# Patient Record
Sex: Male | Born: 1965
Health system: Southern US, Community
[De-identification: ages and names within clinical notes are randomized; demographics above are authoritative.]

## PROBLEM LIST (undated history)

## (undated) DIAGNOSIS — I10 Essential (primary) hypertension: Secondary | ICD-10-CM

## (undated) DIAGNOSIS — Z09 Encounter for follow-up examination after completed treatment for conditions other than malignant neoplasm: Secondary | ICD-10-CM

## (undated) DIAGNOSIS — K222 Esophageal obstruction: Secondary | ICD-10-CM

## (undated) DIAGNOSIS — K635 Polyp of colon: Secondary | ICD-10-CM

## (undated) DIAGNOSIS — K219 Gastro-esophageal reflux disease without esophagitis: Secondary | ICD-10-CM

## (undated) DIAGNOSIS — Z8719 Personal history of other diseases of the digestive system: Secondary | ICD-10-CM

## (undated) DIAGNOSIS — T7840XA Allergy, unspecified, initial encounter: Secondary | ICD-10-CM

## (undated) DIAGNOSIS — E785 Hyperlipidemia, unspecified: Secondary | ICD-10-CM

## (undated) DIAGNOSIS — Z9889 Other specified postprocedural states: Secondary | ICD-10-CM

## (undated) HISTORY — DX: Essential (primary) hypertension: I10

## (undated) HISTORY — DX: Encounter for follow-up examination after completed treatment for conditions other than malignant neoplasm: Z09

## (undated) HISTORY — DX: Gastro-esophageal reflux disease without esophagitis: K21.9

## (undated) HISTORY — DX: Allergy, unspecified, initial encounter: T78.40XA

## (undated) HISTORY — PX: NECK SURGERY: SHX720

## (undated) HISTORY — DX: Hyperlipidemia, unspecified: E78.5

## (undated) HISTORY — DX: Personal history of other diseases of the digestive system: Z87.19

## (undated) HISTORY — DX: Polyp of colon: K63.5

## (undated) HISTORY — PX: POLYPECTOMY: SHX149

## (undated) HISTORY — PX: SHOULDER SURGERY: SHX246

## (undated) HISTORY — PX: COLONOSCOPY: SHX174

## (undated) HISTORY — PX: UPPER GASTROINTESTINAL ENDOSCOPY: SHX188

## (undated) HISTORY — DX: Other specified postprocedural states: Z98.890

## (undated) HISTORY — PX: HERNIA REPAIR: SHX51

## (undated) HISTORY — DX: Esophageal obstruction: K22.2

## (undated) HISTORY — PX: EYE SURGERY: SHX253

## (undated) HISTORY — PX: CATARACT EXTRACTION: SUR2

---

## 1986-08-20 HISTORY — PX: INNER EAR SURGERY: SHX679

## 2003-06-17 LAB — HM DIABETES EYE EXAM

## 2008-08-20 DIAGNOSIS — K635 Polyp of colon: Secondary | ICD-10-CM

## 2008-08-20 HISTORY — DX: Polyp of colon: K63.5

## 2008-11-24 ENCOUNTER — Encounter: Payer: Self-pay | Admitting: Family Medicine

## 2008-12-22 ENCOUNTER — Encounter: Payer: Self-pay | Admitting: Family Medicine

## 2008-12-24 ENCOUNTER — Encounter: Payer: Self-pay | Admitting: Gastroenterology

## 2009-01-06 ENCOUNTER — Ambulatory Visit: Payer: Self-pay | Admitting: Family Medicine

## 2009-01-06 DIAGNOSIS — E1169 Type 2 diabetes mellitus with other specified complication: Secondary | ICD-10-CM | POA: Insufficient documentation

## 2009-01-06 DIAGNOSIS — I10 Essential (primary) hypertension: Secondary | ICD-10-CM | POA: Insufficient documentation

## 2009-01-06 DIAGNOSIS — J309 Allergic rhinitis, unspecified: Secondary | ICD-10-CM | POA: Insufficient documentation

## 2009-01-06 DIAGNOSIS — E785 Hyperlipidemia, unspecified: Secondary | ICD-10-CM

## 2009-01-19 ENCOUNTER — Encounter: Payer: Self-pay | Admitting: Family Medicine

## 2009-01-19 ENCOUNTER — Encounter: Admission: RE | Admit: 2009-01-19 | Discharge: 2009-01-19 | Payer: Self-pay | Admitting: Family Medicine

## 2009-02-11 ENCOUNTER — Ambulatory Visit: Payer: Self-pay | Admitting: Family Medicine

## 2009-03-31 ENCOUNTER — Ambulatory Visit: Payer: Self-pay | Admitting: Gastroenterology

## 2009-03-31 DIAGNOSIS — K219 Gastro-esophageal reflux disease without esophagitis: Secondary | ICD-10-CM | POA: Insufficient documentation

## 2009-03-31 DIAGNOSIS — R1319 Other dysphagia: Secondary | ICD-10-CM | POA: Insufficient documentation

## 2009-04-05 ENCOUNTER — Encounter: Payer: Self-pay | Admitting: Family Medicine

## 2009-04-18 ENCOUNTER — Ambulatory Visit: Payer: Self-pay | Admitting: Family Medicine

## 2009-04-20 HISTORY — PX: ESOPHAGOGASTRODUODENOSCOPY: SHX1529

## 2009-04-20 LAB — HM DIABETES EYE EXAM: HM Diabetic Eye Exam: NORMAL

## 2009-04-21 ENCOUNTER — Ambulatory Visit: Payer: Self-pay | Admitting: Family Medicine

## 2009-04-22 ENCOUNTER — Encounter: Payer: Self-pay | Admitting: Family Medicine

## 2009-04-26 ENCOUNTER — Ambulatory Visit: Payer: Self-pay | Admitting: Family Medicine

## 2009-04-28 ENCOUNTER — Ambulatory Visit: Payer: Self-pay | Admitting: Gastroenterology

## 2009-04-28 ENCOUNTER — Encounter: Payer: Self-pay | Admitting: Gastroenterology

## 2009-04-29 ENCOUNTER — Encounter (INDEPENDENT_AMBULATORY_CARE_PROVIDER_SITE_OTHER): Payer: Self-pay

## 2009-04-29 ENCOUNTER — Encounter: Payer: Self-pay | Admitting: Gastroenterology

## 2009-05-31 ENCOUNTER — Encounter: Payer: Self-pay | Admitting: Family Medicine

## 2009-05-31 ENCOUNTER — Ambulatory Visit: Payer: Self-pay | Admitting: Gastroenterology

## 2009-06-27 ENCOUNTER — Ambulatory Visit: Payer: Self-pay | Admitting: Gastroenterology

## 2009-06-27 DIAGNOSIS — K222 Esophageal obstruction: Secondary | ICD-10-CM | POA: Insufficient documentation

## 2009-07-08 ENCOUNTER — Ambulatory Visit: Payer: Self-pay | Admitting: Family Medicine

## 2009-07-09 LAB — CONVERTED CEMR LAB
ALT: 29 units/L (ref 0–53)
AST: 28 units/L (ref 0–37)
Albumin: 4.1 g/dL (ref 3.5–5.2)
Glucose, Bld: 204 mg/dL — ABNORMAL HIGH (ref 70–99)
HDL: 56.6 mg/dL (ref >39.00)
Phosphorus: 3.2 mg/dL (ref 2.3–4.6)
Sodium: 138 meq/L (ref 135–145)
Triglycerides: 74 mg/dL (ref 0.0–149.0)

## 2009-07-13 ENCOUNTER — Ambulatory Visit: Payer: Self-pay | Admitting: Family Medicine

## 2009-07-13 LAB — HM DIABETES FOOT EXAM

## 2009-10-07 ENCOUNTER — Ambulatory Visit: Payer: Self-pay | Admitting: Family Medicine

## 2009-10-07 DIAGNOSIS — L0211 Cutaneous abscess of neck: Secondary | ICD-10-CM | POA: Insufficient documentation

## 2009-10-07 DIAGNOSIS — D487 Neoplasm of uncertain behavior of other specified sites: Secondary | ICD-10-CM | POA: Insufficient documentation

## 2009-10-07 DIAGNOSIS — L03221 Cellulitis of neck: Secondary | ICD-10-CM

## 2009-10-11 ENCOUNTER — Ambulatory Visit: Payer: Self-pay | Admitting: Family Medicine

## 2009-10-12 ENCOUNTER — Encounter: Payer: Self-pay | Admitting: Family Medicine

## 2009-10-13 ENCOUNTER — Ambulatory Visit: Payer: Self-pay | Admitting: Family Medicine

## 2009-10-14 ENCOUNTER — Encounter: Payer: Self-pay | Admitting: Family Medicine

## 2009-10-17 ENCOUNTER — Ambulatory Visit: Payer: Self-pay | Admitting: Family Medicine

## 2009-11-01 ENCOUNTER — Encounter: Payer: Self-pay | Admitting: Family Medicine

## 2009-11-09 ENCOUNTER — Encounter: Payer: Self-pay | Admitting: Family Medicine

## 2009-11-09 ENCOUNTER — Ambulatory Visit: Payer: Self-pay | Admitting: Family Medicine

## 2009-11-09 DIAGNOSIS — M79609 Pain in unspecified limb: Secondary | ICD-10-CM | POA: Insufficient documentation

## 2009-11-09 DIAGNOSIS — M722 Plantar fascial fibromatosis: Secondary | ICD-10-CM | POA: Insufficient documentation

## 2009-11-21 ENCOUNTER — Telehealth: Payer: Self-pay | Admitting: Gastroenterology

## 2009-11-22 ENCOUNTER — Ambulatory Visit: Payer: Self-pay | Admitting: Family Medicine

## 2009-12-26 ENCOUNTER — Ambulatory Visit: Payer: Self-pay | Admitting: Family Medicine

## 2010-02-15 ENCOUNTER — Encounter: Payer: Self-pay | Admitting: Family Medicine

## 2010-04-05 ENCOUNTER — Telehealth (INDEPENDENT_AMBULATORY_CARE_PROVIDER_SITE_OTHER): Payer: Self-pay | Admitting: *Deleted

## 2010-04-25 ENCOUNTER — Encounter (INDEPENDENT_AMBULATORY_CARE_PROVIDER_SITE_OTHER): Payer: Self-pay | Admitting: *Deleted

## 2010-06-14 ENCOUNTER — Encounter: Payer: Self-pay | Admitting: Family Medicine

## 2010-06-20 ENCOUNTER — Encounter: Payer: Self-pay | Admitting: Family Medicine

## 2010-09-13 ENCOUNTER — Telehealth: Payer: Self-pay | Admitting: Family Medicine

## 2010-09-19 NOTE — Assessment & Plan Note (Signed)
Summary: L FOOT PAIN/CLE   Vital Signs:  Patient profile:   45 year old male Height:      68.75 inches Weight:      228.0 pounds BMI:     34.04 Temp:     98.3 degrees F oral Pulse rate:   80 / minute Pulse rhythm:   regular BP sitting:   130 / 80  (left arm) Cuff size:   large  Vitals Entered By: Benny Lennert CMA Duncan Dull) (November 09, 2009 12:17 PM)  History of Present Illness: Chief complaint hurt left ankle last night  PF partial rupture  pleasant 45 year old gentleman who yesterday forcefully inverted his foot while running and playing softball. He was attempting to play the game, and then he was on his toe and felt a ripping popping sensation and some burning pain. He had had pain in his heel and had some significant difficulty in ability to walk. He has minimal swelling medially posteriorly.  REVIEW OF SYSTEMS  GEN: No systemic complaints, no fevers, chills, sweats, or other acute illnesses MSK: Detailed in the HPI GI: tolerating PO intake without difficulty Neuro: No numbness, parasthesias, or tingling associated. Otherwise the pertinent positives of the ROS are noted above.     GEN: Well-developed,well-nourished,in no acute distress; alert,appropriate and cooperative throughout examination HEENT: Normocephalic and atraumatic without obvious abnormalities. No apparent alopecia or balding. Ears, externally no deformities PULM: Breathing comfortably in no respiratory distress EXT: No clubbing, cyanosis, or edema PSYCH: Normally interactive. Cooperative during the interview. Pleasant. Friendly and conversant. Not anxious or depressed appearing. Normal, full affect.    foot and ankle exam: Bilateral There some mild swelling medially just anterior to the calcaneus. Nontender at bilateral malleoli, entire forefoot, nontender at the navicular, nontender at the fifth metatarsal, nontender and cuboid.  Stable anterior border and subtalar tilt test bilaterally. Strength is  preserved throughout.  Lung the medial border of the plantar fascia in the posterior aspect there is exquisite pain and swelling on palpation. There is also pain at the insertion at the calcaneus.  Allergies: 1)  Codeine  Past History:  Past medical, surgical, family and social histories (including risk factors) reviewed, and no changes noted (except as noted below).  Past Medical History: Reviewed history from 06/27/2009 and no changes required. Allergic rhinitis Diabetes mellitus, type II Hyperlipidemia Hypertension GERD Hyperplastic Polyp 2010 Esophageal stricture  GI- Russella Dar  Past Surgical History: Reviewed history from 06/27/2009 and no changes required. hernia repair as a baby  surgery on R ear - for drainage   EGD 9/10 stricture/ gerd colonoscopy polyp 9/10 - re check 5 years   ENT Dr Haroldine Laws   Family History: Reviewed history from 03/31/2009 and no changes required. Family History Breast cancer  (other relative) Cousin Family History of Colon CA 1st degree relative (father) dx'd age 18 Family History Diabetes (other relative, parent) Family History Hypertension Family History Kidney disease (other relative) Family History Ovarian cancer (other relative) Family History of Heart Disease (Grandparent)  Brother with colon polyps mother DM Maunt DM neice DM  cousin M breast cancer   Social History: Reviewed history from 03/31/2009 and no changes required. Occupation: Arts administrator Single Never Smoked Alcohol use-yes- 6 pk evey 3 days  Drug use-no Regular exercise-yes Daily Caffeine Use 1 cup/day   Impression & Recommendations:  Problem # 1:  FOOT PAIN, LEFT (ICD-729.5) Assessment New XR, 3 view, foot series Indication: foot pain Findings: no evidence of acute fracture or dislocation. 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 Additionally there is a calcaneal spur.  I suspect probable partial plantar fascia rupture. The patient was placed in a post-operative shoe and is going to be partial weightbearing on crutches  f/u 3 weeks  Orders: Radiology other (Radiology Other)  Problem # 2:  PLANTAR FASCIITIS, TRAUMATIC (ICD-728.71) partial rupture  His updated medication list for this problem includes:    Naproxen 500 Mg Tabs (Naproxen) .[name]... 1 by mouth two times a day as needed with food  Complete Medication List: 1)  Lipitor 40 Mg Tabs (Atorvastatin calcium) .... Take 1 tablet by mouth once a day 2)  Benicar 40 Mg Tabs (Olmesartan medoxomil) .... Take 1 tablet by mouth once a day 3)  Lantus 100 Unit/ml Soln (Insulin glargine) .... 30-35 units once daily in morning 4)  Naproxen 500 Mg Tabs (Naproxen) .[name].. 1 by mouth two times a day as needed with food 5)  Flexeril 10 Mg Tabs (Cyclobenzaprine hcl) .[name].. 1 by mouth up to three times a day as needed muscle spasm/ body aches 6)  Cialis 20 Mg Tabs (Tadalafil) .... By mouth as directed as needed 7)  Victoza 0.6 Mg/83ml Soln (liraglutide)  .... Inject sq 1.8 mg once daily 8)  Actos 45 Mg Tabs (Pioglitazone hcl) .... Take 1 tablet by mouth once a day 9)  Metformin Hcl 1000 Mg Tabs (Metformin hcl) .... Take 1 tablet by mouth two times a day 10)  Prilosec 40 Mg Cpdr (Omeprazole) .... Take 1 tablet by mouth every morning 30 minutes before breakfast meal. 11)  Flonase 50 Mcg/act Susp (Fluticasone propionate) .... 2 sprays in each nostril once daily as needed 12)  Vicodin 5-500 Mg Tabs (Hydrocodone-acetaminophen) .[name].. 1 by mouth up to every 4-6 hours as needed pain  warn- this may sedate  Patient Instructions: 1)  f/u 3 weeks Prescriptions: VICODIN 5-500 MG TABS (HYDROCODONE-ACETAMINOPHEN) 1 by mouth up to every 4-6 hours as needed pain  warn- this may sedate  #30 x 0   Entered and Authorized by:   Hannah Beat MD   Signed by:    Hannah Beat MD on 11/09/2009   Method used:   Print then Give to Patient   RxID:   [id number]   Current Allergies (reviewed today): CODEINE

## 2010-09-19 NOTE — Letter (Signed)
Summary: Dr.Michael Altheimer,[facility],Note &   Dr.Michael Altheimer,[facility],Note & Blood Sugar Readings   Imported By: Beau Fanny 06/22/2010 08:58:33  _____________________________________________________________________  External Attachment:    Type:   Image     Comment:   External Document  Appended Document: Dr.Michael Altheimer,[facility],Note &     Clinical Lists Changes  Observations: Added new observation of FLU VAX: given (06/20/2010 21:16)       Preventive Care Screening  Last Flu Shot:    Date:  06/20/2010    Results:  given

## 2010-09-19 NOTE — Letter (Signed)
Summary: Out of Work  [facility]  [address] [place], [place] [address]   Phone: [phone]  Fax: [phone]    November 09, 2009   Employee:  Kairon Popov    To Whom It May Concern:   For Medical reasons, please excuse the above named employee from work for the following dates:  Start:   11/09/2009  End:   11/16/2009  If you need additional information, please feel free to contact our office.         Sincerely,    Hannah Beat MD

## 2010-09-19 NOTE — Letter (Signed)
Summary: [facility]  [facility]   Imported By: Lanelle Bal 03/02/2010 10:28:45  _____________________________________________________________________  External Attachment:    Type:   Image     Comment:   External Document

## 2010-09-19 NOTE — Assessment & Plan Note (Signed)
Summary: EARLY NEXT WEEK FOLLOW UP PER DR Laveda Demedeiros/[facility]   Vital Signs:  Patient profile:   45 year old male Height:      68.75 inches Weight:      232 pounds BMI:     34.63 Temp:     99.5 degrees F oral Pulse rate:   88 / minute Pulse rhythm:   regular BP sitting:   120 / 84  (left arm) Cuff size:   large  Vitals Entered By: Lewanda Rife LPN (October 11, 2009 10:15 AM)  History of Present Illness: put on septra for abcess on neck at last visit   is not improved much and really sore   no fever   may be a little softer - but overall still feels firm  no drainage wishes he could pick the scab off of it   Allergies: 1)  Codeine  Past History:  Past Medical History: Last updated: 06/27/2009 Allergic rhinitis Diabetes mellitus, type II Hyperlipidemia Hypertension GERD Hyperplastic Polyp 2010 Esophageal stricture  GI- Russella Dar  Past Surgical History: Last updated: 06/27/2009 hernia repair as a baby  surgery on R ear - for drainage   EGD 9/10 stricture/ gerd colonoscopy polyp 9/10 - re check 5 years   ENT Dr Haroldine Laws   Family History: Last updated: 03/31/2009 Family History Breast cancer  (other relative) Cousin Family History of Colon CA 1st degree relative (father) dx'd age 64 Family History Diabetes (other relative, parent) Family History Hypertension Family History Kidney disease (other relative) Family History Ovarian cancer (other relative) Family History of Heart Disease (Grandparent)  Brother with colon polyps mother DM Maunt DM neice DM  cousin M breast cancer   Social History: Last updated: 03/31/2009 Occupation: Arts administrator Single Never Smoked Alcohol use-yes- 6 pk evey 3 days  Drug use-no Regular exercise-yes Daily Caffeine Use 1 cup/day  Risk Factors: Exercise: yes (01/06/2009)  Risk Factors: Smoking Status: never (01/06/2009)  Review of Systems General:  Denies chills, fatigue, fever, loss of appetite, and malaise. Eyes:   Denies blurring and eye irritation. ENT:  Denies sore throat. CV:  Denies chest pain or discomfort and palpitations. Resp:  Denies cough. GI:  Denies abdominal pain, nausea, and vomiting. Derm:  Complains of lesion(s); denies itching. Neuro:  Denies headaches, numbness, and tingling. Heme:  Denies abnormal bruising and bleeding.  Physical Exam  General:  Well-developed,well-nourished,in no acute distress; alert,appropriate and cooperative throughout examination Head:  normocephalic, atraumatic, and no abnormalities observed.  Eyes:  vision grossly intact, pupils equal, pupils round, and pupils reactive to light.   Neck:  No deformities, masses, or tenderness noted. Heart:  Normal rate and regular rhythm. S1 and S2 normal without gallop, murmur, click, rub or other extra sounds. Skin:  1.5 cm abcess back of neck- not much change from prev exam -- is still quite firm (very slt softer) scab unroofed and scant amt of pus expressed  erythema is unchanged  tender to touch  Cervical Nodes:  No lymphadenopathy noted Psych:  normal affect, talkative and pleasant    Impression & Recommendations:  Problem # 1:  ABSCESS, NECK (ICD-682.1) Assessment Unchanged not better or worse with septra will add augmentin  still too firm for I and D - but scab unroofed and tiny amt of pus obtained for culture - sent adv to update asap if pain / redness or size worsens  f/u thursday -- can attempt to I and D if soft enough  His updated medication list for this problem  includes:    Septra Ds 800-160 Mg Tabs (Sulfamethoxazole-trimethoprim) .[name]... 1 by mouth two times a day for 10 days    Augmentin 875-125 Mg Tabs (Amoxicillin-pot clavulanate) .[name]... 1 by mouth two times a day for 10 days  Orders: T-Culture, Wound ([id number]) Specimen Handling ([id number])  Complete Medication List: 1)  Lipitor 40 Mg Tabs (Atorvastatin calcium) .... Take 1 tablet by mouth once a day 2)  Benicar 40 Mg Tabs (Olmesartan  medoxomil) .... Take 1 tablet by mouth once a day 3)  Lantus 100 Unit/ml Soln (Insulin glargine) .... 30-35 units once daily in morning 4)  Sudafed Pe Sinus/allergy 4-10 Mg Tabs (Chlorpheniramine-phenylephrine) .... Take 1 tablet by mouth two times a day as needed 5)  Naproxen 500 Mg Tabs (Naproxen) .[name].. 1 by mouth two times a day as needed with food 6)  Flexeril 10 Mg Tabs (Cyclobenzaprine hcl) .[name].. 1 by mouth up to three times a day as needed muscle spasm/ body aches 7)  Cialis 20 Mg Tabs (Tadalafil) .... By mouth as directed as needed 8)  Victoza 0.6 Mg/51ml Soln (liraglutide)  .... Inject sq 1.8 mg once daily 9)  Actos 45 Mg Tabs (Pioglitazone hcl) .... Take 1 tablet by mouth once a day 10)  Metformin Hcl 1000 Mg Tabs (Metformin hcl) .... Take 1 tablet by mouth two times a day 11)  Prilosec 40 Mg Cpdr (Omeprazole) .... Take 1 tablet by mouth every morning 30 minutes before breakfast meal. 12)  Flonase 50 Mcg/act Susp (Fluticasone propionate) .... 2 sprays in each nostril once daily as needed 13)  Septra Ds 800-160 Mg Tabs (Sulfamethoxazole-trimethoprim) .[name].. 1 by mouth two times a day for 10 days 14)  Augmentin 875-125 Mg Tabs (Amoxicillin-pot clavulanate) .[name].. 1 by mouth two times a day for 10 days 15)  Vicodin 5-500 Mg Tabs (Hydrocodone-acetaminophen) .[name].. 1 by mouth up to every 4-6 hours as needed pain  warn- this may sedate  Patient Instructions: 1)  continue the septra  2)  add the augmentin  3)  use warm compress and keep area clean  4)  use gauze for drainage 5)  update me if worse or fever  6)  I will call when wound culture returns 7)  follow up with me thursday am  8)  use vicodin with caution - is strong pain medicine  Prescriptions: VICODIN 5-500 MG TABS (HYDROCODONE-ACETAMINOPHEN) 1 by mouth up to every 4-6 hours as needed pain  warn- this may sedate  #30 x 0   Entered and Authorized by:   Judith Part MD   Signed by:   Judith Part MD on 10/11/2009   Method used:    Print then Give to Patient   RxID:   [id number] AUGMENTIN 875-125 MG TABS (AMOXICILLIN-POT CLAVULANATE) 1 by mouth two times a day for 10 days  #20 x 0   Entered and Authorized by:   Judith Part MD   Signed by:   Judith Part MD on 10/11/2009   Method used:   Print then Give to Patient   RxID:   [id number]   Current Allergies (reviewed today): CODEINE

## 2010-09-19 NOTE — Letter (Signed)
Summary: Office Visit Letter  [facility]  [address] [place], [place] [address]   Phone: [phone]  Fax: [phone]      April 25, 2010 MRN: [id number]   Mitchell Palmer [address] [place], [place]  [address]   Dear [name],   According to our records, it is time for you to schedule a follow-up office visit with [place] in November.   At your convenience, please call [phone] (option #2)to schedule an office visit. If you have any questions, concerns, or feel that this letter is in error, we would appreciate your call.   Sincerely,     [facility] [phone]

## 2010-09-19 NOTE — Consult Note (Signed)
Summary: [facility]  [facility]   Imported By: Lanelle Bal 11/11/2009 12:35:52  _____________________________________________________________________  External Attachment:    Type:   Image     Comment:   External Document

## 2010-09-19 NOTE — Letter (Signed)
Summary: Out of Work  [facility]  [address] [place], [place] [address]   Phone: [phone]  Fax: [phone]    November 22, 2009   Employee:  Traycen Niebuhr    To Whom It May Concern:   For Medical reasons, please excuse the above named employee from work for the following dates:  Start:   11/16/3009  End:   11/17/2009  If you need additional information, please feel free to contact our office.         Sincerely,    Hannah Beat MD

## 2010-09-19 NOTE — Progress Notes (Signed)
Summary: NOS fee  Phone Note Call from Patient Call back at [phone]  (cell)   Caller: Patient Call For: Dr. Russella Dar Summary of Call: pt would like to dispute $50 NOS fee for DOS 05/31/2009... states he was never aware of this appt Initial call taken by: Vallarie Mare,  November 21, 2009 10:18 AM  Follow-up for Phone Call        [facility] please route all No Show fee issues to [facility]. She will investigate. Thank you. MS Follow-up by: Meryl Dare MD Clementeen Graham,  November 22, 2009 7:16 PM  Additional Follow-up for Phone Call Additional follow up Details #1::        My apologies, Dr. Russella Dar.  My intention was to send it to [facility]. [facility], sorry for the delay.  Will you follow up with this pt? Additional Follow-up by: Vallarie Mare,  November 23, 2009 8:21 AM    Additional Follow-up for Phone Call Additional follow up Details #2::    11-25-09 335pm. Left message for patient to call back at his earliest convenience.   Shirlee More  11-25-09 443pm. I spoke with patient and patient clearly did not need appt for this date. According to EGD report, Dr's rec is to follow up in 2 months. I let patient know that we will waive NS fee and I will let [employer] know to do a charge correction on this. I apologized for any inconvience this ahs caused. Follow-up by: Zackery Barefoot,  November 25, 2009 4:44 PM

## 2010-09-19 NOTE — Letter (Signed)
Summary: Out of Work  [facility]  [address] [place], [place] [address]   Phone: [phone]  Fax: [phone]    October 13, 2009   Employee:  Tiburcio Bubb    To Whom It May Concern:   For Medical reasons, please excuse the above named employee from work for the following dates:  Start:   10/13/2009  End:   can return 10/18/2009 if he is feeling better   If you need additional information, please feel free to contact our office.         Sincerely,    Judith Part MD

## 2010-09-19 NOTE — Progress Notes (Signed)
Summary: omeprazole  Phone Note Call from Patient Call back at Home Phone [phone]   Caller: Patient Call For: Judith Part MD Summary of Call: Patient is asking if he can get a rx for omeprazole. This was perscribed by Dr. Russella Dar. It is omeprazole 40mg  takes 1 daily. [facility]ses [facility].  Initial call taken by: Melody Comas,  April 05, 2010 10:07 AM  Follow-up for Phone Call        px written on EMR for call in  Follow-up by: Judith Part MD,  April 05, 2010 11:45 AM  Additional Follow-up for Phone Call Additional follow up Details #1::        Rx done, was sent electronically. ....................................................Marland KitchenNatasha Kaysan Peixoto CMA Duncan Dull) April 05, 2010 1:05 PM Advised patient.  ......................................................Marland KitchenNatasha Rinoa Garramone CMA Duncan Dull) April 05, 2010 1:06 PM     Prescriptions: PRILOSEC 40 MG CPDR (OMEPRAZOLE) Take 1 tablet by mouth every morning 30 minutes before breakfast meal.  #30 x 5   Entered by:   Liane Comber CMA (AAMA)   Authorized by:   Judith Part MD   Signed by:   Liane Comber CMA (AAMA) on 04/05/2010   Method used:   [facility]        [facility] [facility]ret[facility])       [address]       [place], [place]  [address]       Ph: [phone] or [phone]       Fax: [phone]   RxID:   [id number]

## 2010-09-19 NOTE — Assessment & Plan Note (Signed)
Summary: 3 week follow up/[facility]   Vital Signs:  Patient profile:   45 year old male Height:      68.75 inches Weight:      230.0 pounds BMI:     34.34 Temp:     98.2 degrees F oral Pulse rate:   80 / minute Pulse rhythm:   regular BP sitting:   130 / 90  (left arm) Cuff size:   large  Vitals Entered By: Benny Lennert CMA Duncan Dull) (November 22, 2009 3:38 PM)  History of Present Illness: Chief complaint 3 week follow up foot pain  45 year old - probable partial plantar fascial rupture at last visit. about 85% better using post-op shoe  had some bruising that resolved   REVIEW OF SYSTEMS  GEN: No systemic complaints, no fevers, chills, sweats, or other acute illnesses MSK: Detailed in the HPI GI: tolerating PO intake without difficulty Neuro: No numbness, parasthesias, or tingling associated. Otherwise the pertinent positives of the ROS are noted above.    GEN: Well-developed,well-nourished,in no acute distress; alert,appropriate and cooperative throughout examination HEENT: Normocephalic and atraumatic without obvious abnormalities. No apparent alopecia or balding. Ears, externally no deformities PULM: Breathing comfortably in no respiratory distress EXT: No clubbing, cyanosis, or edema PSYCH: Normally interactive. Cooperative during the interview. Pleasant. Friendly and conversant. Not anxious or depressed appearing. Normal, full affect.   TTP at calcaneus, milldly at PF insertion  Allergies: 1)  Codeine   Impression & Recommendations:  Problem # 1:  PLANTAR FASCIITIS, TRAUMATIC (ICD-728.71) Assessment Improved  His updated medication list for this problem includes:    Naproxen 500 Mg Tabs (Naproxen) .[name]... 1 by mouth two times a day as needed with food  Complete Medication List: 1)  Lipitor 40 Mg Tabs (Atorvastatin calcium) .... Take 1 tablet by mouth once a day 2)  Benicar 40 Mg Tabs (Olmesartan medoxomil) .... Take 1 tablet by mouth once a day 3)  Lantus 100  Unit/ml Soln (Insulin glargine) .... 30-35 units once daily in morning 4)  Naproxen 500 Mg Tabs (Naproxen) .[name].. 1 by mouth two times a day as needed with food 5)  Flexeril 10 Mg Tabs (Cyclobenzaprine hcl) .[name].. 1 by mouth up to three times a day as needed muscle spasm/ body aches 6)  Cialis 20 Mg Tabs (Tadalafil) .... By mouth as directed as needed 7)  Victoza 0.6 Mg/9ml Soln (liraglutide)  .... Inject sq 1.8 mg once daily 8)  Actos 45 Mg Tabs (Pioglitazone hcl) .... Take 1 tablet by mouth once a day 9)  Metformin Hcl 1000 Mg Tabs (Metformin hcl) .... Take 1 tablet by mouth two times a day 10)  Prilosec 40 Mg Cpdr (Omeprazole) .... Take 1 tablet by mouth every morning 30 minutes before breakfast meal. 11)  Flonase 50 Mcg/act Susp (Fluticasone propionate) .... 2 sprays in each nostril once daily as needed 12)  Vicodin 5-500 Mg Tabs (Hydrocodone-acetaminophen) .[name].. 1 by mouth up to every 4-6 hours as needed pain  warn- this may sedate  Patient Instructions: 1)  Cast shoe for 1 week 2)  then transition out for the next week 3)  Then to regular shoe 4)  f/u 4-5 weeks  Current Allergies (reviewed today): CODEINE

## 2010-09-19 NOTE — Assessment & Plan Note (Signed)
Summary: MONDAY FOLLOW UP/[facility]   Vital Signs:  Patient profile:   45 year old male Height:      68.75 inches Weight:      226.75 pounds BMI:     33.85 Pulse rate:   88 / minute Pulse rhythm:   regular BP sitting:   120 / 82  (left arm) Cuff size:   large  Vitals Entered By: Lewanda Rife LPN (October 17, 2009 12:01 PM)  History of Present Illness: lesion is finally softening up and a little smaller  no fever   is draining some pus  he expressed some himself  newly on levaquin and no problems   both cx reports staph -- has been sensitive to all abx given- rev both of these  Allergies: 1)  Codeine  Past History:  Past Medical History: Last updated: 06/27/2009 Allergic rhinitis Diabetes mellitus, type II Hyperlipidemia Hypertension GERD Hyperplastic Polyp 2010 Esophageal stricture  GI- Russella Dar  Past Surgical History: Last updated: 06/27/2009 hernia repair as a baby  surgery on R ear - for drainage   EGD 9/10 stricture/ gerd colonoscopy polyp 9/10 - re check 5 years   ENT Dr Haroldine Laws   Family History: Last updated: 03/31/2009 Family History Breast cancer  (other relative) Cousin Family History of Colon CA 1st degree relative (father) dx'd age 24 Family History Diabetes (other relative, parent) Family History Hypertension Family History Kidney disease (other relative) Family History Ovarian cancer (other relative) Family History of Heart Disease (Grandparent)  Brother with colon polyps mother DM Maunt DM neice DM  cousin M breast cancer   Social History: Last updated: 03/31/2009 Occupation: Arts administrator Single Never Smoked Alcohol use-yes- 6 pk evey 3 days  Drug use-no Regular exercise-yes Daily Caffeine Use 1 cup/day  Risk Factors: Exercise: yes (01/06/2009)  Risk Factors: Smoking Status: never (01/06/2009)  Review of Systems General:  Denies chills, fatigue, fever, loss of appetite, and malaise. Eyes:  Denies blurring and  discharge. CV:  Denies chest pain or discomfort and palpitations. Derm:  Denies itching and rash. Neuro:  Denies numbness and tingling.  Physical Exam  General:  Well-developed,well-nourished,in no acute distress; alert,appropriate and cooperative throughout examination Head:  normocephalic, atraumatic, and no abnormalities observed.  Neck:  nl rom Skin:  much imp abcess on neck smaller-- 1 cm diam - softer and no drainage less red and much less tender no streaking scab is healing well Cervical Nodes:  No lymphadenopathy noted Psych:  normal affect, talkative and pleasant    Impression & Recommendations:  Problem # 1:  ABSCESS, NECK (ICD-682.1) Assessment Improved much imp now after I and D and levaquin rev both wound cx for staph adv to start using antibact soap for body if tolerated  will update if not resolved at end of levaquin or if worse or fever at any time  His updated medication list for this problem includes:    Septra Ds 800-160 Mg Tabs (Sulfamethoxazole-trimethoprim) .[name]... 1 by mouth two times a day for 10 days    Augmentin 875-125 Mg Tabs (Amoxicillin-pot clavulanate) .[name]... 1 by mouth two times a day for 10 days    Levaquin 500 Mg Tabs (Levofloxacin) .[name]... Take one by mouth once daily for 10 days with food.  Complete Medication List: 1)  Lipitor 40 Mg Tabs (Atorvastatin calcium) .... Take 1 tablet by mouth once a day 2)  Benicar 40 Mg Tabs (Olmesartan medoxomil) .... Take 1 tablet by mouth once a day 3)  Lantus 100 Unit/ml Soln (  Insulin glargine) .... 30-35 units once daily in morning 4)  Sudafed Pe Sinus/allergy 4-10 Mg Tabs (Chlorpheniramine-phenylephrine) .... Take 1 tablet by mouth two times a day as needed 5)  Naproxen 500 Mg Tabs (Naproxen) .[name].. 1 by mouth two times a day as needed with food 6)  Flexeril 10 Mg Tabs (Cyclobenzaprine hcl) .[name].. 1 by mouth up to three times a day as needed muscle spasm/ body aches 7)  Cialis 20 Mg Tabs (Tadalafil) .... By mouth  as directed as needed 8)  Victoza 0.6 Mg/52ml Soln (liraglutide)  .... Inject sq 1.8 mg once daily 9)  Actos 45 Mg Tabs (Pioglitazone hcl) .... Take 1 tablet by mouth once a day 10)  Metformin Hcl 1000 Mg Tabs (Metformin hcl) .... Take 1 tablet by mouth two times a day 11)  Prilosec 40 Mg Cpdr (Omeprazole) .... Take 1 tablet by mouth every morning 30 minutes before breakfast meal. 12)  Flonase 50 Mcg/act Susp (Fluticasone propionate) .... 2 sprays in each nostril once daily as needed 13)  Septra Ds 800-160 Mg Tabs (Sulfamethoxazole-trimethoprim) .[name].. 1 by mouth two times a day for 10 days 14)  Augmentin 875-125 Mg Tabs (Amoxicillin-pot clavulanate) .[name].. 1 by mouth two times a day for 10 days 15)  Vicodin 5-500 Mg Tabs (Hydrocodone-acetaminophen) .[name].. 1 by mouth up to every 4-6 hours as needed pain  warn- this may sedate 16)  Levaquin 500 Mg Tabs (Levofloxacin) .... Take one by mouth once daily for 10 days with food.  Patient Instructions: 1)  finish entire course of levaquin- update me if any problems 2)  if abcess if not much better at the end of that (or worse at any time) please let me know  3)  try an antibacteral soap for body-- and then go back to dove if it is too irritating   Current Allergies (reviewed today): CODEINE

## 2010-09-19 NOTE — Assessment & Plan Note (Signed)
Summary: ?BOIL ON NECK/CLE   Vital Signs:  Patient profile:   45 year old male Height:      68.75 inches Weight:      227.25 pounds BMI:     33.93 Temp:     98.6 degrees F oral Pulse rate:   80 / minute Pulse rhythm:   regular BP sitting:   122 / 84  (left arm) Cuff size:   regular  Vitals Entered By: Lewanda Rife LPN (October 07, 2009 12:03 PM)  History of Present Illness: has area on his neck - painful and blistered up  is really painful  twinge last sat  much worse by tuesday  has been heating up in shower  not draining   not putting anything on it   felt warm last night  - but no fever   has a spot on his arm -- wants checked out by derm   Allergies: 1)  Codeine  Past History:  Past Medical History: Last updated: 06/27/2009 Allergic rhinitis Diabetes mellitus, type II Hyperlipidemia Hypertension GERD Hyperplastic Polyp 2010 Esophageal stricture  GI- Russella Dar  Past Surgical History: Last updated: 06/27/2009 hernia repair as a baby  surgery on R ear - for drainage   EGD 9/10 stricture/ gerd colonoscopy polyp 9/10 - re check 5 years   ENT Dr Haroldine Laws   Family History: Last updated: 03/31/2009 Family History Breast cancer  (other relative) Cousin Family History of Colon CA 1st degree relative (father) dx'd age 74 Family History Diabetes (other relative, parent) Family History Hypertension Family History Kidney disease (other relative) Family History Ovarian cancer (other relative) Family History of Heart Disease (Grandparent)  Brother with colon polyps mother DM Maunt DM neice DM  cousin M breast cancer   Social History: Last updated: 03/31/2009 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 Eyes:  Denies discharge and eye irritation. CV:  Denies chest pain or discomfort and palpitations. Resp:  Denies cough. Derm:  Complains of lesion(s); denies rash. Neuro:  Denies numbness. Heme:  Denies abnormal bruising and bleeding.  Physical Exam  General:  Well-developed,well-nourished,in no acute distress; alert,appropriate and cooperative throughout examination Head:  normocephalic, atraumatic, and no abnormalities observed.  Mouth:  pharynx pink and moist.   Neck:  No deformities, masses, or tenderness noted. Lungs:  Normal respiratory effort, chest expands symmetrically. Lungs are clear to auscultation, no crackles or wheezes. Heart:  RRR no M Skin:  nevus 1/2 cm R upper arm- several shades of brown raised and smooth- does dimple when squeezed   back of neck 1.5 cm firm area of induration and erythema  not fluctuant small scab tender Cervical Nodes:  No lymphadenopathy noted Psych:  normal affect, talkative and pleasant    Impression & Recommendations:  Problem # 1:  ABSCESS, NECK (ICD-682.1) Assessment New small area of cellulitis/abcess - back of neck  firm - no drainage / unable to obt material to cx cover with septra on off chance of mrsa  warm compress/ hygiene disc f/u early next wk update if worse or fever  His updated medication list for this problem includes:    Septra Ds 800-160 Mg Tabs (Sulfamethoxazole-trimethoprim) .[name]... 1 by mouth two times a day for 10 days  Orders: Prescription  Created Electronically [phone])  Problem # 2:  NEOPLASM UNCERTAIN BEHAVIOR OTHER SPEC SITES (ICD-238.8) Assessment: New nevus on arm - firm and multi colored upper R  dermatofibroma is in differential ref to derm Orders: Dermatology Referral (Derma)  Complete Medication List: 1)  Lipitor 40 Mg Tabs (Atorvastatin calcium) .... Take 1 tablet by mouth once a day 2)  Benicar 40 Mg Tabs (Olmesartan medoxomil) .... Take 1 tablet  by mouth once a day 3)  Lantus 100 Unit/ml Soln (Insulin glargine) .... 30-35 units once daily in morning 4)  Sudafed Pe Sinus/allergy 4-10 Mg Tabs (Chlorpheniramine-phenylephrine) .... Take 1 tablet by mouth two times a day as needed 5)  Naproxen 500 Mg Tabs (Naproxen) .[name].. 1 by mouth two times a day as needed with food 6)  Flexeril 10 Mg Tabs (Cyclobenzaprine hcl) .[name].. 1 by mouth up to three times a day as needed muscle spasm/ body aches 7)  Cialis 20 Mg Tabs (Tadalafil) .... By mouth as directed as needed 8)  Victoza 0.6 Mg/82ml Soln (liraglutide)  .... Inject sq 1.8 mg once daily 9)  Actos 45 Mg Tabs (Pioglitazone hcl) .... Take 1 tablet by mouth once a day 10)  Metformin Hcl 1000 Mg Tabs (Metformin hcl) .... Take 1 tablet by mouth two times a day 11)  Prilosec 40 Mg Cpdr (Omeprazole) .... Take 1 tablet by mouth every morning 30 minutes before breakfast meal. 12)  Flonase 50 Mcg/act Susp (Fluticasone propionate) .... 2 sprays in each nostril once daily as needed 13)  Septra Ds 800-160 Mg Tabs (Sulfamethoxazole-trimethoprim) .[name].. 1 by mouth two times a day for 10 days  Patient Instructions: 1)  use warm compress on neck whenever you can  2)  keep very clean with antibacterial soap and water 3)  take the septra DS as directed  4)  update me fever or worse (increase in redness or swelling)  5)  follow up early next week for a re check  6)  we will do derm ref at check out for spot on arm  Prescriptions: SEPTRA DS 800-160 MG TABS (SULFAMETHOXAZOLE-TRIMETHOPRIM) 1 by mouth two times a day for 10 days  #20 x 0   Entered and Authorized by:   Judith Part MD   Signed by:   Judith Part MD on 10/07/2009   Method used:   [facility]        [facility] [facility]retail)       [address]       [place], [place]  [address]       Ph: [phone] or [phone]       Fax: [phone]   RxID:   [id number]   Current Allergies (reviewed today): CODEINE

## 2010-09-19 NOTE — Consult Note (Signed)
Summary: Dr.Michael Altheimer,[facility],Note  Dr.Michael Altheimer,[facility],Note   Imported By: Beau Fanny 11/10/2009 08:32:08  _____________________________________________________________________  External Attachment:    Type:   Image     Comment:   External Document

## 2010-09-19 NOTE — Assessment & Plan Note (Signed)
Summary: 4-5 WEEK FOLLOW UP/[facility]   Vital Signs:  Patient profile:   45 year old male Height:      68.75 inches Weight:      228.8 pounds BMI:     34.16 Temp:     98.5 degrees F oral Pulse rate:   80 / minute Pulse rhythm:   regular BP sitting:   120 / 74  (left arm) Cuff size:   large  Vitals Entered By: Benny Lennert CMA Duncan Dull) (Dec 26, 2009 3:39 PM)  History of Present Illness: 45 year old - probable partial plantar fascial rupture at last visit, approx 6 weeks ago Mostly better, has transitioned out of post-op shoe, not doing rehab currently  had some bruising that resolved   REVIEW OF SYSTEMS  GEN: No systemic complaints, no fevers, chills, sweats, or other acute illnesses MSK: Detailed in the HPI GI: tolerating PO intake without difficulty Neuro: No numbness, parasthesias, or tingling associated. Otherwise the pertinent positives of the ROS are noted above.    GEN: Well-developed,well-nourished,in no acute distress; alert,appropriate and cooperative throughout examination HEENT: Normocephalic and atraumatic without obvious abnormalities. No apparent alopecia or balding. Ears, externally no deformities PULM: Breathing comfortably in no respiratory distress EXT: No clubbing, cyanosis, or edema PSYCH: Normally interactive. Cooperative during the interview. Pleasant. Friendly and conversant. Not anxious or depressed appearing. Normal, full affect.   TTP at calcaneus, milldly at PF insertion  Allergies: 1)  Codeine   Impression & Recommendations:  Problem # 1:  PLANTAR FASCIITIS, TRAUMATIC (ICD-728.71) Assessment Improved We reviewed that stretching is critically important to the treatment of PF. Reviewed footwear. Rigid soles have been shown to help with PF. Reviewed rehab of stretching and calf raises.  Reviewed rehab in general  placed in tuli's heel cups  His updated medication list for this problem includes:    Naproxen 500 Mg Tabs (Naproxen) .[name]... 1 by  mouth two times a day as needed with food  Complete Medication List: 1)  Lipitor 40 Mg Tabs (Atorvastatin calcium) .... Take 1 tablet by mouth once a day 2)  Benicar 40 Mg Tabs (Olmesartan medoxomil) .... Take 1 tablet by mouth once a day 3)  Lantus 100 Unit/ml Soln (Insulin glargine) .... 30-35 units once daily in morning 4)  Naproxen 500 Mg Tabs (Naproxen) .[name].. 1 by mouth two times a day as needed with food 5)  Flexeril 10 Mg Tabs (Cyclobenzaprine hcl) .[name].. 1 by mouth up to three times a day as needed muscle spasm/ body aches 6)  Cialis 20 Mg Tabs (Tadalafil) .... By mouth as directed as needed 7)  Victoza 0.6 Mg/21ml Soln (liraglutide)  .... Inject sq 1.8 mg once daily 8)  Actos 45 Mg Tabs (Pioglitazone hcl) .... Take 1 tablet by mouth once a day 9)  Metformin Hcl 1000 Mg Tabs (Metformin hcl) .... Take 1 tablet by mouth two times a day 10)  Prilosec 40 Mg Cpdr (Omeprazole) .... Take 1 tablet by mouth every morning 30 minutes before breakfast meal. 11)  Flonase 50 Mcg/act Susp (Fluticasone propionate) .... 2 sprays in each nostril once daily as needed  Patient Instructions: 1)  Please read handouts from AAOSM and American Academy of [employer] on Plantar Fascitis. 2)  STRETCHING and Strengthening program critically important. 3)  Strengthening on foot and calf muscles as seen in handout. 4)  Calf raises, 2 legged, then 1 legged. 5)  Foot massage with tennis ball. 6)  Ice massage. 7)  NEEDS  TO BE DONE EVERY DAY 8)  Recommended over the counter insoles. (Spenco or Hapad) 9)  A rigid shoe with good arch support helps: Dansko (great), Randel Pigg, Merrell 10)  No easily bendable shoes.    Current Allergies (reviewed today): CODEINE

## 2010-09-19 NOTE — Miscellaneous (Signed)
Summary: Levaquin 500mg  rx  Clinical Lists Changes  Medications: Added new medication of LEVAQUIN 500 MG TABS (LEVOFLOXACIN) take one by mouth once daily for 10 days with food. - Signed Rx of LEVAQUIN 500 MG TABS (LEVOFLOXACIN) take one by mouth once daily for 10 days with food.;  #10 x 0;  Signed;  Entered by: Lewanda Rife LPN;  Authorized by: Judith Part MD;  Method used: [facility]  [place], [place], [place]  [address], Ph: [phone] or [phone], Fax: [phone]    Prescriptions: LEVAQUIN 500 MG TABS (LEVOFLOXACIN) take one by mouth once daily for 10 days with food.  #10 x 0   Entered by:   Lewanda Rife LPN   Authorized by:   Judith Part MD   Signed by:   Lewanda Rife LPN on 41/32/4401   Method used:   [facility]        [facility] ([facility]ail)       [address]       [place], [place]  [address]       Ph: [phone] or [phone]       Fax: [phone]   RxID:   [id number]   Current Allergies: CODEINE

## 2010-09-19 NOTE — Assessment & Plan Note (Signed)
Summary: FOLLOW UP / [facility]   Vital Signs:  Patient profile:   45 year old male Height:      68.75 inches Weight:      234.50 pounds BMI:     35.01 Temp:     98.7 degrees F oral Pulse rate:   80 / minute Pulse rhythm:   regular BP sitting:   124 / 80  (left arm) Cuff size:   large  Vitals Entered By: Lewanda Rife LPN (October 13, 2009 11:47 AM)  History of Present Illness: some less pain today   ? if any softer   little drainage on his pillow  no fever- feels fine the pain med is helping a lot   attempts to do warm compresses   Allergies: 1)  Codeine  Past History:  Past Medical History: Last updated: 06/27/2009 Allergic rhinitis Diabetes mellitus, type II Hyperlipidemia Hypertension GERD Hyperplastic Polyp 2010 Esophageal stricture  GI- Russella Dar  Past Surgical History: Last updated: 06/27/2009 hernia repair as a baby  surgery on R ear - for drainage   EGD 9/10 stricture/ gerd colonoscopy polyp 9/10 - re check 5 years   ENT Dr Haroldine Laws   Family History: Last updated: 03/31/2009 Family History Breast cancer  (other relative) Cousin Family History of Colon CA 1st degree relative (father) dx'd age 77 Family History Diabetes (other relative, parent) Family History Hypertension Family History Kidney disease (other relative) Family History Ovarian cancer (other relative) Family History of Heart Disease (Grandparent)  Brother with colon polyps mother DM Maunt DM neice DM  cousin M breast cancer   Social History: Last updated: 03/31/2009 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 Eyes:  Denies blurring and discharge. CV:  Denies chest pain or discomfort. Resp:  Denies cough. GI:   Denies diarrhea, nausea, and vomiting. Derm:  Complains of lesion(s); denies itching. Neuro:  Denies headaches, numbness, and tingling. Heme:  Denies abnormal bruising and bleeding.  Physical Exam  General:  Well-developed,well-nourished,in no acute distress; alert,appropriate and cooperative throughout examination Head:  normocephalic, atraumatic, and no abnormalities observed.  Eyes:  vision grossly intact, pupils equal, pupils round, and pupils reactive to light.   Skin:  abcess on neck is softer with several pustules prepped and draped in sterile fashoin and aneth with 2% xylocaine with epi (pt tolerated well)  small area 1 cm incised and pus expressed with some relief sterile dressing applied with triple abx oint   Cervical Nodes:  No lymphadenopathy noted Psych:  normal affect, talkative and pleasant    Impression & Recommendations:  Problem # 1:  ABSCESS, NECK (ICD-682.1) Assessment Improved softer today- able to partially I and D  pus expressed and sent again for cx (still pend final report from prev) continue septra and augmentin warm compresses/ enc drainage pain med no change work note till tues f/u mon  update asap if worse / pain / size or fever His updated medication list for this problem includes:    Septra Ds 800-160 Mg Tabs (Sulfamethoxazole-trimethoprim) .[name]... 1 by mouth two times a day for 10 days    Augmentin 875-125 Mg Tabs (Amoxicillin-pot clavulanate) .[name]... 1 by mouth two times a day for 10 days  Orders: T-Culture, Wound ([id number]) Specimen Handling (41660) I&D Abscess, Simple / Single (  10060)  Complete Medication List: 1)  Lipitor 40 Mg Tabs (Atorvastatin calcium) .... Take 1 tablet by mouth once a day 2)  Benicar 40 Mg Tabs (Olmesartan medoxomil) .... Take 1 tablet by mouth once a day 3)  Lantus 100 Unit/ml Soln (Insulin glargine) .... 30-35 units once daily in morning 4)  Sudafed Pe Sinus/allergy 4-10 Mg Tabs  (Chlorpheniramine-phenylephrine) .... Take 1 tablet by mouth two times a day as needed 5)  Naproxen 500 Mg Tabs (Naproxen) .[name].. 1 by mouth two times a day as needed with food 6)  Flexeril 10 Mg Tabs (Cyclobenzaprine hcl) .[name].. 1 by mouth up to three times a day as needed muscle spasm/ body aches 7)  Cialis 20 Mg Tabs (Tadalafil) .... By mouth as directed as needed 8)  Victoza 0.6 Mg/54ml Soln (liraglutide)  .... Inject sq 1.8 mg once daily 9)  Actos 45 Mg Tabs (Pioglitazone hcl) .... Take 1 tablet by mouth once a day 10)  Metformin Hcl 1000 Mg Tabs (Metformin hcl) .... Take 1 tablet by mouth two times a day 11)  Prilosec 40 Mg Cpdr (Omeprazole) .... Take 1 tablet by mouth every morning 30 minutes before breakfast meal. 12)  Flonase 50 Mcg/act Susp (Fluticasone propionate) .... 2 sprays in each nostril once daily as needed 13)  Septra Ds 800-160 Mg Tabs (Sulfamethoxazole-trimethoprim) .[name].. 1 by mouth two times a day for 10 days 14)  Augmentin 875-125 Mg Tabs (Amoxicillin-pot clavulanate) .[name].. 1 by mouth two times a day for 10 days 15)  Vicodin 5-500 Mg Tabs (Hydrocodone-acetaminophen) .[name].. 1 by mouth up to every 4-6 hours as needed pain  warn- this may sedate  Patient Instructions: 1)  continue the antibiotics 2)  keep wound clean - I do expect it to drain  3)  update me if more pain or any fever or worse  4)  follow up with me monday  Current Allergies (reviewed today): CODEINE

## 2010-09-21 NOTE — Progress Notes (Signed)
Summary: flexeril   Phone Note Refill Request Call back at Home Phone [phone] Call back at [phone] Message from:  Patient on September 13, 2010 3:11 PM  Refills Requested: Medication #1:  FLEXERIL 10 MG TABS 1 by mouth up to three times a day as needed muscle spasm/ body aches [phone]. 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   Initial call taken by: Melody Comas,  September 13, 2010 3:12 PM  Follow-up for Phone Call        px written on EMR for call in  Follow-up by: Judith Part MD,  September 13, 2010 4:26 PM  Additional Follow-up for Phone Call Additional follow up Details #1::        Med sent electronically to [facility]. Patient notified as instructed by telephone. Lewanda Rife LPN  September 13, 2010 4:46 PM     New/Updated Medications: FLEXERIL 10 MG TABS (CYCLOBENZAPRINE HCL) 1 by mouth up to three times a day as needed muscle spasm/ body aches Prescriptions: FLEXERIL 10 MG TABS (CYCLOBENZAPRINE HCL) 1 by mouth up to three times a day as needed muscle spasm/ body aches  #30 x 0   Entered by:   Lewanda Rife LPN   Authorized by:   Judith Part MD   Signed by:   Lewanda Rife LPN on 62/95/2841   Method used:   [facility] to        [facility]  [place] (retail)       [address]       [place], [place]  [address]       Ph: [phone] or [phone]       Fax: [phone]   RxID:   [id number]

## 2010-11-24 LAB — GLUCOSE, CAPILLARY
Glucose-Capillary: 165 mg/dL — ABNORMAL HIGH (ref 70–99)
Glucose-Capillary: 169 mg/dL — ABNORMAL HIGH (ref 70–99)

## 2010-12-06 ENCOUNTER — Other Ambulatory Visit: Payer: Self-pay | Admitting: *Deleted

## 2010-12-06 MED ORDER — ATORVASTATIN CALCIUM 40 MG PO TABS: 40.0000 mg | ORAL_TABLET | Freq: Every day | ORAL | Status: DC

## 2011-01-02 ENCOUNTER — Other Ambulatory Visit: Payer: Self-pay | Admitting: *Deleted

## 2011-01-02 MED ORDER — CYCLOBENZAPRINE HCL 10 MG PO TABS: ORAL_TABLET | ORAL | Status: DC

## 2011-01-02 NOTE — Telephone Encounter (Signed)
Medication phoned to [facility] pharmacy as instructed. To attach note to rx pt needs to call for appt this summer.

## 2011-01-02 NOTE — Telephone Encounter (Signed)
Needs f/u this summer ? If seen in over a year Px written for call in

## 2011-01-08 ENCOUNTER — Ambulatory Visit (INDEPENDENT_AMBULATORY_CARE_PROVIDER_SITE_OTHER): Payer: 59 | Admitting: Family Medicine

## 2011-01-08 ENCOUNTER — Encounter: Payer: Self-pay | Admitting: Family Medicine

## 2011-01-08 VITALS — BP 120/70 | HR 68 | Temp 97.4°F | Ht 69.0 in | Wt 243.0 lb

## 2011-01-08 DIAGNOSIS — L03221 Cellulitis of neck: Secondary | ICD-10-CM

## 2011-01-08 DIAGNOSIS — L0211 Cutaneous abscess of neck: Secondary | ICD-10-CM

## 2011-01-08 MED ORDER — HYDROCODONE-ACETAMINOPHEN 5-500 MG PO TABS: 1.0000 | ORAL_TABLET | Freq: Four times a day (QID) | ORAL | Status: DC | PRN

## 2011-01-08 MED ORDER — AMOXICILLIN-POT CLAVULANATE 875-125 MG PO TABS: 1.0000 | ORAL_TABLET | Freq: Two times a day (BID) | ORAL | Status: AC

## 2011-01-08 MED ORDER — SULFAMETHOXAZOLE-TRIMETHOPRIM 800-160 MG PO TABS: 1.0000 | ORAL_TABLET | Freq: Two times a day (BID) | ORAL | Status: AC

## 2011-01-08 NOTE — Assessment & Plan Note (Signed)
2 areas on lateral neck (each side) - firm/ red/ indurated (in a diabetic) Has had staph in past  Too firm to I and D No systemic symptoms  tx with augmentin and septra both  Warm comp/ do not shave/ antibact soap F/u mid to late week

## 2011-01-08 NOTE — Patient Instructions (Signed)
Keep using warm compress on areas on neck Do not shave  Take augmentin and septra as directed (can take with food)  Update me asap if fever or worse redness or swelling  Wash with antibacterial soap and water  Follow up wed afternoon or Friday  If you are much better - can cancel the appt I sent px to the pharmacy

## 2011-01-08 NOTE — Progress Notes (Signed)
Subjective:    Patient ID: Mitchell Palmer, male    DOB: 1965/10/19, 45 y.o.   MRN: [id number]  HPI ?boil on neck-- started Thursday (shaved tues) - did work hard outside and sweat for next 2 days  Felt sore- on both sides and lump behind L ear   Using warm compress Had left over augmentin -- and started taking it - ? Kept it from getting worse Not draining - still firm  No fever  Feels blah  Using some warm compresses Has had these on his neck before in the back- is prone to them (non mrsa staph in the past)  Past Medical History  Diagnosis Date  . Allergy     allegic rhinitis  . Diabetes mellitus     type II  . Hyperlipidemia   . Hypertension   . GERD (gastroesophageal reflux disease)   . Hyperplastic polyp of intestine 2010  . Esophageal stricture   . History of hernia repair     as a baby  . S/P ear surgery, follow-up exam     for drainage    History   Social History  . Marital Status: Single    Spouse Name: N/A    Number of Children: N/A  . Years of Education: N/A   Occupational History  . Not on file.   Social History Main Topics  . Smoking status: Never Smoker   . Smokeless tobacco: Not on file  . Alcohol Use: Yes     6 pk every 3 days; 12 pk/week  . Drug Use: No  . Sexually Active: Not on file   Other Topics Concern  . Not on file   Social History Narrative  . No narrative on file    Allergies  Allergen Reactions  . Codeine     REACTION: anxious      Review of Systems Review of Systems  Constitutional: Negative for fever, appetite change,  and unexpected weight change. pos for fatigue Eyes: Negative for pain and visual disturbance.  Respiratory: Negative for cough and shortness of breath.   Cardiovascular: Negative.for cp or sob    Gastrointestinal: Negative for nausea, diarrhea and constipation.  Genitourinary: Negative for urgency and frequency.  Skin: Negative for pallor. pos for abcess with some redness  Neurological: Negative for  weakness, light-headedness, numbness and headaches.  Hematological: Negative for adenopathy. Does not bruise/bleed easily.  Psychiatric/Behavioral: Negative for dysphoric mood. The patient is not nervous/anxious.          Objective:   Physical Exam  Constitutional: He appears well-developed and well-nourished. No distress.       overwt and well appearing   HENT:  Head: Normocephalic and atraumatic.  Right Ear: External ear normal.  Left Ear: External ear normal.  Nose: Nose normal.  Mouth/Throat: Oropharynx is clear and moist.  Eyes: Conjunctivae and EOM are normal. Pupils are equal, round, and reactive to light.  Neck: Normal range of motion. Neck supple. No JVD present. Carotid bruit is not present. No thyromegaly present.  Cardiovascular: Normal rate, regular rhythm and normal heart sounds.   Pulmonary/Chest: Effort normal and breath sounds normal. No respiratory distress. He has no wheezes.  Lymphadenopathy:    He has cervical adenopathy.  Skin:       Oval firm areas of induration on each side of neck under mandible Non fluctuant Scabs without drainage slt redness/ slt tenderness  One swollen LN below L ear- is mobile  Psychiatric: He has a normal mood and affect.  Assessment & Plan:

## 2011-01-10 ENCOUNTER — Encounter: Payer: Self-pay | Admitting: Family Medicine

## 2011-01-12 ENCOUNTER — Encounter: Payer: Self-pay | Admitting: Family Medicine

## 2011-01-12 ENCOUNTER — Ambulatory Visit (INDEPENDENT_AMBULATORY_CARE_PROVIDER_SITE_OTHER): Payer: 59 | Admitting: Family Medicine

## 2011-01-12 VITALS — BP 118/72 | HR 80 | Temp 97.9°F | Ht 69.0 in | Wt 245.0 lb

## 2011-01-12 DIAGNOSIS — L0211 Cutaneous abscess of neck: Secondary | ICD-10-CM

## 2011-01-12 DIAGNOSIS — Z23 Encounter for immunization: Secondary | ICD-10-CM

## 2011-01-12 DIAGNOSIS — L03221 Cellulitis of neck: Secondary | ICD-10-CM

## 2011-01-12 MED ORDER — OMEPRAZOLE 40 MG PO CPDR: 40.0000 mg | DELAYED_RELEASE_CAPSULE | Freq: Every day | ORAL | Status: DC

## 2011-01-12 NOTE — Progress Notes (Signed)
  Subjective:    Patient ID: Mitchell Palmer, male    DOB: 1966/04/29, 45 y.o.   MRN: [id number]  HPI Here for follow up of abcess on neck On augmentin and sulfa currently (this recurrent, worked in past )  Pt is diabetic   Some drainage on R abcess- some pus  L one has not drained  Not a lot of pain unless he touches them  No fever  Feeling ok in general    Review of Systems Review of Systems  Constitutional: Negative for fever, appetite change, fatigue and unexpected weight change.  Eyes: Negative for pain and visual disturbance.  Respiratory: Negative for cough and shortness of breath.   Cardiovascular: Negative for cp or papp.   Gastrointestinal: Negative for nausea, diarrhea and constipation.  Genitourinary: Negative for urgency and frequency.  Skin: Negative for pallor.pos for lesions  Neurological: Negative for weakness, light-headedness, numbness and headaches.  Hematological: Negative for adenopathy. Does not bruise/bleed easily.  Psychiatric/Behavioral: Negative for dysphoric mood. The patient is not nervous/anxious.          Objective:   Physical Exam  Constitutional: He appears well-developed and well-nourished. No distress.       overwt and well appearing   HENT:  Head: Atraumatic.  Nose: Nose normal.  Mouth/Throat: Oropharynx is clear and moist.  Eyes: EOM are normal. Pupils are equal, round, and reactive to light. Right eye exhibits no discharge. Left eye exhibits no discharge.  Neck: Normal range of motion. Neck supple. No thyromegaly present.  Cardiovascular: Normal rate, regular rhythm and normal heart sounds.   Pulmonary/Chest: Effort normal and breath sounds normal.  Lymphadenopathy:    He has no cervical adenopathy.  Skin: No pallor.       abcess on L side of neck is smaller/ less red and with 2-3 mm scab- firm in consistency abcess on R side- softer/ smaller and also less red Some bloody drainage - sent for cx A bit of yellow healing granulation  tissue This was cleaned with alcohol and dressed with bactroban and bandaid (pt tol well)    Psychiatric: He has a normal mood and affect.          Assessment & Plan:

## 2011-01-12 NOTE — Patient Instructions (Signed)
Finish course of both antibioitics  Update me if worse redness/ swelling or increase in size  Cover if draining or risk of getting dirty  tetnus shot Tdap

## 2011-01-15 LAB — WOUND CULTURE: Gram Stain: NONE SEEN

## 2011-01-16 ENCOUNTER — Ambulatory Visit: Payer: 59 | Admitting: Family Medicine

## 2011-03-09 ENCOUNTER — Other Ambulatory Visit: Payer: Self-pay | Admitting: Family Medicine

## 2011-03-09 NOTE — Telephone Encounter (Signed)
[facility] electronically request refill for Lipitor 40mg  #30 x 1 with note pt needs to call for appt.

## 2011-05-11 ENCOUNTER — Other Ambulatory Visit: Payer: Self-pay | Admitting: Family Medicine

## 2011-05-11 NOTE — Telephone Encounter (Signed)
[facility] electronically request refill Lipitor 40 mg # 30 x 0 with note pt needs to call for appt.

## 2011-06-12 ENCOUNTER — Other Ambulatory Visit: Payer: Self-pay | Admitting: *Deleted

## 2011-06-12 MED ORDER — ATORVASTATIN CALCIUM 40 MG PO TABS: ORAL_TABLET | ORAL | Status: DC

## 2011-06-15 ENCOUNTER — Encounter: Payer: Self-pay | Admitting: Family Medicine

## 2011-06-15 ENCOUNTER — Ambulatory Visit (INDEPENDENT_AMBULATORY_CARE_PROVIDER_SITE_OTHER): Payer: 59 | Admitting: Family Medicine

## 2011-06-15 VITALS — BP 130/76 | HR 76 | Temp 98.1°F | Ht 69.0 in | Wt 252.0 lb

## 2011-06-15 DIAGNOSIS — E785 Hyperlipidemia, unspecified: Secondary | ICD-10-CM

## 2011-06-15 DIAGNOSIS — I10 Essential (primary) hypertension: Secondary | ICD-10-CM

## 2011-06-15 DIAGNOSIS — E119 Type 2 diabetes mellitus without complications: Secondary | ICD-10-CM

## 2011-06-15 MED ORDER — TADALAFIL 20 MG PO TABS: 20.0000 mg | ORAL_TABLET | ORAL | Status: DC

## 2011-06-15 MED ORDER — ATORVASTATIN CALCIUM 40 MG PO TABS: ORAL_TABLET | ORAL | Status: DC

## 2011-06-15 NOTE — Progress Notes (Signed)
Subjective:    Patient ID: Mitchell Palmer, male    DOB: September 24, 1965, 45 y.o.   MRN: [id number]  HPI Here for f/u of chronic med problems   DM is doing better- ? What a1c was - but doing better -- looked up 8.1 in 9/12  Is trying to eat Dm diet -- about the same  Did gain a little weight  Not enough energy to go out and exercise so he started him on oral B12 1000 mcg daily to see if it helps  Continues to see endocrine   Already had flu shot -- last mo  Has not had his yearly eye exam -- Dr Dione Booze   Need to check on cholesterol  Has been good -- at his last office - has been great - no problems  Last LDL with Dr Altheimer was 37 - fairly stable - in very good control   130/76 blood pressure today  Was higher at endo last visit -- ? 150/90 -- may have been from caffene  No cp or ha or palpitations or edema  Reassuring it is lower today  Does know he needs to loose weight  Is not very motivated No exercise Does try to stick to DM diet   Patient Active Problem List  Diagnoses  . NEOPLASM UNCERTAIN BEHAVIOR OTHER SPEC SITES  . DIABETES MELLITUS, TYPE II  . HYPERLIPIDEMIA  . HYPERTENSION  . ALLERGIC RHINITIS  . ESOPHAGEAL STRICTURE  . GERD  . ABSCESS, NECK  . PLANTAR FASCIITIS, TRAUMATIC  . FOOT PAIN, LEFT  . OTHER DYSPHAGIA   Past Medical History  Diagnosis Date  . Allergy     allegic rhinitis  . Diabetes mellitus     type II  . Hyperlipidemia   . Hypertension   . GERD (gastroesophageal reflux disease)   . Hyperplastic polyp of intestine 2010  . Esophageal stricture   . History of hernia repair     as a baby  . S/P ear surgery, follow-up exam     for drainage   Past Surgical History  Procedure Date  . Esophagogastroduodenoscopy 04/2009    stricture/GERD   History  Substance Use Topics  . Smoking status: Never Smoker   . Smokeless tobacco: Not on file  . Alcohol Use: Yes     6 pk every 3 days; 12 pk/week   Family History  Problem Relation Age of Onset   . Diabetes Mother   . Cancer Father 28    colon cancer  . Diabetes Maternal Aunt   . Cancer Cousin     breast cancer  . Diabetes Other    Allergies  Allergen Reactions  . Codeine     REACTION: anxious   Current Outpatient Prescriptions on File Prior to Visit  Medication Sig Dispense Refill  . insulin glargine (LANTUS) 100 UNIT/ML injection Inject 60 Units into the skin every morning.       . Liraglutide (VICTOZA [place]) Inject 1.8 mg into the skin daily. Victoza 0.6mg /59ml soln       . metFORMIN (GLUCOPHAGE) 1000 MG tablet Take 1,000 mg by mouth 2 (two) times daily with a meal.        . olmesartan (BENICAR) 40 MG tablet Take 40 mg by mouth daily.        [name] omeprazole (PRILOSEC) 40 MG capsule Take 1 capsule (40 mg total) by mouth daily. Every morning 30 minutes before breakfast meal.  30 capsule  11  . pioglitazone (ACTOS) 45 MG tablet  Take 45 mg by mouth daily.        . cyclobenzaprine (FLEXERIL) 10 MG tablet Take 1 tablet by mouth up to 3 times a day as needed for muscle spasm/body aches  30 tablet  0  . fluticasone (FLONASE) 50 MCG/ACT nasal spray 2 sprays by Nasal route daily as needed.        [name] HYDROcodone-acetaminophen (VICODIN) 5-500 MG per tablet Take 1 tablet by mouth every 6 (six) hours as needed for pain (careful of sedation).  20 tablet  0  . naproxen (NAPROSYN) 500 MG tablet Take 500 mg by mouth 2 (two) times daily as needed. With food           Review of Systems Review of Systems  Constitutional: Negative for fever, appetite change, fatigue and unexpected weight change.  Eyes: Negative for pain and visual disturbance.  Respiratory: Negative for cough and shortness of breath.   Cardiovascular: Negative for cp or palpitations    Gastrointestinal: Negative for nausea, diarrhea and constipation.  Genitourinary: Negative for urgency and frequency.  Skin: Negative for pallor or rash   Neurological: Negative for weakness, light-headedness, numbness and headaches.   Hematological: Negative for adenopathy. Does not bruise/bleed easily.  Psychiatric/Behavioral: Negative for dysphoric mood. The patient is not nervous/anxious.          Objective:   Physical Exam  Constitutional: He appears well-developed and well-nourished. No distress.       overwt and well appearing   HENT:  Head: Normocephalic and atraumatic.  Mouth/Throat: Oropharynx is clear and moist.  Eyes: Conjunctivae and EOM are normal. Pupils are equal, round, and reactive to light. No scleral icterus.  Neck: Normal range of motion. Neck supple. No JVD present. Carotid bruit is not present. No thyromegaly present.  Cardiovascular: Normal rate, regular rhythm, normal heart sounds and intact distal pulses.  Exam reveals no gallop.   Pulmonary/Chest: Effort normal and breath sounds normal. No respiratory distress. He has no wheezes.  Abdominal: Soft. Bowel sounds are normal. He exhibits no distension and no mass. There is no tenderness.  Musculoskeletal: He exhibits no edema and no tenderness.  Lymphadenopathy:    He has no cervical adenopathy.  Neurological: He is alert. No cranial nerve deficit. Coordination normal.  Skin: Skin is warm and dry. No rash noted. No erythema. No pallor.  Psychiatric: He has a normal mood and affect.          Assessment & Plan:

## 2011-06-15 NOTE — Patient Instructions (Signed)
I'm glad you are doing well  Keep up your diabetic follow ups  Work on getting some more exercise  Cholesterol looks good  I sent medicines to your pharmacy  Blood pressure is better today  Don't forget to schedule your eye appt

## 2011-06-17 NOTE — Assessment & Plan Note (Signed)
Good control on lipitor 40  No side eff Rev labs with pt from Dr Altheimer  Rev low sat fat diet

## 2011-06-17 NOTE — Assessment & Plan Note (Signed)
Control is gradually  Improving under care of endo  a1c needs more improvement  Stressed imp of diet and exercise and wt loss Will make his eye doc appt  Continue endo f/u

## 2011-06-17 NOTE — Assessment & Plan Note (Signed)
Though high at last appt - ok here today Stressed imp of diet and exercise  Continue to monitor  Continue ARB - refil

## 2011-07-09 ENCOUNTER — Other Ambulatory Visit: Payer: Self-pay | Admitting: Family Medicine

## 2011-07-13 ENCOUNTER — Other Ambulatory Visit: Payer: Self-pay | Admitting: *Deleted

## 2011-07-13 MED ORDER — CYCLOBENZAPRINE HCL 10 MG PO TABS: ORAL_TABLET | ORAL | Status: DC

## 2011-07-13 NOTE — Telephone Encounter (Signed)
Faxed request from [facility] [address], last filled 01/02/11.

## 2011-07-13 NOTE — Telephone Encounter (Signed)
Will refill electronically  

## 2011-10-12 ENCOUNTER — Ambulatory Visit (INDEPENDENT_AMBULATORY_CARE_PROVIDER_SITE_OTHER): Payer: 59 | Admitting: Family Medicine

## 2011-10-12 ENCOUNTER — Encounter: Payer: Self-pay | Admitting: Family Medicine

## 2011-10-12 ENCOUNTER — Ambulatory Visit (INDEPENDENT_AMBULATORY_CARE_PROVIDER_SITE_OTHER)
Admission: RE | Admit: 2011-10-12 | Discharge: 2011-10-12 | Disposition: A | Discharge status: Home / Self Care | Payer: 59 | Source: Ambulatory Visit | Attending: Family Medicine | Admitting: Family Medicine

## 2011-10-12 VITALS — BP 158/88 | HR 80 | Temp 97.9°F | Ht 69.0 in | Wt 256.2 lb

## 2011-10-12 DIAGNOSIS — M25511 Pain in right shoulder: Secondary | ICD-10-CM

## 2011-10-12 DIAGNOSIS — Z23 Encounter for immunization: Secondary | ICD-10-CM

## 2011-10-12 DIAGNOSIS — M25519 Pain in unspecified shoulder: Secondary | ICD-10-CM

## 2011-10-12 MED ORDER — NAPROXEN 500 MG PO TABS: 500.0000 mg | ORAL_TABLET | Freq: Two times a day (BID) | ORAL | Status: DC | PRN

## 2011-10-12 MED ORDER — HYDROCODONE-ACETAMINOPHEN 5-500 MG PO TABS: 1.0000 | ORAL_TABLET | Freq: Four times a day (QID) | ORAL | Status: DC | PRN

## 2011-10-12 NOTE — Patient Instructions (Addendum)
X ray of shoulder today-and we will update you  I am wondering about muscular problem vs rotator cuff tendonitis  Take your naproxen- but stop and update me if bp at home is over 140/90  Use vicodin only for severe pain  Use heat 10 minutes at a time

## 2011-10-12 NOTE — Progress Notes (Signed)
Subjective:    Patient ID: Mitchell Palmer, male    DOB: 1966-05-04, 46 y.o.   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 life - pitching    played ball this summer- longer to warm up  Now cannot throw ball overhand  Sometimes he can just "pop" it - now cannot get that release on it   As a child was told he had a normal variant -- scapula has a curve - makes it stick out more   Hurts on top and back of shoulder No swelling or redness Pain is very sharp -- when in certain position or movement- extend and abduct  Pain does not travel down arm  No numbness or weakness  Is R handed   Not taking naprosyn  Has bp cuff at home     Now pain is worse with movement- in any direction  Patient Active Problem List  Diagnoses  . NEOPLASM UNCERTAIN BEHAVIOR OTHER SPEC SITES  . DIABETES MELLITUS, TYPE II  . HYPERLIPIDEMIA  . HYPERTENSION  . ALLERGIC RHINITIS  . ESOPHAGEAL STRICTURE  . GERD  . ABSCESS, NECK  . PLANTAR FASCIITIS, TRAUMATIC  . FOOT PAIN, LEFT  . OTHER DYSPHAGIA  . Right shoulder pain   Past Medical History  Diagnosis Date  . Allergy     allegic rhinitis  . Diabetes mellitus     type II  . Hyperlipidemia   . Hypertension   . GERD (gastroesophageal reflux disease)   . Hyperplastic polyp of intestine 2010  . Esophageal stricture   . History of hernia repair     as a baby  . S/P ear surgery, follow-up exam     for drainage   Past Surgical History  Procedure Date  . Esophagogastroduodenoscopy 04/2009    stricture/GERD   History  Substance Use Topics  . Smoking status: Never Smoker   . Smokeless tobacco: Not on file  . Alcohol Use: Yes     6 pk every 3 days; 12 pk/week   Family History  Problem Relation Age of Onset  . Diabetes Mother   . Cancer Father 36    colon cancer  . Diabetes Maternal Aunt   . Cancer Cousin     breast cancer  . Diabetes Other    Allergies  Allergen Reactions  .  Codeine     REACTION: anxious   Current Outpatient Prescriptions on File Prior to Visit  Medication Sig Dispense Refill  . atorvastatin (LIPITOR) 40 MG tablet Take one by mouth daily  30 tablet  11  . cyclobenzaprine (FLEXERIL) 10 MG tablet Take 1 tablet by mouth up to 3 times a day as needed for muscle spasm/body aches  30 tablet  0  . fluticasone (FLONASE) 50 MCG/ACT nasal spray 2 sprays by Nasal route daily as needed.        . insulin glargine (LANTUS) 100 UNIT/ML injection Inject 60 Units into the skin every morning.       . Liraglutide (VICTOZA [place]) Inject 1.8 mg into the skin daily. Victoza 0.6mg /47ml soln       . metFORMIN (GLUCOPHAGE) 1000 MG tablet Take 1,000 mg by mouth 2 (two) times daily with a meal.        . olmesartan (BENICAR) 40 MG tablet Take 40 mg by mouth daily.        [name] omeprazole (PRILOSEC) 40 MG capsule Take 1 capsule (40 mg  total) by mouth daily. Every morning 30 minutes before breakfast meal.  30 capsule  11  . pioglitazone (ACTOS) 45 MG tablet Take 45 mg by mouth daily.        . tadalafil (CIALIS) 20 MG tablet Take 1 tablet (20 mg total) by mouth as directed.  10 tablet  11  . vitamin B-12 (CYANOCOBALAMIN) 1000 MCG tablet Take 1,000 mcg by mouth daily.            Review of Systems Review of Systems  Constitutional: Negative for fever, appetite change, fatigue and unexpected weight change.  Eyes: Negative for pain and visual disturbance.  Respiratory: Negative for cough and shortness of breath.   Cardiovascular: Negative for cp or palpitations    Gastrointestinal: Negative for nausea, diarrhea and constipation.  Genitourinary: Negative for urgency and frequency.  Skin: Negative for pallor or rash   MSk pos for shoulder pain/ neg for redness / swelling of any joints, neg for back pain Neurological: Negative for weakness, light-headedness, numbness and headaches.  Hematological: Negative for adenopathy. Does not bruise/bleed easily.  Psychiatric/Behavioral: Negative  for dysphoric mood. The patient is not nervous/anxious.          Objective:   Physical Exam  Constitutional: He appears well-developed and well-nourished. No distress.       overwt and well appearing   HENT:  Head: Normocephalic and atraumatic.  Mouth/Throat: Oropharynx is clear and moist.  Eyes: Conjunctivae and EOM are normal. Pupils are equal, round, and reactive to light. No scleral icterus.  Neck: Normal range of motion and full passive range of motion without pain. Neck supple. No JVD present. No spinous process tenderness present. No rigidity. No edema present. No thyromegaly present.  Musculoskeletal: He exhibits tenderness. He exhibits no edema.       Right shoulder: He exhibits decreased range of motion and tenderness. He exhibits no swelling, no effusion, no crepitus, no deformity, normal pulse and normal strength.       R shoulder - pos for acromion tenderness , pos Hawking and Neer tests , pain on full abduction and apprehension to extend  No bicep tenderness Prominent R scapula (per pt since birth)-nontender No trapezius tenderness   Lymphadenopathy:    He has no cervical adenopathy.  Neurological: He is alert. He has normal strength and normal reflexes. No cranial nerve deficit or sensory deficit. He exhibits normal muscle tone. Coordination normal.  Skin: Skin is warm and dry. No rash noted. No erythema.  Psychiatric: He has a normal mood and affect.          Assessment & Plan:

## 2011-10-12 NOTE — Assessment & Plan Note (Addendum)
In baseball/softball pitcher - recurrent overhead use  Suspect muscular or rot cuff tendonitis  R scapula is prominent - pt told me that was trait since childhood Naproxen- if bp ok  vicodin prn severe pain  Heat/ passive rom exercises to avoid frozen shoulder  Xray today Then update  ? May need injection

## 2011-10-15 ENCOUNTER — Ambulatory Visit (INDEPENDENT_AMBULATORY_CARE_PROVIDER_SITE_OTHER)
Admission: RE | Admit: 2011-10-15 | Discharge: 2011-10-15 | Disposition: A | Discharge status: Home / Self Care | Payer: 59 | Source: Ambulatory Visit | Attending: Family Medicine | Admitting: Family Medicine

## 2011-10-15 ENCOUNTER — Telehealth: Payer: Self-pay | Admitting: Family Medicine

## 2011-10-15 DIAGNOSIS — R9389 Abnormal findings on diagnostic imaging of other specified body structures: Secondary | ICD-10-CM

## 2011-10-15 DIAGNOSIS — R918 Other nonspecific abnormal finding of lung field: Secondary | ICD-10-CM

## 2011-10-15 NOTE — Telephone Encounter (Signed)
Message copied by Judy Pimple on Mon Oct 15, 2011 10:40 AM ------      Message from: Mitchell Palmer      Created: Mon Oct 15, 2011 10:35 AM       Patient notified as instructed by telephone. Pt will come today at 2pm for chest xray. Pt will schedule appt with Dr Patsy Lager when he comes in today so he can get an appt card as a reminder.

## 2011-10-22 ENCOUNTER — Ambulatory Visit (INDEPENDENT_AMBULATORY_CARE_PROVIDER_SITE_OTHER): Payer: 59 | Admitting: Family Medicine

## 2011-10-22 ENCOUNTER — Encounter: Payer: Self-pay | Admitting: Family Medicine

## 2011-10-22 VITALS — BP 130/88 | HR 93 | Temp 98.8°F | Ht 70.0 in | Wt 257.4 lb

## 2011-10-22 DIAGNOSIS — R279 Unspecified lack of coordination: Secondary | ICD-10-CM

## 2011-10-22 DIAGNOSIS — M259 Joint disorder, unspecified: Secondary | ICD-10-CM

## 2011-10-22 DIAGNOSIS — G2589 Other specified extrapyramidal and movement disorders: Secondary | ICD-10-CM

## 2011-10-22 DIAGNOSIS — M67919 Unspecified disorder of synovium and tendon, unspecified shoulder: Secondary | ICD-10-CM

## 2011-10-22 DIAGNOSIS — M19019 Primary osteoarthritis, unspecified shoulder: Secondary | ICD-10-CM

## 2011-10-22 NOTE — Progress Notes (Signed)
Patient Name: Mitchell Palmer Date of Birth: 04/08/1966 Medical Record Number: [id number] Gender: male Date of Encounter: 10/22/2011  History of Present Illness:  Mitchell Palmer is a 46 y.o. very pleasant male patient who presents with the following:  R shoulder pain, new to me:  Now not able to throw --- could not throw since 2012. Mostly pain in the mid to terminal portion of throwing. When releasing will hurt.   Will hurt with IROM, and with moving fast.  Some pain with abduction.   The patient noted above presents with shoulder pain that has been ongoing for 5 months.  there is no history of trauma or accident. The patient denies neck pain or radicular symptoms. Denies dislocation, subluxation, separation of the shoulder. The patient does complain of pain in the overhead plane.  Medications Tried: otc tylenol and nsaids Ice or Heat: no help Tried PT: No  Prior shoulder Injury: No Prior surgery: No Prior fracture: No  Handedness: RHD   Patient Active Problem List  Diagnoses  . NEOPLASM UNCERTAIN BEHAVIOR OTHER SPEC SITES  . DIABETES MELLITUS, TYPE II  . HYPERLIPIDEMIA  . HYPERTENSION  . ALLERGIC RHINITIS  . ESOPHAGEAL STRICTURE  . GERD  . ABSCESS, NECK  . PLANTAR FASCIITIS, TRAUMATIC  . FOOT PAIN, LEFT  . OTHER DYSPHAGIA  . Right shoulder pain  . Abnormal chest x-ray   Past Medical History  Diagnosis Date  . Allergy     allegic rhinitis  . Diabetes mellitus     type II  . Hyperlipidemia   . Hypertension   . GERD (gastroesophageal reflux disease)   . Hyperplastic polyp of intestine 2010  . Esophageal stricture   . History of hernia repair     as a baby  . S/P ear surgery, follow-up exam     for drainage   Past Surgical History  Procedure Date  . Esophagogastroduodenoscopy 04/2009    stricture/GERD   History  Substance Use Topics  . Smoking status: Never Smoker   . Smokeless tobacco: Not on file  . Alcohol Use: Yes     6 pk every 3 days; 12  pk/week   Family History  Problem Relation Age of Onset  . Diabetes Mother   . Cancer Father 79    colon cancer  . Diabetes Maternal Aunt   . Cancer Cousin     breast cancer  . Diabetes Other    Allergies  Allergen Reactions  . Codeine     REACTION: anxious    Medication list has been reviewed and updated.  Review of Systems:  GEN: No fevers, chills. Nontoxic. Primarily MSK c/o today. MSK: Detailed in the HPI GI: tolerating PO intake without difficulty Neuro: No numbness, parasthesias, or tingling associated. Otherwise the pertinent positives of the ROS are noted above.    Physical Examination: Filed Vitals:   10/22/11 0828  BP: 130/88  Pulse: 93  Temp: 98.8 F (37.1 C)  TempSrc: Oral  Height: 5\' 10"  (1.778 m)  Weight: 257 lb 6.4 oz (116.756 kg)  SpO2: 98%    Body mass index is 36.93 kg/(m^2).   GEN: Well-developed,well-nourished,in no acute distress; alert,appropriate and cooperative throughout examination HEENT: Normocephalic and atraumatic without obvious abnormalities. Ears, externally no deformities PULM: Breathing comfortably in no respiratory distress EXT: No clubbing, cyanosis, or edema PSYCH: Normally interactive. Cooperative during the interview. Pleasant. Friendly and conversant. Not anxious or depressed appearing. Normal, full affect.  Shoulder: R Inspection: Notable right-sided weaning and scapular dyskinesis compared to  the left side. There is also prominent scapular spine Ecchymosis/edema: neg  AC joint, scapula, clavicle: NT Cervical spine: NT, full ROM Spurling's: neg Abduction: full, 5/5 Flexion: full, 5/5 IR, full, lift-off: 5/5 ER at neutral: full, 5/5 AC crossover: neg Neer: pos Hawkins: pos Drop Test: neg Empty Can: pos Supraspinatus insertion: mild-mod T Bicipital groove: NT Speed's: neg Yergason's: neg Sulcus sign: neg Scapular dyskinesis: considerable C5-T1 intact  Neuro: Sensation intact Grip 5/5   Assessment and  Plan: 1. Tendinopathy of rotator cuff  Ambulatory referral to Physical Therapy  2. Scapular dyskinesis  Ambulatory referral to Physical Therapy  3. AC joint arthropathy  Ambulatory referral to Physical Therapy    X-rays, right shoulder. Independently reviewed. There is some mild a.c. joint arthropathy. Minimal to no glenohumeral arthropathy.  Suspect this is secondary impingement causing rotator cuff tendinopathy as well as some a.c. joint inflammation.  Loraine Leriche is scapular dyskinesis which will certainly influence his ability to throw baseball and softball. Formal rehabilitation, scapular stabilization.  SubAC Injection, R Verbal consent was obtained from the patient. Risks (including infection), benefits, and alternatives were explained. Patient prepped with Chloraprep and Ethyl Chloride used for anesthesia. The subacromial space was injected using the posterior approach. The patient tolerated the procedure well and had decreased pain post injection. No complications. Injection: 9 cc of Lidocaine 1% and 1cc of Depo-Medrol 40 mg. Needle: 22 gauge

## 2011-10-22 NOTE — Patient Instructions (Signed)
Recheck 6-7 weeks

## 2011-12-05 ENCOUNTER — Ambulatory Visit (INDEPENDENT_AMBULATORY_CARE_PROVIDER_SITE_OTHER): Payer: 59 | Admitting: Family Medicine

## 2011-12-05 ENCOUNTER — Encounter: Payer: Self-pay | Admitting: Family Medicine

## 2011-12-05 VITALS — BP 124/80 | HR 75 | Temp 98.5°F | Ht 70.0 in | Wt 256.0 lb

## 2011-12-05 DIAGNOSIS — M67919 Unspecified disorder of synovium and tendon, unspecified shoulder: Secondary | ICD-10-CM

## 2011-12-05 DIAGNOSIS — M719 Bursopathy, unspecified: Secondary | ICD-10-CM

## 2011-12-05 MED ORDER — DICLOFENAC SODIUM 75 MG PO TBEC: 75.0000 mg | DELAYED_RELEASE_TABLET | Freq: Two times a day (BID) | ORAL | Status: DC

## 2011-12-05 NOTE — Progress Notes (Signed)
  Patient Name: Mitchell Palmer Date of Birth: [birth year] Age: 46 y.o. Medical Record Number: [id number] Gender: male Date of Encounter: 12/05/2011  History of Present Illness:  Mitchell Palmer is a 46 y.o. very pleasant male patient who presents with the following:  F/u R rtc tendinopathy, s/p subac inj last ov, doing hep, formal PT, now 80% improved  About 80% better - abd will catch an dhen laying on right side.    Past Medical History, Surgical History, Social History, Family History, Problem List, Medications, and Allergies have been reviewed and updated if relevant.  Review of Systems:  GEN: No fevers, chills. Nontoxic. Primarily MSK c/o today. MSK: Detailed in the HPI GI: tolerating PO intake without difficulty Neuro: No numbness, parasthesias, or tingling associated. Otherwise the pertinent positives of the ROS are noted above.    Physical Examination: Filed Vitals:   12/05/11 0935  BP: 124/80  Pulse: 75  Temp: 98.5 F (36.9 C)  TempSrc: Oral  Height: 5\' 10"  (1.778 m)  Weight: 256 lb (116.121 kg)  SpO2: 97%    Body mass index is 36.73 kg/(m^2).   GEN: WDWN, NAD, Non-toxic, Alert & Oriented x 3 HEENT: Atraumatic, Normocephalic.  Ears and Nose: No external deformity. EXTR: No clubbing/cyanosis/edema NEURO: Normal gait.  PSYCH: Normally interactive. Conversant. Not depressed or anxious appearing.  Calm demeanor.   Shoulder: R Inspection: No muscle wasting or winging Ecchymosis/edema: neg  AC joint, scapula, clavicle: NT Cervical spine: NT, full ROM Spurling's: neg Abduction: full, 5/5 Flexion: full, 5/5 IR, full, lift-off: 5/5, mild pain ER at neutral: full, 5/5 AC crossover and compression: neg Neer: neg Hawkins: mild pain Drop Test: neg Empty Can: neg Supraspinatus insertion: NT Bicipital groove: NT Speed's: neg Yergason's: neg Sulcus sign: neg Scapular dyskinesis: none C5-T1 intact Sensation intact Grip 5/5   Assessment and Plan: rtc  tendinopathy, mostly improved, cont HEP, f/u if not improving in 6-8 weeks. D/c to hep

## 2012-01-08 ENCOUNTER — Other Ambulatory Visit: Payer: Self-pay | Admitting: *Deleted

## 2012-01-08 ENCOUNTER — Ambulatory Visit (INDEPENDENT_AMBULATORY_CARE_PROVIDER_SITE_OTHER)
Admission: RE | Admit: 2012-01-08 | Discharge: 2012-01-08 | Disposition: A | Discharge status: Home / Self Care | Payer: 59 | Source: Ambulatory Visit | Attending: Family Medicine | Admitting: Family Medicine

## 2012-01-08 ENCOUNTER — Ambulatory Visit (INDEPENDENT_AMBULATORY_CARE_PROVIDER_SITE_OTHER): Payer: 59 | Admitting: Family Medicine

## 2012-01-08 ENCOUNTER — Encounter: Payer: Self-pay | Admitting: Family Medicine

## 2012-01-08 VITALS — BP 142/80 | HR 100 | Temp 98.3°F | Ht 70.0 in | Wt 257.8 lb

## 2012-01-08 DIAGNOSIS — M25571 Pain in right ankle and joints of right foot: Secondary | ICD-10-CM

## 2012-01-08 DIAGNOSIS — M25579 Pain in unspecified ankle and joints of unspecified foot: Secondary | ICD-10-CM

## 2012-01-08 MED ORDER — NAPROXEN 500 MG PO TABS: 500.0000 mg | ORAL_TABLET | Freq: Two times a day (BID) | ORAL | Status: DC | PRN

## 2012-01-08 NOTE — Telephone Encounter (Signed)
Will refill electronically  

## 2012-01-08 NOTE — Telephone Encounter (Signed)
Faxed refill request from [facility], last filled 10/27/11.

## 2012-01-08 NOTE — Patient Instructions (Signed)
I sent the naproxen to your pharmacy  Elevate and ice your ankle as much as you can (ice 10 minutes at a time )  Use the air cast when you are up and around - take it off to rest Will call when I get radiology reading of the x ray

## 2012-01-08 NOTE — Progress Notes (Signed)
Subjective:    Patient ID: Mitchell Palmer, male    DOB: 1965/10/15, 46 y.o.   MRN: [id number]  HPI Here for ankle injury  Yesterday  - jumped out of bed , stepped on a shoe and R ankle (? Inverted)  Felt a pop / pretty painful- had to get off of it  Then was able to bear weight but with pain  Started to swell by evening  Could not see bruising  Redness this am - warm Tender anteriorly on ankle  Feels a little tight and numb    Has injured this ankle before -no fractures  Has torn ligaments- but never had to have surgery   Was supposed to play ball tonight and cancelled it   Patient Active Problem List  Diagnoses  . NEOPLASM UNCERTAIN BEHAVIOR OTHER SPEC SITES  . DIABETES MELLITUS, TYPE II  . HYPERLIPIDEMIA  . HYPERTENSION  . ALLERGIC RHINITIS  . ESOPHAGEAL STRICTURE  . GERD  . ABSCESS, NECK  . PLANTAR FASCIITIS, TRAUMATIC  . FOOT PAIN, LEFT  . OTHER DYSPHAGIA  . Right shoulder pain  . Abnormal chest x-ray  . Right ankle pain   Past Medical History  Diagnosis Date  . Allergy     allegic rhinitis  . Diabetes mellitus     type II  . Hyperlipidemia   . Hypertension   . GERD (gastroesophageal reflux disease)   . Hyperplastic polyp of intestine 2010  . Esophageal stricture   . History of hernia repair     as a baby  . S/P ear surgery, follow-up exam     for drainage   Past Surgical History  Procedure Date  . Esophagogastroduodenoscopy 04/2009    stricture/GERD   History  Substance Use Topics  . Smoking status: Never Smoker   . Smokeless tobacco: Not on file  . Alcohol Use: Yes     6 pk every 3 days; 12 pk/week   Family History  Problem Relation Age of Onset  . Diabetes Mother   . Cancer Father 89    colon cancer  . Diabetes Maternal Aunt   . Cancer Cousin     breast cancer  . Diabetes Other    Allergies  Allergen Reactions  . Codeine     REACTION: anxious   Current Outpatient Prescriptions on File Prior to Visit  Medication Sig Dispense  Refill  . atorvastatin (LIPITOR) 40 MG tablet Take one by mouth daily  30 tablet  11  . B-D INS SYR ULTRAFINE 1CC/31G 31G X 5/16" 1 ML MISC       . B-D UF III MINI PEN NEEDLES 31G X 5 MM MISC       . cyclobenzaprine (FLEXERIL) 10 MG tablet Take 1 tablet by mouth up to 3 times a day as needed for muscle spasm/body aches  30 tablet  0  . diclofenac (VOLTAREN) 75 MG EC tablet Take 1 tablet (75 mg total) by mouth 2 (two) times daily.  60 tablet  3  . fluticasone (FLONASE) 50 MCG/ACT nasal spray 2 sprays by Nasal route daily as needed.        [name] HYDROcodone-acetaminophen (VICODIN) 5-500 MG per tablet Take 1 tablet by mouth every 6 (six) hours as needed for pain (careful of sedation).  20 tablet  0  . insulin glargine (LANTUS) 100 UNIT/ML injection Inject 60 Units into the skin every morning.       . Liraglutide (VICTOZA [place]) Inject 1.8 mg into the skin daily. Victoza  0.6mg /58ml soln       . metFORMIN (GLUCOPHAGE) 1000 MG tablet Take 1,000 mg by mouth 2 (two) times daily with a meal.        . olmesartan (BENICAR) 40 MG tablet Take 40 mg by mouth daily.        [name] omeprazole (PRILOSEC) 40 MG capsule Take 1 capsule (40 mg total) by mouth daily. Every morning 30 minutes before breakfast meal.  30 capsule  11  . ONE TOUCH ULTRA TEST test strip       . pioglitazone (ACTOS) 45 MG tablet Take 45 mg by mouth daily.        . tadalafil (CIALIS) 20 MG tablet Take 1 tablet (20 mg total) by mouth as directed.  10 tablet  11  . vitamin B-12 (CYANOCOBALAMIN) 1000 MCG tablet Take 1,000 mcg by mouth daily.            Review of Systems Review of Systems  Constitutional: Negative for fever, appetite change, fatigue and unexpected weight change.  Eyes: Negative for pain and visual disturbance.  Respiratory: Negative for cough and shortness of breath.   Cardiovascular: Negative for cp or palpitations    Gastrointestinal: Negative for nausea, diarrhea and constipation.  Genitourinary: Negative for urgency and frequency.    Skin: Negative for pallor or rash   MSK pos for ankle pain/ swelling/ redness/ neg for other joint changes  Neurological: Negative for weakness, light-headedness, numbness and headaches.  Hematological: Negative for adenopathy. Does not bruise/bleed easily.  Psychiatric/Behavioral: Negative for dysphoric mood. The patient is not nervous/anxious.         Objective:   Physical Exam  Constitutional: He appears well-developed and well-nourished. No distress.       overwt and well appearing   HENT:  Head: Normocephalic and atraumatic.  Neck: Normal range of motion. Neck supple.  Cardiovascular: Normal rate and regular rhythm.   Pulmonary/Chest: Effort normal and breath sounds normal.  Musculoskeletal: He exhibits edema and tenderness.       R ankle is mildly swollen  Tender both ant and post to medial malleolus and over anterior ankle More pain to plantar flex than dorsiflex  Pain on inversion also  Neg talar tilt  No bony tenderness in foot Some erythema (wihtout bruising) over anterior ankle going 1/4 way up shin There is a small injury over mid shin where he hit that on a dresser during the fall - slt tender  Neurological: He is alert.  Skin: Skin is warm. There is erythema.  Psychiatric: He has a normal mood and affect.          Assessment & Plan:

## 2012-01-18 ENCOUNTER — Other Ambulatory Visit: Payer: Self-pay | Admitting: Family Medicine

## 2012-01-18 NOTE — Telephone Encounter (Signed)
Will refill electronically  

## 2012-02-06 ENCOUNTER — Other Ambulatory Visit: Payer: Self-pay | Admitting: *Deleted

## 2012-02-06 MED ORDER — OMEPRAZOLE 40 MG PO CPDR: 40.0000 mg | DELAYED_RELEASE_CAPSULE | Freq: Every day | ORAL | Status: DC

## 2012-02-06 NOTE — Telephone Encounter (Signed)
Received faxed refill request from pharmacy. Refill sent to pharmacy electronically. 

## 2012-05-08 ENCOUNTER — Encounter: Payer: Self-pay | Admitting: Gastroenterology

## 2012-06-04 ENCOUNTER — Other Ambulatory Visit: Payer: Self-pay | Admitting: *Deleted

## 2012-06-04 MED ORDER — OMEPRAZOLE 40 MG PO CPDR: 40.0000 mg | DELAYED_RELEASE_CAPSULE | Freq: Every day | ORAL | Status: DC

## 2012-06-16 ENCOUNTER — Other Ambulatory Visit: Payer: Self-pay | Admitting: Family Medicine

## 2012-06-16 NOTE — Telephone Encounter (Signed)
Give him one refil, thanks

## 2012-06-16 NOTE — Telephone Encounter (Signed)
Ok to refill 

## 2012-06-17 ENCOUNTER — Ambulatory Visit (INDEPENDENT_AMBULATORY_CARE_PROVIDER_SITE_OTHER): Payer: 59 | Admitting: Family Medicine

## 2012-06-17 ENCOUNTER — Ambulatory Visit (INDEPENDENT_AMBULATORY_CARE_PROVIDER_SITE_OTHER)
Admission: RE | Admit: 2012-06-17 | Discharge: 2012-06-17 | Disposition: A | Discharge status: Home / Self Care | Payer: 59 | Source: Ambulatory Visit | Attending: Family Medicine | Admitting: Family Medicine

## 2012-06-17 ENCOUNTER — Encounter: Payer: Self-pay | Admitting: Family Medicine

## 2012-06-17 VITALS — BP 144/90 | HR 82 | Temp 98.3°F | Ht 70.0 in | Wt 253.2 lb

## 2012-06-17 DIAGNOSIS — M545 Low back pain, unspecified: Secondary | ICD-10-CM

## 2012-06-17 DIAGNOSIS — Z23 Encounter for immunization: Secondary | ICD-10-CM

## 2012-06-17 MED ORDER — HYDROCODONE-ACETAMINOPHEN 5-500 MG PO TABS: 1.0000 | ORAL_TABLET | Freq: Four times a day (QID) | ORAL | Status: DC | PRN

## 2012-06-17 MED ORDER — MELOXICAM 15 MG PO TABS: 15.0000 mg | ORAL_TABLET | Freq: Every day | ORAL | Status: DC

## 2012-06-17 NOTE — Progress Notes (Signed)
Subjective:    Patient ID: Mitchell Palmer, male    DOB: 02/15/1966, 46 y.o.   MRN: [id number]  HPI Is here with lower back pain  Has had muscle strain in the past - usually gets better fast   This time just not getting better 3 weeks - and pain is very low on spine , in the center (cannot feel knots or spasms)  Standing is the worst position and lying or sitting are better  No radiation to the legs  Worse to bend forward than back  No numbness or weakness   No xrays in the past   Unsure how it started  Did some heavy lifting while leaning over working on something 3 weeks ago- ? Brought it on   Is not a regular exerciser   Flexeril helps just a little  Has taken some naprosyn or aleve-not too helpful (aleve is just as effective)   Patient Active Problem List  Diagnosis  . NEOPLASM UNCERTAIN BEHAVIOR OTHER SPEC SITES  . DIABETES MELLITUS, TYPE II  . HYPERLIPIDEMIA  . HYPERTENSION  . ALLERGIC RHINITIS  . ESOPHAGEAL STRICTURE  . GERD  . ABSCESS, NECK  . PLANTAR FASCIITIS, TRAUMATIC  . FOOT PAIN, LEFT  . OTHER DYSPHAGIA  . Right shoulder pain  . Abnormal chest x-ray  . Right ankle pain   Past Medical History  Diagnosis Date  . Allergy     allegic rhinitis  . Diabetes mellitus     type II  . Hyperlipidemia   . Hypertension   . GERD (gastroesophageal reflux disease)   . Hyperplastic polyp of intestine 2010  . Esophageal stricture   . History of hernia repair     as a baby  . S/P ear surgery, follow-up exam     for drainage   Past Surgical History  Procedure Date  . Esophagogastroduodenoscopy 04/2009    stricture/GERD   History  Substance Use Topics  . Smoking status: Never Smoker   . Smokeless tobacco: Not on file  . Alcohol Use: Yes     6 pk every 3 days; 12 pk/week   Family History  Problem Relation Age of Onset  . Diabetes Mother   . Cancer Father 32    colon cancer  . Diabetes Maternal Aunt   . Cancer Cousin     breast cancer  . Diabetes  Other    Allergies  Allergen Reactions  . Codeine     REACTION: anxious   Current Outpatient Prescriptions on File Prior to Visit  Medication Sig Dispense Refill  . atorvastatin (LIPITOR) 40 MG tablet Take one by mouth daily  30 tablet  11  . B-D INS SYR ULTRAFINE 1CC/31G 31G X 5/16" 1 ML MISC       . B-D UF III MINI PEN NEEDLES 31G X 5 MM MISC       . cyclobenzaprine (FLEXERIL) 10 MG tablet TAKE 1 TABLET BY MOUTH UP TO THREE TIMES A DAY AS NEEDED FOR MUSCLE SPASM/BODY ACHES  30 tablet  0  . diclofenac (VOLTAREN) 75 MG EC tablet Take 1 tablet (75 mg total) by mouth 2 (two) times daily.  60 tablet  3  . fluticasone (FLONASE) 50 MCG/ACT nasal spray 2 sprays by Nasal route daily as needed.        [name] HYDROcodone-acetaminophen (VICODIN) 5-500 MG per tablet Take 1 tablet by mouth every 6 (six) hours as needed for pain (careful of sedation).  20 tablet  0  . insulin  glargine (LANTUS) 100 UNIT/ML injection Inject 60 Units into the skin every morning.       . Liraglutide (VICTOZA [place]) Inject 1.8 mg into the skin daily. Victoza 0.6mg /18ml soln       . metFORMIN (GLUCOPHAGE) 1000 MG tablet Take 1,000 mg by mouth 2 (two) times daily with a meal.        . naproxen (NAPROSYN) 500 MG tablet Take 1 tablet (500 mg total) by mouth 2 (two) times daily as needed. With food  60 tablet  1  . olmesartan (BENICAR) 40 MG tablet Take 40 mg by mouth daily.        [name] omeprazole (PRILOSEC) 40 MG capsule Take 1 capsule (40 mg total) by mouth daily. Every morning 30 minutes before breakfast meal.  30 capsule  3  . ONE TOUCH ULTRA TEST test strip       . pioglitazone (ACTOS) 45 MG tablet Take 45 mg by mouth daily.        . tadalafil (CIALIS) 20 MG tablet Take 1 tablet (20 mg total) by mouth as directed.  10 tablet  11  . vitamin B-12 (CYANOCOBALAMIN) 1000 MCG tablet Take 1,000 mcg by mouth daily.              Review of Systems Review of Systems  Constitutional: Negative for fever, appetite change, fatigue and  unexpected weight change.  Eyes: Negative for pain and visual disturbance.  Respiratory: Negative for cough and shortness of breath.   Cardiovascular: Negative for cp or palpitations    Gastrointestinal: Negative for nausea, diarrhea and constipation.  Genitourinary: Negative for urgency and frequency. neg for blood in urine or flank pain  MSK pos for low back pain without radiation, neg for swollen joints Skin: Negative for pallor or rash   Neurological: Negative for weakness, light-headedness, numbness and headaches.  Hematological: Negative for adenopathy. Does not bruise/bleed easily.  Psychiatric/Behavioral: Negative for dysphoric mood. The patient is not nervous/anxious.         Objective:   Physical Exam  Constitutional: He appears well-developed and well-nourished. No distress.       Obese and well appearing   Eyes: Conjunctivae normal and EOM are normal. Pupils are equal, round, and reactive to light. Right eye exhibits no discharge. Left eye exhibits no discharge.  Neck: Normal range of motion. Neck supple. No thyromegaly present.  Cardiovascular: Normal rate and regular rhythm.   Pulmonary/Chest: Breath sounds normal.  Musculoskeletal: He exhibits no edema.       Lumbar back: He exhibits decreased range of motion, tenderness and bony tenderness. He exhibits no edema and no deformity.       Tender over L3-L5 area and also surrounding musculature Full flex Pain to extend and laterally flex left Nl twist Neg SLR Nl rom hips    Lymphadenopathy:    He has no cervical adenopathy.  Neurological: He is alert. He has normal reflexes. No cranial nerve deficit or sensory deficit. He exhibits normal muscle tone.  Skin: Skin is warm and dry. No rash noted. No erythema.  Psychiatric: He has a normal mood and affect.          Assessment & Plan:

## 2012-06-17 NOTE — Patient Instructions (Addendum)
Try the mobic for pain (this is an anti inflammatory)  Use flexeril when needed Continue heat on back  Here are some handouts on back pain  Xray today and we will get a reading tomorrow

## 2012-06-17 NOTE — Assessment & Plan Note (Signed)
No neurologic red flags- but this is recurrent and also has had this bout for 3 weeks Suspect poor physical conditioning and obesity are not helping Xray today in light of length of problem Flexeril/ mobic prn  Refilled vicodin for severe pain only (not to mix with flexeril)  Given 2 handouts on back pain Will continue heat Go from there based on xray result, consider PT

## 2012-06-23 ENCOUNTER — Encounter: Payer: Self-pay | Admitting: Family Medicine

## 2012-06-23 ENCOUNTER — Ambulatory Visit (INDEPENDENT_AMBULATORY_CARE_PROVIDER_SITE_OTHER): Payer: 59 | Admitting: Family Medicine

## 2012-06-23 VITALS — BP 134/82 | HR 78 | Temp 98.9°F | Wt 253.5 lb

## 2012-06-23 DIAGNOSIS — M4310 Spondylolisthesis, site unspecified: Secondary | ICD-10-CM

## 2012-06-23 DIAGNOSIS — M549 Dorsalgia, unspecified: Secondary | ICD-10-CM

## 2012-06-23 DIAGNOSIS — Q762 Congenital spondylolisthesis: Secondary | ICD-10-CM

## 2012-06-23 NOTE — Progress Notes (Signed)
Nature conservation officer at [facility] [address] [place] [address] Phone: [phone] Fax: [phone]  Date:  06/23/2012   Name:  Mitchell Palmer   DOB:  [birth year]   MRN:  [id number] Gender: male Age: 46 y.o.  PCP:  Roxy Manns, MD  Evaluating MD: Hannah Beat, MD   Chief Complaint: Back Pain   History of Present Illness:  Mitchell Palmer is a 46 y.o. pleasant patient who presents with the following:  Back has been bothering him for about weeks --- felt something move and tighten up, took some muscle relaxers and it went away. Came in on Tuesday. He has been taking some flexeril and prn vidocin without much narcotic needs.  He also has been taking some mobic.  Not really active at this point - very rarely will have to move something at work  No numbness, tingling, or radiculopathy  Dg Lumbar Spine Complete  06/17/2012  *RADIOLOGY REPORT*  Clinical Data: Low back pain  LUMBAR SPINE - COMPLETE 4+ VIEW  Comparison: None.  Findings: There is very slight anterolisthesis of L5 on S1 by 5 mm. There does appear to be a right sided pars defect present with some sclerosis of the left pars.  If further assessment is warranted CT could be performed.  Intervertebral disc spaces are relatively normal with only slightly decreased disc space at the L2-L3 level. The SI joints appear normal.  IMPRESSION:  Right-sided pars defect with 5 mm anterolisthesis of L5 on S1. Somewhat sclerotic appearance of the left pars.   Original Report Authenticated By: Juline Patch, M.D.     -- Independently reviewed in office with patient. Agree with probable R sided pars defect at L5 with minimal anterolisthesis, mild DDD more prominent around L2-3  Patient Active Problem List  Diagnosis  . NEOPLASM UNCERTAIN BEHAVIOR OTHER SPEC SITES  . DIABETES MELLITUS, TYPE II  . HYPERLIPIDEMIA  . HYPERTENSION  . ALLERGIC RHINITIS  . ESOPHAGEAL STRICTURE  . GERD  . ABSCESS, NECK  . PLANTAR FASCIITIS,  TRAUMATIC  . FOOT PAIN, LEFT  . OTHER DYSPHAGIA  . Right shoulder pain  . Abnormal chest x-ray  . Right ankle pain  . Low back pain    Past Medical History  Diagnosis Date  . Allergy     allegic rhinitis  . Diabetes mellitus     type II  . Hyperlipidemia   . Hypertension   . GERD (gastroesophageal reflux disease)   . Hyperplastic polyp of intestine 2010  . Esophageal stricture   . History of hernia repair     as a baby  . S/P ear surgery, follow-up exam     for drainage    Past Surgical History  Procedure Date  . Esophagogastroduodenoscopy 04/2009    stricture/GERD    History  Substance Use Topics  . Smoking status: Never Smoker   . Smokeless tobacco: Not on file  . Alcohol Use: Yes     Comment: 6 pk every 3 days; 12 pk/week    Family History  Problem Relation Age of Onset  . Diabetes Mother   . Cancer Father 45    colon cancer  . Diabetes Maternal Aunt   . Cancer Cousin     breast cancer  . Diabetes Other     Allergies  Allergen Reactions  . Codeine     REACTION: anxious    Medication list has been reviewed and updated.  Outpatient Prescriptions Prior to Visit  Medication Sig Dispense  Refill  . atorvastatin (LIPITOR) 40 MG tablet Take one by mouth daily  30 tablet  11  . B-D INS SYR ULTRAFINE 1CC/31G 31G X 5/16" 1 ML MISC       . B-D UF III MINI PEN NEEDLES 31G X 5 MM MISC       . cyclobenzaprine (FLEXERIL) 10 MG tablet TAKE 1 TABLET BY MOUTH UP TO THREE TIMES A DAY AS NEEDED FOR MUSCLE SPASM/BODY ACHES  30 tablet  0  . fluticasone (FLONASE) 50 MCG/ACT nasal spray 2 sprays by Nasal route daily as needed.        [name] HYDROcodone-acetaminophen (VICODIN) 5-500 MG per tablet Take 1 tablet by mouth every 6 (six) hours as needed for pain (careful of sedation).  20 tablet  0  . insulin glargine (LANTUS) 100 UNIT/ML injection Inject 60 Units into the skin every morning.       . Liraglutide (VICTOZA [place]) Inject 1.8 mg into the skin daily. Victoza 0.6mg /66ml soln        . meloxicam (MOBIC) 15 MG tablet Take 1 tablet (15 mg total) by mouth daily. With food as needed for back pain  30 tablet  0  . metFORMIN (GLUCOPHAGE) 1000 MG tablet Take 1,000 mg by mouth 2 (two) times daily with a meal.        . olmesartan (BENICAR) 40 MG tablet Take 40 mg by mouth daily.        [name] omeprazole (PRILOSEC) 40 MG capsule Take 1 capsule (40 mg total) by mouth daily. Every morning 30 minutes before breakfast meal.  30 capsule  3  . ONE TOUCH ULTRA TEST test strip       . pioglitazone (ACTOS) 45 MG tablet Take 45 mg by mouth daily.        . tadalafil (CIALIS) 20 MG tablet Take 1 tablet (20 mg total) by mouth as directed.  10 tablet  11  . vitamin B-12 (CYANOCOBALAMIN) 1000 MCG tablet Take 1,000 mcg by mouth daily.         Last reviewed on 06/23/2012  4:06 PM by Loma Messing, RN  Review of Systems:   GEN: No fevers, chills. Nontoxic. Primarily MSK c/o today. MSK: Detailed in the HPI GI: tolerating PO intake without difficulty Neuro: No numbness, parasthesias, or tingling associated. Otherwise the pertinent positives of the ROS are noted above.    Physical Examination: Filed Vitals:   06/23/12 1603  BP: 134/82  Pulse: 78  Temp: 98.9 F (37.2 C)  TempSrc: Oral  Weight: 253 lb 8 oz (114.987 kg)    There is no height on file to calculate BMI. Ideal Body Weight:     GEN: Well-developed,well-nourished,in no acute distress; alert,appropriate and cooperative throughout examination HEENT: Normocephalic and atraumatic without obvious abnormalities. Ears, externally no deformities PULM: Breathing comfortably in no respiratory distress EXT: No clubbing, cyanosis, or edema PSYCH: Normally interactive. Cooperative during the interview. Pleasant. Friendly and conversant. Not anxious or depressed appearing. Normal, full affect.  Range of motion at  the waist: Flexion: normal Extension: limited and painful Lateral bending: normal Rotation: all normal  No echymosis  or edema Rises to examination table with no difficulty Gait: non antalgic  Inspection/Deformity: N Paraspinus Tenderness: mild around L4-5 DIrect tenderness at L5 - s1 on process  B Ankle Dorsiflexion (L5,4): 5/5 B Great Toe Dorsiflexion (L5,4): 5/5 Heel Walk (L5): WNL Toe Walk (S1): WNL Rise/Squat (L4): WNL  SENSORY B Medial Foot (L4): WNL B Dorsum (L5): WNL B  Lateral (S1): WNL Light Touch: WNL Pinprick: WNL  REFLEXES Knee (L4): 2+ Ankle (S1): 2+  B SLR, seated: neg B SLR, supine: neg B FABER: neg B Reverse FABER: neg B Greater Troch: NT B Log Roll: neg B Stork: + B Sciatic Notch: NT   Assessment and Plan:  1. Spondylolisthesis   2. Back pain    >25 minutes spent in face to face time with patient, >50% spent in counselling or coordination of care: reviewed anatomy, injury, films, and typical healing. Clinically fits with pars defect causing pain. Not extension for at least 4-6 weeks. Basic hip stretches. Grade 1 listhesis without neurological involvement, and I don't think this is clinically significant.  Suspect will improve within 6 weeks or so, if not, he will f/u with me and I may get a CT vs. SPECT scan  Orders Today:  No orders of the defined types were placed in this encounter.    Updated Medication List: (Includes new medications, updates to list, dose adjustments) No orders of the defined types were placed in this encounter.    Medications Discontinued: There are no discontinued medications.   Hannah Beat, MD

## 2012-07-05 ENCOUNTER — Other Ambulatory Visit: Payer: Self-pay | Admitting: Family Medicine

## 2012-07-08 ENCOUNTER — Other Ambulatory Visit: Payer: Self-pay | Admitting: Family Medicine

## 2012-07-15 ENCOUNTER — Other Ambulatory Visit: Payer: Self-pay | Admitting: *Deleted

## 2012-07-15 DIAGNOSIS — M545 Low back pain, unspecified: Secondary | ICD-10-CM

## 2012-07-15 MED ORDER — MELOXICAM 15 MG PO TABS: 15.0000 mg | ORAL_TABLET | Freq: Every day | ORAL | Status: DC

## 2012-07-15 NOTE — Telephone Encounter (Signed)
Please refil that for 3 months, thanks

## 2012-08-08 ENCOUNTER — Other Ambulatory Visit: Payer: Self-pay | Admitting: Family Medicine

## 2012-08-22 ENCOUNTER — Ambulatory Visit (INDEPENDENT_AMBULATORY_CARE_PROVIDER_SITE_OTHER): Payer: 59 | Admitting: Family Medicine

## 2012-08-22 ENCOUNTER — Encounter: Payer: Self-pay | Admitting: Family Medicine

## 2012-08-22 VITALS — BP 144/86 | HR 76 | Temp 98.3°F | Ht 70.0 in | Wt 257.2 lb

## 2012-08-22 DIAGNOSIS — H6980 Other specified disorders of Eustachian tube, unspecified ear: Secondary | ICD-10-CM

## 2012-08-22 MED ORDER — OLMESARTAN MEDOXOMIL 40 MG PO TABS: 40.0000 mg | ORAL_TABLET | Freq: Every day | ORAL | Status: DC

## 2012-08-22 NOTE — Assessment & Plan Note (Signed)
After uri with sinus cong Adv to get back on flonase - bump up to bid for a week and then back to daily Stop afrin like otc spray which can cause rebound congestion  Fluids/ rest  Update if not starting to improve in a week or if worsening  -esp if worse ear pain or any fever

## 2012-08-22 NOTE — Progress Notes (Signed)
Subjective:    Patient ID: Mitchell Palmer, male    DOB: 05-22-1966, 47 y.o.   MRN: [id number]  HPI Here with ear pain - both of them hurt  Going on and off about 2.5 weeks- worse in the am and then calms down Some sinus congestion -not too bad  Hearing is a little dulled down  No fever No sinus pain  No ST  Some post nasal drip / and uses nasal spray , a bit of cough  Not a runny nose  Patient Active Problem List  Diagnosis  . NEOPLASM UNCERTAIN BEHAVIOR OTHER SPEC SITES  . DIABETES MELLITUS, TYPE II  . HYPERLIPIDEMIA  . HYPERTENSION  . ALLERGIC RHINITIS  . ESOPHAGEAL STRICTURE  . GERD  . ABSCESS, NECK  . PLANTAR FASCIITIS, TRAUMATIC  . FOOT PAIN, LEFT  . OTHER DYSPHAGIA  . Right shoulder pain  . Abnormal chest x-ray  . Right ankle pain  . Low back pain   Past Medical History  Diagnosis Date  . Allergy     allegic rhinitis  . Diabetes mellitus     type II  . Hyperlipidemia   . Hypertension   . GERD (gastroesophageal reflux disease)   . Hyperplastic polyp of intestine 2010  . Esophageal stricture   . History of hernia repair     as a baby  . S/P ear surgery, follow-up exam     for drainage   Past Surgical History  Procedure Date  . Esophagogastroduodenoscopy 04/2009    stricture/GERD   History  Substance Use Topics  . Smoking status: Never Smoker   . Smokeless tobacco: Not on file  . Alcohol Use: Yes     Comment: 6 pk every 3 days; 12 pk/week   Family History  Problem Relation Age of Onset  . Diabetes Mother   . Cancer Father 10    colon cancer  . Diabetes Maternal Aunt   . Cancer Cousin     breast cancer  . Diabetes Other    Allergies  Allergen Reactions  . Codeine     REACTION: anxious   Current Outpatient Prescriptions on File Prior to Visit  Medication Sig Dispense Refill  . atorvastatin (LIPITOR) 40 MG tablet TAKE ONE BY MOUTH DAILY  30 tablet  5  . B-D INS SYR ULTRAFINE 1CC/31G 31G X 5/16" 1 ML MISC       . B-D UF III MINI PEN  NEEDLES 31G X 5 MM MISC       . cyclobenzaprine (FLEXERIL) 10 MG tablet TAKE 1 TABLET BY MOUTH UP TO THREE TIMES A DAY AS NEEDED FOR MUSCLE SPASM/BODY ACHES  30 tablet  0  . fluticasone (FLONASE) 50 MCG/ACT nasal spray USE 2 SPRAYS IN EACH NOSTRIL ONCE A DAY  16 g  5  . HYDROcodone-acetaminophen (VICODIN) 5-500 MG per tablet Take 1 tablet by mouth every 6 (six) hours as needed for pain (careful of sedation).  20 tablet  0  . insulin glargine (LANTUS) 100 UNIT/ML injection Inject 60 Units into the skin every morning.       . Liraglutide (VICTOZA [place]) Inject 1.8 mg into the skin daily. Victoza 0.6mg /6ml soln       . meloxicam (MOBIC) 15 MG tablet Take 1 tablet (15 mg total) by mouth daily. With food as needed for back pain  30 tablet  2  . metFORMIN (GLUCOPHAGE) 1000 MG tablet Take 1,000 mg by mouth 2 (two) times daily with a meal.        .  olmesartan (BENICAR) 40 MG tablet Take 40 mg by mouth daily.        [name] omeprazole (PRILOSEC) 40 MG capsule Take 1 capsule (40 mg total) by mouth daily. Every morning 30 minutes before breakfast meal.  30 capsule  3  . ONE TOUCH ULTRA TEST test strip       . pioglitazone (ACTOS) 45 MG tablet Take 45 mg by mouth daily.        . tadalafil (CIALIS) 20 MG tablet Take 1 tablet (20 mg total) by mouth as directed.  10 tablet  11  . vitamin B-12 (CYANOCOBALAMIN) 1000 MCG tablet Take 1,000 mcg by mouth daily.            Review of Systems Review of Systems  Constitutional: Negative for fever, appetite change, fatigue and unexpected weight change.  ENT pos for congestion/ ear pain, neg for sinus pain or ear drainage  Eyes: Negative for pain and visual disturbance.  Respiratory: Negative for wheeze and shortness of breath.   Cardiovascular: Negative for cp or palpitations    Gastrointestinal: Negative for nausea, diarrhea and constipation.  Genitourinary: Negative for urgency and frequency.  Skin: Negative for pallor or rash   Neurological: Negative for weakness,  light-headedness, numbness and headaches.  Hematological: Negative for adenopathy. Does not bruise/bleed easily.  Psychiatric/Behavioral: Negative for dysphoric mood. The patient is not nervous/anxious.         Objective:   Physical Exam  Constitutional: He appears well-developed and well-nourished. No distress.  HENT:  Head: Normocephalic and atraumatic.  Mouth/Throat: Oropharynx is clear and moist. No oropharyngeal exudate.       Nares are boggy and injected with some clear rhinorrhea  No sinus tenderness TMs are dull/ with small effusions, no erythema or bulging Canals are clear  Eyes: Conjunctivae normal and EOM are normal. Pupils are equal, round, and reactive to light. Right eye exhibits no discharge. Left eye exhibits no discharge.  Neck: Normal range of motion. Neck supple.  Cardiovascular: Normal rate and regular rhythm.   Pulmonary/Chest: Effort normal and breath sounds normal. No respiratory distress. He has no wheezes. He has no rales.  Lymphadenopathy:    He has no cervical adenopathy.  Neurological: He is alert. No cranial nerve deficit.  Skin: Skin is warm and dry. No rash noted.  Psychiatric: He has a normal mood and affect.          Assessment & Plan:

## 2012-08-22 NOTE — Patient Instructions (Addendum)
You have ear tube congestion without infection  Keep using the flonase nasal spray - you can increase it to twice daily for a week if you need to  If ear pain worsens or you run a fever, let us know

## 2012-08-27 ENCOUNTER — Ambulatory Visit (INDEPENDENT_AMBULATORY_CARE_PROVIDER_SITE_OTHER): Payer: 59 | Admitting: Family Medicine

## 2012-08-27 ENCOUNTER — Encounter: Payer: Self-pay | Admitting: Family Medicine

## 2012-08-27 VITALS — BP 120/72 | HR 87 | Temp 98.5°F | Ht 70.0 in | Wt 250.5 lb

## 2012-08-27 DIAGNOSIS — M259 Joint disorder, unspecified: Secondary | ICD-10-CM

## 2012-08-27 DIAGNOSIS — M67919 Unspecified disorder of synovium and tendon, unspecified shoulder: Secondary | ICD-10-CM

## 2012-08-27 DIAGNOSIS — M7580 Other shoulder lesions, unspecified shoulder: Secondary | ICD-10-CM

## 2012-08-27 DIAGNOSIS — M19019 Primary osteoarthritis, unspecified shoulder: Secondary | ICD-10-CM

## 2012-08-27 MED ORDER — HYDROCODONE-ACETAMINOPHEN 5-500 MG PO TABS: 1.0000 | ORAL_TABLET | Freq: Four times a day (QID) | ORAL | Status: DC | PRN

## 2012-08-27 NOTE — Patient Instructions (Addendum)
REFERRAL: GO THE THE FRONT ROOM AT THE ENTRANCE OF OUR CLINIC, NEAR CHECK IN. ASK FOR MARION. SHE WILL HELP YOU SET UP YOUR REFERRAL. DATE: TIME:  

## 2012-08-27 NOTE — Progress Notes (Signed)
[facility] at [facility] [address] [place] [address] Phone: [phone] Fax: [phone]  Date:  08/27/2012   Name:  Mitchell Palmer   DOB:  06/22/1966   MRN:  [id number] Gender: male Age: 47 y.o.  PCP:  Roxy Manns, MD  Evaluating MD: Hannah Beat, MD   Chief Complaint: Shoulder Pain and Follow-up   History of Present Illness:  Mitchell Palmer is a 47 y.o. pleasant patient who presents with the following:  The patient noted above presents with shoulder pain that has been ongoing for > 1 year.  there is no history of trauma or accident. The patient denies neck pain or radicular symptoms. Denies dislocation, subluxation, separation of the shoulder. The patient does complain of pain in the overhead plane, terminal EROM, terminal IROM, and with throwing a softball.  Medications Tried: meloxicam, subac injection x 1 last year Ice or Heat: yes Tried PT: Formal PT last year  Prior shoulder Injury: No Prior surgery: No Prior fracture: No  Handedness:.  R  R shoulder series in 09/2011 last year showed mild ac arthropathy only without significant GH OA  Patient Active Problem List  Diagnosis  . NEOPLASM UNCERTAIN BEHAVIOR OTHER SPEC SITES  . DIABETES MELLITUS, TYPE II  . HYPERLIPIDEMIA  . HYPERTENSION  . ALLERGIC RHINITIS  . ESOPHAGEAL STRICTURE  . GERD  . ABSCESS, NECK  . PLANTAR FASCIITIS, TRAUMATIC  . FOOT PAIN, LEFT  . OTHER DYSPHAGIA  . Right shoulder pain  . Abnormal chest x-ray  . Right ankle pain  . Low back pain  . ETD (eustachian tube dysfunction)    Past Medical History  Diagnosis Date  . Allergy     allegic rhinitis  . Diabetes mellitus     type II  . Hyperlipidemia   . Hypertension   . GERD (gastroesophageal reflux disease)   . Hyperplastic polyp of intestine 2010  . Esophageal stricture   . History of hernia repair     as a baby  . S/P ear surgery, follow-up exam     for drainage    Past Surgical History    Procedure Date  . Esophagogastroduodenoscopy 04/2009    stricture/GERD    History  Substance Use Topics  . Smoking status: Never Smoker   . Smokeless tobacco: Not on file  . Alcohol Use: Yes     Comment: 6 pk every 3 days; 12 pk/week    Family History  Problem Relation Age of Onset  . Diabetes Mother   . Cancer Father 82    colon cancer  . Diabetes Maternal Aunt   . Cancer Cousin     breast cancer  . Diabetes Other     Allergies  Allergen Reactions  . Codeine     REACTION: anxious    Medication list has been reviewed and updated.  Outpatient Prescriptions Prior to Visit  Medication Sig Dispense Refill  . atorvastatin (LIPITOR) 40 MG tablet TAKE ONE BY MOUTH DAILY  30 tablet  5  . B-D INS SYR ULTRAFINE 1CC/31G 31G X 5/16" 1 ML MISC       . B-D UF III MINI PEN NEEDLES 31G X 5 MM MISC       . cyclobenzaprine (FLEXERIL) 10 MG tablet TAKE 1 TABLET BY MOUTH UP TO THREE TIMES A DAY AS NEEDED FOR MUSCLE SPASM/BODY ACHES  30 tablet  0  . fluticasone (FLONASE) 50 MCG/ACT nasal spray USE 2 SPRAYS IN EACH NOSTRIL ONCE A DAY  16 g  5  . HYDROcodone-acetaminophen (VICODIN) 5-500 MG per tablet Take 1 tablet by mouth every 6 (six) hours as needed for pain (careful of sedation).  20 tablet  0  . insulin glargine (LANTUS) 100 UNIT/ML injection Inject 60 Units into the skin every morning.       . Liraglutide (VICTOZA [place]) Inject 1.8 mg into the skin daily. Victoza 0.6mg /57ml soln       . meloxicam (MOBIC) 15 MG tablet Take 1 tablet (15 mg total) by mouth daily. With food as needed for back pain  30 tablet  2  . metFORMIN (GLUCOPHAGE) 1000 MG tablet Take 1,000 mg by mouth 2 (two) times daily with a meal.        . olmesartan (BENICAR) 40 MG tablet Take 1 tablet (40 mg total) by mouth daily.  30 tablet  11  . omeprazole (PRILOSEC) 40 MG capsule Take 1 capsule (40 mg total) by mouth daily. Every morning 30 minutes before breakfast meal.  30 capsule  3  . ONE TOUCH ULTRA TEST test strip        . pioglitazone (ACTOS) 45 MG tablet Take 45 mg by mouth daily.        . tadalafil (CIALIS) 20 MG tablet Take 1 tablet (20 mg total) by mouth as directed.  10 tablet  11  . vitamin B-12 (CYANOCOBALAMIN) 1000 MCG tablet Take 1,000 mcg by mouth daily.         Last reviewed on 08/22/2012  2:24 PM by Judy Pimple, MD  Review of Systems:   GEN: No fevers, chills. Nontoxic. Primarily MSK c/o today. MSK: Detailed in the HPI GI: tolerating PO intake without difficulty Neuro: No numbness, parasthesias, or tingling associated. Otherwise the pertinent positives of the ROS are noted above.    Physical Examination: BP 120/72  Pulse 87  Temp 98.5 F (36.9 C) (Oral)  Ht 5\' 10"  (1.778 m)  Wt 250 lb 8 oz (113.626 kg)  BMI 35.94 kg/m2  SpO2 97%  Ideal Body Weight: Weight in (lb) to have BMI = 25: 173.9    GEN: Well-developed,well-nourished,in no acute distress; alert,appropriate and cooperative throughout examination HEENT: Normocephalic and atraumatic without obvious abnormalities. Ears, externally no deformities PULM: Breathing comfortably in no respiratory distress EXT: No clubbing, cyanosis, or edema PSYCH: Normally interactive. Cooperative during the interview. Pleasant. Friendly and conversant. Not anxious or depressed appearing. Normal, full affect.  Shoulder: R Inspection: No muscle wasting or winging Ecchymosis/edema: neg  AC joint, scapula, clavicle: NT Cervical spine: NT, full ROM Spurling's: neg Abduction: full, 5/5 Flexion: full, 5/5 IR, full, lift-off: 5/5 ER at neutral: full, 5/5 AC crossover: pos Neer: pos Hawkins: pos Drop Test: neg Empty Can: pos Supraspinatus insertion: mild-mod T Bicipital groove: NT Speed's: neg Yergason's: neg Sulcus sign: neg C5-T1 intact  Neuro: Sensation intact Grip 5/5  RIGHT SHOULDER - 2+ VIEW  Comparison: None.  Findings: Exam slightly limited by patient's habitus.  Sclerotic foci within the right humeral head may represent bone   islands.  Mild acromioclavicular joint degenerative changes.  No fracture or dislocation.  Right apical oblique opacity may represent atelectasis or scarring.  This can be assessed with chest x-ray.  IMPRESSION:  Mild acromioclavicular joint degenerative changes. Please see  above.  Original Report Authenticated By: Fuller Canada, M.D.   Assessment and Plan:  1. Rotator cuff tendinitis  MR Shoulder Right Wo Contrast, Ambulatory referral to Orthopedic Surgery  2. AC joint arthropathy  MR Shoulder Right  Wo Contrast, Ambulatory referral to Orthopedic Surgery   Reviewed HEP, cont with RTC and scapular stabilization.  Failure to improve with conservative care and symptoms > 1 year, obtain an MRI of the R shoulder without contrast to evaluate for potential rotator cuff tear, evaluate AC joint.  Consult Dr. Ave Filter for his opinion, as well.   Orders Today:  Orders Placed This Encounter  Procedures  . MR Shoulder Right Wo Contrast    Standing Status: Future     Number of Occurrences:      Standing Expiration Date: 10/25/2013    EPIC ORDER/ [id number]/NOT CLAUS/INS-[facility]/CLC/MARIAN    Order Specific Question:  Reason for exam:    Answer:  1 year of shoulder pain, recalcitrant, eval cuff    Order Specific Question:  Preferred imaging location?    Answer:  [id number]    Order Specific Question:  Does the patient have a pacemaker, internal devices, implants, aneury    Answer:  No  . Ambulatory referral to Orthopedic Surgery    Referral Priority:  Routine    Referral Type:  Surgical    Referral Reason:  Specialty Services Required    Requested Specialty:  Orthopedic Surgery    Number of Visits Requested:  1    Updated Medication List: (Includes new medications, updates to list, dose adjustments) Meds ordered this encounter  Medications  . HYDROcodone-acetaminophen (VICODIN) 5-500 MG per tablet    Sig: Take 1 tablet by mouth every 6 (six) hours as needed for pain (careful of  sedation).    Dispense:  30 tablet    Refill:  0    Medications Discontinued: Medications Discontinued During This Encounter  Medication Reason  . HYDROcodone-acetaminophen (VICODIN) 5-500 MG per tablet Reorder     Hannah Beat, MD

## 2012-08-28 ENCOUNTER — Other Ambulatory Visit: Payer: Self-pay | Admitting: Family Medicine

## 2012-08-28 DIAGNOSIS — Z139 Encounter for screening, unspecified: Secondary | ICD-10-CM

## 2012-08-29 ENCOUNTER — Ambulatory Visit
Admission: RE | Admit: 2012-08-29 | Discharge: 2012-08-29 | Disposition: A | Discharge status: Home / Self Care | Payer: 59 | Source: Ambulatory Visit | Attending: Family Medicine | Admitting: Family Medicine

## 2012-08-29 DIAGNOSIS — Z139 Encounter for screening, unspecified: Secondary | ICD-10-CM

## 2012-09-01 ENCOUNTER — Ambulatory Visit
Admission: RE | Admit: 2012-09-01 | Discharge: 2012-09-01 | Disposition: A | Discharge status: Home / Self Care | Payer: 59 | Source: Ambulatory Visit | Attending: Family Medicine | Admitting: Family Medicine

## 2012-09-01 ENCOUNTER — Other Ambulatory Visit: Payer: 59

## 2012-09-01 DIAGNOSIS — M7580 Other shoulder lesions, unspecified shoulder: Secondary | ICD-10-CM

## 2012-09-01 DIAGNOSIS — M19019 Primary osteoarthritis, unspecified shoulder: Secondary | ICD-10-CM

## 2012-09-09 ENCOUNTER — Other Ambulatory Visit: Payer: Self-pay | Admitting: Orthopedic Surgery

## 2012-09-22 ENCOUNTER — Encounter (HOSPITAL_BASED_OUTPATIENT_CLINIC_OR_DEPARTMENT_OTHER): Payer: Self-pay | Admitting: *Deleted

## 2012-09-22 NOTE — Progress Notes (Signed)
09/22/12 1644  OBSTRUCTIVE SLEEP APNEA  Have you ever been diagnosed with sleep apnea through a sleep study? No  Do you snore loudly (loud enough to be heard through closed doors)?  1  Do you often feel tired, fatigued, or sleepy during the daytime? 0  Has anyone observed you stop breathing during your sleep? 0  Do you have, or are you being treated for high blood pressure? 1  BMI more than 35 kg/m2? 1  Age over 46 years old? 0  Gender: 1  Obstructive Sleep Apnea Score 4   Score 4 or greater  Results sent to PCP

## 2012-09-22 NOTE — Progress Notes (Signed)
To come in for [id number] No cardiac problems

## 2012-09-23 ENCOUNTER — Encounter (HOSPITAL_BASED_OUTPATIENT_CLINIC_OR_DEPARTMENT_OTHER)
Admission: RE | Admit: 2012-09-23 | Discharge: 2012-09-23 | Disposition: A | Discharge status: Home / Self Care | Payer: 59 | Source: Ambulatory Visit | Attending: Orthopedic Surgery | Admitting: Orthopedic Surgery

## 2012-09-23 LAB — BASIC METABOLIC PANEL
CO2: 28 mEq/L (ref 19–32)
Calcium: 10 mg/dL (ref 8.4–10.5)
Chloride: 97 mEq/L (ref 96–112)
Glucose, Bld: 230 mg/dL — ABNORMAL HIGH (ref 70–99)
Sodium: 135 mEq/L (ref 135–145)

## 2012-09-29 ENCOUNTER — Ambulatory Visit (HOSPITAL_BASED_OUTPATIENT_CLINIC_OR_DEPARTMENT_OTHER)
Admission: RE | Admit: 2012-09-29 | Discharge: 2012-09-29 | Disposition: A | Discharge status: Home / Self Care | Payer: 59 | Source: Ambulatory Visit | Attending: Orthopedic Surgery | Admitting: Orthopedic Surgery

## 2012-09-29 ENCOUNTER — Ambulatory Visit (HOSPITAL_BASED_OUTPATIENT_CLINIC_OR_DEPARTMENT_OTHER): Payer: 59 | Admitting: Anesthesiology

## 2012-09-29 ENCOUNTER — Encounter (HOSPITAL_BASED_OUTPATIENT_CLINIC_OR_DEPARTMENT_OTHER): Payer: Self-pay | Admitting: Anesthesiology

## 2012-09-29 ENCOUNTER — Encounter (HOSPITAL_BASED_OUTPATIENT_CLINIC_OR_DEPARTMENT_OTHER)
Admission: RE | Disposition: A | Discharge status: Home / Self Care | Payer: Self-pay | Source: Ambulatory Visit | Attending: Orthopedic Surgery

## 2012-09-29 ENCOUNTER — Encounter (HOSPITAL_BASED_OUTPATIENT_CLINIC_OR_DEPARTMENT_OTHER): Payer: Self-pay

## 2012-09-29 DIAGNOSIS — X58XXXA Exposure to other specified factors, initial encounter: Secondary | ICD-10-CM | POA: Insufficient documentation

## 2012-09-29 DIAGNOSIS — E119 Type 2 diabetes mellitus without complications: Secondary | ICD-10-CM | POA: Insufficient documentation

## 2012-09-29 DIAGNOSIS — K219 Gastro-esophageal reflux disease without esophagitis: Secondary | ICD-10-CM | POA: Insufficient documentation

## 2012-09-29 DIAGNOSIS — E785 Hyperlipidemia, unspecified: Secondary | ICD-10-CM | POA: Insufficient documentation

## 2012-09-29 DIAGNOSIS — M25819 Other specified joint disorders, unspecified shoulder: Secondary | ICD-10-CM | POA: Insufficient documentation

## 2012-09-29 DIAGNOSIS — I10 Essential (primary) hypertension: Secondary | ICD-10-CM | POA: Insufficient documentation

## 2012-09-29 DIAGNOSIS — M75110 Incomplete rotator cuff tear or rupture of unspecified shoulder, not specified as traumatic: Secondary | ICD-10-CM | POA: Insufficient documentation

## 2012-09-29 DIAGNOSIS — Z9889 Other specified postprocedural states: Secondary | ICD-10-CM

## 2012-09-29 DIAGNOSIS — S43439A Superior glenoid labrum lesion of unspecified shoulder, initial encounter: Secondary | ICD-10-CM | POA: Insufficient documentation

## 2012-09-29 HISTORY — PX: SHOULDER ARTHROSCOPY WITH ROTATOR CUFF REPAIR AND SUBACROMIAL DECOMPRESSION: SHX5686

## 2012-09-29 LAB — POCT HEMOGLOBIN-HEMACUE: Hemoglobin: 15 g/dL (ref 13.0–17.0)

## 2012-09-29 SURGERY — SHOULDER ARTHROSCOPY WITH ROTATOR CUFF REPAIR AND SUBACROMIAL DECOMPRESSION
Anesthesia: General | Site: Shoulder | Laterality: Right | Wound class: Clean

## 2012-09-29 MED ORDER — ONDANSETRON HCL 4 MG/2ML IJ SOLN
INTRAMUSCULAR | Status: DC | PRN
  Administered 2012-09-29: 4 mg via INTRAVENOUS

## 2012-09-29 MED ORDER — HYDROMORPHONE HCL PF 1 MG/ML IJ SOLN: 0.2500 mg | INTRAMUSCULAR | Status: DC | PRN

## 2012-09-29 MED ORDER — CEFAZOLIN SODIUM-DEXTROSE 2-3 GM-% IV SOLR
2.0000 g | INTRAVENOUS | Status: AC
  Administered 2012-09-29: 2 g via INTRAVENOUS

## 2012-09-29 MED ORDER — PROPOFOL 10 MG/ML IV BOLUS
INTRAVENOUS | Status: DC | PRN
  Administered 2012-09-29: 150 mg via INTRAVENOUS
  Administered 2012-09-29: 100 mg via INTRAVENOUS

## 2012-09-29 MED ORDER — SUCCINYLCHOLINE CHLORIDE 20 MG/ML IJ SOLN
INTRAMUSCULAR | Status: DC | PRN
  Administered 2012-09-29: 100 mg via INTRAVENOUS

## 2012-09-29 MED ORDER — MEPERIDINE HCL 25 MG/ML IJ SOLN: 6.2500 mg | INTRAMUSCULAR | Status: DC | PRN

## 2012-09-29 MED ORDER — CHLORHEXIDINE GLUCONATE 4 % EX LIQD: 60.0000 mL | Freq: Once | CUTANEOUS | Status: DC

## 2012-09-29 MED ORDER — OXYCODONE HCL 5 MG PO TABS: 5.0000 mg | ORAL_TABLET | Freq: Once | ORAL | Status: DC | PRN

## 2012-09-29 MED ORDER — LIDOCAINE HCL (CARDIAC) 20 MG/ML IV SOLN
INTRAVENOUS | Status: DC | PRN
  Administered 2012-09-29: 50 mg via INTRAVENOUS

## 2012-09-29 MED ORDER — OXYCODONE HCL 5 MG/5ML PO SOLN: 5.0000 mg | Freq: Once | ORAL | Status: DC | PRN

## 2012-09-29 MED ORDER — SODIUM CHLORIDE 0.9 % IR SOLN
Status: DC | PRN
  Administered 2012-09-29: 6000 mL

## 2012-09-29 MED ORDER — EPHEDRINE SULFATE 50 MG/ML IJ SOLN
INTRAMUSCULAR | Status: DC | PRN
  Administered 2012-09-29 (×2): 5 mg via INTRAVENOUS
  Administered 2012-09-29 (×2): 10 mg via INTRAVENOUS

## 2012-09-29 MED ORDER — FENTANYL CITRATE 0.05 MG/ML IJ SOLN
50.0000 ug | INTRAMUSCULAR | Status: DC | PRN
  Administered 2012-09-29: 100 ug via INTRAVENOUS

## 2012-09-29 MED ORDER — OXYCODONE-ACETAMINOPHEN 5-325 MG PO TABS: 1.0000 | ORAL_TABLET | ORAL | Status: DC | PRN

## 2012-09-29 MED ORDER — FENTANYL CITRATE 0.05 MG/ML IJ SOLN
INTRAMUSCULAR | Status: DC | PRN
  Administered 2012-09-29 (×2): 50 ug via INTRAVENOUS

## 2012-09-29 MED ORDER — MIDAZOLAM HCL 2 MG/2ML IJ SOLN
1.0000 mg | INTRAMUSCULAR | Status: DC | PRN
  Administered 2012-09-29: 2 mg via INTRAVENOUS

## 2012-09-29 MED ORDER — BUPIVACAINE-EPINEPHRINE PF 0.5-1:200000 % IJ SOLN
INTRAMUSCULAR | Status: DC | PRN
  Administered 2012-09-29: 30 mL

## 2012-09-29 MED ORDER — LACTATED RINGERS IV SOLN
INTRAVENOUS | Status: DC
  Administered 2012-09-29 (×2): via INTRAVENOUS

## 2012-09-29 MED ORDER — ONDANSETRON HCL 4 MG/2ML IJ SOLN: 4.0000 mg | Freq: Once | INTRAMUSCULAR | Status: DC | PRN

## 2012-09-29 MED ORDER — DEXAMETHASONE SODIUM PHOSPHATE 4 MG/ML IJ SOLN
INTRAMUSCULAR | Status: DC | PRN
  Administered 2012-09-29: 10 mg via INTRAVENOUS

## 2012-09-29 MED ORDER — MIDAZOLAM HCL 5 MG/5ML IJ SOLN
INTRAMUSCULAR | Status: DC | PRN
  Administered 2012-09-29: 2 mg via INTRAVENOUS

## 2012-09-29 SURGICAL SUPPLY — 82 items
ADH SKN CLS APL DERMABOND .7 (GAUZE/BANDAGES/DRESSINGS)
APL SKNCLS STERI-STRIP NONHPOA (GAUZE/BANDAGES/DRESSINGS)
BENZOIN TINCTURE PRP APPL 2/3 (GAUZE/BANDAGES/DRESSINGS) IMPLANT
BLADE SURG 15 STRL LF DISP TIS (BLADE) IMPLANT
BLADE SURG 15 STRL SS (BLADE)
BLADE SURG ROTATE 9660 (MISCELLANEOUS) IMPLANT
BUR OVAL 4.0 (BURR) ×2 IMPLANT
CANISTER OMNI JUG 16 LITER (MISCELLANEOUS) ×2 IMPLANT
CANISTER SUCTION 2500CC (MISCELLANEOUS) IMPLANT
CANNULA 5.75X71 LONG (CANNULA) ×2 IMPLANT
CANNULA TWIST IN 8.25X7CM (CANNULA) IMPLANT
CHLORAPREP W/TINT 26ML (MISCELLANEOUS) ×2 IMPLANT
CLOTH BEACON ORANGE TIMEOUT ST (SAFETY) ×2 IMPLANT
DECANTER SPIKE VIAL GLASS SM (MISCELLANEOUS) IMPLANT
DERMABOND ADVANCED (GAUZE/BANDAGES/DRESSINGS)
DERMABOND ADVANCED .7 DNX12 (GAUZE/BANDAGES/DRESSINGS) IMPLANT
DRAPE INCISE IOBAN 66X45 STRL (DRAPES) ×2 IMPLANT
DRAPE STERI 35X30 U-POUCH (DRAPES) ×2 IMPLANT
DRAPE SURG 17X23 STRL (DRAPES) ×2 IMPLANT
DRAPE U 20/CS (DRAPES) ×2 IMPLANT
DRAPE U-SHAPE 47X51 STRL (DRAPES) ×2 IMPLANT
DRAPE U-SHAPE 76X120 STRL (DRAPES) ×4 IMPLANT
DRSG PAD ABDOMINAL 8X10 ST (GAUZE/BANDAGES/DRESSINGS) ×2 IMPLANT
ELECT REM PT RETURN 9FT ADLT (ELECTROSURGICAL)
ELECTRODE REM PT RTRN 9FT ADLT (ELECTROSURGICAL) ×1 IMPLANT
GAUZE SPONGE 4X4 16PLY XRAY LF (GAUZE/BANDAGES/DRESSINGS) IMPLANT
GAUZE XEROFORM 1X8 LF (GAUZE/BANDAGES/DRESSINGS) ×2 IMPLANT
GLOVE BIO SURGEON STRL SZ7 (GLOVE) ×2 IMPLANT
GLOVE BIO SURGEON STRL SZ7.5 (GLOVE) ×3 IMPLANT
GLOVE BIOGEL PI IND STRL 7.0 (GLOVE) ×1 IMPLANT
GLOVE BIOGEL PI IND STRL 8 (GLOVE) ×1 IMPLANT
GLOVE BIOGEL PI INDICATOR 7.0 (GLOVE) ×1
GLOVE BIOGEL PI INDICATOR 8 (GLOVE) ×1
GLOVE SKINSENSE NS SZ7.0 (GLOVE) ×1
GLOVE SKINSENSE STRL SZ7.0 (GLOVE) ×1 IMPLANT
GOWN PREVENTION PLUS XLARGE (GOWN DISPOSABLE) ×6 IMPLANT
GOWN PREVENTION PLUS XXLARGE (GOWN DISPOSABLE) ×2 IMPLANT
IV NS IRRIG 3000ML ARTHROMATIC (IV SOLUTION) ×4 IMPLANT
NDL 1/2 CIR CATGUT .05X1.09 (NEEDLE) IMPLANT
NDL SCORPION MULTI FIRE (NEEDLE) IMPLANT
NDL SUT 6 .5 CRC .975X.05 MAYO (NEEDLE) IMPLANT
NEEDLE 1/2 CIR CATGUT .05X1.09 (NEEDLE) IMPLANT
NEEDLE MAYO TAPER (NEEDLE)
NEEDLE SCORPION MULTI FIRE (NEEDLE) IMPLANT
NS IRRIG 1000ML POUR BTL (IV SOLUTION) IMPLANT
PACK ARTHROSCOPY DSU (CUSTOM PROCEDURE TRAY) ×2 IMPLANT
PACK BASIN DAY SURGERY FS (CUSTOM PROCEDURE TRAY) ×2 IMPLANT
PENCIL BUTTON HOLSTER BLD 10FT (ELECTRODE) IMPLANT
RESECTOR FULL RADIUS 4.2MM (BLADE) ×2 IMPLANT
SLEEVE SCD COMPRESS KNEE MED (MISCELLANEOUS) ×2 IMPLANT
SLING ARM FOAM STRAP LRG (SOFTGOODS) IMPLANT
SLING ARM FOAM STRAP MED (SOFTGOODS) IMPLANT
SLING ARM FOAM STRAP XLG (SOFTGOODS) ×2 IMPLANT
SLING ARM IMMOBILIZER MED (SOFTGOODS) IMPLANT
SPONGE GAUZE 4X4 12PLY (GAUZE/BANDAGES/DRESSINGS) ×2 IMPLANT
SPONGE LAP 4X18 X RAY DECT (DISPOSABLE) IMPLANT
STRIP CLOSURE SKIN 1/2X4 (GAUZE/BANDAGES/DRESSINGS) IMPLANT
SUCTION FRAZIER TIP 10 FR DISP (SUCTIONS) IMPLANT
SUPPORT WRAP ARM LG (MISCELLANEOUS) IMPLANT
SUT BONE WAX W31G (SUTURE) IMPLANT
SUT ETHIBOND 2 OS 4 DA (SUTURE) IMPLANT
SUT ETHILON 3 0 PS 1 (SUTURE) ×2 IMPLANT
SUT ETHILON 4 0 PS 2 18 (SUTURE) IMPLANT
SUT FIBERWIRE #2 38 T-5 BLUE (SUTURE)
SUT FIBERWIRE 2-0 18 17.9 3/8 (SUTURE)
SUT MNCRL AB 3-0 PS2 18 (SUTURE) IMPLANT
SUT MNCRL AB 4-0 PS2 18 (SUTURE) IMPLANT
SUT PDS AB 0 CT 36 (SUTURE) IMPLANT
SUT PROLENE 3 0 PS 2 (SUTURE) IMPLANT
SUT VIC AB 0 CT1 18XCR BRD 8 (SUTURE) IMPLANT
SUT VIC AB 0 CT1 8-18 (SUTURE)
SUT VIC AB 2-0 SH 18 (SUTURE) IMPLANT
SUTURE FIBERWR #2 38 T-5 BLUE (SUTURE) IMPLANT
SUTURE FIBERWR 2-0 18 17.9 3/8 (SUTURE) IMPLANT
SYR BULB 3OZ (MISCELLANEOUS) IMPLANT
TOWEL OR 17X24 6PK STRL BLUE (TOWEL DISPOSABLE) ×2 IMPLANT
TOWEL OR NON WOVEN STRL DISP B (DISPOSABLE) ×2 IMPLANT
TUBE CONNECTING 20X1/4 (TUBING) ×2 IMPLANT
TUBING ARTHROSCOPY IRRIG 16FT (MISCELLANEOUS) ×2 IMPLANT
WAND STAR VAC 90 (SURGICAL WAND) ×2 IMPLANT
WATER STERILE IRR 1000ML POUR (IV SOLUTION) ×2 IMPLANT
YANKAUER SUCT BULB TIP NO VENT (SUCTIONS) IMPLANT

## 2012-09-29 NOTE — Progress Notes (Signed)
Assisted Dr. Ossey with right, ultrasound guided, interscalene  block. Side rails up, monitors on throughout procedure. See vital signs in flow sheet. Tolerated Procedure well. 

## 2012-09-29 NOTE — Transfer of Care (Signed)
Immediate Anesthesia Transfer of Care Note  Patient: Mitchell Palmer  Procedure(s) Performed: Procedure(s) with comments: SHOULDER ARTHROSCOPY WITH ROTATOR CUFF REPAIR AND SUBACROMIAL DECOMPRESSION (Right) - Right shoulder arthroscopy with subacromial decompression and distal clavicle excision & Debridement of Labrial Tear  Patient Location: PACU  Anesthesia Type:General and Regional  Level of Consciousness: awake, alert  and oriented  Airway & Oxygen Therapy: Patient Spontanous Breathing and Patient connected to face mask oxygen  Post-op Assessment: Report given to PACU RN and Post -op Vital signs reviewed and stable  Post vital signs: Reviewed and stable  Complications: No apparent anesthesia complications

## 2012-09-29 NOTE — Op Note (Signed)
Procedure(s): SHOULDER ARTHROSCOPY WITH ROTATOR CUFF REPAIR AND SUBACROMIAL DECOMPRESSION Procedure Note  Mitchell Palmer male 47 y.o. 09/29/2012  Procedure(s) and Anesthesia Type: #1 right shoulder arthroscopic debridement of superior labral tear, and partial-thickness subscapularis tear #2 right shoulder arthroscopic subacromial decompression #3 right shoulder arthroscopic distal clavicle excision  Postoperative diagnosis #1 right shoulder type I superior labral tear, and partial-thickness subscapularis tear #2 right shoulder chronic impingement #3 right shoulder symptomatic a.c. joint DJD  Surgeon(s) and Role:    * Mitchell Paris, MD - Primary     Surgeon: Mitchell Palmer   Assistants: Mitchell Lack PA-C (Mitchell Palmer was present and scrubbed throughout the procedure and was essential in positioning, assisting with the camera and instrumentation,, and closure)  Anesthesia: General endotracheal anesthesia with preoperative interscalene block    Procedure Detail   Estimated Blood Loss: Min         Drains: none  Blood Given: none         Specimens: none        Complications:  * No complications entered in OR log *         Disposition: PACU - hemodynamically stable.         Condition: stable    Procedure:   INDICATIONS FOR SURGERY: The patient is 47 y.o. male who has had a long history of right shoulder pain. The pain has been going on for about a year. He had physical therapy and injection therapy without lasting relief. He had an MRI which showed high-grade partial-thickness rotator cuff tearing. Ultimately he was indicated for arthroscopic surgery. He understood risks benefits alternatives to the surgery and wished to go forward with that.  OPERATIVE FINDINGS: Examination under anesthesia: He had some stiffness and external rotation at the side to about 30. Otherwise no stiffness, no instability. Diagnostic Arthroscopy:  Glenoid articular  cartilage: Intact Humeral head articular cartilage: Intact Labrum: He had extensive tearing of the superior labrum with some subluxation superior labrum into the glenohumeral joint. The biceps tendon itself was pristine. Once the extensive tearing of the labrum was debrided the remaining labrum was healthy. It was not felt that the biceps anchor was significantly compromised. Loose bodies: None Synovitis: Significant anterior, rotator interval Articular sided rotator cuff: Intact, very mild elevation of the posterior infraspinatus Bursal sided rotator cuff: Intact Coracoacromial ligament: Moderately frayed indicating chronic impingement, he had a moderate size anterior acromial spur which was taken down the standard acromioplasty. Had symptomatic a.c. joint DJD which was addressed with a distal clavicle excision taking about 8 mm the distal clavicle .  DESCRIPTION OF PROCEDURE: The patient was identified in preoperative  holding area where I personally marked the operative site after  verifying site, side, and procedure with the patient. An interscalene block was given by the attending anesthesiologist the holding area.  The patient was taken back to the operating room where general anesthesia was induced without complication and was placed in the beach-chair position with the back  elevated about 60 degrees and all extremities and head and neck carefully padded and  positioned.   The right upper extremity was then prepped and  draped in a standard sterile fashion. The appropriate time-out  procedure was carried out. The patient did receive IV antibiotics  within 30 minutes of incision.   A small posterior portal incision was made and the arthroscope was introduced into the joint. An anterior portal was then established above the subscapularis using needle localization. Small cannula was placed anteriorly. Diagnostic arthroscopy  was then carried out with findings as described above.  The main  finding in the joint was significant tearing of the superior labrum subluxation labrum into the joint. This was debrided extensively through the anterior portal using the shaver. The ArthroCare was also used. Once the debridement was carried out the labrum no longer subluxated into the joint. The biceps tendon was healthy appearing without tearing or tenosynovitis. The biceps anchor was felt to be intact. Therefore the decision was made not to go forward with any sort of biceps tenodesis. He did have significant synovitis in the anterior joint and the rotator interval. This was debrided with the ArthriCare. The subscapularis also had some mild partial thickness tearing which was debrided with shaver. The remainder of the tendon was intact. The undersurface rotator cuff was carefully examined anterior posterior. It was actually completely intact. Posteriorly there was some mild elevation of the tuberosity of 1-2 mm with some cystic changes but really no fraying or tearing of the tendon. This was somewhat surprising based on the findings on MRI  The arthroscope was then introduced into the subacromial space a standard lateral portal was established with needle localization. The shaver was used through the lateral portal to perform extensive bursectomy. Coracoacromial ligament was examined and found to be moderately frayed.  He had some extensive bursa which was debrided and the bursal surface of the rotator cuff was carefully examined posterior and anterior. There is no partial-thickness tearing or thinning that I can appreciate by palpation  The coracoacromial ligament was taken down off the anterior acromion with the ArthroCare exposing a moderate anterior acromial spur. A high-speed bur was then used through the lateral portal to take down the anterior acromial spur from lateral to medial in a standard acromioplasty.  The acromioplasty was also viewed from the lateral portal and the bur was used as necessary  to ensure that the acromion was completely flat from posterior to anterior.  The distal clavicle was exposed arthroscopically and the bur was used to take off the undersurface for approximately 8 mm from the lateral portal. The bur was then moved to an anterior portal position to complete the distal clavicle excision resecting about 8 mm of the distal clavicle and a smooth even fashion. This was viewed from anterior and lateral portals and felt to be complete.  The arthroscopic equipment was removed from the joint and the portals were closed with 3-0 nylon in an interrupted fashion. Sterile dressings were then applied including Xeroform 4 x 4's ABDs and tape. The patient was then allowed to awaken from general anesthesia, placed in a sling, transferred to the stretcher and taken to the recovery room in stable condition.   POSTOPERATIVE PLAN: The patient will be discharged home today and will followup in one week for suture removal and wound check.

## 2012-09-29 NOTE — Anesthesia Preprocedure Evaluation (Signed)
Anesthesia Evaluation  Patient identified by MRN, date of birth, ID band Patient awake    Reviewed: Allergy & Precautions, H&P , NPO status , Patient's Chart, lab work & pertinent test results  Airway       Dental   Pulmonary          Cardiovascular hypertension, Pt. on medications     Neuro/Psych    GI/Hepatic GERD-  Medicated and Controlled,  Endo/Other  diabetes, Type 2, Oral Hypoglycemic Agents  Renal/GU      Musculoskeletal   Abdominal   Peds  Hematology   Anesthesia Other Findings   Reproductive/Obstetrics                           Anesthesia Physical Anesthesia Plan  ASA: II  Anesthesia Plan: General   Post-op Pain Management:    Induction: Intravenous  Airway Management Planned: Oral ETT  Additional Equipment:   Intra-op Plan:   Post-operative Plan: Extubation in OR  Informed Consent: I have reviewed the patients History and Physical, chart, labs and discussed the procedure including the risks, benefits and alternatives for the proposed anesthesia with the patient or authorized representative who has indicated his/her understanding and acceptance.     Plan Discussed with: CRNA and Surgeon  Anesthesia Plan Comments:         Anesthesia Quick Evaluation

## 2012-09-29 NOTE — H&P (Signed)
Mitchell Palmer is an 47 y.o. male.   Chief Complaint: R shoulder pain HPI: R shoulder RCT, impingement, AC DJD failed conservative management.  Past Medical History  Diagnosis Date  . Allergy     allegic rhinitis  . Diabetes mellitus     type II  . Hyperlipidemia   . Hypertension   . GERD (gastroesophageal reflux disease)   . Hyperplastic polyp of intestine 2010  . Esophageal stricture   . History of hernia repair     as a baby  . S/P ear surgery, follow-up exam     for drainage    Past Surgical History  Procedure Laterality Date  . Esophagogastroduodenoscopy  04/2009    stricture/GERD  . Hernia repair      age 69-midline  . Inner ear surgery  1988    went in behind rt ear-blockage    Family History  Problem Relation Age of Onset  . Diabetes Mother   . Cancer Father 39    colon cancer  . Diabetes Maternal Aunt   . Cancer Cousin     breast cancer  . Diabetes Other    Social History:  reports that he has never smoked. He does not have any smokeless tobacco history on file. He reports that  drinks alcohol. He reports that he does not use illicit drugs.  Allergies:  Allergies  Allergen Reactions  . Codeine     REACTION: anxious    Medications Prior to Admission  Medication Sig Dispense Refill  . atorvastatin (LIPITOR) 40 MG tablet TAKE ONE BY MOUTH DAILY  30 tablet  5  . B-D INS SYR ULTRAFINE 1CC/31G 31G X 5/16" 1 ML MISC       . cyclobenzaprine (FLEXERIL) 10 MG tablet TAKE 1 TABLET BY MOUTH UP TO THREE TIMES A DAY AS NEEDED FOR MUSCLE SPASM/BODY ACHES  30 tablet  0  . fluticasone (FLONASE) 50 MCG/ACT nasal spray USE 2 SPRAYS IN EACH NOSTRIL ONCE A DAY  16 g  5  . HYDROcodone-acetaminophen (VICODIN) 5-500 MG per tablet Take 1 tablet by mouth every 6 (six) hours as needed for pain (careful of sedation).  30 tablet  0  . insulin glargine (LANTUS) 100 UNIT/ML injection Inject 60 Units into the skin every morning.       . Liraglutide (VICTOZA [place]) Inject 1.8 mg into  the skin daily. Victoza 0.6mg /43ml soln       . meloxicam (MOBIC) 15 MG tablet Take 1 tablet (15 mg total) by mouth daily. With food as needed for back pain  30 tablet  2  . metFORMIN (GLUCOPHAGE) 1000 MG tablet Take 1,000 mg by mouth 2 (two) times daily with a meal.        . olmesartan (BENICAR) 40 MG tablet Take 1 tablet (40 mg total) by mouth daily.  30 tablet  11  . omeprazole (PRILOSEC) 40 MG capsule Take 1 capsule (40 mg total) by mouth daily. Every morning 30 minutes before breakfast meal.  30 capsule  3  . pioglitazone (ACTOS) 45 MG tablet Take 45 mg by mouth daily.        . tadalafil (CIALIS) 20 MG tablet Take 1 tablet (20 mg total) by mouth as directed.  10 tablet  11  . B-D UF III MINI PEN NEEDLES 31G X 5 MM MISC       . ONE TOUCH ULTRA TEST test strip       . vitamin B-12 (CYANOCOBALAMIN) 1000 MCG tablet Take 1,000  mcg by mouth daily.          Results for orders placed during the hospital encounter of 09/29/12 (from the past 48 hour(s))  POCT HEMOGLOBIN-HEMACUE     Status: None   Collection Time    09/29/12 10:48 AM      Result Value Range   Hemoglobin 15.0  13.0 - 17.0 g/dL   No results found.  Review of Systems  All other systems reviewed and are negative.    Blood pressure 151/87, pulse 97, temperature 98.9 F (37.2 C), temperature source Oral, resp. rate 20, height 5\' 10"  (1.778 m), weight 113.762 kg (250 lb 12.8 oz), SpO2 100.00%. Physical Exam  Constitutional: He is oriented to person, place, and time. He appears well-developed and well-nourished.  HENT:  Head: Atraumatic.  Eyes: EOM are normal.  Cardiovascular: Intact distal pulses.   Respiratory: Effort normal.  Musculoskeletal:       Right shoulder: He exhibits tenderness and pain. He exhibits no swelling.  Neurological: He is alert and oriented to person, place, and time.  Skin: Skin is warm and dry.  Psychiatric: He has a normal mood and affect.     Assessment/Plan R shoulder RCT, impingement, AC  DJD Plan Arth RCR, SAD, DCR Risks / benefits of surgery discussed Consent on chart  NPO for OR Preop antibiotics   Haji Delaine WILLIAM 09/29/2012, 11:36 AM

## 2012-09-29 NOTE — Anesthesia Postprocedure Evaluation (Signed)
Anesthesia Post Note  Patient: Mitchell Palmer  Procedure(s) Performed: Procedure(s) (LRB): SHOULDER ARTHROSCOPY WITH ROTATOR CUFF REPAIR AND SUBACROMIAL DECOMPRESSION (Right)  Anesthesia type: general  Patient location: PACU  Post pain: Pain level controlled  Post assessment: Patient's Cardiovascular Status Stable  Last Vitals:  Filed Vitals:   09/29/12 1316  BP:   Pulse:   Temp: 36.8 C  Resp: 22    Post vital signs: Reviewed and stable  Level of consciousness: sedated  Complications: No apparent anesthesia complications

## 2012-09-29 NOTE — Anesthesia Procedure Notes (Addendum)
Anesthesia Regional Block:  Interscalene brachial plexus block  Pre-Anesthetic Checklist: ,, timeout performed, Correct Patient, Correct Site, Correct Laterality, Correct Procedure, Correct Position, site marked, Risks and benefits discussed,  Surgical consent,  Pre-op evaluation,  At surgeon's request and post-op pain management  Laterality: Right  Prep: chloraprep       Needles:  Injection technique: Single-shot  Needle Type: Echogenic Stimulator Needle     Needle Length: 5cm 5 cm     Additional Needles:  Procedures: ultrasound guided (picture in chart) and nerve stimulator Interscalene brachial plexus block  Nerve Stimulator or Paresthesia:  Response: 0.4 mA,   Additional Responses:   Narrative:  Start time: 09/29/2012 10:56 AM End time: 09/29/2012 11:08 AM Injection made incrementally with aspirations every 5 mL.  Performed by: Personally  Anesthesiologist: Arta Bruce MD  Additional Notes: Monitors applied. Patient sedated. Sterile prep and drape,hand hygiene and sterile gloves were used. Relevant anatomy identified.Needle position confirmed.Local anesthetic injected incrementally after negative aspiration. Local anesthetic spread visualized around nerve(s). Vascular puncture avoided. No complications. Image printed for medical record.The patient tolerated the procedure well.       Interscalene brachial plexus block Procedure Name: Intubation Date/Time: 09/29/2012 11:47 AM Performed by: Caren Macadam Pre-anesthesia Checklist: Patient identified, Emergency Drugs available, Suction available and Patient being monitored Patient Re-evaluated:Patient Re-evaluated prior to inductionOxygen Delivery Method: Circle System Utilized Preoxygenation: Pre-oxygenation with 100% oxygen Intubation Type: IV induction Ventilation: Two handed mask ventilation required Laryngoscope Size: Miller, 2, Mac and 3 (AC with Mil, KO with MAC) Grade View: Grade II Tube type: Oral Tube  size: 8.0 mm Number of attempts: 3 Airway Equipment and Method: stylet and oral airway Placement Confirmation: ETT inserted through vocal cords under direct vision,  positive ETCO2 and breath sounds checked- equal and bilateral Secured at: 24 cm Tube secured with: Tape Dental Injury: Teeth and Oropharynx as per pre-operative assessment  Difficulty Due To: Difficult Airway- due to reduced neck mobility, Difficult Airway- due to large tongue and Difficult Airway- due to limited oral opening Future Recommendations: Recommend- induction with short-acting agent, and alternative techniques readily available Comments: AC DL with Miller 2 unable to view cords. KO DL wilth MAC 3, unable to pass ett. Used Glidescope, good view of cords, ett passes with ease. =BBS, + eTCO2. ALways able to ventilate with oral airway and two handed mask airway.

## 2012-09-30 ENCOUNTER — Encounter (HOSPITAL_BASED_OUTPATIENT_CLINIC_OR_DEPARTMENT_OTHER): Payer: Self-pay | Admitting: Orthopedic Surgery

## 2012-10-07 ENCOUNTER — Other Ambulatory Visit: Payer: Self-pay | Admitting: *Deleted

## 2012-10-08 MED ORDER — OMEPRAZOLE 40 MG PO CPDR: 40.0000 mg | DELAYED_RELEASE_CAPSULE | Freq: Every day | ORAL | Status: DC

## 2012-10-13 ENCOUNTER — Other Ambulatory Visit: Payer: Self-pay | Admitting: Family Medicine

## 2012-10-13 NOTE — Telephone Encounter (Signed)
refil times one please

## 2012-10-13 NOTE — Telephone Encounter (Signed)
Ok to refill 

## 2012-10-13 NOTE — Telephone Encounter (Signed)
done

## 2012-10-14 ENCOUNTER — Ambulatory Visit (INDEPENDENT_AMBULATORY_CARE_PROVIDER_SITE_OTHER): Payer: 59 | Admitting: Family Medicine

## 2012-10-14 ENCOUNTER — Encounter: Payer: Self-pay | Admitting: Family Medicine

## 2012-10-14 ENCOUNTER — Ambulatory Visit: Payer: 59 | Admitting: Family Medicine

## 2012-10-14 VITALS — BP 132/78 | HR 105 | Temp 98.7°F | Ht 70.0 in | Wt 255.8 lb

## 2012-10-14 DIAGNOSIS — L02429 Furuncle of limb, unspecified: Secondary | ICD-10-CM

## 2012-10-14 DIAGNOSIS — E119 Type 2 diabetes mellitus without complications: Secondary | ICD-10-CM

## 2012-10-14 LAB — COMPREHENSIVE METABOLIC PANEL
ALT: 25 U/L (ref 0–53)
AST: 21 U/L (ref 0–37)
Albumin: 3.7 g/dL (ref 3.5–5.2)
Alkaline Phosphatase: 85 U/L (ref 39–117)
Potassium: 4.5 mEq/L (ref 3.5–5.1)
Sodium: 133 mEq/L — ABNORMAL LOW (ref 135–145)
Total Bilirubin: 0.5 mg/dL (ref 0.3–1.2)
Total Protein: 6.7 g/dL (ref 6.0–8.3)

## 2012-10-14 LAB — HEMOGLOBIN A1C: Hgb A1c MFr Bld: 10 % — ABNORMAL HIGH (ref 4.6–6.5)

## 2012-10-14 MED ORDER — SULFAMETHOXAZOLE-TRIMETHOPRIM 800-160 MG PO TABS: 1.0000 | ORAL_TABLET | Freq: Two times a day (BID) | ORAL | Status: DC

## 2012-10-14 NOTE — Assessment & Plan Note (Signed)
Pt would like ref to [facility] endocrine  Lab today  Per pt - sugars labile after shoulder surg but have been stable up until then

## 2012-10-14 NOTE — Progress Notes (Signed)
Subjective:    Patient ID: Mitchell Palmer, male    DOB: Jul 05, 1966, 47 y.o.   MRN: [id number]  HPI Here with boils on his calf Started symptoms on Thursday No fever  One started to drain yesterday - he used heat on the area- got some pus out  One under it has not opened yet   Surgery on the 10th -shoulder Started PT today  Sugars are a bit labile  Sees Dr Abbey Chatters for DM care but would like to stay within [facility] and have a ref to our new endocrinologist Also needs a1c today  Patient Active Problem List  Diagnosis  . NEOPLASM UNCERTAIN BEHAVIOR OTHER SPEC SITES  . DIABETES MELLITUS, TYPE II  . HYPERLIPIDEMIA  . HYPERTENSION  . ALLERGIC RHINITIS  . ESOPHAGEAL STRICTURE  . GERD  . ABSCESS, NECK  . PLANTAR FASCIITIS, TRAUMATIC  . FOOT PAIN, LEFT  . OTHER DYSPHAGIA  . Right shoulder pain  . Abnormal chest x-ray  . Right ankle pain  . Low back pain  . ETD (eustachian tube dysfunction)   Past Medical History  Diagnosis Date  . Allergy     allegic rhinitis  . Diabetes mellitus     type II  . Hyperlipidemia   . Hypertension   . GERD (gastroesophageal reflux disease)   . Hyperplastic polyp of intestine 2010  . Esophageal stricture   . History of hernia repair     as a baby  . S/P ear surgery, follow-up exam     for drainage   Past Surgical History  Procedure Laterality Date  . Esophagogastroduodenoscopy  04/2009    stricture/GERD  . Hernia repair      age 73-midline  . Inner ear surgery  1988    went in behind rt ear-blockage  . Shoulder arthroscopy with rotator cuff repair and subacromial decompression Right 09/29/2012    Procedure: SHOULDER ARTHROSCOPY WITH ROTATOR CUFF REPAIR AND SUBACROMIAL DECOMPRESSION;  Surgeon: Mable Paris, MD;  Location: [facility];  Service: Orthopedics;  Laterality: Right;  Right shoulder arthroscopy with subacromial decompression and distal clavicle excision & Debridement of Labrial Tear   History  Substance  Use Topics  . Smoking status: Never Smoker   . Smokeless tobacco: Not on file  . Alcohol Use: Yes     Comment: 6 pk every 3 days; 12 pk/week   Family History  Problem Relation Age of Onset  . Diabetes Mother   . Cancer Father 28    colon cancer  . Diabetes Maternal Aunt   . Cancer Cousin     breast cancer  . Diabetes Other    Allergies  Allergen Reactions  . Codeine     REACTION: anxious   Current Outpatient Prescriptions on File Prior to Visit  Medication Sig Dispense Refill  . atorvastatin (LIPITOR) 40 MG tablet TAKE ONE BY MOUTH DAILY  30 tablet  5  . B-D INS SYR ULTRAFINE 1CC/31G 31G X 5/16" 1 ML MISC       . B-D UF III MINI PEN NEEDLES 31G X 5 MM MISC       . cyclobenzaprine (FLEXERIL) 10 MG tablet TAKE 1 TABLET BY MOUTH UP TO THREE TIMES A DAY AS NEEDED FOR MUSCLE SPASM/BODY ACHES  30 tablet  0  . fluticasone (FLONASE) 50 MCG/ACT nasal spray USE 2 SPRAYS IN EACH NOSTRIL ONCE A DAY  16 g  5  . insulin glargine (LANTUS) 100 UNIT/ML injection Inject 60 Units into the skin  every morning.       . Liraglutide (VICTOZA [place]) Inject 1.8 mg into the skin daily. Victoza 0.6mg /71ml soln       . metFORMIN (GLUCOPHAGE) 1000 MG tablet Take 1,000 mg by mouth 2 (two) times daily with a meal.        . olmesartan (BENICAR) 40 MG tablet Take 1 tablet (40 mg total) by mouth daily.  30 tablet  11  . omeprazole (PRILOSEC) 40 MG capsule Take 1 capsule (40 mg total) by mouth daily. Every morning 30 minutes before breakfast meal.  30 capsule  3  . ONE TOUCH ULTRA TEST test strip       . pioglitazone (ACTOS) 45 MG tablet Take 45 mg by mouth daily.        . tadalafil (CIALIS) 20 MG tablet Take 1 tablet (20 mg total) by mouth as directed.  10 tablet  11  . vitamin B-12 (CYANOCOBALAMIN) 1000 MCG tablet Take 1,000 mcg by mouth daily.        . meloxicam (MOBIC) 15 MG tablet TAKE 1 TABLET BY MOUTH DAILY AS NEEDED FOR BACK PAIN. TAKE WITH FOOD  30 tablet  0   No current facility-administered medications on  file prior to visit.       Review of Systems Review of Systems  Constitutional: Negative for fever, appetite change, fatigue and unexpected weight change.  Eyes: Negative for pain and visual disturbance.  Respiratory: Negative for cough and shortness of breath.   Cardiovascular: Negative for cp or palpitations    Gastrointestinal: Negative for nausea, diarrhea and constipation.  Genitourinary: Negative for urgency and frequency.  Skin: Negative for pallor or rash  pos for boils on leg MSK pos for post op pain R shoulder- is tolerable wearing sling Neurological: Negative for weakness, light-headedness, numbness and headaches.  Hematological: Negative for adenopathy. Does not bruise/bleed easily.  Psychiatric/Behavioral: Negative for dysphoric mood. The patient is not nervous/anxious.         Objective:   Physical Exam  Constitutional: He appears well-developed and well-nourished. No distress.  obese and well appearing   HENT:  Head: Normocephalic and atraumatic.  Eyes: Conjunctivae and EOM are normal. Pupils are equal, round, and reactive to light.  Cardiovascular: Normal rate and regular rhythm.   Musculoskeletal: He exhibits tenderness. He exhibits no edema.  Neurological: He is alert. He exhibits normal muscle tone.  Skin: Skin is warm and dry. No rash noted. There is erythema. No pallor.  L lower lateral leg   Boil- upper- 2 cm diameter, freely draining pus (expressed for wound cx and dressed with abx oint) Boil lower- 1 cm with white head- pus easily expressed for wound cx and dressed similarly  Psychiatric: He has a normal mood and affect.          Assessment & Plan:

## 2012-10-14 NOTE — Assessment & Plan Note (Signed)
Several boils on L lateral leg- specimen obt from each for culture  Pt is diabetic - hx of skin infx and was recently in hosp- will cover for mrsa with septra and send off wound cx Will continue cleaning/ dressing and using warm compresses  Update if worse or no improvement in several days- see AVS

## 2012-10-14 NOTE — Patient Instructions (Addendum)
Take septra as directed for infection We will call you with a culture result when it returns  Keep areas clean-use antibiotic ointment also and a loose dressing  If worse- more redness/pain or swelling or if you run a fever, please call  Labs today  We will do endocrinology referral at check out

## 2012-10-17 LAB — WOUND CULTURE: Gram Stain: NONE SEEN

## 2012-10-24 ENCOUNTER — Ambulatory Visit: Payer: 59 | Admitting: Internal Medicine

## 2012-10-31 ENCOUNTER — Telehealth: Payer: Self-pay | Admitting: *Deleted

## 2012-10-31 NOTE — Telephone Encounter (Signed)
Patient says he is not on Mobic and has not called asking about taking any med with food.  ? Wrong chart.

## 2012-10-31 NOTE — Telephone Encounter (Signed)
Pt had shoulder surgery in February and recently had follow-up with surgeon and forgot to ask if he could take Mobic 15mg  for inflammation? Please advise

## 2012-10-31 NOTE — Telephone Encounter (Signed)
Take it only as needed (less is better)- with food- this can be hard on your stomach and cause fluid retention and elevate blood pressure a bit

## 2012-11-03 NOTE — Telephone Encounter (Signed)
I'm not sure why he said that? He has mobic on his med list and he recently has surgery?

## 2012-11-03 NOTE — Telephone Encounter (Signed)
Ok- I am going to sign this - but please let me know if he calls back

## 2012-11-04 ENCOUNTER — Ambulatory Visit (INDEPENDENT_AMBULATORY_CARE_PROVIDER_SITE_OTHER): Payer: 59 | Admitting: Internal Medicine

## 2012-11-04 ENCOUNTER — Encounter: Payer: Self-pay | Admitting: Internal Medicine

## 2012-11-04 ENCOUNTER — Other Ambulatory Visit: Payer: Self-pay | Admitting: *Deleted

## 2012-11-04 VITALS — BP 162/90 | HR 76 | Temp 98.2°F | Resp 18 | Wt 255.0 lb

## 2012-11-04 DIAGNOSIS — E119 Type 2 diabetes mellitus without complications: Secondary | ICD-10-CM

## 2012-11-04 LAB — MICROALBUMIN / CREATININE URINE RATIO
Creatinine,U: 55.3 mg/dL
Microalb Creat Ratio: 0.5 mg/g (ref 0.0–30.0)
Microalb, Ur: 0.3 mg/dL (ref 0.0–1.9)

## 2012-11-04 MED ORDER — "INSULIN SYRINGE-NEEDLE U-100 31G X 5/16"" 1 ML MISC": Status: DC

## 2012-11-04 NOTE — Patient Instructions (Addendum)
Please return in 1 month with your sugar log.  Let's set a goal of 4 lbs lost until then! Please check your sugars at least twice a day: fasting, in am and 1 of the other highlighted sections in your log sheets. Please stop Actos. Please split Lantus in 2 doses: 35 units in am and 35 units at night. I will send you the results of your urinary test through MyChart. Please let me know if you have any questions or if you like me to refer you to nutrition.  Consider the following substitutions: - substitute whole grain for white bread or pasta - substitute brown rice for white rice - substitute 90-calorie flat bread pieces for slices of bread when possible - substitute sweet potatoes or yams for white potatoes - substitute humus for margarine - substitute tofu for cheese when possible - substitute almond or rice milk for regular milk (would not drink soy milk daily due to concern for soy estrogen influence on breast cancer risk) - substitute dark chocolate for other sweets when possible - try not to use frosted cereals, they contain much more sugar than you need in am - substitute water - can add lemon or orange slices for taste - for diet sodas (artificial sweeteners will trick your body that you can eat sweets without getting calories and will lead you to overeating and weight gain in the long run) - do not skip breakfast or other meals (this will slow down the metabolism and will result in more weight gain over time)  - can try smoothies made from fruit and almond/rice milk in am instead of regular breakfast - can also try old-fashioned (not instant) oatmeal made with almond/rice milk in am - order the dressing on the side when eating salad at a restaurant - eat as little meat as possible - can try juicing, but should not forget that juicing will get rid of the fiber, so would alternate with eating raw veg./fruits or drinking smoothies - use as little oil as possible, even when using olive  oil - can dress a salad with a mix of balsamic vinegar and lemon juice, for e.g. - use agave nectar, stevia sugar, or regular sugar rather than artificial sweateners - steam or broil/roast veggies  - snack on veggies/fruit/nuts (unsalted, preferably) when possible, rather than processed foods - reduce or eliminate aspartame in diet (it is in diet sodas, chewing gum, etc) Read the labels!  Patient instructions for Diabetes mellitus type 2:  DIET AND EXERCISE Diet and exercise is an important part of diabetic treatment.  We recommended aerobic exercise in the form of brisk walking (working between 40-60% of maximal aerobic capacity, similar to brisk walking) for 150 minutes per week (such as 30 minutes five days per week) along with 3 times per week performing 'resistance' training (using various gauge rubber tubes with handles) 5-10 exercises involving the major muscle groups (upper body, lower body and core) performing 10-15 repetitions (or near fatigue) each exercise. Start at half the above goal but build slowly to reach the above goals. If limited by weight, joint pain, or disability, we recommend daily walking in a swimming pool with water up to waist to reduce pressure from joints while allow for adequate exercise.    BLOOD GLUCOSES Monitoring your blood glucoses is important for continued management of your diabetes. Please check your blood glucoses 2-4 times a day: fasting, before meals and at bedtime (you can rotate these measurements - e.g. one day check  before the 3 meals, the next day check before 2 of the meals and before bedtime, etc.   HYPOGLYCEMIA (low blood sugar) Hypoglycemia is usually a reaction to not eating, exercising, or taking too much insulin/ other diabetes drugs.  Symptoms include tremors, sweating, hunger, confusion, headache, etc. Treat IMMEDIATELY with 15 grams of Carbs:   4 glucose tablets    cup regular juice/soda   2 tablespoons raisins   4 teaspoons sugar    1 tablespoon honey Recheck blood glucose in 15 mins and repeat above if still symptomatic/blood glucose <100. Please contact our office at [phone] if you have questions about how to next handle your insulin.  RECOMMENDATIONS TO REDUCE YOUR RISK OF DIABETIC COMPLICATIONS: * Take your prescribed MEDICATION(S). * Follow a DIABETIC diet: Complex carbs, fiber rich foods, heart healthy fish twice weekly, (monounsaturated and polyunsaturated) fats * AVOID saturated/trans fats, high fat foods, >2,300 mg salt per day. * EXERCISE at least 5 times a week for 30 minutes or preferably daily.  * DO NOT SMOKE OR DRINK more than 1 drink a day. * Check your FEET every day. Do not wear tightfitting shoes. Contact us if you develop an ulcer * See your EYE doctor once a year or more if needed * Get a FLU shot once a year * Get a PNEUMONIA vaccine every 5 years and once after age 49 years  GOALS:  * Your Hemoglobin A1c of 7%  * Your Systolic BP should be 140 or lower  * Your Diastolic BP should be 80 or lower  * Your HDL (Good Cholesterol) should be 40 or higher  * Your LDL (Bad Cholesterol) should be 100 or lower  * Your Triglycerides should be 150 or lower  * Your Urine microalbumin (kidney function) of <30 * Your Body Mass Index should be 25 or lower  We will be glad to help you achieve these goals. Our telephone number is: [phone].

## 2012-11-04 NOTE — Progress Notes (Signed)
Subjective:     Patient ID: Mitchell Palmer, male   DOB: Jun 25, 1966, 47 y.o.   MRN: [id number]  HPI Mitchell Palmer is a 47 year old man, referred by his PCP, Mitchell Palmer, for management of DM 2, uncontrolled, insulin-dependent, with complications (recent boils on calf, erectile dysfunction). He was previously seen by Mitchell Palmer with [facility], but would like to switch  Endocrinology. Last visit with Mitchell Palmer was a year ago.  He has been diagnosed with diabetes in 2000 (at age 23) on routine exam. At that time his hemoglobin A1c was 11%. She was started on insulin in 2006. His last hemoglobin A1c was 10% on 10/14/2012. At that time, his sugar was 427, and a BUN/creatinine was 12/1.2. He is now on a regimen of: - Lantus 70 units every morning - Victoza 1.8 mg daily - was off for 1 week  - Metformin 1000 mg twice a day (also takes B12 vitamin by mouth daily) - Actos 45 mg daily - off for 1 week He was on Januvia in the past, and also on glyburide/metformin, Avandia.  He is taking his sugars once a day (he has a One Touch ultra meter) but cannot give me an exact range (200's). Lowest: 50, last year. He has hypoglycemia awareness ~80. Most of the time lows happen when he sleeps. He had 5 lows last year. He gets 2x orange candy, and then he eats (PB+crackers or a bowl of cereals). Highest sugar 444 on 09/29/2012, as he was told to not take his Lantus.   His last eye exam was with Mitchell Palmer in 2011, no retinopathy. No numbness or tingling in his legs. Last microalbumin/creatinine ratio was 7 in 2012. He does have chronic kidney disease as shown above.  He works nights: 6 pm - 7:30 am. Works 3 days a week. Sleeps 7-8 h. He had a lot of girl scout cookies.  - b'fast: cereal + milk - lunch: wakes up for this: sandwich - dinner: when gets to work: sandwich + chips - snack: sandwich  Apparently, his sugars were better controlled before his recent shoulder surgery last month  (09/29/2012), and then they became labile. He was getting steroid injections into his shoulder. He also presented to his PCP 3 weeks ago with boils on left lateral leg, draining, he was given Septra, one culture showed staph aureus, but not MRSA. The patient also has a history of hyperlipidemia, hypertension, GERD, esophageal stricture, plantar fasciitis, eustachian tube dysfunction, status post ear surgery history of increased alcohol use.  He has family history of DM2 in his mom, DM1 in niece.   Review of Systems Constitutional: has weight gain, no fatigue, has subjective hypothermia, nocturia more than once Eyes: + blurry vision, no xerophthalmia ENT: no sore throat, no nodules palpated in throat, no dysphagia/odynophagia, no hoarseness, decreased hearing Cardiovascular: no CP/SOB/palpitations/leg swelling Respiratory: no cough/SOB Gastrointestinal: no N/V/plus D/ plus C Musculoskeletal: no muscle/joint aches Skin: no rashes Neurological: no tremors/numbness/tingling/dizziness, headache Psychiatric: no depression/anxiety Problems with erections  Past Medical History  Diagnosis Date  . Allergy     allegic rhinitis  . Diabetes mellitus     type II  . Hyperlipidemia   . Hypertension   . GERD (gastroesophageal reflux disease)   . Hyperplastic polyp of intestine 2010  . Esophageal stricture   . History of hernia repair     as a baby  . S/P ear surgery, follow-up exam     for drainage   Past Surgical History  Procedure Laterality Date  . Esophagogastroduodenoscopy  04/2009    stricture/GERD  . Hernia repair      age 33-midline  . Inner ear surgery  1988    went in behind rt ear-blockage  . Shoulder arthroscopy with rotator cuff repair and subacromial decompression Right 09/29/2012    Procedure: SHOULDER ARTHROSCOPY WITH ROTATOR CUFF REPAIR AND SUBACROMIAL DECOMPRESSION;  Surgeon: Mitchell Paris, MD;  Location: [facility];  Service: Orthopedics;  Laterality:  Right;  Right shoulder arthroscopy with subacromial decompression and distal clavicle excision & Debridement of Labrial Tear   History   Social History  . Marital Status: Single    Spouse Name: N/A    Number of Children: 0  . Years of Education: N/A   Occupational History  . Not on file.   Social History Main Topics  . Smoking status: Never Smoker   . Smokeless tobacco: Never Used  . Alcohol Use: Yes     Comment: 6 pk every 3 days; 12 pk/week  . Drug Use: No  . Sexually Active: Not on file   Medication Sig  . atorvastatin (LIPITOR) 40 MG tablet TAKE ONE BY MOUTH DAILY  . B-D INS SYR ULTRAFINE 1CC/31G 31G X 5/16" 1 ML MISC   . B-D UF III MINI PEN NEEDLES 31G X 5 MM MISC   . cyclobenzaprine (FLEXERIL) 10 MG tablet TAKE 1 TABLET BY MOUTH UP TO THREE TIMES A DAY AS NEEDED FOR MUSCLE SPASM/BODY ACHES  . fluticasone (FLONASE) 50 MCG/ACT nasal spray USE 2 SPRAYS IN EACH NOSTRIL ONCE A DAY  . insulin glargine (LANTUS) 100 UNIT/ML injection Inject 60 Units into the skin every morning.   . Liraglutide (VICTOZA [place]) Inject 1.8 mg into the skin daily. Victoza 0.6mg /22ml soln   . meloxicam (MOBIC) 15 MG tablet TAKE 1 TABLET BY MOUTH DAILY AS NEEDED FOR BACK PAIN. TAKE WITH FOOD  . metFORMIN (GLUCOPHAGE) 1000 MG tablet Take 1,000 mg by mouth 2 (two) times daily with a meal.    . olmesartan (BENICAR) 40 MG tablet Take 1 tablet (40 mg total) by mouth daily.  [name] omeprazole (PRILOSEC) 40 MG capsule Take 1 capsule (40 mg total) by mouth daily. Every morning 30 minutes before breakfast meal.  . ONE TOUCH ULTRA TEST test strip   . oxyCODONE-acetaminophen (PERCOCET) 5-325 MG per tablet Take 1-2 tablets by mouth every 4 (four) hours as needed for pain.  . pioglitazone (ACTOS) 45 MG tablet Take 45 mg by mouth daily.    . tadalafil (CIALIS) 20 MG tablet Take 1 tablet (20 mg total) by mouth as directed.  . vitamin B-12 (CYANOCOBALAMIN) 1000 MCG tablet Take 1,000 mcg by mouth daily.     No current  facility-administered medications on file prior to visit.   Allergies  Allergen Reactions  . Codeine     REACTION: anxious   Family History  Problem Relation Age of Onset  . Diabetes Mother   . Cancer Father 68    colon cancer  . Diabetes Maternal Aunt   . Cancer Cousin     breast cancer  . Diabetes Other    Objective:   Physical Exam BP 162/90  Pulse 76  Temp(Src) 98.2 F (36.8 C)  Resp 18  Wt 255 lb (115.667 kg)  BMI 36.59 kg/m2 Wt Readings from Last 3 Encounters:  11/04/12 255 lb (115.667 kg)  10/14/12 255 lb 12 oz (116.007 kg)  09/29/12 250 lb 12.8 oz (113.762 kg)  Constitutional: overweight, in  NAD Eyes: PERRLA, EOMI, no exophthalmos ENT: moist mucous membranes, no thyromegaly, no cervical lymphadenopathy Cardiovascular: RRR, No MRG Respiratory: CTA B Gastrointestinal: abdomen soft, NT, ND, BS+ Musculoskeletal: no deformities, strength intact in all 4 Skin: moist, warm, no rashes Neurological: no tremor with outstretched hands, DTR normal in all 4  Assessment:     1. DM2, uncontrolled, insulin-dependent, with complications  - furunculosis on calf - erectile dysfunction    Plan:     Patient with long-standing diabetes, with poor control, accentuated by poor diet (frequently eating out, fast food), lack of exercise. He also has an erratic circadian rhythm, working nights 3 out of 5 days a week, which also contributes.  - his sugars continue to be elevated, on a high dose of Lantus, Victoza, Actos and metformin.  - we spent a lot of time talking about his diet, and how he can improve it. I suggested several substitutions (the patient instructions) and underlying the role of the dieting his control of diabetes. - He mentions that his a.m. Sugars became better controlled after he increased the Lantus from 66 to 70,  The fact that increasing the basal insulin to cover his meals would predispose him to lows between meals - I believe that he would benefit from  addition of mealtime insulin, but since the patient is willing to change his diet and start walk, and to try to lose weight, we'll hold off for now - I advised him to split his Lantus into 2 doses of 35 units every 12 hours - we will stop Actos. Discussed side effects. He has been without it for a week, so I advised him not to refill. - continue metformin at target dose - continue Victoza for now - I offered to refer him to nutrition, however he has seen a dietitian in the past and would like to hold off for now - we set a goal of 4 pounds weight loss until the next visit - I advised him to try to take a diet that he thinks he might follow, if anything he cannot do the substitutions that I suggested - Advised him to schedule another visit with his ophthalmologist (Mitchell Palmer) - given sugar log and advised how to fill it (check his sugars twice a day, first fasting, and the second before each of the meals and at bedtime) and to bring it at next appt - given foot care handout and explained the principles - given instructions for hypoglycemia management "15-15 rule" - given diabetic nutrition brochure - will check a microalbumin/creatinine ratio today - I will see the patient back in a month with his CBG log  Office Visit on 11/04/2012  Component Date Value Range Status  . Microalb, Ur 11/04/2012 0.3  0.0 - 1.9 mg/dL Final  . Creatinine,U 16/05/9603 55.3   Final  . Microalb Creat Ratio 11/04/2012 0.5  0.0 - 30.0 mg/g Final

## 2012-12-08 ENCOUNTER — Encounter: Payer: Self-pay | Admitting: Internal Medicine

## 2012-12-09 ENCOUNTER — Encounter: Payer: Self-pay | Admitting: Internal Medicine

## 2012-12-09 ENCOUNTER — Ambulatory Visit (INDEPENDENT_AMBULATORY_CARE_PROVIDER_SITE_OTHER): Payer: 59 | Admitting: Internal Medicine

## 2012-12-09 VITALS — BP 140/88 | HR 77 | Temp 98.0°F | Resp 10 | Wt 247.0 lb

## 2012-12-09 DIAGNOSIS — E119 Type 2 diabetes mellitus without complications: Secondary | ICD-10-CM

## 2012-12-09 MED ORDER — EXENATIDE ER 2 MG ~~LOC~~ SUSR: 2.0000 mg | SUBCUTANEOUS | Status: DC

## 2012-12-09 MED ORDER — INSULIN GLARGINE 100 UNIT/ML ~~LOC~~ SOLN: 35.0000 [IU] | Freq: Two times a day (BID) | SUBCUTANEOUS | Status: DC

## 2012-12-09 NOTE — Progress Notes (Signed)
Subjective:     Patient ID: Mitchell Palmer, male   DOB: 1965/10/21, 47 y.o.   MRN: [id number]  HPI Mr. Creelman is a 47 year old man, referred by his PCP, Dr. Milinda Antis, for management of DM 2, dx in 2000, uncontrolled, insulin-dependent, with complications (CKD stage 3, erectile dysfunction). Last visit with me with a month ago.  His last hemoglobin A1c was 10% on 10/14/2012. At that time, his sugar was 427, and a BUN/creatinine was 12/1.2. He is now on a regimen of: - Lantus 35 units bid - Victoza 1.8 mg daily >> however he did not understand that he is to continue this at last visit, so he is off it for the last month - Metformin 1000 mg twice a day (also takes B12 vitamin by mouth daily) - at last visit, we stopped Actos 45 mg daily He was on Januvia in the past, and also on glyburide/metformin, Avandia.  He is taking his sugars 2-3 a day (he has a One Touch ultra meter): - am: 95-255 - before lunch 86-133 (this is upon waking up in the middle of the day to have lunch) - 2 hours after lunch: 177-252 - Before dinner 97-239 (this is actually before going to work 3/7 days a week) - 2 hours after dinner: 223, only checked once - bedtime 280-312 He admits that some of the sugars are checked after snacking, and he cannot give me exact times for his meals. He considers a Sandwich a snack. Lowest: 86. He has hypoglycemia awareness ~80.   His last eye exam was with Dr. Dione Booze in 2011, no retinopathy. No numbness or tingling in his legs. Last microalbumin/creatinine ratio was 7 in 2012. He does have chronic kidney disease as shown above.  He works nights: 6 pm - 7:30 am. Works 3 days a week. Sleeps 7-8 h.   The patient also has a history of hyperlipidemia, hypertension, GERD, esophageal stricture, plantar fasciitis, eustachian tube dysfunction, status post ear surgery, history of increased alcohol use.   Review of Systems Constitutional: has weight loss, + fatigue, has subjective hypothermia,  nocturia more than once, poor sleep Eyes: no blurry vision, no xerophthalmia ENT: no sore throat, no nodules palpated in throat, no dysphagia/odynophagia, no hoarseness, decreased hearing Cardiovascular: no CP/SOB/palpitations/leg swelling Respiratory: no cough/SOB Gastrointestinal: no N/V/plus D/ plus C Musculoskeletal: no muscle/joint aches Skin: no rashes Neurological: no tremors/numbness/tingling/dizziness, headache Psychiatric: no depression/anxiety Problems with erections   I reviewed pt's medications, allergies, PMH, social hx, family hx and no changes required.  Objective:   Physical Exam BP 140/88  Pulse 77  Temp(Src) 98 F (36.7 C) (Oral)  Resp 10  Wt 247 lb (112.038 kg)  BMI 35.44 kg/m2  SpO2 97% Wt Readings from Last 3 Encounters:  12/09/12 247 lb (112.038 kg)  11/04/12 255 lb (115.667 kg)  10/14/12 255 lb 12 oz (116.007 kg)  Constitutional: obese, in NAD Eyes: PERRLA, EOMI, no exophthalmos ENT: moist mucous membranes, no thyromegaly, no cervical lymphadenopathy Cardiovascular: RRR, No MRG Respiratory: CTA B Gastrointestinal: abdomen soft, NT, ND, BS+ Musculoskeletal: no deformities, strength intact in all 4 Skin: moist, warm, no rashes Neurological: no tremor with outstretched hands, DTR normal in all 4  Assessment:     1. DM2, uncontrolled, insulin-dependent, with complications  - CKD stage 3 - furunculosis on calf - erectile dysfunction    Plan:     Patient with long-standing diabetes, with poor control, accentuated by poor diet and lack of exercise. He also has an erratic circadian  rhythm, working nights 3 out of 5 days a week, which also contributes. However, in the last month, his sugars improved, and he was able to lose 8 pounds. He tells me that he watches his diet and try to make better choices in terms of his bread and snacks. - Will continue Lantus 35 units every 12 hours - continue metformin at target dose - we'll restart Victoza, at low-dose  and advancing to 1.8 mg daily, however, I think he would benefit from Bydureon, due to his erratic mealtimes and the impossibility to commit to taking the Victoza in the morning when he rotates sleep and meal times depending on his work schedule. I did add this to the list and seni it to the pharmacy, and advised the patient to find out if he can afford it before taking Victoza off the list - we set a goal of another 4 pounds weight loss until the next visit  - given a new sugar log  - I will see the patient back in 2 months with his CBG log

## 2012-12-09 NOTE — Patient Instructions (Addendum)
Please return in 2 months with your sugar log. Start Bydureon 2 mg injection once a week. Please let me know if this is not an option for treatment, and I will call in Victoza. If we decide to restart Victoza, then you should restart with the low dose of 0.6 mg daily for 3 days, and if you can tolerate this dose, advance to 1.2 mg daily for another 3 days, then increase to 1.8 mg daily and stay on this dose.

## 2012-12-10 ENCOUNTER — Ambulatory Visit: Payer: 59 | Admitting: Internal Medicine

## 2012-12-11 ENCOUNTER — Other Ambulatory Visit: Payer: Self-pay | Admitting: *Deleted

## 2012-12-11 MED ORDER — METFORMIN HCL 1000 MG PO TABS: 1000.0000 mg | ORAL_TABLET | Freq: Two times a day (BID) | ORAL | Status: DC

## 2012-12-30 ENCOUNTER — Other Ambulatory Visit: Payer: Self-pay | Admitting: Family Medicine

## 2013-01-02 ENCOUNTER — Telehealth: Payer: Self-pay | Admitting: *Deleted

## 2013-01-02 NOTE — Telephone Encounter (Signed)
Mitchell Palmer, please tell him to switch to [facility], then. Is tehre any way he can eat earlier at night? Also, cutting down on fatty foods will greatly help. If not, we need to add mealtime insulin with dinner at next visit.

## 2013-01-02 NOTE — Telephone Encounter (Signed)
Pt called stating that he checked on the cost of the new medication bydureon and it is the same price as the victoza. He wants to know if you want him to continue with victoza or begin bydureon. He has noticed that his blood sugars are high in the morning if he eats late at night. The levels are between 235 and 344. He ate two pieces of pizza after 9 pm and it was 344 this morning. Please advise.

## 2013-01-02 NOTE — Telephone Encounter (Signed)
Called pt and LVM to switch to Bydureon. Asked him if he could eat earlier at night, if not she may need to add a mealtime insulin with dinner at your next office visit. Also, cutting down on fatty foods will help greatly. Advised if he had any other questions or problems, give [place] a call.

## 2013-01-27 ENCOUNTER — Telehealth: Payer: Self-pay | Admitting: Family Medicine

## 2013-01-27 DIAGNOSIS — E785 Hyperlipidemia, unspecified: Secondary | ICD-10-CM

## 2013-01-27 DIAGNOSIS — E119 Type 2 diabetes mellitus without complications: Secondary | ICD-10-CM

## 2013-01-27 DIAGNOSIS — I10 Essential (primary) hypertension: Secondary | ICD-10-CM

## 2013-01-27 NOTE — Telephone Encounter (Signed)
Message copied by Judy Pimple on Tue Jan 27, 2013 10:08 PM ------      Message from: Alvina Chou      Created: Tue Jan 27, 2013 11:54 AM      Regarding: Lab orders for Wednesday, 6.11.14       Labs for f/u appt ------

## 2013-01-28 ENCOUNTER — Telehealth: Payer: Self-pay | Admitting: Family Medicine

## 2013-01-28 ENCOUNTER — Other Ambulatory Visit (INDEPENDENT_AMBULATORY_CARE_PROVIDER_SITE_OTHER): Payer: 59

## 2013-01-28 ENCOUNTER — Other Ambulatory Visit: Payer: Self-pay | Admitting: *Deleted

## 2013-01-28 DIAGNOSIS — E119 Type 2 diabetes mellitus without complications: Secondary | ICD-10-CM

## 2013-01-28 DIAGNOSIS — E785 Hyperlipidemia, unspecified: Secondary | ICD-10-CM

## 2013-01-28 DIAGNOSIS — I10 Essential (primary) hypertension: Secondary | ICD-10-CM

## 2013-01-28 LAB — COMPREHENSIVE METABOLIC PANEL
ALT: 30 U/L (ref 0–53)
AST: 21 U/L (ref 0–37)
CO2: 28 mEq/L (ref 19–32)
Chloride: 101 mEq/L (ref 96–112)
Creatinine, Ser: 1.1 mg/dL (ref 0.4–1.5)
GFR: 78.27 mL/min (ref >60.00)
Sodium: 139 mEq/L (ref 135–145)
Total Bilirubin: 0.4 mg/dL (ref 0.3–1.2)
Total Protein: 6.9 g/dL (ref 6.0–8.3)

## 2013-01-28 LAB — LIPID PANEL
HDL: 48.3 mg/dL (ref >39.00)
Total CHOL/HDL Ratio: 3
VLDL: 42 mg/dL — ABNORMAL HIGH (ref 0.0–40.0)

## 2013-01-28 LAB — HEMOGLOBIN A1C: Hgb A1c MFr Bld: 9.9 % — ABNORMAL HIGH (ref 4.6–6.5)

## 2013-01-28 LAB — LDL CHOLESTEROL, DIRECT: Direct LDL: 65.7 mg/dL

## 2013-01-28 MED ORDER — OMEPRAZOLE 40 MG PO CPDR: 40.0000 mg | DELAYED_RELEASE_CAPSULE | Freq: Every day | ORAL | Status: DC

## 2013-01-28 NOTE — Telephone Encounter (Signed)
Received faxed refill request from pharmacy. Refill sent to pharmacy electronically. 

## 2013-01-28 NOTE — Telephone Encounter (Signed)
Message copied by Judy Pimple on Wed Jan 28, 2013 10:24 PM ------      Message from: Baldomero Lamy      Created: Wed Jan 28, 2013  8:21 AM      Regarding: pt wants tsh added       Pt wants to have a tsh added to his labs today, he says it's been a while since it's been checked.  ------

## 2013-01-29 ENCOUNTER — Ambulatory Visit: Payer: 59

## 2013-01-29 DIAGNOSIS — I10 Essential (primary) hypertension: Secondary | ICD-10-CM

## 2013-01-29 LAB — TSH: TSH: 2.57 u[IU]/mL (ref 0.35–5.50)

## 2013-02-02 ENCOUNTER — Other Ambulatory Visit: Payer: Self-pay | Admitting: Family Medicine

## 2013-02-02 ENCOUNTER — Ambulatory Visit (INDEPENDENT_AMBULATORY_CARE_PROVIDER_SITE_OTHER): Payer: 59 | Admitting: Family Medicine

## 2013-02-02 ENCOUNTER — Encounter: Payer: Self-pay | Admitting: Family Medicine

## 2013-02-02 VITALS — BP 132/85 | HR 86 | Temp 98.7°F | Ht 70.0 in | Wt 242.2 lb

## 2013-02-02 DIAGNOSIS — I10 Essential (primary) hypertension: Secondary | ICD-10-CM

## 2013-02-02 DIAGNOSIS — M545 Low back pain, unspecified: Secondary | ICD-10-CM

## 2013-02-02 DIAGNOSIS — E785 Hyperlipidemia, unspecified: Secondary | ICD-10-CM

## 2013-02-02 MED ORDER — CYCLOBENZAPRINE HCL 10 MG PO TABS: 10.0000 mg | ORAL_TABLET | Freq: Three times a day (TID) | ORAL | Status: DC | PRN

## 2013-02-02 MED ORDER — ATORVASTATIN CALCIUM 40 MG PO TABS: 40.0000 mg | ORAL_TABLET | Freq: Every day | ORAL | Status: DC

## 2013-02-02 NOTE — Assessment & Plan Note (Signed)
bp a bit higher here than at home Overall controlled -on ARB Pt does use naproxen occ - and bp will go up a bit with that- he is aware to use sparingly Disc imp of exercise and dash diet / DM control

## 2013-02-02 NOTE — Patient Instructions (Addendum)
I sent flexeril to the pharmacy for back pain as needed - use with caution Hope you can re start exercise soon Avoid red meat/ fried foods/ egg yolks/ fatty breakfast meats/ butter, cheese and high fat dairy/ and shellfish   A1c is 9.9- you can discuss that with Dr Elvera Lennox  No change in medicine Naproxen can increase blood pressure so just take when you need it

## 2013-02-02 NOTE — Assessment & Plan Note (Signed)
Intermittent with work outs  Uses at night prn  Adv to avoid alcohol with this Send # 30 to pharmacy (last refill in fall)

## 2013-02-02 NOTE — Progress Notes (Signed)
Subjective:    Patient ID: Mitchell Palmer, male    DOB: [birth year], 47 y.o.   MRN: [id number]  HPI Here for f/u of chronic medical problems  Is doing fairly well overall   Wt is down 5 lb from last visit to this clinic  bmi is 34  Had his shoulder surgery-doing PT and improved  Takes naproxen occas  Is starting back with workouts (plays ball) and needs refill on flexeril for occ low back pain without radiation No weakness or numbness Pain does not radiate to either leg Hurts to swing bat   Seeing Dr Elvera Lennox for DM - sees her early in July Sugar runs higher in am when he drinks (he drinks too much on his day off)- he is really going to work on that  Also irratic work schedule  Lab Results  Component Value Date   HGBA1C 9.9* 01/28/2013    bp is stable today  No cp or palpitations or headaches or edema  No side effects to medicines  BP Readings from Last 3 Encounters:  02/02/13 142/82  12/09/12 140/88  11/04/12 162/90       Chemistry      Component Value Date/Time   NA 139 01/28/2013 1158   K 4.5 01/28/2013 1158   CL 101 01/28/2013 1158   CO2 28 01/28/2013 1158   BUN 15 01/28/2013 1158   CREATININE 1.1 01/28/2013 1158      Component Value Date/Time   CALCIUM 10.1 01/28/2013 1158   ALKPHOS 98 01/28/2013 1158   AST 21 01/28/2013 1158   ALT 30 01/28/2013 1158   BILITOT 0.4 01/28/2013 1158      Lab Results  Component Value Date   TSH 2.57 01/29/2013    Hyperlipidemia Lab Results  Component Value Date   CHOL 134 01/28/2013   CHOL 138 07/08/2009   Lab Results  Component Value Date   HDL 48.30 01/28/2013   HDL 40.98 07/08/2009   Lab Results  Component Value Date   LDLCALC 67 07/08/2009   Lab Results  Component Value Date   TRIG 210.0* 01/28/2013   TRIG 74.0 07/08/2009   Lab Results  Component Value Date   CHOLHDL 3 01/28/2013   CHOLHDL 2 07/08/2009   Lab Results  Component Value Date   LDLDIRECT 65.7 01/28/2013   on lipitor 40- ok with that overall  Watches  diet somewhat - but eating late is this hardest adjustment Triglycerides are up - that may depend on sugar    Patient Active Problem List   Diagnosis Date Noted  . Boil, leg 10/14/2012  . ETD (eustachian tube dysfunction) 08/22/2012  . Low back pain 06/17/2012  . Right ankle pain 01/08/2012  . Abnormal chest x-ray 10/15/2011  . Right shoulder pain 10/12/2011  . PLANTAR FASCIITIS, TRAUMATIC 11/09/2009  . FOOT PAIN, LEFT 11/09/2009  . NEOPLASM UNCERTAIN BEHAVIOR OTHER SPEC SITES 10/07/2009  . ABSCESS, NECK 10/07/2009  . ESOPHAGEAL STRICTURE 06/27/2009  . GERD 03/31/2009  . OTHER DYSPHAGIA 03/31/2009  . DIABETES MELLITUS, TYPE II 01/06/2009  . HYPERLIPIDEMIA 01/06/2009  . HYPERTENSION 01/06/2009  . ALLERGIC RHINITIS 01/06/2009   Past Medical History  Diagnosis Date  . Allergy     allegic rhinitis  . Diabetes mellitus     type II  . Hyperlipidemia   . Hypertension   . GERD (gastroesophageal reflux disease)   . Hyperplastic polyp of intestine 2010  . Esophageal stricture   . History of hernia repair     as  a baby  . S/P ear surgery, follow-up exam     for drainage   Past Surgical History  Procedure Laterality Date  . Esophagogastroduodenoscopy  04/2009    stricture/GERD  . Hernia repair      age 74-midline  . Inner ear surgery  1988    went in behind rt ear-blockage  . Shoulder arthroscopy with rotator cuff repair and subacromial decompression Right 09/29/2012    Procedure: SHOULDER ARTHROSCOPY WITH ROTATOR CUFF REPAIR AND SUBACROMIAL DECOMPRESSION;  Surgeon: Mable Paris, MD;  Location: [facility];  Service: Orthopedics;  Laterality: Right;  Right shoulder arthroscopy with subacromial decompression and distal clavicle excision & Debridement of Labrial Tear   History  Substance Use Topics  . Smoking status: Never Smoker   . Smokeless tobacco: Never Used  . Alcohol Use: Yes     Comment: 6 pk every 3 days; 12 pk/week   Family History   Problem Relation Age of Onset  . Diabetes Mother   . Cancer Father 59    colon cancer  . Diabetes Maternal Aunt   . Cancer Cousin     breast cancer  . Diabetes Other    Allergies  Allergen Reactions  . Codeine     REACTION: anxious   Current Outpatient Prescriptions on File Prior to Visit  Medication Sig Dispense Refill  . atorvastatin (LIPITOR) 40 MG tablet TAKE ONE BY MOUTH DAILY  30 tablet  0  . B-D UF III MINI PEN NEEDLES 31G X 5 MM MISC       . cyclobenzaprine (FLEXERIL) 10 MG tablet TAKE 1 TABLET BY MOUTH UP TO THREE TIMES A DAY AS NEEDED FOR MUSCLE SPASM/BODY ACHES  30 tablet  0  . Exenatide (BYDUREON) 2 MG SUSR Inject 2 mg into the skin once a week.  8 each  3  . fluticasone (FLONASE) 50 MCG/ACT nasal spray USE 2 SPRAYS IN EACH NOSTRIL ONCE A DAY  16 g  5  . HYDROcodone-acetaminophen (NORCO/VICODIN) 5-325 MG per tablet       . insulin glargine (LANTUS) 100 UNIT/ML injection Inject 0.35 mLs (35 Units total) into the skin 2 (two) times daily.  10 pen  3  . Insulin Syringe-Needle U-100 (B-D INS SYR ULTRAFINE 1CC/31G) 31G X 5/16" 1 ML MISC Inject 35 units into the skin every 12 hours as directed by physician.  200 each  2  . meloxicam (MOBIC) 15 MG tablet TAKE 1 TABLET BY MOUTH DAILY AS NEEDED FOR BACK PAIN. TAKE WITH FOOD  30 tablet  0  . metFORMIN (GLUCOPHAGE) 1000 MG tablet Take 1 tablet (1,000 mg total) by mouth 2 (two) times daily with a meal.  60 tablet  2  . olmesartan (BENICAR) 40 MG tablet Take 1 tablet (40 mg total) by mouth daily.  30 tablet  11  . omeprazole (PRILOSEC) 40 MG capsule Take 1 capsule (40 mg total) by mouth daily. Every morning 30 minutes before breakfast meal.  30 capsule  5  . ONE TOUCH ULTRA TEST test strip       . oxyCODONE-acetaminophen (PERCOCET) 5-325 MG per tablet Take 1-2 tablets by mouth every 4 (four) hours as needed for pain.      . pioglitazone (ACTOS) 45 MG tablet       . tadalafil (CIALIS) 20 MG tablet Take 1 tablet (20 mg total) by mouth  as directed.  10 tablet  11  . vitamin B-12 (CYANOCOBALAMIN) 1000 MCG tablet Take 1,000 mcg by  mouth daily.         No current facility-administered medications on file prior to visit.    Review of Systems Review of Systems  Constitutional: Negative for fever, appetite change,  and unexpected weight change. pos for fatigue from irratic schedule Eyes: Negative for pain and visual disturbance.  Respiratory: Negative for cough and shortness of breath.   Cardiovascular: Negative for cp or palpitations    Gastrointestinal: Negative for nausea, diarrhea and constipation.  Genitourinary: Negative for urgency and frequency. Neg for excessive thirst   Skin: Negative for pallor or rash   Neurological: Negative for weakness, light-headedness, numbness and headaches.  Hematological: Negative for adenopathy. Does not bruise/bleed easily.  Psychiatric/Behavioral: Negative for dysphoric mood. The patient is not nervous/anxious.         Objective:   Physical Exam  Constitutional: He appears well-developed and well-nourished. No distress.  obese and well appearing   HENT:  Head: Normocephalic and atraumatic.  Mouth/Throat: Oropharynx is clear and moist.  Eyes: Conjunctivae and EOM are normal. Pupils are equal, round, and reactive to light. Right eye exhibits no discharge. Left eye exhibits no discharge. No scleral icterus.  Neck: Normal range of motion. Neck supple. No JVD present. Carotid bruit is not present. No thyromegaly present.  Cardiovascular: Normal rate, regular rhythm, normal heart sounds and intact distal pulses.  Exam reveals no gallop.   Pulmonary/Chest: Effort normal and breath sounds normal. No respiratory distress. He has no wheezes.  No crackles  Abdominal: Soft. Bowel sounds are normal. He exhibits no distension, no abdominal bruit and no mass. There is no tenderness.  Musculoskeletal: He exhibits no edema and no tenderness.  Lymphadenopathy:    He has no cervical adenopathy.   Neurological: He is alert. He has normal reflexes. No cranial nerve deficit. He exhibits normal muscle tone. Coordination normal.  Skin: Skin is warm and dry. No rash noted. No erythema. No pallor.  Psychiatric: He has a normal mood and affect.          Assessment & Plan:

## 2013-02-02 NOTE — Telephone Encounter (Signed)
I sent in his lipitor during his office visit today and he is no longer on meloxicam - please deny these

## 2013-02-02 NOTE — Telephone Encounter (Signed)
Pt was seen today is it okay to refill

## 2013-02-02 NOTE — Assessment & Plan Note (Signed)
Disc goals for lipids and reasons to control them Rev labs with pt Rev low sat fat diet in detail Trig are up poss from poor sugar control - A1c is 9.9 for f/u with Dr Elvera Lennox

## 2013-02-03 NOTE — Telephone Encounter (Signed)
done

## 2013-02-08 ENCOUNTER — Other Ambulatory Visit: Payer: Self-pay | Admitting: Family Medicine

## 2013-02-09 ENCOUNTER — Other Ambulatory Visit: Payer: Self-pay | Admitting: *Deleted

## 2013-02-09 MED ORDER — GLUCOSE BLOOD VI STRP: ORAL_STRIP | Status: DC

## 2013-02-10 ENCOUNTER — Other Ambulatory Visit: Payer: Self-pay | Admitting: Family Medicine

## 2013-02-11 ENCOUNTER — Telehealth: Payer: Self-pay

## 2013-02-11 NOTE — Telephone Encounter (Signed)
Pt wants to ck status of lipitor refill; spoke with Marchelle Folks at [facility] [place]; did not receive electronic refill 02/02/13; gave verbally as instructed from 02/02/13 refill and pt can pick up shortly. Apologized to Pt & advised could pick up med shortly.

## 2013-02-17 ENCOUNTER — Ambulatory Visit (INDEPENDENT_AMBULATORY_CARE_PROVIDER_SITE_OTHER): Payer: 59 | Admitting: Internal Medicine

## 2013-02-17 ENCOUNTER — Encounter: Payer: Self-pay | Admitting: Internal Medicine

## 2013-02-17 VITALS — BP 120/80 | HR 79 | Temp 99.0°F | Resp 10 | Wt 246.0 lb

## 2013-02-17 DIAGNOSIS — E119 Type 2 diabetes mellitus without complications: Secondary | ICD-10-CM

## 2013-02-17 LAB — HM DIABETES EYE EXAM

## 2013-02-17 NOTE — Progress Notes (Signed)
Subjective:     Patient ID: Mitchell Palmer, male   DOB: Jan 18, 1966, 47 y.o.   MRN: [id number]  HPI Mitchell Palmer is a 47 year old man, returning for followup for DM 2, dx in 2000, uncontrolled, insulin-dependent, with complications (CKD stage 3, erectile dysfunction). Last visit with me was 2 months ago.  His last hemoglobin A1c was: Lab Results  Component Value Date   HGBA1C 9.9* 01/28/2013  previously 10% on 10/14/2012.   He is now on a regimen of: - Lantus 35 units bid - Bydureond 2 mg weekly, changed from Victoza 1.8 mg daily after last visit - Metformin 1000 mg twice a day (also takes B12 vitamin by mouth daily) At previous visits, we stopped Actos 45 mg daily. He was on Januvia in the past, and also on glyburide/metformin, Avandia.  He is checking his sugars 2-3 a day (he has a One Touch ultra meter): - am: 95-255 >> 95-183 in last 2 weeks - 2 hours after breakfast; 190-290 now - before lunch 86-133 (this is upon waking up in the middle of the day to have lunch) >> 70-121, but had one value at 59 - 2 hours after lunch: 177-252 >> now 146 and 236 - Before dinner 97-239 (this is actually before going to work 3/7 days a week) >> 101-156 in the last 2 weeks - 2 hours after dinner: 223, only checked once >> 168-270, but had 2 values in the 300s - bedtime 280-312 >> not checking At last visit, he admitted that some of the sugars were checked after snacking. He considers a Sandwich a snack. Lowest: 59x1. He has hypoglycemia awareness ~80.   His last eye exam was with Dr. Dione Palmer in 2011, no retinopathy. No numbness or tingling in his legs. Last microalbumin/creatinine ratio was 7 in 2012. He does have chronic kidney disease as shown above.  He works nights: 6 pm - 7:30 am. Works 3 days a week. Sleeps 7-8 h.   The patient also has a history of hyperlipidemia, hypertension, GERD, esophageal stricture, plantar fasciitis, eustachian tube dysfunction, status post ear surgery, history of  increased alcohol use.   Review of Systems Constitutional: has weight loss, no fatigue Eyes: no blurry vision, no xerophthalmia ENT: no sore throat, no nodules palpated in throat, no dysphagia/odynophagia, no hoarseness Cardiovascular: no CP/SOB/palpitations/leg swelling Respiratory: no cough/SOB Gastrointestinal: no N/V/D/C Musculoskeletal: no muscle/joint aches Skin: no rashes Neurological: no tremors/numbness/tingling/dizziness Problems with erections   I reviewed pt's medications, allergies, PMH, social hx, family hx and no changes required except switching from Victoza to [place].   Objective:   Physical Exam BP 120/80  Pulse 79  Temp(Src) 99 F (37.2 C) (Oral)  Resp 10  Wt 246 lb (111.585 kg)  BMI 35.3 kg/m2  SpO2 96% Wt Readings from Last 3 Encounters:  02/17/13 246 lb (111.585 kg)  02/02/13 242 lb 4 oz (109.884 kg)  12/09/12 247 lb (112.038 kg)  Constitutional: obese, in NAD Eyes: PERRLA, EOMI, no exophthalmos ENT: moist mucous membranes, no thyromegaly, no cervical lymphadenopathy Cardiovascular: RRR, No MRG Respiratory: CTA B Gastrointestinal: abdomen soft, NT, ND, BS+ Musculoskeletal: no deformities, strength intact in all 4 Skin: moist, warm, no rashes Neurological: no tremor with outstretched hands, DTR normal in all 4  Assessment:     1. DM2, uncontrolled, insulin-dependent, with complications  - CKD stage 3 - furunculosis on calf - erectile dysfunction    Plan:     Patient with long-standing diabetes, with poor control, accentuated by poor diet and  lack of exercise. He also has an erratic circadian rhythm, working nights 3 out of 5 days a week. He again tells me that he is watching his diet a little bit better and trying to drink water instead of other drinks. He also cut down on his eating at night, so that he does not eat up to 10 PM. His sugars have improved in the last 2 weeks, however they're still not at goal, being higher 2 hours after his  meals, in the 200s. Bydureon appears not to be enough to cover his meals. I believe he would benefit from Invokana, I explained the mechanism of action, benefits (lower sugars and weight loss) and side effects. He agrees to try it.  - add Invokana, at 100 mg daily, planning to increase to 300 mg daily when I see him back in a month - Will decrease Lantus to 30 units every 12 hours, with the prospect of decreasing it even more in the future - continue metformin at target dose - continue Bydureon and runs out of the prescription in 3 weeks  - we set a goal of 6 pounds weight loss until the next visit  - given a new sugar log  - I will see the patient back in 1 month with his CBG log

## 2013-02-17 NOTE — Patient Instructions (Signed)
Please stop Bydureon. Start Invokana 100 mg in am. Decrease Lantus to 30 units twice a day. Please return in 1 month with your sugar log.

## 2013-02-18 ENCOUNTER — Encounter: Payer: Self-pay | Admitting: Internal Medicine

## 2013-03-12 ENCOUNTER — Other Ambulatory Visit: Payer: Self-pay | Admitting: Internal Medicine

## 2013-03-18 ENCOUNTER — Other Ambulatory Visit: Payer: Self-pay | Admitting: Internal Medicine

## 2013-03-18 DIAGNOSIS — E119 Type 2 diabetes mellitus without complications: Secondary | ICD-10-CM

## 2013-03-18 MED ORDER — CANAGLIFLOZIN 100 MG PO TABS: 100.0000 mg | ORAL_TABLET | Freq: Every day | ORAL | Status: DC

## 2013-03-26 ENCOUNTER — Encounter: Payer: Self-pay | Admitting: Internal Medicine

## 2013-03-26 ENCOUNTER — Ambulatory Visit (INDEPENDENT_AMBULATORY_CARE_PROVIDER_SITE_OTHER): Payer: 59 | Admitting: Internal Medicine

## 2013-03-26 VITALS — BP 118/66 | HR 102 | Ht 71.0 in | Wt 239.0 lb

## 2013-03-26 DIAGNOSIS — E119 Type 2 diabetes mellitus without complications: Secondary | ICD-10-CM

## 2013-03-26 MED ORDER — INSULIN GLARGINE 100 UNIT/ML ~~LOC~~ SOLN: 30.0000 [IU] | Freq: Two times a day (BID) | SUBCUTANEOUS | Status: DC

## 2013-03-26 NOTE — Patient Instructions (Signed)
Please return in 1 month with your sugar log.   Start Januvia 100 mg daily in am.   Continue Lantus, Metformin and Invokana.

## 2013-03-26 NOTE — Progress Notes (Signed)
Subjective:     Patient ID: Mitchell Palmer, male   DOB: [birth year], 47 y.o.   MRN: [id number]  Diabetes  Mitchell Palmer is a 47 year old man, returning for followup for DM 2, dx in 2000, uncontrolled, insulin-dependent, with complications (CKD stage 3, erectile dysfunction). Last visit with me was 1 month ago.  His last hemoglobin A1c was: Lab Results  Component Value Date   HGBA1C 9.9* 01/28/2013  previously 10% on 10/14/2012.   He is now on a regimen of: - Invokana 100 -started at last visit >> has increased urination - Lantus 30 bid, decreased from 35 units bid at last visit - Metformin 1000 mg twice a day (also takes B12 vitamin by mouth daily) At previous visits, we stopped Actos 45 mg daily. He was on Januvia in the past, and also on glyburide/metformin, Avandia. Stopped Bydureon 2 mg weekly when ran out of Rx - ~1 mo ago. Was on Victoza.  He is checking his sugars 2-3 a day (he has a One Touch ultra meter): Reviewed last visit CBG log. Sugars are now very similar: - am: 95-255 >> 95-183 in last 2 weeks >> 150-190s - 2 hours after breakfast; 190-290 >> 200s - before lunch 86-133 (this is upon waking up in the middle of the day to have lunch) >> 70-121, but had one value at 59 >> 150s - 2 hours after lunch: 177-252 >> now 146 and 236 >> not checking - Before dinner 97-239 (this is actually before going to work 3/7 days a week) >> 101-156 >> 150-180s - 2 hours after dinner: 223, only checked once >> 168-270, but had 2 values in the 300s >> 200s - bedtime 280-312 >> 200s and had 1x 300. At last visit, he admitted that some of the sugars were checked after snacking. He considers a Sandwich a snack. Lowest: 70s x 1, at lunchtime. He has hypoglycemia awareness ~80.   - last eye exam was with Dr. Dione Booze in 02/17/2013, mild retinopathy, L>R  - No numbness or tingling in his legs.  - Last microalbumin/creatinine ratio was 7 in 2012. He does have chronic kidney disease - stage 2, last BUN/Cr  15/1.1 in 01/2013   He works nights: 6 pm - 7:30 am. Works 3 days a week. Sleeps 7-8 h.   The patient also has a history of hyperlipidemia, hypertension, GERD, esophageal stricture, plantar fasciitis, eustachian tube dysfunction, status post ear surgery, history of increased alcohol use.   Review of Systems Constitutional: has weight loss, + fatigue; urinating more after starting Invokana, had some weakness Eyes: no blurry vision, no xerophthalmia ENT: no sore throat, no nodules palpated in throat, no dysphagia/odynophagia, no hoarseness Cardiovascular: no CP/SOB/palpitations/leg swelling Respiratory: no cough/SOB Gastrointestinal: no N/V/D/C Musculoskeletal: no muscle/joint aches Skin: no rashes Neurological: no tremors/numbness/tingling/dizziness, + HA Problems with erections   I reviewed pt's medications, allergies, PMH, social hx, family hx and no changes required except as above.   Objective:   Physical Exam BP 118/66  Pulse 102  Ht 5\' 11"  (1.803 m)  Wt 239 lb (108.41 kg)  BMI 33.35 kg/m2  SpO2 96% Wt Readings from Last 3 Encounters:  03/26/13 239 lb (108.41 kg)  02/17/13 246 lb (111.585 kg)  02/02/13 242 lb 4 oz (109.884 kg)  Constitutional: obese, in NAD Eyes: PERRLA, EOMI, no exophthalmos ENT: moist mucous membranes, no thyromegaly, no cervical lymphadenopathy Cardiovascular: RRR, No MRG Respiratory: CTA B Gastrointestinal: abdomen soft, NT, ND, BS+ Musculoskeletal: no deformities, strength intact in all 4  Skin: moist, warm, no rashes  Assessment:     1. DM2, uncontrolled, insulin-dependent, with complications  - CKD stage 2 - furunculosis on calf - erectile dysfunction    Plan:     Patient with long-standing diabetes, with poor control, accentuated by poor diet and lack of exercise. He also has an erratic circadian rhythm, working nights 3 out of 5 days a week.  I advised him to: - continue Invokana, at 100 mg daily, will not  increase to 300 mg daily  since he had some dizziness, BP is lower and he also has tachycardia. Advised to let me know if dizziness does not resolve. He feels that it is getting better. He stays well hydrated. - Will continue Lantus to 30 units every 12 hours - continue metformin at target dose - start Januvia at 100 mg daily - would like to not restart Bydureon b/c large needle - given samples of Invokana and Januvia - sent refill of Lantus to pharmacy - no labs for today, but will get BMP at next visit - I will see the patient back in 1 month with his CBG log

## 2013-04-01 ENCOUNTER — Other Ambulatory Visit: Payer: Self-pay | Admitting: Internal Medicine

## 2013-04-01 ENCOUNTER — Other Ambulatory Visit: Payer: Self-pay | Admitting: *Deleted

## 2013-04-01 MED ORDER — INSULIN GLARGINE 100 UNIT/ML ~~LOC~~ SOLN: 30.0000 [IU] | Freq: Two times a day (BID) | SUBCUTANEOUS | Status: DC

## 2013-04-23 ENCOUNTER — Ambulatory Visit (INDEPENDENT_AMBULATORY_CARE_PROVIDER_SITE_OTHER): Payer: 59 | Admitting: Internal Medicine

## 2013-04-23 ENCOUNTER — Encounter: Payer: Self-pay | Admitting: Internal Medicine

## 2013-04-23 VITALS — BP 132/88 | HR 94 | Temp 98.6°F | Resp 12 | Wt 239.0 lb

## 2013-04-23 DIAGNOSIS — E119 Type 2 diabetes mellitus without complications: Secondary | ICD-10-CM

## 2013-04-23 MED ORDER — CANAGLIFLOZIN 100 MG PO TABS: 100.0000 mg | ORAL_TABLET | Freq: Every day | ORAL | Status: DC

## 2013-04-23 MED ORDER — BROMOCRIPTINE MESYLATE 0.8 MG PO TABS: 2.0000 | ORAL_TABLET | Freq: Every day | ORAL | Status: DC

## 2013-04-23 NOTE — Progress Notes (Signed)
Subjective:     Patient ID: Mitchell Palmer, male   DOB: [birth year], 47 y.o.   MRN: [id number]  Diabetes  Mr. Wordell is a 47 year old man, returning for followup for DM 2, dx in 2000, uncontrolled, insulin-dependent, with complications (CKD stage 3, erectile dysfunction). Last visit with me was 1 month ago.  His last hemoglobin A1c was: Lab Results  Component Value Date   HGBA1C 9.9* 01/28/2013  previously 10% on 10/14/2012.   He is now on a regimen of: - Metformin 1000 mg twice a day (also takes B12 vitamin po daily) - Lantus 30 bid - Invokana 100 - Januvia 100 started at last visit, 03/2013 At previous visits, we stopped Actos 45 mg daily.  He was on Januvia in the past, and also on glyburide/metformin, Avandia. Stopped Bydureon 2 mg weekly when ran out of Rx. Was on Victoza, also.  He is checking his sugars 2-3 a day (he has a One Touch ultra meter): Reviewed last visit CBG log. Sugars are now very similar: - am: 95-255 >> 95-183 in last 2 weeks >> 150-190s >> 137-194 - 2 hours after breakfast; 190-290 >> 200s >> 155-286 - before lunch 86-133 (this is upon waking up in the middle of the day to have lunch) >> 70-121, but had one value at 59 >> 150s >> 132, one value - 2 hours after lunch: 177-252 >> now 146 and 236 >> not checking >> 167, one value - Before dinner 97-239 (this is actually before going to work 3/7 days a week) >> 101-156 >> 150-180s >> 87-225 - 2 hours after dinner: 223, only checked once >> 168-270, but had 2 values in the 300s >> 200s >> 200-282 - bedtime 280-312 >> 200s and had 1x 300. At last visit, he admitted that some of the sugars were checked after snacking. He considers a Sandwich a snack. Lowest: 70s x 1, at lunchtime. He has hypoglycemia awareness ~80.   - Has HL (increased TG). Last lipid panel: Lab Results  Component Value Date   CHOL 134 01/28/2013   HDL 48.30 01/28/2013   LDLCALC 67 07/08/2009   LDLDIRECT 65.7 01/28/2013   TRIG 210.0* 01/28/2013    CHOLHDL 3 01/28/2013  He is on Atorvastatin. - last eye exam was with Dr. Dione Booze in 02/17/2013, mild retinopathy, L>R  - No numbness or tingling in his legs.  - Last microalbumin/creatinine ratio was 7 in 2012. He does have chronic kidney disease - stage 2, last BUN/Cr:  Lab Results  Component Value Date   BUN 15 01/28/2013   CREATININE 1.1 01/28/2013  He is on Olmestartan.  He eats a later dinner these days.  He works nights: 6 pm - 7:30 am. Works 3 days a week.   The patient also has a history of hyperlipidemia, hypertension, GERD, esophageal stricture, plantar fasciitis, eustachian tube dysfunction, status post ear surgery, history of increased alcohol use.   Review of Systems Constitutional: has weight loss, + fatigue; urinating more after starting Invokana, had some weakness Eyes: no blurry vision, no xerophthalmia ENT: no sore throat, no nodules palpated in throat, no dysphagia/odynophagia, no hoarseness Cardiovascular: no CP/SOB/palpitations/leg swelling Respiratory: no cough/SOB Gastrointestinal: no N/V/D/C Musculoskeletal: no muscle/joint aches Skin: no rashes  I reviewed pt's medications, allergies, PMH, social hx, family hx and no changes required except as above.   Objective:   Physical Exam BP 132/88  Pulse 94  Temp(Src) 98.6 F (37 C) (Oral)  Resp 12  Wt 239 lb (108.41 kg)  BMI 33.35 kg/m2  SpO2 96% Wt Readings from Last 3 Encounters:  04/23/13 239 lb (108.41 kg)  03/26/13 239 lb (108.41 kg)  02/17/13 246 lb (111.585 kg)  Constitutional: obese, in NAD Eyes: PERRLA, EOMI, no exophthalmos ENT: moist mucous membranes, no thyromegaly, no cervical lymphadenopathy Cardiovascular: RRR, No MRG Respiratory: CTA B Gastrointestinal: abdomen soft, NT, ND, BS+ Musculoskeletal: no deformities, strength intact in all 4 Skin: moist, warm, no rashes Foot exam performed today.Normal.  Assessment:     1. DM2, uncontrolled, insulin-dependent, with complications  - CKD  stage 2 - furunculosis on calf - erectile dysfunction    Plan:     Patient with long-standing diabetes, with poor control, accentuated by poor diet and lack of exercise. He also has an erratic circadian rhythm, working nights 3 out of 5 days a week. He has lost 7 lbs after starting invokana, stable weight in last month. Sugars very variable, still above target. I advised him to: - continue Invokana, at 100 mg daily, will not  increase to 300 mg daily since he has some dizziness. He stays well hydrated. - continue Lantus to 30 units every 12 hours - continue metformin at target dose - continue Januvia at 100 mg daily until run out, then start Bromocriptine at 0.8 mg daily for ~1 week, then increase to 1.6 mg daily. Discussed possible SEs of Bromocriptine.  - Discussed the possibility of a VGo system - given brochure. Will hold off for now.  - given samples of Invokana  - no labs for today - will add HbA1C to his next lab draw by PCP - I will see the patient back in 1 month with his CBG log

## 2013-04-23 NOTE — Patient Instructions (Signed)
Please add Cycloset (Bromocriptine) - 0.8 mg daily x 1 week, then increase to 1.6 mg daily - taken in am right before breakfast.  Continue Januvia until you run out, then continue only Metformin, Lantus and Invokana. Please return in 1 month with your sugar log.

## 2013-05-26 ENCOUNTER — Encounter: Payer: Self-pay | Admitting: Internal Medicine

## 2013-05-26 ENCOUNTER — Other Ambulatory Visit: Payer: Self-pay | Admitting: Family Medicine

## 2013-05-26 ENCOUNTER — Ambulatory Visit (INDEPENDENT_AMBULATORY_CARE_PROVIDER_SITE_OTHER): Payer: 59 | Admitting: Internal Medicine

## 2013-05-26 VITALS — BP 120/76 | HR 81 | Temp 98.5°F | Resp 12 | Wt 242.0 lb

## 2013-05-26 DIAGNOSIS — E119 Type 2 diabetes mellitus without complications: Secondary | ICD-10-CM

## 2013-05-26 LAB — BASIC METABOLIC PANEL
BUN: 15 mg/dL (ref 6–23)
CO2: 28 mEq/L (ref 19–32)
Chloride: 103 mEq/L (ref 96–112)
Glucose, Bld: 175 mg/dL — ABNORMAL HIGH (ref 70–99)
Potassium: 4.7 mEq/L (ref 3.5–5.1)

## 2013-05-26 MED ORDER — GLUCOSE BLOOD VI STRP: ORAL_STRIP | Status: DC

## 2013-05-26 MED ORDER — OMEPRAZOLE 40 MG PO CPDR: 40.0000 mg | DELAYED_RELEASE_CAPSULE | Freq: Every day | ORAL | Status: DC

## 2013-05-26 MED ORDER — CANAGLIFLOZIN 300 MG PO TABS: 1.0000 | ORAL_TABLET | Freq: Every day | ORAL | Status: DC

## 2013-05-26 MED ORDER — INSULIN GLARGINE 100 UNIT/ML ~~LOC~~ SOLN: 30.0000 [IU] | Freq: Two times a day (BID) | SUBCUTANEOUS | Status: DC

## 2013-05-26 NOTE — Telephone Encounter (Signed)
Please refill for 6 mo 

## 2013-05-26 NOTE — Telephone Encounter (Signed)
Electronic refill request, please advise  

## 2013-05-26 NOTE — Progress Notes (Signed)
Subjective:     Patient ID: Mitchell Palmer, male   DOB: 10-Oct-1965, 47 y.o.   MRN: [id number]  Diabetes  Mr. Mitchell Palmer is a 47 year old man, returning for followup for DM 2, dx in 2000, uncontrolled, insulin-dependent, with complications (CKD stage 3, erectile dysfunction). Last visit with me was 1 month ago.  His last hemoglobin A1c was: Lab Results  Component Value Date   HGBA1C 9.9* 01/28/2013  previously 10% on 10/14/2012.   He is now on a regimen of: - Metformin 1000 mg twice a day (also takes B12 vitamin po daily) - Lantus 30 bid - Invokana 100 - Cycloset - started 04/22/2013 >> at 1.6 mg daily. At previous visits, we stopped Actos 45 mg daily.  He was on Januvia in the past, and also on glyburide/metformin, Avandia. Stopped Bydureon 2 mg weekly when ran out of Rx. Was on Victoza, also.  He is checking his sugars 2-3 a day (he has a One Touch ultra meter): Reviewed last visit CBG log. Sugars are now very similar: - am: 95-255 >> 95-183 in last 2 weeks >> 150-190s >> 137-194 >> 138-183  - 2 hours after breakfast; 190-290 >> 200s >> 155-286 >> 183-220 - before lunch 86-133 (this is upon waking up in the middle of the day to have lunch) >> 70-121, but had one value at 59 >> 150s >> 132, one value >> 83-150 - 2 hours after lunch: 177-252 >> now 146 and 236 >> not checking >> 167, one value >> 240, one value - Before dinner 97-239 (this is actually before going to work 3/7 days a week) >> 101-156 >> 150-180s >> 87-225 >> 135-160 - 2 hours after dinner: 223, only checked once >> 168-270, but had 2 values in the 300s >> 200s >> 200-282 >> 220-250 - bedtime 280-312 >> 200s and had 1x 300 >> up to 300 (pizza) At last visit, he admitted that some of the sugars were checked after snacking. He considers a sandwich a snack. Lowest: 83 x 3, at lunchtime. He has hypoglycemia awareness ~80.   - Has HL (increased TG). Last lipid panel: Lab Results  Component Value Date   CHOL 134 01/28/2013   HDL 48.30 01/28/2013   LDLCALC 67 07/08/2009   LDLDIRECT 65.7 01/28/2013   TRIG 210.0* 01/28/2013   CHOLHDL 3 01/28/2013  He is on Atorvastatin. - last eye exam was with Dr. Dione Booze in 02/17/2013, mild retinopathy, L>R  - No numbness or tingling in his legs.  - Last microalbumin/creatinine ratio was 7 in 2012. He does have chronic kidney disease - stage 2, last BUN/Cr:  Lab Results  Component Value Date   BUN 15 01/28/2013   CREATININE 1.1 01/28/2013  He is on Olmestartan.  He eats a later dinner these days.  He works nights: 6 pm - 7:30 am. Works 3 days a week.   The patient also has a history of hyperlipidemia, hypertension, GERD, esophageal stricture, plantar fasciitis, eustachian tube dysfunction, status post ear surgery, history of increased alcohol use.   Review of Systems Constitutional: + weight gain, no fatigue;+ nocturia Eyes: no blurry vision, no xerophthalmia ENT: + sore throat, no nodules palpated in throat, no dysphagia/odynophagia, no hoarseness Cardiovascular: no CP/SOB/palpitations/leg swelling Respiratory: no cough/SOB Gastrointestinal: no N/V/D/C Musculoskeletal: no muscle/joint aches Skin: no rashes + diffic. With erections  I reviewed pt's medications, allergies, PMH, social hx, family hx and no changes required except as above.   Objective:   Physical Exam BP 120/76  Pulse  81  Temp(Src) 98.5 F (36.9 C) (Oral)  Resp 12  Wt 242 lb (109.77 kg)  BMI 33.77 kg/m2  SpO2 96% Wt Readings from Last 3 Encounters:  05/26/13 242 lb (109.77 kg)  04/23/13 239 lb (108.41 kg)  03/26/13 239 lb (108.41 kg)  Constitutional: obese, in NAD Eyes: PERRLA, EOMI, no exophthalmos ENT: moist mucous membranes, no thyromegaly, no cervical lymphadenopathy Cardiovascular: RRR, No MRG Respiratory: CTA B Gastrointestinal: abdomen soft, NT, ND, BS+ Musculoskeletal: no deformities, strength intact in all 4 Skin: moist, warm, no rashes  Assessment:     1. 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 He also has an erratic circadian rhythm, working nights 3 out of 5 days a week. At last visit we added Cycloset, but I believe this is not very efficient in him as he has a variable circadian rhythm, working 3/7 nights.  I advised him to: - Increase Invokana, at 300 mg daily, since his dizziness resolved. He stays well hydrated. Advised to take this before breakfast.  - continue Lantus to 30 units every 12 hours - refilled (vials) - continue metformin at target dose - continue Cycloset 1.6 mg daily for now  - At last visit, we discussed the possibility of a VGo system - given brochure. Will hold off for now.  - check HbA1C and BMP today - refuses flu vaccine as he is recovering from an URI - I will see the patient back in 1 month with his CBG log   Office Visit on 05/26/2013  Component Date Value Range Status  . Sodium 05/26/2013 139  135 - 145 mEq/L Final  . Potassium 05/26/2013 4.7  3.5 - 5.1 mEq/L Final  . Chloride 05/26/2013 103  96 - 112 mEq/L Final  . CO2 05/26/2013 28  19 - 32 mEq/L Final  . Glucose, Bld 05/26/2013 175* 70 - 99 mg/dL Final  . BUN 66/44/0347 15  6 - 23 mg/dL Final  . Creatinine, Ser 05/26/2013 1.1  0.4 - 1.5 mg/dL Final  . Calcium 42/59/5638 9.6  8.4 - 10.5 mg/dL Final  . GFR 75/64/3329 78.16  >60.00 mL/min Final  . Hemoglobin A1C 05/26/2013 9.5* 4.6 - 6.5 % Final   Glycemic Control Guidelines for People with Diabetes:Non Diabetic:  <6%Goal of Therapy: <7%Additional Action Suggested:  >8%    HbA1c only a little better. See plan above.

## 2013-05-26 NOTE — Patient Instructions (Signed)
Please return in 1 month with your sugar log.   Please increase Invokana to 300 mg daily, at the beginning of your day.   Continue the rest of your regimen.  Please stop at the lab.

## 2013-05-28 ENCOUNTER — Other Ambulatory Visit: Payer: Self-pay | Admitting: Family Medicine

## 2013-05-28 MED ORDER — OMEPRAZOLE 40 MG PO CPDR: 40.0000 mg | DELAYED_RELEASE_CAPSULE | Freq: Every day | ORAL | Status: DC

## 2013-05-29 ENCOUNTER — Encounter: Payer: Self-pay | Admitting: Family Medicine

## 2013-05-29 ENCOUNTER — Ambulatory Visit (INDEPENDENT_AMBULATORY_CARE_PROVIDER_SITE_OTHER): Payer: 59 | Admitting: Family Medicine

## 2013-05-29 VITALS — BP 120/78 | HR 106 | Temp 98.4°F | Ht 71.0 in | Wt 241.5 lb

## 2013-05-29 DIAGNOSIS — J029 Acute pharyngitis, unspecified: Secondary | ICD-10-CM

## 2013-05-29 NOTE — Assessment & Plan Note (Signed)
Neg rapid strep. Symptomatic care.

## 2013-05-29 NOTE — Patient Instructions (Signed)
Ibuprofen 600-800 mg every 6-8 hours for pain.  Symptomatic care.  Call if not improving in next 5-7 days.

## 2013-05-29 NOTE — Progress Notes (Signed)
  Subjective:    Patient ID: Mitchell Palmer, male    DOB: August 11, 1966, 47 y.o.   MRN: [id number]  Sore Throat  This is a new problem. The current episode started in the past 7 days. The problem has been gradually improving. Neither side of throat is experiencing more pain than the other. There has been no fever. The pain is moderate. Associated symptoms include congestion, swollen glands and trouble swallowing. Pertinent negatives include no coughing, drooling, ear discharge, ear pain, headaches, hoarse voice, plugged ear sensation, neck pain or shortness of breath. Associated symptoms comments: Red bumps in throat. He has had no exposure to strep or mono. He has tried gargles (theraflu) for the symptoms. The treatment provided moderate relief.      Review of Systems  HENT: Positive for congestion and trouble swallowing. Negative for drooling, ear discharge, ear pain and hoarse voice.   Respiratory: Negative for cough and shortness of breath.   Musculoskeletal: Negative for neck pain.  Neurological: Negative for headaches.       Objective:   Physical Exam  Constitutional: Vital signs are normal. He appears well-developed and well-nourished.  Non-toxic appearance. He does not appear ill. No distress.  HENT:  Head: Normocephalic and atraumatic.  Right Ear: Hearing, tympanic membrane, external ear and ear canal normal. No tenderness. No foreign bodies. Tympanic membrane is not retracted and not bulging.  Left Ear: Hearing, tympanic membrane, external ear and ear canal normal. No tenderness. No foreign bodies. Tympanic membrane is not retracted and not bulging.  Nose: Nose normal. No mucosal edema or rhinorrhea. Right sinus exhibits no maxillary sinus tenderness and no frontal sinus tenderness. Left sinus exhibits no maxillary sinus tenderness and no frontal sinus tenderness.  Mouth/Throat: Uvula is midline and mucous membranes are normal. Normal dentition. No dental caries. Posterior  oropharyngeal edema and posterior oropharyngeal erythema present. No oropharyngeal exudate or tonsillar abscesses.  Ulcer on uvula  Eyes: Conjunctivae, EOM and lids are normal. Pupils are equal, round, and reactive to light. Lids are everted and swept, no foreign bodies found.  Neck: Trachea normal, normal range of motion and phonation normal. Neck supple. Carotid bruit is not present. No mass and no thyromegaly present.  Cardiovascular: Normal rate, regular rhythm, S1 normal, S2 normal, normal heart sounds, intact distal pulses and normal pulses.  Exam reveals no gallop.   No murmur heard. Pulmonary/Chest: Effort normal and breath sounds normal. No respiratory distress. He has no wheezes. He has no rhonchi. He has no rales.  Abdominal: Soft. Normal appearance and bowel sounds are normal. There is no hepatosplenomegaly. There is no tenderness. There is no rebound, no guarding and no CVA tenderness. No hernia.  Neurological: He is alert. He has normal reflexes.  Skin: Skin is warm, dry and intact. No rash noted.  Psychiatric: He has a normal mood and affect. His speech is normal and behavior is normal. Judgment normal.          Assessment & Plan:

## 2013-06-03 ENCOUNTER — Ambulatory Visit (INDEPENDENT_AMBULATORY_CARE_PROVIDER_SITE_OTHER)
Admission: RE | Admit: 2013-06-03 | Discharge: 2013-06-03 | Disposition: A | Discharge status: Home / Self Care | Payer: 59 | Source: Ambulatory Visit | Attending: Family Medicine | Admitting: Family Medicine

## 2013-06-03 ENCOUNTER — Encounter: Payer: Self-pay | Admitting: Family Medicine

## 2013-06-03 ENCOUNTER — Ambulatory Visit (INDEPENDENT_AMBULATORY_CARE_PROVIDER_SITE_OTHER): Payer: 59 | Admitting: Family Medicine

## 2013-06-03 VITALS — BP 138/80 | HR 74 | Temp 98.6°F | Ht 71.0 in | Wt 242.5 lb

## 2013-06-03 DIAGNOSIS — M25569 Pain in unspecified knee: Secondary | ICD-10-CM

## 2013-06-03 DIAGNOSIS — M25562 Pain in left knee: Secondary | ICD-10-CM

## 2013-06-03 DIAGNOSIS — Z23 Encounter for immunization: Secondary | ICD-10-CM

## 2013-06-03 NOTE — Progress Notes (Signed)
Date:  06/03/2013   Name:  Mitchell Palmer   DOB:  14-Oct-1965   MRN:  [id number] Gender: male Age: 47 y.o.  Primary Physician:  Roxy Manns, MD   Chief Complaint: Knee Pain   History of Present Illness:  Mitchell Palmer is a 47 y.o. pleasant patient who presents with the following:  Left knee, changed something on his truck and was on the ground. Hurts to squate down to get on toilet.  Lateral knee pain. He is now been having a fairly significant effusion. Most of his pain is lateral. He has pain with deep flexion. No locking up of the joint and no symptomatic giving way. No prior knee operations or fractures in the area.  Patient Active Problem List   Diagnosis Date Noted  . Viral pharyngitis 05/29/2013  . ETD (eustachian tube dysfunction) 08/22/2012  . Low back pain 06/17/2012  . Abnormal chest x-ray 10/15/2011  . Right shoulder pain 10/12/2011  . PLANTAR FASCIITIS, TRAUMATIC 11/09/2009  . NEOPLASM UNCERTAIN BEHAVIOR OTHER SPEC SITES 10/07/2009  . ESOPHAGEAL STRICTURE 06/27/2009  . GERD 03/31/2009  . OTHER DYSPHAGIA 03/31/2009  . DIABETES MELLITUS, TYPE II 01/06/2009  . HYPERLIPIDEMIA 01/06/2009  . HYPERTENSION 01/06/2009  . ALLERGIC RHINITIS 01/06/2009    Past Medical History  Diagnosis Date  . Allergy     allegic rhinitis  . Diabetes mellitus     type II  . Hyperlipidemia   . Hypertension   . GERD (gastroesophageal reflux disease)   . Hyperplastic polyp of intestine 2010  . Esophageal stricture   . History of hernia repair     as a baby  . S/P ear surgery, follow-up exam     for drainage    Past Surgical History  Procedure Laterality Date  . Esophagogastroduodenoscopy  04/2009    stricture/GERD  . Hernia repair      age 66-midline  . Inner ear surgery  1988    went in behind rt ear-blockage  . Shoulder arthroscopy with rotator cuff repair and subacromial decompression Right 09/29/2012    Procedure: SHOULDER ARTHROSCOPY WITH ROTATOR CUFF REPAIR AND  SUBACROMIAL DECOMPRESSION;  Surgeon: Mable Paris, MD;  Location:  [facility];  Service: Orthopedics;  Laterality: Right;  Right shoulder arthroscopy with subacromial decompression and distal clavicle excision & Debridement of Labrial Tear    History   Social History  . Marital Status: Single    Spouse Name: N/A    Number of Children: N/A  . Years of Education: N/A   Occupational History  . Not on file.   Social History Main Topics  . Smoking status: Never Smoker   . Smokeless tobacco: Never Used  . Alcohol Use: Yes     Comment: 6 pk every 3 days; 12 pk/week  . Drug Use: No  . Sexual Activity: Not on file   Other Topics Concern  . Not on file   Social History Narrative  . No narrative on file    Family History  Problem Relation Age of Onset  . Diabetes Mother   . Cancer Father 38    colon cancer  . Diabetes Maternal Aunt   . Cancer Cousin     breast cancer  . Diabetes Other     Allergies  Allergen Reactions  . Codeine     REACTION: anxious    Medication list has been reviewed and updated.  Outpatient Prescriptions Prior to Visit  Medication Sig Dispense Refill  . atorvastatin (LIPITOR) 40 MG  tablet Take 1 tablet (40 mg total) by mouth daily.  30 tablet  11  . B-D UF III MINI PEN NEEDLES 31G X 5 MM MISC       . Bromocriptine Mesylate 0.8 MG TABS Take 2 tablets (1.6 mg total) by mouth daily before breakfast.  60 tablet  2  . Canagliflozin 300 MG TABS Take 1 tablet (300 mg total) by mouth daily before breakfast.  30 tablet  1  . cyclobenzaprine (FLEXERIL) 10 MG tablet Take 1 tablet (10 mg total) by mouth 3 (three) times daily as needed for muscle spasms. For low back pain  30 tablet  0  . fluticasone (FLONASE) 50 MCG/ACT nasal spray USE 2 SPRAYS IN EACH NOSTRIL ONCE A DAY  16 g  5  . glucose blood (ONE TOUCH ULTRA TEST) test strip TEST BLOOD SUGAR 2 TIMES A DAY AS DIRECTED  100 each  11  . HYDROcodone-acetaminophen (NORCO/VICODIN) 5-325  MG per tablet       . insulin glargine (LANTUS) 100 UNIT/ML injection Inject 0.3 mLs (30 Units total) into the skin 2 (two) times daily.  30 mL  3  . Insulin Syringe-Needle U-100 (B-D INS SYR ULTRAFINE 1CC/31G) 31G X 5/16" 1 ML MISC Inject 35 units into the skin every 12 hours as directed by physician.  200 each  2  . metFORMIN (GLUCOPHAGE) 1000 MG tablet TAKE 1 TABLET (1,000 MG TOTAL) BY MOUTH 2 (TWO) TIMES DAILY WITH A MEAL.  60 tablet  2  . naproxen (NAPROSYN) 500 MG tablet Take 500 mg by mouth 2 (two) times daily with a meal.      . olmesartan (BENICAR) 40 MG tablet Take 1 tablet (40 mg total) by mouth daily.  30 tablet  11  . omeprazole (PRILOSEC) 40 MG capsule Take 1 capsule (40 mg total) by mouth daily. Every morning 30 minutes before breakfast meal.  30 capsule  5  . oxyCODONE-acetaminophen (PERCOCET) 5-325 MG per tablet Take 1-2 tablets by mouth every 4 (four) hours as needed for pain.      . tadalafil (CIALIS) 20 MG tablet Take 1 tablet (20 mg total) by mouth as directed.  10 tablet  11  . vitamin B-12 (CYANOCOBALAMIN) 1000 MCG tablet Take 1,000 mcg by mouth daily.         No facility-administered medications prior to visit.    Review of Systems:   GEN: No fevers, chills. Nontoxic. Primarily MSK c/o today. MSK: Detailed in the HPI GI: tolerating PO intake without difficulty Neuro: No numbness, parasthesias, or tingling associated. Otherwise the pertinent positives of the ROS are noted above.    Physical Examination: BP 138/80  Pulse 74  Temp(Src) 98.6 F (37 C) (Oral)  Ht 5\' 11"  (1.803 m)  Wt 242 lb 8 oz (109.997 kg)  BMI 33.84 kg/m2  Ideal Body Weight: Weight in (lb) to have BMI = 25: 178.9   GEN: WDWN, NAD, Non-toxic, Alert & Oriented x 3 HEENT: Atraumatic, Normocephalic.  Ears and Nose: No external deformity. EXTR: No clubbing/cyanosis/edema NEURO: Normal gait.  PSYCH: Normally interactive. Conversant. Not depressed or anxious appearing.  Calm demeanor.   Knee:   L Gait: Normal heel toe pattern ROM: 0-120 Effusion: mild-mod Echymosis or edema: none Patellar tendon NT Painful PLICA: neg Patellar grind: negative Medial and lateral patellar facet loading: negative medial and lateral joint lines: ttp more laterally Mcmurray's neg Flexion-pinch pos Varus and valgus stress: stable Lachman: neg Ant and Post drawer: neg Hip abduction, IR,  ER: WNL Hip flexion str: 5/5 Hip abd: 5/5 Quad: 5/5 VMO atrophy:No Hamstring concentric and eccentric: 5/5   Dg Knee Ap/lat W/sunrise Left  06/03/2013   CLINICAL DATA:  Pain and swelling  EXAM: DG KNEE - 3 VIEWS  COMPARISON:  None.  FINDINGS: Standing frontal, standing lateral, and sunrise patellar images were obtained. There is no fracture or dislocation. There is a moderate joint effusion. Joint spaces appear intact. No erosive change.  IMPRESSION: Moderate joint effusion. No appreciable joint space narrowing. No fracture or dislocation.   Electronically Signed   By: Bretta Bang M.D.   On: 06/03/2013 14:37    Assessment and Plan:  Knee pain, left - Plan: DG Knee AP/LAT W/Sunrise Left  Joint spaces look well preserved. Hopefully more meniscal contusion and bone bruise. Cont NSAIDS, ice, aspirate knee and follow clinically.  Knee Aspiration and Injection, L Patient verbally consented; risks, benefits, and alternatives explained including possible infection. Patient prepped with Chloraprep. Ethyl chloride for anesthesia. 10 cc of 1% Lidocaine used in wheal then injected Subcutaneous fashion with 22 gauge needle on lateral approach. Under sterilne conditions, 18 gauge needle used via lateral approach to aspirate 20 cc of serosanguinous fluid. Then 8 cc of Lidocaine 1% and 2 cc of Depo-Medrol 40 mg injected. Tolerated well, decreased pain, no complications.   Orders Today:  Orders Placed This Encounter  Procedures  . DG Knee AP/LAT W/Sunrise Left    Standing Status: Future     Number of Occurrences: 1      Standing Expiration Date: 08/03/2014    Order Specific Question:  Reason for exam:    Answer:  knee pain, swelling    Order Specific Question:  Preferred imaging location?    Answer:  [facility] [place]  . Flu Vaccine QUAD 36+ mos IM    Updated Medication List: (Includes new medications, updates to list, dose adjustments) No orders of the defined types were placed in this encounter.    Medications Discontinued: There are no discontinued medications.    Signed,  Elpidio Galea. Trew Sunde, MD, CAQ Sports Medicine  [facility] [address] [place] [address] Phone: [phone] Fax: [phone]

## 2013-06-26 ENCOUNTER — Ambulatory Visit (INDEPENDENT_AMBULATORY_CARE_PROVIDER_SITE_OTHER): Payer: 59 | Admitting: Internal Medicine

## 2013-06-26 ENCOUNTER — Encounter: Payer: Self-pay | Admitting: Internal Medicine

## 2013-06-26 VITALS — BP 118/80 | HR 98 | Temp 98.1°F | Resp 10 | Wt 238.0 lb

## 2013-06-26 DIAGNOSIS — E119 Type 2 diabetes mellitus without complications: Secondary | ICD-10-CM

## 2013-06-26 MED ORDER — LIRAGLUTIDE 18 MG/3ML ~~LOC~~ SOPN: 1.8000 mg | PEN_INJECTOR | Freq: Every day | SUBCUTANEOUS | Status: DC

## 2013-06-26 NOTE — Patient Instructions (Signed)
Please start Victoza at 0.6 mg daily and advance to 1.2 mg in 5 days and then in 5 more days to 1.8 mg. Please stay on the current regimen, otherwise. Please return in 3 months with your sugar log.

## 2013-06-26 NOTE — Progress Notes (Signed)
Subjective:     Patient ID: Mitchell Palmer, male   DOB: October 22, 1965, 47 y.o.   MRN: [id number]  Diabetes  Mitchell Palmer is a 47 year old man, returning for followup for DM 2, dx in 2000, uncontrolled, insulin-dependent, with complications (CKD stage 3, erectile dysfunction). Last visit with me was 1 month ago.  Since last visit, he got a steroid injection in left knee ~3 weeks ago. He has had these in the past  His last hemoglobin A1c was: Lab Results  Component Value Date   HGBA1C 9.5* 05/26/2013   HGBA1C 9.9* 01/28/2013   HGBA1C 10.0* 10/14/2012   He is now on a regimen of: - Metformin 1000 mg twice a day (also takes B12 vitamin po daily) - Lantus 30 bid - Invokana 300 << 100 - Cycloset - started 04/22/2013 >> at 1.6 mg daily. At previous visits, we stopped Actos 45 mg daily.   He was on Januvia in the past, and also on glyburide/metformin, Avandia. Stopped Bydureon 2 mg weekly when ran out of Rx. Was on Victoza, also.  He is checking his sugars 2-3 a day (he has a One Touch ultra meter): Reviewed last visit CBG log. Sugars are now very similar: - am: 150-190s >> 137-194 >> 138-183 >> 94-196 - 2 hours after breakfast: 200s >> 155-286 >> 183-220 >> 91-231 - before lunch:  150s >> 132, one value >> 83-150 >> 83, 94 - 2 hours after lunch: 167, one value >> 240, one value >> 129, 216 - Before dinner: 150-180s >> 87-225 >> 135-160 >> 98-161 - 2 hours after dinner: 200s >> 200-282 >> 220-250 >> 191-294 - bedtime 280-312 >> 200s and had 1x 300 >> up to 300 (pizza)  At last visit, he admitted that some of the sugars were checked after snacking/alcohol.  Lowest: 83 x 1, at lunchtime. He has hypoglycemia awareness ~80.   - Has HL (increased TG). Last lipid panel: Lab Results  Component Value Date   CHOL 134 01/28/2013   HDL 48.30 01/28/2013   LDLCALC 67 07/08/2009   LDLDIRECT 65.7 01/28/2013   TRIG 210.0* 01/28/2013   CHOLHDL 3 01/28/2013  He is on Atorvastatin. - last eye exam was with  Dr. Dione Booze in 02/17/2013, mild retinopathy, L>R  - No numbness or tingling in his legs.  - He does have chronic kidney disease - stage 2, last BUN/Cr:  Lab Results  Component Value Date   BUN 15 05/26/2013   CREATININE 1.1 05/26/2013  He is on Olmestartan.  He works nights: 6 pm - 7:30 am. Works 3 days a week.   The patient also has a history of hyperlipidemia, hypertension, GERD, esophageal stricture, plantar fasciitis, eustachian tube dysfunction, status post ear surgery, history of increased alcohol use.   Review of Systems Constitutional: + weight loss, no fatigue;+ nocturia Eyes: no blurry vision, no xerophthalmia ENT: + sore throat, no nodules palpated in throat, no dysphagia/odynophagia, no hoarseness Cardiovascular: no CP/SOB/palpitations/leg swelling Respiratory: no cough/SOB Gastrointestinal: no N/V/D/C Musculoskeletal: no muscle/joint aches Skin: no rashes + diffic. with erections  I reviewed pt's medications, allergies, PMH, social hx, family hx and no changes required except as above.   Objective:   Physical Exam BP 118/80  Pulse 98  Temp(Src) 98.1 F (36.7 C) (Oral)  Resp 10  Wt 238 lb (107.956 kg)  SpO2 96%   Wt Readings from Last 3 Encounters:  06/03/13 242 lb 8 oz (109.997 kg)  05/29/13 241 lb 8 oz (109.544 kg)  05/26/13 242  lb (109.77 kg)  Constitutional: obese, in NAD Eyes: PERRLA, EOMI, no exophthalmos ENT: moist mucous membranes, no thyromegaly, no cervical lymphadenopathy Cardiovascular: RRR, No MRG Respiratory: CTA B Gastrointestinal: abdomen soft, NT, ND, BS+ Musculoskeletal: no deformities, strength intact in all 4 Skin: moist, warm, no rashes  Assessment:     1. 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 He also has an erratic circadian rhythm,  working nights 3 out of 5 days a week. He is also telling me that he has been drinking more moonshine recently and he believes that the sugar in it can increase his after dinner sugars. The increase in Invokana did not have much of an effect on his blood sugars, and his sugars are still in the 200s after meals. I believe that the Cycloset is also not very efficient in him as he has a variable circadian rhythm, working 3/7 nights. We discussed about possible routes of treatment to improve his after meal sugars, and for now I suggested to try Victoza again, and if this does not work, at next visit we'll start mealtime insulin. I did discuss about the VGo system with him, but he is not attracted by this. I advised him to: - Continue Invokana, at 300 mg daily, since his dizziness resolved. He stays well hydrated. Advised to take this before breakfast.  - continue Lantus to 30 units every 12 hours - refilled (vials) - continue metformin at target dose - continue Cycloset 1.6 mg daily for now  - add Victoza and advance to 1.8 mg daily - given Victoza samples, and I will also call in a 2 months worth of medication - had flu vaccine 3 weeks ago - I will see the patient back in 3 months with his CBG log

## 2013-07-09 ENCOUNTER — Other Ambulatory Visit: Payer: Self-pay | Admitting: Internal Medicine

## 2013-07-19 ENCOUNTER — Other Ambulatory Visit: Payer: Self-pay | Admitting: Internal Medicine

## 2013-07-21 ENCOUNTER — Other Ambulatory Visit: Payer: Self-pay | Admitting: Internal Medicine

## 2013-08-18 ENCOUNTER — Other Ambulatory Visit: Payer: Self-pay | Admitting: Family Medicine

## 2013-08-19 ENCOUNTER — Other Ambulatory Visit: Payer: Self-pay

## 2013-08-19 ENCOUNTER — Other Ambulatory Visit: Payer: Self-pay | Admitting: *Deleted

## 2013-08-19 MED ORDER — LIRAGLUTIDE 18 MG/3ML ~~LOC~~ SOPN: 1.8000 mg | PEN_INJECTOR | Freq: Every day | SUBCUTANEOUS | Status: DC

## 2013-08-23 MED ORDER — "INSULIN SYRINGE-NEEDLE U-100 31G X 5/16"" 1 ML MISC": Status: DC

## 2013-09-14 ENCOUNTER — Ambulatory Visit (INDEPENDENT_AMBULATORY_CARE_PROVIDER_SITE_OTHER): Payer: 59 | Admitting: Family Medicine

## 2013-09-14 VITALS — BP 124/86 | HR 85 | Temp 98.4°F | Ht 71.0 in | Wt 236.5 lb

## 2013-09-14 DIAGNOSIS — M25569 Pain in unspecified knee: Secondary | ICD-10-CM

## 2013-09-14 DIAGNOSIS — M25561 Pain in right knee: Secondary | ICD-10-CM

## 2013-09-14 DIAGNOSIS — M25562 Pain in left knee: Principal | ICD-10-CM

## 2013-09-14 NOTE — Progress Notes (Signed)
Pre-visit discussion using our clinic review tool. No additional management support is needed unless otherwise documented below in the visit note.  

## 2013-09-14 NOTE — Progress Notes (Signed)
Date:  09/14/2013   Name:  Mitchell Palmer   DOB:  05/10/1966   MRN:  [id number] Gender: male Age: 48 y.o.  Primary Physician:  Loura Pardon, MD   Chief Complaint: Knee Pain   Subjective:   History of Present Illness:  Mitchell Palmer is a 48 y.o. pleasant patient who presents with the following:  Pleasant gentleman who I saw in the fall in early October, and at that time he had a significant effusion, and I aspirated his knee and got about 20 cc of fluid, and then injected his knee with some corticosteroids. He had good relief of symptoms, but he continues to have significant crepitus, and pain now primarily in the RIGHT knee. He does work in [employer], and has pain when he goes up and down stairs and going up and down ladders. His pain is worse when he is coming down. No mech symptoms.  Naprosyn 500 mg once a day right now.  Patient Active Problem List   Diagnosis Date Noted  . Low back pain 06/17/2012  . Abnormal chest x-ray 10/15/2011  . Right shoulder pain 10/12/2011  . PLANTAR FASCIITIS, TRAUMATIC 11/09/2009  . NEOPLASM UNCERTAIN BEHAVIOR OTHER SPEC SITES 10/07/2009  . ESOPHAGEAL STRICTURE 06/27/2009  . GERD 03/31/2009  . OTHER DYSPHAGIA 03/31/2009  . DIABETES MELLITUS, TYPE II 01/06/2009  . HYPERLIPIDEMIA 01/06/2009  . HYPERTENSION 01/06/2009  . ALLERGIC RHINITIS 01/06/2009    Past Medical History  Diagnosis Date  . Allergy     allegic rhinitis  . Diabetes mellitus     type II  . Hyperlipidemia   . Hypertension   . GERD (gastroesophageal reflux disease)   . Hyperplastic polyp of intestine 2010  . Esophageal stricture   . History of hernia repair     as a baby  . S/P ear surgery, follow-up exam     for drainage    Past Surgical History  Procedure Laterality Date  . Esophagogastroduodenoscopy  04/2009    stricture/GERD  . Hernia repair      age 40-midline  . Inner ear surgery  1988    went in behind rt ear-blockage  . Shoulder arthroscopy with  rotator cuff repair and subacromial decompression Right 09/29/2012    Procedure: SHOULDER ARTHROSCOPY WITH ROTATOR CUFF REPAIR AND SUBACROMIAL DECOMPRESSION;  Surgeon: Nita Sells, MD;  Location: [facility];  Service: Orthopedics;  Laterality: Right;  Right shoulder arthroscopy with subacromial decompression and distal clavicle excision & Debridement of Labrial Tear    History   Social History  . Marital Status: Single    Spouse Name: N/A    Number of Children: N/A  . Years of Education: N/A   Occupational History  . Not on file.   Social History Main Topics  . Smoking status: Never Smoker   . Smokeless tobacco: Never Used  . Alcohol Use: Yes     Comment: 6 pk every 3 days; 12 pk/week  . Drug Use: No  . Sexual Activity: Not on file   Other Topics Concern  . Not on file   Social History Narrative  . No narrative on file    Family History  Problem Relation Age of Onset  . Diabetes Mother   . Cancer Father 6    colon cancer  . Diabetes Maternal Aunt   . Cancer Cousin     breast cancer  . Diabetes Other     Allergies  Allergen Reactions  . Codeine  REACTION: anxious    Medication list has been reviewed and updated.  Review of Systems:  GEN: No fevers, chills. Nontoxic. Primarily MSK c/o today. MSK: Detailed in the HPI GI: tolerating PO intake without difficulty Neuro: No numbness, parasthesias, or tingling associated. Otherwise the pertinent positives of the ROS are noted above.   Objective:   Physical Examination: BP 124/86  Pulse 85  Temp(Src) 98.4 F (36.9 C) (Oral)  Ht 5\' 11"  (1.803 m)  Wt 236 lb 8 oz (107.276 kg)  BMI 33.00 kg/m2  SpO2 96%  Ideal Body Weight: Weight in (lb) to have BMI = 25: 178.9   GEN: WDWN, NAD, Non-toxic, Alert & Oriented x 3 HEENT: Atraumatic, Normocephalic.  Ears and Nose: No external deformity. EXTR: No clubbing/cyanosis/edema NEURO: Normal gait.  PSYCH: Normally interactive.  Conversant. Not depressed or anxious appearing.  Calm demeanor.   Knee:  B Gait: Normal heel toe pattern ROM: 0-125 b Effusion: neg Echymosis or edema: none Patellar tendon NT Painful PLICA: neg Patellar grind: POSITIVE Medial and lateral patellar facet loading: R > L pain medial and lateral joint lines: mild medial pain Mcmurray's neg Flexion-pinch neg Varus and valgus stress: stable Lachman: neg Ant and Post drawer: neg Hip abduction, IR, ER: WNL Hip flexion str: 5/5 Hip abd: 5/5 Quad: 5/5 VMO atrophy:No Hamstring concentric and eccentric: 5/5   CLINICAL DATA:  Pain and swelling   EXAM: DG KNEE - 3 VIEWS   COMPARISON:  None.   FINDINGS: Standing frontal, standing lateral, and sunrise patellar images were obtained. There is no fracture or dislocation. There is a moderate joint effusion. Joint spaces appear intact. No erosive change.   IMPRESSION: Moderate joint effusion. No appreciable joint space narrowing. No fracture or dislocation.     Electronically Signed   By: Lowella Grip M.D.   On: 06/03/2013 14:37   Assessment & Plan:    Bilateral knee pain  Increase naprosyn to bid, add tylenol Tylenol Extra Strength 500 mg: 2 tablets up to 4 times a day   Likely pf > medial oa with some exacerbation  BS will go up with inj - he states he knows how to adjust his insulin and will check sugar levels  Knee Injection, R Patient verbally consented to procedure. Risks (including potential rare risk of infection), benefits, and alternatives explained. Sterilely prepped with Chloraprep. Ethyl cholride used for anesthesia. 8 cc Lidocaine 1% mixed with 2 cc of Depo-Medrol 40 mg injected using the anteromedial approach without difficulty. No complications with procedure and tolerated well. Patient had decreased pain post-injection.   Knee Injection, L Patient verbally consented to procedure. Risks (including potential rare risk of infection), benefits, and  alternatives explained. Sterilely prepped with Chloraprep. Ethyl cholride used for anesthesia. 8 cc Lidocaine 1% mixed with 2 cc of Depo-Medrol 40 mg injected using the anteromedial approach without difficulty. No complications with procedure and tolerated well. Patient had decreased pain post-injection.   New medications, updates to list, dose adjustments: Meds ordered this encounter  Medications  . Liraglutide (VICTOZA) 18 MG/3ML SOPN    Sig: Inject 18 mg into the skin every morning.    Signed,  Maud Deed. Jameisha Stofko, MD, [place] [facility] [place] [address] Phone: [phone] Fax: [phone]    Medication List       This list is accurate as of: 09/14/13 11:59 PM.  Always use your most recent med list.  atorvastatin 40 MG tablet  Commonly known as:  LIPITOR  Take 1 tablet (40 mg total) by mouth daily.     B-D UF III MINI PEN NEEDLES 31G X 5 MM Misc  Generic drug:  Insulin Pen Needle     BENICAR 40 MG tablet  Generic drug:  olmesartan  TAKE 1 TABLET (40 MG TOTAL) BY MOUTH DAILY.     cyclobenzaprine 10 MG tablet  Commonly known as:  FLEXERIL  Take 1 tablet (10 mg total) by mouth 3 (three) times daily as needed for muscle spasms. For low back pain     CYCLOSET 0.8 MG Tabs  Generic drug:  Bromocriptine Mesylate  TAKE 2 TABLETS (1.6 MG TOTAL) BY MOUTH DAILY BEFORE BREAKFAST.     fluticasone 50 MCG/ACT nasal spray  Commonly known as:  FLONASE  USE 2 SPRAYS IN EACH NOSTRIL ONCE A DAY     glucose blood test strip  Commonly known as:  ONE TOUCH ULTRA TEST  TEST BLOOD SUGAR 2 TIMES A DAY AS DIRECTED     insulin glargine 100 UNIT/ML injection  Commonly known as:  LANTUS  Inject 0.3 mLs (30 Units total) into the skin 2 (two) times daily.     Insulin Syringe-Needle U-100 31G X 5/16" 1 ML Misc  Commonly known as:  B-D INS SYR ULTRAFINE 1CC/31G  Inject 35 units into the skin every 12 hours as directed  by physician.     INVOKANA 300 MG Tabs  Generic drug:  Canagliflozin  TAKE 1 TABLET (300 MG TOTAL) BY MOUTH DAILY BEFORE BREAKFAST.     metFORMIN 1000 MG tablet  Commonly known as:  GLUCOPHAGE  TAKE 1 TABLET (1,000 MG TOTAL) BY MOUTH 2 (TWO) TIMES DAILY WITH A MEAL.     naproxen 500 MG tablet  Commonly known as:  NAPROSYN  Take 500 mg by mouth 2 (two) times daily with a meal.     omeprazole 40 MG capsule  Commonly known as:  PRILOSEC  Take 1 capsule (40 mg total) by mouth daily. Every morning 30 minutes before breakfast meal.     tadalafil 20 MG tablet  Commonly known as:  CIALIS  Take 1 tablet (20 mg total) by mouth as directed.     VICTOZA 18 MG/3ML Sopn  Generic drug:  Liraglutide  Inject 18 mg into the skin every morning.     vitamin B-12 1000 MCG tablet  Commonly known as:  CYANOCOBALAMIN  Take 1,000 mcg by mouth daily.

## 2013-09-15 ENCOUNTER — Encounter: Payer: Self-pay | Admitting: Family Medicine

## 2013-09-30 ENCOUNTER — Ambulatory Visit (INDEPENDENT_AMBULATORY_CARE_PROVIDER_SITE_OTHER): Payer: 59 | Admitting: Internal Medicine

## 2013-09-30 ENCOUNTER — Telehealth: Payer: Self-pay | Admitting: Family Medicine

## 2013-09-30 ENCOUNTER — Encounter: Payer: Self-pay | Admitting: Internal Medicine

## 2013-09-30 VITALS — BP 124/78 | HR 74 | Temp 98.2°F | Wt 236.8 lb

## 2013-09-30 DIAGNOSIS — Z0000 Encounter for general adult medical examination without abnormal findings: Secondary | ICD-10-CM | POA: Insufficient documentation

## 2013-09-30 DIAGNOSIS — I10 Essential (primary) hypertension: Secondary | ICD-10-CM

## 2013-09-30 DIAGNOSIS — E119 Type 2 diabetes mellitus without complications: Secondary | ICD-10-CM

## 2013-09-30 DIAGNOSIS — E785 Hyperlipidemia, unspecified: Secondary | ICD-10-CM

## 2013-09-30 LAB — COMPREHENSIVE METABOLIC PANEL
ALBUMIN: 4.3 g/dL (ref 3.5–5.2)
ALT: 31 U/L (ref 0–53)
AST: 25 U/L (ref 0–37)
Alkaline Phosphatase: 76 U/L (ref 39–117)
BUN: 16 mg/dL (ref 6–23)
CALCIUM: 9.7 mg/dL (ref 8.4–10.5)
CHLORIDE: 102 meq/L (ref 96–112)
CO2: 28 mEq/L (ref 19–32)
Creatinine, Ser: 1 mg/dL (ref 0.4–1.5)
GFR: 82.43 mL/min (ref >60.00)
GLUCOSE: 176 mg/dL — AB (ref 70–99)
Potassium: 5.1 mEq/L (ref 3.5–5.1)
Sodium: 140 mEq/L (ref 135–145)
Total Bilirubin: 0.9 mg/dL (ref 0.3–1.2)
Total Protein: 7.1 g/dL (ref 6.0–8.3)

## 2013-09-30 LAB — CBC WITH DIFFERENTIAL/PLATELET
BASOS PCT: 0.5 % (ref 0.0–3.0)
Basophils Absolute: 0 10*3/uL (ref 0.0–0.1)
EOS PCT: 3.4 % (ref 0.0–5.0)
Eosinophils Absolute: 0.3 10*3/uL (ref 0.0–0.7)
HCT: 50.7 % (ref 39.0–52.0)
Hemoglobin: 17.1 g/dL — ABNORMAL HIGH (ref 13.0–17.0)
LYMPHS PCT: 16 % (ref 12.0–46.0)
Lymphs Abs: 1.3 10*3/uL (ref 0.7–4.0)
MCHC: 33.7 g/dL (ref 30.0–36.0)
MCV: 102.8 fl — AB (ref 78.0–100.0)
MONO ABS: 0.7 10*3/uL (ref 0.1–1.0)
Monocytes Relative: 8.7 % (ref 3.0–12.0)
NEUTROS PCT: 71.4 % (ref 43.0–77.0)
Neutro Abs: 5.8 10*3/uL (ref 1.4–7.7)
Platelets: 241 10*3/uL (ref 150.0–400.0)
RBC: 4.93 Mil/uL (ref 4.22–5.81)
RDW: 12.5 % (ref 11.5–14.6)
WBC: 8.2 10*3/uL (ref 4.5–10.5)

## 2013-09-30 LAB — HEMOGLOBIN A1C: Hgb A1c MFr Bld: 8.9 % — ABNORMAL HIGH (ref 4.6–6.5)

## 2013-09-30 LAB — TSH: TSH: 2.18 u[IU]/mL (ref 0.35–5.50)

## 2013-09-30 LAB — LIPID PANEL
CHOLESTEROL: 144 mg/dL (ref 0–200)
HDL: 49.2 mg/dL (ref >39.00)
LDL Cholesterol: 62 mg/dL (ref 0–99)
TRIGLYCERIDES: 162 mg/dL — AB (ref 0.0–149.0)
Total CHOL/HDL Ratio: 3
VLDL: 32.4 mg/dL (ref 0.0–40.0)

## 2013-09-30 MED ORDER — BROMOCRIPTINE MESYLATE 0.8 MG PO TABS: ORAL_TABLET | ORAL | Status: DC

## 2013-09-30 MED ORDER — GLUCOSE BLOOD VI STRP: ORAL_STRIP | Status: DC

## 2013-09-30 MED ORDER — INSULIN GLARGINE 100 UNIT/ML ~~LOC~~ SOLN: 30.0000 [IU] | Freq: Two times a day (BID) | SUBCUTANEOUS | Status: DC

## 2013-09-30 NOTE — Progress Notes (Signed)
Pre-visit discussion using our clinic review tool. No additional management support is needed unless otherwise documented below in the visit note.  

## 2013-09-30 NOTE — Telephone Encounter (Signed)
Lab orders for PE to be scheduled

## 2013-09-30 NOTE — Progress Notes (Signed)
Subjective:     Patient ID: Mitchell Palmer, male   DOB: 05/27/1966, 48 y.o.   MRN: [id number]  Diabetes  Mitchell Palmer is a 48 year old man, returning for followup for DM 2, dx in 2000, uncontrolled, insulin-dependent, with complications (CKD stage 3, erectile dysfunction). Last visit with me was 3 month ago.  His last hemoglobin A1c was: Lab Results  Component Value Date   HGBA1C 9.5* 05/26/2013   HGBA1C 9.9* 01/28/2013   HGBA1C 10.0* 10/14/2012   He had 3 steroid injections in 01/16 and 26/2015 >> sugars higher now.  He is now on a regimen of: - Metformin 1000 mg twice a day (also takes B12 vitamin po daily) - Lantus 30 bid - Invokana 300 << 100 - Cycloset - started 04/22/2013 >> at 1.6 mg daily. - started Victoza 1.8 mg daily At previous visits, we stopped Actos 45 mg daily.   He was on Januvia in the past, and also on glyburide/metformin, Avandia. Stopped Bydureon 2 mg weekly when ran out of Rx. Was on Victoza, also.  He is checking his sugars 2-3 a day (he has a One Touch ultra meter). Forgot log today. - am: 150-190s >> 137-194 >> 138-183 >> 94-196 >> before steroids: 112-114 (higher if pizza at night); 140-160s lately after steroids - 2 hours after breakfast: 200s >> 155-286 >> 183-220 >> 91-231 >> 180-220 - before lunch:  150s >> 132, one value >> 83-150 >> 83, 94 >> 112-140 - 2 hours after lunch: 167, one value >> 240, one value >> 129, 216 >> 175-220 - Before dinner: 150-180s >> 87-225 >> 135-160 >> 98-161 >> 140s - 2 hours after dinner: 200s >> 200-282 >> 220-250 >> 191-294 >> 180s - bedtime 280-312 >> 200s and had 1x 300 >> up to 300 (pizza) >> 180s  At last visit, he admitted that some of the sugars were checked after snacking/alcohol.  Lowest: 75 x 2, and few in the 80s. He has hypoglycemia awareness ~80.   - Has HL (increased TG). Last lipid panel: Lab Results  Component Value Date   CHOL 134 01/28/2013   HDL 48.30 01/28/2013   LDLCALC 67 07/08/2009   LDLDIRECT  65.7 01/28/2013   TRIG 210.0* 01/28/2013   CHOLHDL 3 01/28/2013  He is on Atorvastatin. - last eye exam was with Dr. Katy Fitch in 02/17/2013, mild retinopathy, L>R  - No numbness or tingling in his legs.  - He does have chronic kidney disease - stage 2, last BUN/Cr:  Lab Results  Component Value Date   BUN 15 05/26/2013   CREATININE 1.1 05/26/2013  He is on Olmestartan.  He works nights: 6 pm - 7:30 am. Works 3 days a week.   The patient also has a history of hyperlipidemia, hypertension, GERD, esophageal stricture, plantar fasciitis, eustachian tube dysfunction, status post ear surgery, history of increased alcohol use.   Review of Systems Constitutional: no weight loss/gain, no fatigue Eyes: no blurry vision, no xerophthalmia ENT: + sore throat, no nodules palpated in throat, no dysphagia/odynophagia, no hoarseness Cardiovascular: no CP/SOB/palpitations/leg swelling Respiratory: no cough/SOB Gastrointestinal: no N/V/D/C Musculoskeletal: no muscle/joint aches/+ joint swelling Skin: no rashes + diffic. with erections  I reviewed pt's medications, allergies, PMH, social hx, family hx and no changes required except as above.   Objective:   Physical Exam BP 124/78  Pulse 74  Temp(Src) 98.2 F (36.8 C) (Oral)  Wt 236 lb 12 oz (107.389 kg)  SpO2 97%   Wt Readings from Last 3 Encounters:  09/30/13 236 lb 12 oz (107.389 kg)  09/14/13 236 lb 8 oz (107.276 kg)  06/26/13 238 lb (107.956 kg)  Constitutional: obese, in NAD Eyes: PERRLA, EOMI, no exophthalmos ENT: moist mucous membranes, no thyromegaly, no cervical lymphadenopathy Cardiovascular: RRR, No MRG Respiratory: CTA B Gastrointestinal: abdomen soft, NT, ND, BS+ Musculoskeletal: no deformities, strength intact in all 4 Skin: moist, warm, no rashes  Assessment:     1. 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 He also has an erratic circadian rhythm, working nights 3 out of 5 days a week.  - had a log discussion about the need to improve his diet and exercise! I advised him to: - Continue Invokana, at 300 mg daily- continue Lantus to 30 units every 12 hours - refilled (vials) - continue metformin at target dose - continue Cycloset  >> increase to 3 tabs - continue Victoza 1.8 mg daily - had flu vaccine 3 weeks this season - I will see the patient back in 1.5 months with his CBG log  Office Visit on 09/30/2013  Component Date Value Ref Range Status  . Hemoglobin A1C 09/30/2013 8.9* 4.6 - 6.5 % Final   Glycemic Control Guidelines for People with Diabetes:Non Diabetic:  <6%Goal of Therapy: <7%Additional Action Suggested:  >8%    HbA1c a little better.

## 2013-09-30 NOTE — Patient Instructions (Signed)
Please continue the current regimen, but increase Cycloset to 3 tablets daily. Can increase to 4 tablets daily if sugars after meals are >180. Try [email or web address]. Please return in 1.5 month with your sugar log.

## 2013-10-01 ENCOUNTER — Telehealth: Payer: Self-pay | Admitting: Family Medicine

## 2013-10-01 NOTE — Telephone Encounter (Signed)
Relevant patient education assigned to patient using Emmi. ° °

## 2013-10-02 ENCOUNTER — Telehealth: Payer: Self-pay

## 2013-10-02 NOTE — Telephone Encounter (Signed)
Relevant patient education assigned to patient using Emmi. ° °

## 2013-10-05 ENCOUNTER — Ambulatory Visit (INDEPENDENT_AMBULATORY_CARE_PROVIDER_SITE_OTHER): Payer: 59 | Admitting: Family Medicine

## 2013-10-05 ENCOUNTER — Encounter: Payer: Self-pay | Admitting: Internal Medicine

## 2013-10-05 ENCOUNTER — Encounter: Payer: Self-pay | Admitting: Family Medicine

## 2013-10-05 VITALS — BP 112/80 | HR 89 | Temp 98.3°F | Ht 71.0 in | Wt 233.8 lb

## 2013-10-05 DIAGNOSIS — I10 Essential (primary) hypertension: Secondary | ICD-10-CM

## 2013-10-05 DIAGNOSIS — E785 Hyperlipidemia, unspecified: Secondary | ICD-10-CM

## 2013-10-05 MED ORDER — NAPROXEN 500 MG PO TABS: 500.0000 mg | ORAL_TABLET | Freq: Two times a day (BID) | ORAL | Status: DC

## 2013-10-05 NOTE — Assessment & Plan Note (Signed)
BP: 112/80 mmHg  bp in fair control at this time  No changes needed Disc lifstyle change with low sodium diet and exercise   Lab reviewed

## 2013-10-05 NOTE — Progress Notes (Signed)
Pre-visit discussion using our clinic review tool. No additional management support is needed unless otherwise documented below in the visit note.  

## 2013-10-05 NOTE — Progress Notes (Signed)
Subjective:    Patient ID: Mitchell Palmer, male    DOB: [birth year], 48 y.o.   MRN: [id number]  HPI Here for f/u of chronic medical problems   Sees Dr Renne Crigler- titrating his med Is a little light headed from it  Lab Results  Component Value Date   HGBA1C 8.9* 09/30/2013     bp is stable today  No cp or palpitations or headaches or edema  No side effects to medicines  BP Readings from Last 3 Encounters:  10/05/13 112/80  09/30/13 124/78  09/14/13 124/86     Wt is down almost 10 lb from the fall -eating less    Chemistry      Component Value Date/Time   NA 140 09/30/2013 1004   K 5.1 09/30/2013 1004   CL 102 09/30/2013 1004   CO2 28 09/30/2013 1004   BUN 16 09/30/2013 1004   CREATININE 1.0 09/30/2013 1004      Component Value Date/Time   CALCIUM 9.7 09/30/2013 1004   ALKPHOS 76 09/30/2013 1004   AST 25 09/30/2013 1004   ALT 31 09/30/2013 1004   BILITOT 0.9 09/30/2013 1004      Lab Results  Component Value Date   WBC 8.2 09/30/2013   HGB 17.1* 09/30/2013   HCT 50.7 09/30/2013   MCV 102.8* 09/30/2013   PLT 241.0 09/30/2013   hyperlipidemia    Lab Results  Component Value Date   CHOL 144 09/30/2013   CHOL 134 01/28/2013   CHOL 138 07/08/2009   Lab Results  Component Value Date   HDL 49.20 09/30/2013   HDL 48.30 01/28/2013   HDL 56.60 07/08/2009   Lab Results  Component Value Date   LDLCALC 62 09/30/2013   LDLCALC 67 07/08/2009   Lab Results  Component Value Date   TRIG 162.0* 09/30/2013   TRIG 210.0* 01/28/2013   TRIG 74.0 07/08/2009   Lab Results  Component Value Date   CHOLHDL 3 09/30/2013   CHOLHDL 3 01/28/2013   CHOLHDL 2 07/08/2009   Lab Results  Component Value Date   LDLDIRECT 65.7 01/28/2013  making some improvement  Triglycerides improved with better sugar control   Not a lot of exercise - does use the stairs at work   Patient Active Problem List   Diagnosis Date Noted  . Routine general medical examination at a health care facility 09/30/2013  .  Low back pain 06/17/2012  . Abnormal chest x-ray 10/15/2011  . Right shoulder pain 10/12/2011  . PLANTAR FASCIITIS, TRAUMATIC 11/09/2009  . NEOPLASM UNCERTAIN BEHAVIOR OTHER SPEC SITES 10/07/2009  . ESOPHAGEAL STRICTURE 06/27/2009  . GERD 03/31/2009  . OTHER DYSPHAGIA 03/31/2009  . DIABETES MELLITUS, TYPE II 01/06/2009  . HYPERLIPIDEMIA 01/06/2009  . HYPERTENSION 01/06/2009  . ALLERGIC RHINITIS 01/06/2009   Past Medical History  Diagnosis Date  . Allergy     allegic rhinitis  . Diabetes mellitus     type II  . Hyperlipidemia   . Hypertension   . GERD (gastroesophageal reflux disease)   . Hyperplastic polyp of intestine 2010  . Esophageal stricture   . History of hernia repair     as a baby  . S/P ear surgery, follow-up exam     for drainage   Past Surgical History  Procedure Laterality Date  . Esophagogastroduodenoscopy  04/2009    stricture/GERD  . Hernia repair      age 21-midline  . Inner ear surgery  1988    went in behind rt ear-blockage  .  Shoulder arthroscopy with rotator cuff repair and subacromial decompression Right 09/29/2012    Procedure: SHOULDER ARTHROSCOPY WITH ROTATOR CUFF REPAIR AND SUBACROMIAL DECOMPRESSION;  Surgeon: Nita Sells, MD;  Location: [facility];  Service: Orthopedics;  Laterality: Right;  Right shoulder arthroscopy with subacromial decompression and distal clavicle excision & Debridement of Labrial Tear   History  Substance Use Topics  . Smoking status: Never Smoker   . Smokeless tobacco: Never Used  . Alcohol Use: Yes     Comment: 6 pk every 3 days; 12 pk/week   Family History  Problem Relation Age of Onset  . Diabetes Mother   . Cancer Father 63    colon cancer  . Diabetes Maternal Aunt   . Cancer Cousin     breast cancer  . Diabetes Other    Allergies  Allergen Reactions  . Codeine     REACTION: anxious   Current Outpatient Prescriptions on File Prior to Visit  Medication Sig Dispense Refill  .  atorvastatin (LIPITOR) 40 MG tablet Take 1 tablet (40 mg total) by mouth daily.  30 tablet  11  . B-D UF III MINI PEN NEEDLES 31G X 5 MM MISC       . BENICAR 40 MG tablet TAKE 1 TABLET (40 MG TOTAL) BY MOUTH DAILY.  30 tablet  3  . Bromocriptine Mesylate (CYCLOSET) 0.8 MG TABS Take 3 tablets in am (by mouth)  60 tablet  2  . cyclobenzaprine (FLEXERIL) 10 MG tablet Take 1 tablet (10 mg total) by mouth 3 (three) times daily as needed for muscle spasms. For low back pain  30 tablet  0  . fluticasone (FLONASE) 50 MCG/ACT nasal spray USE 2 SPRAYS IN EACH NOSTRIL ONCE A DAY  16 g  5  . glucose blood (ONE TOUCH ULTRA TEST) test strip TEST BLOOD SUGAR 4 TIMES A DAY AS DIRECTED  200 each  11  . insulin glargine (LANTUS) 100 UNIT/ML injection Inject 0.3 mLs (30 Units total) into the skin 2 (two) times daily.  60 mL  3  . Insulin Syringe-Needle U-100 (B-D INS SYR ULTRAFINE 1CC/31G) 31G X 5/16" 1 ML MISC Inject 35 units into the skin every 12 hours as directed by physician.  200 each  2  . INVOKANA 300 MG TABS TAKE 1 TABLET (300 MG TOTAL) BY MOUTH DAILY BEFORE BREAKFAST.  30 tablet  1  . Liraglutide (VICTOZA) 18 MG/3ML SOPN Inject 18 mg into the skin every morning.      . metFORMIN (GLUCOPHAGE) 1000 MG tablet TAKE 1 TABLET (1,000 MG TOTAL) BY MOUTH 2 (TWO) TIMES DAILY WITH A MEAL.  60 tablet  3  . naproxen (NAPROSYN) 500 MG tablet Take 500 mg by mouth 2 (two) times daily with a meal.      . omeprazole (PRILOSEC) 40 MG capsule Take 1 capsule (40 mg total) by mouth daily. Every morning 30 minutes before breakfast meal.  30 capsule  5  . tadalafil (CIALIS) 20 MG tablet Take 1 tablet (20 mg total) by mouth as directed.  10 tablet  11  . vitamin B-12 (CYANOCOBALAMIN) 1000 MCG tablet Take 1,000 mcg by mouth daily.         No current facility-administered medications on file prior to visit.       Review of Systems Review of Systems  Constitutional: Negative for fever, appetite change, fatigue and unexpected  weight change.  Eyes: Negative for pain and visual disturbance.  Respiratory: Negative for cough  and shortness of breath.   Cardiovascular: Negative for cp or palpitations    Gastrointestinal: Negative for nausea, diarrhea and constipation.  Genitourinary: Negative for urgency and frequency.  Skin: Negative for pallor or rash   MSK pos for knee pain  Neurological: Negative for weakness, light-headedness, numbness and headaches.  Hematological: Negative for adenopathy. Does not bruise/bleed easily.  Psychiatric/Behavioral: Negative for dysphoric mood. The patient is not nervous/anxious.         Objective:   Physical Exam  Constitutional: He appears well-developed and well-nourished. No distress.  obese and well appearing   HENT:  Head: Normocephalic and atraumatic.  Nose: Nose normal.  Eyes: Conjunctivae and EOM are normal. Pupils are equal, round, and reactive to light. Right eye exhibits no discharge. Left eye exhibits no discharge. No scleral icterus.  Neck: Normal range of motion. Neck supple. No JVD present. Carotid bruit is not present. No thyromegaly present.  Cardiovascular: Normal rate, regular rhythm, normal heart sounds and intact distal pulses.  Exam reveals no gallop.   Pulmonary/Chest: Effort normal and breath sounds normal. No respiratory distress. He has no wheezes. He exhibits no tenderness.  Abdominal: Soft. Bowel sounds are normal. He exhibits no distension, no abdominal bruit and no mass. There is no tenderness.  Musculoskeletal: He exhibits no edema.  Lymphadenopathy:    He has no cervical adenopathy.  Neurological: He is alert. He has normal reflexes. No cranial nerve deficit. He exhibits normal muscle tone. Coordination normal.  Skin: Skin is warm and dry. No rash noted. No erythema. No pallor.  Psychiatric: He has a normal mood and affect.          Assessment & Plan:

## 2013-10-05 NOTE — Assessment & Plan Note (Signed)
Disc goals for lipids and reasons to control them Rev labs with pt Rev low sat fat diet in detail Continue lipitor Enc better diet

## 2013-10-05 NOTE — Patient Instructions (Signed)
Keep working on weight loss and better diet and exercise  Blood pressure and labs are overall stable  Take care of yourself

## 2013-10-25 ENCOUNTER — Other Ambulatory Visit: Payer: Self-pay | Admitting: Internal Medicine

## 2013-10-28 ENCOUNTER — Other Ambulatory Visit: Payer: Self-pay | Admitting: Internal Medicine

## 2013-10-28 ENCOUNTER — Other Ambulatory Visit: Payer: Self-pay | Admitting: *Deleted

## 2013-10-28 MED ORDER — BROMOCRIPTINE MESYLATE 0.8 MG PO TABS: 2.0000 | ORAL_TABLET | Freq: Every day | ORAL | Status: DC

## 2013-11-04 ENCOUNTER — Other Ambulatory Visit: Payer: Self-pay | Admitting: Internal Medicine

## 2013-11-11 ENCOUNTER — Encounter: Payer: Self-pay | Admitting: Internal Medicine

## 2013-11-11 ENCOUNTER — Ambulatory Visit (INDEPENDENT_AMBULATORY_CARE_PROVIDER_SITE_OTHER): Payer: 59 | Admitting: Internal Medicine

## 2013-11-11 VITALS — BP 122/78 | HR 73 | Temp 98.2°F | Resp 12 | Wt 238.0 lb

## 2013-11-11 DIAGNOSIS — E119 Type 2 diabetes mellitus without complications: Secondary | ICD-10-CM

## 2013-11-11 MED ORDER — INSULIN ASPART 100 UNIT/ML FLEXPEN: 8.0000 [IU] | PEN_INJECTOR | Freq: Two times a day (BID) | SUBCUTANEOUS | Status: DC

## 2013-11-11 MED ORDER — INSULIN PEN NEEDLE 31G X 5 MM MISC: Status: DC

## 2013-11-11 NOTE — Patient Instructions (Signed)
-   continue Metformin 1000 mg twice a day - continue Lantus 30 units 2x a day - continue Invokana 300 mg daily - stop Cycloset  - stop Victoza  - start NovoLog 8 units with 5:30 pm meal and 8 units with 12:30 pm meal. Inject 15 min before you start eating.   Please return in 1 month with your sugar log.

## 2013-11-11 NOTE — Progress Notes (Signed)
Subjective:     Patient ID: Mitchell Palmer, male   DOB: [birth year], 48 y.o.   MRN: [id number]  Diabetes  Mitchell Palmer is a 48 year old man, returning for followup for DM 2, dx in 2000, uncontrolled, insulin-dependent, with complications (CKD stage 3, erectile dysfunction). Last visit with me was 1.5 month ago.  His last hemoglobin A1c was: Lab Results  Component Value Date   HGBA1C 8.9* 09/30/2013   HGBA1C 9.5* 05/26/2013   HGBA1C 9.9* 01/28/2013   He had 3 steroid injections in 01/16 and 09/25/2013 >> sugars higher now.  He is now on a regimen of: - Metformin 1000 mg twice a day (also takes B12 vitamin po daily) - Lantus 30 bid - Invokana 300 << 100 - Cycloset - started 04/22/2013 >> at 1.6 mg daily (tried 3 tabs >> dizziness >> backed off to 2) - started Victoza 1.8 mg daily At previous visits, we stopped Actos 45 mg daily.   He was on Januvia in the past, and also on glyburide/metformin, Avandia. Stopped Bydureon 2 mg weekly when ran out of Rx. Was on Victoza, also.  He is checking his sugars 2-3 a day (he has a One Touch ultra meter). Brings log today >> sugars not much changed: - am: 150-190s >> 137-194 >> 138-183 >> 94-196 >> before steroids: 112-114 (higher if pizza at night); 140-160s lately after steroids  >> 118-165 (181, 199 - tater tots last night) - 2 hours after breakfast: 200s >> 155-286 >> 183-220 >> 91-231 >> 180-220 >> 178-216 - before lunch:  150s >> 132, one value >> 83-150 >> 83, 94 >> 112-140 >> 133 - 2 hours after lunch: 167, one value >> 240, one value >> 129, 216 >> 175-220 >> 123, 222, 259 - Before dinner: 150-180s >> 87-225 >> 135-160 >> 98-161 >> 140s >> 79-250, most 100s- 2 hours after dinner: 200s >> 200-282 >> 220-250 >> 191-294 >> 180s >> 196, 200 - bedtime 280-312 >> 200s and had 1x 300 >> up to 300 (pizza) >> 180s >> n/c  At last visit, he admitted that some of the sugars were checked after snacking/alcohol.  Lowest: 79 x 1 - skipped lunch. He has  hypoglycemia awareness ~80. Highest 250.   Meals: - 5:30 pm (at work): 2 bread slices + meat - snack at 9-10 pm: pistachios or 1/2 sandwich - 4:30 am: coffee - 11 am-12 pm: salad, meat + veggies + rice (leftovers from take out)  - Has HL (increased TG). Last lipid panel: Lab Results  Component Value Date   CHOL 144 09/30/2013   HDL 49.20 09/30/2013   LDLCALC 62 09/30/2013   LDLDIRECT 65.7 01/28/2013   TRIG 162.0* 09/30/2013   CHOLHDL 3 09/30/2013  He is on Atorvastatin. - last eye exam was with Dr. Katy Fitch in 02/17/2013, mild retinopathy, L>R  - No numbness or tingling in his legs.  - He does have chronic kidney disease - stage 2, last BUN/Cr:  Lab Results  Component Value Date   BUN 16 09/30/2013   CREATININE 1.0 09/30/2013  He is on Olmestartan.  He works nights: 6 pm - 7:30 am. Works 3 days a week.   The patient also has a history of hyperlipidemia, hypertension, GERD, esophageal stricture, plantar fasciitis, eustachian tube dysfunction, status post ear surgery, history of increased alcohol use.   Review of Systems Constitutional: + weight gain, no fatigue Eyes: no blurry vision, no xerophthalmia ENT: no sore throat, no nodules palpated in throat, no dysphagia/odynophagia, no  hoarseness Cardiovascular: no CP/SOB/palpitations/leg swelling Respiratory: no cough/SOB Gastrointestinal: no N/V/D/C Musculoskeletal: no muscle/joint aches/joint swelling Skin: no rashes + diffic. with erections  I reviewed pt's medications, allergies, PMH, social hx, family hx and no changes required except as above.   Objective:   Physical Exam BP 122/78  Pulse 73  Temp(Src) 98.2 F (36.8 C) (Oral)  Resp 12  Wt 238 lb (107.956 kg)  SpO2 97%   Wt Readings from Last 3 Encounters:  11/11/13 238 lb (107.956 kg)  10/05/13 233 lb 12 oz (106.028 kg)  09/30/13 236 lb 12 oz (107.389 kg)  Constitutional: obese, in NAD Eyes: PERRLA, EOMI, no exophthalmos ENT: moist mucous membranes, no thyromegaly,  no cervical lymphadenopathy Cardiovascular: RRR, No MRG Respiratory: CTA B Gastrointestinal: abdomen soft, NT, ND, BS+ Musculoskeletal: no deformities, strength intact in all 4 Skin: moist, warm, no rashes  Assessment:     1. 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 He also has an erratic circadian rhythm, working nights 3 out of 5 days a week. Sugars not changed despite an intense regimen. I advised him to: Patient Instructions  - continue Metformin 1000 mg twice a day - continue Lantus 30 units 2x a day - continue Invokana 300 mg daily - stop Cycloset  - stop Victoza  - start NovoLog 8 units with 5:30 pm meal and 8 units with 12:30 pm meal. Inject 15 min before you start eating.   Please return in 1.5 month with your sugar log.

## 2013-11-30 ENCOUNTER — Other Ambulatory Visit: Payer: Self-pay | Admitting: Internal Medicine

## 2013-12-09 ENCOUNTER — Ambulatory Visit: Payer: 59 | Admitting: Internal Medicine

## 2013-12-22 ENCOUNTER — Other Ambulatory Visit: Payer: Self-pay | Admitting: Family Medicine

## 2013-12-22 MED ORDER — OLMESARTAN MEDOXOMIL 40 MG PO TABS: ORAL_TABLET | ORAL | Status: DC

## 2013-12-25 ENCOUNTER — Ambulatory Visit (INDEPENDENT_AMBULATORY_CARE_PROVIDER_SITE_OTHER): Payer: 59 | Admitting: Internal Medicine

## 2013-12-25 ENCOUNTER — Ambulatory Visit: Payer: 59 | Admitting: Family Medicine

## 2013-12-25 ENCOUNTER — Encounter: Payer: Self-pay | Admitting: Internal Medicine

## 2013-12-25 VITALS — BP 126/68 | HR 87 | Temp 98.2°F | Ht 69.0 in | Wt 237.5 lb

## 2013-12-25 DIAGNOSIS — M7989 Other specified soft tissue disorders: Secondary | ICD-10-CM

## 2013-12-25 DIAGNOSIS — L03115 Cellulitis of right lower limb: Secondary | ICD-10-CM

## 2013-12-25 DIAGNOSIS — M79661 Pain in right lower leg: Secondary | ICD-10-CM

## 2013-12-25 DIAGNOSIS — L03119 Cellulitis of unspecified part of limb: Secondary | ICD-10-CM

## 2013-12-25 DIAGNOSIS — L02419 Cutaneous abscess of limb, unspecified: Secondary | ICD-10-CM

## 2013-12-25 DIAGNOSIS — M79609 Pain in unspecified limb: Secondary | ICD-10-CM

## 2013-12-25 MED ORDER — AMOXICILLIN-POT CLAVULANATE 875-125 MG PO TABS: 1.0000 | ORAL_TABLET | Freq: Two times a day (BID) | ORAL | Status: DC

## 2013-12-25 NOTE — Progress Notes (Signed)
Pre visit review using our clinic review tool, if applicable. No additional management support is needed unless otherwise documented below in the visit note. 

## 2013-12-25 NOTE — Progress Notes (Signed)
Subjective:    Patient ID: Mitchell Palmer, male    DOB: 03/23/1966, 48 y.o.   MRN: [id number]  HPI  Pt presents to the clinic today with c/o right lower leg pain, redness and swelling. He noticed this Wednesday morning. He has not had any injury to his left that he is aware of. He denies fever, chills or body aches. He does have a history of DM 2.  Review of Systems   Past Medical History  Diagnosis Date  . Allergy     allegic rhinitis  . Diabetes mellitus     type II  . Hyperlipidemia   . Hypertension   . GERD (gastroesophageal reflux disease)   . Hyperplastic polyp of intestine 2010  . Esophageal stricture   . History of hernia repair     as a baby  . S/P ear surgery, follow-up exam     for drainage    Current Outpatient Prescriptions  Medication Sig Dispense Refill  . atorvastatin (LIPITOR) 40 MG tablet Take 1 tablet (40 mg total) by mouth daily.  30 tablet  11  . cyclobenzaprine (FLEXERIL) 10 MG tablet Take 1 tablet (10 mg total) by mouth 3 (three) times daily as needed for muscle spasms. For low back pain  30 tablet  0  . fluticasone (FLONASE) 50 MCG/ACT nasal spray USE 2 SPRAYS IN EACH NOSTRIL ONCE A DAY  16 g  3  . glucose blood (ONE TOUCH ULTRA TEST) test strip TEST BLOOD SUGAR 4 TIMES A DAY AS DIRECTED  200 each  11  . insulin aspart (NOVOLOG) 100 UNIT/ML FlexPen Inject 8 Units into the skin 2 (two) times daily before a meal.  15 mL  11  . insulin glargine (LANTUS) 100 UNIT/ML injection Inject 0.3 mLs (30 Units total) into the skin 2 (two) times daily.  60 mL  3  . Insulin Pen Needle (B-D UF III MINI PEN NEEDLES) 31G X 5 MM MISC Use 4x a day  300 each  11  . Insulin Syringe-Needle U-100 (B-D INS SYR ULTRAFINE 1CC/31G) 31G X 5/16" 1 ML MISC Inject 35 units into the skin every 12 hours as directed by physician.  200 each  2  . INVOKANA 300 MG TABS TAKE 1 TABLET (300 MG TOTAL) BY MOUTH DAILY BEFORE BREAKFAST.  30 tablet  2  . metFORMIN (GLUCOPHAGE) 1000 MG tablet TAKE 1  TABLET (1,000 MG TOTAL) BY MOUTH 2 (TWO) TIMES DAILY WITH A MEAL.  60 tablet  3  . naproxen (NAPROSYN) 500 MG tablet Take 1 tablet (500 mg total) by mouth 2 (two) times daily with a meal.  60 tablet  5  . olmesartan (BENICAR) 40 MG tablet TAKE 1 TABLET (40 MG TOTAL) BY MOUTH DAILY.  30 tablet  5  . omeprazole (PRILOSEC) 40 MG capsule Take 1 capsule (40 mg total) by mouth daily. Every morning 30 minutes before breakfast meal.  30 capsule  5  . tadalafil (CIALIS) 20 MG tablet Take 1 tablet (20 mg total) by mouth as directed.  10 tablet  11  . VICTOZA 18 MG/3ML SOPN       . vitamin B-12 (CYANOCOBALAMIN) 1000 MCG tablet Take 1,000 mcg by mouth daily.         No current facility-administered medications for this visit.    Allergies  Allergen Reactions  . Codeine     REACTION: anxious    Family History  Problem Relation Age of Onset  . Diabetes Mother   .  Cancer Father 33    colon cancer  . Diabetes Maternal Aunt   . Cancer Cousin     breast cancer  . Diabetes Other     History   Social History  . Marital Status: Single    Spouse Name: N/A    Number of Children: N/A  . Years of Education: N/A   Occupational History  . Not on file.   Social History Main Topics  . Smoking status: Never Smoker   . Smokeless tobacco: Never Used  . Alcohol Use: Yes     Comment: 6 pk every 3 days; 12 pk/week  . Drug Use: No  . Sexual Activity: Not on file   Other Topics Concern  . Not on file   Social History Narrative  . No narrative on file     Constitutional: Denies fever, malaise, fatigue, headache or abrupt weight changes.  Musculoskeletal: Pt reports right lower leg swelling. Denies decrease in range of motion, difficulty with gait, muscle pain or joint pain.  Skin: Pt reports right lower leg redness. Denies  rashes, lesions or ulcercations.    No other specific complaints in a complete review of systems (except as listed in HPI above).  Objective:   Physical Exam  BP 126/68   Pulse 87  Temp(Src) 98.2 F (36.8 C) (Oral)  Ht 5\' 9"  (1.753 m)  Wt 237 lb 8 oz (107.729 kg)  BMI 35.06 kg/m2  SpO2 97% Wt Readings from Last 3 Encounters:  12/25/13 237 lb 8 oz (107.729 kg)  11/11/13 238 lb (107.956 kg)  10/05/13 233 lb 12 oz (106.028 kg)    General: Appears his stated age, obese but  well developed, well nourished in NAD. Skin: Warm, dry and intact. Cellulitis noted on RLL. Cardiovascular: Normal rate and rhythm. S1,S2 noted.  No murmur, rubs or gallops noted. No JVD or BLE edema. No carotid bruits noted. Pulmonary/Chest: Normal effort and positive vesicular breath sounds. No respiratory distress. No wheezes, rales or ronchi noted.     BMET    Component Value Date/Time   NA 140 09/30/2013 1004   K 5.1 09/30/2013 1004   CL 102 09/30/2013 1004   CO2 28 09/30/2013 1004   GLUCOSE 176* 09/30/2013 1004   BUN 16 09/30/2013 1004   CREATININE 1.0 09/30/2013 1004   CALCIUM 9.7 09/30/2013 1004   GFRNONAA >90 09/23/2012 1200   GFRAA >90 09/23/2012 1200    Lipid Panel     Component Value Date/Time   CHOL 144 09/30/2013 1004   TRIG 162.0* 09/30/2013 1004   HDL 49.20 09/30/2013 1004   CHOLHDL 3 09/30/2013 1004   VLDL 32.4 09/30/2013 1004   LDLCALC 62 09/30/2013 1004    CBC    Component Value Date/Time   WBC 8.2 09/30/2013 1004   RBC 4.93 09/30/2013 1004   HGB 17.1* 09/30/2013 1004   HCT 50.7 09/30/2013 1004   PLT 241.0 09/30/2013 1004   MCV 102.8* 09/30/2013 1004   MCHC 33.7 09/30/2013 1004   RDW 12.5 09/30/2013 1004   LYMPHSABS 1.3 09/30/2013 1004   MONOABS 0.7 09/30/2013 1004   EOSABS 0.3 09/30/2013 1004   BASOSABS 0.0 09/30/2013 1004    Hgb A1C Lab Results  Component Value Date   HGBA1C 8.9* 09/30/2013         Assessment & Plan:   Cellulitis of RLE:  eRx for Augmentin BID x 10 days Keep leg elevated Ibuprofen for swelling/pain  RTC as needed or if symptoms persist or worsen

## 2013-12-25 NOTE — Patient Instructions (Addendum)
Cellulitis Cellulitis is an infection of the skin and the tissue beneath it. The infected area is usually red and tender. Cellulitis occurs most often in the arms and lower legs.  CAUSES  Cellulitis is caused by bacteria that enter the skin through cracks or cuts in the skin. The most common types of bacteria that cause cellulitis are Staphylococcus and Streptococcus. SYMPTOMS   Redness and warmth.  Swelling.  Tenderness or pain.  Fever. DIAGNOSIS  Your caregiver can usually determine what is wrong based on a physical exam. Blood tests may also be done. TREATMENT  Treatment usually involves taking an antibiotic medicine. HOME CARE INSTRUCTIONS   Take your antibiotics as directed. Finish them even if you start to feel better.  Keep the infected arm or leg elevated to reduce swelling.  Apply a warm cloth to the affected area up to 4 times per day to relieve pain.  Only take over-the-counter or prescription medicines for pain, discomfort, or fever as directed by your caregiver.  Keep all follow-up appointments as directed by your caregiver. SEEK MEDICAL CARE IF:   You notice red streaks coming from the infected area.  Your red area gets larger or turns dark in color.  Your bone or joint underneath the infected area becomes painful after the skin has healed.  Your infection returns in the same area or another area.  You notice a swollen bump in the infected area.  You develop new symptoms. SEEK IMMEDIATE MEDICAL CARE IF:   You have a fever.  You feel very sleepy.  You develop vomiting or diarrhea.  You have a general ill feeling (malaise) with muscle aches and pains. MAKE SURE YOU:   Understand these instructions.  Will watch your condition.  Will get help right away if you are not doing well or get worse. Document Released: 05/16/2005 Document Revised: 02/05/2012 Document Reviewed: 10/22/2011 [employer] Patient Information 2014 [employer].  

## 2014-01-23 ENCOUNTER — Other Ambulatory Visit: Payer: Self-pay | Admitting: Family Medicine

## 2014-02-06 IMAGING — CR DG CHEST 2V
2 series · 2 of 2 positions shown · non-contrast
Comparison: 10/12/2011

CLINICAL DATA: Apical lesion suggested on shoulder radiograph

CHEST - 2 VIEW

[view not recorded (1 of 2)]
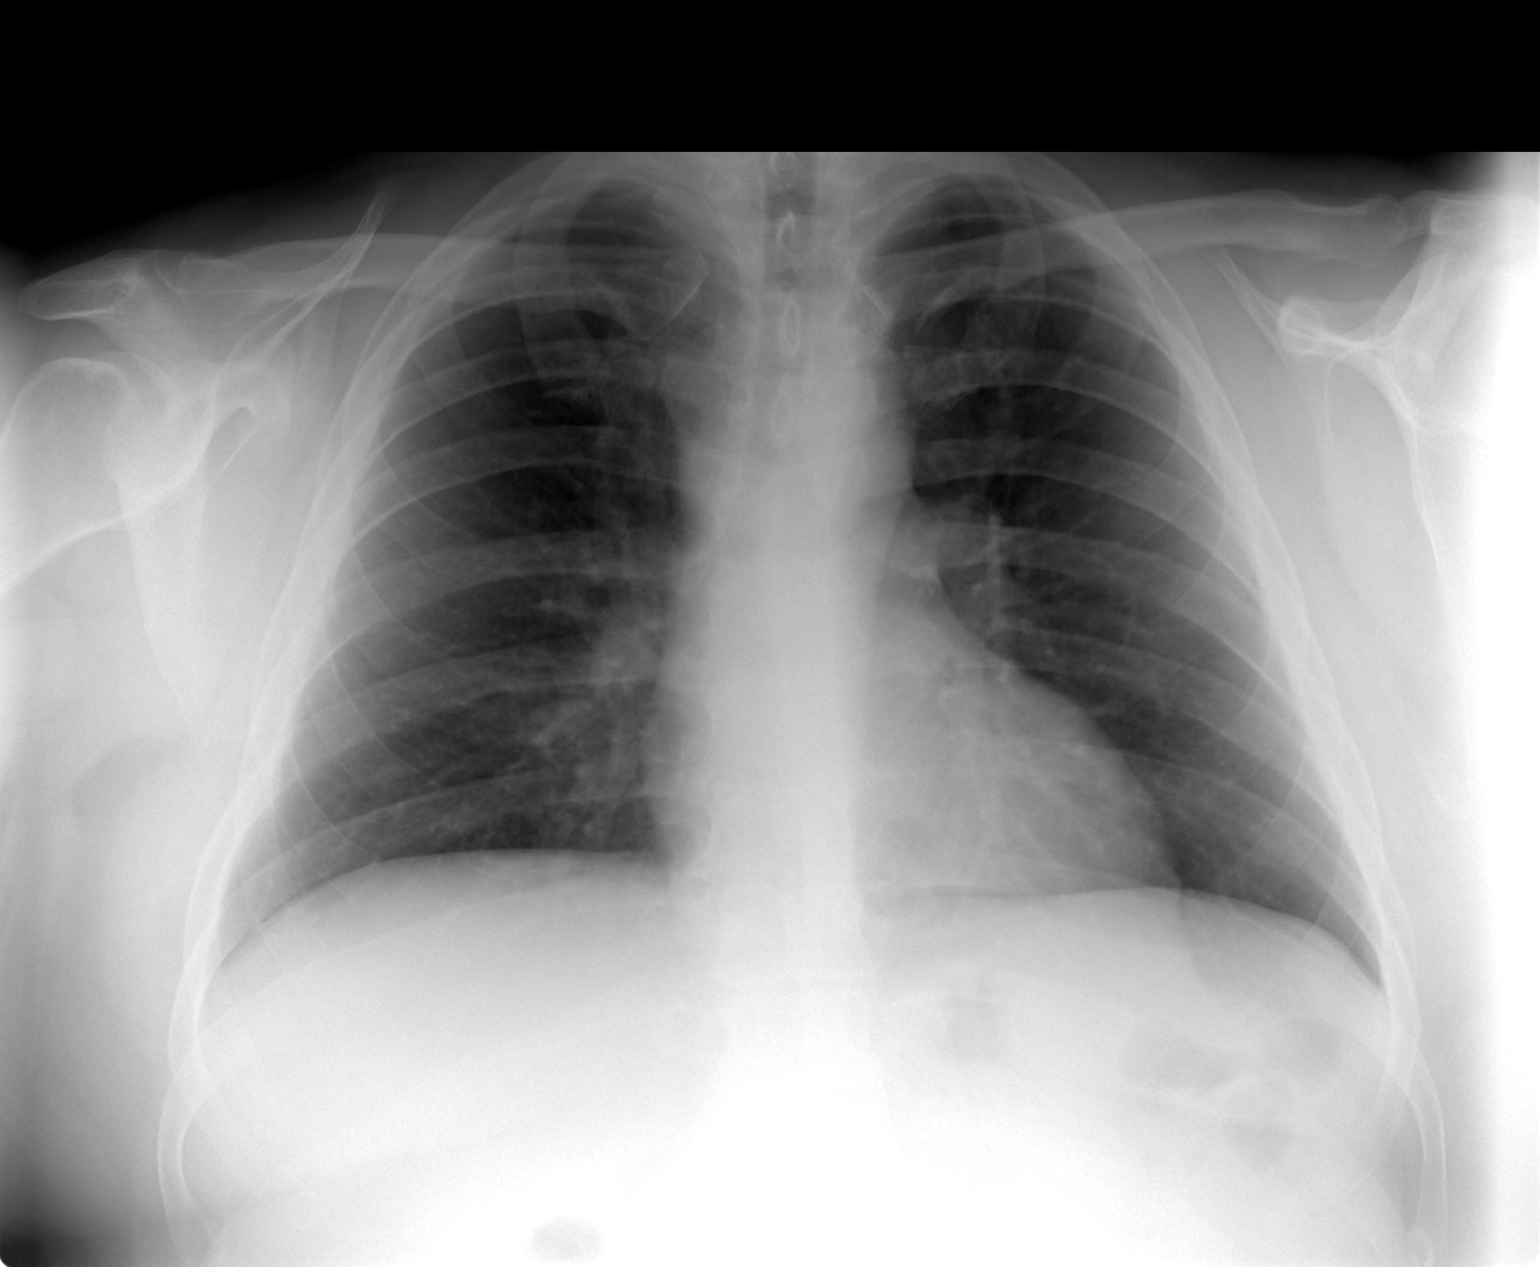

[view not recorded (2 of 2)]
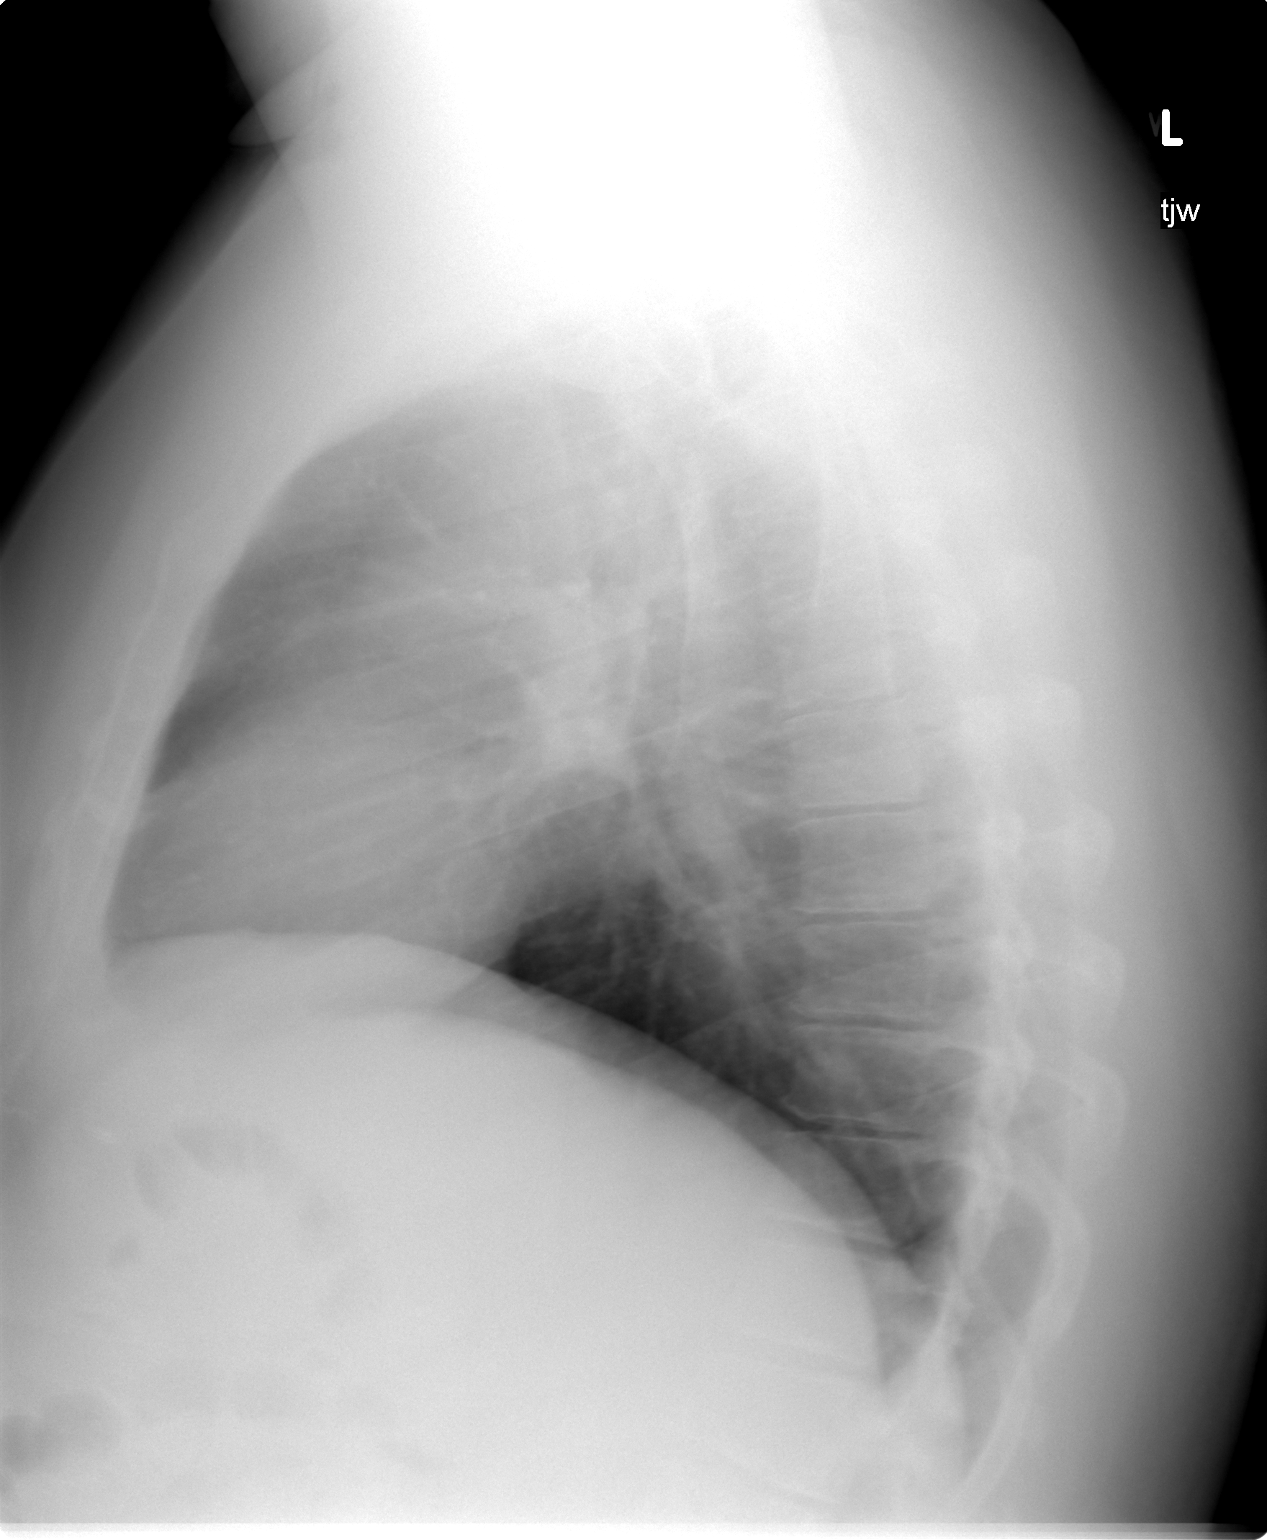

[2 of 2 positions shown; findings below may reference images not displayed]

FINDINGS: There is a prominent rhomboid fossa in both clavicles,
which probably corresponds to the density seen on   the previous
films. Lungs clear.  Heart size and pulmonary vascularity normal.
No effusion.
IMPRESSION: No acute disease

## 2014-02-25 ENCOUNTER — Other Ambulatory Visit: Payer: Self-pay | Admitting: Internal Medicine

## 2014-03-01 ENCOUNTER — Telehealth: Payer: Self-pay | Admitting: Family Medicine

## 2014-03-01 ENCOUNTER — Encounter: Payer: Self-pay | Admitting: Family Medicine

## 2014-03-01 ENCOUNTER — Ambulatory Visit (INDEPENDENT_AMBULATORY_CARE_PROVIDER_SITE_OTHER): Payer: 59 | Admitting: Family Medicine

## 2014-03-01 VITALS — BP 142/94 | HR 74 | Temp 98.4°F | Ht 71.0 in | Wt 236.5 lb

## 2014-03-01 DIAGNOSIS — E11620 Type 2 diabetes mellitus with diabetic dermatitis: Secondary | ICD-10-CM | POA: Insufficient documentation

## 2014-03-01 DIAGNOSIS — E119 Type 2 diabetes mellitus without complications: Secondary | ICD-10-CM

## 2014-03-01 DIAGNOSIS — E1169 Type 2 diabetes mellitus with other specified complication: Secondary | ICD-10-CM

## 2014-03-01 DIAGNOSIS — L988 Other specified disorders of the skin and subcutaneous tissue: Secondary | ICD-10-CM

## 2014-03-01 MED ORDER — CLOBETASOL PROPIONATE 0.05 % EX CREA: 1.0000 "application " | TOPICAL_CREAM | Freq: Every day | CUTANEOUS | Status: DC

## 2014-03-01 NOTE — Progress Notes (Signed)
Subjective:    Patient ID: Mitchell Palmer, male    DOB: 02/12/1966, 48 y.o.   MRN: [id number]  HPI Here for rash on legs - below the knee and on ankles  Started a few weeks ago  No symptoms - no itching or burning   Glucose control has been pretty  Needs follow up with Dr Cruzita Lederer- he will follow up after his eye exam    Got jock itch 2-3 weeks ago - used otc antifungal cream  That is getting better  This seems unrelated to leg rash  Patient Active Problem List   Diagnosis Date Noted  . Routine general medical examination at a health care facility 09/30/2013  . Low back pain 06/17/2012  . Abnormal chest x-ray 10/15/2011  . Right shoulder pain 10/12/2011  . PLANTAR FASCIITIS, TRAUMATIC 11/09/2009  . NEOPLASM UNCERTAIN BEHAVIOR OTHER SPEC SITES 10/07/2009  . ESOPHAGEAL STRICTURE 06/27/2009  . GERD 03/31/2009  . OTHER DYSPHAGIA 03/31/2009  . DIABETES MELLITUS, TYPE II 01/06/2009  . HYPERLIPIDEMIA 01/06/2009  . HYPERTENSION 01/06/2009  . ALLERGIC RHINITIS 01/06/2009   Past Medical History  Diagnosis Date  . Allergy     allegic rhinitis  . Diabetes mellitus     type II  . Hyperlipidemia   . Hypertension   . GERD (gastroesophageal reflux disease)   . Hyperplastic polyp of intestine 2010  . Esophageal stricture   . History of hernia repair     as a baby  . S/P ear surgery, follow-up exam     for drainage   Past Surgical History  Procedure Laterality Date  . Esophagogastroduodenoscopy  04/2009    stricture/GERD  . Hernia repair      age 31-midline  . Inner ear surgery  1988    went in behind rt ear-blockage  . Shoulder arthroscopy with rotator cuff repair and subacromial decompression Right 09/29/2012    Procedure: SHOULDER ARTHROSCOPY WITH ROTATOR CUFF REPAIR AND SUBACROMIAL DECOMPRESSION;  Surgeon: Nita Sells, MD;  Location: [facility];  Service: Orthopedics;  Laterality: Right;  Right shoulder arthroscopy with subacromial decompression  and distal clavicle excision & Debridement of Labrial Tear   History  Substance Use Topics  . Smoking status: Never Smoker   . Smokeless tobacco: Never Used  . Alcohol Use: Yes     Comment: 6 pk every 3 days; 12 pk/week   Family History  Problem Relation Age of Onset  . Diabetes Mother   . Cancer Father 37    colon cancer  . Diabetes Maternal Aunt   . Cancer Cousin     breast cancer  . Diabetes Other    Allergies  Allergen Reactions  . Codeine     REACTION: anxious   Current Outpatient Prescriptions on File Prior to Visit  Medication Sig Dispense Refill  . atorvastatin (LIPITOR) 40 MG tablet TAKE 1 TABLET BY MOUTH EVERY DAY  30 tablet  5  . cyclobenzaprine (FLEXERIL) 10 MG tablet Take 1 tablet (10 mg total) by mouth 3 (three) times daily as needed for muscle spasms. For low back pain  30 tablet  0  . fluticasone (FLONASE) 50 MCG/ACT nasal spray USE 2 SPRAYS IN EACH NOSTRIL ONCE A DAY  16 g  3  . glucose blood (ONE TOUCH ULTRA TEST) test strip TEST BLOOD SUGAR 4 TIMES A DAY AS DIRECTED  200 each  11  . insulin aspart (NOVOLOG) 100 UNIT/ML FlexPen Inject 8 Units into the skin 2 (two) times daily  before a meal.  15 mL  11  . insulin glargine (LANTUS) 100 UNIT/ML injection Inject 0.3 mLs (30 Units total) into the skin 2 (two) times daily.  60 mL  3  . Insulin Pen Needle (B-D UF III MINI PEN NEEDLES) 31G X 5 MM MISC Use 4x a day  300 each  11  . Insulin Syringe-Needle U-100 (B-D INS SYR ULTRAFINE 1CC/31G) 31G X 5/16" 1 ML MISC Inject 35 units into the skin every 12 hours as directed by physician.  200 each  2  . INVOKANA 300 MG TABS TAKE 1 TABLET (300 MG TOTAL) BY MOUTH DAILY BEFORE BREAKFAST.  30 tablet  2  . metFORMIN (GLUCOPHAGE) 1000 MG tablet TAKE 1 TABLET (1,000 MG TOTAL) BY MOUTH 2 (TWO) TIMES DAILY WITH A MEAL.  60 tablet  3  . naproxen (NAPROSYN) 500 MG tablet Take 1 tablet (500 mg total) by mouth 2 (two) times daily with a meal.  60 tablet  5  . olmesartan (BENICAR) 40 MG  tablet TAKE 1 TABLET (40 MG TOTAL) BY MOUTH DAILY.  30 tablet  5  . omeprazole (PRILOSEC) 40 MG capsule Take 1 capsule (40 mg total) by mouth daily. Every morning 30 minutes before breakfast meal.  30 capsule  5  . tadalafil (CIALIS) 20 MG tablet Take 1 tablet (20 mg total) by mouth as directed.  10 tablet  11  . VICTOZA 18 MG/3ML SOPN       . vitamin B-12 (CYANOCOBALAMIN) 1000 MCG tablet Take 1,000 mcg by mouth daily.         No current facility-administered medications on file prior to visit.      Review of Systems Review of Systems  Constitutional: Negative for fever, appetite change, fatigue and unexpected weight change.  Eyes: Negative for pain and visual disturbance.  Respiratory: Negative for cough and shortness of breath.   Cardiovascular: Negative for cp or palpitations    Gastrointestinal: Negative for nausea, diarrhea and constipation.  Genitourinary: Negative for urgency and frequency.  Skin: Negative for pallor and pos for rash  Neurological: Negative for weakness, light-headedness, numbness and headaches.  Hematological: Negative for adenopathy. Does not bruise/bleed easily.  Psychiatric/Behavioral: Negative for dysphoric mood. The patient is not nervous/anxious.         Objective:   Physical Exam  Constitutional: He appears well-developed and well-nourished. No distress.  obese and well appearing   HENT:  Head: Normocephalic and atraumatic.  Mouth/Throat: Oropharynx is clear and moist.  Eyes: Conjunctivae and EOM are normal. Pupils are equal, round, and reactive to light. No scleral icterus.  Neck: Normal range of motion. Neck supple. No JVD present. Carotid bruit is not present. No thyromegaly present.  Cardiovascular: Normal rate and regular rhythm.   Pulmonary/Chest: Effort normal and breath sounds normal.  Musculoskeletal: He exhibits no edema and no tenderness.  Lymphadenopathy:    He has no cervical adenopathy.  Neurological: He is alert. He has normal  reflexes.  Skin: Skin is warm and dry. Rash noted. No pallor.  Discrete spots that resemble 1 cm whelps or smaller- and firmer - pink in color On lower legs  No ticks or tick bites noted  No excoriations or scale or vesicles  Psychiatric: He has a normal mood and affect.          Assessment & Plan:   Problem List Items Addressed This Visit     Endocrine   DIABETES MELLITUS, TYPE II - Primary  A1C today -will send to Dr Renne Crigler and he will make a f/u appt     Relevant Orders      Hemoglobin A1c     Musculoskeletal and Integument   Necrobiosis diabeticorum     Lesions on legs are small - but no itching or exposures to suspect other cause Will try clobetasol cream at bedtime and watch Update if worse or new sympt or no imp in 2 wk Urged to f/u with his endocrinologist

## 2014-03-01 NOTE — Patient Instructions (Signed)
You may have a diabetic rash on legs called Necrobiosis lipoidica (diabetic related) Use the clobetasol cream on the spots at bedtime - cover if you need to  If worse/ itchy / burning or no improvement in several weeks let me know  Don't forget to make appt with your eye doctor and Dr Cruzita Lederer A1C today

## 2014-03-01 NOTE — Progress Notes (Signed)
Pre visit review using our clinic review tool, if applicable. No additional management support is needed unless otherwise documented below in the visit note. 

## 2014-03-01 NOTE — Assessment & Plan Note (Signed)
A1C today -will send to Dr Renne Crigler and he will make a f/u appt

## 2014-03-01 NOTE — Telephone Encounter (Signed)
Pt called and wants to see Dr. Glori Bickers for another issue. Scheduled apptmt for 03/01/2014 w/Dr. Glori Bickers and he will have labs drawn afterwards.

## 2014-03-01 NOTE — Assessment & Plan Note (Signed)
Lesions on legs are small - but no itching or exposures to suspect other cause Will try clobetasol cream at bedtime and watch Update if worse or new sympt or no imp in 2 wk Urged to f/u with his endocrinologist

## 2014-03-01 NOTE — Telephone Encounter (Signed)
Diabetic Bundle.  Pt needs lab appt to check A1C.  Left message with daughter for pt to return call.

## 2014-03-02 LAB — HEMOGLOBIN A1C: Hgb A1c MFr Bld: 9.1 % — ABNORMAL HIGH (ref 4.6–6.5)

## 2014-03-05 ENCOUNTER — Encounter: Payer: Self-pay | Admitting: Gastroenterology

## 2014-03-19 ENCOUNTER — Other Ambulatory Visit: Payer: Self-pay | Admitting: Internal Medicine

## 2014-03-24 ENCOUNTER — Ambulatory Visit (INDEPENDENT_AMBULATORY_CARE_PROVIDER_SITE_OTHER): Payer: 59 | Admitting: Family Medicine

## 2014-03-24 ENCOUNTER — Encounter: Payer: Self-pay | Admitting: Family Medicine

## 2014-03-24 VITALS — BP 116/70 | HR 74 | Temp 98.1°F | Ht 71.0 in | Wt 235.5 lb

## 2014-03-24 DIAGNOSIS — M545 Low back pain, unspecified: Secondary | ICD-10-CM

## 2014-03-24 DIAGNOSIS — M25561 Pain in right knee: Secondary | ICD-10-CM

## 2014-03-24 DIAGNOSIS — M25569 Pain in unspecified knee: Secondary | ICD-10-CM

## 2014-03-24 MED ORDER — DICLOFENAC SODIUM 75 MG PO TBEC: 75.0000 mg | DELAYED_RELEASE_TABLET | Freq: Two times a day (BID) | ORAL | Status: DC

## 2014-03-24 MED ORDER — CYCLOBENZAPRINE HCL 10 MG PO TABS: 10.0000 mg | ORAL_TABLET | Freq: Three times a day (TID) | ORAL | Status: DC | PRN

## 2014-03-24 MED ORDER — HYDROCODONE-ACETAMINOPHEN 5-325 MG PO TABS: 1.0000 | ORAL_TABLET | Freq: Four times a day (QID) | ORAL | Status: DC | PRN

## 2014-03-24 NOTE — Progress Notes (Signed)
Pre visit review using our clinic review tool, if applicable. No additional management support is needed unless otherwise documented below in the visit note. 
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
## 2014-05-14 ENCOUNTER — Other Ambulatory Visit: Payer: Self-pay | Admitting: Family Medicine

## 2014-05-14 NOTE — Telephone Encounter (Signed)
Pt request refill omeprazole to [facility]; pt advised done.

## 2014-06-09 ENCOUNTER — Other Ambulatory Visit: Payer: Self-pay | Admitting: Internal Medicine

## 2014-06-10 ENCOUNTER — Other Ambulatory Visit: Payer: Self-pay | Admitting: Internal Medicine

## 2014-06-14 ENCOUNTER — Other Ambulatory Visit: Payer: Self-pay | Admitting: Internal Medicine

## 2014-06-17 ENCOUNTER — Other Ambulatory Visit: Payer: Self-pay | Admitting: Internal Medicine

## 2014-06-19 ENCOUNTER — Other Ambulatory Visit: Payer: Self-pay | Admitting: Family Medicine

## 2014-06-23 ENCOUNTER — Other Ambulatory Visit: Payer: Self-pay | Admitting: Family Medicine

## 2014-06-24 NOTE — Telephone Encounter (Signed)
Please schedule f/u in march and refill until then

## 2014-06-24 NOTE — Telephone Encounter (Signed)
Electronic refill request, no recent/future f/u or CPE appt. with you, please advise

## 2014-06-25 NOTE — Telephone Encounter (Signed)
Pt request earlier appt., appt scheduled and med refilled

## 2014-06-30 ENCOUNTER — Encounter: Payer: Self-pay | Admitting: Family Medicine

## 2014-06-30 ENCOUNTER — Ambulatory Visit (INDEPENDENT_AMBULATORY_CARE_PROVIDER_SITE_OTHER): Payer: 59 | Admitting: Family Medicine

## 2014-06-30 ENCOUNTER — Telehealth: Payer: Self-pay | Admitting: Family Medicine

## 2014-06-30 ENCOUNTER — Ambulatory Visit (INDEPENDENT_AMBULATORY_CARE_PROVIDER_SITE_OTHER)
Admission: RE | Admit: 2014-06-30 | Discharge: 2014-06-30 | Disposition: A | Discharge status: Home / Self Care | Payer: 59 | Source: Ambulatory Visit | Attending: Family Medicine | Admitting: Family Medicine

## 2014-06-30 VITALS — BP 110/66 | HR 79 | Temp 98.5°F | Ht 71.0 in | Wt 241.8 lb

## 2014-06-30 DIAGNOSIS — R221 Localized swelling, mass and lump, neck: Secondary | ICD-10-CM

## 2014-06-30 DIAGNOSIS — R220 Localized swelling, mass and lump, head: Secondary | ICD-10-CM

## 2014-06-30 DIAGNOSIS — Z23 Encounter for immunization: Secondary | ICD-10-CM

## 2014-06-30 LAB — CREATININE, SERUM: Creatinine, Ser: 1.2 mg/dL (ref 0.4–1.5)

## 2014-06-30 LAB — BUN: BUN: 14 mg/dL (ref 6–23)

## 2014-06-30 MED ORDER — IOHEXOL 300 MG/ML  SOLN
80.0000 mL | Freq: Once | INTRAMUSCULAR | Status: AC | PRN
  Administered 2014-06-30: 80 mL via INTRAVENOUS

## 2014-06-30 NOTE — Progress Notes (Signed)
Pre visit review using our clinic review tool, if applicable. No additional management support is needed unless otherwise documented below in the visit note. 
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
## 2014-06-30 NOTE — Patient Instructions (Signed)
Please make an appt with your diabetes doctor for follow up - your diabetes is not in optimal control Stop up front for a referral for CT scan of the neck  Flu shot today  Take care of yourself

## 2014-06-30 NOTE — Telephone Encounter (Signed)
I spoke to patient and clarified his questions about ENT evaluation-not promising what the doctor would do and not offering him 40 $ .  He voiced understanding.  I also made it clear that we do not tolerate rudeness to anyone here.  In any case- he is open to an ENT consult - he prefers Dr Ernesto Rutherford if available.  Referral done

## 2014-06-30 NOTE — Assessment & Plan Note (Signed)
Pt c/o lump on R post neck that has become larger and more bothersome  Hurts to rotate neck  Is mildly tender - does not feel like a cyst  LN or lipoma are in the differential Disc CT vs ref to ENT-pt pref CT -will order that and make a plan based on result

## 2014-06-30 NOTE — Assessment & Plan Note (Signed)
Pt has not been compliant about endocrinology follow up appts  Urged him to make f/u with Dr Cruzita Lederer -he did not want a referral  Lab Results  Component Value Date   HGBA1C 9.1* 03/01/2014

## 2014-06-30 NOTE — Telephone Encounter (Signed)
-----   Message from Carter Kitten, [place] sent at 06/30/2014  6:39 PM EST ----- Spoke with Shanon Brow and gave results of CT results.  Advised Dr. Glori Bickers recommended a referral to ENT for biopsy.  Patient wants to know why ENT when the lump in on the back of his neck.  I advised I would have to ask Dr. Glori Bickers that question.  He states if he goes to Dr. Ernesto Rutherford and he tells him he needs to see someone else for the biospy that Dr. Glori Bickers will owe him  $40 for his co-pay.  I advised I would relay this message to Dr. Glori Bickers to see what she recommends.

## 2014-07-10 ENCOUNTER — Other Ambulatory Visit: Payer: Self-pay | Admitting: Family Medicine

## 2014-07-12 ENCOUNTER — Other Ambulatory Visit: Payer: Self-pay | Admitting: *Deleted

## 2014-07-19 ENCOUNTER — Other Ambulatory Visit: Payer: Self-pay | Admitting: Internal Medicine

## 2014-08-24 LAB — HM DIABETES EYE EXAM

## 2014-08-25 ENCOUNTER — Telehealth: Payer: Self-pay | Admitting: Family Medicine

## 2014-08-25 NOTE — Telephone Encounter (Signed)
Rec'd from [place] forward 1 page to Dr. Glori Bickers

## 2014-09-14 ENCOUNTER — Encounter: Payer: Self-pay | Admitting: Family Medicine

## 2014-09-20 ENCOUNTER — Other Ambulatory Visit: Payer: Self-pay | Admitting: Internal Medicine

## 2014-10-09 ENCOUNTER — Other Ambulatory Visit: Payer: Self-pay | Admitting: Family Medicine

## 2014-10-23 ENCOUNTER — Other Ambulatory Visit: Payer: Self-pay | Admitting: Family Medicine

## 2014-10-23 ENCOUNTER — Other Ambulatory Visit: Payer: Self-pay | Admitting: Internal Medicine

## 2014-11-05 ENCOUNTER — Other Ambulatory Visit: Payer: Self-pay | Admitting: Family Medicine

## 2014-11-16 ENCOUNTER — Encounter: Payer: Self-pay | Admitting: Gastroenterology

## 2014-11-22 ENCOUNTER — Other Ambulatory Visit: Payer: Self-pay | Admitting: Family Medicine

## 2014-11-26 ENCOUNTER — Other Ambulatory Visit: Payer: Self-pay | Admitting: Family Medicine

## 2014-12-01 ENCOUNTER — Other Ambulatory Visit: Payer: Self-pay | Admitting: Internal Medicine

## 2014-12-03 ENCOUNTER — Telehealth: Payer: Self-pay | Admitting: Internal Medicine

## 2014-12-03 ENCOUNTER — Other Ambulatory Visit: Payer: Self-pay | Admitting: Family Medicine

## 2014-12-03 MED ORDER — METFORMIN HCL 1000 MG PO TABS: ORAL_TABLET | ORAL | Status: DC

## 2014-12-03 NOTE — Telephone Encounter (Signed)
Patient called and would like a refill on his medication   Rx: Metformin   Pharmacy: [facility]    Thank you

## 2014-12-03 NOTE — Telephone Encounter (Signed)
Done

## 2014-12-06 ENCOUNTER — Encounter: Payer: Self-pay | Admitting: Family Medicine

## 2014-12-06 ENCOUNTER — Ambulatory Visit (INDEPENDENT_AMBULATORY_CARE_PROVIDER_SITE_OTHER): Payer: 59 | Admitting: Family Medicine

## 2014-12-06 VITALS — BP 126/72 | HR 85 | Temp 98.4°F | Ht 69.0 in | Wt 248.5 lb

## 2014-12-06 DIAGNOSIS — E1165 Type 2 diabetes mellitus with hyperglycemia: Secondary | ICD-10-CM | POA: Diagnosis not present

## 2014-12-06 DIAGNOSIS — IMO0002 Reserved for concepts with insufficient information to code with codable children: Secondary | ICD-10-CM

## 2014-12-06 DIAGNOSIS — E785 Hyperlipidemia, unspecified: Secondary | ICD-10-CM | POA: Diagnosis not present

## 2014-12-06 DIAGNOSIS — B9789 Other viral agents as the cause of diseases classified elsewhere: Secondary | ICD-10-CM

## 2014-12-06 DIAGNOSIS — J069 Acute upper respiratory infection, unspecified: Secondary | ICD-10-CM

## 2014-12-06 DIAGNOSIS — F1010 Alcohol abuse, uncomplicated: Secondary | ICD-10-CM | POA: Diagnosis not present

## 2014-12-06 DIAGNOSIS — Z789 Other specified health status: Secondary | ICD-10-CM

## 2014-12-06 DIAGNOSIS — I10 Essential (primary) hypertension: Secondary | ICD-10-CM

## 2014-12-06 DIAGNOSIS — K219 Gastro-esophageal reflux disease without esophagitis: Secondary | ICD-10-CM

## 2014-12-06 LAB — CBC WITH DIFFERENTIAL/PLATELET
BASOS ABS: 0 10*3/uL (ref 0.0–0.1)
Basophils Relative: 0.5 % (ref 0.0–3.0)
EOS ABS: 0.4 10*3/uL (ref 0.0–0.7)
EOS PCT: 4.4 % (ref 0.0–5.0)
HEMATOCRIT: 47.9 % (ref 39.0–52.0)
Hemoglobin: 16.6 g/dL (ref 13.0–17.0)
LYMPHS ABS: 1.8 10*3/uL (ref 0.7–4.0)
Lymphocytes Relative: 21 % (ref 12.0–46.0)
MCHC: 34.6 g/dL (ref 30.0–36.0)
MCV: 97.6 fl (ref 78.0–100.0)
Monocytes Absolute: 0.9 10*3/uL (ref 0.1–1.0)
Monocytes Relative: 10.6 % (ref 3.0–12.0)
NEUTROS PCT: 63.5 % (ref 43.0–77.0)
Neutro Abs: 5.3 10*3/uL (ref 1.4–7.7)
PLATELETS: 252 10*3/uL (ref 150.0–400.0)
RBC: 4.91 Mil/uL (ref 4.22–5.81)
RDW: 12.6 % (ref 11.5–15.5)
WBC: 8.4 10*3/uL (ref 4.0–10.5)

## 2014-12-06 LAB — COMPREHENSIVE METABOLIC PANEL
ALBUMIN: 4.4 g/dL (ref 3.5–5.2)
ALK PHOS: 75 U/L (ref 39–117)
ALT: 40 U/L (ref 0–53)
AST: 28 U/L (ref 0–37)
BUN: 19 mg/dL (ref 6–23)
CO2: 28 mEq/L (ref 19–32)
Calcium: 10.5 mg/dL (ref 8.4–10.5)
Chloride: 98 mEq/L (ref 96–112)
Creatinine, Ser: 1.22 mg/dL (ref 0.40–1.50)
GFR: 67.46 mL/min (ref >60.00)
GLUCOSE: 150 mg/dL — AB (ref 70–99)
POTASSIUM: 4.5 meq/L (ref 3.5–5.1)
Sodium: 135 mEq/L (ref 135–145)
Total Bilirubin: 0.6 mg/dL (ref 0.2–1.2)
Total Protein: 6.9 g/dL (ref 6.0–8.3)

## 2014-12-06 LAB — LIPID PANEL
Cholesterol: 147 mg/dL (ref 0–200)
HDL: 37.2 mg/dL — ABNORMAL LOW (ref >39.00)
NonHDL: 109.8
Total CHOL/HDL Ratio: 4
Triglycerides: 225 mg/dL — ABNORMAL HIGH (ref 0.0–149.0)
VLDL: 45 mg/dL — ABNORMAL HIGH (ref 0.0–40.0)

## 2014-12-06 LAB — HEMOGLOBIN A1C: Hgb A1c MFr Bld: 8.9 % — ABNORMAL HIGH (ref 4.6–6.5)

## 2014-12-06 LAB — TSH: TSH: 7.07 u[IU]/mL — AB (ref 0.35–4.50)

## 2014-12-06 LAB — LDL CHOLESTEROL, DIRECT: LDL DIRECT: 79 mg/dL

## 2014-12-06 MED ORDER — OMEPRAZOLE 40 MG PO CPDR: 40.0000 mg | DELAYED_RELEASE_CAPSULE | Freq: Every day | ORAL | Status: DC

## 2014-12-06 NOTE — Patient Instructions (Signed)
I think you have a viral upper respiratory infection  Try claritin over the counter for post nasal drip  mucinex for congestion  Rest voice and drink fluids  Update if not starting to improve in a week or if worsening   Labs today  Follow up with Dr Cruzita Lederer tomorrow as planned  For health - work on cutting down alcohol use

## 2014-12-06 NOTE — Progress Notes (Signed)
Pre visit review using our clinic review tool, if applicable. No additional management support is needed unless otherwise documented below in the visit note. 

## 2014-12-06 NOTE — Assessment & Plan Note (Signed)
bp in fair control at this time  BP Readings from Last 1 Encounters:  12/06/14 126/72   No changes needed Disc lifstyle change with low sodium diet and exercise  Enc to cut alcohol use  Lab today

## 2014-12-06 NOTE — Assessment & Plan Note (Signed)
Lipid panel today  Goal LDL under 100 for diabetic  On statin and diet (fair diet)

## 2014-12-06 NOTE — Assessment & Plan Note (Signed)
Has f/u with endocrine tomorrow - did A1C today for that visit

## 2014-12-06 NOTE — Progress Notes (Signed)
Subjective:    Patient ID: Mitchell Palmer, male    DOB: 10/18/1965, 49 y.o.   MRN: [id number]  HPI  Here for several issues   Uri/allergies?   Started Friday night  Sore throat and hoarse Congestion-worse on sat - a little improved now  Some cough  Clear mucous changed colors to yellow -chest and head  R ear hurt last week  Some sinus pressure   No fever - felt warm one time   Took theraflu otc over the weekend -seemed to help   Does have hx of seasonal allergies  Mows yard on Wed    Needs refill of prilosec for his GERD- it helps- 40 mg  Wt is up 7 lb with bmi of 69  Px originally by Dr Fuller Plan - is due for f/u as well -hx of esoph stricture that was stretched No swallowing problems   Diabetes- doing a little better-sees Dr Cruzita Lederer tomorrow   Patient Active Problem List   Diagnosis Date Noted  . Swelling, mass, or lump in head and neck 06/30/2014  . Necrobiosis diabeticorum 03/01/2014  . Routine general medical examination at a health care facility 09/30/2013  . Low back pain 06/17/2012  . Abnormal chest x-ray 10/15/2011  . Right shoulder pain 10/12/2011  . PLANTAR FASCIITIS, TRAUMATIC 11/09/2009  . NEOPLASM UNCERTAIN BEHAVIOR OTHER SPEC SITES 10/07/2009  . ESOPHAGEAL STRICTURE 06/27/2009  . GERD 03/31/2009  . OTHER DYSPHAGIA 03/31/2009  . DM (diabetes mellitus), type 2, uncontrolled 01/06/2009  . HYPERLIPIDEMIA 01/06/2009  . HYPERTENSION 01/06/2009  . ALLERGIC RHINITIS 01/06/2009   Past Medical History  Diagnosis Date  . Allergy     allegic rhinitis  . Diabetes mellitus     type II  . Hyperlipidemia   . Hypertension   . GERD (gastroesophageal reflux disease)   . Hyperplastic polyp of intestine 2010  . Esophageal stricture   . History of hernia repair     as a baby  . S/P ear surgery, follow-up exam     for drainage   Past Surgical History  Procedure Laterality Date  . Esophagogastroduodenoscopy  04/2009    stricture/GERD  . Hernia repair      age  34-midline  . Inner ear surgery  1988    went in behind rt ear-blockage  . Shoulder arthroscopy with rotator cuff repair and subacromial decompression Right 09/29/2012    Procedure: SHOULDER ARTHROSCOPY WITH ROTATOR CUFF REPAIR AND SUBACROMIAL DECOMPRESSION;  Surgeon: Nita Sells, MD;  Location: [facility];  Service: Orthopedics;  Laterality: Right;  Right shoulder arthroscopy with subacromial decompression and distal clavicle excision & Debridement of Labrial Tear   History  Substance Use Topics  . Smoking status: Never Smoker   . Smokeless tobacco: Never Used  . Alcohol Use: 0.0 oz/week    0 Standard drinks or equivalent per week     Comment: 6 pk every 3 days; 12 pk/week   Family History  Problem Relation Age of Onset  . Diabetes Mother   . Cancer Father 64    colon cancer  . Diabetes Maternal Aunt   . Cancer Cousin     breast cancer  . Diabetes Other    Allergies  Allergen Reactions  . Codeine     REACTION: anxious   Current Outpatient Prescriptions on File Prior to Visit  Medication Sig Dispense Refill  . atorvastatin (LIPITOR) 40 MG tablet TAKE 1 TABLET BY MOUTH EVERY DAY 30 tablet 5  . B-D INS  SYR ULTRAFINE 1CC/31G 31G X 5/16" 1 ML MISC INJECT 35 UNITS INTO THE SKIN EVERY 12 HOURS AS DIRECTED BY PHYSICIAN. 200 each 2  . BENICAR 40 MG tablet TAKE 1 TABLET BY MOUTH EVERY DAY 30 tablet 5  . cyclobenzaprine (FLEXERIL) 10 MG tablet Take 1 tablet (10 mg total) by mouth 3 (three) times daily as needed for muscle spasms. For low back pain 40 tablet 1  . diclofenac (VOLTAREN) 75 MG EC tablet Take 1 tablet (75 mg total) by mouth 2 (two) times daily. 60 tablet 3  . fluticasone (FLONASE) 50 MCG/ACT nasal spray USE 2 SPRAYS IN EACH NOSTRIL ONCE A DAY 16 g 3  . glucose blood (ONE TOUCH ULTRA TEST) test strip TEST BLOOD SUGAR 4 TIMES A DAY AS DIRECTED 200 each 11  . HYDROcodone-acetaminophen (NORCO/VICODIN) 5-325 MG per tablet Take 1 tablet by mouth every 6  (six) hours as needed for moderate pain. 20 tablet 0  . insulin aspart (NOVOLOG) 100 UNIT/ML FlexPen Inject 8 Units into the skin 2 (two) times daily before a meal. 15 mL 11  . Insulin Pen Needle (B-D UF III MINI PEN NEEDLES) 31G X 5 MM MISC Use 4x a day 300 each 11  . INVOKANA 300 MG TABS TAKE 1 TABLET (300 MG TOTAL) BY MOUTH DAILY BEFORE BREAKFAST. 30 tablet 2  . LANTUS 100 UNIT/ML injection INJECT 0.3 MLS (30 UNITS TOTAL) INTO THE SKIN 2 (TWO) TIMES DAILY. 60 mL 2  . metFORMIN (GLUCOPHAGE) 1000 MG tablet TAKE 1 TABLET (1,000 MG TOTAL) BY MOUTH 2 (TWO) TIMES DAILY WITH A MEAL. 60 tablet 0  . omeprazole (PRILOSEC) 40 MG capsule TAKE 1 CAPSULE (40 MG TOTAL) BY MOUTH DAILY. EVERY MORNING 30 MINUTES BEFORE BREAKFAST MEAL. 30 capsule 0  . ONE TOUCH ULTRA TEST test strip TEST BLOOD SUGAR 2 TIMES A DAY AS DIRECTED 100 each 11  . tadalafil (CIALIS) 20 MG tablet Take 1 tablet (20 mg total) by mouth as directed. 10 tablet 11  . VICTOZA 18 MG/3ML SOPN     . vitamin B-12 (CYANOCOBALAMIN) 1000 MCG tablet Take 1,000 mcg by mouth daily.       No current facility-administered medications on file prior to visit.       Review of Systems Review of Systems  Constitutional: Negative for fever, appetite change,  and unexpected weight change.  ENt pos for cong and rhinorrhea and post nasal drip and sinus pressure  Eyes: Negative for pain and visual disturbance.  Respiratory: Negative for wheeze and shortness of breath.   Cardiovascular: Negative for cp or palpitations    Gastrointestinal: Negative for nausea, diarrhea and constipation.  Genitourinary: Negative for urgency and frequency.  Skin: Negative for pallor or rash   Neurological: Negative for weakness, light-headedness, numbness and headaches.  Hematological: Negative for adenopathy. Does not bruise/bleed easily.  Psychiatric/Behavioral: Negative for dysphoric mood. The patient is not nervous/anxious.         Objective:   Physical Exam    Constitutional: He appears well-developed and well-nourished. No distress.  obese and well appearing   HENT:  Head: Normocephalic and atraumatic.  Right Ear: External ear normal.  Left Ear: External ear normal.  Mouth/Throat: Oropharynx is clear and moist.  Nares are injected and congested  Clear rhinorrhea No sinus tenderness Throat is clear   Eyes: Conjunctivae and EOM are normal. Pupils are equal, round, and reactive to light. Right eye exhibits no discharge. Left eye exhibits no discharge. No scleral icterus.  Neck:  Normal range of motion. Neck supple. No JVD present. Carotid bruit is not present. No thyromegaly present.  Cardiovascular: Normal rate, regular rhythm, normal heart sounds and intact distal pulses.  Exam reveals no gallop.   Pulmonary/Chest: Effort normal and breath sounds normal. No respiratory distress. He has no wheezes. He has no rales. He exhibits no tenderness.  Good air exch  Abdominal: Soft. Bowel sounds are normal. He exhibits no distension, no abdominal bruit and no mass. There is no tenderness.  Musculoskeletal: He exhibits no edema or tenderness.  Lymphadenopathy:    He has no cervical adenopathy.  Neurological: He is alert. He has normal reflexes. No cranial nerve deficit. He exhibits normal muscle tone. Coordination normal.  Skin: Skin is warm and dry. No rash noted. No erythema. No pallor.  Psychiatric: He has a normal mood and affect.          Assessment & Plan:   Problem List Items Addressed This Visit      Cardiovascular and Mediastinum   Essential hypertension - Primary    bp in fair control at this time  BP Readings from Last 1 Encounters:  12/06/14 126/72   No changes needed Disc lifstyle change with low sodium diet and exercise  Enc to cut alcohol use  Lab today      Relevant Orders   CBC with Differential/Platelet (Completed)   Comprehensive metabolic panel (Completed)   TSH (Completed)     Respiratory   Viral URI with cough     Reassuring exam Disc symptomatic care - see instructions on AVS  Update if not starting to improve in a week or if worsening  -esp if sinus pain or worse prod cough        Digestive   GERD    Refilled prilosec 40 mg and symptom free  Will continue  Hx of past esoph stricture  No further swallowing problems  Disc diet / recommend cutting alcohol intake       Relevant Medications   omeprazole (PRILOSEC) 40 MG capsule     Other   Alcohol consumption heavy    Pt admits to 6 or more drinks a day on avg Denies dependence  Disc impact on health/especially diabetes - pt understands it is in his best interest to quit/ or at least cut down       DM (diabetes mellitus), type 2, uncontrolled    Has f/u with endocrine tomorrow - did A1C today for that visit       Relevant Orders   Hemoglobin A1c (Completed)   Hyperlipidemia    Lipid panel today  Goal LDL under 100 for diabetic  On statin and diet (fair diet)      Relevant Orders   Lipid panel (Completed)

## 2014-12-06 NOTE — Assessment & Plan Note (Signed)
Pt admits to 6 or more drinks a day on avg Denies dependence  Disc impact on health/especially diabetes - pt understands it is in his best interest to quit/ or at least cut down

## 2014-12-06 NOTE — Assessment & Plan Note (Signed)
Reassuring exam Disc symptomatic care - see instructions on AVS  Update if not starting to improve in a week or if worsening  -esp if sinus pain or worse prod cough

## 2014-12-06 NOTE — Assessment & Plan Note (Signed)
Refilled prilosec 40 mg and symptom free  Will continue  Hx of past esoph stricture  No further swallowing problems  Disc diet / recommend cutting alcohol intake

## 2014-12-07 ENCOUNTER — Encounter: Payer: Self-pay | Admitting: Internal Medicine

## 2014-12-07 ENCOUNTER — Ambulatory Visit (INDEPENDENT_AMBULATORY_CARE_PROVIDER_SITE_OTHER): Payer: 59 | Admitting: Internal Medicine

## 2014-12-07 ENCOUNTER — Other Ambulatory Visit (INDEPENDENT_AMBULATORY_CARE_PROVIDER_SITE_OTHER): Payer: 59

## 2014-12-07 VITALS — BP 132/88 | HR 110 | Temp 98.2°F | Ht 71.0 in | Wt 244.0 lb

## 2014-12-07 DIAGNOSIS — E1165 Type 2 diabetes mellitus with hyperglycemia: Secondary | ICD-10-CM

## 2014-12-07 DIAGNOSIS — R946 Abnormal results of thyroid function studies: Secondary | ICD-10-CM | POA: Diagnosis not present

## 2014-12-07 DIAGNOSIS — IMO0002 Reserved for concepts with insufficient information to code with codable children: Secondary | ICD-10-CM

## 2014-12-07 LAB — T4, FREE: Free T4: 0.91 ng/dL (ref 0.60–1.60)

## 2014-12-07 LAB — MICROALBUMIN / CREATININE URINE RATIO
Creatinine,U: 67.1 mg/dL
MICROALB/CREAT RATIO: 1 mg/g (ref 0.0–30.0)
Microalb, Ur: <0.7 mg/dL (ref 0.0–1.9)

## 2014-12-07 MED ORDER — INSULIN GLARGINE 100 UNIT/ML ~~LOC~~ SOLN: SUBCUTANEOUS | Status: DC

## 2014-12-07 NOTE — Patient Instructions (Signed)
Patient Instructions  - continue Metformin 1000 mg twice a day - continue Invokana 300 mg daily - continue NovoLog 10-12 units with a meal and 8 units with a snack Increase: - Lantus to 40 units at the beginning of our day and 30 units at bedtime  Please check with your insurance if they cover: - Levemir rather than Lantus  - Toujeo (3x more concentrated Lantus) - Trulicity (or Tanzeum) - once a week  Please stop at the lab.

## 2014-12-07 NOTE — Progress Notes (Signed)
Subjective:     Patient ID: Mitchell Palmer, male   DOB: 09-25-1965, 49 y.o.   MRN: [id number]  Diabetes  Mitchell Palmer is a 49 y.o. man, returning for followup for DM2, dx in 2000, uncontrolled, insulin-dependent, with complications (CKD stage 3, erectile dysfunction). Last visit was 1 year ago!  His last hemoglobin A1c was: Lab Results  Component Value Date   HGBA1C 8.9* 12/06/2014   HGBA1C 9.1* 03/01/2014   HGBA1C 8.9* 09/30/2013  He had steroid inj's in the past.  He is on a regimen of: - Metformin 1000 mg twice a day (also takes B12 vitamin po daily) - Lantus 30 bid (vials) - Invokana 300 mg daily - NovoLog 8 (snack) -12 units (larger meal) 3x a day  He was on: - Cycloset - started 04/22/2013 >> at 1.6 mg daily (tried 3 tabs >> dizziness >> backed off to 2) - Victoza 1.8 mg daily - Actos 45 mg daily - Januvia - glyburide/metformin - Avandia. - Bydureon 2 mg weekly - he hated the thick needle   He is checking his sugars 2-3 a day (he has a One Touch ultra meter). No log/meter today: - am: 112-114 (higher if pizza at night); 140-160s >> 118-165 (181, 199) >> 180s - 2 hours after breakfast: 200s >> 155-286 >> 183-220 >> 91-231 >> 180-220 >> 178-216 >> n/c - before lunch:  150s >> 132, one value >> 83-150 >> 83, 94 >> 112-140 >> 133 >> 180s - 2 hours after lunch: 129, 216 >> 175-220 >> 123, 222, 259 >> n/c - Before dinner: 200s >> 200-282 >> 220-250 >> 191-294 >> 180s >> 196, 200 >> n/c - bedtime 280-312 >> 200s and had 1x 300 >> up to 300 (pizza) >> 180s >> n/c >> 125-158 (cut down on pizza) Lowest: 75 - in the middle of his night - . He has hypoglycemia awareness ~80. Highest 310.  He works nights: 6 pm - 7:30 am. Works 3 days a week.   Meals: - 5:30-6 pm (at work): 2 bread slices + meat - snack at 9-10 pm: pistachios or 1/2 sandwich - 4:30 am: coffee - 11 am-12 pm: salad, meat + veggies + rice (leftovers from take out) He was drinking regular soda >> was drinking 1-3 a  day >> cut down on these.  - Has HL (increased TG). Last lipid panel: Lab Results  Component Value Date   CHOL 147 12/06/2014   HDL 37.20* 12/06/2014   LDLCALC 62 09/30/2013   LDLDIRECT 79.0 12/06/2014   TRIG 225.0* 12/06/2014   CHOLHDL 4 12/06/2014  He is on Atorvastatin. - last eye exam was with Dr. Katy Fitch in 08/2014, mild retinopathy, L>R  - No numbness or tingling in his legs.  - He does have chronic kidney disease - stage 2, last BUN/Cr:  Lab Results  Component Value Date   BUN 19 12/06/2014   CREATININE 1.22 12/06/2014  He is on Olmestartan.  The patient also has a history of hyperlipidemia, hypertension, GERD, esophageal stricture, plantar fasciitis, eustachian tube dysfunction, status post ear surgery, history of increased alcohol use.   Review of Systems Constitutional: + weight gain, no fatigue, + increased appetite Eyes: no blurry vision, no xerophthalmia ENT: + sore throat, no nodules palpated in throat, no dysphagia/odynophagia, + hoarseness Cardiovascular: no CP/SOB/palpitations/leg swelling Respiratory: no cough/SOB Gastrointestinal: no N/V/D/C Musculoskeletal: + muscle aches/no joint aches/joint swelling Skin: no rashes + diff with erections  I reviewed pt's medications, allergies, PMH, social hx, family hx,  and changes were documented in the history of present illness. Otherwise, unchanged from my initial visit note.   Objective:   Physical Exam BP 132/88 mmHg  Pulse 110  Temp(Src) 98.2 F (36.8 C) (Oral)  Ht 5\' 11"  (1.803 m)  Wt 244 lb (110.678 kg)  BMI 34.05 kg/m2  SpO2 95%   Wt Readings from Last 3 Encounters:  12/07/14 244 lb (110.678 kg)  12/06/14 248 lb 8 oz (112.719 kg)  06/30/14 241 lb 12 oz (109.657 kg)  Constitutional: obese, in NAD Eyes: PERRLA, EOMI, no exophthalmos ENT: moist mucous membranes, no thyromegaly, no cervical lymphadenopathy Cardiovascular: tachycardia, RR, No MRG Respiratory: CTA B Gastrointestinal: abdomen soft, NT,  ND, BS+ Musculoskeletal: no deformities, strength intact in all 4 Skin: moist, warm, no rashes Foot exam performed today : normal Assessment:     1. 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 He returns after a long absence, without a sugar log or meter. I again underlined the need to cut down pizza and regular drinks (which he started to do 1 mo ago >> was drinking up to 3x a day!!!). He may benefit from adding back a GLP1 R agonist to help with his appetite and to allow [place] to not increase his insulin doses - he will d/w his insurance -would prefer Trulicity. I advised him to:  Patient Instructions  - continue Metformin 1000 mg twice a day - continue Invokana 300 mg daily - continue NovoLog 10-12 units with a meal and 8 units with a snack Increase: - Lantus to 40 units at the beginning of our day and 30 units at bedtime  Please check with your insurance if they cover: - Levemir rather than Lantus  - Toujeo (3x more concentrated Lantus) - Trulicity (or Tanzeum) - once a week  Please stop at the lab.  - Will check an ACR today - RTC in 1.5 mo  Office Visit on 12/07/2014  Component Date Value Ref Range Status  . Microalb, Ur 12/07/2014 <0.7  0.0 - 1.9 mg/dL Final  . Creatinine,U 12/07/2014 67.1   Final  . Microalb Creat Ratio 12/07/2014 1.0  0.0 - 30.0 mg/g Final   Normal ACR.

## 2014-12-13 ENCOUNTER — Other Ambulatory Visit: Payer: Self-pay | Admitting: Internal Medicine

## 2014-12-13 ENCOUNTER — Encounter: Payer: Self-pay | Admitting: *Deleted

## 2014-12-19 ENCOUNTER — Other Ambulatory Visit: Payer: Self-pay | Admitting: Family Medicine

## 2014-12-27 ENCOUNTER — Other Ambulatory Visit: Payer: Self-pay | Admitting: Internal Medicine

## 2015-01-05 ENCOUNTER — Telehealth: Payer: Self-pay | Admitting: Internal Medicine

## 2015-01-07 NOTE — Telephone Encounter (Signed)
Please refill metfomin he is out thank you

## 2015-01-07 NOTE — Telephone Encounter (Signed)
Done

## 2015-01-18 ENCOUNTER — Ambulatory Visit (INDEPENDENT_AMBULATORY_CARE_PROVIDER_SITE_OTHER): Payer: 59 | Admitting: Internal Medicine

## 2015-01-18 ENCOUNTER — Encounter: Payer: Self-pay | Admitting: Internal Medicine

## 2015-01-18 VITALS — BP 124/80 | HR 92 | Temp 97.9°F | Resp 12 | Wt 245.8 lb

## 2015-01-18 DIAGNOSIS — E1165 Type 2 diabetes mellitus with hyperglycemia: Secondary | ICD-10-CM

## 2015-01-18 DIAGNOSIS — IMO0002 Reserved for concepts with insufficient information to code with codable children: Secondary | ICD-10-CM

## 2015-01-18 NOTE — Patient Instructions (Signed)
Please continue: - continue Metformin 1000 mg twice a day - continue Invokana 300 mg daily - Lantus to 40 units at the beginning of our day and 30 units at bedtime  Please increase: - continue NovoLog 12-14 units with a meal and 10-12 units with a snack  Please check with your insurance if they cover: - Levemir rather than Lantus  - Humalog rather than NovoLog - Toujeo (3x more concentrated Lantus) - Trulicity (or Tanzeum) - once a week

## 2015-01-18 NOTE — Progress Notes (Signed)
Subjective:     Patient ID: Mitchell Palmer, male   DOB: 1966/07/30, 49 y.o.   MRN: [id number]  Diabetes  Mitchell Palmer is a 49 y.o. man, returning for followup for DM2, dx in 2000, uncontrolled, insulin-dependent, with complications (CKD stage 3, erectile dysfunction, mild DR). Last visit 1.5 mo ago.  His last hemoglobin A1c was: Lab Results  Component Value Date   HGBA1C 8.9* 12/06/2014   HGBA1C 9.1* 03/01/2014   HGBA1C 8.9* 09/30/2013  He had steroid inj's in the past.  He is on a regimen of: - Metformin 1000 mg twice a day (also takes B12 vitamin po daily) - Lantus 40 units in am and 30 units in pm (vials) - Invokana 300 mg daily - NovoLog 8 (cookie)-10-12 units (larger meal) 3x a day  He was on: - Cycloset - started 04/22/2013 >> at 1.6 mg daily (tried 3 tabs >> dizziness >> backed off to 2) - Victoza 1.8 mg daily - Actos 45 mg daily - Januvia - glyburide/metformin - Avandia. - Bydureon 2 mg weekly - he hated the thick needle   He is checking his sugars 2-3 a day (he has a One Touch ultra meter). He brings his log today: - am: 140-160s >> 118-165 (181, 199) >> 180s >> 125, 130-174, 200s when works nights - 2 hours after breakfast: 200s >> 155-286 >> 183-220 >> 91-231 >> 180-220 >> 178-216 >> n/c - before lunch:  150s >> 132, one value >> 83-150 >> 83, 94 >> 112-140 >> 133 >> 180s >> 84-147, 250, 275 - 2 hours after lunch: 129, 216 >> 175-220 >> 123, 222, 259 >> n/c - Before dinner: 200s >> 200-282 >> 220-250 >> 191-294 >> 180s >> 196, 200 >> n/c >> 107, 121-250 - bedtime 280-312 >> 200s and had 1x 300 >> up to 300 (pizza) >> 180s >> n/c >> 125-158 (cut down on pizza) Lowest: 75 - in the middle of his night - . He has hypoglycemia awareness ~80. Highest 310.  He works nights: 6 pm - 7:30 am. Works 3 days a week.   Meals: - 5:30-6 pm (at work): 2 bread slices + meat - snack at 9-10 pm: pistachios or 1/2 sandwich - 4:30 am: coffee - 11 am-12 pm: salad, meat + veggies +  rice (leftovers from take out) He was drinking regular soda >> was drinking 1-3 a day >> cut down on these.  - Has HL (increased TG). Last lipid panel: Lab Results  Component Value Date   CHOL 147 12/06/2014   HDL 37.20* 12/06/2014   LDLCALC 62 09/30/2013   LDLDIRECT 79.0 12/06/2014   TRIG 225.0* 12/06/2014   CHOLHDL 4 12/06/2014  He is on Atorvastatin. - last eye exam was with Dr. Katy Fitch in 08/2014, mild retinopathy, L>R  - No numbness or tingling in his legs. Foot exam performed 11/2014: normal - He does have chronic kidney disease - stage 2, last BUN/Cr:  Lab Results  Component Value Date   BUN 19 12/06/2014   CREATININE 1.22 12/06/2014   Office Visit on 12/07/2014  Component Date Value Ref Range Status  . Microalb, Ur 12/07/2014 <0.7  0.0 - 1.9 mg/dL Final  . Creatinine,U 12/07/2014 67.1   Final  . Microalb Creat Ratio 12/07/2014 1.0  0.0 - 30.0 mg/g Final  He is on Olmestartan.  He also has a history of hyperlipidemia, hypertension, GERD, esophageal stricture, plantar fasciitis, eustachian tube dysfunction, status post ear surgery, history of increased alcohol use.  Review of Systems Constitutional: + weight gain, no fatigue, + increased appetite Eyes: no blurry vision, no xerophthalmia ENT: + sore throat, no nodules palpated in throat, no dysphagia/odynophagia, + hoarseness Cardiovascular: no CP/SOB/palpitations/leg swelling Respiratory: no cough/SOB Gastrointestinal: no N/V/D/C Musculoskeletal: + muscle aches/no joint aches/joint swelling Skin: no rashes + diff with erections  I reviewed pt's medications, allergies, PMH, social hx, family hx, and changes were documented in the history of present illness. Otherwise, unchanged from my initial visit note.   Objective:   Physical Exam BP 124/80 mmHg  Pulse 92  Temp(Src) 97.9 F (36.6 C) (Oral)  Resp 12  Wt 245 lb 12.8 oz (111.494 kg)  SpO2 98%   Wt Readings from Last 3 Encounters:  01/18/15 245 lb 12.8 oz  (111.494 kg)  12/07/14 244 lb (110.678 kg)  12/06/14 248 lb 8 oz (112.719 kg)  Constitutional: obese, in NAD Eyes: PERRLA, EOMI, no exophthalmos ENT: moist mucous membranes, no thyromegaly, no cervical lymphadenopathy Cardiovascular: tachycardia, RR, No MRG Respiratory: CTA B Gastrointestinal: abdomen soft, NT, ND, BS+ Musculoskeletal: no deformities, strength intact in all 4 Skin: moist, warm, no rashes  Assessment:     1. 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   He may benefit from adding back a GLP1 R agonist to help with his appetite and to allow [place] to not increase his insulin doses - he did not d/w his insurance yet about Levemir and Humalog rather than Lantus and NovoLog, as the latter 2 are tier3. I also advised him to check with his insurance whether we can use Toujeo instead of Lantus, since he is using such high doses of basal insulin. We gave him a coupon for this. Until then, will increase the NovoLog since he still has many sugars in the 200s. He cannot give me much information about why his sugars are high, but I suspect his diet is still poor. - For now, I advised him to: Patient Instructions  Please continue: - continue Metformin 1000 mg twice a day - continue Invokana 300 mg daily - Lantus 40 units at the beginning of our day and 30 units at bedtime  Please increase: - NovoLog 12-14 units with a meal and 10-12 units with a snack  Please check with your insurance if they cover: - Levemir rather than Lantus  - Humalog rather than NovoLog - Toujeo (3x more concentrated Lantus) - Trulicity (or Tanzeum) - once a week  Return in about 2 months (around 03/20/2015).

## 2015-01-19 ENCOUNTER — Telehealth: Payer: Self-pay | Admitting: Internal Medicine

## 2015-01-19 MED ORDER — INSULIN GLARGINE 100 UNIT/ML ~~LOC~~ SOLN: SUBCUTANEOUS | Status: DC

## 2015-01-19 MED ORDER — INSULIN ASPART 100 UNIT/ML FLEXPEN: 12.0000 [IU] | PEN_INJECTOR | Freq: Three times a day (TID) | SUBCUTANEOUS | Status: DC

## 2015-01-19 NOTE — Telephone Encounter (Signed)
Refills sent to pts pharmacy.

## 2015-01-19 NOTE — Telephone Encounter (Signed)
Pt needs refills on lantus and novalog ,call into [facility] at [place]

## 2015-03-13 ENCOUNTER — Other Ambulatory Visit: Payer: Self-pay | Admitting: Internal Medicine

## 2015-03-21 ENCOUNTER — Ambulatory Visit: Payer: 59 | Admitting: Internal Medicine

## 2015-03-29 ENCOUNTER — Ambulatory Visit: Payer: 59 | Admitting: Internal Medicine

## 2015-03-29 ENCOUNTER — Ambulatory Visit: Payer: 59 | Admitting: Family Medicine

## 2015-03-30 ENCOUNTER — Encounter: Payer: Self-pay | Admitting: Family Medicine

## 2015-03-30 ENCOUNTER — Encounter: Payer: Self-pay | Admitting: *Deleted

## 2015-03-30 ENCOUNTER — Ambulatory Visit (INDEPENDENT_AMBULATORY_CARE_PROVIDER_SITE_OTHER): Payer: 59 | Admitting: Family Medicine

## 2015-03-30 VITALS — BP 114/62 | HR 87 | Temp 97.7°F | Wt 248.5 lb

## 2015-03-30 DIAGNOSIS — E1165 Type 2 diabetes mellitus with hyperglycemia: Secondary | ICD-10-CM

## 2015-03-30 DIAGNOSIS — M545 Low back pain, unspecified: Secondary | ICD-10-CM

## 2015-03-30 DIAGNOSIS — IMO0002 Reserved for concepts with insufficient information to code with codable children: Secondary | ICD-10-CM

## 2015-03-30 MED ORDER — DICLOFENAC SODIUM 75 MG PO TBEC: 75.0000 mg | DELAYED_RELEASE_TABLET | Freq: Two times a day (BID) | ORAL | Status: DC

## 2015-03-30 MED ORDER — CYCLOBENZAPRINE HCL 10 MG PO TABS: 10.0000 mg | ORAL_TABLET | Freq: Three times a day (TID) | ORAL | Status: DC | PRN

## 2015-03-30 MED ORDER — HYDROCODONE-ACETAMINOPHEN 5-325 MG PO TABS: 1.0000 | ORAL_TABLET | Freq: Four times a day (QID) | ORAL | Status: DC | PRN

## 2015-03-30 NOTE — Progress Notes (Signed)
Pre visit review using our clinic review tool, if applicable. No additional management support is needed unless otherwise documented below in the visit note. 

## 2015-03-30 NOTE — Progress Notes (Signed)
Dr. Frederico Hamman T. Ruari Duggan, MD, [facility] [place], [address] Phone: [phone] Fax: [phone]  03/30/2015  Patient: Mitchell Palmer, MRN: [id number], DOB: 12/01/1965, 49 y.o.  Primary Physician:  Loura Pardon, MD  Chief Complaint: Back Pain  Subjective:   Mitchell Palmer is a 49 y.o. very pleasant male patient who presents with the following: Back Pain  ongoing for approximately: 5 days The patient has had back pain before. The back pain is localized into the lumbar spine area. They also describe no radiculopathy.  R sided low back pain.  Felt some pain when got out of car and had some bad pain.  No numbness, tingling.  Some pain with getting up and trying to get up.   DOI 03/25/2015.   Took some muscle relaxers Doing heating pad.  Prn vicodin.   Lab Results  Component Value Date   HGBA1C 8.9* 12/06/2014    No numbness or tingling. No bowel or bladder incontinence. No focal weakness. Prior interventions: none Recent: Physical therapy: No Chiropractic manipulations: No Acupuncture: No Osteopathic manipulation: No Heat or cold: Minimal effect  Past Medical History, Surgical History, Family History, Medications, Allergies have been reviewed and updated if relevant.  Patient Active Problem List   Diagnosis Date Noted  . Alcohol consumption heavy 12/06/2014  . Viral URI with cough 12/06/2014  . Swelling, mass, or lump in head and neck 06/30/2014  . Necrobiosis diabeticorum 03/01/2014  . Routine general medical examination at a health care facility 09/30/2013  . Low back pain 06/17/2012  . Abnormal chest x-ray 10/15/2011  . Right shoulder pain 10/12/2011  . PLANTAR FASCIITIS, TRAUMATIC 11/09/2009  . NEOPLASM UNCERTAIN BEHAVIOR OTHER SPEC SITES 10/07/2009  . ESOPHAGEAL STRICTURE 06/27/2009  . GERD 03/31/2009  . OTHER DYSPHAGIA 03/31/2009  . DM (diabetes mellitus), type 2, uncontrolled 01/06/2009  .  Hyperlipidemia 01/06/2009  . Essential hypertension 01/06/2009  . ALLERGIC RHINITIS 01/06/2009    Past Medical History  Diagnosis Date  . Allergy     allegic rhinitis  . Diabetes mellitus     type II  . Hyperlipidemia   . Hypertension   . GERD (gastroesophageal reflux disease)   . Hyperplastic polyp of intestine 2010  . Esophageal stricture   . History of hernia repair     as a baby  . S/P ear surgery, follow-up exam     for drainage    Past Surgical History  Procedure Laterality Date  . Esophagogastroduodenoscopy  04/2009    stricture/GERD  . Hernia repair      age 75-midline  . Inner ear surgery  1988    went in behind rt ear-blockage  . Shoulder arthroscopy with rotator cuff repair and subacromial decompression Right 09/29/2012    Procedure: SHOULDER ARTHROSCOPY WITH ROTATOR CUFF REPAIR AND SUBACROMIAL DECOMPRESSION;  Surgeon: Nita Sells, MD;  Location: [facility];  Service: Orthopedics;  Laterality: Right;  Right shoulder arthroscopy with subacromial decompression and distal clavicle excision & Debridement of Labrial Tear    Social History   Social History  . Marital Status: Single    Spouse Name: N/A  . Number of Children: N/A  . Years of Education: N/A   Occupational History  . Not on file.   Social History Main Topics  . Smoking status: Never Smoker   . Smokeless tobacco: Never Used  . Alcohol Use: 0.0 oz/week    0 Standard drinks or equivalent per week  Comment: 6 pk every 3 days; 12 pk/week  . Drug Use: No  . Sexual Activity: Not on file   Other Topics Concern  . Not on file   Social History Narrative    Family History  Problem Relation Age of Onset  . Diabetes Mother   . Cancer Father 29    colon cancer  . Diabetes Maternal Aunt   . Cancer Cousin     breast cancer  . Diabetes Other     Allergies  Allergen Reactions  . Codeine     REACTION: anxious    Medication list reviewed and updated in full in [facility].  GEN: No fevers, chills. Nontoxic. Primarily MSK c/o today. MSK: Detailed in the HPI GI: tolerating PO intake without difficulty Neuro: As above  Otherwise the pertinent positives of the ROS are noted above.    Objective:   Blood pressure 114/62, pulse 87, temperature 97.7 F (36.5 C), temperature source Oral, weight 248 lb 8 oz (112.719 kg), SpO2 97 %.  Gen: Well-developed,well-nourished,in no acute distress; alert,appropriate and cooperative throughout examination HEENT: Normocephalic and atraumatic without obvious abnormalities.  Ears, externally no deformities Pulm: Breathing comfortably in no respiratory distress Range of motion at  the waist: Flexion, rotation and lateral bending: relatively preserved extension.  Flexion is limited to 70.  Lateral bending is minorly limited.  No echymosis or edema Rises to examination table with no difficulty Gait: minimally antalgic  Inspection/Deformity: No abnormality Paraspinus T:  Tender throughout from L2-S1.  B Ankle Dorsiflexion (L5,4): 5/5 B Great Toe Dorsiflexion (L5,4): 5/5 Heel Walk (L5): WNL Toe Walk (S1): WNL Rise/Squat (L4): WNL, mild pain  SENSORY B Medial Foot (L4): WNL B Dorsum (L5): WNL B Lateral (S1): WNL Light Touch: WNL Pinprick: WNL  REFLEXES Knee (L4): 2+ Ankle (S1): 2+  B SLR, seated: neg B SLR, supine: neg B FABER: neg B Reverse FABER: neg B Greater Troch: NT B Log Roll: neg B Stork: NT B Sciatic Notch: NT  Radiology: No results found.  Assessment and Plan:   Right-sided low back pain without sciatica  DM (diabetes mellitus), type 2, uncontrolled  Anatomy reviewed. Conservative algorithms for acute back pain generally begin with the following: NSAIDS, Muscle Relaxants, Mild pain medication  Voltaren, Flexeril  Start with medications, core rehab, and progress from there following low back pain algorithm. No red flags are present.  Follow-up: if no improvement  New  Prescriptions   HYDROCODONE-ACETAMINOPHEN (NORCO/VICODIN) 5-325 MG PER TABLET    Take 1 tablet by mouth every 6 (six) hours as needed for moderate pain.   No orders of the defined types were placed in this encounter.    Signed,  Maud Deed. Michalla Ringer, MD   Patient's Medications  New Prescriptions   HYDROCODONE-ACETAMINOPHEN (NORCO/VICODIN) 5-325 MG PER TABLET    Take 1 tablet by mouth every 6 (six) hours as needed for moderate pain.  Previous Medications   ATORVASTATIN (LIPITOR) 40 MG TABLET    TAKE 1 TABLET BY MOUTH EVERY DAY   B-D INS SYR ULTRAFINE 1CC/31G 31G X 5/16" 1 ML MISC    INJECT 35 UNITS INTO THE SKIN EVERY 12 HOURS AS DIRECTED BY PHYSICIAN.   B-D UF III MINI PEN NEEDLES 31G X 5 MM MISC    USE 4X A DAY   BENICAR 40 MG TABLET    TAKE 1 TABLET BY MOUTH EVERY DAY   FLUTICASONE (FLONASE) 50 MCG/ACT NASAL SPRAY    USE 2 SPRAYS IN [facility]  NOSTRIL ONCE A DAY   GLUCOSE BLOOD (ONE TOUCH ULTRA TEST) TEST STRIP    TEST BLOOD SUGAR 4 TIMES A DAY AS DIRECTED   HYDROCODONE-ACETAMINOPHEN (NORCO/VICODIN) 5-325 MG PER TABLET    Take 1 tablet by mouth every 6 (six) hours as needed for moderate pain.   INSULIN ASPART (NOVOLOG) 100 UNIT/ML FLEXPEN    Inject 12-14 Units into the skin 3 (three) times daily with meals. And 10-12 units with a snack.   INSULIN GLARGINE (LANTUS) 100 UNIT/ML INJECTION    Inject 40 units under skin at the beginning of your day and 30 units at bedtime   INVOKANA 300 MG TABS    TAKE 1 TABLET (300 MG TOTAL) BY MOUTH DAILY BEFORE BREAKFAST.   INVOKANA 300 MG TABS TABLET    TAKE 1 TABLET (300 MG TOTAL) BY MOUTH DAILY BEFORE BREAKFAST.   METFORMIN (GLUCOPHAGE) 1000 MG TABLET    TAKE 1 TABLET (1,000 MG TOTAL) BY MOUTH 2 (TWO) TIMES DAILY WITH A MEAL.   OMEPRAZOLE (PRILOSEC) 40 MG CAPSULE    Take 1 capsule (40 mg total) by mouth daily.   ONE TOUCH ULTRA TEST TEST STRIP    TEST BLOOD SUGAR 2 TIMES A DAY AS DIRECTED   TADALAFIL (CIALIS) 20 MG TABLET    Take 1 tablet (20 mg total) by  mouth as directed.   VITAMIN B-12 (CYANOCOBALAMIN) 1000 MCG TABLET    Take 1,000 mcg by mouth daily.    Modified Medications   Modified Medication Previous Medication   CYCLOBENZAPRINE (FLEXERIL) 10 MG TABLET cyclobenzaprine (FLEXERIL) 10 MG tablet      Take 1 tablet (10 mg total) by mouth 3 (three) times daily as needed for muscle spasms. For low back pain    Take 1 tablet (10 mg total) by mouth 3 (three) times daily as needed for muscle spasms. For low back pain   DICLOFENAC (VOLTAREN) 75 MG EC TABLET diclofenac (VOLTAREN) 75 MG EC tablet      Take 1 tablet (75 mg total) by mouth 2 (two) times daily.    Take 1 tablet (75 mg total) by mouth 2 (two) times daily.  Discontinued Medications   No medications on file

## 2015-04-08 ENCOUNTER — Other Ambulatory Visit: Payer: Self-pay | Admitting: Internal Medicine

## 2015-05-03 ENCOUNTER — Encounter: Payer: Self-pay | Admitting: Gastroenterology

## 2015-05-15 ENCOUNTER — Other Ambulatory Visit: Payer: Self-pay | Admitting: Family Medicine

## 2015-06-05 ENCOUNTER — Other Ambulatory Visit: Payer: Self-pay | Admitting: Internal Medicine

## 2015-06-08 ENCOUNTER — Other Ambulatory Visit: Payer: Self-pay | Admitting: Internal Medicine

## 2015-06-12 ENCOUNTER — Other Ambulatory Visit: Payer: Self-pay | Admitting: Internal Medicine

## 2015-07-05 ENCOUNTER — Other Ambulatory Visit: Payer: Self-pay | Admitting: Internal Medicine

## 2015-07-21 ENCOUNTER — Telehealth: Payer: Self-pay | Admitting: *Deleted

## 2015-07-21 NOTE — Telephone Encounter (Signed)
Flu shot f/u call: left voicemail requesting pt to call back to see status of flu vaccine

## 2015-07-22 ENCOUNTER — Ambulatory Visit (INDEPENDENT_AMBULATORY_CARE_PROVIDER_SITE_OTHER): Payer: 59

## 2015-07-22 ENCOUNTER — Other Ambulatory Visit (INDEPENDENT_AMBULATORY_CARE_PROVIDER_SITE_OTHER): Payer: 59

## 2015-07-22 DIAGNOSIS — Z23 Encounter for immunization: Secondary | ICD-10-CM | POA: Diagnosis not present

## 2015-07-22 DIAGNOSIS — E785 Hyperlipidemia, unspecified: Secondary | ICD-10-CM | POA: Diagnosis not present

## 2015-07-22 DIAGNOSIS — E118 Type 2 diabetes mellitus with unspecified complications: Secondary | ICD-10-CM

## 2015-07-22 DIAGNOSIS — E1165 Type 2 diabetes mellitus with hyperglycemia: Secondary | ICD-10-CM

## 2015-07-22 LAB — LIPID PANEL
CHOLESTEROL: 142 mg/dL (ref 0–200)
HDL: 34.7 mg/dL — AB (ref >39.00)
NonHDL: 107.35
Total CHOL/HDL Ratio: 4
Triglycerides: 203 mg/dL — ABNORMAL HIGH (ref 0.0–149.0)
VLDL: 40.6 mg/dL — AB (ref 0.0–40.0)

## 2015-07-22 LAB — LDL CHOLESTEROL, DIRECT: Direct LDL: 72 mg/dL

## 2015-07-22 LAB — HEMOGLOBIN A1C: HEMOGLOBIN A1C: 8 % — AB (ref 4.6–6.5)

## 2015-07-22 NOTE — Addendum Note (Signed)
Addended by: Ellamae Sia on: 07/22/2015 02:43 PM   Modules accepted: Orders

## 2015-07-22 NOTE — Telephone Encounter (Signed)
Flu shot appt scheduled

## 2015-07-27 ENCOUNTER — Encounter: Payer: Self-pay | Admitting: Family Medicine

## 2015-07-27 ENCOUNTER — Ambulatory Visit (INDEPENDENT_AMBULATORY_CARE_PROVIDER_SITE_OTHER): Payer: 59 | Admitting: Family Medicine

## 2015-07-27 VITALS — BP 128/68 | HR 108 | Temp 98.2°F | Ht 71.0 in | Wt 250.0 lb

## 2015-07-27 DIAGNOSIS — N183 Chronic kidney disease, stage 3 (moderate): Secondary | ICD-10-CM

## 2015-07-27 DIAGNOSIS — E1165 Type 2 diabetes mellitus with hyperglycemia: Secondary | ICD-10-CM

## 2015-07-27 DIAGNOSIS — Z794 Long term (current) use of insulin: Secondary | ICD-10-CM

## 2015-07-27 DIAGNOSIS — IMO0002 Reserved for concepts with insufficient information to code with codable children: Secondary | ICD-10-CM

## 2015-07-27 DIAGNOSIS — E785 Hyperlipidemia, unspecified: Secondary | ICD-10-CM

## 2015-07-27 DIAGNOSIS — I10 Essential (primary) hypertension: Secondary | ICD-10-CM

## 2015-07-27 DIAGNOSIS — F1010 Alcohol abuse, uncomplicated: Secondary | ICD-10-CM

## 2015-07-27 DIAGNOSIS — E1122 Type 2 diabetes mellitus with diabetic chronic kidney disease: Secondary | ICD-10-CM

## 2015-07-27 DIAGNOSIS — Z789 Other specified health status: Secondary | ICD-10-CM

## 2015-07-27 MED ORDER — ATORVASTATIN CALCIUM 40 MG PO TABS: 40.0000 mg | ORAL_TABLET | Freq: Every day | ORAL | Status: DC

## 2015-07-27 NOTE — Patient Instructions (Addendum)
Follow up with Dr Cruzita Lederer  Work on diabetic diet the best you can  Anything you can do to work on abdominal strength and loose weight will help back pain  If you check out that weight loss program - see if it is safe for you  Try to cut down alcohol (or quit entirely)  Cholesterol is stable - exercise would help increase your HDL (good number)

## 2015-07-27 NOTE — Progress Notes (Signed)
Subjective:    Patient ID: Mitchell Palmer, male    DOB: 1965-12-18, 49 y.o.   MRN: [id number]  HPI Here for f/u of chronic medical problems  Feeling ok  Less exercise tolerance lately with his weight loss    bp is stable today  No cp or palpitations or headaches or edema  No side effects to medicines  BP Readings from Last 3 Encounters:  07/27/15 128/68  03/30/15 114/62  01/18/15 124/80     Wt is up 2 lb with bmi of 34 Is interested in a wt loss program he heard about on the radio   No time for exercise - he cannot fit it in - working really long hours /only has time for sleep and work but nothing else  Is trying not to overeat however - did well over Thanksgiving  Eats fair- but not a diabetic diet  Confesses "I don't eat that much"  Wishes he could get on a day shift in the future   Hyperlipidemia (in diabetic) Lab Results  Component Value Date   CHOL 142 07/22/2015   CHOL 147 12/06/2014   CHOL 144 09/30/2013   Lab Results  Component Value Date   HDL 34.70* 07/22/2015   HDL 37.20* 12/06/2014   HDL 49.20 09/30/2013   Lab Results  Component Value Date   LDLCALC 62 09/30/2013   LDLCALC 67 07/08/2009   Lab Results  Component Value Date   TRIG 203.0* 07/22/2015   TRIG 225.0* 12/06/2014   TRIG 162.0* 09/30/2013   Lab Results  Component Value Date   CHOLHDL 4 07/22/2015   CHOLHDL 4 12/06/2014   CHOLHDL 3 09/30/2013   Lab Results  Component Value Date   LDLDIRECT 72.0 07/22/2015   LDLDIRECT 79.0 12/06/2014   LDLDIRECT 65.7 01/28/2013   on lipitor and diet  Trig are down a bit with better glucose    Sees endocrinology for his DM Lab Results  Component Value Date   HGBA1C 8.0* 07/22/2015  down almost a point  Improved staying away from sweets   Alcohol intake  Still drinks 1.5 pt of beer per day or a case in 3 days -sometimes a case in 3 days  No cravings  Does not think he has alcohol dependence   occ he has back pain - low / tenses up    Patient Active Problem List   Diagnosis Date Noted  . Alcohol consumption heavy 12/06/2014  . Viral URI with cough 12/06/2014  . Swelling, mass, or lump in head and neck 06/30/2014  . Necrobiosis diabeticorum ([place]) 03/01/2014  . Routine general medical examination at a health care facility 09/30/2013  . Low back pain 06/17/2012  . Abnormal chest x-ray 10/15/2011  . Right shoulder pain 10/12/2011  . PLANTAR FASCIITIS, TRAUMATIC 11/09/2009  . NEOPLASM UNCERTAIN BEHAVIOR OTHER SPEC SITES 10/07/2009  . ESOPHAGEAL STRICTURE 06/27/2009  . GERD 03/31/2009  . OTHER DYSPHAGIA 03/31/2009  . DM (diabetes mellitus), type 2, uncontrolled ([place]) 01/06/2009  . Hyperlipidemia 01/06/2009  . Essential hypertension 01/06/2009  . ALLERGIC RHINITIS 01/06/2009   Past Medical History  Diagnosis Date  . Allergy     allegic rhinitis  . Diabetes mellitus     type II  . Hyperlipidemia   . Hypertension   . GERD (gastroesophageal reflux disease)   . Hyperplastic polyp of intestine 2010  . Esophageal stricture   . History of hernia repair     as a baby  . S/P ear surgery, follow-up exam  for drainage   Past Surgical History  Procedure Laterality Date  . Esophagogastroduodenoscopy  04/2009    stricture/GERD  . Hernia repair      age 36-midline  . Inner ear surgery  1988    went in behind rt ear-blockage  . Shoulder arthroscopy with rotator cuff repair and subacromial decompression Right 09/29/2012    Procedure: SHOULDER ARTHROSCOPY WITH ROTATOR CUFF REPAIR AND SUBACROMIAL DECOMPRESSION;  Surgeon: Nita Sells, MD;  Location: [facility];  Service: Orthopedics;  Laterality: Right;  Right shoulder arthroscopy with subacromial decompression and distal clavicle excision & Debridement of Labrial Tear   Social History  Substance Use Topics  . Smoking status: Never Smoker   . Smokeless tobacco: Never Used  . Alcohol Use: 0.0 oz/week    0 Standard drinks or equivalent per  week     Comment: 6 pk every 3 days; 12 pk/week   Family History  Problem Relation Age of Onset  . Diabetes Mother   . Cancer Father 68    colon cancer  . Diabetes Maternal Aunt   . Cancer Cousin     breast cancer  . Diabetes Other    Allergies  Allergen Reactions  . Codeine     REACTION: anxious   Current Outpatient Prescriptions on File Prior to Visit  Medication Sig Dispense Refill  . atorvastatin (LIPITOR) 40 MG tablet TAKE 1 TABLET BY MOUTH EVERY DAY 30 tablet 5  . B-D INS SYR ULTRAFINE 1CC/31G 31G X 5/16" 1 ML MISC INJECT 35 UNITS INTO THE SKIN EVERY 12 HOURS AS DIRECTED BY PHYSICIAN. 200 each 2  . B-D UF III MINI PEN NEEDLES 31G X 5 MM MISC USE 4X A DAY 300 each 5  . BENICAR 40 MG tablet TAKE 1 TABLET BY MOUTH EVERY DAY 30 tablet 11  . cyclobenzaprine (FLEXERIL) 10 MG tablet Take 1 tablet (10 mg total) by mouth 3 (three) times daily as needed for muscle spasms. For low back pain 40 tablet 1  . diclofenac (VOLTAREN) 75 MG EC tablet Take 1 tablet (75 mg total) by mouth 2 (two) times daily. 60 tablet 3  . fluticasone (FLONASE) 50 MCG/ACT nasal spray USE 2 SPRAYS IN EACH NOSTRIL ONCE A DAY 16 g 3  . glucose blood (ONE TOUCH ULTRA TEST) test strip TEST BLOOD SUGAR 4 TIMES A DAY AS DIRECTED 200 each 11  . HYDROcodone-acetaminophen (NORCO/VICODIN) 5-325 MG per tablet Take 1 tablet by mouth every 6 (six) hours as needed for moderate pain. 40 tablet 0  . insulin glargine (LANTUS) 100 UNIT/ML injection Inject 40 units under skin at the beginning of your day and 30 units at bedtime 60 mL 2  . INVOKANA 300 MG TABS TAKE 1 TABLET (300 MG TOTAL) BY MOUTH DAILY BEFORE BREAKFAST. 30 tablet 2  . metFORMIN (GLUCOPHAGE) 1000 MG tablet TAKE 1 TABLET (1,000 MG TOTAL) BY MOUTH 2 (TWO) TIMES DAILY WITH A MEAL. 60 tablet 0  . NOVOLOG FLEXPEN 100 UNIT/ML FlexPen INJECT 12-14 UNITS INTO THE SKIN 3 (THREE) TIMES DAILY WITH MEALS. AND 10-12 UNITS WITH A SNACK. 15 pen 1  . omeprazole (PRILOSEC) 40 MG  capsule Take 1 capsule (40 mg total) by mouth daily. 30 capsule 11  . ONE TOUCH ULTRA TEST test strip TESTR BLOOD SUGAR TWICE A DAY 100 each 1  . tadalafil (CIALIS) 20 MG tablet Take 1 tablet (20 mg total) by mouth as directed. 10 tablet 11  . vitamin B-12 (CYANOCOBALAMIN) 1000 MCG tablet  Take 1,000 mcg by mouth daily.       No current facility-administered medications on file prior to visit.      Review of Systems     Objective:   Physical Exam        Assessment & Plan:

## 2015-07-27 NOTE — Progress Notes (Signed)
Pre visit review using our clinic review tool, if applicable. No additional management support is needed unless otherwise documented below in the visit note. 

## 2015-07-28 ENCOUNTER — Ambulatory Visit (INDEPENDENT_AMBULATORY_CARE_PROVIDER_SITE_OTHER): Payer: 59 | Admitting: Internal Medicine

## 2015-07-28 ENCOUNTER — Encounter: Payer: Self-pay | Admitting: Internal Medicine

## 2015-07-28 ENCOUNTER — Encounter: Payer: Self-pay | Admitting: Family Medicine

## 2015-07-28 VITALS — BP 112/70 | HR 95 | Temp 98.2°F | Resp 12 | Wt 249.6 lb

## 2015-07-28 DIAGNOSIS — N183 Chronic kidney disease, stage 3 (moderate): Secondary | ICD-10-CM | POA: Diagnosis not present

## 2015-07-28 DIAGNOSIS — E1122 Type 2 diabetes mellitus with diabetic chronic kidney disease: Secondary | ICD-10-CM

## 2015-07-28 DIAGNOSIS — E1165 Type 2 diabetes mellitus with hyperglycemia: Secondary | ICD-10-CM | POA: Diagnosis not present

## 2015-07-28 DIAGNOSIS — IMO0002 Reserved for concepts with insufficient information to code with codable children: Secondary | ICD-10-CM | POA: Insufficient documentation

## 2015-07-28 DIAGNOSIS — Z794 Long term (current) use of insulin: Secondary | ICD-10-CM

## 2015-07-28 DIAGNOSIS — E1139 Type 2 diabetes mellitus with other diabetic ophthalmic complication: Secondary | ICD-10-CM | POA: Insufficient documentation

## 2015-07-28 MED ORDER — INSULIN GLARGINE 100 UNIT/ML ~~LOC~~ SOLN: SUBCUTANEOUS | Status: DC

## 2015-07-28 MED ORDER — GLUCOSE BLOOD VI STRP: ORAL_STRIP | Status: DC

## 2015-07-28 NOTE — Progress Notes (Signed)
Subjective:     Patient ID: Mitchell Palmer, male   DOB: 1966/05/28, 49 y.o.   MRN: [id number]  Diabetes  Mr. Mitchell Palmer is a 49 y.o. man, returning for followup for DM2, dx in 2000, uncontrolled, insulin-dependent, with complications (CKD stage 3, erectile dysfunction, mild DR). Last visit 5.5 mo ago.  His last hemoglobin A1c was: Lab Results  Component Value Date   HGBA1C 8.0* 07/22/2015   HGBA1C 8.9* 12/06/2014   HGBA1C 9.1* 03/01/2014  He had steroid inj's in the past.  He is on a regimen of: - Metformin 1000 mg twice a day (also takes B12 vitamin po daily) - Invokana 300 mg daily - Lantus 40 units in am and 30 units in pm (vials) - NovoLog 08-03-19 (fried or baked potato) units with a meal and 10-12 units with a snack  He was on: - Cycloset - started 04/22/2013 >> at 1.6 mg daily (tried 3 tabs >> dizziness >> backed off to 2) - Victoza 1.8 mg daily - Actos 45 mg daily - Januvia - glyburide/metformin - Avandia. - Bydureon 2 mg weekly - he hated the thick needle   He is checking his sugars 2-3 a day (he has a One Touch ultra meter). He brings his log today: - am: 140-160s >> 118-165 (181, 199) >> 180s >> 125, 130-174, 200s when works nights >> 100-162, 186, 267 - 2 hours after breakfast: 200s >> 155-286 >> 183-220 >> 91-231 >> 180-220 >> 178-216 >> n/c >>114-194, 202 - before lunch: 132, one value >> 83-150 >> 83, 94 >> 112-140 >> 133 >> 180s >> 84-147, 250, 275 >> 73-120, 162 - 2 hours after lunch: 129, 216 >> 175-220 >> 123, 222, 259 >> n/c >> 147-180, 288 - Before dinner: 200s >> 200-282 >> 220-250 >> 191-294 >> 180s >> 196, 200 >> n/c >> 107, 121-250 >> 106-190, 233 - bedtime 280-312 >> 200s and had 1x 300 >> up to 300 (pizza) >> 180s >> n/c >> 125-158 (cut down on pizza) >> 175-250 Lowest: 75. He has hypoglycemia awareness ~80. Highest 310 >> 288  He works nights: 6 pm - 7:30 am. Works 3 days a week.   Meals: - 5:30-6 pm (at work): 2 bread slices + meat - snack at 9-10  pm: pistachios or 1/2 sandwich - 4:30 am: coffee - 11 am-12 pm: salad, meat + veggies + rice (leftovers from take out) He was drinking regular soda >> was drinking 1-3 a day >> cut down on these.  - Has HL (increased TG). Last lipid panel: Lab Results  Component Value Date   CHOL 142 07/22/2015   HDL 34.70* 07/22/2015   LDLCALC 62 09/30/2013   LDLDIRECT 72.0 07/22/2015   TRIG 203.0* 07/22/2015   CHOLHDL 4 07/22/2015  He is on Atorvastatin. - last eye exam was with Dr. Katy Fitch in 08/2014, mild retinopathy, L>R  - No numbness or tingling in his legs. Foot exam performed 11/2014: normal - He does have chronic kidney disease - stage 2, last BUN/Cr:  Lab Results  Component Value Date   BUN 19 12/06/2014   CREATININE 1.22 12/06/2014   Office Visit on 12/07/2014  Component Date Value Ref Range Status  . Microalb, Ur 12/07/2014 <0.7  0.0 - 1.9 mg/dL Final  . Creatinine,U 12/07/2014 67.1   Final  . Microalb Creat Ratio 12/07/2014 1.0  0.0 - 30.0 mg/g Final  He is on Olmestartan.  He also has a history of hyperlipidemia, hypertension, GERD, esophageal stricture, plantar fasciitis,  eustachian tube dysfunction, status post ear surgery, history of increased alcohol use.   Review of Systems Constitutional: no weight gain, no fatigue Eyes: no blurry vision, no xerophthalmia ENT: no sore throat, no nodules palpated in throat, no dysphagia/odynophagia, + hoarseness Cardiovascular: no CP/SOB/palpitations/leg swelling Respiratory: no cough/SOB Gastrointestinal: no N/V/D/C Musculoskeletal: no muscle aches/no joint aches/joint swelling Skin: no rashes  I reviewed pt's medications, allergies, PMH, social hx, family hx, and changes were documented in the history of present illness. Otherwise, unchanged from my initial visit note.   Objective:   Physical Exam BP 112/70 mmHg  Pulse 95  Temp(Src) 98.2 F (36.8 C) (Oral)  Resp 12  Wt 249 lb 9.6 oz (113.218 kg)  SpO2 95%   Wt Readings from  Last 3 Encounters:  07/28/15 249 lb 9.6 oz (113.218 kg)  07/27/15 250 lb (113.399 kg)  03/30/15 248 lb 8 oz (112.719 kg)  Constitutional: obese, in NAD Eyes: PERRLA, EOMI, no exophthalmos ENT: moist mucous membranes, no thyromegaly, no cervical lymphadenopathy Cardiovascular: tachycardia, RR, No MRG Respiratory: CTA B Gastrointestinal: abdomen soft, NT, ND, BS+ Musculoskeletal: no deformities, strength intact in all 4 Skin: moist, warm, no rashes  Assessment:     1. 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  Sugars are a little better >> will decrease the Lantus as he has some lows if he delays a meal. Will move Invokana at the beginning of his day (works nights). - For now, I advised him to: Patient Instructions  Please continue: - Metformin 1000 mg 2x a day - Invokana 300 mg daily >> move at the beginning of your day  Please decrease: - Lantus to 30 units in am and 30 units in pm  Please increase: - NovoLog:  10 units with a snack 15 units for a smaller meal 20 units for a regular meal  25 units for a large meal  Please return in 3 months with your sugar log.   - UTD with flu shot and eye exam - reviewed last hba1c >> 8.0 % (better) - refilled strips - RTC in 3 mo

## 2015-07-28 NOTE — Assessment & Plan Note (Signed)
Disc goals for lipids and reasons to control them Rev labs with pt Rev low sat fat diet in detail Trig are improved with improved glucose control -continue to watch Continue statin and diet

## 2015-07-28 NOTE — Patient Instructions (Addendum)
Please continue: - Metformin 1000 mg 2x a day - Invokana 300 mg daily >> move at the beginning of your day  Please decrease: - Lantus to 30 units in am and 30 units in pm  Please increase: - NovoLog:  10 units with a snack 15 units for a smaller meal 20 units for a regular meal  25 units for a large meal  Please return in 3 months with your sugar log.

## 2015-07-28 NOTE — Assessment & Plan Note (Signed)
Improved A1C  Lab Results  Component Value Date   HGBA1C 8.0* 07/22/2015   Struggles with shift work and schedule For f/u with his endocrinologist this month

## 2015-07-28 NOTE — Assessment & Plan Note (Signed)
bp in fair control at this time  BP Readings from Last 1 Encounters:  07/28/15 112/70   No changes needed Disc lifstyle change with low sodium diet and exercise  Pulse was better on 2nd check at 92 Labs reviewed

## 2015-07-28 NOTE — Assessment & Plan Note (Signed)
Pt has cut back  Disc goal of less than 2 drinks per day for liver and overall health - but optimally for DM- quitting Pt denies symptoms of dependence but not sure he wants to quit Rev carb content of alcohol  Will continue to monitor

## 2015-08-02 ENCOUNTER — Other Ambulatory Visit: Payer: Self-pay | Admitting: Internal Medicine

## 2015-08-08 ENCOUNTER — Other Ambulatory Visit: Payer: Self-pay | Admitting: Internal Medicine

## 2015-08-10 ENCOUNTER — Other Ambulatory Visit: Payer: Self-pay | Admitting: Internal Medicine

## 2015-09-13 ENCOUNTER — Other Ambulatory Visit: Payer: Self-pay | Admitting: Family Medicine

## 2015-09-18 ENCOUNTER — Other Ambulatory Visit: Payer: Self-pay | Admitting: Internal Medicine

## 2015-10-10 ENCOUNTER — Other Ambulatory Visit: Payer: Self-pay | Admitting: Internal Medicine

## 2015-10-11 ENCOUNTER — Other Ambulatory Visit: Payer: Self-pay | Admitting: Internal Medicine

## 2015-10-20 LAB — HM DIABETES EYE EXAM

## 2015-10-27 ENCOUNTER — Ambulatory Visit: Payer: 59 | Admitting: Internal Medicine

## 2015-10-29 ENCOUNTER — Other Ambulatory Visit: Payer: Self-pay | Admitting: Family Medicine

## 2015-11-02 ENCOUNTER — Other Ambulatory Visit: Payer: Self-pay | Admitting: Internal Medicine

## 2015-11-07 ENCOUNTER — Encounter: Payer: Self-pay | Admitting: Gastroenterology

## 2015-11-08 ENCOUNTER — Other Ambulatory Visit (INDEPENDENT_AMBULATORY_CARE_PROVIDER_SITE_OTHER): Payer: 59 | Admitting: *Deleted

## 2015-11-08 ENCOUNTER — Encounter: Payer: Self-pay | Admitting: Internal Medicine

## 2015-11-08 ENCOUNTER — Ambulatory Visit (INDEPENDENT_AMBULATORY_CARE_PROVIDER_SITE_OTHER): Payer: 59 | Admitting: Internal Medicine

## 2015-11-08 VITALS — BP 120/68 | HR 104 | Temp 98.5°F | Resp 12 | Wt 249.0 lb

## 2015-11-08 DIAGNOSIS — N183 Chronic kidney disease, stage 3 (moderate): Secondary | ICD-10-CM | POA: Diagnosis not present

## 2015-11-08 DIAGNOSIS — Z794 Long term (current) use of insulin: Secondary | ICD-10-CM | POA: Diagnosis not present

## 2015-11-08 DIAGNOSIS — E1122 Type 2 diabetes mellitus with diabetic chronic kidney disease: Secondary | ICD-10-CM

## 2015-11-08 DIAGNOSIS — IMO0002 Reserved for concepts with insufficient information to code with codable children: Secondary | ICD-10-CM

## 2015-11-08 DIAGNOSIS — E1165 Type 2 diabetes mellitus with hyperglycemia: Secondary | ICD-10-CM

## 2015-11-08 LAB — POCT GLYCOSYLATED HEMOGLOBIN (HGB A1C): Hemoglobin A1C: 7.7

## 2015-11-08 MED ORDER — BASAGLAR KWIKPEN 100 UNIT/ML ~~LOC~~ SOPN: PEN_INJECTOR | SUBCUTANEOUS | Status: DC

## 2015-11-08 NOTE — Patient Instructions (Signed)
Please continue: - Metformin 1000 mg 2x a day - Invokana 300 mg before first meal of the day - Basaglar 30 units in am and 40 units in pm  Increase the: - NovoLog:  10 units with a snack 15 units for a smaller meal 20 units for a regular meal  25 units for a large meal  Please return in 3 months with your sugar log.

## 2015-11-08 NOTE — Progress Notes (Signed)
Subjective:     Patient ID: Mitchell Palmer, male   DOB: July 27, 1966, 50 y.o.   MRN: [id number]  Diabetes  Mr. Mitchell Palmer is a 50 y.o. man, returning for followup for DM2, dx in 2000, uncontrolled, insulin-dependent, with complications (CKD stage 3, erectile dysfunction, mild DR). Last visit 3.5 mo ago.  His last hemoglobin A1c was: Lab Results  Component Value Date   HGBA1C 8.0* 07/22/2015   HGBA1C 8.9* 12/06/2014   HGBA1C 9.1* 03/01/2014  He had steroid inj's in the past.  He is on a regimen of - free meds: - Metformin 1000 mg 2x a day - Invokana 300 mg before first meal of the day - Lantus 30 units in am and 40 units in pm (viceversa when not working) - increased from 30 units 2x a day ~ 69mo ago - NovoLog:  10 units with a snack 15 units for a smaller meal 20 units for a regular meal  25 units for a large meal  He was on: - Cycloset - started 04/22/2013 >> at 1.6 mg daily (tried 3 tabs >> dizziness >> backed off to 2) - Victoza 1.8 mg daily - Actos 45 mg daily - Januvia - glyburide/metformin - Avandia. - Bydureon 2 mg weekly - he hated the thick needle   He is checking his sugars 2-3 a day (he has a One Touch ultra meter). He brings his log today: - am: 118-165 (181, 199) >> 180s >> 125, 130-174, 200s when works nights >> 100-162, 186, 267 >>71, 93-160, 210 - 2 hours after b'fast: 155-286 >> 183-220 >> 91-231 >> 180-220 >> 178-216 >> n/c >>114-194, 202 >> 150-214 - before lunch: 83-150 >> 83, 94 >> 112-140 >> 133 >> 180s >> 84-147, 250, 275 >> 73-120, 162 >> 132, 171 - 2 hours after lunch: 129, 216 >> 175-220 >> 123, 222, 259 >> n/c >> 147-180, 288 >> 95, 135-217 - Before dinner: 220-250 >> 191-294 >> 180s >> 196, 200 >> n/c >> 107, 121-250 >> 106-190, 233 >> 95, 122-209, 224 - bedtime up to 300 (pizza) >> 180s >> n/c >> 125-158 (cut down on pizza) >> 175-250 >> 124, 183, 190 Lowest: 75. He has hypoglycemia awareness ~80. Highest 310 >> 288  He works nights: 6 pm - 7:30 am.  Works 3 days a week.   Meals: - 5:30-6 pm (at work): 2 bread slices + meat - snack at 9-10 pm: pistachios or 1/2 sandwich - 4:30 am: coffee - 11 am-12 pm: salad, meat + veggies + rice (leftovers from take out)  Eating [employer] cookies  - Has HL (increased TG). Last lipid panel: Lab Results  Component Value Date   CHOL 142 07/22/2015   HDL 34.70* 07/22/2015   LDLCALC 62 09/30/2013   LDLDIRECT 72.0 07/22/2015   TRIG 203.0* 07/22/2015   CHOLHDL 4 07/22/2015  He is on Atorvastatin. - last eye exam was with Dr. Katy Fitch in 08/2014, mild retinopathy, L>R  - No numbness or tingling in his legs. Foot exam performed 11/2014: normal - He does have chronic kidney disease - stage 2, last BUN/Cr:  Lab Results  Component Value Date   BUN 19 12/06/2014   CREATININE 1.22 12/06/2014   Office Visit on 12/07/2014  Component Date Value Ref Range Status  . Microalb, Ur 12/07/2014 <0.7  0.0 - 1.9 mg/dL Final  . Creatinine,U 12/07/2014 67.1   Final  . Microalb Creat Ratio 12/07/2014 1.0  0.0 - 30.0 mg/g Final  He is on Olmestartan.  He also has a history of hyperlipidemia, hypertension, GERD, esophageal stricture, plantar fasciitis, eustachian tube dysfunction, status post ear surgery, history of increased alcohol use.   Review of Systems Constitutional: no weight gain, no fatigue Eyes: no blurry vision, no xerophthalmia ENT: no sore throat, no nodules palpated in throat, no dysphagia/odynophagia, no hoarseness Cardiovascular: no CP/SOB/palpitations/leg swelling Respiratory: no cough/SOB Gastrointestinal: no N/V/D/C Musculoskeletal: no muscle aches/no joint aches/joint swelling Skin: no rashes  I reviewed pt's medications, allergies, PMH, social hx, family hx, and changes were documented in the history of present illness. Otherwise, unchanged from my initial visit note.   Objective:   Physical Exam BP 120/68 mmHg  Pulse 104  Temp(Src) 98.5 F (36.9 C) (Oral)  Resp 12  Wt 249 lb  (112.946 kg)  SpO2 96% Body mass index is 34.74 kg/(m^2).   Wt Readings from Last 3 Encounters:  11/08/15 249 lb (112.946 kg)  07/28/15 249 lb 9.6 oz (113.218 kg)  07/27/15 250 lb (113.399 kg)  Constitutional: obese, in NAD Eyes: PERRLA, EOMI, no exophthalmos ENT: moist mucous membranes, no thyromegaly, no cervical lymphadenopathy Cardiovascular: tachycardia, RR, No MRG Respiratory: CTA B Gastrointestinal: abdomen soft, NT, ND, BS+ Musculoskeletal: no deformities, strength intact in all 4 Skin: moist, warm, no rashes  Assessment:     1. 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  Sugars are a little worse 2/2 girl[employer]out cookies >> will increase Novolog. - I advised him to: Patient Instructions  Please continue: - Metformin 1000 mg 2x a day - Invokana 300 mg before first meal of the day - Basaglar 30 units in am and 40 units in pm  Increase the: - NovoLog:  10 units with a snack 15 units for a smaller meal 20 units for a regular meal  25 units for a large meal  Please return in 3 months with your sugar log.   - UTD with flu shot  - will have a new eye exam soon  - reviewed last hba1c >> 7.7 % (better) - switched from Lantus to [employer] per insurance preferrence - RTC in 3 mo

## 2015-11-24 ENCOUNTER — Other Ambulatory Visit: Payer: Self-pay | Admitting: Internal Medicine

## 2015-11-30 ENCOUNTER — Encounter: Payer: Self-pay | Admitting: Family Medicine

## 2015-11-30 ENCOUNTER — Ambulatory Visit (INDEPENDENT_AMBULATORY_CARE_PROVIDER_SITE_OTHER): Payer: 59 | Admitting: Family Medicine

## 2015-11-30 VITALS — BP 120/78 | HR 97 | Temp 98.2°F | Ht 70.0 in | Wt 252.0 lb

## 2015-11-30 DIAGNOSIS — Z794 Long term (current) use of insulin: Secondary | ICD-10-CM

## 2015-11-30 DIAGNOSIS — E1165 Type 2 diabetes mellitus with hyperglycemia: Secondary | ICD-10-CM | POA: Diagnosis not present

## 2015-11-30 DIAGNOSIS — M25512 Pain in left shoulder: Secondary | ICD-10-CM | POA: Diagnosis not present

## 2015-11-30 DIAGNOSIS — IMO0002 Reserved for concepts with insufficient information to code with codable children: Secondary | ICD-10-CM

## 2015-11-30 DIAGNOSIS — E1122 Type 2 diabetes mellitus with diabetic chronic kidney disease: Secondary | ICD-10-CM

## 2015-11-30 DIAGNOSIS — N183 Chronic kidney disease, stage 3 (moderate): Secondary | ICD-10-CM | POA: Diagnosis not present

## 2015-11-30 MED ORDER — METHYLPREDNISOLONE ACETATE 40 MG/ML IJ SUSP
80.0000 mg | Freq: Once | INTRAMUSCULAR | Status: AC
  Administered 2015-11-30: 80 mg via INTRA_ARTICULAR

## 2015-11-30 NOTE — Progress Notes (Signed)
Dr. Frederico Hamman T. Modine Oppenheimer, MD, [facility] [place], [address] Phone: [phone] Fax: [phone]  11/30/2015  Patient: Mitchell Palmer, MRN: [id number], DOB: 1966/01/26, 50 y.o.  Primary Physician:  Loura Pardon, MD   Chief Complaint  Patient presents with  . Shoulder Pain    left   . Numbness    fingers    Subjective:   Mitchell Palmer is a 50 y.o. very pleasant male patient who presents with the following:  L shoulder, put a hood on a truck and felt something. Has been hurting ever since. Moving and moving hand up and washing back will hurt. Some limitation in his range of motion and pain with movement, but is not as severe compared to his prior right shoulder injury, or any had a rotator cuff tear and ultimately needed surgery done by Dr. Tamera Punt.  He has tried some over-the-counter remedies including Tylenol and NSAIDs, but is still having persistent pain. He actually had to take some narcotic pain medication last night to help him go to sleep secondary to pain.  subac inj  Past Medical History, Surgical History, Social History, Family History, Problem List, Medications, and Allergies have been reviewed and updated if relevant.  Patient Active Problem List   Diagnosis Date Noted  . Uncontrolled type 2 diabetes mellitus with stage 3 chronic kidney disease, with long-term current use of insulin ([place]) 07/28/2015  . Alcohol consumption heavy 12/06/2014  . Viral URI with cough 12/06/2014  . Swelling, mass, or lump in head and neck 06/30/2014  . Necrobiosis diabeticorum ([place]) 03/01/2014  . Routine general medical examination at a health care facility 09/30/2013  . Low back pain 06/17/2012  . Abnormal chest x-ray 10/15/2011  . Right shoulder pain 10/12/2011  . PLANTAR FASCIITIS, TRAUMATIC 11/09/2009  . NEOPLASM UNCERTAIN BEHAVIOR OTHER SPEC SITES 10/07/2009  . ESOPHAGEAL STRICTURE 06/27/2009  . GERD 03/31/2009  .  OTHER DYSPHAGIA 03/31/2009  . Hyperlipidemia 01/06/2009  . Essential hypertension 01/06/2009  . ALLERGIC RHINITIS 01/06/2009    Past Medical History  Diagnosis Date  . Allergy     allegic rhinitis  . Diabetes mellitus     type II  . Hyperlipidemia   . Hypertension   . GERD (gastroesophageal reflux disease)   . Hyperplastic polyp of intestine 2010  . Esophageal stricture   . History of hernia repair     as a baby  . S/P ear surgery, follow-up exam     for drainage    Past Surgical History  Procedure Laterality Date  . Esophagogastroduodenoscopy  04/2009    stricture/GERD  . Hernia repair      age 64-midline  . Inner ear surgery  1988    went in behind rt ear-blockage  . Shoulder arthroscopy with rotator cuff repair and subacromial decompression Right 09/29/2012    Procedure: SHOULDER ARTHROSCOPY WITH ROTATOR CUFF REPAIR AND SUBACROMIAL DECOMPRESSION;  Surgeon: Nita Sells, MD;  Location: [facility];  Service: Orthopedics;  Laterality: Right;  Right shoulder arthroscopy with subacromial decompression and distal clavicle excision & Debridement of Labrial Tear    Social History   Social History  . Marital Status: Single    Spouse Name: N/A  . Number of Children: N/A  . Years of Education: N/A   Occupational History  . Not on file.   Social History Main Topics  . Smoking status: Never Smoker   . Smokeless tobacco: Never Used  .  Alcohol Use: 0.0 oz/week    0 Standard drinks or equivalent per week     Comment: 6 pk every 3 days; 12 pk/week  . Drug Use: No  . Sexual Activity: Not on file   Other Topics Concern  . Not on file   Social History Narrative    Family History  Problem Relation Age of Onset  . Diabetes Mother   . Cancer Father 66    colon cancer  . Diabetes Maternal Aunt   . Cancer Cousin     breast cancer  . Diabetes Other     Allergies  Allergen Reactions  . Codeine     REACTION: anxious    Medication list  reviewed and updated in full in [facility].  GEN: No fevers, chills. Nontoxic. Primarily MSK c/o today. MSK: Detailed in the HPI GI: tolerating PO intake without difficulty Neuro: No numbness, parasthesias, or tingling associated. Otherwise the pertinent positives of the ROS are noted above.   Objective:   BP 120/78 mmHg  Pulse 97  Temp(Src) 98.2 F (36.8 C) (Oral)  Ht 5\' 10"  (1.778 m)  Wt 251 lb 15.7 oz (114.297 kg)  BMI 36.16 kg/m2  SpO2 99%   GEN: WDWN, NAD, Non-toxic, Alert & Oriented x 3 HEENT: Atraumatic, Normocephalic.  Ears and Nose: No external deformity. EXTR: No clubbing/cyanosis/edema NEURO: Normal gait.  PSYCH: Normally interactive. Conversant. Not depressed or anxious appearing.  Calm demeanor.   Shoulder: L Inspection: No muscle wasting or winging Ecchymosis/edema: neg  AC joint, scapula, clavicle: NT Cervical spine: NT, full ROM Spurling's: neg Abduction: full, 4++/5, painful arc of motion Flexion: full, 5/5 IR, full, lift-off: 4++/5 ER at neutral: full, 5/5 AC crossover and compression: neg Neer: neg Hawkins: neg Drop Test: neg Empty Can: neg Supraspinatus insertion: NT Bicipital groove: NT Speed's: neg Yergason's: neg Sulcus sign: neg Scapular dyskinesis: none C5-T1 intact Sensation intact Grip 5/5   Radiology: No results found.  Assessment and Plan:   Left shoulder pain - Plan: methylPREDNISolone acetate (DEPO-MEDROL) injection 80 mg  Uncontrolled type 2 diabetes mellitus with stage 3 chronic kidney disease, with long-term current use of insulin ([facility])  Shoulder injury, more likely partial thickness injury to his rotator cuff, more likely supraspinatus and infraspinatus. Hip range of motion is full and strength is reasonably good, but his left is slightly weaker compared to his right. For now, we are gone up again the moon shoulder protocol and do a subacromial injection for pain relief.  If symptoms persist and he is not improved  after 4-6 weeks, then follow-up.  SubAC Injection, LEFT Verbal consent was obtained from the patient. Risks (including rare infection), benefits, and alternatives were explained. Patient prepped with Chloraprep and Ethyl Chloride used for anesthesia. The subacromial space was injected using the posterior approach. The patient tolerated the procedure well and had decreased pain post injection. No complications. Injection: 8 cc of Lidocaine 1% and 2 mL of Depo-Medrol 40 mg. Needle: 22 gauge   Follow-up: No Follow-up on file.  Signed,  Maud Deed. Oluwatimileyin Vivier, MD   Patient's Medications  New Prescriptions   No medications on file  Previous Medications   ATORVASTATIN (LIPITOR) 40 MG TABLET    Take 1 tablet (40 mg total) by mouth daily.   B-D INS SYR ULTRAFINE 1CC/31G 31G X 5/16" 1 ML MISC    INJECT 35 UNITS INTO THE SKIN EVERY 12 HOURS AS DIRECTED BY PHYSICIAN.   B-D UF III MINI PEN NEEDLES 31G X  5 MM MISC    USE 4X A DAY   BENICAR 40 MG TABLET    TAKE 1 TABLET BY MOUTH EVERY DAY   CYCLOBENZAPRINE (FLEXERIL) 10 MG TABLET    Take 1 tablet (10 mg total) by mouth 3 (three) times daily as needed for muscle spasms. For low back pain   DICLOFENAC (VOLTAREN) 75 MG EC TABLET    Take 1 tablet (75 mg total) by mouth 2 (two) times daily.   FLUTICASONE (FLONASE) 50 MCG/ACT NASAL SPRAY    USE 2 SPRAYS IN EACH NOSTRIL ONCE A DAY   GLUCOSE BLOOD (ONE TOUCH ULTRA TEST) TEST STRIP    TEST BLOOD SUGAR 4 TIMES A DAY AS DIRECTED   HYDROCODONE-ACETAMINOPHEN (NORCO/VICODIN) 5-325 MG PER TABLET    Take 1 tablet by mouth every 6 (six) hours as needed for moderate pain.   INSULIN GLARGINE (BASAGLAR KWIKPEN) 100 UNIT/ML SOPN    INJECT 40 UNITS UNDER SKIN AT THE BEGINNING OF YOUR DAY AND 30 UNITS AT BEDTIME   INVOKANA 300 MG TABS TABLET    TAKE 1 TABLET BY MOUTH DAILY BEFORE BREAKFAST   METFORMIN (GLUCOPHAGE) 1000 MG TABLET    TAKE 1 TABLET (1,000 MG TOTAL) BY MOUTH 2 (TWO) TIMES DAILY WITH A MEAL.   NOVOLOG FLEXPEN 100  UNIT/ML FLEXPEN    INJECT 15-25 UNITS INTO THE SKIN 3 TIMES DAILY WITH MEALS AND 10-12 UNITS WITH A SNACK.   OMEPRAZOLE (PRILOSEC) 40 MG CAPSULE    TAKE 1 CAPSULE (40 MG TOTAL) BY MOUTH DAILY.   TADALAFIL (CIALIS) 20 MG TABLET    Take 1 tablet (20 mg total) by mouth as directed.   VITAMIN B-12 (CYANOCOBALAMIN) 1000 MCG TABLET    Take 1,000 mcg by mouth daily.    Modified Medications   No medications on file  Discontinued Medications   No medications on file

## 2015-11-30 NOTE — Progress Notes (Signed)
Pre visit review using our clinic review tool, if applicable. No additional management support is needed unless otherwise documented below in the visit note. 

## 2015-12-15 ENCOUNTER — Other Ambulatory Visit: Payer: Self-pay | Admitting: Internal Medicine

## 2016-01-02 ENCOUNTER — Encounter: Payer: Self-pay | Admitting: Gastroenterology

## 2016-01-02 ENCOUNTER — Ambulatory Visit (INDEPENDENT_AMBULATORY_CARE_PROVIDER_SITE_OTHER): Payer: 59 | Admitting: Gastroenterology

## 2016-01-02 VITALS — BP 122/68 | HR 76 | Ht 70.0 in | Wt 251.0 lb

## 2016-01-02 DIAGNOSIS — K921 Melena: Secondary | ICD-10-CM | POA: Diagnosis not present

## 2016-01-02 DIAGNOSIS — Z800 Family history of malignant neoplasm of digestive organs: Secondary | ICD-10-CM

## 2016-01-02 DIAGNOSIS — K219 Gastro-esophageal reflux disease without esophagitis: Secondary | ICD-10-CM

## 2016-01-02 DIAGNOSIS — R1314 Dysphagia, pharyngoesophageal phase: Secondary | ICD-10-CM | POA: Diagnosis not present

## 2016-01-02 MED ORDER — NA SULFATE-K SULFATE-MG SULF 17.5-3.13-1.6 GM/177ML PO SOLN: 1.0000 | Freq: Once | ORAL | Status: DC

## 2016-01-02 NOTE — Patient Instructions (Signed)
You have been scheduled for an endoscopy and colonoscopy. Please follow the written instructions given to you at your visit today. Please pick up your prep supplies at the pharmacy within the next 1-3 days. If you use inhalers (even only as needed), please bring them with you on the day of your procedure. Your physician has requested that you go to www.[email or web address].com and enter the access code given to you at your visit today. This web site gives a general overview about your procedure. However, you should still follow specific instructions given to you by our office regarding your preparation for the procedure.  Normal BMI (Body Mass Index- based on height and weight) is between 19 and 25. Your BMI today is Body mass index is 36.01 kg/(m^2). [name] Please consider follow up  regarding your BMI with your Primary Care Provider.  Thank you for choosing me and [facility].  Pricilla Riffle. Dagoberto Ligas., MD., [facility]

## 2016-01-02 NOTE — Progress Notes (Signed)
    History of Present Illness: This is a 50 year old male self referred for the evaluation of rectal bleeding, family history of colon cancer, GERD and dysphagia. Patient has noted small amounts of bright red blood with bowel movements on 2 or 3 occasions over the past several months. He notes occasional mild constipation and occasional looser stools however this pattern has not changed for many years. He previously underwent colonoscopy in September 2010 for a family history of colon cancer. He was sent a letter to return for colonoscopy in September 2015 however he did not return. He has a long history of GERD which is generally well controlled on daily omeprazole. Previously underwent EGD in September 2010 showing an esophagogastric junction stricture which was dilated a small hiatal hernia and gastritis. For the past 6 months he has noted intermittent mild solid food dysphagia. Denies weight loss, abdominal pain, change in stool caliber, melena, hematochezia, nausea, vomiting, chest pain.  Review of Systems: Pertinent positive and negative review of systems were noted in the above HPI section. All other review of systems were otherwise negative.  Current Medications, Allergies, Past Medical History, Past Surgical History, Family History and Social History were reviewed in [employer] record.  Physical Exam: General: Well developed, well nourished, no acute distress Head: Normocephalic and atraumatic Eyes:  sclerae anicteric, EOMI Ears: Normal auditory acuity Mouth: No deformity or lesions Neck: Supple, no masses or thyromegaly Lungs: Clear throughout to auscultation Heart: Regular rate and rhythm; no murmurs, rubs or bruits Abdomen: Soft, non tender and non distended. No masses, hepatosplenomegaly or hernias noted. Normal Bowel sounds Rectal: Deferred to colonoscopy Musculoskeletal: Symmetrical with no gross deformities  Skin: No lesions on visible extremities Pulses:   Normal pulses noted Extremities: No clubbing, cyanosis, edema or deformities noted Neurological: Alert oriented x 4, grossly nonfocal Cervical Nodes:  No significant cervical adenopathy Inguinal Nodes: No significant inguinal adenopathy Psychological:  Alert and cooperative. Normal mood and affect  Assessment and Recommendations:  1. Small volume hematochezia and father with colon cancer at age 71. Suspected benign anorectal source leading to bleeding. Rule out colorectal neoplasms. He is overdue for colonoscopy. Schedule colonoscopy. The risks (including bleeding, perforation, infection, missed lesions, medication reactions and possible hospitalization or surgery if complications occur), benefits, and alternatives to colonoscopy with possible biopsy and possible polypectomy were discussed with the patient and they consent to proceed.   2. GERD and solid food dysphagia. Likely has recurrent esophageal stricture. Continue omeprazole 40 mg daily and standard antireflux measures. Schedule EGD with possible dilation. The risks (including bleeding, perforation, infection, missed lesions, medication reactions and possible hospitalization or surgery if complications occur), benefits, and alternatives to endoscopy with possible biopsy and possible dilation were discussed with the patient and they consent to proceed.

## 2016-01-05 ENCOUNTER — Other Ambulatory Visit: Payer: Self-pay | Admitting: Internal Medicine

## 2016-01-17 ENCOUNTER — Other Ambulatory Visit: Payer: Self-pay | Admitting: Internal Medicine

## 2016-01-19 ENCOUNTER — Other Ambulatory Visit: Payer: Self-pay | Admitting: Family Medicine

## 2016-01-19 DIAGNOSIS — I10 Essential (primary) hypertension: Secondary | ICD-10-CM

## 2016-01-19 DIAGNOSIS — E785 Hyperlipidemia, unspecified: Secondary | ICD-10-CM

## 2016-01-19 NOTE — Telephone Encounter (Signed)
Rx isn't on med list and doesn't look like it's been prescribed since 2015, last OV was 07/27/15, please advise

## 2016-01-19 NOTE — Telephone Encounter (Signed)
Please check in with him and see what he wants it for since it has been so long  Ask if he is taking any otc pain meds also Thanks

## 2016-01-20 NOTE — Telephone Encounter (Signed)
Pt said that med was prescribed in 08/206 and it didn't work so pt stopped taking it a few months ago, med removed from med list and pt will only take the naproxen.   Message routed back to Dr. Glori Bickers so she can put lab orders in

## 2016-01-20 NOTE — Telephone Encounter (Signed)
Called pt and no answer so left voicemail requesting pt to call office back

## 2016-01-20 NOTE — Telephone Encounter (Signed)
Pt saw Dr. Lorelei Pont for arthritis in left shoulder on 11/30/15 and he gave him an injection in it. Pt said the pain has returned and he had a few old pills of this med, pt said he took one and it helped a lot with the shoulder pain, pt is requesting a refill of med, he hasn't tried any OTC meds for his pain.  Also pt is coming in on Monday for labs and would like to have is testosterone drawn with his normal labs for his f/u with you next week, pt said he has been feeling really fatigued and tired, if okay please put all lab orders in including a testosterone level

## 2016-01-20 NOTE — Telephone Encounter (Signed)
Pt returned your call [phone]

## 2016-01-20 NOTE — Telephone Encounter (Signed)
His med list still has voltaren on it - that is in he same class as the naproxen-please make sure he is not taking the diclofenac/ remove from list and then refill naproxen times 3 Return this to me and I will do lab order

## 2016-01-23 ENCOUNTER — Other Ambulatory Visit (INDEPENDENT_AMBULATORY_CARE_PROVIDER_SITE_OTHER): Payer: 59

## 2016-01-23 DIAGNOSIS — I10 Essential (primary) hypertension: Secondary | ICD-10-CM | POA: Diagnosis not present

## 2016-01-23 DIAGNOSIS — E785 Hyperlipidemia, unspecified: Secondary | ICD-10-CM | POA: Diagnosis not present

## 2016-01-23 LAB — LIPID PANEL
CHOL/HDL RATIO: 4
CHOLESTEROL: 133 mg/dL (ref 0–200)
HDL: 35.5 mg/dL — ABNORMAL LOW (ref >39.00)
NonHDL: 97.41
TRIGLYCERIDES: 226 mg/dL — AB (ref 0.0–149.0)
VLDL: 45.2 mg/dL — ABNORMAL HIGH (ref 0.0–40.0)

## 2016-01-23 LAB — CBC WITH DIFFERENTIAL/PLATELET
BASOS ABS: 0.1 10*3/uL (ref 0.0–0.1)
Basophils Relative: 0.8 % (ref 0.0–3.0)
EOS ABS: 0.3 10*3/uL (ref 0.0–0.7)
Eosinophils Relative: 5 % (ref 0.0–5.0)
HEMATOCRIT: 44.8 % (ref 39.0–52.0)
HEMOGLOBIN: 15.4 g/dL (ref 13.0–17.0)
LYMPHS PCT: 27.6 % (ref 12.0–46.0)
Lymphs Abs: 1.9 10*3/uL (ref 0.7–4.0)
MCHC: 34.3 g/dL (ref 30.0–36.0)
MCV: 98.8 fl (ref 78.0–100.0)
MONOS PCT: 10.6 % (ref 3.0–12.0)
Monocytes Absolute: 0.7 10*3/uL (ref 0.1–1.0)
NEUTROS ABS: 3.8 10*3/uL (ref 1.4–7.7)
Neutrophils Relative %: 56 % (ref 43.0–77.0)
PLATELETS: 225 10*3/uL (ref 150.0–400.0)
RBC: 4.54 Mil/uL (ref 4.22–5.81)
RDW: 13.5 % (ref 11.5–15.5)
WBC: 6.8 10*3/uL (ref 4.0–10.5)

## 2016-01-23 LAB — COMPREHENSIVE METABOLIC PANEL
ALBUMIN: 4.1 g/dL (ref 3.5–5.2)
ALK PHOS: 64 U/L (ref 39–117)
ALT: 46 U/L (ref 0–53)
AST: 25 U/L (ref 0–37)
BILIRUBIN TOTAL: 0.6 mg/dL (ref 0.2–1.2)
BUN: 23 mg/dL (ref 6–23)
CALCIUM: 9.1 mg/dL (ref 8.4–10.5)
CO2: 28 mEq/L (ref 19–32)
CREATININE: 1.12 mg/dL (ref 0.40–1.50)
Chloride: 102 mEq/L (ref 96–112)
GFR: 74.1 mL/min (ref >60.00)
Glucose, Bld: 136 mg/dL — ABNORMAL HIGH (ref 70–99)
Potassium: 4.4 mEq/L (ref 3.5–5.1)
Sodium: 138 mEq/L (ref 135–145)
TOTAL PROTEIN: 6.3 g/dL (ref 6.0–8.3)

## 2016-01-23 LAB — TSH: TSH: 8.97 u[IU]/mL — AB (ref 0.35–4.50)

## 2016-01-23 LAB — LDL CHOLESTEROL, DIRECT: Direct LDL: 59 mg/dL

## 2016-01-23 NOTE — Telephone Encounter (Signed)
-----   Message from Marchia Bond sent at 01/23/2016  9:26 AM EDT ----- Regarding: pt had f/u labs this am, need orders ASAP! Thanks!! :-) Please order  future f/u labs for pt's upcoming lab appt. Thanks Aniceto Boss

## 2016-01-24 ENCOUNTER — Ambulatory Visit (INDEPENDENT_AMBULATORY_CARE_PROVIDER_SITE_OTHER): Payer: 59 | Admitting: Family Medicine

## 2016-01-24 ENCOUNTER — Encounter: Payer: Self-pay | Admitting: Family Medicine

## 2016-01-24 VITALS — BP 128/78 | HR 78 | Temp 98.3°F | Ht 70.0 in | Wt 255.5 lb

## 2016-01-24 DIAGNOSIS — E669 Obesity, unspecified: Secondary | ICD-10-CM | POA: Diagnosis not present

## 2016-01-24 DIAGNOSIS — E6609 Other obesity due to excess calories: Secondary | ICD-10-CM | POA: Insufficient documentation

## 2016-01-24 DIAGNOSIS — E785 Hyperlipidemia, unspecified: Secondary | ICD-10-CM | POA: Diagnosis not present

## 2016-01-24 DIAGNOSIS — Z6834 Body mass index (BMI) 34.0-34.9, adult: Secondary | ICD-10-CM | POA: Insufficient documentation

## 2016-01-24 DIAGNOSIS — E039 Hypothyroidism, unspecified: Secondary | ICD-10-CM

## 2016-01-24 DIAGNOSIS — Z789 Other specified health status: Secondary | ICD-10-CM

## 2016-01-24 DIAGNOSIS — F1010 Alcohol abuse, uncomplicated: Secondary | ICD-10-CM

## 2016-01-24 DIAGNOSIS — I10 Essential (primary) hypertension: Secondary | ICD-10-CM | POA: Diagnosis not present

## 2016-01-24 MED ORDER — TADALAFIL 20 MG PO TABS
20.0000 mg | ORAL_TABLET | ORAL | Status: DC
Start: 2016-01-24 — End: 2017-04-23

## 2016-01-24 MED ORDER — LEVOTHYROXINE SODIUM 50 MCG PO TABS: 50.0000 ug | ORAL_TABLET | Freq: Every day | ORAL | Status: DC

## 2016-01-24 NOTE — Assessment & Plan Note (Signed)
Elevated tsh  Lab Results  Component Value Date   TSH 8.97* 01/23/2016    Will try levothyroxine 50 mcg daily -disc poss side eff  Re check 6 weeks  This may be reason for fatigue

## 2016-01-24 NOTE — Progress Notes (Signed)
Subjective:    Patient ID: Mitchell Palmer, male    DOB: Jan 22, 1966, 50 y.o.   MRN: [id number]  HPI Here for f/u of chronic health problems   Generally sluggish-not a lot of energy to do a lot  Would rather sleep than do anything  Thinks he snores at night  ? If he has apnea  Sometimes wakes up un refreshed  He does not nod off during the day  Tends to wake up frequently at night  Not getting much sleep  Works at night - unsure if this will ever change    Obese- wt is up 4 lb with bmi of 36 Not getting as much exercise/ too tired   bp is stable today  No cp or palpitations or headaches or edema  No side effects to medicines  BP Readings from Last 3 Encounters:  01/24/16 128/78  01/02/16 122/68  11/30/15 120/78     This is stable on benicar   Sees Dr Cruzita Lederer for DM2 Lab Results  Component Value Date   HGBA1C 7.7 11/08/2015   Is on metformin invokana and basal insulin and short acting insulin  Eating is fair  Sometimes it is irratic because of his job  Has to eat snacks to prevent hypoglycemia   No exercise beyond job     Hyperlipidemia Lab Results  Component Value Date   CHOL 133 01/23/2016   CHOL 142 07/22/2015   CHOL 147 12/06/2014   Lab Results  Component Value Date   HDL 35.50* 01/23/2016   HDL 34.70* 07/22/2015   HDL 37.20* 12/06/2014   Lab Results  Component Value Date   LDLCALC 62 09/30/2013   LDLCALC 67 07/08/2009   Lab Results  Component Value Date   TRIG 226.0* 01/23/2016   TRIG 203.0* 07/22/2015   TRIG 225.0* 12/06/2014   Lab Results  Component Value Date   CHOLHDL 4 01/23/2016   CHOLHDL 4 07/22/2015   CHOLHDL 4 12/06/2014   Lab Results  Component Value Date   LDLDIRECT 59.0 01/23/2016   LDLDIRECT 72.0 07/22/2015   LDLDIRECT 79.0 12/06/2014   On atorvastatin and diet  Cannot currently add exercise  Very good LDL in the 60s  Avoids fried foods   Alcohol intake   Hx of CKD stage 2   Chemistry      Component Value  Date/Time   NA 138 01/23/2016 1035   K 4.4 01/23/2016 1035   CL 102 01/23/2016 1035   CO2 28 01/23/2016 1035   BUN 23 01/23/2016 1035   CREATININE 1.12 01/23/2016 1035      Component Value Date/Time   CALCIUM 9.1 01/23/2016 1035   ALKPHOS 64 01/23/2016 1035   AST 25 01/23/2016 1035   ALT 46 01/23/2016 1035   BILITOT 0.6 01/23/2016 1035      Thyroid was off this visit Lab Results  Component Value Date   TSH 8.97* 01/23/2016   is experiencing fatigue  No problems in the past  No hx of problems or goiter   No anemic Lab Results  Component Value Date   WBC 6.8 01/23/2016   HGB 15.4 01/23/2016   HCT 44.8 01/23/2016   MCV 98.8 01/23/2016   PLT 225.0 01/23/2016      Wanted testosterone check Also dec libido   Mood is a bit down  Not interested in counseling  Was interested in a relationship / is not going to work out   Alcohol intake - 6 pack some days and nothing  some days   Patient Active Problem List   Diagnosis Date Noted  . Obesity 01/24/2016  . Hypothyroidism 01/24/2016  . Uncontrolled type 2 diabetes mellitus with stage 3 chronic kidney disease, with long-term current use of insulin ([place]) 07/28/2015  . Alcohol consumption heavy 12/06/2014  . Swelling, mass, or lump in head and neck 06/30/2014  . Necrobiosis diabeticorum ([place]) 03/01/2014  . Low back pain 06/17/2012  . Abnormal chest x-ray 10/15/2011  . PLANTAR FASCIITIS, TRAUMATIC 11/09/2009  . NEOPLASM UNCERTAIN BEHAVIOR OTHER SPEC SITES 10/07/2009  . ESOPHAGEAL STRICTURE 06/27/2009  . GERD 03/31/2009  . OTHER DYSPHAGIA 03/31/2009  . Hyperlipidemia 01/06/2009  . Essential hypertension 01/06/2009  . ALLERGIC RHINITIS 01/06/2009   Past Medical History  Diagnosis Date  . Allergy     allegic rhinitis  . Diabetes mellitus     type II  . Hyperlipidemia   . Hypertension   . GERD (gastroesophageal reflux disease)   . Hyperplastic polyp of intestine 2010  . Esophageal stricture   . History of hernia  repair     as a baby  . S/P ear surgery, follow-up exam     for drainage   Past Surgical History  Procedure Laterality Date  . Esophagogastroduodenoscopy  04/2009    stricture/GERD  . Hernia repair      age 2-midline  . Inner ear surgery  1988    went in behind rt ear-blockage  . Shoulder arthroscopy with rotator cuff repair and subacromial decompression Right 09/29/2012    Procedure: SHOULDER ARTHROSCOPY WITH ROTATOR CUFF REPAIR AND SUBACROMIAL DECOMPRESSION;  Surgeon: Nita Sells, MD;  Location: [facility];  Service: Orthopedics;  Laterality: Right;  Right shoulder arthroscopy with subacromial decompression and distal clavicle excision & Debridement of Labrial Tear   Social History  Substance Use Topics  . Smoking status: Never Smoker   . Smokeless tobacco: Never Used  . Alcohol Use: 0.0 oz/week    0 Standard drinks or equivalent per week     Comment: 6 pk every 3 days; 12 pk/week   Family History  Problem Relation Age of Onset  . Diabetes Mother   . Colon cancer Father 26  . Diabetes Maternal Aunt   . Cancer Cousin     breast cancer  . Diabetes Other    Allergies  Allergen Reactions  . Codeine     REACTION: anxious   Current Outpatient Prescriptions on File Prior to Visit  Medication Sig Dispense Refill  . atorvastatin (LIPITOR) 40 MG tablet Take 1 tablet (40 mg total) by mouth daily. 30 tablet 11  . B-D INS SYR ULTRAFINE 1CC/31G 31G X 5/16" 1 ML MISC INJECT 35 UNITS INTO THE SKIN EVERY 12 HOURS AS DIRECTED BY PHYSICIAN. 200 each 2  . B-D UF III MINI PEN NEEDLES 31G X 5 MM MISC USE 4 TIMES A DAY 300 each 4  . BENICAR 40 MG tablet TAKE 1 TABLET BY MOUTH EVERY DAY 30 tablet 11  . cyclobenzaprine (FLEXERIL) 10 MG tablet Take 1 tablet (10 mg total) by mouth 3 (three) times daily as needed for muscle spasms. For low back pain 40 tablet 1  . fluticasone (FLONASE) 50 MCG/ACT nasal spray USE 2 SPRAYS IN EACH NOSTRIL ONCE A DAY 16 g 5  . glucose blood  (ONE TOUCH ULTRA TEST) test strip TEST BLOOD SUGAR 4 TIMES A DAY AS DIRECTED 200 each 11  . HYDROcodone-acetaminophen (NORCO/VICODIN) 5-325 MG per tablet Take 1 tablet by mouth every 6 (  six) hours as needed for moderate pain. 40 tablet 0  . Insulin Glargine (BASAGLAR KWIKPEN) 100 UNIT/ML SOPN INJECT 40 UNITS UNDER SKIN AT THE BEGINNING OF YOUR DAY AND 30 UNITS AT BEDTIME 30 mL 5  . INVOKANA 300 MG TABS tablet TAKE 1 TABLET BY MOUTH DAILY BEFORE BREAKFAST 30 tablet 2  . metFORMIN (GLUCOPHAGE) 1000 MG tablet TAKE 1 TABLET (1,000 MG TOTAL) BY MOUTH 2 (TWO) TIMES DAILY WITH A MEAL. 60 tablet 1  . Na Sulfate-K Sulfate-Mg Sulf SOLN Take 1 kit by mouth once. 354 mL 0  . naproxen (NAPROSYN) 500 MG tablet TAKE 1 TABLET (500 MG TOTAL) BY MOUTH 2 (TWO) TIMES DAILY WITH A MEAL. 60 tablet 3  . NOVOLOG FLEXPEN 100 UNIT/ML FlexPen INJECT 15-25 UNITS INTO THE SKIN 3 TIMES DAILY WITH MEALS AND 10-12 UNITS WITH A SNACK. 30 mL 2  . omeprazole (PRILOSEC) 40 MG capsule TAKE 1 CAPSULE (40 MG TOTAL) BY MOUTH DAILY. 30 capsule 5  . vitamin B-12 (CYANOCOBALAMIN) 1000 MCG tablet Take 1,000 mcg by mouth daily.       No current facility-administered medications on file prior to visit.    Review of Systems Review of Systems  Constitutional: Negative for fever, appetite change, pos for fatigue and unexpected weight change.  Eyes: Negative for pain and visual disturbance.  Respiratory: Negative for cough and shortness of breath.   Cardiovascular: Negative for cp or palpitations    Gastrointestinal: Negative for nausea, diarrhea and constipation.  Genitourinary: Negative for urgency and frequency.  Skin: Negative for pallor or rash   Neurological: Negative for weakness, light-headedness, numbness and headaches.  Hematological: Negative for adenopathy. Does not bruise/bleed easily.  Psychiatric/Behavioral: Negative for dysphoric mood. The patient is not nervous/anxious.         Objective:   Physical Exam    Constitutional: He appears well-developed and well-nourished. No distress.  obese and well appearing   HENT:  Head: Normocephalic and atraumatic.  Mouth/Throat: Oropharynx is clear and moist.  Eyes: Conjunctivae and EOM are normal. Pupils are equal, round, and reactive to light.  Neck: Normal range of motion. Neck supple. No JVD present. Carotid bruit is not present. No thyromegaly present.  Cardiovascular: Normal rate, regular rhythm, normal heart sounds and intact distal pulses.  Exam reveals no gallop.   Pulmonary/Chest: Effort normal and breath sounds normal. No respiratory distress. He has no wheezes. He has no rales.  No crackles  Abdominal: Soft. Bowel sounds are normal. He exhibits no distension, no abdominal bruit and no mass. There is no tenderness.  Musculoskeletal: He exhibits no edema.  Lymphadenopathy:    He has no cervical adenopathy.  Neurological: He is alert. He has normal reflexes.  Skin: Skin is warm and dry. No rash noted.  Psychiatric: He has a normal mood and affect.          Assessment & Plan:   Problem List Items Addressed This Visit      Cardiovascular and Mediastinum   Essential hypertension - Primary    bp in fair control at this time  BP Readings from Last 1 Encounters:  01/24/16 128/78   No changes needed Disc lifstyle change with low sodium diet and exercise  Labs reviewed  Enc wt loss and healthy exercise       Relevant Medications   tadalafil (CIALIS) 20 MG tablet     Endocrine   Hypothyroidism    Elevated tsh  Lab Results  Component Value Date   TSH 8.97*  01/23/2016    Will try levothyroxine 50 mcg daily -disc poss side eff  Re check 6 weeks  This may be reason for fatigue       Relevant Medications   levothyroxine (SYNTHROID, LEVOTHROID) 50 MCG tablet     Other   Obesity    Discussed how this problem influences overall health and the risks it imposes  Reviewed plan for weight loss with lower calorie diet (via better food  choices and also portion control or program like weight watchers) and exercise building up to or more than 30 minutes 5 days per week including some aerobic activity         Hyperlipidemia    Disc goals for lipids and reasons to control them Rev labs with pt Rev low sat fat diet in detail Enc exercise for HDL        Relevant Medications   tadalafil (CIALIS) 20 MG tablet   Alcohol consumption heavy    Adv to work on cutting back for better health Continue to follow liver tests

## 2016-01-24 NOTE — Assessment & Plan Note (Signed)
Discussed how this problem influences overall health and the risks it imposes  Reviewed plan for weight loss with lower calorie diet (via better food choices and also portion control or program like weight watchers) and exercise building up to or more than 30 minutes 5 days per week including some aerobic activity    

## 2016-01-24 NOTE — Assessment & Plan Note (Signed)
bp in fair control at this time  BP Readings from Last 1 Encounters:  01/24/16 128/78   No changes needed Disc lifstyle change with low sodium diet and exercise  Labs reviewed  Enc wt loss and healthy exercise

## 2016-01-24 NOTE — Assessment & Plan Note (Signed)
Adv to work on cutting back for better health Continue to follow liver tests

## 2016-01-24 NOTE — Assessment & Plan Note (Signed)
Disc goals for lipids and reasons to control them Rev labs with pt Rev low sat fat diet in detail Enc exercise for HDL

## 2016-01-24 NOTE — Patient Instructions (Signed)
Take care of yourself  Do cut back on alcohol -for better sleep and health  Start the thyroid supplement- we will get labs in about 6 weeks Continue follow up for your diabetes Stay active and keep working on weight loss

## 2016-01-24 NOTE — Progress Notes (Signed)
Pre visit review using our clinic review tool, if applicable. No additional management support is needed unless otherwise documented below in the visit note. 

## 2016-02-08 ENCOUNTER — Telehealth: Payer: Self-pay | Admitting: Internal Medicine

## 2016-02-08 NOTE — Telephone Encounter (Signed)
Pt needs [place] to call in a new rx for humalog the novolog is no longer covered [facility] [address]

## 2016-02-08 NOTE — Telephone Encounter (Signed)
Yes, no pb. No dose changes needed.

## 2016-02-08 NOTE — Telephone Encounter (Signed)
Patient asking to switch from novolog to humalog, the humalog is not on patient medication list. Please advise. Thanks!

## 2016-02-09 ENCOUNTER — Telehealth: Payer: Self-pay | Admitting: Internal Medicine

## 2016-02-09 ENCOUNTER — Other Ambulatory Visit: Payer: Self-pay

## 2016-02-09 NOTE — Telephone Encounter (Signed)
See below

## 2016-02-09 NOTE — Telephone Encounter (Signed)
PT requests call back from Dr. Arman Filter nurse

## 2016-02-10 MED ORDER — INSULIN LISPRO 100 UNIT/ML (KWIKPEN): PEN_INJECTOR | SUBCUTANEOUS | Status: DC

## 2016-02-10 NOTE — Telephone Encounter (Signed)
Rx submitted per escript.

## 2016-02-10 NOTE — Telephone Encounter (Signed)
[facility] [address] for the humalog this needs to be done asap he has been waiting since the 21st

## 2016-02-10 NOTE — Telephone Encounter (Signed)
Rx refill completed for humalog. Discontinued novolog.

## 2016-02-10 NOTE — Addendum Note (Signed)
Addended by: Caprice Beaver T on: 02/10/2016 09:50 AM   Modules accepted: Orders, Medications

## 2016-02-13 ENCOUNTER — Other Ambulatory Visit: Payer: Self-pay | Admitting: Orthopedic Surgery

## 2016-02-13 DIAGNOSIS — M67912 Unspecified disorder of synovium and tendon, left shoulder: Secondary | ICD-10-CM

## 2016-02-15 ENCOUNTER — Ambulatory Visit (INDEPENDENT_AMBULATORY_CARE_PROVIDER_SITE_OTHER): Payer: 59 | Admitting: Internal Medicine

## 2016-02-15 VITALS — BP 138/84 | HR 91 | Ht 69.0 in | Wt 253.0 lb

## 2016-02-15 DIAGNOSIS — E1122 Type 2 diabetes mellitus with diabetic chronic kidney disease: Secondary | ICD-10-CM

## 2016-02-15 DIAGNOSIS — N183 Chronic kidney disease, stage 3 (moderate): Secondary | ICD-10-CM | POA: Diagnosis not present

## 2016-02-15 DIAGNOSIS — E1165 Type 2 diabetes mellitus with hyperglycemia: Secondary | ICD-10-CM | POA: Diagnosis not present

## 2016-02-15 DIAGNOSIS — Z794 Long term (current) use of insulin: Secondary | ICD-10-CM | POA: Diagnosis not present

## 2016-02-15 DIAGNOSIS — IMO0002 Reserved for concepts with insufficient information to code with codable children: Secondary | ICD-10-CM

## 2016-02-15 LAB — POCT GLYCOSYLATED HEMOGLOBIN (HGB A1C): Hemoglobin A1C: 7.8

## 2016-02-15 LAB — HM DIABETES FOOT EXAM: HM DIABETIC FOOT EXAM: NORMAL

## 2016-02-15 LAB — MICROALBUMIN / CREATININE URINE RATIO
Creatinine,U: 80.7 mg/dL
MICROALB/CREAT RATIO: 0.9 mg/g (ref 0.0–30.0)

## 2016-02-15 NOTE — Progress Notes (Signed)
Subjective:     Patient ID: Mitchell Palmer, male   DOB: 08-Jan-1966, 50 y.o.   MRN: [id number]  Diabetes  Mitchell Palmer is a 50 y.o. man, returning for followup for DM2, dx in 2000, uncontrolled, insulin-dependent, with complications (CKD stage 3, erectile dysfunction, mild DR). Last visit 3 mo ago.  His last hemoglobin A1c was: Lab Results  Component Value Date   HGBA1C 7.7 11/08/2015   HGBA1C 8.0* 07/22/2015   HGBA1C 8.9* 12/06/2014  He had steroid inj's in the past.  He is on a regimen of - free meds: - Metformin 1000 mg 2x a day - Invokana 300 mg before first meal of the day - Basaglar 30 units in am and 40 units in pm (viceversa when not working) - Humalog:  12 units with a snack 15 units for a smaller meal 20 units for a regular meal  25 units for a large meal  He was on: - Cycloset - started 04/22/2013 >> at 1.6 mg daily (tried 3 tabs >> dizziness >> backed off to 2) - Victoza 1.8 mg daily - Actos 45 mg daily - Januvia - glyburide/metformin - Avandia. - Bydureon 2 mg weekly - he hated the thick needle   He is checking his sugars 2-3 a day (he has a One Touch ultra meter). He forgot his log today: - am: 180s >> 125, 130-174, 200s when works nights >> 100-162, 186, 267 >> 71, 93-160, 210 >> 86, 140-149 (if 120s, he does not take the Basaglar) - 2 hours after b'fast: 155-286 >> 183-220 >> 91-231 >> 180-220 >> 178-216 >> n/c >>114-194, 202 >> 150-214 >> n/c - before lunch: 83-150 >> 83, 94 >> 112-140 >> 133 >> 180s >> 84-147, 250, 275 >> 73-120, 162 >> 132, 171 >> 120-170 (higher if eats a cookie at night if sugars in to 80s) - 2 hours after lunch: 129, 216 >> 175-220 >> 123, 222, 259 >> n/c >> 147-180, 288 >> 95, 135-217 >> 150-240 - Before dinner: 220-250 >> 191-294 >> 180s >> 196, 200 >> n/c >> 107, 121-250 >> 106-190, 233 >> 95, 122-209, 224 >> 140s - bedtime up to 300 (pizza) >> 180s >> n/c >> 125-158 (cut down on pizza) >> 175-250 >> 124, 183, 190 >> 140s Lowest: 75 >>  86. He has hypoglycemia awareness ~80. Highest 310 >> 288 >> 349 (forgot insulin)  He works nights: 6 pm - 7:30 am. Works 3 days a week.   Meals: - 5:30-6 pm (at work): 2 bread slices + meat - snack at 9-10 pm: pistachios or 1/2 sandwich - 4:30 am: coffee - 11 am-12 pm: salad, meat + veggies + rice (leftovers from take out)  - Has HL (increased TG). Last lipid panel: Lab Results  Component Value Date   CHOL 133 01/23/2016   HDL 35.50* 01/23/2016   LDLCALC 62 09/30/2013   LDLDIRECT 59.0 01/23/2016   TRIG 226.0* 01/23/2016   CHOLHDL 4 01/23/2016  He is on Atorvastatin. - last eye exam was with Dr. Katy Fitch in 09/2015, mild retinopathy, L>R  - No numbness or tingling in his legs. Foot exam performed 11/2014: normal - He does have chronic kidney disease - stage 2, last BUN/Cr:  Lab Results  Component Value Date   BUN 23 01/23/2016   CREATININE 1.12 01/23/2016   Office Visit on 12/07/2014  Component Date Value Ref Range Status  . Microalb, Ur 12/07/2014 <0.7  0.0 - 1.9 mg/dL Final  . Creatinine,U 12/07/2014 67.1  Final  . Microalb Creat Ratio 12/07/2014 1.0  0.0 - 30.0 mg/g Final  He is on Olmesartan.  He also has a history of hyperlipidemia, hypertension, GERD, esophageal stricture, plantar fasciitis, eustachian tube dysfunction, status post ear surgery, history of increased alcohol use.  He has hypothyroidism - managed by PCP. On LT4 50 mcg daily - started 01/23/2016.  Lab Results  Component Value Date   TSH 8.97* 01/23/2016   Review of Systems Constitutional: no weight gain, no fatigue Eyes: no blurry vision, no xerophthalmia ENT: no sore throat, no nodules palpated in throat, no dysphagia/odynophagia, no hoarseness Cardiovascular: no CP/SOB/palpitations/leg swelling Respiratory: no cough/SOB Gastrointestinal: no N/V/+ D/no C Musculoskeletal: no muscle aches/no joint aches/joint swelling Skin: no rashes Diabetic Foot Exam - Simple   Simple Foot Form  Diabetic Foot  exam was performed with the following findings:  Yes 02/15/2016 11:23 AM  Visual Inspection  No deformities, no ulcerations, no other skin breakdown bilaterally:  Yes  See comments:  Yes  Sensation Testing  Intact to touch and monofilament testing bilaterally:  Yes  Pulse Check  Posterior Tibialis and Dorsalis pulse intact bilaterally:  Yes  Comments  B calluses on pedal surface of feet     I reviewed pt's medications, allergies, PMH, social hx, family hx, and changes were documented in the history of present illness. Otherwise, unchanged from my initial visit note.   Objective:   Physical Exam BP 138/84 mmHg  Pulse 91  Ht 5\' 9"  (1.753 m)  Wt 253 lb (114.76 kg)  BMI 37.34 kg/m2  SpO2 97% Body mass index is 37.34 kg/(m^2).   Wt Readings from Last 3 Encounters:  02/15/16 253 lb (114.76 kg)  01/24/16 255 lb 8 oz (115.894 kg)  01/02/16 251 lb (113.853 kg)  Constitutional: obese, in NAD Eyes: PERRLA, EOMI, no exophthalmos ENT: moist mucous membranes, no thyromegaly, no cervical lymphadenopathy Cardiovascular: tachycardia, RR, No MRG Respiratory: CTA B Gastrointestinal: abdomen soft, NT, ND, BS+ Musculoskeletal: no deformities, strength intact in all 4 Skin: moist, warm, no rashes  Assessment:     1. 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 Per his recall, sugars appear better c/w last visit, but high after a meal with dessert or after fried foods >> will increase insulin with these meals. - I advised him to: Patient Instructions  Please continue: - Metformin 1000 mg 2x a day  - Invokana 300 mg before first meal of the day  - Lantus 30 units in am and 40 units in pm - NovoLog:  12 units with a snack  15 units for a smaller meal  20 units for a regular  meal  25 units for a large meal Use 30 units if you have dessert or fried food.  Please return in 3 months with your sugar log.   - UTD with flu shot  - advised for yearly eye exams >> UTD - will check an ACR - reviewed last hba1c >> 7.8% (higher) - RTC in 3 mo   Component     Latest Ref Rng 02/15/2016  Microalb, Ur     0.0 - 1.9 mg/dL <0.7  Creatinine,U      80.7  MICROALB/CREAT RATIO     0.0 - 30.0 mg/g 0.9   No microalbuminuria.

## 2016-02-15 NOTE — Patient Instructions (Signed)
Please continue: - Metformin 1000 mg 2x a day  - Invokana 300 mg before first meal of the day  - Lantus 30 units in am and 40 units in pm - NovoLog:  12 units with a snack  15 units for a smaller meal  20 units for a regular meal  25 units for a large meal Use 30 units if you have dessert or fried food.  Please return in 3 months with your sugar log.

## 2016-02-17 ENCOUNTER — Encounter: Payer: Self-pay | Admitting: Internal Medicine

## 2016-02-20 ENCOUNTER — Ambulatory Visit
Admission: RE | Admit: 2016-02-20 | Discharge: 2016-02-20 | Disposition: A | Discharge status: Home / Self Care | Payer: 59 | Source: Ambulatory Visit | Attending: Orthopedic Surgery | Admitting: Orthopedic Surgery

## 2016-02-20 DIAGNOSIS — M67912 Unspecified disorder of synovium and tendon, left shoulder: Secondary | ICD-10-CM

## 2016-02-24 ENCOUNTER — Encounter: Payer: Self-pay | Admitting: Gastroenterology

## 2016-03-06 ENCOUNTER — Other Ambulatory Visit (INDEPENDENT_AMBULATORY_CARE_PROVIDER_SITE_OTHER): Payer: 59

## 2016-03-06 ENCOUNTER — Telehealth: Payer: Self-pay

## 2016-03-06 DIAGNOSIS — E039 Hypothyroidism, unspecified: Secondary | ICD-10-CM

## 2016-03-06 LAB — TSH: TSH: 4.97 u[IU]/mL — ABNORMAL HIGH (ref 0.35–4.50)

## 2016-03-06 NOTE — Telephone Encounter (Signed)
Pt left /vm; the copay for the benicar has gone up and pt wants to know is there a generic for benicar. Pt request cb.

## 2016-03-07 ENCOUNTER — Other Ambulatory Visit: Payer: Self-pay

## 2016-03-07 ENCOUNTER — Encounter: Payer: Self-pay | Admitting: Gastroenterology

## 2016-03-07 ENCOUNTER — Ambulatory Visit (AMBULATORY_SURGERY_CENTER): Payer: 59 | Admitting: Gastroenterology

## 2016-03-07 ENCOUNTER — Encounter: Payer: 59 | Admitting: Gastroenterology

## 2016-03-07 VITALS — BP 126/70 | HR 64 | Temp 96.8°F | Resp 14 | Ht 70.0 in | Wt 251.0 lb

## 2016-03-07 DIAGNOSIS — D125 Benign neoplasm of sigmoid colon: Secondary | ICD-10-CM | POA: Diagnosis not present

## 2016-03-07 DIAGNOSIS — K219 Gastro-esophageal reflux disease without esophagitis: Secondary | ICD-10-CM

## 2016-03-07 DIAGNOSIS — K921 Melena: Secondary | ICD-10-CM | POA: Diagnosis present

## 2016-03-07 DIAGNOSIS — R1314 Dysphagia, pharyngoesophageal phase: Secondary | ICD-10-CM | POA: Diagnosis not present

## 2016-03-07 DIAGNOSIS — Z800 Family history of malignant neoplasm of digestive organs: Secondary | ICD-10-CM

## 2016-03-07 DIAGNOSIS — K635 Polyp of colon: Secondary | ICD-10-CM

## 2016-03-07 HISTORY — PX: COLONOSCOPY: SHX174

## 2016-03-07 LAB — GLUCOSE, CAPILLARY
GLUCOSE-CAPILLARY: 145 mg/dL — AB (ref 65–99)
GLUCOSE-CAPILLARY: 145 mg/dL — AB (ref 65–99)

## 2016-03-07 MED ORDER — LEVOTHYROXINE SODIUM 75 MCG PO TABS: ORAL_TABLET | ORAL | Status: DC

## 2016-03-07 MED ORDER — SODIUM CHLORIDE 0.9 % IV SOLN: 500.0000 mL | INTRAVENOUS | Status: DC

## 2016-03-07 NOTE — Progress Notes (Signed)
No egg or soy allergy known to patient  No issues with past sedation with any surgeries  or procedures, no intubation problems  No diet pills per patient No home 02 use per patient  No blood thinners per patient  Pt states no chronic issues with constipation , occ with vicodin use

## 2016-03-07 NOTE — Telephone Encounter (Signed)
Pt notified of Dr. Marliss Coots comments and verbalized understanding. He will check with the pharmacies to see if there is one that has the generic

## 2016-03-07 NOTE — Telephone Encounter (Signed)
I think there is - if it has been out long enough the cost should be lower (usually 6 mo after it goes generic) Have him check in with his pharmacy and if it is cheaper for him -please refill for a year with inst (generic please)

## 2016-03-07 NOTE — Op Note (Signed)
[place] Patient Name: Mitchell Palmer Procedure Date: 03/07/2016 8:01 AM MRN: [id number] Endoscopist: Ladene Artist , MD Age: 50 Referring MD:  Date of Birth: 19-Jan-1966 Gender: Male Account #: [id number] Procedure:                Colonoscopy Indications:              Evaluation of unexplained GI bleeding,                            Hematochezia. Family history of colon cancer-father                            age 63, Medicines:                Monitored Anesthesia Care Procedure:                Pre-Anesthesia Assessment:                           - Prior to the procedure, a History and Physical                            was performed, and patient medications and                            allergies were reviewed. The patient's tolerance of                            previous anesthesia was also reviewed. The risks                            and benefits of the procedure and the sedation                            options and risks were discussed with the patient.                            All questions were answered, and informed consent                            was obtained. Prior Anticoagulants: The patient has                            taken no previous anticoagulant or antiplatelet                            agents. ASA Grade Assessment: II - A patient with                            mild systemic disease. After reviewing the risks                            and benefits, the patient was deemed in  satisfactory condition to undergo the procedure.                           After obtaining informed consent, the colonoscope                            was passed under direct vision. Throughout the                            procedure, the patient's blood pressure, pulse, and                            oxygen saturations were monitored continuously. The                            Model PCF-H190DL [id number]) scope was introduced                    through the anus and advanced to the the cecum,                            identified by appendiceal orifice and ileocecal                            valve. The ileocecal valve, appendiceal orifice,                            and rectum were photographed. The quality of the                            bowel preparation was excellent. The colonoscopy                            was performed without difficulty. The patient                            tolerated the procedure well. Scope In: 8:22:15 AM Scope Out: 8:35:43 AM Scope Withdrawal Time: 0 hours 10 minutes 1 second  Total Procedure Duration: 0 hours 13 minutes 28 seconds  Findings:                 Two sessile polyps were found in the sigmoid colon.                            The polyps were 4 to 5 mm in size. These polyps                            were removed with a cold biopsy forceps. Resection                            and retrieval were complete.                           Internal hemorrhoids were found during  retroflexion. The hemorrhoids were medium-sized and                            Grade I (internal hemorrhoids that do not prolapse).                           The exam was otherwise without abnormality on                            direct and retroflexion views. Complications:            No immediate complications. Estimated blood loss:                            None. Estimated Blood Loss:     Estimated blood loss: none. Impression:               - Two 4 to 5 mm polyps in the sigmoid colon,                            removed with a cold biopsy forceps. Resected and                            retrieved.                           - Internal hemorrhoids.                           - The examination was otherwise normal on direct                            and retroflexion views. Recommendation:           - Repeat colonoscopy in 5 years for screening                             purposes.                           - Patient has a contact number available for                            emergencies. The signs and symptoms of potential                            delayed complications were discussed with the                            patient. Return to normal activities tomorrow.                            Written discharge instructions were provided to the                            patient.                           -  Resume previous diet.                           - Continue present medications.                           - Await pathology results.                           - Preparation H supp daily as needed Ladene Artist, MD 03/07/2016 8:40:05 AM This report has been signed electronically.

## 2016-03-07 NOTE — Patient Instructions (Addendum)
YOU HAD AN ENDOSCOPIC PROCEDURE TODAY AT [facility]:   Refer to the procedure report that was given to you for any specific questions about what was found during the examination.  If the procedure report does not answer your questions, please call your gastroenterologist to clarify.  If you requested that your care partner not be given the details of your procedure findings, then the procedure report has been included in a sealed envelope for you to review at your convenience later.  YOU SHOULD EXPECT: Some feelings of bloating in the abdomen. Passage of more gas than usual.  Walking can help get rid of the air that was put into your GI tract during the procedure and reduce the bloating. If you had a lower endoscopy (such as a colonoscopy or flexible sigmoidoscopy) you may notice spotting of blood in your stool or on the toilet paper. If you underwent a bowel prep for your procedure, you may not have a normal bowel movement for a few days.  Please Note:  You might notice some irritation and congestion in your nose or some drainage.  This is from the oxygen used during your procedure.  There is no need for concern and it should clear up in a day or so.  SYMPTOMS TO REPORT IMMEDIATELY:   Following lower endoscopy (colonoscopy or flexible sigmoidoscopy):  Excessive amounts of blood in the stool  Significant tenderness or worsening of abdominal pains  Swelling of the abdomen that is new, acute  Fever of 100F or higher   Following upper endoscopy (EGD)  Vomiting of blood or coffee ground material  New chest pain or pain under the shoulder blades  Painful or persistently difficult swallowing  New shortness of breath  Fever of 100F or higher  Black, tarry-looking stools  For urgent or emergent issues, a gastroenterologist can be reached at any hour by calling [phone].   DIET:  Follow Dilation Diet.  ACTIVITY:  You should plan to take it easy for the rest of today and  you should NOT DRIVE or use heavy machinery until tomorrow (because of the sedation medicines used during the test).    FOLLOW UP: Our staff will call the number listed on your records the next business day following your procedure to check on you and address any questions or concerns that you may have regarding the information given to you following your procedure. If we do not reach you, we will leave a message.  However, if you are feeling well and you are not experiencing any problems, there is no need to return our call.  We will assume that you have returned to your regular daily activities without incident.  If any biopsies were taken you will be contacted by phone or by letter within the next 1-3 weeks.  Please call us at [phone] if you have not heard about the biopsies in 3 weeks.    SIGNATURES/CONFIDENTIALITY: You and/or your care partner have signed paperwork which will be entered into your electronic medical record.  These signatures attest to the fact that that the information above on your After Visit Summary has been reviewed and is understood.  Full responsibility of the confidentiality of this discharge information lies with you and/or your care-partner.   Resume medications. Information given on polyps,hemorrhoids,high fiber diet and dilation diet.

## 2016-03-07 NOTE — Op Note (Signed)
[place] Patient Name: Mitchell Palmer Procedure Date: 03/07/2016 8:37 AM MRN: [id number] Endoscopist: Ladene Artist , MD Age: 50 Referring MD:  Date of Birth: [birth year] Gender: Male Account #: [id number] Procedure:                Upper GI endoscopy Indications:              Dysphagia, Gastro-esophageal reflux disease Medicines:                Monitored Anesthesia Care Procedure:                Pre-Anesthesia Assessment:                           - Prior to the procedure, a History and Physical                            was performed, and patient medications and                            allergies were reviewed. The patient's tolerance of                            previous anesthesia was also reviewed. The risks                            and benefits of the procedure and the sedation                            options and risks were discussed with the patient.                            All questions were answered, and informed consent                            was obtained. Prior Anticoagulants: The patient has                            taken no previous anticoagulant or antiplatelet                            agents. ASA Grade Assessment: II - A patient with                            mild systemic disease. After reviewing the risks                            and benefits, the patient was deemed in                            satisfactory condition to undergo the procedure.                           - Prior to the procedure, a History and Physical  was performed, and patient medications and                            allergies were reviewed. The patient's tolerance of                            previous anesthesia was also reviewed. The risks                            and benefits of the procedure and the sedation                            options and risks were discussed with the patient.                            All questions were  answered, and informed consent                            was obtained. Prior Anticoagulants: The patient has                            taken no previous anticoagulant or antiplatelet                            agents. ASA Grade Assessment: II - A patient with                            mild systemic disease. After reviewing the risks                            and benefits, the patient was deemed in                            satisfactory condition to undergo the procedure.                           After obtaining informed consent, the endoscope was                            passed under direct vision. Throughout the                            procedure, the patient's blood pressure, pulse, and                            oxygen saturations were monitored continuously. The                            Model GIF-HQ190 [id number]) scope was introduced                            through the mouth, and advanced to the second part  of duodenum. The upper GI endoscopy was                            accomplished without difficulty. The patient                            tolerated the procedure well. Scope In: Scope Out: Findings:                 One moderate benign-appearing, intrinsic stenosis                            was found at the gastroesophageal junction. This                            measured 1.3 cm (inner diameter) and was traversed.                            A guidewire was placed and the scope was withdrawn.                            Dilation was performed with a Savary dilator with                            mild resistance at 14 mm, 15 mm and 16 mm. No heme                            noted.                           The exam of the esophagus was otherwise normal.                           The entire examined stomach was normal.                           The duodenal bulb and second portion of the                            duodenum were  normal. Complications:            No immediate complications. Estimated Blood Loss:     Estimated blood loss: none. Impression:               - Benign-appearing esophageal stenosis. Dilated.                           - Normal stomach.                           - Normal duodenal bulb and second portion of the                            duodenum.                           - No  specimens collected. Recommendation:           - Patient has a contact number available for                            emergencies. The signs and symptoms of potential                            delayed complications were discussed with the                            patient. Return to normal activities tomorrow.                            Written discharge instructions were provided to the                            patient.                           - Clear liquid diet for 2 hours then advance as                            tolerated to soft diet.                           - Continue present medications.                           - Office appt in 1 year and prn Ladene Artist, MD 03/07/2016 9:07:12 AM This report has been signed electronically.

## 2016-03-07 NOTE — Progress Notes (Signed)
To pacu  At [facility]. Report to RN

## 2016-03-07 NOTE — Progress Notes (Signed)
Called to room to assist during endoscopic procedure.  Patient ID and intended procedure confirmed with present staff. Received instructions for my participation in the procedure from the performing physician.  

## 2016-03-08 ENCOUNTER — Telehealth: Payer: Self-pay | Admitting: *Deleted

## 2016-03-08 NOTE — Telephone Encounter (Signed)
Left message for patient to call with any questions or concerns. SM

## 2016-03-14 ENCOUNTER — Other Ambulatory Visit: Payer: Self-pay | Admitting: Internal Medicine

## 2016-03-15 ENCOUNTER — Encounter: Payer: Self-pay | Admitting: Gastroenterology

## 2016-04-24 ENCOUNTER — Other Ambulatory Visit (INDEPENDENT_AMBULATORY_CARE_PROVIDER_SITE_OTHER): Payer: 59

## 2016-04-24 DIAGNOSIS — E039 Hypothyroidism, unspecified: Secondary | ICD-10-CM | POA: Diagnosis not present

## 2016-04-24 LAB — TSH: TSH: 3.05 u[IU]/mL (ref 0.35–4.50)

## 2016-04-25 ENCOUNTER — Other Ambulatory Visit: Payer: Self-pay | Admitting: Family Medicine

## 2016-05-14 ENCOUNTER — Other Ambulatory Visit: Payer: Self-pay | Admitting: Family Medicine

## 2016-05-15 ENCOUNTER — Encounter: Payer: Self-pay | Admitting: Internal Medicine

## 2016-05-15 ENCOUNTER — Ambulatory Visit (INDEPENDENT_AMBULATORY_CARE_PROVIDER_SITE_OTHER): Payer: 59 | Admitting: Internal Medicine

## 2016-05-15 VITALS — BP 121/80 | HR 103 | Ht 69.0 in | Wt 247.0 lb

## 2016-05-15 DIAGNOSIS — E1122 Type 2 diabetes mellitus with diabetic chronic kidney disease: Secondary | ICD-10-CM

## 2016-05-15 DIAGNOSIS — Z23 Encounter for immunization: Secondary | ICD-10-CM

## 2016-05-15 DIAGNOSIS — Z794 Long term (current) use of insulin: Secondary | ICD-10-CM

## 2016-05-15 DIAGNOSIS — N183 Chronic kidney disease, stage 3 (moderate): Secondary | ICD-10-CM

## 2016-05-15 DIAGNOSIS — IMO0002 Reserved for concepts with insufficient information to code with codable children: Secondary | ICD-10-CM

## 2016-05-15 DIAGNOSIS — E1165 Type 2 diabetes mellitus with hyperglycemia: Secondary | ICD-10-CM | POA: Diagnosis not present

## 2016-05-15 LAB — POCT GLYCOSYLATED HEMOGLOBIN (HGB A1C): HEMOGLOBIN A1C: 7.7

## 2016-05-15 MED ORDER — INSULIN LISPRO 100 UNIT/ML (KWIKPEN): PEN_INJECTOR | SUBCUTANEOUS | 2 refills | Status: DC

## 2016-05-15 NOTE — Patient Instructions (Signed)
Please continue: - Metformin 1000 mg 2x a day - Invokana 300 mg before first meal of the day - Basaglar 30 units in am and 40 units in pm  Please increase: - Humalog:  15 units with a snack 30 units for a regular meal   35 units for a larger meal - if you have dessert or fried food  Please come back for a follow-up appointment in 3 months with your log.

## 2016-05-15 NOTE — Addendum Note (Signed)
Addended by: Nile Riggs on: 05/15/2016 04:58 PM   Modules accepted: Orders

## 2016-05-15 NOTE — Addendum Note (Signed)
Addended by: Nile Riggs on: 05/15/2016 04:55 PM   Modules accepted: Orders

## 2016-05-15 NOTE — Progress Notes (Signed)
Subjective:     Patient ID: Mitchell Palmer, male   DOB: 01-25-1966, 50 y.o.   MRN: [id number]  Diabetes   Mitchell Palmer is a 50 y.o. man, returning for followup for DM2, dx in 2000, uncontrolled, insulin-dependent, with complications (CKD stage 3, erectile dysfunction, mild DR). Last visit 3 mo ago.  He had L rotator cuff Sx 03/13/2016.  His last hemoglobin A1c was: Lab Results  Component Value Date   HGBA1C 7.8 02/15/2016   HGBA1C 7.7 11/08/2015   HGBA1C 8.0 (H) 07/22/2015  He had steroid inj's in the past.  He is on a regimen of - free meds: - Metformin 1000 mg 2x a day - Invokana 300 mg before first meal of the day - Basaglar 30 units in am and 40 units in pm (viceversa when not working) - Humalog:  12 units with a snack 15 units for a smaller meal 20 units for a regular meal  25 units for a large meal   30 units if you have dessert or fried food. (now takes this with lunch and supper) He was on: - Cycloset - started 04/22/2013 >> at 1.6 mg daily (tried 3 tabs >> dizziness >> backed off to 2) - Victoza 1.8 mg daily - Actos 45 mg daily - Januvia - glyburide/metformin - Avandia. - Bydureon 2 mg weekly - he hated the thick needle   He is checking his sugars 2-3 a day (he has a One Touch ultra meter). He brought his log today: - am: 100-162, 186, 267 >> 71, 93-160, 210 >> 86, 140-149 (if 120s, he does not take the Basaglar) >> 90-162, 173 - 2 hours after b'fast: 91-231 >> 180-220 >> 178-216 >> n/c >>114-194, 202 >> 150-214 >> n/c >> 120-190 - before lunch: 73-120, 162 >> 132, 171 >> 120-170 (higher if eats a cookie at night if sugars in to 80s) >> 120-163, 217 - 2 hours after lunch: 123, 222, 259 >> n/c >> 147-180, 288 >> 95, 135-217 >> 150-240 >> 170-232, 266 - Before dinner: 196, 200 >> n/c >> 107, 121-250 >> 106-190, 233 >> 95, 122-209, 224 >> 140s >> 160-190 - bedtime up to 300 (pizza) >> 180s >> n/c >> 125-158 (cut down on pizza) >> 175-250 >> 124, 183, 190 >> 140s >>  160-214 - nighttimeL 168 Lowest: 75 >> 86 >> 89. He has hypoglycemia awareness ~80. Highest 310 >> 288 >> 349 (forgot insulin) >> 266.  He works nights: 6 pm - 7:30 am. Works 3 days a week.   Meals: - 5:30-6 pm (at work): 2 bread slices + meat - snack at 9-10 pm: pistachios or 1/2 sandwich - 4:30 am: coffee - 11 am-12 pm: salad, meat + veggies + rice (leftovers from take out)  - Has HL (increased TG). Last lipid panel: Lab Results  Component Value Date   CHOL 133 01/23/2016   HDL 35.50 (L) 01/23/2016   LDLCALC 62 09/30/2013   LDLDIRECT 59.0 01/23/2016   TRIG 226.0 (H) 01/23/2016   CHOLHDL 4 01/23/2016  He is on Atorvastatin. - last eye exam was with Dr. Katy Fitch in 09/2015, mild retinopathy, L>R  - No numbness or tingling in his legs. Foot exam performed 11/2014: normal - He does have chronic kidney disease - stage 2, last BUN/Cr:  Lab Results  Component Value Date   BUN 23 01/23/2016   CREATININE 1.12 01/23/2016   Component     Latest Ref Rng 02/15/2016  Microalb, Ur     0.0 -  1.9 mg/dL <0.7  Creatinine,U      80.7  MICROALB/CREAT RATIO     0.0 - 30.0 mg/g 0.9   He is on Olmesartan.  He also has a history of hyperlipidemia, hypertension, GERD, esophageal stricture, plantar fasciitis, eustachian tube dysfunction, status post ear surgery, history of increased alcohol use.  He has hypothyroidism - managed by PCP. On LT4 50 mcg daily - started 01/23/2016.  Lab Results  Component Value Date   TSH 3.05 04/24/2016   Review of Systems Constitutional: no weight gain, no fatigue Eyes: no blurry vision, no xerophthalmia ENT: no sore throat, no nodules palpated in throat, no dysphagia/odynophagia, no hoarseness Cardiovascular: no CP/SOB/palpitations/leg swelling Respiratory: no cough/SOB Gastrointestinal: no N/V/D/C Musculoskeletal: no muscle aches/no joint aches/joint swelling Skin: no rashes  I reviewed pt's medications, allergies, PMH, social hx, family hx, and changes  were documented in the history of present illness. Otherwise, unchanged from my initial visit note.   Objective:   Physical Exam BP 121/80   Pulse (!) 103   Ht 5\' 9"  (1.753 m)   Wt 247 lb (112 kg)   BMI 36.48 kg/m  Body mass index is 36.48 kg/m.   Wt Readings from Last 3 Encounters:  05/15/16 247 lb (112 kg)  03/07/16 251 lb (113.9 kg)  02/15/16 253 lb (114.8 kg)  Constitutional: obese, in NAD Eyes: PERRLA, EOMI, no exophthalmos ENT: moist mucous membranes, no thyromegaly, no cervical lymphadenopathy Cardiovascular: tachycardia, RR, No MRG Respiratory: CTA B Gastrointestinal: abdomen soft, NT, ND, BS+ Musculoskeletal: no deformities, strength intact in all 4 Skin: moist, warm, no rashes  Assessment:     1. 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 Sugars are similar with last visit, despite increasing Novolog insulin with meals. We will increase this further. He refuses to retry Victoza for now. Agrees to work on his diet.  - I advised him to: Patient Instructions  Please continue: - Metformin 1000 mg 2x a day  - Invokana 300 mg before first meal of the day  - Lantus 30 units in am and 40 units in pm - NovoLog:  12 units with a snack  15 units for a smaller meal  20 units for a regular meal  25 units for a large meal  30 units if you have dessert or fried food.  Please return in 3 months with your sugar log.   - given flu shot  - advised for yearly eye exams >> UTD - reviewed last hba1c >> 7.7% (stable, high) - RTC in 3 mo   Philemon Kingdom, MD PhD [facility]

## 2016-05-21 ENCOUNTER — Other Ambulatory Visit: Payer: Self-pay | Admitting: Family Medicine

## 2016-06-07 ENCOUNTER — Other Ambulatory Visit: Payer: Self-pay | Admitting: Internal Medicine

## 2016-07-09 ENCOUNTER — Other Ambulatory Visit: Payer: Self-pay | Admitting: Family Medicine

## 2016-07-14 ENCOUNTER — Other Ambulatory Visit: Payer: Self-pay | Admitting: Family Medicine

## 2016-07-31 ENCOUNTER — Other Ambulatory Visit: Payer: Self-pay | Admitting: Internal Medicine

## 2016-08-15 ENCOUNTER — Other Ambulatory Visit: Payer: Self-pay | Admitting: Internal Medicine

## 2016-08-15 ENCOUNTER — Encounter: Payer: Self-pay | Admitting: Internal Medicine

## 2016-08-15 ENCOUNTER — Ambulatory Visit (INDEPENDENT_AMBULATORY_CARE_PROVIDER_SITE_OTHER): Payer: 59 | Admitting: Family Medicine

## 2016-08-15 ENCOUNTER — Encounter: Payer: Self-pay | Admitting: Family Medicine

## 2016-08-15 ENCOUNTER — Ambulatory Visit (INDEPENDENT_AMBULATORY_CARE_PROVIDER_SITE_OTHER): Payer: 59 | Admitting: Internal Medicine

## 2016-08-15 VITALS — BP 144/82 | HR 83 | Temp 98.3°F | Ht 69.0 in | Wt 249.4 lb

## 2016-08-15 VITALS — BP 132/70 | HR 63 | Wt 249.0 lb

## 2016-08-15 DIAGNOSIS — N183 Chronic kidney disease, stage 3 (moderate): Secondary | ICD-10-CM

## 2016-08-15 DIAGNOSIS — M25561 Pain in right knee: Secondary | ICD-10-CM

## 2016-08-15 DIAGNOSIS — E1165 Type 2 diabetes mellitus with hyperglycemia: Secondary | ICD-10-CM

## 2016-08-15 DIAGNOSIS — E1122 Type 2 diabetes mellitus with diabetic chronic kidney disease: Secondary | ICD-10-CM

## 2016-08-15 DIAGNOSIS — Z794 Long term (current) use of insulin: Secondary | ICD-10-CM | POA: Diagnosis not present

## 2016-08-15 DIAGNOSIS — IMO0002 Reserved for concepts with insufficient information to code with codable children: Secondary | ICD-10-CM

## 2016-08-15 LAB — POCT GLYCOSYLATED HEMOGLOBIN (HGB A1C): Hemoglobin A1C: 7.6

## 2016-08-15 MED ORDER — GLUCOSE BLOOD VI STRP: ORAL_STRIP | 11 refills | Status: DC

## 2016-08-15 MED ORDER — METHYLPREDNISOLONE ACETATE 40 MG/ML IJ SUSP
80.0000 mg | Freq: Once | INTRAMUSCULAR | Status: AC
  Administered 2016-08-15: 80 mg via INTRAMUSCULAR

## 2016-08-15 NOTE — Patient Instructions (Addendum)
Please continue: - Metformin 1000 mg 2x a day - Invokana 300 mg before first meal of the day - Basaglar 30 units in am and 40 units in pm - Humalog:  15 units with a snack 30 units for a regular meal                         35 units for a larger meal - if you have dessert or fried food  Please start a light exercise program.  Please reduce meat and cheese and increase veggies an fruit.  Please come back for a follow-up appointment in 3 months with your log.

## 2016-08-15 NOTE — Progress Notes (Signed)
Subjective:     Patient ID: Mitchell Palmer, male   DOB: 1966/07/31, 50 y.o.   MRN: [id number]  Diabetes   Mr. Mitchell Palmer is a 50 y.o. man, returning for followup for DM2, dx in 2000, uncontrolled, insulin-dependent, with complications (CKD stage 3, erectile dysfunction, mild DR). Last visit 3 mo ago.  His last hemoglobin A1c was: Lab Results  Component Value Date   HGBA1C 7.7 05/15/2016   HGBA1C 7.8 02/15/2016   HGBA1C 7.7 11/08/2015  He had steroid inj's in the past.  He is on a regimen of - free meds: - Metformin 1000 mg 2x a day - Invokana 300 mg before first meal of the day - Basaglar 30 units in am and 40 units in pm (viceversa when not working) - Humalog:  15 units with a snack 30 units for a regular meal                         35 units for a larger meal - if you have dessert or fried food He was on: - Cycloset - started 04/22/2013 >> at 1.6 mg daily (tried 3 tabs >> dizziness >> backed off to 2) - Victoza 1.8 mg daily - Actos 45 mg daily - Januvia - glyburide/metformin - Avandia. - Bydureon 2 mg weekly - he hated the thick needle   He is checking his sugars 2-3 a day (he has a One Touch ultra meter). He forgot log: - am: 86, 140-149 (if 120s, he does not take the Basaglar) >> 90-162, 173 >> 120-136 - 2 hours after b'fast: 114-194, 202 >> 150-214 >> n/c >> 120-190 >> n/c - before lunch: 73-120, 162 >> 132, 171 >> 120-170 >> 120-163, 217 >> 150s - 2 hours after lunch: 147-180, 288 >> 95, 135-217 >> 150-240 >> 170-232, 266 >> 175 - Before dinner: 106-190, 233 >> 95, 122-209, 224 >> 140s >> 160-190 >> 200  - bedtime: 175-250 >> 124, 183, 190 >> 140s >> 160-214 >> 180s - nighttime: 168 Lowest: 89 >> 80  He has hypoglycemia awareness ~80.  Highest 266 >> 250.  He works nights: 6 pm - 7:30 am. Restarted work 1 mo ago, after his surgery, 3.5 days a week.  Meals: - 5:30-6 pm (at work): 2 bread slices + meat - snack at 9-10 pm: pistachios or 1/2 sandwich - 4:30 am:  coffee - 11 am-12 pm: salad, meat + veggies + rice (leftovers from take out)  - Has HL (increased TG). Last lipid panel: Lab Results  Component Value Date   CHOL 133 01/23/2016   HDL 35.50 (L) 01/23/2016   LDLCALC 62 09/30/2013   LDLDIRECT 59.0 01/23/2016   TRIG 226.0 (H) 01/23/2016   CHOLHDL 4 01/23/2016  He is on Atorvastatin. - last eye exam was with Dr. Katy Palmer in 09/2015, mild retinopathy, L>R  - No numbness or tingling in his legs. Foot exam performed 11/2014: normal - He does have chronic kidney disease - stage 2, last BUN/Cr:  Lab Results  Component Value Date   BUN 23 01/23/2016   CREATININE 1.12 01/23/2016   Component     Latest Ref Rng 02/15/2016  Microalb, Ur     0.0 - 1.9 mg/dL <0.7  Creatinine,U      80.7  MICROALB/CREAT RATIO     0.0 - 30.0 mg/g 0.9  He is on Olmesartan.  He also has a history of hyperlipidemia, hypertension, GERD, esophageal stricture, plantar fasciitis, eustachian tube dysfunction, status  post ear surgery, history of increased alcohol use.  He has hypothyroidism - managed by PCP. On LT4 50 mcg daily - started 01/23/2016.  Lab Results  Component Value Date   TSH 3.05 04/24/2016   Review of Systems Constitutional: no weight gain, no fatigue Eyes: no blurry vision, no xerophthalmia ENT: no sore throat, no nodules palpated in throat, no dysphagia/odynophagia, no hoarseness Cardiovascular: no CP/SOB/palpitations/leg swelling Respiratory: no cough/SOB Gastrointestinal: no N/V/D/C Musculoskeletal: no muscle aches/+ joint aches Skin: no rashes  I reviewed pt's medications, allergies, PMH, social hx, family hx, and changes were documented in the history of present illness. Otherwise, unchanged from my initial visit note.   Objective:   Physical Exam BP 132/70   Pulse 63   Wt 249 lb (112.9 kg)   BMI 36.77 kg/m  Body mass index is 36.77 kg/m.   Wt Readings from Last 3 Encounters:  08/15/16 249 lb (112.9 kg)  05/15/16 247 lb (112 kg)   03/07/16 251 lb (113.9 kg)  Constitutional: obese, in NAD Eyes: PERRLA, EOMI, no exophthalmos ENT: moist mucous membranes, no thyromegaly, no cervical lymphadenopathy Cardiovascular: RRR, No MRG Respiratory: CTA B Gastrointestinal: abdomen soft, NT, ND, BS+ Musculoskeletal: no deformities, strength intact in all 4 Skin: moist, warm, no rashes  Assessment:     1. 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 Sugars are a little better than last visit, after again increasing Novolog insulin with meals.  - We discussed not to increase this further, but to start adding exercise in his schedule and also to start working on his diet.  - I advised him to: Patient Instructions  Please continue: - Metformin 1000 mg 2x a day - Invokana 300 mg before first meal of the day - Basaglar 30 units in am and 40 units in pm - Humalog:  15 units with a snack 30 units for a regular meal                         35 units for a larger meal - if you have dessert or fried food  Please start a light exercise program.  Please reduce meat and cheese and increase veggies an fruit.  Please come back for a follow-up appointment in 3 months with your log.  - given flu shot at last visit - advised for yearly eye exams >> UTD - reviewed last hba1c >> 7.6% (stable, high) - RTC in 3 mo   Mitchell Kingdom, MD PhD [facility]

## 2016-08-15 NOTE — Progress Notes (Signed)
Dr. Frederico Hamman T. Shandiin Eisenbeis, MD, [facility] [place], [address] Phone: [phone] Fax: [phone]  08/15/2016  Patient: Mitchell Palmer, MRN: [id number], DOB: 1966-06-12, 50 y.o.  Primary Physician:  Loura Pardon, MD   Chief Complaint  Patient presents with  . Knee Pain    right knee   Subjective:   Mitchell Palmer is a 50 y.o. very pleasant male patient who presents with the following:  Rrecently, the patient started [employer] at work, and he is going up upwards of 80 steps at a time multiple times a day.  This is starting to aggravate his right knee before.  I seen this before  Previously with him, and we had aspirated some fluid in the past.  Last time I did this was approximately 2-1/2 years ago.  He also recently installed an HVAC system at his house. HVAC  R knee asp and inject. 10 cc  Last done 2 1/2 years ago  Past Medical History, Surgical History, Social History, Family History, Problem List, Medications, and Allergies have been reviewed and updated if relevant.  Patient Active Problem List   Diagnosis Date Noted  . Obesity 01/24/2016  . Hypothyroidism 01/24/2016  . Uncontrolled type 2 diabetes mellitus with stage 3 chronic kidney disease, with long-term current use of insulin ([place]) 07/28/2015  . Alcohol consumption heavy 12/06/2014  . Swelling, mass, or lump in head and neck 06/30/2014  . Necrobiosis diabeticorum ([place]) 03/01/2014  . Low back pain 06/17/2012  . Abnormal chest x-ray 10/15/2011  . PLANTAR FASCIITIS, TRAUMATIC 11/09/2009  . NEOPLASM UNCERTAIN BEHAVIOR OTHER SPEC SITES 10/07/2009  . ESOPHAGEAL STRICTURE 06/27/2009  . GERD 03/31/2009  . OTHER DYSPHAGIA 03/31/2009  . Hyperlipidemia 01/06/2009  . Essential hypertension 01/06/2009  . ALLERGIC RHINITIS 01/06/2009    Past Medical History:  Diagnosis Date  . Allergy    allegic rhinitis  . Diabetes mellitus    type II  . Esophageal  stricture   . GERD (gastroesophageal reflux disease)   . History of hernia repair    as a baby  . Hyperlipidemia   . Hyperplastic polyp of intestine 2010  . Hypertension   . S/P ear surgery, follow-up exam    for drainage    Past Surgical History:  Procedure Laterality Date  . COLONOSCOPY    . ESOPHAGOGASTRODUODENOSCOPY  04/2009   stricture/GERD  . HERNIA REPAIR     age 73-midline  . [place]   went in behind rt ear-blockage  . POLYPECTOMY    . SHOULDER ARTHROSCOPY WITH ROTATOR CUFF REPAIR AND SUBACROMIAL DECOMPRESSION Right 09/29/2012   Procedure: SHOULDER ARTHROSCOPY WITH ROTATOR CUFF REPAIR AND SUBACROMIAL DECOMPRESSION;  Surgeon: Nita Sells, MD;  Location: [facility];  Service: Orthopedics;  Laterality: Right;  Right shoulder arthroscopy with subacromial decompression and distal clavicle excision & Debridement of Labrial Tear  . UPPER GASTROINTESTINAL ENDOSCOPY      Social History   Social History  . Marital status: Single    Spouse name: N/A  . Number of children: N/A  . Years of education: N/A   Occupational History  . Operator    Social History Main Topics  . Smoking status: Never Smoker  . Smokeless tobacco: Never Used  . Alcohol use 7.2 oz/week    12 Standard drinks or equivalent per week     Comment: 6 pk every 3 days; 12 pk/week  . Drug use: No  .  Sexual activity: Not on file   Other Topics Concern  . Not on file   Social History Narrative  . No narrative on file    Family History  Problem Relation Age of Onset  . Diabetes Mother   . Colon cancer Father 5  . Diabetes Maternal Aunt   . Cancer Cousin     breast cancer  . Diabetes Other   . Colon polyps Brother     Allergies  Allergen Reactions  . Codeine     REACTION: anxious    Medication list reviewed and updated in full in [place].  GEN: No fevers, chills. Nontoxic. Primarily MSK c/o today. MSK: Detailed in the HPI GI: tolerating  PO intake without difficulty Neuro: No numbness, parasthesias, or tingling associated. Otherwise the pertinent positives of the ROS are noted above.   Objective:   BP (!) 144/82   Pulse 83   Temp 98.3 F (36.8 C) (Oral)   Ht 5\' 9"  (1.753 m)   Wt 249 lb 6.4 oz (113.1 kg)   SpO2 96%   BMI 36.83 kg/m    GEN: WDWN, NAD, Non-toxic, Alert & Oriented x 3 HEENT: Atraumatic, Normocephalic.  Ears and Nose: No external deformity. EXTR: No clubbing/cyanosis/edema NEURO: Normal gait.  PSYCH: Normally interactive. Conversant. Not depressed or anxious appearing.  Calm demeanor.   Knee:  R Gait: Normal heel toe pattern, antalgia ROM: 0-115 Effusion: mild Echymosis or edema: none Patellar tendon NT Painful PLICA: neg Patellar grind: negative Medial and lateral patellar facet loading: negative medial and lateral joint lines: mild medial joint line pain Mcmurray's pain Flexion-pinch pain Varus and valgus stress: stable Lachman: neg Ant and Post drawer: neg Hip abduction, IR, ER: WNL Hip flexion str: 5/5 Hip abd: 5/5 Quad: 5/5 VMO atrophy:No Hamstring concentric and eccentric: 5/5   Radiology: No results found.  Assessment and Plan:   Right knee pain, unspecified chronicity - Plan: methylPREDNISolone acetate (DEPO-MEDROL) injection 80 mg  Uncontrolled type 2 diabetes mellitus with stage 3 chronic kidney disease, with long-term current use of insulin ([place])  Most likely he aggravated his knee with increased need demands and HVAC installation recently with some underlying arthritis.   We'll aspirate and inject his knee, and continue with over-the-counter medications and ice as needed.  Knee Aspiration and Injection, R Patient verbally consented; risks, benefits, and alternatives explained including possible infection. Patient prepped with Chloraprep. Ethyl chloride for anesthesia. 10 cc of 1% Lidocaine used in wheal then injected Subcutaneous fashion with 22 gauge needle on lateral  approach. Under sterilne conditions, 18 gauge needle used via lateral approach to aspirate 10 cc of serosanguinous fluid. Then 8 cc of Lidocaine 1% and 2 mL of Depo-Medrol 40 mg injected. Tolerated well, decreased pain, no complications.   Follow-up: No Follow-up on file.  Meds ordered this encounter  Medications  . methylPREDNISolone acetate (DEPO-MEDROL) injection 80 mg    Signed,  Draycen Leichter T. Elchanan Bob, MD   Allergies as of 08/15/2016      Reactions   Codeine    REACTION: anxious      Medication List       Accurate as of 08/15/16 11:59 PM. Always use your most recent med list.          atorvastatin 40 MG tablet Commonly known as:  LIPITOR TAKE 1 TABLET (40 MG TOTAL) BY MOUTH DAILY.   B-D INS SYR ULTRAFINE 1CC/31G 31G X 5/16" 1 ML Misc Generic drug:  Insulin Syringe-Needle U-100 INJECT 35 UNITS  INTO THE SKIN EVERY 12 HOURS AS DIRECTED BY PHYSICIAN.   B-D UF III MINI PEN NEEDLES 31G X 5 MM Misc Generic drug:  Insulin Pen Needle USE 4 TIMES A DAY   BASAGLAR KWIKPEN 100 UNIT/ML Sopn INJECT 40 UNITS UNDER SKIN AT THE BEGINNING OF YOUR DAY AND 30 UNITS AT BEDTIME   BENICAR 40 MG tablet Generic drug:  olmesartan TAKE 1 TABLET BY MOUTH EVERY DAY   cyclobenzaprine 10 MG tablet Commonly known as:  FLEXERIL TAKE 1 TABLET BY MOUTH 3 TIMES A DAY AS NEEDED   diclofenac 75 MG EC tablet Commonly known as:  VOLTAREN Reported on 02/15/2016   fluticasone 50 MCG/ACT nasal spray Commonly known as:  FLONASE USE 2 SPRAYS IN EACH NOSTRIL ONCE A DAY   glucose blood test strip Commonly known as:  ONE TOUCH ULTRA TEST TEST BLOOD SUGAR 4 TIMES A DAY AS DIRECTED   HYDROcodone-acetaminophen 5-325 MG tablet Commonly known as:  NORCO/VICODIN Take 1 tablet by mouth every 6 (six) hours as needed for moderate pain.   insulin lispro 100 UNIT/ML KiwkPen Commonly known as:  HUMALOG KWIKPEN 15-35 units into the skin 3 times w/ meals   HUMALOG KWIKPEN 100 UNIT/ML KiwkPen Generic drug:   insulin lispro INJECT 15-25 UNITS INTO THE SKIN 3 TIMES W/ MEALS, 10-12 UNITS INTO THE SKIN W/ SNACKS.   INVOKANA 300 MG Tabs tablet Generic drug:  canagliflozin TAKE 1 TABLET BY MOUTH DAILY BEFORE BREAKFAST   levothyroxine 75 MCG tablet Commonly known as:  SYNTHROID, LEVOTHROID TAKE 1 TABLET BY MOUTH ONCE A DAY ON AN EMPTY STOMACH 30 MINUTES BEFORE EATING   metFORMIN 1000 MG tablet Commonly known as:  GLUCOPHAGE TAKE 1 TABLET (1,000 MG TOTAL) BY MOUTH 2 (TWO) TIMES DAILY WITH A MEAL.   naproxen 500 MG tablet Commonly known as:  NAPROSYN TAKE 1 TABLET (500 MG TOTAL) BY MOUTH 2 (TWO) TIMES DAILY WITH A MEAL.   omeprazole 40 MG capsule Commonly known as:  PRILOSEC TAKE 1 CAPSULE (40 MG TOTAL) BY MOUTH DAILY.   tadalafil 20 MG tablet Commonly known as:  CIALIS Take 1 tablet (20 mg total) by mouth as directed.   traMADol 50 MG tablet Commonly known as:  ULTRAM   vitamin B-12 1000 MCG tablet Commonly known as:  CYANOCOBALAMIN Take 1,000 mcg by mouth daily.

## 2016-08-15 NOTE — Progress Notes (Signed)
Pre visit review using our clinic review tool, if applicable. No additional management support is needed unless otherwise documented below in the visit note. 

## 2016-08-22 ENCOUNTER — Ambulatory Visit: Payer: 59 | Admitting: Family Medicine

## 2016-08-23 DIAGNOSIS — Z4789 Encounter for other orthopedic aftercare: Secondary | ICD-10-CM | POA: Diagnosis not present

## 2016-08-23 DIAGNOSIS — M25512 Pain in left shoulder: Secondary | ICD-10-CM | POA: Diagnosis not present

## 2016-08-23 DIAGNOSIS — M75102 Unspecified rotator cuff tear or rupture of left shoulder, not specified as traumatic: Secondary | ICD-10-CM | POA: Diagnosis not present

## 2016-08-28 DIAGNOSIS — M25512 Pain in left shoulder: Secondary | ICD-10-CM | POA: Diagnosis not present

## 2016-08-28 DIAGNOSIS — M75102 Unspecified rotator cuff tear or rupture of left shoulder, not specified as traumatic: Secondary | ICD-10-CM | POA: Diagnosis not present

## 2016-08-28 DIAGNOSIS — Z4789 Encounter for other orthopedic aftercare: Secondary | ICD-10-CM | POA: Diagnosis not present

## 2016-09-07 ENCOUNTER — Encounter: Payer: Self-pay | Admitting: Family Medicine

## 2016-09-07 ENCOUNTER — Ambulatory Visit (INDEPENDENT_AMBULATORY_CARE_PROVIDER_SITE_OTHER): Payer: 59 | Admitting: Family Medicine

## 2016-09-07 VITALS — BP 124/76 | HR 75 | Temp 98.9°F | Ht 69.0 in | Wt 250.2 lb

## 2016-09-07 DIAGNOSIS — E1122 Type 2 diabetes mellitus with diabetic chronic kidney disease: Secondary | ICD-10-CM

## 2016-09-07 DIAGNOSIS — IMO0002 Reserved for concepts with insufficient information to code with codable children: Secondary | ICD-10-CM

## 2016-09-07 DIAGNOSIS — H6592 Unspecified nonsuppurative otitis media, left ear: Secondary | ICD-10-CM | POA: Diagnosis not present

## 2016-09-07 DIAGNOSIS — E1165 Type 2 diabetes mellitus with hyperglycemia: Secondary | ICD-10-CM | POA: Diagnosis not present

## 2016-09-07 DIAGNOSIS — Z794 Long term (current) use of insulin: Secondary | ICD-10-CM

## 2016-09-07 DIAGNOSIS — H1031 Unspecified acute conjunctivitis, right eye: Secondary | ICD-10-CM

## 2016-09-07 DIAGNOSIS — N183 Chronic kidney disease, stage 3 (moderate): Secondary | ICD-10-CM

## 2016-09-07 DIAGNOSIS — H109 Unspecified conjunctivitis: Secondary | ICD-10-CM | POA: Insufficient documentation

## 2016-09-07 MED ORDER — POLYMYXIN B-TRIMETHOPRIM 10000-0.1 UNIT/ML-% OP SOLN: 2.0000 [drp] | Freq: Four times a day (QID) | OPHTHALMIC | 0 refills | Status: DC

## 2016-09-07 MED ORDER — AMOXICILLIN-POT CLAVULANATE 875-125 MG PO TABS: 1.0000 | ORAL_TABLET | Freq: Two times a day (BID) | ORAL | 0 refills | Status: DC

## 2016-09-07 NOTE — Assessment & Plan Note (Signed)
L ear With uri  Cover with augmentin bid 7 d Disc symptomatic care - see instructions on AVS  Update if not starting to improve in a week or if worsening

## 2016-09-07 NOTE — Progress Notes (Signed)
Subjective:    Patient ID: Mitchell Palmer, male    DOB: 1966-01-28, 51 y.o.   MRN: [id number]  HPI Here for eye and ear complaints   Had suspected pink eye - right  Used polymixin B with trimethoprim   Had to leave work early wed am  R eye  Is getting better  Was red and a little swollen and discharge   Now he is having some cold symptoms  Both ears bothering him -worse on the R  Feels like numbness/ pressure / no sharp pains and a little drainage out of the R ear  Red and yellow d/c on q tip    Stuffy /runny nose  Yellow d/c Thinks he felt feverish yesterday- warm feeling  A little bit of chills but his glucose was low also   Cough- some/ from drainage  No wheezing or trouble breathing   Patient Active Problem List   Diagnosis Date Noted  . Obesity 01/24/2016  . Hypothyroidism 01/24/2016  . Uncontrolled type 2 diabetes mellitus with stage 3 chronic kidney disease, with long-term current use of insulin ([place]) 07/28/2015  . Alcohol consumption heavy 12/06/2014  . Swelling, mass, or lump in head and neck 06/30/2014  . Necrobiosis diabeticorum ([place]) 03/01/2014  . Low back pain 06/17/2012  . Abnormal chest x-ray 10/15/2011  . PLANTAR FASCIITIS, TRAUMATIC 11/09/2009  . NEOPLASM UNCERTAIN BEHAVIOR OTHER SPEC SITES 10/07/2009  . ESOPHAGEAL STRICTURE 06/27/2009  . GERD 03/31/2009  . OTHER DYSPHAGIA 03/31/2009  . Hyperlipidemia 01/06/2009  . Essential hypertension 01/06/2009  . ALLERGIC RHINITIS 01/06/2009   Past Medical History:  Diagnosis Date  . Allergy    allegic rhinitis  . Diabetes mellitus    type II  . Esophageal stricture   . GERD (gastroesophageal reflux disease)   . History of hernia repair    as a baby  . Hyperlipidemia   . Hyperplastic polyp of intestine 2010  . Hypertension   . S/P ear surgery, follow-up exam    for drainage   Past Surgical History:  Procedure Laterality Date  . COLONOSCOPY    . ESOPHAGOGASTRODUODENOSCOPY  04/2009   stricture/GERD  . HERNIA REPAIR     age 23-midline  . [place]   went in behind rt ear-blockage  . POLYPECTOMY    . SHOULDER ARTHROSCOPY WITH ROTATOR CUFF REPAIR AND SUBACROMIAL DECOMPRESSION Right 09/29/2012   Procedure: SHOULDER ARTHROSCOPY WITH ROTATOR CUFF REPAIR AND SUBACROMIAL DECOMPRESSION;  Surgeon: Nita Sells, MD;  Location: [facility];  Service: Orthopedics;  Laterality: Right;  Right shoulder arthroscopy with subacromial decompression and distal clavicle excision & Debridement of Labrial Tear  . UPPER GASTROINTESTINAL ENDOSCOPY     Social History  Substance Use Topics  . Smoking status: Never Smoker  . Smokeless tobacco: Never Used  . Alcohol use 7.2 oz/week    12 Standard drinks or equivalent per week     Comment: 6 pk every 3 days; 12 pk/week   Family History  Problem Relation Age of Onset  . Diabetes Mother   . Colon cancer Father 74  . Diabetes Maternal Aunt   . Cancer Cousin     breast cancer  . Diabetes Other   . Colon polyps Brother    Allergies  Allergen Reactions  . Codeine     REACTION: anxious   Current Outpatient Prescriptions on File Prior to Visit  Medication Sig Dispense Refill  . atorvastatin (LIPITOR) 40 MG tablet TAKE 1 TABLET (40  MG TOTAL) BY MOUTH DAILY. 30 tablet 5  . B-D INS SYR ULTRAFINE 1CC/31G 31G X 5/16" 1 ML MISC INJECT 35 UNITS INTO THE SKIN EVERY 12 HOURS AS DIRECTED BY PHYSICIAN. 200 each 2  . B-D UF III MINI PEN NEEDLES 31G X 5 MM MISC USE 4 TIMES A DAY 300 each 4  . BENICAR 40 MG tablet TAKE 1 TABLET BY MOUTH EVERY DAY 30 tablet 5  . cyclobenzaprine (FLEXERIL) 10 MG tablet TAKE 1 TABLET BY MOUTH 3 TIMES A DAY AS NEEDED 40 tablet 1  . diclofenac (VOLTAREN) 75 MG EC tablet Reported on 02/15/2016    . fluticasone (FLONASE) 50 MCG/ACT nasal spray USE 2 SPRAYS IN EACH NOSTRIL ONCE A DAY 16 g 5  . glucose blood (ONE TOUCH ULTRA TEST) test strip TEST BLOOD SUGAR 4 TIMES A DAY AS DIRECTED 200 each 11    . HUMALOG KWIKPEN 100 UNIT/ML KiwkPen INJECT 15-25 UNITS INTO THE SKIN 3 TIMES W/ MEALS, 10-12 UNITS INTO THE SKIN W/ SNACKS. 30 mL 2  . HYDROcodone-acetaminophen (NORCO/VICODIN) 5-325 MG per tablet Take 1 tablet by mouth every 6 (six) hours as needed for moderate pain. 40 tablet 0  . Insulin Glargine (BASAGLAR KWIKPEN) 100 UNIT/ML SOPN INJECT 40 UNITS UNDER SKIN AT THE BEGINNING OF YOUR DAY AND 30 UNITS AT BEDTIME 30 mL 5  . insulin lispro (HUMALOG KWIKPEN) 100 UNIT/ML KiwkPen 15-35 units into the skin 3 times w/ meals 30 mL 2  . INVOKANA 300 MG TABS tablet TAKE 1 TABLET BY MOUTH DAILY BEFORE BREAKFAST 30 tablet 11  . levothyroxine (SYNTHROID, LEVOTHROID) 75 MCG tablet TAKE 1 TABLET BY MOUTH ONCE A DAY ON AN EMPTY STOMACH 30 MINUTES BEFORE EATING 30 tablet 5  . metFORMIN (GLUCOPHAGE) 1000 MG tablet TAKE 1 TABLET (1,000 MG TOTAL) BY MOUTH 2 (TWO) TIMES DAILY WITH A MEAL. 60 tablet 11  . naproxen (NAPROSYN) 500 MG tablet TAKE 1 TABLET (500 MG TOTAL) BY MOUTH 2 (TWO) TIMES DAILY WITH A MEAL. 60 tablet 3  . omeprazole (PRILOSEC) 40 MG capsule TAKE 1 CAPSULE (40 MG TOTAL) BY MOUTH DAILY. 30 capsule 5  . tadalafil (CIALIS) 20 MG tablet Take 1 tablet (20 mg total) by mouth as directed. 10 tablet 11  . traMADol (ULTRAM) 50 MG tablet     . vitamin B-12 (CYANOCOBALAMIN) 1000 MCG tablet Take 1,000 mcg by mouth daily.       No current facility-administered medications on file prior to visit.     Review of Systems  Constitutional: Positive for appetite change and fatigue. Negative for fever.  HENT: Positive for congestion, ear pain, postnasal drip, rhinorrhea, sinus pressure, sneezing and sore throat. Negative for ear discharge and sinus pain.   Eyes: Positive for discharge, redness and itching. Negative for pain.  Respiratory: Positive for cough. Negative for shortness of breath, wheezing and stridor.   Cardiovascular: Negative for chest pain.  Gastrointestinal: Negative for diarrhea, nausea and  vomiting.  Genitourinary: Negative for frequency, hematuria and urgency.  Musculoskeletal: Negative for arthralgias and myalgias.  Skin: Negative for rash.  Neurological: Positive for headaches. Negative for dizziness, weakness and light-headedness.  Psychiatric/Behavioral: Negative for confusion and dysphoric mood.       Objective:   Physical Exam  Constitutional: He appears well-developed and well-nourished. No distress.  overwt and not ill appearing  HENT:  Head: Normocephalic and atraumatic.  Right Ear: External ear normal.  Mouth/Throat: Oropharynx is clear and moist.  Nares are injected and congested  No sinus tenderness Clear rhinorrhea and post nasal drip   L TM is erythematous with effusion and slt bulging  Not ruptured No d/c  Eyes: EOM are normal. Pupils are equal, round, and reactive to light. Right eye exhibits no discharge. Left eye exhibits no discharge.  Mild injection of R conj, w/o swelling of eye or lid   Nl eom  Nl vision  Scant tearing/ no purulent drainage   Neck: Normal range of motion. Neck supple.  Cardiovascular: Normal rate and normal heart sounds.   Pulmonary/Chest: Effort normal and breath sounds normal. No respiratory distress. He has no wheezes. He has no rales. He exhibits no tenderness.  Good air exch  Lymphadenopathy:    He has no cervical adenopathy.  Neurological: He is alert.  Skin: Skin is warm and dry. No rash noted.  Psychiatric: He has a normal mood and affect.          Assessment & Plan:   Problem List Items Addressed This Visit      Nervous and Auditory   OME (otitis media with effusion), left    L ear With uri  Cover with augmentin bid 7 d Disc symptomatic care - see instructions on AVS  Update if not starting to improve in a week or if worsening        Relevant Medications   amoxicillin-clavulanate (AUGMENTIN) 875-125 MG tablet     Other   Conjunctivitis    Pt self tx over the wkend with polytrim - improving   Worse in R eye Px his own bottle  Disc hygiene  Continue until clear No contact lenses Update if not starting to improve in a week or if worsening

## 2016-09-07 NOTE — Assessment & Plan Note (Signed)
Pt self tx over the wkend with polytrim - improving  Worse in R eye Px his own bottle  Disc hygiene  Continue until clear No contact lenses Update if not starting to improve in a week or if worsening

## 2016-09-07 NOTE — Progress Notes (Signed)
Pre visit review using our clinic review tool, if applicable. No additional management support is needed unless otherwise documented below in the visit note. 

## 2016-09-07 NOTE — Patient Instructions (Addendum)
Keep using the eye drops until better  Wash hands and glasses and sheets/towels frequently  You have an ear infection on the left- take augmentin   You can try mucinex for congestion  Zyrtec for runny nose if needed  For fever or pain - ibuprofen or tylenol    Update if not starting to improve in a week or if worsening

## 2016-09-09 NOTE — Assessment & Plan Note (Signed)
Pt sees Dr Cruzita Lederer

## 2016-09-11 DIAGNOSIS — M75102 Unspecified rotator cuff tear or rupture of left shoulder, not specified as traumatic: Secondary | ICD-10-CM | POA: Diagnosis not present

## 2016-09-11 DIAGNOSIS — Z4789 Encounter for other orthopedic aftercare: Secondary | ICD-10-CM | POA: Diagnosis not present

## 2016-09-11 DIAGNOSIS — M25512 Pain in left shoulder: Secondary | ICD-10-CM | POA: Diagnosis not present

## 2016-09-12 DIAGNOSIS — M25512 Pain in left shoulder: Secondary | ICD-10-CM | POA: Diagnosis not present

## 2016-09-13 ENCOUNTER — Other Ambulatory Visit: Payer: Self-pay | Admitting: Family Medicine

## 2016-09-13 NOTE — Telephone Encounter (Signed)
Pt was seen on 09/07/16; pt is almost out of abx and rt ear still swollen and lt ear is starting to get better; still hard of hearing. No fever. [facility].

## 2016-09-13 NOTE — Telephone Encounter (Signed)
Please repeat the augmentin once  F/u after that if no improvement

## 2016-09-13 NOTE — Telephone Encounter (Signed)
Rx sent to pharmacy and pt notified of Dr. Tower's comments  

## 2016-09-17 ENCOUNTER — Other Ambulatory Visit: Payer: Self-pay | Admitting: Endocrinology

## 2016-09-21 ENCOUNTER — Other Ambulatory Visit: Payer: Self-pay | Admitting: Family Medicine

## 2016-09-21 ENCOUNTER — Ambulatory Visit (INDEPENDENT_AMBULATORY_CARE_PROVIDER_SITE_OTHER): Payer: 59 | Admitting: Family Medicine

## 2016-09-21 ENCOUNTER — Encounter: Payer: Self-pay | Admitting: Family Medicine

## 2016-09-21 VITALS — BP 134/72 | HR 80 | Temp 98.9°F | Ht 69.0 in | Wt 247.8 lb

## 2016-09-21 DIAGNOSIS — I10 Essential (primary) hypertension: Secondary | ICD-10-CM | POA: Diagnosis not present

## 2016-09-21 DIAGNOSIS — E7800 Pure hypercholesterolemia, unspecified: Secondary | ICD-10-CM

## 2016-09-21 DIAGNOSIS — H6983 Other specified disorders of Eustachian tube, bilateral: Secondary | ICD-10-CM

## 2016-09-21 DIAGNOSIS — E039 Hypothyroidism, unspecified: Secondary | ICD-10-CM | POA: Diagnosis not present

## 2016-09-21 DIAGNOSIS — N183 Chronic kidney disease, stage 3 (moderate): Secondary | ICD-10-CM

## 2016-09-21 DIAGNOSIS — E1122 Type 2 diabetes mellitus with diabetic chronic kidney disease: Secondary | ICD-10-CM

## 2016-09-21 DIAGNOSIS — IMO0002 Reserved for concepts with insufficient information to code with codable children: Secondary | ICD-10-CM

## 2016-09-21 DIAGNOSIS — Z794 Long term (current) use of insulin: Secondary | ICD-10-CM

## 2016-09-21 DIAGNOSIS — E1165 Type 2 diabetes mellitus with hyperglycemia: Secondary | ICD-10-CM

## 2016-09-21 LAB — CBC WITH DIFFERENTIAL/PLATELET
BASOS ABS: 0.1 10*3/uL (ref 0.0–0.1)
Basophils Relative: 0.9 % (ref 0.0–3.0)
Eosinophils Absolute: 0.3 10*3/uL (ref 0.0–0.7)
Eosinophils Relative: 3.1 % (ref 0.0–5.0)
HCT: 47.1 % (ref 39.0–52.0)
HEMOGLOBIN: 16.3 g/dL (ref 13.0–17.0)
LYMPHS ABS: 1.9 10*3/uL (ref 0.7–4.0)
Lymphocytes Relative: 19.1 % (ref 12.0–46.0)
MCHC: 34.5 g/dL (ref 30.0–36.0)
MCV: 98 fl (ref 78.0–100.0)
MONO ABS: 0.9 10*3/uL (ref 0.1–1.0)
MONOS PCT: 8.8 % (ref 3.0–12.0)
NEUTROS PCT: 68.1 % (ref 43.0–77.0)
Neutro Abs: 6.6 10*3/uL (ref 1.4–7.7)
Platelets: 264 10*3/uL (ref 150.0–400.0)
RBC: 4.81 Mil/uL (ref 4.22–5.81)
RDW: 12.9 % (ref 11.5–15.5)
WBC: 9.7 10*3/uL (ref 4.0–10.5)

## 2016-09-21 LAB — LDL CHOLESTEROL, DIRECT: LDL DIRECT: 48 mg/dL

## 2016-09-21 LAB — LIPID PANEL
CHOL/HDL RATIO: 3
Cholesterol: 124 mg/dL (ref 0–200)
HDL: 47.6 mg/dL (ref >39.00)
NonHDL: 76.83
Triglycerides: 211 mg/dL — ABNORMAL HIGH (ref 0.0–149.0)
VLDL: 42.2 mg/dL — ABNORMAL HIGH (ref 0.0–40.0)

## 2016-09-21 LAB — COMPREHENSIVE METABOLIC PANEL
ALT: 34 U/L (ref 0–53)
AST: 22 U/L (ref 0–37)
Albumin: 4.4 g/dL (ref 3.5–5.2)
Alkaline Phosphatase: 81 U/L (ref 39–117)
BUN: 14 mg/dL (ref 6–23)
CHLORIDE: 99 meq/L (ref 96–112)
CO2: 31 meq/L (ref 19–32)
CREATININE: 1 mg/dL (ref 0.40–1.50)
Calcium: 9.5 mg/dL (ref 8.4–10.5)
GFR: 84.22 mL/min (ref >60.00)
GLUCOSE: 188 mg/dL — AB (ref 70–99)
Potassium: 4.9 mEq/L (ref 3.5–5.1)
Sodium: 137 mEq/L (ref 135–145)
Total Bilirubin: 0.5 mg/dL (ref 0.2–1.2)
Total Protein: 7.1 g/dL (ref 6.0–8.3)

## 2016-09-21 LAB — TSH: TSH: 1.17 u[IU]/mL (ref 0.35–4.50)

## 2016-09-21 NOTE — Assessment & Plan Note (Signed)
bp in fair control at this time  BP Readings from Last 1 Encounters:  09/21/16 134/72   No changes needed Disc lifstyle change with low sodium diet and exercise  Pt req labs today

## 2016-09-21 NOTE — Assessment & Plan Note (Signed)
Pt will see endocrinology in march  Per pt stable

## 2016-09-21 NOTE — Assessment & Plan Note (Signed)
Due for 6 mo check  Diet is stable

## 2016-09-21 NOTE — Patient Instructions (Addendum)
Use flonase 2 sprays in each nostril twice daily for 4 days and then once daily for 2-4 weeks If no improvement or worse please alert [place]  Drink lots of fluids   Labs today

## 2016-09-21 NOTE — Progress Notes (Signed)
Subjective:    Patient ID: Mitchell Palmer, male    DOB: 1966/03/03, 51 y.o.   MRN: [id number]  HPI Here for f/u with ear problems and labs   Seen on 1/19 for L ear pain / OM  tx with augmentin   Ear is still uncomfortable Can hear better  2 d left of augmentin No drainage   No fever  Was flushed feeling one day   Some sinus drainage and congestion  He takes advil cold and sinus   Eye is better - resolved conjunctivitis   Wt Readings from Last 3 Encounters:  09/21/16 247 lb 12 oz (112.4 kg)  09/07/16 250 lb 4 oz (113.5 kg)  08/15/16 249 lb 6.4 oz (113.1 kg)  bmi is 36.5   bp is stable today  No cp or palpitations or headaches or edema  No side effects to medicines  BP Readings from Last 3 Encounters:  09/21/16 134/72  09/07/16 124/76  08/15/16 (!) 144/82     Hypothyroidism  Pt has no clinical changes No change in energy level/ hair or skin/ edema and no tremor Lab Results  Component Value Date   TSH 3.05 04/24/2016     Wants to check this along with lipids  Due for 6 mo check of lipids Lab Results  Component Value Date   CHOL 124 09/21/2016   HDL 47.60 09/21/2016   LDLCALC 62 09/30/2013   LDLDIRECT 48.0 09/21/2016   TRIG 211.0 (H) 09/21/2016   CHOLHDL 3 09/21/2016     Patient Active Problem List   Diagnosis Date Noted  . Conjunctivitis 09/07/2016  . Obesity 01/24/2016  . Hypothyroidism 01/24/2016  . Uncontrolled type 2 diabetes mellitus with stage 3 chronic kidney disease, with long-term current use of insulin ([place]) 07/28/2015  . Alcohol consumption heavy 12/06/2014  . Swelling, mass, or lump in head and neck 06/30/2014  . Necrobiosis diabeticorum ([place]) 03/01/2014  . ETD (eustachian tube dysfunction) 08/22/2012  . Low back pain 06/17/2012  . Abnormal chest x-ray 10/15/2011  . PLANTAR FASCIITIS, TRAUMATIC 11/09/2009  . NEOPLASM UNCERTAIN BEHAVIOR OTHER SPEC SITES 10/07/2009  . ESOPHAGEAL STRICTURE 06/27/2009  . GERD 03/31/2009  . OTHER  DYSPHAGIA 03/31/2009  . Hyperlipidemia 01/06/2009  . Essential hypertension 01/06/2009  . ALLERGIC RHINITIS 01/06/2009   Past Medical History:  Diagnosis Date  . Allergy    allegic rhinitis  . Diabetes mellitus    type II  . Esophageal stricture   . GERD (gastroesophageal reflux disease)   . History of hernia repair    as a baby  . Hyperlipidemia   . Hyperplastic polyp of intestine 2010  . Hypertension   . S/P ear surgery, follow-up exam    for drainage   Past Surgical History:  Procedure Laterality Date  . COLONOSCOPY    . ESOPHAGOGASTRODUODENOSCOPY  04/2009   stricture/GERD  . HERNIA REPAIR     age 39-midline  . [place]   went in behind rt ear-blockage  . POLYPECTOMY    . SHOULDER ARTHROSCOPY WITH ROTATOR CUFF REPAIR AND SUBACROMIAL DECOMPRESSION Right 09/29/2012   Procedure: SHOULDER ARTHROSCOPY WITH ROTATOR CUFF REPAIR AND SUBACROMIAL DECOMPRESSION;  Surgeon: Nita Sells, MD;  Location: [facility];  Service: Orthopedics;  Laterality: Right;  Right shoulder arthroscopy with subacromial decompression and distal clavicle excision & Debridement of Labrial Tear  . UPPER GASTROINTESTINAL ENDOSCOPY     Social History  Substance Use Topics  . Smoking status: Never Smoker  .  Smokeless tobacco: Never Used  . Alcohol use 7.2 oz/week    12 Standard drinks or equivalent per week     Comment: 6 pk every 3 days; 12 pk/week   Family History  Problem Relation Age of Onset  . Diabetes Mother   . Colon cancer Father 41  . Diabetes Maternal Aunt   . Cancer Cousin     breast cancer  . Diabetes Other   . Colon polyps Brother    Allergies  Allergen Reactions  . Codeine     REACTION: anxious   Current Outpatient Prescriptions on File Prior to Visit  Medication Sig Dispense Refill  . amoxicillin-clavulanate (AUGMENTIN) 875-125 MG tablet TAKE 1 TABLET BY MOUTH 2 (TWO) TIMES DAILY. 14 tablet 0  . atorvastatin (LIPITOR) 40 MG tablet TAKE 1  TABLET (40 MG TOTAL) BY MOUTH DAILY. 30 tablet 5  . B-D INS SYR ULTRAFINE 1CC/31G 31G X 5/16" 1 ML MISC INJECT 35 UNITS INTO THE SKIN EVERY 12 HOURS AS DIRECTED BY PHYSICIAN. 200 each 2  . B-D UF III MINI PEN NEEDLES 31G X 5 MM MISC USE 4 TIMES A DAY 300 each 4  . BENICAR 40 MG tablet TAKE 1 TABLET BY MOUTH EVERY DAY 30 tablet 5  . cyclobenzaprine (FLEXERIL) 10 MG tablet TAKE 1 TABLET BY MOUTH 3 TIMES A DAY AS NEEDED 40 tablet 1  . diclofenac (VOLTAREN) 75 MG EC tablet Reported on 02/15/2016    . glucose blood (ONE TOUCH ULTRA TEST) test strip TEST BLOOD SUGAR 4 TIMES A DAY AS DIRECTED 200 each 11  . HUMALOG KWIKPEN 100 UNIT/ML KiwkPen INJECT 15-25 UNITS INTO THE SKIN 3 TIMES W/ MEALS, 10-12 UNITS INTO THE SKIN W/ SNACKS. 10 pen 2  . HYDROcodone-acetaminophen (NORCO/VICODIN) 5-325 MG per tablet Take 1 tablet by mouth every 6 (six) hours as needed for moderate pain. 40 tablet 0  . Insulin Glargine (BASAGLAR KWIKPEN) 100 UNIT/ML SOPN INJECT 40 UNITS UNDER SKIN AT THE BEGINNING OF YOUR DAY AND 30 UNITS AT BEDTIME 30 mL 5  . insulin lispro (HUMALOG KWIKPEN) 100 UNIT/ML KiwkPen 15-35 units into the skin 3 times w/ meals 30 mL 2  . INVOKANA 300 MG TABS tablet TAKE 1 TABLET BY MOUTH DAILY BEFORE BREAKFAST 30 tablet 11  . levothyroxine (SYNTHROID, LEVOTHROID) 75 MCG tablet TAKE 1 TABLET BY MOUTH ONCE A DAY ON AN EMPTY STOMACH 30 MINUTES BEFORE EATING 30 tablet 5  . metFORMIN (GLUCOPHAGE) 1000 MG tablet TAKE 1 TABLET (1,000 MG TOTAL) BY MOUTH 2 (TWO) TIMES DAILY WITH A MEAL. 60 tablet 11  . naproxen (NAPROSYN) 500 MG tablet TAKE 1 TABLET (500 MG TOTAL) BY MOUTH 2 (TWO) TIMES DAILY WITH A MEAL. 60 tablet 3  . omeprazole (PRILOSEC) 40 MG capsule TAKE 1 CAPSULE (40 MG TOTAL) BY MOUTH DAILY. 30 capsule 5  . tadalafil (CIALIS) 20 MG tablet Take 1 tablet (20 mg total) by mouth as directed. 10 tablet 11  . traMADol (ULTRAM) 50 MG tablet     . trimethoprim-polymyxin b (POLYTRIM) ophthalmic solution Place 2 drops  into both eyes 3 (three) times daily.    [name] trimethoprim-polymyxin b (POLYTRIM) ophthalmic solution Place 2 drops into both eyes every 6 (six) hours. 10 mL 0  . vitamin B-12 (CYANOCOBALAMIN) 1000 MCG tablet Take 1,000 mcg by mouth daily.       No current facility-administered medications on file prior to visit.     Review of Systems Review of Systems  Constitutional: Negative for fever,  appetite change, fatigue and unexpected weight change.  Eyes: Negative for pain and visual disturbance.  ENT pos for bilateral ear fullness and some sinus congestion  Respiratory: Negative for cough and shortness of breath.   Cardiovascular: Negative for cp or palpitations    Gastrointestinal: Negative for nausea, diarrhea and constipation.  Genitourinary: Negative for urgency and frequency.  Skin: Negative for pallor or rash   Neurological: Negative for weakness, light-headedness, numbness and headaches.  Hematological: Negative for adenopathy. Does not bruise/bleed easily.  Psychiatric/Behavioral: Negative for dysphoric mood. The patient is not nervous/anxious.         Objective:   Physical Exam  Constitutional: He appears well-developed and well-nourished. No distress.  overwt and well appearing   HENT:  Head: Normocephalic and atraumatic.  Mouth/Throat: Oropharynx is clear and moist.  Nares are boggy TMs dull bilat  Small eff on the R No erythema or bulging No sinus tenderness  Eyes: Conjunctivae and EOM are normal. Pupils are equal, round, and reactive to light. Right eye exhibits no discharge. Left eye exhibits no discharge. No scleral icterus.  Neck: Normal range of motion. Neck supple. No JVD present. Carotid bruit is not present. No thyromegaly present.  Cardiovascular: Normal rate, regular rhythm, normal heart sounds and intact distal pulses.  Exam reveals no gallop.   Pulmonary/Chest: Effort normal and breath sounds normal. No respiratory distress. He has no wheezes. He has no rales.    No crackles  Abdominal: Soft. Bowel sounds are normal. He exhibits no distension, no abdominal bruit and no mass. There is no tenderness.  Musculoskeletal: He exhibits no edema.  Lymphadenopathy:    He has no cervical adenopathy.  Neurological: He is alert. He has normal reflexes. No cranial nerve deficit. He exhibits normal muscle tone. Coordination normal.  Skin: Skin is warm and dry. No rash noted.  Psychiatric: He has a normal mood and affect.          Assessment & Plan:   Problem List Items Addressed This Visit      Cardiovascular and Mediastinum   Essential hypertension - Primary    bp in fair control at this time  BP Readings from Last 1 Encounters:  09/21/16 134/72   No changes needed Disc lifstyle change with low sodium diet and exercise  Pt req labs today      Relevant Orders   CBC with Differential/Platelet (Completed)   Comprehensive metabolic panel (Completed)   Lipid panel (Completed)   TSH (Completed)     Endocrine   Hypothyroidism    Pt req TSH No clinical changes on 75 mcg levothyroxine       Relevant Orders   TSH (Completed)   Uncontrolled type 2 diabetes mellitus with stage 3 chronic kidney disease, with long-term current use of insulin (HCC)    Pt will see endocrinology in march  Per pt stable        Nervous and Auditory   ETD (eustachian tube dysfunction)    S/p L OM  Bilateral ear fullness Will tx with flonase (he has at home) bid for 4 d and then qd for 2-4 wk Update if worse or no imp        Other   Hyperlipidemia    Due for 6 mo check  Diet is stable        Relevant Orders   Lipid panel (Completed)

## 2016-09-21 NOTE — Progress Notes (Signed)
Pre visit review using our clinic review tool, if applicable. No additional management support is needed unless otherwise documented below in the visit note. 

## 2016-09-21 NOTE — Assessment & Plan Note (Signed)
Pt req TSH No clinical changes on 75 mcg levothyroxine

## 2016-09-21 NOTE — Assessment & Plan Note (Signed)
S/p L OM  Bilateral ear fullness Will tx with flonase (he has at home) bid for 4 d and then qd for 2-4 wk Update if worse or no imp

## 2016-10-29 ENCOUNTER — Other Ambulatory Visit: Payer: Self-pay | Admitting: Family Medicine

## 2016-11-13 ENCOUNTER — Ambulatory Visit: Payer: 59 | Admitting: Internal Medicine

## 2016-11-14 ENCOUNTER — Encounter: Payer: Self-pay | Admitting: Family Medicine

## 2016-11-14 ENCOUNTER — Ambulatory Visit (INDEPENDENT_AMBULATORY_CARE_PROVIDER_SITE_OTHER): Payer: 59 | Admitting: Family Medicine

## 2016-11-14 VITALS — BP 106/60 | HR 83 | Temp 98.4°F | Ht 69.0 in | Wt 240.0 lb

## 2016-11-14 DIAGNOSIS — J0101 Acute recurrent maxillary sinusitis: Secondary | ICD-10-CM

## 2016-11-14 MED ORDER — LEVOFLOXACIN 500 MG PO TABS: 500.0000 mg | ORAL_TABLET | Freq: Every day | ORAL | 0 refills | Status: DC

## 2016-11-14 MED ORDER — HYDROCODONE-HOMATROPINE 5-1.5 MG/5ML PO SYRP: ORAL_SOLUTION | ORAL | 0 refills | Status: DC

## 2016-11-14 NOTE — Progress Notes (Signed)
Pre visit review using our clinic review tool, if applicable. No additional management support is needed unless otherwise documented below in the visit note. 

## 2016-11-14 NOTE — Progress Notes (Signed)
Dr. Frederico Hamman T. Tiyonna Sardinha, MD, [facility] [place], [address] Phone: [phone] Fax: [phone]  11/14/2016  Patient: Mitchell Palmer, MRN: [id number], DOB: 10-22-1965, 51 y.o.  Primary Physician:  Loura Pardon, MD   Chief Complaint  Patient presents with  . Sinusitis  . Cough  . Wheezing  . Fever    low grade   Subjective:   Mitchell Palmer is a 51 y.o. very pleasant male patient who presents with the following:  Had a sinus infection and double ear - then got a little better and has gotten much worse. Now wheezing. Feels terrible. Coughing, too.  Never fully got better and now with return of symptoms and pain in his head again, too.  2 mo ago, Augmentin.  Past Medical History, Surgical History, Social History, Family History, Problem List, Medications, and Allergies have been reviewed and updated if relevant.  Patient Active Problem List   Diagnosis Date Noted  . Conjunctivitis 09/07/2016  . Obesity 01/24/2016  . Hypothyroidism 01/24/2016  . Uncontrolled type 2 diabetes mellitus with stage 3 chronic kidney disease, with long-term current use of insulin ([place]) 07/28/2015  . Alcohol consumption heavy 12/06/2014  . Swelling, mass, or lump in head and neck 06/30/2014  . Necrobiosis diabeticorum ([place]) 03/01/2014  . ETD (eustachian tube dysfunction) 08/22/2012  . Low back pain 06/17/2012  . Abnormal chest x-ray 10/15/2011  . PLANTAR FASCIITIS, TRAUMATIC 11/09/2009  . NEOPLASM UNCERTAIN BEHAVIOR OTHER SPEC SITES 10/07/2009  . ESOPHAGEAL STRICTURE 06/27/2009  . GERD 03/31/2009  . OTHER DYSPHAGIA 03/31/2009  . Hyperlipidemia 01/06/2009  . Essential hypertension 01/06/2009  . ALLERGIC RHINITIS 01/06/2009    Past Medical History:  Diagnosis Date  . Allergy    allegic rhinitis  . Diabetes mellitus    type II  . Esophageal stricture   . GERD (gastroesophageal reflux disease)   . History of hernia repair    as a baby  . Hyperlipidemia   . Hyperplastic polyp of intestine 2010  . Hypertension   . S/P ear surgery, follow-up exam    for drainage    Past Surgical History:  Procedure Laterality Date  . COLONOSCOPY    . ESOPHAGOGASTRODUODENOSCOPY  04/2009   stricture/GERD  . HERNIA REPAIR     age 54-midline  . [place]   went in behind rt ear-blockage  . POLYPECTOMY    . SHOULDER ARTHROSCOPY WITH ROTATOR CUFF REPAIR AND SUBACROMIAL DECOMPRESSION Right 09/29/2012   Procedure: SHOULDER ARTHROSCOPY WITH ROTATOR CUFF REPAIR AND SUBACROMIAL DECOMPRESSION;  Surgeon: Nita Sells, MD;  Location: [facility];  Service: Orthopedics;  Laterality: Right;  Right shoulder arthroscopy with subacromial decompression and distal clavicle excision & Debridement of Labrial Tear  . UPPER GASTROINTESTINAL ENDOSCOPY      Social History   Social History  . Marital status: Single    Spouse name: N/A  . Number of children: N/A  . Years of education: N/A   Occupational History  . Operator    Social History Main Topics  . Smoking status: Never Smoker  . Smokeless tobacco: Never Used  . Alcohol use 7.2 oz/week    12 Standard drinks or equivalent per week     Comment: 6 pk every 3 days; 12 pk/week  . Drug use: No  . Sexual activity: Not on file   Other Topics Concern  . Not on file   Social History Narrative  . No narrative  on file    Family History  Problem Relation Age of Onset  . Diabetes Mother   . Colon cancer Father 44  . Diabetes Maternal Aunt   . Cancer Cousin     breast cancer  . Diabetes Other   . Colon polyps Brother     Allergies  Allergen Reactions  . Codeine     REACTION: anxious    Medication list reviewed and updated in full in [facility].  ROS: GEN: Acute illness details above GI: Tolerating PO intake GU: maintaining adequate hydration and urination Pulm: No SOB Interactive and getting along well at home.  Otherwise,  ROS is as per the HPI.  Objective:   BP 106/60   Pulse 83   Temp 98.4 F (36.9 C) (Oral)   Ht 5\' 9"  (1.753 m)   Wt 240 lb (108.9 kg)   SpO2 97%   BMI 35.44 kg/m    Gen: WDWN, NAD; alert,appropriate and cooperative throughout exam    HEENT: Normocephalic and atraumatic. Throat clear, w/o exudate, no LAD, R TM clear, L TM - good landmarks, No fluid present. rhinnorhea.  Left frontal and maxillary sinuses: Tender, max Right frontal and maxillary sinuses: Tender, max   Neck: No ant or post LAD  CV: RRR, No M/G/R  Pulm: Breathing comfortably in no resp distress. no w/c/r  Abd: S,NT,ND,+BS Extr: no c/c/e Psych: full affect, pleasant    Laboratory and Imaging Data:  Assessment and Plan:   Acute recurrent maxillary sinusitis  Recurrent sinusitis with additional respiratory symptoms. Supportive care. Given failure of Augmentin, treated with Levaquin.  Follow-up: No Follow-up on file.  Meds ordered this encounter  Medications  . levofloxacin (LEVAQUIN) 500 MG tablet    Sig: Take 1 tablet (500 mg total) by mouth daily.    Dispense:  10 tablet    Refill:  0  . HYDROcodone-homatropine (HYCODAN) 5-1.5 MG/5ML syrup    Sig: 1 tsp po at night before bed prn cough    Dispense:  180 mL    Refill:  0   Medications Discontinued During This Encounter  Medication Reason  . trimethoprim-polymyxin b (POLYTRIM) ophthalmic solution Completed Course  . trimethoprim-polymyxin b (POLYTRIM) ophthalmic solution Completed Course  . amoxicillin-clavulanate (AUGMENTIN) 875-125 MG tablet Completed Course    Signed,  Frederico Hamman T. Lofton Leon, MD   Allergies as of 11/14/2016      Reactions   Codeine    REACTION: anxious      Medication List       Accurate as of 11/14/16  1:48 PM. Always use your most recent med list.          atorvastatin 40 MG tablet Commonly known as:  LIPITOR TAKE 1 TABLET (40 MG TOTAL) BY MOUTH DAILY.   B-D INS SYR ULTRAFINE 1CC/31G 31G X 5/16" 1 ML Misc Generic  drug:  Insulin Syringe-Needle U-100 INJECT 35 UNITS INTO THE SKIN EVERY 12 HOURS AS DIRECTED BY PHYSICIAN.   B-D UF III MINI PEN NEEDLES 31G X 5 MM Misc Generic drug:  Insulin Pen Needle USE 4 TIMES A DAY   BASAGLAR KWIKPEN 100 UNIT/ML Sopn INJECT 40 UNITS UNDER SKIN AT THE BEGINNING OF YOUR DAY AND 30 UNITS AT BEDTIME   BENICAR 40 MG tablet Generic drug:  olmesartan TAKE 1 TABLET BY MOUTH EVERY DAY   cyclobenzaprine 10 MG tablet Commonly known as:  FLEXERIL TAKE 1 TABLET BY MOUTH 3 TIMES A DAY AS NEEDED   diclofenac 75 MG EC tablet  Commonly known as:  VOLTAREN Reported on 02/15/2016   fluticasone 50 MCG/ACT nasal spray Commonly known as:  FLONASE USE 2 SPRAYS IN EACH NOSTRIL ONCE A DAY   glucose blood test strip Commonly known as:  ONE TOUCH ULTRA TEST TEST BLOOD SUGAR 4 TIMES A DAY AS DIRECTED   HYDROcodone-acetaminophen 5-325 MG tablet Commonly known as:  NORCO/VICODIN Take 1 tablet by mouth every 6 (six) hours as needed for moderate pain.   HYDROcodone-homatropine 5-1.5 MG/5ML syrup Commonly known as:  HYCODAN 1 tsp po at night before bed prn cough   insulin lispro 100 UNIT/ML KiwkPen Commonly known as:  HUMALOG KWIKPEN 15-35 units into the skin 3 times w/ meals   HUMALOG KWIKPEN 100 UNIT/ML KiwkPen Generic drug:  insulin lispro INJECT 15-25 UNITS INTO THE SKIN 3 TIMES W/ MEALS, 10-12 UNITS INTO THE SKIN W/ SNACKS.   INVOKANA 300 MG Tabs tablet Generic drug:  canagliflozin TAKE 1 TABLET BY MOUTH DAILY BEFORE BREAKFAST   levofloxacin 500 MG tablet Commonly known as:  LEVAQUIN Take 1 tablet (500 mg total) by mouth daily.   levothyroxine 75 MCG tablet Commonly known as:  SYNTHROID, LEVOTHROID TAKE 1 TABLET BY MOUTH ONCE A DAY ON AN EMPTY STOMACH 30 MINUTES BEFORE EATING   metFORMIN 1000 MG tablet Commonly known as:  GLUCOPHAGE TAKE 1 TABLET (1,000 MG TOTAL) BY MOUTH 2 (TWO) TIMES DAILY WITH A MEAL.   naproxen 500 MG tablet Commonly known as:   NAPROSYN TAKE 1 TABLET (500 MG TOTAL) BY MOUTH 2 (TWO) TIMES DAILY WITH A MEAL.   omeprazole 40 MG capsule Commonly known as:  PRILOSEC TAKE 1 CAPSULE (40 MG TOTAL) BY MOUTH DAILY.   tadalafil 20 MG tablet Commonly known as:  CIALIS Take 1 tablet (20 mg total) by mouth as directed.   traMADol 50 MG tablet Commonly known as:  ULTRAM   vitamin B-12 1000 MCG tablet Commonly known as:  CYANOCOBALAMIN Take 1,000 mcg by mouth daily.

## 2016-11-15 ENCOUNTER — Encounter: Payer: Self-pay | Admitting: Internal Medicine

## 2016-11-15 ENCOUNTER — Ambulatory Visit (INDEPENDENT_AMBULATORY_CARE_PROVIDER_SITE_OTHER): Payer: 59 | Admitting: Internal Medicine

## 2016-11-15 VITALS — BP 118/76 | HR 70 | Temp 98.0°F | Resp 20 | Wt 242.4 lb

## 2016-11-15 DIAGNOSIS — E1165 Type 2 diabetes mellitus with hyperglycemia: Secondary | ICD-10-CM

## 2016-11-15 DIAGNOSIS — N183 Chronic kidney disease, stage 3 (moderate): Secondary | ICD-10-CM

## 2016-11-15 DIAGNOSIS — Z794 Long term (current) use of insulin: Secondary | ICD-10-CM | POA: Diagnosis not present

## 2016-11-15 DIAGNOSIS — E1122 Type 2 diabetes mellitus with diabetic chronic kidney disease: Secondary | ICD-10-CM

## 2016-11-15 DIAGNOSIS — IMO0002 Reserved for concepts with insufficient information to code with codable children: Secondary | ICD-10-CM

## 2016-11-15 LAB — POCT GLYCOSYLATED HEMOGLOBIN (HGB A1C): HEMOGLOBIN A1C: 7.4

## 2016-11-15 NOTE — Progress Notes (Signed)
Subjective:     Patient ID: Mitchell Palmer, male   DOB: [birth year], 51 y.o.   MRN: [id number]  HPIMr. Crean is a 51 y.o. man, returning for followup for DM2, dx in 2000, uncontrolled, insulin-dependent, with complications (CKD stage 3, erectile dysfunction, mild DR). Last visit 3 mo ago.  His last hemoglobin A1c was: Lab Results  Component Value Date   HGBA1C 7.6 08/15/2016   HGBA1C 7.7 05/15/2016   HGBA1C 7.8 02/15/2016  He had steroid inj's in the past.  He is on a regimen of - free meds: - Metformin 1000 mg 2x a day - Invokana 300 mg before first meal of the day - Basaglar 30 units in am and 40 units in pm (viceversa when not working) - Humalog:  15 units with a snack 30 units for a regular meal                         35 units for a larger meal - if you have dessert or fried food He was on: - Cycloset - started 04/22/2013 >> at 1.6 mg daily (tried 3 tabs >> dizziness >> backed off to 2) - Victoza 1.8 mg daily - Actos 45 mg daily - Januvia - glyburide/metformin - Avandia. - Bydureon 2 mg weekly - he hated the thick needle   He is checking his sugars 2-3 a day (he has a One Touch ultra meter). He forgot log: - am: 86, 140-149 (if 120s, he does not take the Basaglar) >> 90-162, 173 >> 120-136 >> 99-190 - 2 hours after b'fast: 114-194, 202 >> 150-214 >> n/c >> 120-190 >> n/c >> 190-240 - before lunch: 73-120, 162 >> 132, 171 >> 120-170 >> 120-163, 217 >> 150s >> 86-147, 155 - 2 hours after lunch: 147-180, 288 >> 95, 135-217 >> 150-240 >> 170-232, 266 >> 175 >> 76, 132-188, 195 - Before dinner: 106-190, 233 >> 95, 122-209, 224 >> 140s >> 160-190 >> 200 >> 68-148, 155, 165 (in am) - bedtime: 175-250 >> 124, 183, 190 >> 140s >> 160-214 >> 180s  >> 154-190 - nighttime: 168 >> n/c Lowest: 89 >> 80 >> 68.  He has hypoglycemia awareness ~80.  Highest 266 >> 250 >> 200s .  He works nights: 6 pm - 7:30 am. Restarted work 1 mo ago, after his surgery, 3.5 days a week.  Meals: -  5:30-6 pm (at work): subway sandwich + chips +/- cookie (!) - snack at 9-10 pm: pistachios or 1/2 sandwich - 4:30 am: coffee - 11 am-12 pm: salad, meat + veggies + rice (leftovers from take out)  - Has HL (increased TG). Last lipid panel: Lab Results  Component Value Date   CHOL 124 09/21/2016   HDL 47.60 09/21/2016   LDLCALC 62 09/30/2013   LDLDIRECT 48.0 09/21/2016   TRIG 211.0 (H) 09/21/2016   CHOLHDL 3 09/21/2016  He is on Atorvastatin. - last eye exam was with Dr. Katy Fitch in 10/2015, mild retinopathy, L>R  - No numbness or tingling in his legs. Foot exam performed today: normal - He does have chronic kidney disease - stage 2, last BUN/Cr:  Lab Results  Component Value Date   BUN 14 09/21/2016   CREATININE 1.00 09/21/2016   Component     Latest Ref Rng 02/15/2016  Microalb, Ur     0.0 - 1.9 mg/dL <0.7  Creatinine,U      80.7  MICROALB/CREAT RATIO     0.0 - 30.0 mg/g  0.9  He is on Olmesartan.  He also has a history of hyperlipidemia, hypertension, GERD, esophageal stricture, plantar fasciitis, eustachian tube dysfunction, status post ear surgery, history of increased alcohol use.  He has hypothyroidism - managed by PCP. On LT4 50 mcg daily - started 01/23/2016.  Lab Results  Component Value Date   TSH 1.17 09/21/2016   Review of Systems Constitutional: no weight gain, no fatigue Eyes: no blurry vision, no xerophthalmia ENT: no sore throat, no nodules palpated in throat, no dysphagia/odynophagia, no hoarseness Cardiovascular: no CP/SOB/palpitations/leg swelling Respiratory: no cough/SOB Gastrointestinal: no N/V/D/C Musculoskeletal: no muscle aches/joint aches Skin: no rashes  I reviewed pt's medications, allergies, PMH, social hx, family hx, and changes were documented in the history of present illness. Otherwise, unchanged from my initial visit note.   Objective:   Physical Exam BP 118/76 (BP Location: Right Arm, Patient Position: Sitting, Cuff Size: Large)    Pulse 70   Temp 98 F (36.7 C)   Resp 20   Wt 242 lb 6.4 oz (110 kg)   BMI 35.80 kg/m  Body mass index is 35.8 kg/m.   Wt Readings from Last 3 Encounters:  11/15/16 242 lb 6.4 oz (110 kg)  11/14/16 240 lb (108.9 kg)  09/21/16 247 lb 12 oz (112.4 kg)  Constitutional: obese, in NAD Eyes: PERRLA, EOMI, no exophthalmos ENT: moist mucous membranes, no thyromegaly, no cervical lymphadenopathy Cardiovascular: RRR, No MRG Respiratory: CTA B Gastrointestinal: abdomen soft, NT, ND, BS+ Musculoskeletal: no deformities, strength intact in all 4 Skin: moist, warm, no rashes + foot exam performed today Assessment:     1. 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 Sugars are a little better than last visit, the highest being after b'fast as he has a [employer] sandwich + chips + occas. Cookie >> advised him to eliminate the chips and he cookies, but will not change the regimen for now. - I advised him to: Patient Instructions  Please continue: - Metformin 1000 mg 2x a day - Invokana 300 mg before first meal of the day - Basaglar 30 units in am and 40 units in pm - Humalog:  15 units with a snack 30 units for a regular meal                         35 units for a larger meal - if you have dessert or fried food  Stop chips and cookies with b'fast.  Please come back for a follow-up appointment in 3 months with your log.  - given flu shot this season - advised for yearly eye exams >> needs one - scheduled - reviewed last hba1c >> 7.4% (slightly better) - RTC in 3 mo   Philemon Kingdom, MD PhD [facility] Endocrinology

## 2016-11-15 NOTE — Patient Instructions (Addendum)
Please continue: - Metformin 1000 mg 2x a day - Invokana 300 mg before first meal of the day - Basaglar 30 units in am and 40 units in pm - Humalog:  15 units with a snack 30 units for a regular meal                         35 units for a larger meal - if you have dessert or fried food  Stop chips and cookies with b'fast.  Please come back for a follow-up appointment in 3 months with your log.

## 2016-11-20 DIAGNOSIS — E113393 Type 2 diabetes mellitus with moderate nonproliferative diabetic retinopathy without macular edema, bilateral: Secondary | ICD-10-CM | POA: Diagnosis not present

## 2016-11-20 DIAGNOSIS — H2513 Age-related nuclear cataract, bilateral: Secondary | ICD-10-CM | POA: Diagnosis not present

## 2016-11-20 LAB — HM DIABETES EYE EXAM

## 2016-12-09 ENCOUNTER — Other Ambulatory Visit: Payer: Self-pay | Admitting: Family Medicine

## 2016-12-29 ENCOUNTER — Other Ambulatory Visit: Payer: Self-pay | Admitting: Family Medicine

## 2017-01-11 ENCOUNTER — Other Ambulatory Visit: Payer: Self-pay | Admitting: Endocrinology

## 2017-01-25 ENCOUNTER — Other Ambulatory Visit: Payer: Self-pay | Admitting: Family Medicine

## 2017-01-25 NOTE — Telephone Encounter (Signed)
Last filled on 01/20/16 #60 tabs with 3 additional refills, please advise

## 2017-01-25 NOTE — Telephone Encounter (Signed)
Will refill electronically  

## 2017-02-14 ENCOUNTER — Encounter: Payer: Self-pay | Admitting: Internal Medicine

## 2017-02-14 ENCOUNTER — Ambulatory Visit (INDEPENDENT_AMBULATORY_CARE_PROVIDER_SITE_OTHER): Payer: 59 | Admitting: Internal Medicine

## 2017-02-14 VITALS — BP 128/82 | HR 96 | Temp 98.5°F | Wt 246.0 lb

## 2017-02-14 DIAGNOSIS — K648 Other hemorrhoids: Secondary | ICD-10-CM

## 2017-02-14 DIAGNOSIS — K6289 Other specified diseases of anus and rectum: Secondary | ICD-10-CM

## 2017-02-14 MED ORDER — HYDROCORTISONE ACETATE 25 MG RE SUPP: 25.0000 mg | Freq: Two times a day (BID) | RECTAL | 0 refills | Status: DC

## 2017-02-14 NOTE — Progress Notes (Signed)
Subjective:    Patient ID: Mitchell Palmer, male    DOB: Dec 26, 1965, 51 y.o.   MRN: [id number]  HPI  Pt presents to th clinic today with c/o rectal pain. He reports this started 2 days ago. The pain is located inside the rectum. He describes the pain as sharp and stabbing. He has pain with sitting x 2 days. He reports he intermittently gets constipated. He had a hard stool a few days ago, and has been having rectal pain since that time. He denies rectal itching or bleeding. He reports he had a fissure 30 years ago and this feels the same. He had a colonoscopy 02/2016. He had 2 benign polyps and internal hemorrhoids noted.  Review of Systems      Past Medical History:  Diagnosis Date  . Allergy    allegic rhinitis  . Diabetes mellitus    type II  . Esophageal stricture   . GERD (gastroesophageal reflux disease)   . History of hernia repair    as a baby  . Hyperlipidemia   . Hyperplastic polyp of intestine 2010  . Hypertension   . S/P ear surgery, follow-up exam    for drainage    Current Outpatient Prescriptions  Medication Sig Dispense Refill  . atorvastatin (LIPITOR) 40 MG tablet TAKE 1 TABLET (40 MG TOTAL) BY MOUTH DAILY. 30 tablet 3  . B-D INS SYR ULTRAFINE 1CC/31G 31G X 5/16" 1 ML MISC INJECT 35 UNITS INTO THE SKIN EVERY 12 HOURS AS DIRECTED BY PHYSICIAN. 200 each 2  . B-D UF III MINI PEN NEEDLES 31G X 5 MM MISC USE 4 TIMES A DAY 300 each 4  . BENICAR 40 MG tablet TAKE 1 TABLET BY MOUTH EVERY DAY 30 tablet 5  . cyclobenzaprine (FLEXERIL) 10 MG tablet TAKE 1 TABLET BY MOUTH 3 TIMES A DAY AS NEEDED 40 tablet 1  . diclofenac (VOLTAREN) 75 MG EC tablet Reported on 02/15/2016    . fluticasone (FLONASE) 50 MCG/ACT nasal spray USE 2 SPRAYS IN EACH NOSTRIL ONCE A DAY 16 g 5  . glucose blood (ONE TOUCH ULTRA TEST) test strip TEST BLOOD SUGAR 4 TIMES A DAY AS DIRECTED 200 each 11  . HUMALOG KWIKPEN 100 UNIT/ML KiwkPen INJECT 15-25 UNITS INTO THE SKIN 3 TIMES W/ MEALS, 10-12 UNITS INTO  THE SKIN W/ SNACKS. 10 pen 2  . HUMALOG KWIKPEN 100 UNIT/ML KiwkPen INJECT 15-25 UNITS INTO THE SKIN 3 TIMES W/ MEALS, 10-12 UNITS INTO THE SKIN W/ SNACKS. 10 pen 2  . HYDROcodone-acetaminophen (NORCO/VICODIN) 5-325 MG per tablet Take 1 tablet by mouth every 6 (six) hours as needed for moderate pain. 40 tablet 0  . HYDROcodone-homatropine (HYCODAN) 5-1.5 MG/5ML syrup 1 tsp po at night before bed prn cough 180 mL 0  . Insulin Glargine (BASAGLAR KWIKPEN) 100 UNIT/ML SOPN INJECT 40 UNITS UNDER SKIN AT THE BEGINNING OF YOUR DAY AND 30 UNITS AT BEDTIME 30 mL 5  . insulin lispro (HUMALOG KWIKPEN) 100 UNIT/ML KiwkPen 15-35 units into the skin 3 times w/ meals 30 mL 2  . INVOKANA 300 MG TABS tablet TAKE 1 TABLET BY MOUTH DAILY BEFORE BREAKFAST 30 tablet 11  . levothyroxine (SYNTHROID, LEVOTHROID) 75 MCG tablet TAKE 1 TABLET BY MOUTH ONCE A DAY ON AN EMPTY STOMACH 30 MINUTES BEFORE EATING 30 tablet 5  . metFORMIN (GLUCOPHAGE) 1000 MG tablet TAKE 1 TABLET (1,000 MG TOTAL) BY MOUTH 2 (TWO) TIMES DAILY WITH A MEAL. 60 tablet 11  . naproxen (NAPROSYN) 500  MG tablet TAKE 1 TABLET (500 MG TOTAL) BY MOUTH 2 (TWO) TIMES DAILY WITH A MEAL. 60 tablet 3  . omeprazole (PRILOSEC) 40 MG capsule TAKE 1 CAPSULE (40 MG TOTAL) BY MOUTH DAILY. 30 capsule 11  . tadalafil (CIALIS) 20 MG tablet Take 1 tablet (20 mg total) by mouth as directed. 10 tablet 11  . traMADol (ULTRAM) 50 MG tablet     . vitamin B-12 (CYANOCOBALAMIN) 1000 MCG tablet Take 1,000 mcg by mouth daily.       No current facility-administered medications for this visit.     Allergies  Allergen Reactions  . Codeine     REACTION: anxious    Family History  Problem Relation Age of Onset  . Diabetes Mother   . Colon cancer Father 70  . Diabetes Other   . Colon polyps Brother   . Diabetes Maternal Aunt   . Cancer Cousin        breast cancer    Social History   Social History  . Marital status: Single    Spouse name: N/A  . Number of children:  N/A  . Years of education: N/A   Occupational History  . Operator    Social History Main Topics  . Smoking status: Never Smoker  . Smokeless tobacco: Never Used  . Alcohol use 7.2 oz/week    12 Standard drinks or equivalent per week     Comment: 6 pk every 3 days; 12 pk/week  . Drug use: No  . Sexual activity: Not on file   Other Topics Concern  . Not on file   Social History Narrative  . No narrative on file     Constitutional: Denies fever, malaise, fatigue, headache or abrupt weight changes. .  Gastrointestinal: Pt reports intermittent constipation and rectal pain.  Denies abdominal pain, bloating, diarrhea or blood in the stool.    No other specific complaints in a complete review of systems (except as listed in HPI above).  Objective:   Physical Exam  BP 128/82   Pulse 96   Temp 98.5 F (36.9 C) (Oral)   Wt 246 lb (111.6 kg)   SpO2 97%   BMI 36.33 kg/m  Wt Readings from Last 3 Encounters:  02/14/17 246 lb (111.6 kg)  11/15/16 242 lb 6.4 oz (110 kg)  11/14/16 240 lb (108.9 kg)    General: Appears his stated age,obese in NAD. Rectal: External hemorrhoid noted at 3 oclock. Internal thrombosed hemorrhoid noted a 2 oclock. No rectal bleeding noted.    BMET    Component Value Date/Time   NA 137 09/21/2016 0949   K 4.9 09/21/2016 0949   CL 99 09/21/2016 0949   CO2 31 09/21/2016 0949   GLUCOSE 188 (H) 09/21/2016 0949   BUN 14 09/21/2016 0949   CREATININE 1.00 09/21/2016 0949   CALCIUM 9.5 09/21/2016 0949   GFRNONAA >90 09/23/2012 1200   GFRAA >90 09/23/2012 1200    Lipid Panel     Component Value Date/Time   CHOL 124 09/21/2016 0949   TRIG 211.0 (H) 09/21/2016 0949   HDL 47.60 09/21/2016 0949   CHOLHDL 3 09/21/2016 0949   VLDL 42.2 (H) 09/21/2016 0949   LDLCALC 62 09/30/2013 1004    CBC    Component Value Date/Time   WBC 9.7 09/21/2016 0949   RBC 4.81 09/21/2016 0949   HGB 16.3 09/21/2016 0949   HCT 47.1 09/21/2016 0949   PLT 264.0  09/21/2016 0949   MCV 98.0 09/21/2016 0949  MCHC 34.5 09/21/2016 0949   RDW 12.9 09/21/2016 0949   LYMPHSABS 1.9 09/21/2016 0949   MONOABS 0.9 09/21/2016 0949   EOSABS 0.3 09/21/2016 0949   BASOSABS 0.1 09/21/2016 0949    Hgb A1C Lab Results  Component Value Date   HGBA1C 7.4 11/15/2016            Assessment & Plan:   Rectal Pain secondary to Internal Hemorrhoid:  Advised him to drink plenty of water and take Colace as needed to prevent constipation eRx for Anusol suppositories No fissure noted  Return precautions discussed Webb Silversmith, NP

## 2017-02-14 NOTE — Patient Instructions (Signed)

## 2017-02-15 ENCOUNTER — Telehealth: Payer: Self-pay | Admitting: *Deleted

## 2017-02-15 MED ORDER — TRAMADOL HCL 50 MG PO TABS: 50.0000 mg | ORAL_TABLET | Freq: Two times a day (BID) | ORAL | 0 refills | Status: DC | PRN

## 2017-02-15 NOTE — Telephone Encounter (Signed)
Spoke to pt who states that he was seen yesterday 6/28 for hemorrhoids. He reports being in severe pain and is requesting a refill. Last Rx 2016. pls advise

## 2017-02-15 NOTE — Telephone Encounter (Signed)
Pt notified of Dr. Marliss Coots comments and instructions. Rx for tramadol called in as prescribed.   Pt wanted me to send this message to Jacquelynn Cree since she eval him yesterday. Pt said he is still in sever pain when he is laying down it's managable but it's about a 8 but when he tries to move around the pain goes to a 10. Pt said he tried tramadol after shoulder surgery and one tablet didn't do anything and when he took 2 tablets he felt funny. He request that I send this message to [facility] and ask that she fill his hydrocodone-apap since she saw him and Dr. Marliss Coots not in the office

## 2017-02-15 NOTE — Telephone Encounter (Signed)
Pt notified of Lonzo Cloud NP comments and asked pt if he wanted [place] to do a urgent referral to the GI doctor, pt became upset and said he wasn't in this pain until Rollene Fare pushed on his stomach. I asked pt given his pain did he want [place] to refer him to a GI doctor since that's Webb Silversmith, NP recommendation, he told me he already has a GI doctor. I asked pt if he wanted [place] to put a urgent referral in for him to return there or if he wanted to call the GI doctor himself and let them know what was going on. Pt's response to my questions was "Well I'm just not going to see Rollene Fare ever again."   Message routed to Webb Silversmith, NP and Dr. Glori Bickers as an Jul[facility] Rainier

## 2017-02-15 NOTE — Telephone Encounter (Signed)
noted 

## 2017-02-15 NOTE — Telephone Encounter (Signed)
It looks like tramadol is on his list from another provider ordered 02/15/16 I cannot write a narcotic px (out of town)- but am ok with calling in tramadol Px written for call in   I will be off the computer for the rest of the day after this Thanks

## 2017-02-15 NOTE — Telephone Encounter (Signed)
I am not going to fill Norco for hemorrhoids. If he is in that severe pain, I need to have him see GI ASAP

## 2017-02-17 NOTE — Telephone Encounter (Signed)
Thanks for letting me know- if he wants the GI referral, please let me know

## 2017-02-27 ENCOUNTER — Ambulatory Visit (INDEPENDENT_AMBULATORY_CARE_PROVIDER_SITE_OTHER): Payer: 59 | Admitting: Family Medicine

## 2017-02-27 ENCOUNTER — Encounter: Payer: Self-pay | Admitting: Family Medicine

## 2017-02-27 VITALS — BP 134/78 | HR 81 | Temp 99.1°F | Ht 69.0 in | Wt 247.6 lb

## 2017-02-27 DIAGNOSIS — T63461A Toxic effect of venom of wasps, accidental (unintentional), initial encounter: Secondary | ICD-10-CM | POA: Diagnosis not present

## 2017-02-27 DIAGNOSIS — Z23 Encounter for immunization: Secondary | ICD-10-CM | POA: Diagnosis not present

## 2017-02-27 MED ORDER — MOMETASONE FUROATE 0.1 % EX CREA: 1.0000 "application " | TOPICAL_CREAM | Freq: Every day | CUTANEOUS | 0 refills | Status: DC

## 2017-02-27 NOTE — Progress Notes (Signed)
Subjective:    Patient ID: Mitchell Palmer, male    DOB: April 29, 1966, 51 y.o.   MRN: [id number]  HPI 51 yo diabetic male here with wasp sting on neck   (also to update PNA 23 vaccine)  Stung 7:30 pm Sunday at work  R post neck Red/swollen/painful - got worse and then a little better No stinger left  Cleaned with rubbing alcohol Now just tender   No sob/wheeze or swelling in mouth No hives No hx of sting allergy   utd tetanus shot   Patient Active Problem List   Diagnosis Date Noted  . Wasp sting 02/27/2017  . Conjunctivitis 09/07/2016  . Obesity 01/24/2016  . Hypothyroidism 01/24/2016  . Uncontrolled type 2 diabetes mellitus with stage 3 chronic kidney disease, with long-term current use of insulin ([place]) 07/28/2015  . Alcohol consumption heavy 12/06/2014  . Swelling, mass, or lump in head and neck 06/30/2014  . Necrobiosis diabeticorum ([place]) 03/01/2014  . ETD (eustachian tube dysfunction) 08/22/2012  . Low back pain 06/17/2012  . Abnormal chest x-ray 10/15/2011  . PLANTAR FASCIITIS, TRAUMATIC 11/09/2009  . NEOPLASM UNCERTAIN BEHAVIOR OTHER SPEC SITES 10/07/2009  . ESOPHAGEAL STRICTURE 06/27/2009  . GERD 03/31/2009  . OTHER DYSPHAGIA 03/31/2009  . Hyperlipidemia 01/06/2009  . Essential hypertension 01/06/2009  . ALLERGIC RHINITIS 01/06/2009   Past Medical History:  Diagnosis Date  . Allergy    allegic rhinitis  . Diabetes mellitus    type II  . Esophageal stricture   . GERD (gastroesophageal reflux disease)   . History of hernia repair    as a baby  . Hyperlipidemia   . Hyperplastic polyp of intestine 2010  . Hypertension   . S/P ear surgery, follow-up exam    for drainage   Past Surgical History:  Procedure Laterality Date  . COLONOSCOPY    . ESOPHAGOGASTRODUODENOSCOPY  04/2009   stricture/GERD  . HERNIA REPAIR     age 51-midline  . [place]   went in behind rt ear-blockage  . POLYPECTOMY    . SHOULDER ARTHROSCOPY WITH ROTATOR CUFF  REPAIR AND SUBACROMIAL DECOMPRESSION Right 09/29/2012   Procedure: SHOULDER ARTHROSCOPY WITH ROTATOR CUFF REPAIR AND SUBACROMIAL DECOMPRESSION;  Surgeon: Nita Sells, MD;  Location: [facility];  Service: Orthopedics;  Laterality: Right;  Right shoulder arthroscopy with subacromial decompression and distal clavicle excision & Debridement of Labrial Tear  . UPPER GASTROINTESTINAL ENDOSCOPY     Social History  Substance Use Topics  . Smoking status: Never Smoker  . Smokeless tobacco: Never Used  . Alcohol use 7.2 oz/week    12 Standard drinks or equivalent per week     Comment: 6 pk every 3 days; 12 pk/week   Family History  Problem Relation Age of Onset  . Diabetes Mother   . Colon cancer Father 72  . Diabetes Other   . Colon polyps Brother   . Diabetes Maternal Aunt   . Cancer Cousin        breast cancer   Allergies  Allergen Reactions  . Codeine     REACTION: anxious   Current Outpatient Prescriptions on File Prior to Visit  Medication Sig Dispense Refill  . atorvastatin (LIPITOR) 40 MG tablet TAKE 1 TABLET (40 MG TOTAL) BY MOUTH DAILY. 30 tablet 3  . B-D INS SYR ULTRAFINE 1CC/31G 31G X 5/16" 1 ML MISC INJECT 35 UNITS INTO THE SKIN EVERY 12 HOURS AS DIRECTED BY PHYSICIAN. 200 each 2  . B-D  UF III MINI PEN NEEDLES 31G X 5 MM MISC USE 4 TIMES A DAY 300 each 4  . BENICAR 40 MG tablet TAKE 1 TABLET BY MOUTH EVERY DAY 30 tablet 5  . cyclobenzaprine (FLEXERIL) 10 MG tablet TAKE 1 TABLET BY MOUTH 3 TIMES A DAY AS NEEDED 40 tablet 1  . diclofenac (VOLTAREN) 75 MG EC tablet Reported on 02/15/2016    . fluticasone (FLONASE) 50 MCG/ACT nasal spray USE 2 SPRAYS IN EACH NOSTRIL ONCE A DAY 16 g 5  . glucose blood (ONE TOUCH ULTRA TEST) test strip TEST BLOOD SUGAR 4 TIMES A DAY AS DIRECTED 200 each 11  . HUMALOG KWIKPEN 100 UNIT/ML KiwkPen INJECT 15-25 UNITS INTO THE SKIN 3 TIMES W/ MEALS, 10-12 UNITS INTO THE SKIN W/ SNACKS. 10 pen 2  . HUMALOG KWIKPEN 100 UNIT/ML  KiwkPen INJECT 15-25 UNITS INTO THE SKIN 3 TIMES W/ MEALS, 10-12 UNITS INTO THE SKIN W/ SNACKS. 10 pen 2  . HYDROcodone-acetaminophen (NORCO/VICODIN) 5-325 MG per tablet Take 1 tablet by mouth every 6 (six) hours as needed for moderate pain. 40 tablet 0  . hydrocortisone (ANUSOL-HC) 25 MG suppository Place 1 suppository (25 mg total) rectally 2 (two) times daily. 14 suppository 0  . Insulin Glargine (BASAGLAR KWIKPEN) 100 UNIT/ML SOPN INJECT 40 UNITS UNDER SKIN AT THE BEGINNING OF YOUR DAY AND 30 UNITS AT BEDTIME 30 mL 5  . insulin lispro (HUMALOG KWIKPEN) 100 UNIT/ML KiwkPen 15-35 units into the skin 3 times w/ meals 30 mL 2  . levothyroxine (SYNTHROID, LEVOTHROID) 75 MCG tablet TAKE 1 TABLET BY MOUTH ONCE A DAY ON AN EMPTY STOMACH 30 MINUTES BEFORE EATING 30 tablet 5  . metFORMIN (GLUCOPHAGE) 1000 MG tablet TAKE 1 TABLET (1,000 MG TOTAL) BY MOUTH 2 (TWO) TIMES DAILY WITH A MEAL. 60 tablet 11  . naproxen (NAPROSYN) 500 MG tablet TAKE 1 TABLET (500 MG TOTAL) BY MOUTH 2 (TWO) TIMES DAILY WITH A MEAL. 60 tablet 3  . omeprazole (PRILOSEC) 40 MG capsule TAKE 1 CAPSULE (40 MG TOTAL) BY MOUTH DAILY. 30 capsule 11  . tadalafil (CIALIS) 20 MG tablet Take 1 tablet (20 mg total) by mouth as directed. 10 tablet 11  . traMADol (ULTRAM) 50 MG tablet Take 1-2 tablets (50-100 mg total) by mouth every 12 (twelve) hours as needed for severe pain. 30 tablet 0  . vitamin B-12 (CYANOCOBALAMIN) 1000 MCG tablet Take 1,000 mcg by mouth daily.       No current facility-administered medications on file prior to visit.     Review of Systems Review of Systems  Constitutional: Negative for fever, appetite change, fatigue and unexpected weight change.  Eyes: Negative for pain and visual disturbance.  Respiratory: Negative for cough and shortness of breath.   Cardiovascular: Negative for cp or palpitations    Gastrointestinal: Negative for nausea, diarrhea and constipation.  Genitourinary: Negative for urgency and  frequency.  Skin: Negative for pallor or rash  pos for sting injury on neck Neurological: Negative for weakness, light-headedness, numbness and headaches.  Hematological: Negative for adenopathy. Does not bruise/bleed easily.  Psychiatric/Behavioral: Negative for dysphoric mood. The patient is not nervous/anxious.         Objective:   Physical Exam  Constitutional: He appears well-developed and well-nourished. No distress.  obese and well appearing   HENT:  Head: Normocephalic.  Mouth/Throat: Oropharynx is clear and moist.  No swelling of mouth or throat  Neck: Normal range of motion. Neck supple.  Cardiovascular: Normal rate and regular  rhythm.   Pulmonary/Chest: Effort normal and breath sounds normal. No respiratory distress. He has no wheezes. He has no rales.  Lymphadenopathy:    He has no cervical adenopathy.  Neurological: He is alert. No cranial nerve deficit.  Skin: Skin is warm and dry. No rash noted. There is erythema. No pallor.  1-3 cm area of induration (round) with tiny scab (no fb) on R posterior neck Mildly tender  No fluctuance or streaking  No urticaria   Psychiatric: He has a normal mood and affect.          Assessment & Plan:   Problem List Items Addressed This Visit      Musculoskeletal and Integument   Wasp sting    With local inflammatory reaction  No s/s of allergy or anaphylaxis  Diabetic pt  Cold compress/elocon cream and antihistamine if itchy  Watch for inc size/pain or redness or fever or signs of infection  Update if not starting to improve in a week or if worsening         Other Visit Diagnoses    Need for 23-polyvalent pneumococcal polysaccharide vaccine    -  Primary   Relevant Orders   Pneumococcal polysaccharide vaccine 23-valent greater than or equal to 2yo subcutaneous/IM (Completed)

## 2017-02-27 NOTE — Assessment & Plan Note (Signed)
With local inflammatory reaction  No s/s of allergy or anaphylaxis  Diabetic pt  Cold compress/elocon cream and antihistamine if itchy  Watch for inc size/pain or redness or fever or signs of infection  Update if not starting to improve in a week or if worsening

## 2017-02-27 NOTE — Patient Instructions (Signed)
Keep the sting site clean with soap and water  Use cold compress whenever you can Use the elocon cream once daily until better   If worse/red/swollen/painful or other symptoms let us know  Pneumonia vaccine today to update you

## 2017-03-05 ENCOUNTER — Ambulatory Visit: Payer: 59 | Admitting: Internal Medicine

## 2017-04-15 ENCOUNTER — Other Ambulatory Visit: Payer: Self-pay | Admitting: Internal Medicine

## 2017-04-23 ENCOUNTER — Other Ambulatory Visit: Payer: Self-pay | Admitting: Family Medicine

## 2017-04-24 ENCOUNTER — Other Ambulatory Visit: Payer: Self-pay | Admitting: Internal Medicine

## 2017-04-24 NOTE — Telephone Encounter (Signed)
Pt has had some recent acute appts., but no recent or future f/u or CPE, please advise

## 2017-04-24 NOTE — Telephone Encounter (Signed)
Will refill electronically  

## 2017-05-10 ENCOUNTER — Encounter: Payer: Self-pay | Admitting: Internal Medicine

## 2017-05-10 ENCOUNTER — Ambulatory Visit (INDEPENDENT_AMBULATORY_CARE_PROVIDER_SITE_OTHER): Payer: 59 | Admitting: Internal Medicine

## 2017-05-10 VITALS — BP 140/84 | HR 85 | Ht 69.0 in | Wt 244.0 lb

## 2017-05-10 DIAGNOSIS — E1122 Type 2 diabetes mellitus with diabetic chronic kidney disease: Secondary | ICD-10-CM | POA: Diagnosis not present

## 2017-05-10 DIAGNOSIS — N183 Chronic kidney disease, stage 3 (moderate): Secondary | ICD-10-CM

## 2017-05-10 DIAGNOSIS — Z794 Long term (current) use of insulin: Secondary | ICD-10-CM | POA: Diagnosis not present

## 2017-05-10 DIAGNOSIS — E1165 Type 2 diabetes mellitus with hyperglycemia: Secondary | ICD-10-CM | POA: Diagnosis not present

## 2017-05-10 DIAGNOSIS — Z23 Encounter for immunization: Secondary | ICD-10-CM

## 2017-05-10 DIAGNOSIS — IMO0002 Reserved for concepts with insufficient information to code with codable children: Secondary | ICD-10-CM

## 2017-05-10 LAB — POCT GLYCOSYLATED HEMOGLOBIN (HGB A1C): HEMOGLOBIN A1C: 8

## 2017-05-10 MED ORDER — DULAGLUTIDE 0.75 MG/0.5ML ~~LOC~~ SOAJ: SUBCUTANEOUS | 1 refills | Status: DC

## 2017-05-10 NOTE — Progress Notes (Signed)
Subjective:     Patient ID: Mitchell Palmer, male   DOB: November 25, 1965, 51 y.o.   MRN: [id number]  HPIMr. Radke is a 51 y.o. man, returning for followup for DM2, dx in 2000, uncontrolled, insulin-dependent, with complications (CKD stage 3, erectile dysfunction, mild DR). Last visit 6 mo ago.  Since last visit, he ate more pizza and carbs.  He also stopped Invokana 08/2016 as he had leg tingling >> resolved.  His last hemoglobin A1c was: Lab Results  Component Value Date   HGBA1C 7.4 11/15/2016   HGBA1C 7.6 08/15/2016   HGBA1C 7.7 05/15/2016  He had steroid inj's in the past.  He is on a regimen of - free meds - Metformin 1000 mg 2x a day  - Basaglar 30 units in am and 40 units in pm - Humalog:  15 units with a snack 30 units for a regular meal                         35 units for a larger meal - if you have dessert or fried food He was on: - Cycloset - started 04/22/2013 >> at 1.6 mg daily (tried 3 tabs >> dizziness >> backed off to 2) - Victoza 1.8 mg daily - Actos 45 mg daily - Januvia - glyburide/metformin - Avandia. - Bydureon 2 mg weekly - he hated the thick needle   He is checking his sugars 2-3x a day: - am:  90-162, 173 >> 120-136 >> 99-190 >> 125-150 - 2 hours after b'fast: 120-190 >> n/c >> 190-240 >> n/c  - before lunch: 120-163, 217 >> 150s >> 86-147, 155 >> n/c - 2 hours after lunch: 170-232, 266 >> 175 >> 76, 132-188, 195 >> 185-250, 350 - Before dinner: 200 >> 68-148, 155, 165 (in am) >> n/c - bedtime: 1160-214 >> 180s  >> 154-190 >> n/c - nighttime: 168 >> n/c Lowest: 89 >> 80 >> 68 >> 125.  He has hypoglycemia awareness 80.  Highest 266 >> 250 >> 200s >> 300s .  He works nights: 6 pm - 7:30 am. , 3.5 days a week.  Meals: - 5:30-6 pm (at work): subway sandwich - snack at 9-10 pm: pistachios or 1/2 sandwich; pizza - 4:30 am: coffee - 11 am-12 pm: salad, meat + veggies + rice (leftovers from take out)  - + HL (increased TG). Last lipid panel: Lab  Results  Component Value Date   CHOL 124 09/21/2016   HDL 47.60 09/21/2016   LDLCALC 62 09/30/2013   LDLDIRECT 48.0 09/21/2016   TRIG 211.0 (H) 09/21/2016   CHOLHDL 3 09/21/2016  On Atorvastatin. - last eye exam was 08/2016 >> mild retinopathy, L>R. Dr. Katy Fitch. - No numbness or tingling in his legs. Foot exam performed at last visit >> normal  -He does have CKD - stage 2, last BUN/Cr:  Lab Results  Component Value Date   BUN 14 09/21/2016   CREATININE 1.00 09/21/2016   Lab Results  Component Value Date   MICRALBCREAT 0.9 02/15/2016   MICRALBCREAT 1.0 12/07/2014   MICRALBCREAT 0.5 11/04/2012   On Olmesartan.  He also has a history of hyperlipidemia, hypertension, GERD, esophageal stricture, plantar fasciitis, eustachian tube dysfunction, status post ear surgery, history of increased alcohol use.  He has hypothyroidism - managed by PCP. On LT4 75 mcg daily - started 01/23/2016.  Lab Results  Component Value Date   TSH 1.17 09/21/2016   Review of Systems  Constitutional: no weight  gain/no weight loss, no fatigue, no subjective hyperthermia, no subjective hypothermia Eyes: no blurry vision, no xerophthalmia ENT: no sore throat, no nodules palpated in throat, no dysphagia, no odynophagia, no hoarseness Cardiovascular: no CP/no SOB/no palpitations/no leg swelling Respiratory: no cough/no SOB/no wheezing Gastrointestinal: no N/no V/no D/no C/no acid reflux Musculoskeletal: no muscle aches/no joint aches Skin: no rashes, no hair loss Neurological: no tremors/no numbness/no tingling/no dizziness  I reviewed pt's medications, allergies, PMH, social hx, family hx, and changes were documented in the history of present illness. Otherwise, unchanged from my initial visit note.   Objective:   Physical Exam BP 140/84 (BP Location: Left Arm, Patient Position: Sitting)   Pulse 85   Ht 5\' 9"  (1.753 m)   Wt 244 lb (110.7 kg)   SpO2 97%   BMI 36.03 kg/m  Body mass index is 36.03  kg/m.   Wt Readings from Last 3 Encounters:  05/10/17 244 lb (110.7 kg)  02/27/17 247 lb 9 oz (112.3 kg)  02/14/17 246 lb (111.6 kg)  Constitutional: overweight, in NAD Eyes: PERRLA, EOMI, no exophthalmos ENT: moist mucous membranes, no thyromegaly, no cervical lymphadenopathy Cardiovascular: RRR, No MRG Respiratory: CTA B Gastrointestinal: abdomen soft, NT, ND, BS+ Musculoskeletal: no deformities, strength intact in all 4 Skin: moist, warm, no rashes Neurological: no tremor with outstretched hands, DTR normal in all 4 Assessment:     1. 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 HL    Plan:     Patient with long-standing diabetes, With poor controlwith poor control 2/2 poor diet; on a complex med regimen, including multiple daily insulin injections and Metformin. He came off Invokana around the beginning of the year 2/2 leg tingling.  - at this visit, sugars are higher later in the day >> will try a GLP1 R agonist. Discussed expected benefits and poss. SEs - will also reduce Humalog doses a little as we start Trulicity - I advised him to: Patient Instructions  Please continue: - Metformin 1000 mg 2x a day - Basaglar 30 units in am and 40 units in pm  Please decrease: - Humalog:  15 units with a snack 20-25 units for a regular meal                         30 units for a larger meal - if you have dessert or fried food  Please start Trulicity 2.02 mg weekly. Let me know when you are close to running out to call in the higher dose to your pharmacy (1.5 mg).  Please come back for a follow-up appointment in 3 months with your log.  - today, HbA1c is 8.0% (higher) - continue checking sugars at different times of the day - check 1x a day, rotating checks - advised for yearly eye exams >> he is UTD - given flu shot today - Return to clinic in 3 mo with sugar log  2. HL  - reviewed last  Lipid panel - TG higher - continue statin - discussed the need to reduce pizza  Philemon Kingdom, MD PhD [facility] Endocrinology

## 2017-05-10 NOTE — Patient Instructions (Addendum)
Please continue: - Metformin 1000 mg 2x a day - Basaglar 30 units in am and 40 units in pm  Please decrease: - Humalog:  15 units with a snack 20-25 units for a regular meal                         30 units for a larger meal - if you have dessert or fried food  Please start Trulicity 6.38 mg weekly. Let me know when you are close to running out to call in the higher dose to your pharmacy (1.5 mg).  Please come back for a follow-up appointment in 3 months with your log.

## 2017-05-13 ENCOUNTER — Other Ambulatory Visit: Payer: Self-pay | Admitting: Family Medicine

## 2017-05-22 ENCOUNTER — Other Ambulatory Visit: Payer: Self-pay | Admitting: Internal Medicine

## 2017-06-18 ENCOUNTER — Other Ambulatory Visit: Payer: Self-pay | Admitting: Internal Medicine

## 2017-06-30 ENCOUNTER — Encounter: Payer: Self-pay | Admitting: Internal Medicine

## 2017-07-01 ENCOUNTER — Other Ambulatory Visit: Payer: Self-pay | Admitting: Internal Medicine

## 2017-07-01 MED ORDER — DULAGLUTIDE 1.5 MG/0.5ML ~~LOC~~ SOAJ: SUBCUTANEOUS | 5 refills | Status: DC

## 2017-08-09 ENCOUNTER — Ambulatory Visit: Payer: 59 | Admitting: Internal Medicine

## 2017-09-03 ENCOUNTER — Other Ambulatory Visit: Payer: Self-pay | Admitting: Family Medicine

## 2017-09-03 NOTE — Telephone Encounter (Signed)
Pt had a few acute appts but no recent f/u or CPE please advise

## 2017-09-03 NOTE — Telephone Encounter (Signed)
Please schedule a spring f/u - march or later and refill until then

## 2017-09-09 ENCOUNTER — Other Ambulatory Visit: Payer: Self-pay | Admitting: Family Medicine

## 2017-10-02 ENCOUNTER — Other Ambulatory Visit: Payer: Self-pay | Admitting: Internal Medicine

## 2017-10-13 ENCOUNTER — Other Ambulatory Visit: Payer: Self-pay | Admitting: Internal Medicine

## 2017-10-14 ENCOUNTER — Encounter: Payer: Self-pay | Admitting: Family Medicine

## 2017-10-14 ENCOUNTER — Ambulatory Visit: Payer: 59 | Admitting: Family Medicine

## 2017-10-14 ENCOUNTER — Ambulatory Visit: Payer: Self-pay | Admitting: *Deleted

## 2017-10-14 VITALS — BP 140/78 | HR 112 | Temp 98.3°F | Ht 69.0 in | Wt 247.5 lb

## 2017-10-14 DIAGNOSIS — N183 Chronic kidney disease, stage 3 (moderate): Secondary | ICD-10-CM | POA: Diagnosis not present

## 2017-10-14 DIAGNOSIS — E1122 Type 2 diabetes mellitus with diabetic chronic kidney disease: Secondary | ICD-10-CM | POA: Diagnosis not present

## 2017-10-14 DIAGNOSIS — Z794 Long term (current) use of insulin: Secondary | ICD-10-CM

## 2017-10-14 DIAGNOSIS — IMO0002 Reserved for concepts with insufficient information to code with codable children: Secondary | ICD-10-CM

## 2017-10-14 DIAGNOSIS — E1165 Type 2 diabetes mellitus with hyperglycemia: Secondary | ICD-10-CM

## 2017-10-14 DIAGNOSIS — K649 Unspecified hemorrhoids: Secondary | ICD-10-CM

## 2017-10-14 MED ORDER — HYDROCORTISONE 2.5 % RE CREA: 1.0000 "application " | TOPICAL_CREAM | Freq: Two times a day (BID) | RECTAL | 1 refills | Status: DC | PRN

## 2017-10-14 MED ORDER — HYDROCORTISONE ACETATE 25 MG RE SUPP: 25.0000 mg | Freq: Two times a day (BID) | RECTAL | 1 refills | Status: DC | PRN

## 2017-10-14 NOTE — Patient Instructions (Signed)
Keep taking the stool softener and drink lots of water  Avoid straining if possible  Use the suppositories and cream  Update if not starting to improve in a week or if worsening     Use donut pillow or other padding for sitting

## 2017-10-14 NOTE — Progress Notes (Signed)
Subjective:    Patient ID: Mitchell Palmer, male    DOB: [birth year], 52 y.o.   MRN: [id number]  HPI Here for hemorrhoid pain  Flaring up  On the outside and can feel it  No bleeding  Not itching - just pain    He was constipated about a week ago - he took stool softener  He used 3 of the anusol hc suppositories from last time ( a year ago)   Has not moved bowels in 1 1/2 days  Took stool softener this am   Wt Readings from Last 3 Encounters:  10/14/17 247 lb 8 oz (112.3 kg)  05/10/17 244 lb (110.7 kg)  02/27/17 247 lb 9 oz (112.3 kg)   36.55 kg/m    Remote hx of internal and ext hemorrhoids in the past   Had a GI virus 2 wk ago  Better now   Colonoscopy 7/17 with int hemorrhoids and polyps (5 y f/u)  Patient Active Problem List   Diagnosis Date Noted  . Hemorrhoids 10/14/2017  . Wasp sting 02/27/2017  . Conjunctivitis 09/07/2016  . Obesity 01/24/2016  . Hypothyroidism 01/24/2016  . Uncontrolled type 2 diabetes mellitus with stage 3 chronic kidney disease, with long-term current use of insulin ([place]) 07/28/2015  . Alcohol consumption heavy 12/06/2014  . Swelling, mass, or lump in head and neck 06/30/2014  . Necrobiosis diabeticorum ([place]) 03/01/2014  . ETD (eustachian tube dysfunction) 08/22/2012  . Low back pain 06/17/2012  . Abnormal chest x-ray 10/15/2011  . PLANTAR FASCIITIS, TRAUMATIC 11/09/2009  . NEOPLASM UNCERTAIN BEHAVIOR OTHER SPEC SITES 10/07/2009  . ESOPHAGEAL STRICTURE 06/27/2009  . GERD 03/31/2009  . OTHER DYSPHAGIA 03/31/2009  . Hyperlipidemia 01/06/2009  . Essential hypertension 01/06/2009  . ALLERGIC RHINITIS 01/06/2009   Past Medical History:  Diagnosis Date  . Allergy    allegic rhinitis  . Diabetes mellitus    type II  . Esophageal stricture   . GERD (gastroesophageal reflux disease)   . History of hernia repair    as a baby  . Hyperlipidemia   . Hyperplastic polyp of intestine 2010  . Hypertension   . S/P ear surgery, follow-up  exam    for drainage   Past Surgical History:  Procedure Laterality Date  . COLONOSCOPY    . ESOPHAGOGASTRODUODENOSCOPY  04/2009   stricture/GERD  . HERNIA REPAIR     age 15-midline  . [place]   went in behind rt ear-blockage  . POLYPECTOMY    . SHOULDER ARTHROSCOPY WITH ROTATOR CUFF REPAIR AND SUBACROMIAL DECOMPRESSION Right 09/29/2012   Procedure: SHOULDER ARTHROSCOPY WITH ROTATOR CUFF REPAIR AND SUBACROMIAL DECOMPRESSION;  Surgeon: Nita Sells, MD;  Location: [facility];  Service: Orthopedics;  Laterality: Right;  Right shoulder arthroscopy with subacromial decompression and distal clavicle excision & Debridement of Labrial Tear  . UPPER GASTROINTESTINAL ENDOSCOPY     Social History   Tobacco Use  . Smoking status: Never Smoker  . Smokeless tobacco: Never Used  Substance Use Topics  . Alcohol use: Yes    Alcohol/week: 7.2 oz    Types: 12 Standard drinks or equivalent per week    Comment: 6 pk every 3 days; 12 pk/week  . Drug use: No   Family History  Problem Relation Age of Onset  . Diabetes Mother   . Colon cancer Father 9  . Diabetes Other   . Colon polyps Brother   . Diabetes Maternal Aunt   . Cancer Cousin  breast cancer   Allergies  Allergen Reactions  . Codeine     REACTION: anxious   Current Outpatient Medications on File Prior to Visit  Medication Sig Dispense Refill  . atorvastatin (LIPITOR) 40 MG tablet TAKE 1 TABLET (40 MG TOTAL) BY MOUTH DAILY. 90 tablet 1  . B-D INS SYR ULTRAFINE 1CC/31G 31G X 5/16" 1 ML MISC INJECT 35 UNITS INTO THE SKIN EVERY 12 HOURS AS DIRECTED BY PHYSICIAN. 200 each 2  . B-D UF III MINI PEN NEEDLES 31G X 5 MM MISC USE 4 TIMES A DAY 300 each 3  . CIALIS 20 MG tablet TAKE 1 TABLET (20 MG TOTAL) BY MOUTH AS DIRECTED. 10 tablet 2  . cyclobenzaprine (FLEXERIL) 10 MG tablet TAKE 1 TABLET BY MOUTH 3 TIMES A DAY AS NEEDED 40 tablet 1  . diclofenac (VOLTAREN) 75 MG EC tablet Reported on  02/15/2016    . Dulaglutide (TRULICITY) 1.5 OQ/9.4TM SOPN Inject 1.5 mg weekly under skin 4 pen 5  . fluticasone (FLONASE) 50 MCG/ACT nasal spray USE 2 SPRAYS IN EACH NOSTRIL ONCE A DAY 16 g 5  . glucose blood (ONE TOUCH ULTRA TEST) test strip USE TO TEST BLOOD SUGAR 4 TIMES DAILY AS DIRECTED 200 each 1  . HUMALOG KWIKPEN 100 UNIT/ML KiwkPen INJECT 15-25 UNITS INTO THE SKIN 3 TIMES W/ MEALS, 10-12 UNITS INTO THE SKIN W/ SNACKS. 30 pen 2  . hydrocortisone (ANUSOL-HC) 25 MG suppository PLACE 1 SUPPOSITORY (25 MG TOTAL) RECTALLY 2 (TWO) TIMES DAILY. 14 suppository 0  . Insulin Glargine (BASAGLAR KWIKPEN) 100 UNIT/ML SOPN INJECT 40 UNITS UNDER SKIN AT THE BEGINNING OF YOUR DAY AND 30 UNITS AT BEDTIME 30 pen 5  . insulin lispro (HUMALOG KWIKPEN) 100 UNIT/ML KiwkPen 15-35 units into the skin 3 times w/ meals 30 mL 2  . levothyroxine (SYNTHROID, LEVOTHROID) 75 MCG tablet TAKE 1 TABLET BY MOUTH ONCE A DAY ON AN EMPTY STOMACH 30 MINUTES BEFORE EATING 30 tablet 5  . metFORMIN (GLUCOPHAGE) 1000 MG tablet TAKE 1 TABLET (1,000 MG TOTAL) BY MOUTH 2 (TWO) TIMES DAILY WITH A MEAL. 60 tablet 10  . mometasone (ELOCON) 0.1 % cream Apply 1 application topically daily. To affected area / sting 30 g 0  . naproxen (NAPROSYN) 500 MG tablet TAKE 1 TABLET (500 MG TOTAL) BY MOUTH 2 (TWO) TIMES DAILY WITH A MEAL. 60 tablet 3  . olmesartan (BENICAR) 40 MG tablet TAKE 1 TABLET BY MOUTH EVERY DAY 30 tablet 2  . omeprazole (PRILOSEC) 40 MG capsule TAKE 1 CAPSULE (40 MG TOTAL) BY MOUTH DAILY. 30 capsule 11  . vitamin B-12 (CYANOCOBALAMIN) 1000 MCG tablet Take 1,000 mcg by mouth daily.       No current facility-administered medications on file prior to visit.      Review of Systems  Constitutional: Negative for activity change, appetite change, fatigue, fever and unexpected weight change.  HENT: Negative for congestion, rhinorrhea, sore throat and trouble swallowing.   Eyes: Negative for pain, redness, itching and visual  disturbance.  Respiratory: Negative for cough, chest tightness, shortness of breath and wheezing.   Cardiovascular: Negative for chest pain and palpitations.  Gastrointestinal: Positive for constipation and rectal pain. Negative for abdominal distention, abdominal pain, anal bleeding, blood in stool, diarrhea, nausea and vomiting.  Endocrine: Negative for cold intolerance, heat intolerance, polydipsia and polyuria.  Genitourinary: Negative for difficulty urinating, dysuria, frequency and urgency.  Musculoskeletal: Negative for arthralgias, joint swelling and myalgias.  Skin: Negative for pallor and rash.  Neurological: Negative for dizziness, tremors, weakness, numbness and headaches.  Hematological: Negative for adenopathy. Does not bruise/bleed easily.  Psychiatric/Behavioral: Negative for decreased concentration and dysphoric mood. The patient is not nervous/anxious.        Objective:   Physical Exam  Constitutional: He appears well-developed and well-nourished. No distress.  obese and well appearing   HENT:  Head: Normocephalic and atraumatic.  Eyes: Conjunctivae and EOM are normal. Pupils are equal, round, and reactive to light. No scleral icterus.  Neck: Normal range of motion. Neck supple.  Cardiovascular: Normal rate, regular rhythm and normal heart sounds.  Pulmonary/Chest: Effort normal and breath sounds normal. No respiratory distress. He has no wheezes.  Abdominal: Soft. Bowel sounds are normal. He exhibits no distension and no mass. There is no tenderness. There is no rebound and no guarding.  Genitourinary:  Genitourinary Comments: Several external non thrombosed hemorrhoids (approx 3:00 and 9:00) Mildly tender No bleeding Nl rectal done No prolapsed hemorrhoids    Musculoskeletal: He exhibits no edema.  Lymphadenopathy:    He has no cervical adenopathy.  Neurological: He is alert.  Skin: Skin is warm and dry. No rash noted. No erythema.  Psychiatric: He has a  normal mood and affect.          Assessment & Plan:   Problem List Items Addressed This Visit      Cardiovascular and Mediastinum   Hemorrhoids - Primary    External hemorrhoids are bothersome today  Rev last colonoscopy  Disc imp of avoiding straining-will continue to use stool softener  anusol hc cream and suppositories both px  Cream may help more Disc use of tucks pads otc as well  Update if not starting to improve in a week or if worsening  (esp if pain or signs of thrombosed hem)  Disc use of donut pillow as well         Endocrine   Uncontrolled type 2 diabetes mellitus with stage 3 chronic kidney disease, with long-term current use of insulin ([place])    Continue endocrinology f/u

## 2017-10-14 NOTE — Telephone Encounter (Signed)
Spoke to patient and appointment rescheduled for today at 2:15.

## 2017-10-14 NOTE — Telephone Encounter (Signed)
I will see him then

## 2017-10-14 NOTE — Assessment & Plan Note (Signed)
External hemorrhoids are bothersome today  Rev last colonoscopy  Disc imp of avoiding straining-will continue to use stool softener  anusol hc cream and suppositories both px  Cream may help more Disc use of tucks pads otc as well  Update if not starting to improve in a week or if worsening  (esp if pain or signs of thrombosed hem)  Disc use of donut pillow as well

## 2017-10-14 NOTE — Assessment & Plan Note (Signed)
Continue endocrinology f/u

## 2017-10-14 NOTE — Telephone Encounter (Signed)
Pt states he has a hemorrhoid that is swollen and uncomfortable. Denies constipation or bleeding. Advised sitz baths and taking pain med; ibuprofen or aleve.  Appointment already made for tomorrow.  Reason for Disposition . MODERATE-SEVERE rectal pain (i.e., interferes with school, work, or sleep)  Answer Assessment - Initial Assessment Questions 1. SYMPTOM:  "What's the main symptom you're concerned about?" (e.g., pain, itching, swelling, rash)     uncomfortable 2. ONSET: "When did the ________  start?"     At the end of last week 3. RECTAL PAIN: "Do you have any pain around your rectum?" "How bad is the pain?"  (Scale 1-10; or mild, moderate, severe)  - MILD (1-3): doesn't interfere with normal activities   - MODERATE (4-7): interferes with normal activities or awakens from sleep, limping   - SEVERE (8-10): excruciating pain, unable to have a bowel movement      8 4. RECTAL ITCHING: "Do you have any itching in this area?" "How bad is the itching?"  (Scale 1-10; or mild, moderate, severe)  - MILD - doesn't interfere with normal activities   - MODERATE-SEVERE: interferes with normal activities or awakens from sleep     No itching 5. CONSTIPATION: "Do you have constipation?" If so, "How bad is it?"     Last bm was yesterday, is not constipated 6. CAUSE: "What do you think is causing the anus symptoms?"     hemorrhoid 7. OTHER SYMPTOMS: "Do you have any other symptoms?"  (e.g., rectal bleeding, abdominal pain, vomiting, fever)     none 8. PREGNANCY: "Is there any chance you are pregnant?" "When was your last menstrual period?"     no  Protocols used: RECTAL [facility]

## 2017-10-15 ENCOUNTER — Ambulatory Visit: Payer: 59 | Admitting: Family Medicine

## 2017-10-18 ENCOUNTER — Ambulatory Visit: Payer: 59 | Admitting: Internal Medicine

## 2017-10-21 ENCOUNTER — Ambulatory Visit (INDEPENDENT_AMBULATORY_CARE_PROVIDER_SITE_OTHER): Payer: 59 | Admitting: Internal Medicine

## 2017-10-21 ENCOUNTER — Encounter: Payer: Self-pay | Admitting: Internal Medicine

## 2017-10-21 VITALS — BP 136/74 | HR 97 | Ht 69.0 in | Wt 242.8 lb

## 2017-10-21 DIAGNOSIS — Z794 Long term (current) use of insulin: Secondary | ICD-10-CM | POA: Diagnosis not present

## 2017-10-21 DIAGNOSIS — N183 Chronic kidney disease, stage 3 (moderate): Secondary | ICD-10-CM | POA: Diagnosis not present

## 2017-10-21 DIAGNOSIS — IMO0002 Reserved for concepts with insufficient information to code with codable children: Secondary | ICD-10-CM

## 2017-10-21 DIAGNOSIS — R200 Anesthesia of skin: Secondary | ICD-10-CM | POA: Diagnosis not present

## 2017-10-21 DIAGNOSIS — E1122 Type 2 diabetes mellitus with diabetic chronic kidney disease: Secondary | ICD-10-CM

## 2017-10-21 DIAGNOSIS — E1165 Type 2 diabetes mellitus with hyperglycemia: Secondary | ICD-10-CM

## 2017-10-21 LAB — POCT GLYCOSYLATED HEMOGLOBIN (HGB A1C): HEMOGLOBIN A1C: 7.6

## 2017-10-21 MED ORDER — BASAGLAR KWIKPEN 100 UNIT/ML ~~LOC~~ SOPN: PEN_INJECTOR | SUBCUTANEOUS | 5 refills | Status: DC

## 2017-10-21 NOTE — Progress Notes (Signed)
Subjective:     Patient ID: Mitchell Palmer, male   DOB: Mar 23, 1966, 52 y.o.   MRN: [id number]  HPI Mitchell Palmer is a 52 y.o. man, returning for followup for DM2, dx in 2000, uncontrolled, insulin-dependent, with complications (CKD stage 3, erectile dysfunction, mild DR). Last visit 6 months ago.  His last hemoglobin A1c was: Lab Results  Component Value Date   HGBA1C 8.0 05/10/2017   HGBA1C 7.4 11/15/2016   HGBA1C 7.6 08/15/2016  He had steroid inj's in the past.  He is on: - Metformin 1000 mg 2x a day - Basaglar 30 units in am and 40 units in pm - Humalog:  15 units with a snack 25-30 units for a regular meal                         35 units for a larger meal - if you have dessert or fried food  - Trulicity 1.5 mg weekly - started 04/2017 He was on: - he stopped  Invokana 08/2016 as he had leg tingling >> resolved. - Cycloset - started 04/22/2013 >> at 1.6 mg daily (tried 3 tabs >> dizziness >> backed off to 2) - Victoza 1.8 mg daily - Actos 45 mg daily - Januvia - glyburide/metformin - Avandia. - Bydureon 2 mg weekly - he hated the thick needle   He is checking his sugars 2-3x a day: - before b'fast: 120-136 >> 99-190 >> 125-150 >> 133-155 (25 units)  - 2 h after b'fast: n/c >> 190-240 >> n/c >> 79-85  -honeybun - before lunch: 150s >> 86-147, 155 >> n/c >> 200-220s (20 units) - 2 h after lunch:76, 132-188, 195 >> 185-250, 350 >> 130-145 - Before dinner: 68-148, 155, 165 (in am) >> n/c  - bedtime:180s  >> 154-190 >> n/c >> 130-145 - nighttime: 168 >> n/c Lowest: 125 >> 70s.  He has hypoglycemia awareness in the 80s.  Highest 300s >> 330 (steak with fries).  He works nights: 6 pm - 7:30 am, 3.5 days a week.  Meals: - 5:30-6 pm (at work): subway sandwich - snack at 9-10 pm: pistachios or 1/2 sandwich; pizza - 4:30 am: coffee - 11 am-12 pm: salad, meat + veggies + rice (leftovers from take out)  -+ HL. Last lipid panel: Lab Results  Component Value Date   CHOL 124  09/21/2016   HDL 47.60 09/21/2016   LDLCALC 62 09/30/2013   LDLDIRECT 48.0 09/21/2016   TRIG 211.0 (H) 09/21/2016   CHOLHDL 3 09/21/2016  On Lipitor.  -+ Stage II CKD, last BUN/Cr:  Lab Results  Component Value Date   BUN 14 09/21/2016   CREATININE 1.00 09/21/2016   Lab Results  Component Value Date   MICRALBCREAT 0.9 02/15/2016   MICRALBCREAT 1.0 12/07/2014   MICRALBCREAT 0.5 11/04/2012  On Olmesartan. - - last eye exam was 11/2016.  He has mild retinopathy L>R. Dr. Katy Fitch.  - Denies numbness or tingling in his legs.   He also has a history of HTN, GERD, esophageal stricture, plantar fasciitis, eustachian tube dysfunction - status post ear surgery, history of alcohol abuse.  He has hypothyroidism - managed by PCP. On LT4 75 mcg daily - started 01/23/2016.   Latest TSH was normal: Lab Results  Component Value Date   TSH 1.17 09/21/2016   Review of Systems  Constitutional: no weight gain/no weight loss, no fatigue, no subjective hyperthermia, no subjective hypothermia Eyes: no blurry vision, no xerophthalmia ENT: no sore  throat, no nodules palpated in throat, no dysphagia, no odynophagia, no hoarseness Cardiovascular: no CP/no SOB/no palpitations/no leg swelling Respiratory: no cough/no SOB/no wheezing Gastrointestinal: no N/no V/no D/no C/no acid reflux Musculoskeletal: no muscle aches/no joint aches Skin: no rashes, no hair loss Neurological: no tremors/no numbness/no tingling/no dizziness  I reviewed pt's medications, allergies, PMH, social hx, family hx, and changes were documented in the history of present illness. Otherwise, unchanged from my initial visit note.   Objective:   Physical Exam BP 136/74   Pulse 97   Ht 5\' 9"  (1.753 m)   Wt 242 lb 12.8 oz (110.1 kg)   BMI 35.86 kg/m  Body mass index is 35.86 kg/m.   Wt Readings from Last 3 Encounters:  10/21/17 242 lb 12.8 oz (110.1 kg)  10/14/17 247 lb 8 oz (112.3 kg)  05/10/17 244 lb (110.7 kg)    Constitutional: overweight, in NAD Eyes: PERRLA, EOMI, no exophthalmos ENT: moist mucous membranes, no thyromegaly, no cervical lymphadenopathy Cardiovascular: + tachycardia, RR, No MRG Respiratory: CTA B Gastrointestinal: abdomen soft, NT, ND, BS+ Musculoskeletal: no deformities, strength intact in all 4 Skin: moist, warm, no rashes Neurological: no tremor with outstretched hands, DTR normal in all 4  Diabetic Foot Exam - Simple   Simple Foot Form Diabetic Foot exam was performed with the following findings:  Yes 10/21/2017  4:15 PM  Visual Inspection See comments:  Yes Sensation Testing Intact to touch and monofilament testing bilaterally:  Yes Pulse Check Posterior Tibialis and Dorsalis pulse intact bilaterally:  Yes Comments + calluses on B medial feet   -at the base of his halluces    Assessment:     1. 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 HL    Plan:     Patient with long-standing diabetes, with poor control 2/2 poor diet; on a complex medical regimen, including multiple daily injections, metformin, and added a GLP-1 receptor agonist at last visit (we reduced Humalog doses as we started Trulicity).  He came off Invokana in 2018 2/2 leg tingling. - he did not decrease the dose of Humalog after he started trulicity as advised >> he is dropping his sugars after mealtime insulin >> corrects it with sweets >> increased sugars before next meal. Strongly advised him to decrease his Humalog doses and will also decrease Basaglar. Will continue Trulicity. - I advised him to: Patient Instructions  Please decrease: - Basaglar 30 units 2x a day - Humalog: 15-25 units before meals  - 15 min before eating  Continue: - Metformin 1000 mg 2x a day with meals - Trulicity 1.5 mg weekly  Please return in 3 months with your sugar log. .  - today, HbA1c is 7.6% (better) - continue checking  sugars at different times of the day - check 1x a day, rotating checks - advised for yearly eye exams >> he is UTD - Return to clinic in 4 mo with sugar log   2. HL  -Reviewed latest lipid panel from 09/2016: Triglycerides were higher, LDL great -Continues Lipitor without side effects. -We discussed about improving diet at last visit   3. Numbness in toes - will check a  B12 at the next lab draw  Philemon Kingdom, MD PhD [facility] Endocrinology

## 2017-10-21 NOTE — Patient Instructions (Signed)
Please decrease: - Basaglar 30 units 2x a day - Humalog: 15-25 units before meals  - 15 min before eating  Continue: - Metformin 1000 mg 2x a day with meals - Trulicity 1.5 mg weekly  Please return in 3 months with your sugar log.

## 2017-10-31 ENCOUNTER — Other Ambulatory Visit (INDEPENDENT_AMBULATORY_CARE_PROVIDER_SITE_OTHER): Payer: 59

## 2017-10-31 ENCOUNTER — Telehealth (INDEPENDENT_AMBULATORY_CARE_PROVIDER_SITE_OTHER): Payer: 59 | Admitting: Family Medicine

## 2017-10-31 DIAGNOSIS — I10 Essential (primary) hypertension: Secondary | ICD-10-CM | POA: Diagnosis not present

## 2017-10-31 DIAGNOSIS — E7800 Pure hypercholesterolemia, unspecified: Secondary | ICD-10-CM

## 2017-10-31 DIAGNOSIS — E039 Hypothyroidism, unspecified: Secondary | ICD-10-CM | POA: Diagnosis not present

## 2017-10-31 LAB — COMPREHENSIVE METABOLIC PANEL
ALT: 32 U/L (ref 0–53)
AST: 21 U/L (ref 0–37)
Albumin: 4.1 g/dL (ref 3.5–5.2)
Alkaline Phosphatase: 82 U/L (ref 39–117)
BUN: 9 mg/dL (ref 6–23)
CO2: 31 meq/L (ref 19–32)
Calcium: 9.7 mg/dL (ref 8.4–10.5)
Chloride: 101 mEq/L (ref 96–112)
Creatinine, Ser: 1.07 mg/dL (ref 0.40–1.50)
GFR: 77.55 mL/min (ref >60.00)
GLUCOSE: 200 mg/dL — AB (ref 70–99)
POTASSIUM: 4.8 meq/L (ref 3.5–5.1)
Sodium: 139 mEq/L (ref 135–145)
Total Bilirubin: 0.7 mg/dL (ref 0.2–1.2)
Total Protein: 6.3 g/dL (ref 6.0–8.3)

## 2017-10-31 LAB — CBC WITH DIFFERENTIAL/PLATELET
BASOS ABS: 0.1 10*3/uL (ref 0.0–0.1)
Basophils Relative: 0.8 % (ref 0.0–3.0)
Eosinophils Absolute: 0.4 10*3/uL (ref 0.0–0.7)
Eosinophils Relative: 5.4 % — ABNORMAL HIGH (ref 0.0–5.0)
HCT: 43.5 % (ref 39.0–52.0)
Hemoglobin: 15.2 g/dL (ref 13.0–17.0)
LYMPHS ABS: 2 10*3/uL (ref 0.7–4.0)
Lymphocytes Relative: 26.2 % (ref 12.0–46.0)
MCHC: 35 g/dL (ref 30.0–36.0)
MCV: 98.6 fl (ref 78.0–100.0)
MONO ABS: 0.9 10*3/uL (ref 0.1–1.0)
MONOS PCT: 11.1 % (ref 3.0–12.0)
NEUTROS ABS: 4.4 10*3/uL (ref 1.4–7.7)
NEUTROS PCT: 56.5 % (ref 43.0–77.0)
PLATELETS: 303 10*3/uL (ref 150.0–400.0)
RBC: 4.41 Mil/uL (ref 4.22–5.81)
RDW: 12.7 % (ref 11.5–15.5)
WBC: 7.8 10*3/uL (ref 4.0–10.5)

## 2017-10-31 LAB — LIPID PANEL
CHOL/HDL RATIO: 3
Cholesterol: 110 mg/dL (ref 0–200)
HDL: 38.7 mg/dL — AB (ref >39.00)
LDL Cholesterol: 36 mg/dL (ref 0–99)
NONHDL: 71.5
Triglycerides: 177 mg/dL — ABNORMAL HIGH (ref 0.0–149.0)
VLDL: 35.4 mg/dL (ref 0.0–40.0)

## 2017-10-31 LAB — TSH: TSH: 4.25 u[IU]/mL (ref 0.35–4.50)

## 2017-10-31 NOTE — Telephone Encounter (Signed)
-----   Message from Ellamae Sia sent at 10/31/2017  9:55 AM EDT ----- Regarding: Lab orders for now Lab orders for a f/u appt

## 2017-11-01 ENCOUNTER — Ambulatory Visit: Payer: 59 | Admitting: Family Medicine

## 2017-11-01 ENCOUNTER — Encounter: Payer: Self-pay | Admitting: Family Medicine

## 2017-11-01 VITALS — BP 129/80 | HR 96 | Temp 98.5°F | Ht 69.0 in | Wt 246.5 lb

## 2017-11-01 DIAGNOSIS — N183 Chronic kidney disease, stage 3 (moderate): Secondary | ICD-10-CM

## 2017-11-01 DIAGNOSIS — K219 Gastro-esophageal reflux disease without esophagitis: Secondary | ICD-10-CM

## 2017-11-01 DIAGNOSIS — M545 Low back pain, unspecified: Secondary | ICD-10-CM

## 2017-11-01 DIAGNOSIS — E7800 Pure hypercholesterolemia, unspecified: Secondary | ICD-10-CM

## 2017-11-01 DIAGNOSIS — Z6836 Body mass index (BMI) 36.0-36.9, adult: Secondary | ICD-10-CM

## 2017-11-01 DIAGNOSIS — R202 Paresthesia of skin: Secondary | ICD-10-CM | POA: Diagnosis not present

## 2017-11-01 DIAGNOSIS — Z794 Long term (current) use of insulin: Secondary | ICD-10-CM

## 2017-11-01 DIAGNOSIS — IMO0002 Reserved for concepts with insufficient information to code with codable children: Secondary | ICD-10-CM

## 2017-11-01 DIAGNOSIS — I10 Essential (primary) hypertension: Secondary | ICD-10-CM | POA: Diagnosis not present

## 2017-11-01 DIAGNOSIS — E1165 Type 2 diabetes mellitus with hyperglycemia: Secondary | ICD-10-CM

## 2017-11-01 DIAGNOSIS — G8929 Other chronic pain: Secondary | ICD-10-CM

## 2017-11-01 DIAGNOSIS — E039 Hypothyroidism, unspecified: Secondary | ICD-10-CM | POA: Diagnosis not present

## 2017-11-01 DIAGNOSIS — E1122 Type 2 diabetes mellitus with diabetic chronic kidney disease: Secondary | ICD-10-CM

## 2017-11-01 MED ORDER — LEVOTHYROXINE SODIUM 75 MCG PO TABS: ORAL_TABLET | ORAL | 11 refills | Status: DC

## 2017-11-01 MED ORDER — CYCLOBENZAPRINE HCL 10 MG PO TABS: 10.0000 mg | ORAL_TABLET | Freq: Three times a day (TID) | ORAL | 3 refills | Status: DC | PRN

## 2017-11-01 MED ORDER — OMEPRAZOLE 40 MG PO CPDR: DELAYED_RELEASE_CAPSULE | ORAL | 11 refills | Status: DC

## 2017-11-01 MED ORDER — OLMESARTAN MEDOXOMIL 40 MG PO TABS: 40.0000 mg | ORAL_TABLET | Freq: Every day | ORAL | 11 refills | Status: DC

## 2017-11-01 MED ORDER — ATORVASTATIN CALCIUM 40 MG PO TABS: 40.0000 mg | ORAL_TABLET | Freq: Every day | ORAL | 3 refills | Status: DC

## 2017-11-01 NOTE — Assessment & Plan Note (Signed)
Hypothyroidism  Pt has no clinical changes No change in energy level/ hair or skin/ edema and no tremor Lab Results  Component Value Date   TSH 4.25 10/31/2017

## 2017-11-01 NOTE — Assessment & Plan Note (Signed)
Discussed how this problem influences overall health and the risks it imposes  Reviewed plan for weight loss with lower calorie diet (via better food choices and also portion control or program like weight watchers) and exercise building up to or more than 30 minutes 5 days per week including some aerobic activity    

## 2017-11-01 NOTE — Assessment & Plan Note (Signed)
Sees endocrinology  Some improvement  Lab Results  Component Value Date   HGBA1C 7.6 10/21/2017

## 2017-11-01 NOTE — Assessment & Plan Note (Signed)
Likely from DM neuropathy /early  Would like to check B12 with next labs however

## 2017-11-01 NOTE — Assessment & Plan Note (Signed)
Doing well with omeprazole as long as he avoids cooked tomato sauce and spicy food Refilled  Wt loss enc

## 2017-11-01 NOTE — Assessment & Plan Note (Signed)
bp in fair control at this time  BP Readings from Last 1 Encounters:  11/01/17 129/80   No changes needed Disc lifstyle change with low sodium diet and exercise

## 2017-11-01 NOTE — Progress Notes (Signed)
Subjective:    Patient ID: Mitchell Palmer, male    DOB: 1966-06-29, 52 y.o.   MRN: [id number]  HPI Here for f/u of chronic health problems   Feeling pretty good  Taking care of himself   Wt Readings from Last 3 Encounters:  11/01/17 246 lb 8 oz (111.8 kg)  10/21/17 242 lb 12.8 oz (110.1 kg)  10/14/17 247 lb 8 oz (112.3 kg)  ovese  36.40 kg/m   bp is stable today (on 2nd check BP: 129/80  ) No cp or palpitations or headaches or edema  No side effects to medicines  BP Readings from Last 3 Encounters:  11/01/17 (!) 142/80  10/21/17 136/74  10/14/17 140/78     Takes benicar 40 mg  Lab Results  Component Value Date   CREATININE 1.07 10/31/2017   BUN 9 10/31/2017   NA 139 10/31/2017   K 4.8 10/31/2017   CL 101 10/31/2017   CO2 31 10/31/2017   Lab Results  Component Value Date   ALT 32 10/31/2017   AST 21 10/31/2017   ALKPHOS 82 10/31/2017   BILITOT 0.7 10/31/2017    Alcohol- is a little less than it used to be  When he drinks- 6-12 pack  Many days he does not drink  No longer drinks liquor    Hypothyroidism  Pt has no clinical changes No change in energy level/ hair or skin/ edema and no tremor Lab Results  Component Value Date   TSH 4.25 10/31/2017     DM2 Sees endocrinology Lab Results  Component Value Date   HGBA1C 7.6 10/21/2017  that is an improvement  Eating healthier  Exercise- gets his steps in at work  Will begin yard work soon = although it bothers his back    Hyperlipidemia Lab Results  Component Value Date   CHOL 110 10/31/2017   CHOL 124 09/21/2016   CHOL 133 01/23/2016   Lab Results  Component Value Date   HDL 38.70 (L) 10/31/2017   HDL 47.60 09/21/2016   HDL 35.50 (L) 01/23/2016   Lab Results  Component Value Date   LDLCALC 36 10/31/2017   LDLCALC 62 09/30/2013   LDLCALC 67 07/08/2009   Lab Results  Component Value Date   TRIG 177.0 (H) 10/31/2017   TRIG 211.0 (H) 09/21/2016   TRIG 226.0 (H) 01/23/2016   Lab  Results  Component Value Date   CHOLHDL 3 10/31/2017   CHOLHDL 3 09/21/2016   CHOLHDL 4 01/23/2016   Lab Results  Component Value Date   LDLDIRECT 48.0 09/21/2016   LDLDIRECT 59.0 01/23/2016   LDLDIRECT 72.0 07/22/2015  some improvement with imp on glucose  Takes atorvastatin 40 mg   Takes cialis for ED  Omeprazole for gerd with hx of esoph stricture  Works unless he eats the wrong thing  Cooked tomato sauce sets him off   utd imms and colonoscopy   Patient Active Problem List   Diagnosis Date Noted  . Paresthesia of both feet 11/01/2017  . Hemorrhoids 10/14/2017  . Obesity 01/24/2016  . Hypothyroidism 01/24/2016  . Uncontrolled type 2 diabetes mellitus with stage 3 chronic kidney disease, with long-term current use of insulin ([place]) 07/28/2015  . Alcohol consumption heavy 12/06/2014  . Swelling, mass, or lump in head and neck 06/30/2014  . Necrobiosis diabeticorum ([place]) 03/01/2014  . ETD (eustachian tube dysfunction) 08/22/2012  . Low back pain 06/17/2012  . Abnormal chest x-ray 10/15/2011  . PLANTAR FASCIITIS, TRAUMATIC 11/09/2009  . NEOPLASM  UNCERTAIN BEHAVIOR OTHER SPEC SITES 10/07/2009  . ESOPHAGEAL STRICTURE 06/27/2009  . GERD 03/31/2009  . OTHER DYSPHAGIA 03/31/2009  . Hyperlipidemia 01/06/2009  . Essential hypertension 01/06/2009  . ALLERGIC RHINITIS 01/06/2009   Past Medical History:  Diagnosis Date  . Allergy    allegic rhinitis  . Diabetes mellitus    type II  . Esophageal stricture   . GERD (gastroesophageal reflux disease)   . History of hernia repair    as a baby  . Hyperlipidemia   . Hyperplastic polyp of intestine 2010  . Hypertension   . S/P ear surgery, follow-up exam    for drainage   Past Surgical History:  Procedure Laterality Date  . COLONOSCOPY    . ESOPHAGOGASTRODUODENOSCOPY  04/2009   stricture/GERD  . HERNIA REPAIR     age 54-midline  . [place]   went in behind rt ear-blockage  . POLYPECTOMY    . SHOULDER  ARTHROSCOPY WITH ROTATOR CUFF REPAIR AND SUBACROMIAL DECOMPRESSION Right 09/29/2012   Procedure: SHOULDER ARTHROSCOPY WITH ROTATOR CUFF REPAIR AND SUBACROMIAL DECOMPRESSION;  Surgeon: Nita Sells, MD;  Location: [facility];  Service: Orthopedics;  Laterality: Right;  Right shoulder arthroscopy with subacromial decompression and distal clavicle excision & Debridement of Labrial Tear  . UPPER GASTROINTESTINAL ENDOSCOPY     Social History   Tobacco Use  . Smoking status: Never Smoker  . Smokeless tobacco: Never Used  Substance Use Topics  . Alcohol use: Yes    Alcohol/week: 7.2 oz    Types: 12 Standard drinks or equivalent per week    Comment: 6 pk every 3 days; 12 pk/week  . Drug use: No   Family History  Problem Relation Age of Onset  . Diabetes Mother   . Colon cancer Father 75  . Diabetes Other   . Colon polyps Brother   . Diabetes Maternal Aunt   . Cancer Cousin        breast cancer   Allergies  Allergen Reactions  . Codeine     REACTION: anxious   Current Outpatient Medications on File Prior to Visit  Medication Sig Dispense Refill  . B-D INS SYR ULTRAFINE 1CC/31G 31G X 5/16" 1 ML MISC INJECT 35 UNITS INTO THE SKIN EVERY 12 HOURS AS DIRECTED BY PHYSICIAN. 200 each 2  . B-D UF III MINI PEN NEEDLES 31G X 5 MM MISC USE 4 TIMES A DAY 300 each 3  . CIALIS 20 MG tablet TAKE 1 TABLET (20 MG TOTAL) BY MOUTH AS DIRECTED. 10 tablet 2  . diclofenac (VOLTAREN) 75 MG EC tablet Reported on 02/15/2016    . Dulaglutide (TRULICITY) 1.5 VO/1.6WV SOPN Inject 1.5 mg weekly under skin 4 pen 5  . fluticasone (FLONASE) 50 MCG/ACT nasal spray USE 2 SPRAYS IN EACH NOSTRIL ONCE A DAY 16 g 5  . glucose blood (ONE TOUCH ULTRA TEST) test strip USE TO TEST BLOOD SUGAR 4 TIMES DAILY AS DIRECTED 200 each 1  . HUMALOG KWIKPEN 100 UNIT/ML KiwkPen INJECT 15-25 UNITS INTO THE SKIN 3 TIMES W/ MEALS, 10-12 UNITS INTO THE SKIN W/ SNACKS. 30 pen 2  . hydrocortisone (ANUSOL-HC) 2.5 %  rectal cream Place 1 application rectally 2 (two) times daily as needed for hemorrhoids or anal itching. To affected areas/hemorrhoids 30 g 1  . hydrocortisone (ANUSOL-HC) 25 MG suppository PLACE 1 SUPPOSITORY (25 MG TOTAL) RECTALLY 2 (TWO) TIMES DAILY. 14 suppository 0  . hydrocortisone (ANUSOL-HC) 25 MG suppository Place 1 suppository (  25 mg total) rectally 2 (two) times daily as needed for hemorrhoids or anal itching. 12 suppository 1  . Insulin Glargine (BASAGLAR KWIKPEN) 100 UNIT/ML SOPN INJECT 30 UNITS UNDER SKIN AT THE BEGINNING OF YOUR DAY AND 30 UNITS AT BEDTIME 30 pen 5  . insulin lispro (HUMALOG KWIKPEN) 100 UNIT/ML KiwkPen 15-35 units into the skin 3 times w/ meals 30 mL 2  . metFORMIN (GLUCOPHAGE) 1000 MG tablet TAKE 1 TABLET (1,000 MG TOTAL) BY MOUTH 2 (TWO) TIMES DAILY WITH A MEAL. 60 tablet 10  . mometasone (ELOCON) 0.1 % cream Apply 1 application topically daily. To affected area / sting 30 g 0  . naproxen (NAPROSYN) 500 MG tablet TAKE 1 TABLET (500 MG TOTAL) BY MOUTH 2 (TWO) TIMES DAILY WITH A MEAL. 60 tablet 3  . vitamin B-12 (CYANOCOBALAMIN) 1000 MCG tablet Take 1,000 mcg by mouth daily.       No current facility-administered medications on file prior to visit.     Review of Systems  Constitutional: Negative for activity change, appetite change, fatigue, fever and unexpected weight change.  HENT: Negative for congestion, rhinorrhea, sore throat and trouble swallowing.   Eyes: Negative for pain, redness, itching and visual disturbance.  Respiratory: Negative for cough, chest tightness, shortness of breath and wheezing.   Cardiovascular: Negative for chest pain and palpitations.  Gastrointestinal: Negative for abdominal pain, blood in stool, constipation, diarrhea and nausea.  Endocrine: Negative for cold intolerance, heat intolerance, polydipsia and polyuria.  Genitourinary: Negative for difficulty urinating, dysuria, frequency and urgency.  Musculoskeletal: Positive for back  pain. Negative for arthralgias, joint swelling and myalgias.  Skin: Negative for pallor and rash.  Neurological: Negative for dizziness, tremors, weakness, numbness and headaches.  Hematological: Negative for adenopathy. Does not bruise/bleed easily.  Psychiatric/Behavioral: Negative for decreased concentration and dysphoric mood. The patient is not nervous/anxious.        Objective:   Physical Exam  Constitutional: He appears well-developed and well-nourished. No distress.  obese and well appearing   HENT:  Head: Normocephalic and atraumatic.  Mouth/Throat: Oropharynx is clear and moist.  Eyes: Conjunctivae and EOM are normal. Pupils are equal, round, and reactive to light.  Neck: Normal range of motion. Neck supple. No JVD present. Carotid bruit is not present. No thyromegaly present.  Cardiovascular: Normal rate, regular rhythm, normal heart sounds and intact distal pulses. Exam reveals no gallop.  Pulmonary/Chest: Effort normal and breath sounds normal. No respiratory distress. He has no wheezes. He has no rales.  No crackles  Abdominal: Soft. Bowel sounds are normal. He exhibits no distension, no abdominal bruit and no mass. There is no tenderness.  Musculoskeletal: He exhibits no edema.  No bony LS tenderness Nl gait   Lymphadenopathy:    He has no cervical adenopathy.  Neurological: He is alert. He has normal reflexes. No cranial nerve deficit. He exhibits normal muscle tone. Coordination normal.  Skin: Skin is warm and dry. No rash noted.  Psychiatric: He has a normal mood and affect.          Assessment & Plan:   Problem List Items Addressed This Visit      Cardiovascular and Mediastinum   Essential hypertension - Primary    bp in fair control at this time  BP Readings from Last 1 Encounters:  11/01/17 129/80   No changes needed Disc lifstyle change with low sodium diet and exercise        Relevant Medications   atorvastatin (LIPITOR) 40 MG tablet  olmesartan (BENICAR) 40 MG tablet     Digestive   GERD    Doing well with omeprazole as long as he avoids cooked tomato sauce and spicy food Refilled  Wt loss enc       Relevant Medications   omeprazole (PRILOSEC) 40 MG capsule     Endocrine   Hypothyroidism    Hypothyroidism  Pt has no clinical changes No change in energy level/ hair or skin/ edema and no tremor Lab Results  Component Value Date   TSH 4.25 10/31/2017          Relevant Medications   levothyroxine (SYNTHROID, LEVOTHROID) 75 MCG tablet   Uncontrolled type 2 diabetes mellitus with stage 3 chronic kidney disease, with long-term current use of insulin ([facility])    Sees endocrinology  Some improvement  Lab Results  Component Value Date   HGBA1C 7.6 10/21/2017         Relevant Medications   atorvastatin (LIPITOR) 40 MG tablet   olmesartan (BENICAR) 40 MG tablet     Other   Hyperlipidemia    Overall stable Disc goals for lipids and reasons to control them Rev labs with pt Rev low sat fat diet in detail Continue atorvastatin       Relevant Medications   atorvastatin (LIPITOR) 40 MG tablet   olmesartan (BENICAR) 40 MG tablet   Low back pain    Worse with yard work Handout for back rehab given-stretches and exercises      Relevant Medications   cyclobenzaprine (FLEXERIL) 10 MG tablet   Obesity    Discussed how this problem influences overall health and the risks it imposes  Reviewed plan for weight loss with lower calorie diet (via better food choices and also portion control or program like weight watchers) and exercise building up to or more than 30 minutes 5 days per week including some aerobic activity         Paresthesia of both feet    Likely from DM neuropathy /early  Would like to check B12 with next labs however

## 2017-11-01 NOTE — Assessment & Plan Note (Signed)
Worse with yard work Handout for back rehab given-stretches and exercises

## 2017-11-01 NOTE — Patient Instructions (Signed)
Take care of yourself  Eat a healthy diabetic diet  Try to minimize alcohol   Stay active   Try the back stretches/exercises

## 2017-11-01 NOTE — Assessment & Plan Note (Signed)
Overall stable Disc goals for lipids and reasons to control them Rev labs with pt Rev low sat fat diet in detail Continue atorvastatin

## 2017-11-18 ENCOUNTER — Telehealth: Payer: Self-pay | Admitting: *Deleted

## 2017-11-18 NOTE — Telephone Encounter (Signed)
Received fax from [facility] saying that olmesartan 40 mg is on back order and request an alt Rx sent to pharmacy

## 2017-11-18 NOTE — Telephone Encounter (Signed)
Please ask what other ARBs they have /are covered Was going to sub losartan but that has been on back order in some pharmacies  Thanks

## 2017-11-19 MED ORDER — LOSARTAN POTASSIUM 100 MG PO TABS: 100.0000 mg | ORAL_TABLET | Freq: Every day | ORAL | 11 refills | Status: DC

## 2017-11-19 NOTE — Telephone Encounter (Signed)
Spoke with [place] (pharmacist) and she said they do have losartan in that's not on the recall but a lot of pt's are afraid to try that med because if everything they have seen on the news. [place] Campbell said also some providers are switching to lisinopril in place of the benicar or losartan

## 2017-11-19 NOTE — Telephone Encounter (Signed)
My first choice is losartan  I want to stick with ARB If any problems please let me know  I will send it to the pharmacy

## 2017-11-20 DIAGNOSIS — E113393 Type 2 diabetes mellitus with moderate nonproliferative diabetic retinopathy without macular edema, bilateral: Secondary | ICD-10-CM | POA: Diagnosis not present

## 2017-11-20 DIAGNOSIS — H2513 Age-related nuclear cataract, bilateral: Secondary | ICD-10-CM | POA: Diagnosis not present

## 2017-11-20 LAB — HM DIABETES EYE EXAM

## 2017-12-03 DIAGNOSIS — H4311 Vitreous hemorrhage, right eye: Secondary | ICD-10-CM | POA: Diagnosis not present

## 2017-12-03 DIAGNOSIS — E113592 Type 2 diabetes mellitus with proliferative diabetic retinopathy without macular edema, left eye: Secondary | ICD-10-CM | POA: Diagnosis not present

## 2017-12-03 DIAGNOSIS — E113591 Type 2 diabetes mellitus with proliferative diabetic retinopathy without macular edema, right eye: Secondary | ICD-10-CM | POA: Diagnosis not present

## 2017-12-11 ENCOUNTER — Encounter: Payer: Self-pay | Admitting: Ophthalmology

## 2017-12-14 ENCOUNTER — Other Ambulatory Visit: Payer: Self-pay | Admitting: Internal Medicine

## 2017-12-16 DIAGNOSIS — E113592 Type 2 diabetes mellitus with proliferative diabetic retinopathy without macular edema, left eye: Secondary | ICD-10-CM | POA: Diagnosis not present

## 2018-01-12 ENCOUNTER — Other Ambulatory Visit: Payer: Self-pay | Admitting: Family Medicine

## 2018-01-14 DIAGNOSIS — H1131 Conjunctival hemorrhage, right eye: Secondary | ICD-10-CM | POA: Diagnosis not present

## 2018-01-14 NOTE — Telephone Encounter (Signed)
Diclofenac was a historical med, it hasn't been prescribed since 2017, removed from med list

## 2018-01-14 NOTE — Telephone Encounter (Signed)
voltaren (diclofenac) is also on med list  Please make sure he is not taking both   If not -remove it   Refill req med times 6 mo

## 2018-01-14 NOTE — Telephone Encounter (Signed)
Last OV was 11/01/17, last filled on 01/25/17 #60 tabs with 3 additional refills

## 2018-01-23 ENCOUNTER — Other Ambulatory Visit: Payer: Self-pay | Admitting: Internal Medicine

## 2018-01-27 ENCOUNTER — Encounter: Payer: Self-pay | Admitting: Internal Medicine

## 2018-01-27 ENCOUNTER — Ambulatory Visit (INDEPENDENT_AMBULATORY_CARE_PROVIDER_SITE_OTHER): Payer: 59 | Admitting: Internal Medicine

## 2018-01-27 ENCOUNTER — Other Ambulatory Visit: Payer: Self-pay | Admitting: Internal Medicine

## 2018-01-27 VITALS — BP 142/82 | HR 70 | Ht 69.0 in | Wt 242.4 lb

## 2018-01-27 DIAGNOSIS — IMO0002 Reserved for concepts with insufficient information to code with codable children: Secondary | ICD-10-CM

## 2018-01-27 DIAGNOSIS — E7800 Pure hypercholesterolemia, unspecified: Secondary | ICD-10-CM | POA: Diagnosis not present

## 2018-01-27 DIAGNOSIS — Z794 Long term (current) use of insulin: Secondary | ICD-10-CM | POA: Diagnosis not present

## 2018-01-27 DIAGNOSIS — N183 Chronic kidney disease, stage 3 (moderate): Secondary | ICD-10-CM | POA: Diagnosis not present

## 2018-01-27 DIAGNOSIS — E1122 Type 2 diabetes mellitus with diabetic chronic kidney disease: Secondary | ICD-10-CM

## 2018-01-27 DIAGNOSIS — Z6836 Body mass index (BMI) 36.0-36.9, adult: Secondary | ICD-10-CM | POA: Diagnosis not present

## 2018-01-27 DIAGNOSIS — E1165 Type 2 diabetes mellitus with hyperglycemia: Secondary | ICD-10-CM | POA: Diagnosis not present

## 2018-01-27 LAB — POCT GLYCOSYLATED HEMOGLOBIN (HGB A1C): HEMOGLOBIN A1C: 7.4 % — AB (ref 4.0–5.6)

## 2018-01-27 MED ORDER — BASAGLAR KWIKPEN 100 UNIT/ML ~~LOC~~ SOPN: PEN_INJECTOR | SUBCUTANEOUS | 5 refills | Status: DC

## 2018-01-27 NOTE — Progress Notes (Signed)
Subjective:     Patient ID: Mitchell Palmer, male   DOB: 10/15/1965, 52 y.o.   MRN: [id number]  HPI Mitchell Palmer is a 52 y.o. man, returning for followup for DM2, dx in 2000, uncontrolled, insulin-dependent, with complications (CKD stage 3, erectile dysfunction, mild DR). Last visit 3 months ago.  His last hemoglobin A1c was: Lab Results  Component Value Date   HGBA1C 7.6 10/21/2017   HGBA1C 8.0 05/10/2017   HGBA1C 7.4 11/15/2016  He had steroid inj's in the past.  He is on: - Metformin 1000 mg 2x a day with meals - Trulicity 1.5 mg weekly - started 04/2017 - Basaglar 30 units 2x a day - Humalog: 20-25 units before meals  - 15 min before eating He was on: - he stopped  Invokana 08/2016 as he had leg tingling >> resolved. - Cycloset - started 04/22/2013 >> at 1.6 mg daily (tried 3 tabs >> dizziness >> backed off to 2) - Victoza 1.8 mg daily - Actos 45 mg daily - Januvia - glyburide/metformin - Avandia. - Bydureon 2 mg weekly - he hated the thick needle   He is checking his sugars 2-4x a day: - before b'fast: 125-150 >> 133-155 (25 units) >> 118-188  - 2 h after b'fast: 190-240 >> n/c >> 79-85  -honeybun >> 181-215 - before lunch:86-147, 155 >> n/c >> 200-220s (20 units) >> 73-166 - 2 h after lunch: 185-250, 350 >> 130-145 >> 171-184 - Before dinner: 68-148, 155, 165 (in am) >> n/c >> 84, 155-190 - bedtime:180s  >> 154-190 >> n/c >> 130-145 >> 164-210, 225 - nighttime: 168 >> n/c >> 74, 112-199 Lowest: 125 >> 70s >> 74.  He has hypoglycemia awareness in the 80s Highest 300s >> 330 (steak with fries) >> 232.  He works nights: 6 pm - 7:30 am, 3.5 days a week.  Meals: - 5:30-6 pm (at work): subway sandwich - snack at 9-10 pm: pistachios or 1/2 sandwich; pizza - 4:30 am: coffee - 11 am-12 pm: salad, meat + veggies + rice (leftovers from take out), pizza rarely  -+ HL. Last lipid panel: Lab Results  Component Value Date   CHOL 110 10/31/2017   HDL 38.70 (L) 10/31/2017   LDLCALC 36 10/31/2017   LDLDIRECT 48.0 09/21/2016   TRIG 177.0 (H) 10/31/2017   CHOLHDL 3 10/31/2017  On Lipitor.  -+ Stage II CKD, last BUN/Cr:  Lab Results  Component Value Date   BUN 9 10/31/2017   CREATININE 1.07 10/31/2017   Lab Results  Component Value Date   MICRALBCREAT 0.9 02/15/2016   MICRALBCREAT 1.0 12/07/2014   MICRALBCREAT 0.5 11/04/2012  On olmesartan.  - last eye exam was 11/2017: + DR.  Dr. Katy Fitch >> now Dr. Zadie Rhine. Laser sx in both eyes for bleeding vessels.  -Denies numbness or tingling in his legs.   He also has a history of HTN, GERD, esophageal stricture, plantar fasciitis, eustachian tube dysfunction - status post ear surgery, history of alcohol abuse.  He has hypothyroidism -managed by PCP. On LT4 75 mcg daily -started 01/2016.  He tells me that he is occasional skipping doses.  Latest TSH was normal: Lab Results  Component Value Date   TSH 4.25 10/31/2017   Review of Systems  Constitutional: no weight gain/+ weight loss, no fatigue, no subjective hyperthermia, no subjective hypothermia Eyes: no blurry vision, no xerophthalmia ENT: no sore throat, no nodules palpated in throat, no dysphagia, no odynophagia, no hoarseness Cardiovascular: no CP/no SOB/no palpitations/no leg swelling  Respiratory: no cough/no SOB/no wheezing Gastrointestinal: no N/no V/no D/no C/no acid reflux Musculoskeletal: no muscle aches/no joint aches Skin: no rashes, no hair loss Neurological: no tremors/no numbness/no tingling/no dizziness  I reviewed pt's medications, allergies, PMH, social hx, family hx, and changes were documented in the history of present illness. Otherwise, unchanged from my initial visit note.   Objective:   Physical Exam BP (!) 142/82   Pulse 70   Ht 5\' 9"  (1.753 m)   Wt 242 lb 6.4 oz (110 kg)   SpO2 96%   BMI 35.80 kg/m  Body mass index is 35.8 kg/m.   Wt Readings from Last 3 Encounters:  01/27/18 242 lb 6.4 oz (110 kg)  11/01/17 246 lb 8  oz (111.8 kg)  10/21/17 242 lb 12.8 oz (110.1 kg)   Constitutional: overweight, in NAD Eyes: PERRLA, EOMI, no exophthalmos ENT: moist mucous membranes, no thyromegaly, no cervical lymphadenopathy Cardiovascular: RRR, No MRG Respiratory: CTA B Gastrointestinal: abdomen soft, NT, ND, BS+ Musculoskeletal: no deformities, strength intact in all 4 Skin: moist, warm, no rashes Neurological: no tremor with outstretched hands, DTR normal in all 4  Assessment:     1. 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 HL  3. Numbness in toes    Plan:     Patient with long-standing, uncontrolled diabetes, due to poor diet; on a complex medical regimen, including multiple daily injections, metformin, and GLP-1 receptor agonist.  He came off Invokana 04/2017 due to leg tingling.  At last visit, as he did not decrease the Humalog doses after he started Trulicity as advised, he was dropping his sugars after mealtime insulin and he was correcting them with sweets.  Therefore, his sugars were high before the next meal.  We decreased his Humalog doses and also the Basaglar dose.  We continued Trulicity - At this visit, sugars are slightly better, but still above goal, the majority of the time.  It is difficult to interpret the results and his sugar log as he groups them together based on the time the of the day and not necessarily based on his d circadian pattern.  Therefore, for example, the sugars in the morning could be either fasting in the days that he is now working at night or at the end of the day, after dinner, if he works that particular night. - Sugars are consistently high before dinner, so we discussed about increasing Basaglar in the morning, but they are also mostly high in a.m., so we will also increase Basaglar at dinnertime.  No other changes are needed.  He did start to change his diet which I encouraged him to  continue. - I advised him to: Patient Instructions  Please continue: - Metformin 1000 mg 2x a day with meals - Trulicity 1.5 mg weekly  - Humalog: 20-25 units before meals  - 15 min before eating  Please increase: - Basaglar 35 units 2x a day  Please return in 3-4 months with your sugar log.   - today, HbA1c is 7.4% (better) - continue checking sugars at different times of the day - check 3x a day, rotating checks - advised for yearly eye exams >> he is UTD - Return to clinic in 3-4 mo with sugar log   2. HL  - Reviewed latest lipid panel from 10/2017: LDL at goal, triglycerides slightly high - Continues Lipitor without side effects.  3. Numbness in toes -  B12 level was ordered at last visit, but not drawn - Will need to draw this with next set of labs  Philemon Kingdom, MD PhD [facility] Endocrinology

## 2018-01-27 NOTE — Patient Instructions (Signed)
Please continue: - Metformin 1000 mg 2x a day with meals - Trulicity 1.5 mg weekly  - Humalog: 20-25 units before meals  - 15 min before eating  Please increase: - Basaglar 35 units 2x a day  Please return in 3-4 months with your sugar log.

## 2018-01-27 NOTE — Addendum Note (Signed)
Addended by: Drucilla Schmidt on: 01/27/2018 04:26 PM   Modules accepted: Orders

## 2018-02-06 DIAGNOSIS — E113591 Type 2 diabetes mellitus with proliferative diabetic retinopathy without macular edema, right eye: Secondary | ICD-10-CM | POA: Diagnosis not present

## 2018-02-06 DIAGNOSIS — H4311 Vitreous hemorrhage, right eye: Secondary | ICD-10-CM | POA: Diagnosis not present

## 2018-03-25 DIAGNOSIS — E113591 Type 2 diabetes mellitus with proliferative diabetic retinopathy without macular edema, right eye: Secondary | ICD-10-CM | POA: Diagnosis not present

## 2018-03-25 DIAGNOSIS — E113592 Type 2 diabetes mellitus with proliferative diabetic retinopathy without macular edema, left eye: Secondary | ICD-10-CM | POA: Diagnosis not present

## 2018-03-25 DIAGNOSIS — H4311 Vitreous hemorrhage, right eye: Secondary | ICD-10-CM | POA: Diagnosis not present

## 2018-03-25 LAB — HM DIABETES EYE EXAM

## 2018-03-26 ENCOUNTER — Ambulatory Visit: Payer: 59 | Admitting: Family Medicine

## 2018-03-26 ENCOUNTER — Encounter: Payer: Self-pay | Admitting: Family Medicine

## 2018-03-26 VITALS — BP 144/88 | HR 83 | Temp 98.4°F | Ht 69.0 in | Wt 248.2 lb

## 2018-03-26 DIAGNOSIS — B880 Other acariasis: Secondary | ICD-10-CM | POA: Diagnosis not present

## 2018-03-26 DIAGNOSIS — R202 Paresthesia of skin: Secondary | ICD-10-CM | POA: Diagnosis not present

## 2018-03-26 DIAGNOSIS — Z23 Encounter for immunization: Secondary | ICD-10-CM

## 2018-03-26 DIAGNOSIS — Z1159 Encounter for screening for other viral diseases: Secondary | ICD-10-CM | POA: Diagnosis not present

## 2018-03-26 LAB — VITAMIN B12: Vitamin B-12: 581 pg/mL (ref 211–911)

## 2018-03-26 MED ORDER — MOMETASONE FUROATE 0.1 % EX CREA: 1.0000 "application " | TOPICAL_CREAM | Freq: Every day | CUTANEOUS | 1 refills | Status: DC

## 2018-03-26 NOTE — Assessment & Plan Note (Signed)
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

## 2018-03-26 NOTE — Assessment & Plan Note (Signed)
B12 level today  May be from diabetes also -working on control

## 2018-03-26 NOTE — Progress Notes (Signed)
Subjective:    Patient ID: Mitchell Palmer, male    DOB: [birth year], 52 y.o.   MRN: [id number]  HPI Here for c/o rash on both feet (some on legs as well as a few on buttocks)   ? 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 From DM or B12  Feet burn at times Not numb  Has d/w Dr Cruzita Lederer - also has DM Lab Results  Component Value Date   HGBA1C 7.4 (A) 01/27/2018   Fair control-continues to work on it  Has calluses - concern for how to care for them/work on them   Patient Active Problem List   Diagnosis Date Noted  . Need for hepatitis B screening test 03/26/2018  . Need for hepatitis C screening test 03/26/2018  . Chigger bites 03/26/2018  . Paresthesia of both feet 11/01/2017  . Hemorrhoids 10/14/2017  . Obesity 01/24/2016  . Hypothyroidism 01/24/2016  . Uncontrolled type 2 diabetes mellitus with stage 3 chronic kidney disease, with long-term current use of insulin ([place]) 07/28/2015  . Alcohol consumption heavy 12/06/2014  . Necrobiosis diabeticorum ([place]) 03/01/2014  . ETD (eustachian tube dysfunction) 08/22/2012  . Low back pain 06/17/2012  . Abnormal chest x-ray 10/15/2011  . PLANTAR FASCIITIS, TRAUMATIC 11/09/2009  . NEOPLASM UNCERTAIN BEHAVIOR OTHER SPEC SITES 10/07/2009  . ESOPHAGEAL STRICTURE 06/27/2009  . GERD 03/31/2009  . OTHER DYSPHAGIA 03/31/2009  . Hyperlipidemia 01/06/2009  . Essential hypertension 01/06/2009  . ALLERGIC  RHINITIS 01/06/2009   Past Medical History:  Diagnosis Date  . Allergy    allegic rhinitis  . Diabetes mellitus    type II  . Esophageal stricture   . GERD (gastroesophageal reflux disease)   . History of hernia repair    as a baby  . Hyperlipidemia   . Hyperplastic polyp of intestine 2010  . Hypertension   . S/P ear surgery, follow-up exam    for drainage   Past Surgical History:  Procedure Laterality Date  . COLONOSCOPY    . ESOPHAGOGASTRODUODENOSCOPY  04/2009   stricture/GERD  . HERNIA REPAIR     age 22-midline  . [place]   went in behind rt ear-blockage  . POLYPECTOMY    . SHOULDER ARTHROSCOPY WITH ROTATOR CUFF REPAIR AND SUBACROMIAL DECOMPRESSION Right 09/29/2012   Procedure: SHOULDER ARTHROSCOPY WITH ROTATOR CUFF REPAIR AND SUBACROMIAL DECOMPRESSION;  Surgeon: Nita Sells, MD;  Location: [facility];  Service: Orthopedics;  Laterality: Right;  Right shoulder arthroscopy with subacromial decompression and distal clavicle excision & Debridement of Labrial Tear  . UPPER GASTROINTESTINAL ENDOSCOPY     Social History   Tobacco Use  . Smoking status: Never Smoker  . Smokeless tobacco: Never Used  Substance Use Topics  . Alcohol use: Yes  Alcohol/week: 7.2 oz    Types: 12 Standard drinks or equivalent per week    Comment: 6 pk every 3 days; 12 pk/week  . Drug use: No   Family History  Problem Relation Age of Onset  . Diabetes Mother   . Colon cancer Father 54  . Diabetes Other   . Colon polyps Brother   . Diabetes Maternal Aunt   . Cancer Cousin        breast cancer   Allergies  Allergen Reactions  . Codeine     REACTION: anxious   Current Outpatient Medications on File Prior to Visit  Medication Sig Dispense Refill  . atorvastatin (LIPITOR) 40 MG tablet Take 1 tablet (40 mg total) by mouth daily. 90 tablet 3  . B-D INS SYR ULTRAFINE 1CC/31G 31G X 5/16" 1 ML MISC INJECT 35 UNITS INTO THE SKIN EVERY 12 HOURS AS  DIRECTED BY PHYSICIAN. 200 each 2  . B-D UF III MINI PEN NEEDLES 31G X 5 MM MISC USE 4 TIMES A DAY 300 each 3  . CIALIS 20 MG tablet TAKE 1 TABLET (20 MG TOTAL) BY MOUTH AS DIRECTED. 10 tablet 2  . cyclobenzaprine (FLEXERIL) 10 MG tablet Take 1 tablet (10 mg total) by mouth 3 (three) times daily as needed. 30 tablet 3  . diclofenac (VOLTAREN) 75 MG EC tablet Reported on 02/15/2016    . fluticasone (FLONASE) 50 MCG/ACT nasal spray USE 2 SPRAYS IN EACH NOSTRIL ONCE A DAY 16 g 5  . glucose blood (ONE TOUCH ULTRA TEST) test strip USE TO TEST BLOOD SUGAR 4 TIMES DAILY AS DIRECTED 200 each 1  . HUMALOG KWIKPEN 100 UNIT/ML KiwkPen INJECT 15-25 UNITS INTO THE SKIN 3 TIMES W/ MEALS, 10-12 UNITS INTO THE SKIN W/ SNACKS. 30 mL 2  . hydrocortisone (ANUSOL-HC) 2.5 % rectal cream Place 1 application rectally 2 (two) times daily as needed for hemorrhoids or anal itching. To affected areas/hemorrhoids 30 g 1  . hydrocortisone (ANUSOL-HC) 25 MG suppository Place 1 suppository (25 mg total) rectally 2 (two) times daily as needed for hemorrhoids or anal itching. 12 suppository 1  . Insulin Glargine (BASAGLAR KWIKPEN) 100 UNIT/ML SOPN INJECT 35 UNITS UNDER SKIN AT THE BEGINNING OF YOUR DAY AND 30 UNITS AT BEDTIME 30 pen 5  . insulin lispro (HUMALOG KWIKPEN) 100 UNIT/ML KiwkPen 15-35 units into the skin 3 times w/ meals 30 mL 2  . levothyroxine (SYNTHROID, LEVOTHROID) 75 MCG tablet TAKE 1 TABLET BY MOUTH ONCE A DAY ON AN EMPTY STOMACH 30 MINUTES BEFORE EATING 30 tablet 11  . losartan (COZAAR) 100 MG tablet Take 1 tablet (100 mg total) by mouth daily. 30 tablet 11  . metFORMIN (GLUCOPHAGE) 1000 MG tablet TAKE 1 TABLET (1,000 MG TOTAL) BY MOUTH 2 (TWO) TIMES DAILY WITH A MEAL. 60 tablet 10  . naproxen (NAPROSYN) 500 MG tablet Take 1 tablet (500 mg total) by mouth 2 (two) times daily with a meal. As needed 60 tablet 5  . olmesartan (BENICAR) 40 MG tablet Take 40 mg by mouth daily.  11  . omeprazole (PRILOSEC) 40 MG capsule  TAKE 1 CAPSULE (40 MG TOTAL) BY MOUTH DAILY. 30 capsule 11  . TRULICITY 1.5 BS/9.6GE SOPN INJECT 1.5 MG (1 PEN) WEEKLY UNDER SKIN 2 pen 5  . vitamin B-12 (CYANOCOBALAMIN) 1000 MCG tablet Take 1,000 mcg by mouth daily.       No current facility-administered medications on file prior to visit.     Review of Systems  Constitutional: Negative for activity change, appetite change, fatigue, fever and unexpected weight change.  HENT: Negative for congestion, rhinorrhea, sore throat and trouble swallowing.   Eyes: Negative for pain, redness, itching and visual disturbance.  Respiratory: Negative for cough, chest tightness, shortness of breath and wheezing.   Cardiovascular: Negative for chest pain and palpitations.  Gastrointestinal: Negative for abdominal pain, blood in stool, constipation, diarrhea and nausea.  Endocrine: Negative for cold intolerance, heat intolerance, polydipsia and polyuria.  Genitourinary: Negative for difficulty urinating, dysuria, frequency and urgency.  Musculoskeletal: Negative for arthralgias, joint swelling and myalgias.  Skin: Negative for pallor and rash.       Insect bites with itching   Neurological: Negative for dizziness, tremors, weakness, numbness and headaches.  Hematological: Negative for adenopathy. Does not bruise/bleed easily.  Psychiatric/Behavioral: Negative for decreased concentration and dysphoric mood. The patient is not nervous/anxious.        Objective:   Physical Exam  Constitutional: He is oriented to person, place, and time. He appears well-developed and well-nourished. No distress.  obese and well appearing   HENT:  Head: Normocephalic and atraumatic.  Eyes: Pupils are equal, round, and reactive to light. Conjunctivae and EOM are normal.  Neck: Normal range of motion. Neck supple.  Cardiovascular: Normal rate, regular rhythm and normal heart sounds.  Pulmonary/Chest: Effort normal and breath sounds normal. No respiratory distress. He has  no wheezes.  Musculoskeletal: He exhibits no edema.  Lymphadenopathy:    He has no cervical adenopathy.  Neurological: He is alert and oriented to person, place, and time. Coordination normal.  Skin: Skin is warm and dry.  Insect (chigger) bites on ankles and lower legs- small 1-2 mm bright red /occ drain clear fluid after scratching  Few on upper legs   No signs of infection   No rash or other bites   Psychiatric: He has a normal mood and affect.          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

## 2018-03-26 NOTE — Assessment & Plan Note (Signed)
Work exposure to [employer]- recommend he be screened and immunized  Lab done  Hep A/B imm given

## 2018-03-26 NOTE — Assessment & Plan Note (Signed)
Feet and legs (worse R) after working in hay  tx with mometasone cream  Antihistamine prn  Keep cool Disc avoidance of bites with spray/clothing  Update if not starting to improve in a week or if worsening

## 2018-03-26 NOTE — Patient Instructions (Signed)
We will do hepatitis screening today  Also check a B12 level   Immunization for hepatitis A and B today   Use the mometasone cream as needed to chigger bites  Keep clean with soap and water  Use insect repellent and /or clothing to avoid these in future

## 2018-03-27 LAB — HEPATITIS PANEL, ACUTE
HEP B C IGM: NONREACTIVE
Hep A IgM: NONREACTIVE
Hepatitis B Surface Ag: NONREACTIVE
Hepatitis C Ab: NONREACTIVE
SIGNAL TO CUT-OFF: 0.01 (ref <1.00)

## 2018-04-03 ENCOUNTER — Encounter: Payer: Self-pay | Admitting: Family Medicine

## 2018-04-03 DIAGNOSIS — E113592 Type 2 diabetes mellitus with proliferative diabetic retinopathy without macular edema, left eye: Secondary | ICD-10-CM | POA: Diagnosis not present

## 2018-04-03 DIAGNOSIS — E113591 Type 2 diabetes mellitus with proliferative diabetic retinopathy without macular edema, right eye: Secondary | ICD-10-CM | POA: Diagnosis not present

## 2018-04-18 ENCOUNTER — Other Ambulatory Visit: Payer: Self-pay | Admitting: Internal Medicine

## 2018-04-18 MED ORDER — BASAGLAR KWIKPEN 100 UNIT/ML ~~LOC~~ SOPN: PEN_INJECTOR | SUBCUTANEOUS | 5 refills | Status: DC

## 2018-04-28 ENCOUNTER — Ambulatory Visit: Payer: 59 | Admitting: Family Medicine

## 2018-04-28 ENCOUNTER — Telehealth: Payer: Self-pay | Admitting: Family Medicine

## 2018-04-28 ENCOUNTER — Encounter: Payer: Self-pay | Admitting: Family Medicine

## 2018-04-28 ENCOUNTER — Other Ambulatory Visit: Payer: Self-pay | Admitting: Internal Medicine

## 2018-04-28 ENCOUNTER — Ambulatory Visit (INDEPENDENT_AMBULATORY_CARE_PROVIDER_SITE_OTHER)
Admission: RE | Admit: 2018-04-28 | Discharge: 2018-04-28 | Disposition: A | Discharge status: Home / Self Care | Payer: 59 | Source: Ambulatory Visit | Attending: Family Medicine | Admitting: Family Medicine

## 2018-04-28 VITALS — BP 126/84 | HR 69 | Temp 98.4°F | Ht 69.0 in | Wt 246.5 lb

## 2018-04-28 DIAGNOSIS — M545 Low back pain, unspecified: Secondary | ICD-10-CM

## 2018-04-28 DIAGNOSIS — M47812 Spondylosis without myelopathy or radiculopathy, cervical region: Secondary | ICD-10-CM | POA: Diagnosis not present

## 2018-04-28 DIAGNOSIS — M542 Cervicalgia: Secondary | ICD-10-CM

## 2018-04-28 DIAGNOSIS — M47816 Spondylosis without myelopathy or radiculopathy, lumbar region: Secondary | ICD-10-CM | POA: Diagnosis not present

## 2018-04-28 DIAGNOSIS — Z23 Encounter for immunization: Secondary | ICD-10-CM | POA: Diagnosis not present

## 2018-04-28 DIAGNOSIS — G8929 Other chronic pain: Secondary | ICD-10-CM

## 2018-04-28 NOTE — Assessment & Plan Note (Signed)
Main symptom is tingling of lower legs with neck flexion (when quick) -much of the time fleeting  occ neck discomfort  Supportive pillow helps  H/o deg disc dz in cervical spine- MRI several years ago Xray today  Reassuring exam  Plan to follow with rad rev

## 2018-04-28 NOTE — Assessment & Plan Note (Signed)
Bilateral but worse on the R  Worst-stairs  No radiculopathy (but pt does have tingling in legs with neck flexion) Xray today  Recommend heat 10 min prn Given rehab exercises/stretches Flexeril prn qhs  Naproxen prn bid  Plan with rad review

## 2018-04-28 NOTE — Telephone Encounter (Signed)
-----   Message from Tammi Sou, [place] sent at 04/28/2018  4:34 PM EDT ----- Pt notified of xray results and Dr. Marliss Coots comments off of mychart. Pt agrees with Ortho referral he doesn't have a preference either [place] or [place] is fine he just wants to see whatever Ortho doc Dr. Glori Bickers would recommend to treat this issue. Please put referral in and I advise pt our [facility] will call to schedule appt

## 2018-04-28 NOTE — Patient Instructions (Signed)
Try heat on your back 10 minutes at a time when you can   Take a look at the back stretches  Xray of neck and low back today  We will contact you with results and a plan

## 2018-04-28 NOTE — Progress Notes (Signed)
Subjective:    Patient ID: Mitchell Palmer, male    DOB: 09-07-1965, 52 y.o.   MRN: [id number]  HPI  Here with neck pain today  Also low back pain   Also needs hepatitis vaccines  First was 8/7 Would like a flu shot also  Wt Readings from Last 3 Encounters:  04/28/18 246 lb 8 oz (111.8 kg)  03/26/18 248 lb 4 oz (112.6 kg)  01/27/18 242 lb 6.4 oz (110 kg)   36.40 kg/m   When he bends his neck forward-it makes his feet tingle   Had knot on his neck years ago - C4,5,6- deteriorating  (MRI)-- (when seeing ENT)  Low back bothers him as well  Has indention around tailbone (lifelong)   L leg is perhaps a little weaker- (for years)   Pain is a little bit sharp/occ throbbing   Low back hurts worse to sit for long periods of time  Has never seen orthopedics  In bed at night- neck hurts sometimes  occ keeps him awake  Has a supportive pillow   Back hurts if he lies on his side / better to be flat  He does some stretching Takes flexeril as needed (helps with sleep) Naproxen for work   Patient Active Problem List   Diagnosis Date Noted  . Neck pain 04/28/2018  . Need for hepatitis B screening test 03/26/2018  . Need for hepatitis C screening test 03/26/2018  . Chigger bites 03/26/2018  . Paresthesia of both feet 11/01/2017  . Hemorrhoids 10/14/2017  . Obesity 01/24/2016  . Hypothyroidism 01/24/2016  . Uncontrolled type 2 diabetes mellitus with stage 3 chronic kidney disease, with long-term current use of insulin ([place]) 07/28/2015  . Alcohol consumption heavy 12/06/2014  . Necrobiosis diabeticorum ([place]) 03/01/2014  . ETD (eustachian tube dysfunction) 08/22/2012  . Low back pain 06/17/2012  . Abnormal chest x-ray 10/15/2011  . PLANTAR FASCIITIS, TRAUMATIC 11/09/2009  . NEOPLASM UNCERTAIN BEHAVIOR OTHER SPEC SITES 10/07/2009  . ESOPHAGEAL STRICTURE 06/27/2009  . GERD 03/31/2009  . OTHER DYSPHAGIA 03/31/2009  . Hyperlipidemia 01/06/2009  . Essential hypertension  01/06/2009  . ALLERGIC RHINITIS 01/06/2009   Past Medical History:  Diagnosis Date  . Allergy    allegic rhinitis  . Diabetes mellitus    type II  . Esophageal stricture   . GERD (gastroesophageal reflux disease)   . History of hernia repair    as a baby  . Hyperlipidemia   . Hyperplastic polyp of intestine 2010  . Hypertension   . S/P ear surgery, follow-up exam    for drainage   Past Surgical History:  Procedure Laterality Date  . COLONOSCOPY    . ESOPHAGOGASTRODUODENOSCOPY  04/2009   stricture/GERD  . HERNIA REPAIR     age 7-midline  . [place]   went in behind rt ear-blockage  . POLYPECTOMY    . SHOULDER ARTHROSCOPY WITH ROTATOR CUFF REPAIR AND SUBACROMIAL DECOMPRESSION Right 09/29/2012   Procedure: SHOULDER ARTHROSCOPY WITH ROTATOR CUFF REPAIR AND SUBACROMIAL DECOMPRESSION;  Surgeon: Nita Sells, MD;  Location: [facility];  Service: Orthopedics;  Laterality: Right;  Right shoulder arthroscopy with subacromial decompression and distal clavicle excision & Debridement of Labrial Tear  . UPPER GASTROINTESTINAL ENDOSCOPY     Social History   Tobacco Use  . Smoking status: Never Smoker  . Smokeless tobacco: Never Used  Substance Use Topics  . Alcohol use: Yes    Alcohol/week: 12.0 standard drinks    Types:  12 Standard drinks or equivalent per week    Comment: 6 pk every 3 days; 12 pk/week  . Drug use: No   Family History  Problem Relation Age of Onset  . Diabetes Mother   . Colon cancer Father 73  . Diabetes Other   . Colon polyps Brother   . Diabetes Maternal Aunt   . Cancer Cousin        breast cancer   Allergies  Allergen Reactions  . Codeine     REACTION: anxious   Current Outpatient Medications on File Prior to Visit  Medication Sig Dispense Refill  . atorvastatin (LIPITOR) 40 MG tablet Take 1 tablet (40 mg total) by mouth daily. 90 tablet 3  . B-D INS SYR ULTRAFINE 1CC/31G 31G X 5/16" 1 ML MISC INJECT 35  UNITS INTO THE SKIN EVERY 12 HOURS AS DIRECTED BY PHYSICIAN. 200 each 2  . B-D UF III MINI PEN NEEDLES 31G X 5 MM MISC USE 4 TIMES A DAY 300 each 3  . CIALIS 20 MG tablet TAKE 1 TABLET (20 MG TOTAL) BY MOUTH AS DIRECTED. 10 tablet 2  . cyclobenzaprine (FLEXERIL) 10 MG tablet Take 1 tablet (10 mg total) by mouth 3 (three) times daily as needed. 30 tablet 3  . diclofenac (VOLTAREN) 75 MG EC tablet Reported on 02/15/2016    . fluticasone (FLONASE) 50 MCG/ACT nasal spray USE 2 SPRAYS IN EACH NOSTRIL ONCE A DAY 16 g 5  . HUMALOG KWIKPEN 100 UNIT/ML KiwkPen INJECT 15-25 UNITS INTO THE SKIN 3 TIMES W/ MEALS, 10-12 UNITS INTO THE SKIN W/ SNACKS. 30 mL 2  . hydrocortisone (ANUSOL-HC) 2.5 % rectal cream Place 1 application rectally 2 (two) times daily as needed for hemorrhoids or anal itching. To affected areas/hemorrhoids 30 g 1  . hydrocortisone (ANUSOL-HC) 25 MG suppository Place 1 suppository (25 mg total) rectally 2 (two) times daily as needed for hemorrhoids or anal itching. 12 suppository 1  . Insulin Glargine (BASAGLAR KWIKPEN) 100 UNIT/ML SOPN INJECT 35 UNITS UNDER SKIN AT THE BEGINNING OF YOUR DAY AND 30 UNITS AT BEDTIME 30 pen 5  . insulin lispro (HUMALOG KWIKPEN) 100 UNIT/ML KiwkPen 15-35 units into the skin 3 times w/ meals 30 mL 2  . levothyroxine (SYNTHROID, LEVOTHROID) 75 MCG tablet TAKE 1 TABLET BY MOUTH ONCE A DAY ON AN EMPTY STOMACH 30 MINUTES BEFORE EATING 30 tablet 11  . losartan (COZAAR) 100 MG tablet Take 1 tablet (100 mg total) by mouth daily. 30 tablet 11  . metFORMIN (GLUCOPHAGE) 1000 MG tablet TAKE 1 TABLET (1,000 MG TOTAL) BY MOUTH 2 (TWO) TIMES DAILY WITH A MEAL. 60 tablet 10  . mometasone (ELOCON) 0.1 % cream Apply 1 application topically daily. To affected areas 30 g 1  . naproxen (NAPROSYN) 500 MG tablet Take 1 tablet (500 mg total) by mouth 2 (two) times daily with a meal. As needed 60 tablet 5  . olmesartan (BENICAR) 40 MG tablet Take 40 mg by mouth daily.  11  . omeprazole  (PRILOSEC) 40 MG capsule TAKE 1 CAPSULE (40 MG TOTAL) BY MOUTH DAILY. 30 capsule 11  . TRULICITY 1.5 JI/9.6VE SOPN INJECT 1.5 MG (1 PEN) WEEKLY UNDER SKIN 2 pen 5  . vitamin B-12 (CYANOCOBALAMIN) 1000 MCG tablet Take 1,000 mcg by mouth daily.       No current facility-administered medications on file prior to visit.      Review of Systems  Constitutional: Negative for activity change, appetite change, fatigue, fever and unexpected  weight change.  HENT: Negative for congestion, rhinorrhea, sore throat and trouble swallowing.   Eyes: Negative for pain, redness, itching and visual disturbance.  Respiratory: Negative for cough, chest tightness, shortness of breath and wheezing.   Cardiovascular: Negative for chest pain and palpitations.  Gastrointestinal: Negative for abdominal pain, blood in stool, constipation, diarrhea and nausea.  Endocrine: Negative for cold intolerance, heat intolerance, polydipsia and polyuria.  Genitourinary: Negative for difficulty urinating, dysuria, frequency and urgency.  Musculoskeletal: Positive for back pain and neck pain. Negative for arthralgias, joint swelling, myalgias and neck stiffness.  Skin: Negative for pallor and rash.  Neurological: Negative for dizziness, tremors, weakness, numbness and headaches.       Tingling in legs with change in position of neck   Hematological: Negative for adenopathy. Does not bruise/bleed easily.  Psychiatric/Behavioral: Negative for decreased concentration and dysphoric mood. The patient is not nervous/anxious.        Objective:   Physical Exam  Constitutional: He appears well-developed and well-nourished. No distress.  obese and well appearing   HENT:  Head: Normocephalic and atraumatic.  Eyes: Pupils are equal, round, and reactive to light. Conjunctivae and EOM are normal. No scleral icterus.  Neck: Normal range of motion. Neck supple. No JVD present. No tracheal deviation present. No thyromegaly present.  Nl rom of  neck  No crepitus  Some discomfort on full flexion  No bony tenderness  Some trapezius tenderness    Cardiovascular: Normal rate, regular rhythm and normal heart sounds.  Pulmonary/Chest: Effort normal and breath sounds normal. No respiratory distress. He has no wheezes. He has no rales.  Abdominal: Soft. Bowel sounds are normal. He exhibits no distension. There is no tenderness.  Musculoskeletal: He exhibits tenderness. He exhibits no edema or deformity.       Right shoulder: He exhibits decreased range of motion and tenderness. He exhibits no bony tenderness, no swelling, no crepitus, normal pulse and normal strength.       Lumbar back: He exhibits tenderness and spasm. He exhibits no bony tenderness and no edema.  LS- some R peri lumbar muscular tightness/tenderness Pain to flex past 90 deg  Otherwise nl rom  No boy/spine tenderness  Lymphadenopathy:    He has no cervical adenopathy.  Neurological: He is alert. He has normal strength and normal reflexes. He displays no atrophy and normal reflexes. No cranial nerve deficit or sensory deficit. He exhibits normal muscle tone. Coordination normal.  Negative SLR  Skin: Skin is warm and dry. No rash noted. No erythema. No pallor.  Psychiatric: He has a normal mood and affect.  Pleasant           Assessment & Plan:   Problem List Items Addressed This Visit      Other   Low back pain - Primary    Bilateral but worse on the R  Worst-stairs  No radiculopathy (but pt does have tingling in legs with neck flexion) Xray today  Recommend heat 10 min prn Given rehab exercises/stretches Flexeril prn qhs  Naproxen prn bid  Plan with rad review       Relevant Orders   DG Lumbar Spine Complete   Neck pain    Main symptom is tingling of lower legs with neck flexion (when quick) -much of the time fleeting  occ neck discomfort  Supportive pillow helps  H/o deg disc dz in cervical spine- MRI several years ago Xray today  Reassuring  exam  Plan to follow with rad rev  Relevant Orders   DG Cervical Spine Complete

## 2018-04-29 ENCOUNTER — Ambulatory Visit: Payer: 59

## 2018-05-04 ENCOUNTER — Other Ambulatory Visit: Payer: Self-pay | Admitting: Internal Medicine

## 2018-05-09 DIAGNOSIS — M545 Low back pain: Secondary | ICD-10-CM | POA: Diagnosis not present

## 2018-05-09 DIAGNOSIS — M542 Cervicalgia: Secondary | ICD-10-CM | POA: Diagnosis not present

## 2018-05-16 ENCOUNTER — Other Ambulatory Visit: Payer: Self-pay | Admitting: Orthopedic Surgery

## 2018-05-16 DIAGNOSIS — M545 Low back pain, unspecified: Secondary | ICD-10-CM

## 2018-05-16 DIAGNOSIS — M542 Cervicalgia: Secondary | ICD-10-CM

## 2018-05-21 ENCOUNTER — Ambulatory Visit (INDEPENDENT_AMBULATORY_CARE_PROVIDER_SITE_OTHER): Payer: 59 | Admitting: Internal Medicine

## 2018-05-21 ENCOUNTER — Encounter: Payer: Self-pay | Admitting: Internal Medicine

## 2018-05-21 VITALS — BP 138/88 | HR 72 | Ht 69.0 in | Wt 246.0 lb

## 2018-05-21 DIAGNOSIS — E1122 Type 2 diabetes mellitus with diabetic chronic kidney disease: Secondary | ICD-10-CM

## 2018-05-21 DIAGNOSIS — Z794 Long term (current) use of insulin: Secondary | ICD-10-CM | POA: Diagnosis not present

## 2018-05-21 DIAGNOSIS — N183 Chronic kidney disease, stage 3 (moderate): Secondary | ICD-10-CM

## 2018-05-21 DIAGNOSIS — E7800 Pure hypercholesterolemia, unspecified: Secondary | ICD-10-CM | POA: Diagnosis not present

## 2018-05-21 DIAGNOSIS — E039 Hypothyroidism, unspecified: Secondary | ICD-10-CM

## 2018-05-21 DIAGNOSIS — E1165 Type 2 diabetes mellitus with hyperglycemia: Secondary | ICD-10-CM

## 2018-05-21 DIAGNOSIS — IMO0002 Reserved for concepts with insufficient information to code with codable children: Secondary | ICD-10-CM

## 2018-05-21 LAB — POCT GLYCOSYLATED HEMOGLOBIN (HGB A1C): Hemoglobin A1C: 8.2 % — AB (ref 4.0–5.6)

## 2018-05-21 NOTE — Patient Instructions (Addendum)
Please continue: - Metformin 1000 mg 2x a day with meals - Trulicity 1.5 mg weekly  - Basaglar 35 units 2x a day (take this consistently) - Humalog: 20-25 units before meals  - 15 min before eating  Stop Liquor.  Stop chips and fries.  Please return in 3-4 months with your sugar log.

## 2018-05-21 NOTE — Progress Notes (Signed)
Subjective:     Patient ID: Mitchell Palmer, male   DOB: 04-Apr-1966, 52 y.o.   MRN: [id number]  HPI Mitchell Palmer is a 52 y.o. man, returning for followup for DM2, dx in 2000, uncontrolled, insulin-dependent, with complications (CKD stage 3, erectile dysfunction, mild DR). Last visit 4 months ago.  Since last visit, he has more neck and lower back pain.  He is also drinking more liquor and eating more fast food, fried foods and pizza. Sugars are worse.  His last hemoglobin A1c was: Lab Results  Component Value Date   HGBA1C 7.4 (A) 01/27/2018   HGBA1C 7.6 10/21/2017   HGBA1C 8.0 05/10/2017  He had steroid inj's in the past.  He is on: - Metformin 1000 mg 2x a day with meals - Trulicity 1.5 mg weekly  - Basaglar 35 units 2x a day (skips second dose if CBG <140) - Humalog: 20-25 units before meals  - 15 min before eating He was on: - he stopped  Invokana 08/2016 as he had leg tingling >> resolved. - Cycloset - started 04/22/2013 >> at 1.6 mg daily (tried 3 tabs >> dizziness >> backed off to 2) - Victoza 1.8 mg daily - Actos 45 mg daily - Januvia - glyburide/metformin - Avandia. - Bydureon 2 mg weekly - he hated the thick needle   He is checking his sugars 2-4 times a day - before b'fast: 133-155 (25 units) >> 118-188 >> when comes from work: 125, 180-200 - 2 h after b'fast:  79-85  -honeybun >> 181-215 >> n/c - before lunch: 200-220s (20 units) >> 73-166 >> n/c - 2 h after lunch:  130-145 >> 171-184 - Before dinner:n/c >> 84, 155-190 >> waking up: 125-245 - nighttime: 168 >> n/c >> 74, 112-199 >> at work 80, 140-180 Lowest: 74 >> 79;  He has hypoglycemia awareness in the 80s. Highest 232 >> 250.  He works nights: 6 pm - 7:30 am, 3.5 days a week.  Meals: - 5:30-6 pm (at work): subway sandwich >> 2 hot dogs + doritos - snack at 9-10 pm: pistachios or 1/2 sandwich; pizza  >> 12 am: nabs +peanuts - 11 am-12 pm: salad, meat + veggies + rice (leftovers from take out), pizza rarely  >> pizza  Trying to eat an apple a day.  -+ HL. Last lipid panel: Lab Results  Component Value Date   CHOL 110 10/31/2017   HDL 38.70 (L) 10/31/2017   LDLCALC 36 10/31/2017   LDLDIRECT 48.0 09/21/2016   TRIG 177.0 (H) 10/31/2017   CHOLHDL 3 10/31/2017  On Lipitor.  -+ Stage II CKD, last BUN/Cr:  Lab Results  Component Value Date   BUN 9 10/31/2017   CREATININE 1.07 10/31/2017   Lab Results  Component Value Date   MICRALBCREAT 0.9 02/15/2016   MICRALBCREAT 1.0 12/07/2014   MICRALBCREAT 0.5 11/04/2012  On olmesartan.  - last eye exam was 03/2018: + DR.  Dr. Katy Fitch >> now Dr. Zadie Rhine.  He had laser surgery in both eyes for bleeding vessels.  -No numbness or tingling in his legs.   He also has a history of HTN, GERD, esophageal stricture, plantar fasciitis, eustachian tube dysfunction - status post ear surgery, history of alcohol abuse.  He has hypothyroidism-managed by PCP. On LT4 75 mcg daily -started 01/2016.  At last visit, he was telling me that he was occasionally skipping doses.  I strongly advised him not to do so  Latest TSH was normal: Lab Results  Component Value Date  TSH 4.25 10/31/2017   Review of Systems  Constitutional: no weight gain/no weight loss, no fatigue, no subjective hyperthermia, no subjective hypothermia Eyes: no blurry vision, no xerophthalmia ENT: no sore throat, no nodules palpated in throat, no dysphagia, no odynophagia, no hoarseness Cardiovascular: no CP/no SOB/no palpitations/no leg swelling Respiratory: no cough/no SOB/no wheezing Gastrointestinal: no N/no V/no D/no C/no acid reflux Musculoskeletal: no muscle aches/no joint aches Skin: no rashes, no hair loss Neurological: no tremors/no numbness/no tingling/no dizziness  I reviewed pt's medications, allergies, PMH, social hx, family hx, and changes were documented in the history of present illness. Otherwise, unchanged from my initial visit note.  Past Medical History:  Diagnosis  Date  . Allergy    allegic rhinitis  . Diabetes mellitus    type II  . Esophageal stricture   . GERD (gastroesophageal reflux disease)   . History of hernia repair    as a baby  . Hyperlipidemia   . Hyperplastic polyp of intestine 2010  . Hypertension   . S/P ear surgery, follow-up exam    for drainage   Past Surgical History:  Procedure Laterality Date  . COLONOSCOPY    . ESOPHAGOGASTRODUODENOSCOPY  04/2009   stricture/GERD  . HERNIA REPAIR     age 81-midline  . [place]   went in behind rt ear-blockage  . POLYPECTOMY    . SHOULDER ARTHROSCOPY WITH ROTATOR CUFF REPAIR AND SUBACROMIAL DECOMPRESSION Right 09/29/2012   Procedure: SHOULDER ARTHROSCOPY WITH ROTATOR CUFF REPAIR AND SUBACROMIAL DECOMPRESSION;  Surgeon: Nita Sells, MD;  Location: [facility];  Service: Orthopedics;  Laterality: Right;  Right shoulder arthroscopy with subacromial decompression and distal clavicle excision & Debridement of Labrial Tear  . UPPER GASTROINTESTINAL ENDOSCOPY     Social History   Socioeconomic History  . Marital status: Single    Spouse name: Not on file  . Number of children: Not on file  . Years of education: Not on file  . Highest education level: Not on file  Occupational History  . Occupation: Mining engineer  Social Needs  . Financial resource strain: Not on file  . Food insecurity:    Worry: Not on file    Inability: Not on file  . Transportation needs:    Medical: Not on file    Non-medical: Not on file  Tobacco Use  . Smoking status: Never Smoker  . Smokeless tobacco: Never Used  Substance and Sexual Activity  . Alcohol use: Yes    Alcohol/week: 12.0 standard drinks    Types: 12 Standard drinks or equivalent per week    Comment: 6 pk every 3 days; 12 pk/week  . Drug use: No  . Sexual activity: Not on file  Lifestyle  . Physical activity:    Days per week: Not on file    Minutes per session: Not on file  . Stress: Not on file   Relationships  . Social connections:    Talks on phone: Not on file    Gets together: Not on file    Attends religious service: Not on file    Active member of club or organization: Not on file    Attends meetings of clubs or organizations: Not on file    Relationship status: Not on file  . Intimate partner violence:    Fear of current or ex partner: Not on file    Emotionally abused: Not on file    Physically abused: Not on file    Forced  sexual activity: Not on file  Other Topics Concern  . Not on file  Social History Narrative  . Not on file   Current Outpatient Medications on File Prior to Visit  Medication Sig Dispense Refill  . atorvastatin (LIPITOR) 40 MG tablet Take 1 tablet (40 mg total) by mouth daily. 90 tablet 3  . B-D INS SYR ULTRAFINE 1CC/31G 31G X 5/16" 1 ML MISC INJECT 35 UNITS INTO THE SKIN EVERY 12 HOURS AS DIRECTED BY PHYSICIAN. 200 each 2  . B-D UF III MINI PEN NEEDLES 31G X 5 MM MISC USE 4 TIMES A DAY 300 each 3  . CIALIS 20 MG tablet TAKE 1 TABLET (20 MG TOTAL) BY MOUTH AS DIRECTED. 10 tablet 2  . cyclobenzaprine (FLEXERIL) 10 MG tablet Take 1 tablet (10 mg total) by mouth 3 (three) times daily as needed. 30 tablet 3  . diclofenac (VOLTAREN) 75 MG EC tablet Reported on 02/15/2016    . fluticasone (FLONASE) 50 MCG/ACT nasal spray USE 2 SPRAYS IN EACH NOSTRIL ONCE A DAY 16 g 5  . glucose blood (ONE TOUCH ULTRA TEST) test strip USE TO TEST BLOOD SUGAR 4 TIMES DAILY AS DIRECTED 100 each 3  . HUMALOG KWIKPEN 100 UNIT/ML KiwkPen INJECT 15-25 UNITS INTO THE SKIN 3 TIMES W/ MEALS, 10-12 UNITS INTO THE SKIN W/ SNACKS. 30 mL 2  . hydrocortisone (ANUSOL-HC) 2.5 % rectal cream Place 1 application rectally 2 (two) times daily as needed for hemorrhoids or anal itching. To affected areas/hemorrhoids 30 g 1  . hydrocortisone (ANUSOL-HC) 25 MG suppository Place 1 suppository (25 mg total) rectally 2 (two) times daily as needed for hemorrhoids or anal itching. 12 suppository 1  .  Insulin Glargine (BASAGLAR KWIKPEN) 100 UNIT/ML SOPN INJECT 35 UNITS UNDER SKIN AT THE BEGINNING OF YOUR DAY AND 30 UNITS AT BEDTIME 30 pen 5  . insulin lispro (HUMALOG KWIKPEN) 100 UNIT/ML KiwkPen 15-35 units into the skin 3 times w/ meals 30 mL 2  . levothyroxine (SYNTHROID, LEVOTHROID) 75 MCG tablet TAKE 1 TABLET BY MOUTH ONCE A DAY ON AN EMPTY STOMACH 30 MINUTES BEFORE EATING 30 tablet 11  . losartan (COZAAR) 100 MG tablet Take 1 tablet (100 mg total) by mouth daily. 30 tablet 11  . metFORMIN (GLUCOPHAGE) 1000 MG tablet TAKE 1 TABLET (1,000 MG TOTAL) BY MOUTH 2 (TWO) TIMES DAILY WITH A MEAL. 60 tablet 10  . mometasone (ELOCON) 0.1 % cream Apply 1 application topically daily. To affected areas 30 g 1  . naproxen (NAPROSYN) 500 MG tablet Take 1 tablet (500 mg total) by mouth 2 (two) times daily with a meal. As needed 60 tablet 5  . olmesartan (BENICAR) 40 MG tablet Take 40 mg by mouth daily.  11  . omeprazole (PRILOSEC) 40 MG capsule TAKE 1 CAPSULE (40 MG TOTAL) BY MOUTH DAILY. 30 capsule 11  . TRULICITY 1.5 CL/2.7NT SOPN INJECT 1.5 MG (1 PEN) WEEKLY UNDER SKIN 2 pen 5  . vitamin B-12 (CYANOCOBALAMIN) 1000 MCG tablet Take 1,000 mcg by mouth daily.       No current facility-administered medications on file prior to visit.    Allergies  Allergen Reactions  . Codeine     REACTION: anxious   Family History  Problem Relation Age of Onset  . Diabetes Mother   . Colon cancer Father 26  . Diabetes Other   . Colon polyps Brother   . Diabetes Maternal Aunt   . Cancer Cousin  breast cancer     Objective:   Physical Exam BP 138/88   Pulse 72   Ht 5\' 9"  (1.753 m) Comment: measured  Wt 246 lb (111.6 kg)   SpO2 98%   BMI 36.33 kg/m  Body mass index is 36.33 kg/m.   Wt Readings from Last 3 Encounters:  05/21/18 246 lb (111.6 kg)  04/28/18 246 lb 8 oz (111.8 kg)  03/26/18 248 lb 4 oz (112.6 kg)   Constitutional: overweight, in NAD Eyes: PERRLA, EOMI, no exophthalmos ENT: moist  mucous membranes, no thyromegaly, no cervical lymphadenopathy Cardiovascular: RRR, No MRG Respiratory: CTA B Gastrointestinal: abdomen soft, NT, ND, BS+ Musculoskeletal: no deformities, strength intact in all 4 Skin: moist, warm, no rashes Neurological: no tremor with outstretched hands, DTR normal in all 4  Assessment:     1. 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 HL  3. Hypothyroidism    Plan:     Patient with long-standing, uncontrolled, type 2 diabetes, with persisting poor diet, on complex medication regimen including multiple daily injections, metformin, GLP-1 receptor agonist.  He came off [place] in 04/2017 due to leg tingling.  Retrospectively, in the light of his new neck osteoarthritis, this may have been related to cervical nerve compression. -At this visit, sugars are still high and variable and his diet remains very poor.  Since last visit, he introduce more liquor and also eats more fast foods.  He has almost no fiber foods throughout the day.  We discussed about the importance of eliminating liquor, but also fries and chips and switching from fast food to more nutritious meals.  Until he starts improving his diet, we do not have a lot of chances for success for his diabetes control. -He tells me that he is also missing the second dose of Basaglar at bedtime in the morning if his sugars are lower than 140.  I advised him to start taking this.  I would not change his medication regimen otherwise. - I advised him to: Patient Instructions  Please continue: - Metformin 1000 mg 2x a day with meals - Trulicity 1.5 mg weekly  - Basaglar 35 units 2x a day (take this consistently) - Humalog: 20-25 units before meals  - 15 min before eating  Stop Liquor.  Stop chips and fries.  Please return in 3-4 months with your sugar log.   - today, HbA1c is 8.2% (higher) - continue checking sugars  at different times of the day - check 3x a day, rotating checks - advised for yearly eye exams >> he is UTD - Return to clinic in 3-4 mo with sugar log   2. HL  - Reviewed latest lipid panel from 10/2017: LDL at goal, TG slightly high, HDL low Lab Results  Component Value Date   CHOL 110 10/31/2017   HDL 38.70 (L) 10/31/2017   LDLCALC 36 10/31/2017   LDLDIRECT 48.0 09/21/2016   TRIG 177.0 (H) 10/31/2017   CHOLHDL 3 10/31/2017  - Continues Lipitor without side effects.  3. Hypothyroidism - latest thyroid labs reviewed with pt >> normal, but at the ULN 10/2017 - he continues on LT4 75 mcg daily - pt feels good on this dose. - we discussed about taking the thyroid hormone every day, with water, >30 minutes before breakfast, separated by >4 hours from acid reflux medications, calcium, iron, multivitamins. Pt. is taking it correctly, but was missing doses in the past.  Philemon Kingdom, MD PhD [facility]

## 2018-05-25 ENCOUNTER — Ambulatory Visit
Admission: RE | Admit: 2018-05-25 | Discharge: 2018-05-25 | Disposition: A | Discharge status: Home / Self Care | Payer: 59 | Source: Ambulatory Visit | Attending: Orthopedic Surgery | Admitting: Orthopedic Surgery

## 2018-05-25 DIAGNOSIS — M545 Low back pain, unspecified: Secondary | ICD-10-CM

## 2018-05-25 DIAGNOSIS — M50221 Other cervical disc displacement at C4-C5 level: Secondary | ICD-10-CM | POA: Diagnosis not present

## 2018-05-25 DIAGNOSIS — M542 Cervicalgia: Secondary | ICD-10-CM

## 2018-05-25 DIAGNOSIS — M48061 Spinal stenosis, lumbar region without neurogenic claudication: Secondary | ICD-10-CM | POA: Diagnosis not present

## 2018-05-30 DIAGNOSIS — G959 Disease of spinal cord, unspecified: Secondary | ICD-10-CM | POA: Diagnosis not present

## 2018-05-31 ENCOUNTER — Other Ambulatory Visit: Payer: Self-pay | Admitting: Internal Medicine

## 2018-06-23 ENCOUNTER — Other Ambulatory Visit: Payer: Self-pay | Admitting: Internal Medicine

## 2018-06-27 ENCOUNTER — Other Ambulatory Visit: Payer: Self-pay | Admitting: Internal Medicine

## 2018-07-08 DIAGNOSIS — M9903 Segmental and somatic dysfunction of lumbar region: Secondary | ICD-10-CM | POA: Diagnosis not present

## 2018-07-08 DIAGNOSIS — M4722 Other spondylosis with radiculopathy, cervical region: Secondary | ICD-10-CM | POA: Diagnosis not present

## 2018-07-08 DIAGNOSIS — M9901 Segmental and somatic dysfunction of cervical region: Secondary | ICD-10-CM | POA: Diagnosis not present

## 2018-07-09 DIAGNOSIS — M9901 Segmental and somatic dysfunction of cervical region: Secondary | ICD-10-CM | POA: Diagnosis not present

## 2018-07-09 DIAGNOSIS — M9903 Segmental and somatic dysfunction of lumbar region: Secondary | ICD-10-CM | POA: Diagnosis not present

## 2018-07-09 DIAGNOSIS — M4722 Other spondylosis with radiculopathy, cervical region: Secondary | ICD-10-CM | POA: Diagnosis not present

## 2018-07-10 DIAGNOSIS — M9903 Segmental and somatic dysfunction of lumbar region: Secondary | ICD-10-CM | POA: Diagnosis not present

## 2018-07-10 DIAGNOSIS — M9901 Segmental and somatic dysfunction of cervical region: Secondary | ICD-10-CM | POA: Diagnosis not present

## 2018-07-10 DIAGNOSIS — M4722 Other spondylosis with radiculopathy, cervical region: Secondary | ICD-10-CM | POA: Diagnosis not present

## 2018-07-14 DIAGNOSIS — M9901 Segmental and somatic dysfunction of cervical region: Secondary | ICD-10-CM | POA: Diagnosis not present

## 2018-07-14 DIAGNOSIS — M9903 Segmental and somatic dysfunction of lumbar region: Secondary | ICD-10-CM | POA: Diagnosis not present

## 2018-07-14 DIAGNOSIS — M4722 Other spondylosis with radiculopathy, cervical region: Secondary | ICD-10-CM | POA: Diagnosis not present

## 2018-07-15 DIAGNOSIS — M9903 Segmental and somatic dysfunction of lumbar region: Secondary | ICD-10-CM | POA: Diagnosis not present

## 2018-07-15 DIAGNOSIS — M9901 Segmental and somatic dysfunction of cervical region: Secondary | ICD-10-CM | POA: Diagnosis not present

## 2018-07-15 DIAGNOSIS — M4722 Other spondylosis with radiculopathy, cervical region: Secondary | ICD-10-CM | POA: Diagnosis not present

## 2018-07-16 DIAGNOSIS — M9903 Segmental and somatic dysfunction of lumbar region: Secondary | ICD-10-CM | POA: Diagnosis not present

## 2018-07-16 DIAGNOSIS — M9901 Segmental and somatic dysfunction of cervical region: Secondary | ICD-10-CM | POA: Diagnosis not present

## 2018-07-16 DIAGNOSIS — M4722 Other spondylosis with radiculopathy, cervical region: Secondary | ICD-10-CM | POA: Diagnosis not present

## 2018-07-21 DIAGNOSIS — M9901 Segmental and somatic dysfunction of cervical region: Secondary | ICD-10-CM | POA: Diagnosis not present

## 2018-07-21 DIAGNOSIS — M9903 Segmental and somatic dysfunction of lumbar region: Secondary | ICD-10-CM | POA: Diagnosis not present

## 2018-07-21 DIAGNOSIS — M4722 Other spondylosis with radiculopathy, cervical region: Secondary | ICD-10-CM | POA: Diagnosis not present

## 2018-07-22 DIAGNOSIS — M9903 Segmental and somatic dysfunction of lumbar region: Secondary | ICD-10-CM | POA: Diagnosis not present

## 2018-07-22 DIAGNOSIS — H4311 Vitreous hemorrhage, right eye: Secondary | ICD-10-CM | POA: Diagnosis not present

## 2018-07-22 DIAGNOSIS — M9901 Segmental and somatic dysfunction of cervical region: Secondary | ICD-10-CM | POA: Diagnosis not present

## 2018-07-22 DIAGNOSIS — E113592 Type 2 diabetes mellitus with proliferative diabetic retinopathy without macular edema, left eye: Secondary | ICD-10-CM | POA: Diagnosis not present

## 2018-07-22 DIAGNOSIS — E113591 Type 2 diabetes mellitus with proliferative diabetic retinopathy without macular edema, right eye: Secondary | ICD-10-CM | POA: Diagnosis not present

## 2018-07-22 DIAGNOSIS — M4722 Other spondylosis with radiculopathy, cervical region: Secondary | ICD-10-CM | POA: Diagnosis not present

## 2018-07-23 DIAGNOSIS — M4722 Other spondylosis with radiculopathy, cervical region: Secondary | ICD-10-CM | POA: Diagnosis not present

## 2018-07-23 DIAGNOSIS — M9903 Segmental and somatic dysfunction of lumbar region: Secondary | ICD-10-CM | POA: Diagnosis not present

## 2018-07-23 DIAGNOSIS — M9901 Segmental and somatic dysfunction of cervical region: Secondary | ICD-10-CM | POA: Diagnosis not present

## 2018-07-28 DIAGNOSIS — M4722 Other spondylosis with radiculopathy, cervical region: Secondary | ICD-10-CM | POA: Diagnosis not present

## 2018-07-28 DIAGNOSIS — M9901 Segmental and somatic dysfunction of cervical region: Secondary | ICD-10-CM | POA: Diagnosis not present

## 2018-07-28 DIAGNOSIS — M9903 Segmental and somatic dysfunction of lumbar region: Secondary | ICD-10-CM | POA: Diagnosis not present

## 2018-07-30 DIAGNOSIS — M4722 Other spondylosis with radiculopathy, cervical region: Secondary | ICD-10-CM | POA: Diagnosis not present

## 2018-07-30 DIAGNOSIS — M9901 Segmental and somatic dysfunction of cervical region: Secondary | ICD-10-CM | POA: Diagnosis not present

## 2018-07-30 DIAGNOSIS — M9903 Segmental and somatic dysfunction of lumbar region: Secondary | ICD-10-CM | POA: Diagnosis not present

## 2018-08-01 DIAGNOSIS — H4311 Vitreous hemorrhage, right eye: Secondary | ICD-10-CM | POA: Diagnosis not present

## 2018-08-01 DIAGNOSIS — E113591 Type 2 diabetes mellitus with proliferative diabetic retinopathy without macular edema, right eye: Secondary | ICD-10-CM | POA: Diagnosis not present

## 2018-08-05 ENCOUNTER — Ambulatory Visit: Payer: 59 | Admitting: Family Medicine

## 2018-08-05 ENCOUNTER — Encounter: Payer: Self-pay | Admitting: Family Medicine

## 2018-08-05 VITALS — BP 140/74 | HR 82 | Temp 98.6°F | Ht 69.0 in | Wt 248.2 lb

## 2018-08-05 DIAGNOSIS — R21 Rash and other nonspecific skin eruption: Secondary | ICD-10-CM | POA: Insufficient documentation

## 2018-08-05 MED ORDER — MOMETASONE FUROATE 0.1 % EX CREA: 1.0000 "application " | TOPICAL_CREAM | Freq: Every day | CUTANEOUS | 1 refills | Status: DC

## 2018-08-05 NOTE — Assessment & Plan Note (Signed)
Irritant dermatitis to BP cuff that was used on Friday. Discussed with patient. rec he check with ophtho prior to continued benadryl use. Continue mometasone topical steroid BID for the next week. Update [place] if not improving with treatment. He agrees with plan.

## 2018-08-05 NOTE — Progress Notes (Signed)
BP 140/74 (BP Location: Left Arm, Patient Position: Sitting, Cuff Size: Large)   Pulse 82   Temp 98.6 F (37 C) (Oral)   Ht 5\' 9"  (1.753 m)   Wt 248 lb 4 oz (112.6 kg)   SpO2 97%   BMI 36.66 kg/m    CC: R arm rash Subjective:    Patient ID: Mitchell Palmer, male    DOB: [birth year], 52 y.o.   MRN: [id number]  HPI: Mitchell Palmer is a 52 y.o. male presenting on 08/05/2018 for Rash (C/o rash located on right anterior arm. Noticed 08/03/18. States area itches. Tried mometasone furoate cream and Benadryl. )   3d h/o rash on skin initially itchy treated with benadryl with benefit. He also tried elocon cream he had at home with benefit.  No other rash. No oral lesions.  No new lotions, detergents, soap shampoos. No new medicines or foods.   Had eye surgery last Friday. Site of rash is where BP arm cuff was on during surgery - he felt it was too tight.   No h/o latex allergy.   Relevant past medical, surgical, family and social history reviewed and updated as indicated. Interim medical history since our last visit reviewed. Allergies and medications reviewed and updated. Outpatient Medications Prior to Visit  Medication Sig Dispense Refill  . atorvastatin (LIPITOR) 40 MG tablet Take 1 tablet (40 mg total) by mouth daily. 90 tablet 3  . B-D INS SYR ULTRAFINE 1CC/31G 31G X 5/16" 1 ML MISC INJECT 35 UNITS INTO THE SKIN EVERY 12 HOURS AS DIRECTED BY PHYSICIAN. 200 each 2  . B-D UF III MINI PEN NEEDLES 31G X 5 MM MISC USE 4 TIMES A DAY 100 each 8  . CIALIS 20 MG tablet TAKE 1 TABLET (20 MG TOTAL) BY MOUTH AS DIRECTED. 10 tablet 2  . cyclobenzaprine (FLEXERIL) 10 MG tablet Take 1 tablet (10 mg total) by mouth 3 (three) times daily as needed. 30 tablet 3  . diclofenac (VOLTAREN) 75 MG EC tablet Reported on 02/15/2016    . fluticasone (FLONASE) 50 MCG/ACT nasal spray USE 2 SPRAYS IN EACH NOSTRIL ONCE A DAY 16 g 5  . glucose blood (ONE TOUCH ULTRA TEST) test strip USE TO TEST BLOOD SUGAR 4 TIMES  DAILY AS DIRECTED 100 each 3  . HUMALOG KWIKPEN 100 UNIT/ML KwikPen INJECT 15-25 UNITS INTO THE SKIN 3 TIMES DAILY W/ MEALS, 10-12 UNITS INTO THE SKIN W/ SNACKS. 30 mL 2  . hydrocortisone (ANUSOL-HC) 2.5 % rectal cream Place 1 application rectally 2 (two) times daily as needed for hemorrhoids or anal itching. To affected areas/hemorrhoids 30 g 1  . hydrocortisone (ANUSOL-HC) 25 MG suppository Place 1 suppository (25 mg total) rectally 2 (two) times daily as needed for hemorrhoids or anal itching. 12 suppository 1  . Insulin Glargine (BASAGLAR KWIKPEN) 100 UNIT/ML SOPN INJECT 35 UNITS UNDER SKIN AT THE BEGINNING OF YOUR DAY AND 30 UNITS AT BEDTIME 30 pen 5  . insulin lispro (HUMALOG KWIKPEN) 100 UNIT/ML KiwkPen 15-35 units into the skin 3 times w/ meals 30 mL 2  . levothyroxine (SYNTHROID, LEVOTHROID) 75 MCG tablet TAKE 1 TABLET BY MOUTH ONCE A DAY ON AN EMPTY STOMACH 30 MINUTES BEFORE EATING 30 tablet 11  . losartan (COZAAR) 100 MG tablet Take 1 tablet (100 mg total) by mouth daily. 30 tablet 11  . metFORMIN (GLUCOPHAGE) 1000 MG tablet TAKE 1 TABLET (1,000 MG TOTAL) BY MOUTH 2 (TWO) TIMES DAILY WITH A MEAL. 60 tablet 10  .  naproxen (NAPROSYN) 500 MG tablet Take 1 tablet (500 mg total) by mouth 2 (two) times daily with a meal. As needed 60 tablet 5  . olmesartan (BENICAR) 40 MG tablet Take 40 mg by mouth daily.  11  . omeprazole (PRILOSEC) 40 MG capsule TAKE 1 CAPSULE (40 MG TOTAL) BY MOUTH DAILY. 30 capsule 11  . TRULICITY 1.5 VQ/0.0QQ SOPN INJECT 1.5 MG (1 PEN) WEEKLY UNDER SKIN 2 pen 5  . vitamin B-12 (CYANOCOBALAMIN) 1000 MCG tablet Take 1,000 mcg by mouth daily.      . mometasone (ELOCON) 0.1 % cream Apply 1 application topically daily. To affected areas 30 g 1   No facility-administered medications prior to visit.      Per HPI unless specifically indicated in ROS section below Review of Systems     Objective:    BP 140/74 (BP Location: Left Arm, Patient Position: Sitting, Cuff Size:  Large)   Pulse 82   Temp 98.6 F (37 C) (Oral)   Ht 5\' 9"  (1.753 m)   Wt 248 lb 4 oz (112.6 kg)   SpO2 97%   BMI 36.66 kg/m   Wt Readings from Last 3 Encounters:  08/05/18 248 lb 4 oz (112.6 kg)  05/21/18 246 lb (111.6 kg)  04/28/18 246 lb 8 oz (111.8 kg)    Physical Exam Vitals signs and nursing note reviewed.  Skin:    General: Skin is warm and dry.     Findings: Erythema and rash present.          Comments: Erythematous maculopapular rash to anterior R arm at antecubital region at site of prior blood pressure cuff during surgery on Friday       Assessment & Plan:   Problem List Items Addressed This Visit    Skin rash - Primary    Irritant dermatitis to BP cuff that was used on Friday. Discussed with patient. rec he check with ophtho prior to continued benadryl use. Continue mometasone topical steroid BID for the next week. Update [place] if not improving with treatment. He agrees with plan.           Meds ordered this encounter  Medications  . mometasone (ELOCON) 0.1 % cream    Sig: Apply 1 application topically daily. To affected areas    Dispense:  30 g    Refill:  1   No orders of the defined types were placed in this encounter.   Follow up plan: Return if symptoms worsen or fail to improve.  Ria Bush, MD

## 2018-08-05 NOTE — Patient Instructions (Signed)
Likely contact or irritant dermatitis to BP cuff.  Continue elocon cream - twice daily for the next week.

## 2018-08-06 DIAGNOSIS — G959 Disease of spinal cord, unspecified: Secondary | ICD-10-CM | POA: Diagnosis not present

## 2018-08-18 DIAGNOSIS — H4311 Vitreous hemorrhage, right eye: Secondary | ICD-10-CM | POA: Diagnosis not present

## 2018-08-18 DIAGNOSIS — E113591 Type 2 diabetes mellitus with proliferative diabetic retinopathy without macular edema, right eye: Secondary | ICD-10-CM | POA: Diagnosis not present

## 2018-08-19 DIAGNOSIS — M50122 Cervical disc disorder at C5-C6 level with radiculopathy: Secondary | ICD-10-CM | POA: Diagnosis not present

## 2018-08-19 DIAGNOSIS — M4802 Spinal stenosis, cervical region: Secondary | ICD-10-CM | POA: Diagnosis not present

## 2018-08-19 DIAGNOSIS — M5412 Radiculopathy, cervical region: Secondary | ICD-10-CM | POA: Diagnosis not present

## 2018-09-03 ENCOUNTER — Ambulatory Visit (INDEPENDENT_AMBULATORY_CARE_PROVIDER_SITE_OTHER): Payer: 59 | Admitting: Internal Medicine

## 2018-09-03 ENCOUNTER — Encounter: Payer: Self-pay | Admitting: Internal Medicine

## 2018-09-03 VITALS — BP 120/78 | HR 80 | Ht 69.0 in | Wt 243.0 lb

## 2018-09-03 DIAGNOSIS — IMO0002 Reserved for concepts with insufficient information to code with codable children: Secondary | ICD-10-CM

## 2018-09-03 DIAGNOSIS — Z794 Long term (current) use of insulin: Secondary | ICD-10-CM

## 2018-09-03 DIAGNOSIS — E1165 Type 2 diabetes mellitus with hyperglycemia: Secondary | ICD-10-CM | POA: Diagnosis not present

## 2018-09-03 DIAGNOSIS — E1122 Type 2 diabetes mellitus with diabetic chronic kidney disease: Secondary | ICD-10-CM | POA: Diagnosis not present

## 2018-09-03 DIAGNOSIS — E7800 Pure hypercholesterolemia, unspecified: Secondary | ICD-10-CM

## 2018-09-03 DIAGNOSIS — E039 Hypothyroidism, unspecified: Secondary | ICD-10-CM

## 2018-09-03 DIAGNOSIS — N183 Chronic kidney disease, stage 3 (moderate): Secondary | ICD-10-CM | POA: Diagnosis not present

## 2018-09-03 LAB — HM DIABETES FOOT EXAM

## 2018-09-03 LAB — POCT GLYCOSYLATED HEMOGLOBIN (HGB A1C): HEMOGLOBIN A1C: 7.6 % — AB (ref 4.0–5.6)

## 2018-09-03 MED ORDER — EMPAGLIFLOZIN 10 MG PO TABS: 10.0000 mg | ORAL_TABLET | Freq: Every day | ORAL | 11 refills | Status: DC

## 2018-09-03 NOTE — Patient Instructions (Addendum)
Please continue: - Metformin 1000 mg 2x a day with meals - Trulicity 1.5 mg weekly  - Basaglar 35 units 2x a day  - Humalog: 20-25 units before meals  - 15 min before eating  Please add: - Jardiance 10 mg before b'fast  When you see Dr. Glori Bickers, you will need a kidney and potassium test and a TSH.  Please return in 3-4 months with your sugar log.

## 2018-09-03 NOTE — Progress Notes (Addendum)
Subjective:     Patient ID: Mitchell Palmer, male   DOB: 26-Jul-1966, 53 y.o.   MRN: [id number]  HPI Mitchell Palmer is a 53 y.o. man, returning for followup for DM2, dx in 2000, uncontrolled, insulin-dependent, with complications (CKD stage 3, erectile dysfunction, mild DR). Last visit 3.5 months ago.  In 07/2018 >> had EMG surgery in his R eye for bleeding.  At the end of 07/2018 >> had C spine surgery.  At last visit he was drinking more liquor and eating more fast food, fried foods, pizza.  Sugars were worse.  I strongly advised him to stop these. He started to change his diet since last OV. Sugars are still high, though, especially at night as he is sedentary.  His last hemoglobin A1c was: Lab Results  Component Value Date   HGBA1C 8.2 (A) 05/21/2018   HGBA1C 7.4 (A) 01/27/2018   HGBA1C 7.6 10/21/2017  He had steroid inj's in the past.  He is on: - Metformin 1000 mg 2x a day with meals - Trulicity 1.5 mg weekly  - Basaglar 35 units 2x a day -at last visit he was skipping the second dose if CBG is lower than 140! - Humalog: 20-25-30 units before meals  - 15 min before eating He was on: - he stopped  Invokana 08/2016 as he had leg tingling >> resolved. - Cycloset - started 04/22/2013 >> at 1.6 mg daily (tried 3 tabs >> dizziness >> backed off to 2) - Victoza 1.8 mg daily - Actos 45 mg daily - Januvia - glyburide/metformin - Avandia. - Bydureon 2 mg weekly - he hated the thick needle   He is checking his sugars 2-4 times a day - not gone back to work yet, will start PT tomorrow: - before b'fast: when comes from work: 125, 180-200 >> waking up 160s - 2 h after b'fast:  79-85  -honeybun >> 181-215 >> n/c - before lunch: 200-220s (20 units) >> 73-166 >> n/c - 2 h after lunch:  130-145 >> 171-184 >> 230 - Before dinner:n/c >> 84, 155-190 >> waking up: 125-245 >> 170-180 - nighttime: 168 >> n/c >> 74, 112-199 >> at work 80, 140-180 >> 280-290 Lowest: 74 >> 79 >> 83;  He has  hypoglycemia awareness in the 80s. Highest 232 >> 250 >> 483 (in the hospital).  He works nights: 6 pm - 7:30 am, 3.5 days a week.  Meals: - 5:30-6 pm (at work): subway sandwich >> 2 hot dogs + doritos - snack at 9-10 pm: pistachios or 1/2 sandwich; pizza  >> 12 am: nabs +peanuts - 11 am-12 pm: salad, meat + veggies + rice (leftovers from take out), pizza rarely >> pizza  Trying to eat an apple a day.  -+ HL. Last lipid panel: Lab Results  Component Value Date   CHOL 110 10/31/2017   HDL 38.70 (L) 10/31/2017   LDLCALC 36 10/31/2017   LDLDIRECT 48.0 09/21/2016   TRIG 177.0 (H) 10/31/2017   CHOLHDL 3 10/31/2017  On Lipitor.  -+ Stage II CKD, last BUN/Cr:  Lab Results  Component Value Date   BUN 9 10/31/2017   CREATININE 1.07 10/31/2017   Lab Results  Component Value Date   MICRALBCREAT 0.9 02/15/2016   MICRALBCREAT 1.0 12/07/2014   MICRALBCREAT 0.5 11/04/2012  On olmesartan.  - last eye exam was 07/2018: + DR.  Dr. Katy Fitch >> now Dr. Zadie Rhine.  He had laser surgery OU.  - no numbness or tingling in his legs.  He also has a history of HTN, GERD, esophageal stricture, plantar fasciitis, eustachian tube dysfunction - status post ear surgery.  Hypothyroidism: - On LT4 daily -started 01/2016.   - previously skipping doses >> not anymore  Pt is on levothyroxine 75 mcg daily, taken: - in am - fasting - at least 30 min from b'fast - no Ca, Fe, MVI, PPIs - not on Biotin  Latest TSH was normal: Lab Results  Component Value Date   TSH 4.25 10/31/2017   Review of Systems  Constitutional: no weight gain/no weight loss, no fatigue, no subjective hyperthermia, no subjective hypothermia Eyes: no blurry vision, no xerophthalmia ENT: no sore throat, no nodules palpated in neck, no dysphagia, no odynophagia, no hoarseness Cardiovascular: no CP/no SOB/no palpitations/no leg swelling Respiratory: no cough/no SOB/no wheezing Gastrointestinal: no N/no V/no D/no C/no acid  reflux Musculoskeletal: no muscle aches/+ joint aches Skin: no rashes, no hair loss Neurological: no tremors/no numbness/no tingling/no dizziness  I reviewed pt's medications, allergies, PMH, social hx, family hx, and changes were documented in the history of present illness. Otherwise, unchanged from my initial visit note.  Past Medical History:  Diagnosis Date  . Allergy    allegic rhinitis  . Diabetes mellitus    type II  . Esophageal stricture   . GERD (gastroesophageal reflux disease)   . History of hernia repair    as a baby  . Hyperlipidemia   . Hyperplastic polyp of intestine 2010  . Hypertension   . S/P ear surgery, follow-up exam    for drainage   Past Surgical History:  Procedure Laterality Date  . COLONOSCOPY    . ESOPHAGOGASTRODUODENOSCOPY  04/2009   stricture/GERD  . HERNIA REPAIR     age 63-midline  . [place]   went in behind rt ear-blockage  . POLYPECTOMY    . SHOULDER ARTHROSCOPY WITH ROTATOR CUFF REPAIR AND SUBACROMIAL DECOMPRESSION Right 09/29/2012   Procedure: SHOULDER ARTHROSCOPY WITH ROTATOR CUFF REPAIR AND SUBACROMIAL DECOMPRESSION;  Surgeon: Nita Sells, MD;  Location: [facility];  Service: Orthopedics;  Laterality: Right;  Right shoulder arthroscopy with subacromial decompression and distal clavicle excision & Debridement of Labrial Tear  . UPPER GASTROINTESTINAL ENDOSCOPY     Social History   Socioeconomic History  . Marital status: Single    Spouse name: Not on file  . Number of children: Not on file  . Years of education: Not on file  . Highest education level: Not on file  Occupational History  . Occupation: Mining engineer  Social Needs  . Financial resource strain: Not on file  . Food insecurity:    Worry: Not on file    Inability: Not on file  . Transportation needs:    Medical: Not on file    Non-medical: Not on file  Tobacco Use  . Smoking status: Never Smoker  . Smokeless tobacco: Never Used   Substance and Sexual Activity  . Alcohol use: Yes    Alcohol/week: 12.0 standard drinks    Types: 12 Standard drinks or equivalent per week    Comment: 6 pk every 3 days; 12 pk/week  . Drug use: No  . Sexual activity: Not on file  Lifestyle  . Physical activity:    Days per week: Not on file    Minutes per session: Not on file  . Stress: Not on file  Relationships  . Social connections:    Talks on phone: Not on file    Gets together:  Not on file    Attends religious service: Not on file    Active member of club or organization: Not on file    Attends meetings of clubs or organizations: Not on file    Relationship status: Not on file  . Intimate partner violence:    Fear of current or ex partner: Not on file    Emotionally abused: Not on file    Physically abused: Not on file    Forced sexual activity: Not on file  Other Topics Concern  . Not on file  Social History Narrative  . Not on file   Current Outpatient Medications on File Prior to Visit  Medication Sig Dispense Refill  . atorvastatin (LIPITOR) 40 MG tablet Take 1 tablet (40 mg total) by mouth daily. 90 tablet 3  . B-D INS SYR ULTRAFINE 1CC/31G 31G X 5/16" 1 ML MISC INJECT 35 UNITS INTO THE SKIN EVERY 12 HOURS AS DIRECTED BY PHYSICIAN. 200 each 2  . B-D UF III MINI PEN NEEDLES 31G X 5 MM MISC USE 4 TIMES A DAY 100 each 8  . CIALIS 20 MG tablet TAKE 1 TABLET (20 MG TOTAL) BY MOUTH AS DIRECTED. 10 tablet 2  . cyclobenzaprine (FLEXERIL) 10 MG tablet Take 1 tablet (10 mg total) by mouth 3 (three) times daily as needed. 30 tablet 3  . diclofenac (VOLTAREN) 75 MG EC tablet Reported on 02/15/2016    . fluticasone (FLONASE) 50 MCG/ACT nasal spray USE 2 SPRAYS IN EACH NOSTRIL ONCE A DAY 16 g 5  . glucose blood (ONE TOUCH ULTRA TEST) test strip USE TO TEST BLOOD SUGAR 4 TIMES DAILY AS DIRECTED 100 each 3  . HUMALOG KWIKPEN 100 UNIT/ML KwikPen INJECT 15-25 UNITS INTO THE SKIN 3 TIMES DAILY W/ MEALS, 10-12 UNITS INTO THE SKIN W/  SNACKS. 30 mL 2  . hydrocortisone (ANUSOL-HC) 2.5 % rectal cream Place 1 application rectally 2 (two) times daily as needed for hemorrhoids or anal itching. To affected areas/hemorrhoids 30 g 1  . hydrocortisone (ANUSOL-HC) 25 MG suppository Place 1 suppository (25 mg total) rectally 2 (two) times daily as needed for hemorrhoids or anal itching. 12 suppository 1  . Insulin Glargine (BASAGLAR KWIKPEN) 100 UNIT/ML SOPN INJECT 35 UNITS UNDER SKIN AT THE BEGINNING OF YOUR DAY AND 30 UNITS AT BEDTIME 30 pen 5  . insulin lispro (HUMALOG KWIKPEN) 100 UNIT/ML KiwkPen 15-35 units into the skin 3 times w/ meals 30 mL 2  . levothyroxine (SYNTHROID, LEVOTHROID) 75 MCG tablet TAKE 1 TABLET BY MOUTH ONCE A DAY ON AN EMPTY STOMACH 30 MINUTES BEFORE EATING 30 tablet 11  . losartan (COZAAR) 100 MG tablet Take 1 tablet (100 mg total) by mouth daily. 30 tablet 11  . metFORMIN (GLUCOPHAGE) 1000 MG tablet TAKE 1 TABLET (1,000 MG TOTAL) BY MOUTH 2 (TWO) TIMES DAILY WITH A MEAL. 60 tablet 10  . mometasone (ELOCON) 0.1 % cream Apply 1 application topically daily. To affected areas 30 g 1  . naproxen (NAPROSYN) 500 MG tablet Take 1 tablet (500 mg total) by mouth 2 (two) times daily with a meal. As needed 60 tablet 5  . olmesartan (BENICAR) 40 MG tablet Take 40 mg by mouth daily.  11  . omeprazole (PRILOSEC) 40 MG capsule TAKE 1 CAPSULE (40 MG TOTAL) BY MOUTH DAILY. 30 capsule 11  . TRULICITY 1.5 FU/9.3AT SOPN INJECT 1.5 MG (1 PEN) WEEKLY UNDER SKIN 2 pen 5  . vitamin B-12 (CYANOCOBALAMIN) 1000 MCG tablet Take 1,000 mcg  by mouth daily.       No current facility-administered medications on file prior to visit.    Allergies  Allergen Reactions  . Codeine     REACTION: anxious   Family History  Problem Relation Age of Onset  . Diabetes Mother   . Colon cancer Father 18  . Diabetes Other   . Colon polyps Brother   . Diabetes Maternal Aunt   . Cancer Cousin        breast cancer     Objective:   Physical Exam BP  120/78   Pulse 80   Ht 5\' 9"  (1.753 m) Comment: measured  Wt 243 lb (110.2 kg)   SpO2 98%   BMI 35.88 kg/m  Body mass index is 35.88 kg/m.   Wt Readings from Last 3 Encounters:  09/03/18 243 lb (110.2 kg)  08/05/18 248 lb 4 oz (112.6 kg)  05/21/18 246 lb (111.6 kg)   Constitutional: overweight, in NAD Eyes: PERRLA, EOMI, no exophthalmos ENT: moist mucous membranes, no thyromegaly, no cervical lymphadenopathy Cardiovascular: RRR, No MRG Respiratory: CTA B Gastrointestinal: abdomen soft, NT, ND, BS+ Musculoskeletal: no deformities, strength intact in all 4 Skin: moist, warm, no rashes Neurological: no tremor with outstretched hands, DTR normal in all 4 Foot exam performed today-decreased sensation on dorsum of bilateral feet, indurated skin on both soles  Assessment:     1. 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 HL  3. Hypothyroidism    Plan:     Patient with longstanding, uncontrolled, type 2 diabetes, with persistent poor diet, on a complex medication regimen including basal-bolus insulin regimen, metformin, and weekly GLP-1 receptor agonist.  He came off [place] in 04/2017 due to leg tingling.  Retrospectively, this could have been related to his neck osteoarthritis with cervical neck compression. -At last visit, sugars are higher, as he relaxed his diet and was eating mostly fast foods and drinking alcohol.  We discussed about the importance of eliminating liquor but also the high fat highly processed foods and to introduce more fiber.  Until he improved his diet we do not have a good chance to improve his diabetes.  At that time, he was also missing the second dose of Basaglar and I strongly advised him to not miss it even if his sugars are at goal at bedtime. -At this visit, sugars are still high, especially after dinner.  He did improve his diet since last visit and not drinking as  much alcohol.  However, he is very sedentary after his cervical spine surgery. -We discussed about adding Jardiance-he agrees.  Discussed about benefits and possible side effects.  Given coupon card for this.  We will start at a low dose and we may advance at next visit if no side effects. - I advised him to: Patient Instructions  Please continue: - Metformin 1000 mg 2x a day with meals - Trulicity 1.5 mg weekly  - Basaglar 35 units 2x a day  - Humalog: 20-25 units before meals  - 15 min before eating  Please add: - Jardiance 10 mg before b'fast  When you see Dr. Glori Bickers, you will need a kidney and potassium test and a TSH.  Please return in 3-4 months with your sugar log.   - today, HbA1c is 7.6% (better) - continue checking sugars at different times of the day - check 3x a day, rotating checks - advised for yearly eye  exams >> he is UTD - Return to clinic in 3-4 mo with sugar log   2. HL  - Reviewed latest lipid panel from 10/2017: LDL at goal. TG high, HDL low Lab Results  Component Value Date   CHOL 110 10/31/2017   HDL 38.70 (L) 10/31/2017   LDLCALC 36 10/31/2017   LDLDIRECT 48.0 09/21/2016   TRIG 177.0 (H) 10/31/2017   CHOLHDL 3 10/31/2017  - Continues Lipitor without side effects.  3. Hypothyroidism - latest thyroid labs reviewed with pt >> normal 10/2017 - he continues on LT4 75 mcg daily - pt feels good on this dose. - we discussed about taking the thyroid hormone every day, with water, >30 minutes before breakfast, separated by >4 hours from acid reflux medications, calcium, iron, multivitamins. Pt. is taking it correctly. He was missing med doses in the past. - will check thyroid tests at next visit with PCP per his preference  Component     Latest Ref Rng & Units 09/18/2018  TSH     0.35 - 4.50 uIU/mL 5.18 (H)  Vitamin B12     211 - 911 pg/mL 888   Vitamin B12 is normal, however, TSH is high.  I will advise him to increase her levothyroxine dose to 88 mcg  daily and will recheck his TFTs when he comes back for the next visit.  Philemon Kingdom, MD PhD [facility] Endocr[facility]

## 2018-09-04 DIAGNOSIS — M4322 Fusion of spine, cervical region: Secondary | ICD-10-CM | POA: Diagnosis not present

## 2018-09-08 ENCOUNTER — Other Ambulatory Visit: Payer: Self-pay | Admitting: Internal Medicine

## 2018-09-08 ENCOUNTER — Telehealth: Payer: Self-pay | Admitting: Family Medicine

## 2018-09-08 DIAGNOSIS — Z794 Long term (current) use of insulin: Secondary | ICD-10-CM

## 2018-09-08 DIAGNOSIS — E039 Hypothyroidism, unspecified: Secondary | ICD-10-CM

## 2018-09-08 DIAGNOSIS — E7800 Pure hypercholesterolemia, unspecified: Secondary | ICD-10-CM

## 2018-09-08 DIAGNOSIS — IMO0002 Reserved for concepts with insufficient information to code with codable children: Secondary | ICD-10-CM

## 2018-09-08 DIAGNOSIS — E1165 Type 2 diabetes mellitus with hyperglycemia: Secondary | ICD-10-CM

## 2018-09-08 DIAGNOSIS — E1122 Type 2 diabetes mellitus with diabetic chronic kidney disease: Secondary | ICD-10-CM

## 2018-09-08 DIAGNOSIS — N183 Chronic kidney disease, stage 3 (moderate): Secondary | ICD-10-CM

## 2018-09-08 DIAGNOSIS — I10 Essential (primary) hypertension: Secondary | ICD-10-CM

## 2018-09-08 NOTE — Telephone Encounter (Signed)
Pt was told by his diabetic doctor to do blood work, since he has started the [employer]. Pt is requesting a call from [employer].

## 2018-09-08 NOTE — Telephone Encounter (Signed)
Dr Cruzita Lederer noted that the next time he sees me he needs renal panel (incl K) and TSH  It looks like he has a nurse visit here in Feb? Not sure if she wants it earlier   I will cc her this note and see what she says  Thanks  Please let him know

## 2018-09-08 NOTE — Telephone Encounter (Signed)
Mitchell Palmer, I saw he had a lab appointment for labs for you in February, but I do not see any pending labs.  I will order a TSH and a BMP (to be done then) as these are the ones that I need for his follow-up with me.

## 2018-09-09 NOTE — Addendum Note (Signed)
Addended by: Loura Pardon A on: 09/09/2018 05:01 PM   Modules accepted: Orders

## 2018-09-09 NOTE — Telephone Encounter (Signed)
I advise pt of Dr. Arman Filter comments. Pt is okay with those labs but he would like a "full lab work up" pt said he usually sees Dr. Glori Bickers twice a year and gets his labs done so he wants to do that. OV scheduled with Dr. Glori Bickers on 09/22/18 per pt request and lab appt scheduled for 09/18/18 and he is requesting that Dr. Glori Bickers also pt lab orders in for labs Dr. Cruzita Lederer isn't checking

## 2018-09-09 NOTE — Telephone Encounter (Signed)
I put lab orders in for cmet/cbc/lipid /tsh  Will cc Dr Cruzita Lederer

## 2018-09-10 DIAGNOSIS — M4322 Fusion of spine, cervical region: Secondary | ICD-10-CM | POA: Diagnosis not present

## 2018-09-15 DIAGNOSIS — M4322 Fusion of spine, cervical region: Secondary | ICD-10-CM | POA: Diagnosis not present

## 2018-09-17 ENCOUNTER — Other Ambulatory Visit: Payer: Self-pay | Admitting: Internal Medicine

## 2018-09-17 DIAGNOSIS — M4322 Fusion of spine, cervical region: Secondary | ICD-10-CM | POA: Diagnosis not present

## 2018-09-18 ENCOUNTER — Telehealth: Payer: Self-pay

## 2018-09-18 ENCOUNTER — Other Ambulatory Visit (INDEPENDENT_AMBULATORY_CARE_PROVIDER_SITE_OTHER): Payer: 59

## 2018-09-18 DIAGNOSIS — E039 Hypothyroidism, unspecified: Secondary | ICD-10-CM | POA: Diagnosis not present

## 2018-09-18 DIAGNOSIS — R200 Anesthesia of skin: Secondary | ICD-10-CM

## 2018-09-18 DIAGNOSIS — I10 Essential (primary) hypertension: Secondary | ICD-10-CM | POA: Diagnosis not present

## 2018-09-18 DIAGNOSIS — E7800 Pure hypercholesterolemia, unspecified: Secondary | ICD-10-CM

## 2018-09-18 LAB — COMPREHENSIVE METABOLIC PANEL
ALT: 30 U/L (ref 0–53)
AST: 21 U/L (ref 0–37)
Albumin: 4.2 g/dL (ref 3.5–5.2)
Alkaline Phosphatase: 85 U/L (ref 39–117)
BUN: 14 mg/dL (ref 6–23)
CO2: 30 mEq/L (ref 19–32)
Calcium: 9.5 mg/dL (ref 8.4–10.5)
Chloride: 99 mEq/L (ref 96–112)
Creatinine, Ser: 1.31 mg/dL (ref 0.40–1.50)
GFR: 57.57 mL/min — ABNORMAL LOW (ref >60.00)
Glucose, Bld: 198 mg/dL — ABNORMAL HIGH (ref 70–99)
Potassium: 4.2 mEq/L (ref 3.5–5.1)
Sodium: 138 mEq/L (ref 135–145)
Total Bilirubin: 0.5 mg/dL (ref 0.2–1.2)
Total Protein: 6.7 g/dL (ref 6.0–8.3)

## 2018-09-18 LAB — CBC WITH DIFFERENTIAL/PLATELET
Basophils Absolute: 0.1 10*3/uL (ref 0.0–0.1)
Basophils Relative: 1.3 % (ref 0.0–3.0)
EOS PCT: 4 % (ref 0.0–5.0)
Eosinophils Absolute: 0.3 10*3/uL (ref 0.0–0.7)
HCT: 46.8 % (ref 39.0–52.0)
Hemoglobin: 16.5 g/dL (ref 13.0–17.0)
Lymphocytes Relative: 26 % (ref 12.0–46.0)
Lymphs Abs: 1.9 10*3/uL (ref 0.7–4.0)
MCHC: 35.2 g/dL (ref 30.0–36.0)
MCV: 97.2 fl (ref 78.0–100.0)
MONO ABS: 0.8 10*3/uL (ref 0.1–1.0)
Monocytes Relative: 11.3 % (ref 3.0–12.0)
Neutro Abs: 4.1 10*3/uL (ref 1.4–7.7)
Neutrophils Relative %: 57.4 % (ref 43.0–77.0)
Platelets: 205 10*3/uL (ref 150.0–400.0)
RBC: 4.82 Mil/uL (ref 4.22–5.81)
RDW: 12.8 % (ref 11.5–15.5)
WBC: 7.1 10*3/uL (ref 4.0–10.5)

## 2018-09-18 LAB — TSH: TSH: 5.18 u[IU]/mL — ABNORMAL HIGH (ref 0.35–4.50)

## 2018-09-18 LAB — LIPID PANEL
Cholesterol: 117 mg/dL (ref 0–200)
HDL: 35.4 mg/dL — ABNORMAL LOW (ref >39.00)
LDL Cholesterol: 51 mg/dL (ref 0–99)
NonHDL: 82.01
Total CHOL/HDL Ratio: 3
Triglycerides: 153 mg/dL — ABNORMAL HIGH (ref 0.0–149.0)
VLDL: 30.6 mg/dL (ref 0.0–40.0)

## 2018-09-18 LAB — VITAMIN B12: Vitamin B-12: 888 pg/mL (ref 211–911)

## 2018-09-18 MED ORDER — LEVOTHYROXINE SODIUM 88 MCG PO TABS: ORAL_TABLET | ORAL | 3 refills | Status: DC

## 2018-09-18 NOTE — Addendum Note (Signed)
Addended by: Philemon Kingdom on: 09/18/2018 01:31 PM   Modules accepted: Orders

## 2018-09-18 NOTE — Telephone Encounter (Signed)
Spoke to patient and gave message, he expressed understanding but wants to wait and speak to his PCP to see if she agrees to the dose change.

## 2018-09-18 NOTE — Telephone Encounter (Signed)
-----   Message from Philemon Kingdom, MD sent at 09/18/2018  1:32 PM EST ----- Lenna Sciara, can you please call pt: B12 level is great, however, TSH is slightly high.  Please advise him to increase the dose of levothyroxine to 88 mcg daily.  I sent this dose to his pharmacy.  We will repeat his labs when he comes back for the next visit.

## 2018-09-19 ENCOUNTER — Other Ambulatory Visit: Payer: Self-pay

## 2018-09-19 MED ORDER — FLUTICASONE PROPIONATE 50 MCG/ACT NA SUSP: 2.0000 | Freq: Every day | NASAL | 5 refills | Status: DC

## 2018-09-22 ENCOUNTER — Ambulatory Visit: Payer: 59 | Admitting: Family Medicine

## 2018-09-22 ENCOUNTER — Encounter: Payer: Self-pay | Admitting: Family Medicine

## 2018-09-22 VITALS — BP 128/78 | HR 87 | Temp 98.4°F | Ht 69.0 in | Wt 246.0 lb

## 2018-09-22 DIAGNOSIS — N183 Chronic kidney disease, stage 3 (moderate): Secondary | ICD-10-CM

## 2018-09-22 DIAGNOSIS — E039 Hypothyroidism, unspecified: Secondary | ICD-10-CM | POA: Diagnosis not present

## 2018-09-22 DIAGNOSIS — I10 Essential (primary) hypertension: Secondary | ICD-10-CM | POA: Diagnosis not present

## 2018-09-22 DIAGNOSIS — E7800 Pure hypercholesterolemia, unspecified: Secondary | ICD-10-CM | POA: Diagnosis not present

## 2018-09-22 DIAGNOSIS — Z789 Other specified health status: Secondary | ICD-10-CM

## 2018-09-22 DIAGNOSIS — Z6836 Body mass index (BMI) 36.0-36.9, adult: Secondary | ICD-10-CM

## 2018-09-22 DIAGNOSIS — Z794 Long term (current) use of insulin: Secondary | ICD-10-CM

## 2018-09-22 DIAGNOSIS — E1122 Type 2 diabetes mellitus with diabetic chronic kidney disease: Secondary | ICD-10-CM | POA: Diagnosis not present

## 2018-09-22 DIAGNOSIS — M542 Cervicalgia: Secondary | ICD-10-CM

## 2018-09-22 DIAGNOSIS — IMO0002 Reserved for concepts with insufficient information to code with codable children: Secondary | ICD-10-CM

## 2018-09-22 DIAGNOSIS — E1165 Type 2 diabetes mellitus with hyperglycemia: Secondary | ICD-10-CM

## 2018-09-22 NOTE — Patient Instructions (Addendum)
Continue your current dose of thyroid medications   Take care of yourself  Get back to regular activity (and eventually exercise) when you are able   Try to stick to a diabetic diet   Try not to miss thyroid medicine doses We will re check that in a month

## 2018-09-22 NOTE — Assessment & Plan Note (Signed)
improved

## 2018-09-22 NOTE — Progress Notes (Signed)
Subjective:    Patient ID: Mitchell Palmer, male    DOB: 05/04/1966, 53 y.o.   MRN: [id number]  HPI Here for f/u of chronic medical problems   Wt Readings from Last 3 Encounters:  09/22/18 246 lb (111.6 kg)  09/03/18 243 lb (110.2 kg)  08/05/18 248 lb 4 oz (112.6 kg)   36.33 kg/m   bp is stable today  No cp or palpitations or headaches or edema  No side effects to medicines  BP Readings from Last 3 Encounters:  09/22/18 128/78  09/03/18 120/78  08/05/18 140/74     Hypothyroidism  Pt has no clinical changes (had neck surgery 32 days ago)  Had been off thyroid medicine until 2 weeks  No change in energy level/ hair or skin/ edema and no tremor Lab Results  Component Value Date   TSH 5.18 (H) 09/18/2018    Endocrinology recommended inc in dose- he wanted to hold off given his recent missed doses  He does take on an empty stomach     DM sees endocrinology Lab Results  Component Value Date   HGBA1C 7.6 (A) 09/03/2018    Lipids Lab Results  Component Value Date   CHOL 117 09/18/2018   CHOL 110 10/31/2017   CHOL 124 09/21/2016   Lab Results  Component Value Date   HDL 35.40 (L) 09/18/2018   HDL 38.70 (L) 10/31/2017   HDL 47.60 09/21/2016   Lab Results  Component Value Date   LDLCALC 51 09/18/2018   LDLCALC 36 10/31/2017   LDLCALC 62 09/30/2013   Lab Results  Component Value Date   TRIG 153.0 (H) 09/18/2018   TRIG 177.0 (H) 10/31/2017   TRIG 211.0 (H) 09/21/2016   Lab Results  Component Value Date   CHOLHDL 3 09/18/2018   CHOLHDL 3 10/31/2017   CHOLHDL 3 09/21/2016   Lab Results  Component Value Date   LDLDIRECT 48.0 09/21/2016   LDLDIRECT 59.0 01/23/2016   LDLDIRECT 72.0 07/22/2015   Less active- he had eye surgery and then neck surgery (less active)   Lab Results  Component Value Date   CREATININE 1.31 09/18/2018   BUN 14 09/18/2018   NA 138 09/18/2018   K 4.2 09/18/2018   CL 99 09/18/2018   CO2 30 09/18/2018   Lab Results    Component Value Date   ALT 30 09/18/2018   AST 21 09/18/2018   ALKPHOS 85 09/18/2018   BILITOT 0.5 09/18/2018   Lab Results  Component Value Date   WBC 7.1 09/18/2018   HGB 16.5 09/18/2018   HCT 46.8 09/18/2018   MCV 97.2 09/18/2018   PLT 205.0 09/18/2018    Depression screen - score 2 (then 5)- he says mostly sleeping and fatigue problems after his surgery  He does not feel depressed   Patient Active Problem List   Diagnosis Date Noted  . Skin rash 08/05/2018  . Neck pain 04/28/2018  . Need for hepatitis B screening test 03/26/2018  . Need for hepatitis C screening test 03/26/2018  . Paresthesia of both feet 11/01/2017  . Hemorrhoids 10/14/2017  . Obesity 01/24/2016  . Hypothyroidism 01/24/2016  . Uncontrolled type 2 diabetes mellitus with stage 3 chronic kidney disease, with long-term current use of insulin ([place]) 07/28/2015  . Alcohol consumption heavy 12/06/2014  . Necrobiosis diabeticorum ([place]) 03/01/2014  . Low back pain 06/17/2012  . Abnormal chest x-ray 10/15/2011  . PLANTAR FASCIITIS, TRAUMATIC 11/09/2009  . NEOPLASM UNCERTAIN BEHAVIOR OTHER SPEC SITES 10/07/2009  .  ESOPHAGEAL STRICTURE 06/27/2009  . GERD 03/31/2009  . OTHER DYSPHAGIA 03/31/2009  . Hyperlipidemia 01/06/2009  . Essential hypertension 01/06/2009  . ALLERGIC RHINITIS 01/06/2009   Past Medical History:  Diagnosis Date  . Allergy    allegic rhinitis  . Diabetes mellitus    type II  . Esophageal stricture   . GERD (gastroesophageal reflux disease)   . History of hernia repair    as a baby  . Hyperlipidemia   . Hyperplastic polyp of intestine 2010  . Hypertension   . S/P ear surgery, follow-up exam    for drainage   Past Surgical History:  Procedure Laterality Date  . COLONOSCOPY    . ESOPHAGOGASTRODUODENOSCOPY  04/2009   stricture/GERD  . HERNIA REPAIR     age 15-midline  . [place]   went in behind rt ear-blockage  . POLYPECTOMY    . SHOULDER ARTHROSCOPY WITH  ROTATOR CUFF REPAIR AND SUBACROMIAL DECOMPRESSION Right 09/29/2012   Procedure: SHOULDER ARTHROSCOPY WITH ROTATOR CUFF REPAIR AND SUBACROMIAL DECOMPRESSION;  Surgeon: Nita Sells, MD;  Location: [facility];  Service: Orthopedics;  Laterality: Right;  Right shoulder arthroscopy with subacromial decompression and distal clavicle excision & Debridement of Labrial Tear  . UPPER GASTROINTESTINAL ENDOSCOPY     Social History   Tobacco Use  . Smoking status: Never Smoker  . Smokeless tobacco: Never Used  Substance Use Topics  . Alcohol use: Yes    Alcohol/week: 12.0 standard drinks    Types: 12 Standard drinks or equivalent per week    Comment: 6 pk every 3 days; 12 pk/week  . Drug use: No   Family History  Problem Relation Age of Onset  . Diabetes Mother   . Colon cancer Father 39  . Diabetes Other   . Colon polyps Brother   . Diabetes Maternal Aunt   . Cancer Cousin        breast cancer   Allergies  Allergen Reactions  . Codeine     REACTION: anxious   Current Outpatient Medications on File Prior to Visit  Medication Sig Dispense Refill  . atorvastatin (LIPITOR) 40 MG tablet Take 1 tablet (40 mg total) by mouth daily. 90 tablet 3  . B-D INS SYR ULTRAFINE 1CC/31G 31G X 5/16" 1 ML MISC INJECT 35 UNITS INTO THE SKIN EVERY 12 HOURS AS DIRECTED BY PHYSICIAN. 200 each 2  . B-D UF III MINI PEN NEEDLES 31G X 5 MM MISC USE 4 TIMES A DAY 100 each 8  . CIALIS 20 MG tablet TAKE 1 TABLET (20 MG TOTAL) BY MOUTH AS DIRECTED. 10 tablet 2  . cyclobenzaprine (FLEXERIL) 10 MG tablet Take 1 tablet (10 mg total) by mouth 3 (three) times daily as needed. 30 tablet 3  . diclofenac (VOLTAREN) 75 MG EC tablet Reported on 02/15/2016    . empagliflozin (JARDIANCE) 10 MG TABS tablet Take 10 mg by mouth daily. 30 tablet 11  . fluticasone (FLONASE) 50 MCG/ACT nasal spray Place 2 sprays into both nostrils daily. 16 g 5  . glucose blood test strip USE TO TEST BLOOD SUGAR 4 TIMES DAILY AS  DIRECTED 100 each 3  . HUMALOG KWIKPEN 100 UNIT/ML KwikPen INJECT 15-25 UNITS INTO THE SKIN 3 TIMES DAILY W/ MEALS, 10-12 UNITS INTO THE SKIN W/ SNACKS. 30 mL 2  . hydrocortisone (ANUSOL-HC) 25 MG suppository Place 1 suppository (25 mg total) rectally 2 (two) times daily as needed for hemorrhoids or anal itching. 12 suppository 1  .  Insulin Glargine (BASAGLAR KWIKPEN) 100 UNIT/ML SOPN INJECT 35 UNITS UNDER SKIN AT THE BEGINNING OF YOUR DAY AND 30 UNITS AT BEDTIME 30 pen 5  . levothyroxine (SYNTHROID, LEVOTHROID) 88 MCG tablet TAKE 1 TABLET BY MOUTH ONCE A DAY ON AN EMPTY STOMACH 30 MINUTES BEFORE EATING 90 tablet 3  . metFORMIN (GLUCOPHAGE) 1000 MG tablet TAKE 1 TABLET (1,000 MG TOTAL) BY MOUTH 2 (TWO) TIMES DAILY WITH A MEAL. 60 tablet 10  . mometasone (ELOCON) 0.1 % cream Apply 1 application topically daily. To affected areas 30 g 1  . olmesartan (BENICAR) 40 MG tablet Take 40 mg by mouth daily.  11  . omeprazole (PRILOSEC) 40 MG capsule TAKE 1 CAPSULE (40 MG TOTAL) BY MOUTH DAILY. 30 capsule 11  . TRULICITY 1.5 JJ/0.0XF SOPN INJECT 1.5 MG (1 PEN) WEEKLY UNDER SKIN 2 pen 5  . vitamin B-12 (CYANOCOBALAMIN) 1000 MCG tablet Take 1,000 mcg by mouth daily.       No current facility-administered medications on file prior to visit.      Review of Systems  Constitutional: Positive for fatigue. Negative for activity change, appetite change, fever and unexpected weight change.  HENT: Negative for congestion, rhinorrhea, sore throat and trouble swallowing.   Eyes: Negative for pain, redness, itching and visual disturbance.  Respiratory: Negative for cough, chest tightness, shortness of breath and wheezing.   Cardiovascular: Negative for chest pain and palpitations.  Gastrointestinal: Negative for abdominal pain, blood in stool, constipation, diarrhea and nausea.  Endocrine: Negative for cold intolerance, heat intolerance, polydipsia and polyuria.  Genitourinary: Negative for difficulty urinating,  dysuria, frequency and urgency.  Musculoskeletal: Positive for neck pain. Negative for arthralgias, joint swelling and myalgias.       Neck pain improving s/p surgery   Skin: Negative for pallor and rash.  Neurological: Negative for dizziness, tremors, weakness, numbness and headaches.  Hematological: Negative for adenopathy. Does not bruise/bleed easily.  Psychiatric/Behavioral: Positive for sleep disturbance. Negative for decreased concentration and dysphoric mood. The patient is not nervous/anxious.        Some sleep issues since surgery- discomfort        Objective:   Physical Exam Constitutional:      General: He is not in acute distress.    Appearance: He is well-developed. He is obese. He is not diaphoretic.  HENT:     Head: Normocephalic and atraumatic.     Mouth/Throat:     Mouth: Mucous membranes are moist.     Pharynx: Oropharynx is clear.  Eyes:     Conjunctiva/sclera: Conjunctivae normal.     Pupils: Pupils are equal, round, and reactive to light.  Neck:     Musculoskeletal: Normal range of motion and neck supple.     Thyroid: No thyromegaly.     Vascular: No carotid bruit or JVD.     Comments: Well healing ant neck incision  Cardiovascular:     Rate and Rhythm: Normal rate and regular rhythm.     Heart sounds: Normal heart sounds. No murmur. No gallop.   Pulmonary:     Effort: Pulmonary effort is normal. No respiratory distress.     Breath sounds: Normal breath sounds. No wheezing or rales.     Comments: Good air exch Abdominal:     General: Bowel sounds are normal. There is no distension or abdominal bruit.     Palpations: Abdomen is soft. There is no mass.     Tenderness: There is no abdominal tenderness.  Musculoskeletal:  Right lower leg: No edema.     Left lower leg: No edema.  Lymphadenopathy:     Cervical: No cervical adenopathy.  Skin:    General: Skin is warm and dry.     Findings: No rash.  Neurological:     Mental Status: He is alert.  Mental status is at baseline.     Deep Tendon Reflexes: Reflexes are normal and symmetric.  Psychiatric:        Mood and Affect: Mood normal.           Assessment & Plan:   Problem List Items Addressed This Visit      Cardiovascular and Mediastinum   Essential hypertension    bp in fair control at this time  BP Readings from Last 1 Encounters:  09/22/18 128/78   No changes needed Most recent labs reviewed  Disc lifstyle change with low sodium diet and exercise          Endocrine   Uncontrolled type 2 diabetes mellitus with stage 3 chronic kidney disease, with long-term current use of insulin ([facility])    Sees Dr Cruzita Lederer Lab Results  Component Value Date   HGBA1C 7.6 (A) 09/03/2018   Enc him to stick to DM diet He is drinking less etoh-which is helpful      Hypothyroidism - Primary    Hypothyroidism  Pt has no clinical changes No change in energy level/ hair or skin/ edema and no tremor (with exception of fatigue from surgery) Lab Results  Component Value Date   TSH 5.18 (H) 09/18/2018    In light of missed doses will not change now -re check TSH in a month         Other   Hyperlipidemia    Disc goals for lipids and reasons to control them Rev last labs with pt Rev low sat fat diet in detail Good LDL control with atorvastatin and diet  Trig are stable HDL low- hopes to improve once he can be more active       Alcohol consumption heavy    improved      Obesity    Discussed how this problem influences overall health and the risks it imposes  Reviewed plan for weight loss with lower calorie diet (via better food choices and also portion control or program like weight watchers) and exercise building up to or more than 30 minutes 5 days per week including some aerobic activity         Neck pain

## 2018-09-22 NOTE — Assessment & Plan Note (Signed)
Sees Dr Cruzita Lederer Lab Results  Component Value Date   HGBA1C 7.6 (A) 09/03/2018   Enc him to stick to DM diet He is drinking less etoh-which is helpful

## 2018-09-22 NOTE — Assessment & Plan Note (Signed)
Hypothyroidism  Pt has no clinical changes No change in energy level/ hair or skin/ edema and no tremor (with exception of fatigue from surgery) Lab Results  Component Value Date   TSH 5.18 (H) 09/18/2018    In light of missed doses will not change now -re check TSH in a month

## 2018-09-22 NOTE — Assessment & Plan Note (Signed)
Disc goals for lipids and reasons to control them Rev last labs with pt Rev low sat fat diet in detail Good LDL control with atorvastatin and diet  Trig are stable HDL low- hopes to improve once he can be more active

## 2018-09-22 NOTE — Assessment & Plan Note (Signed)
bp in fair control at this time  BP Readings from Last 1 Encounters:  09/22/18 128/78   No changes needed Most recent labs reviewed  Disc lifstyle change with low sodium diet and exercise

## 2018-09-22 NOTE — Assessment & Plan Note (Signed)
Discussed how this problem influences overall health and the risks it imposes  Reviewed plan for weight loss with lower calorie diet (via better food choices and also portion control or program like weight watchers) and exercise building up to or more than 30 minutes 5 days per week including some aerobic activity    

## 2018-09-23 DIAGNOSIS — M4322 Fusion of spine, cervical region: Secondary | ICD-10-CM | POA: Diagnosis not present

## 2018-09-25 DIAGNOSIS — M4322 Fusion of spine, cervical region: Secondary | ICD-10-CM | POA: Diagnosis not present

## 2018-09-30 ENCOUNTER — Ambulatory Visit (INDEPENDENT_AMBULATORY_CARE_PROVIDER_SITE_OTHER): Payer: 59 | Admitting: *Deleted

## 2018-09-30 DIAGNOSIS — Z23 Encounter for immunization: Secondary | ICD-10-CM

## 2018-09-30 DIAGNOSIS — M4322 Fusion of spine, cervical region: Secondary | ICD-10-CM | POA: Diagnosis not present

## 2018-10-02 DIAGNOSIS — M4322 Fusion of spine, cervical region: Secondary | ICD-10-CM | POA: Diagnosis not present

## 2018-10-02 DIAGNOSIS — E113591 Type 2 diabetes mellitus with proliferative diabetic retinopathy without macular edema, right eye: Secondary | ICD-10-CM | POA: Diagnosis not present

## 2018-10-03 ENCOUNTER — Other Ambulatory Visit: Payer: Self-pay | Admitting: Family Medicine

## 2018-10-03 DIAGNOSIS — Z9889 Other specified postprocedural states: Secondary | ICD-10-CM | POA: Diagnosis not present

## 2018-10-03 DIAGNOSIS — M5412 Radiculopathy, cervical region: Secondary | ICD-10-CM | POA: Diagnosis not present

## 2018-10-16 ENCOUNTER — Ambulatory Visit: Payer: 59 | Admitting: Family Medicine

## 2018-10-16 ENCOUNTER — Encounter: Payer: Self-pay | Admitting: Family Medicine

## 2018-10-16 VITALS — BP 124/70 | HR 80 | Temp 98.4°F | Ht 69.0 in | Wt 238.2 lb

## 2018-10-16 DIAGNOSIS — H6980 Other specified disorders of Eustachian tube, unspecified ear: Secondary | ICD-10-CM | POA: Insufficient documentation

## 2018-10-16 DIAGNOSIS — M4322 Fusion of spine, cervical region: Secondary | ICD-10-CM | POA: Diagnosis not present

## 2018-10-16 DIAGNOSIS — H6501 Acute serous otitis media, right ear: Secondary | ICD-10-CM

## 2018-10-16 MED ORDER — AMOXICILLIN-POT CLAVULANATE 875-125 MG PO TABS: 1.0000 | ORAL_TABLET | Freq: Two times a day (BID) | ORAL | 0 refills | Status: DC

## 2018-10-16 NOTE — Assessment & Plan Note (Signed)
R ear with effusion (effusion on L w/o evidence of infection) Cover with augmentin  Enc fluid intake  Continue steroid nasal spray bid to help ETD Update if not starting to improve in a week or if worsening   Pt has had tubes and ear surgery in the past-low threshold for ENT eval if needed

## 2018-10-16 NOTE — Patient Instructions (Signed)
Continue the steroid nasal spray  Take augmentin for ear infection   Make sure you keep up with fluids  For runny nose/drip an antihistamine is ok (claritin or allegra or zyrtec)   Update if not starting to improve in a week or if worsening    Stop the ear drops for now

## 2018-10-16 NOTE — Progress Notes (Signed)
Subjective:    Patient ID: Mitchell Palmer, male    DOB: 1965/10/26, 53 y.o.   MRN: [id number]  HPI  Here for c/o of ear pain (both sides)   Started 2/17  Was at the beach and woke up with R ear hurting (had not been swimming)  Came home 3 d later and other ear started hurting   Wonders if allergies flared at the beach  Some nasal congestion  Has felt a bit off balance/ dizzy (esp when getting up) Hearing is muffled (more on R than L)  No facial pain  Nasal d/c is clear   Some phlegm in am and a little cough  Sinuses are draining   At work one day he felt nauseated  Burped a few times/ did not vomit   Otc: tylenol cold and sinus  Also nasal steroid spray twice daily -helped a little bit   Patient Active Problem List   Diagnosis Date Noted  . Otitis media 10/16/2018  . Skin rash 08/05/2018  . Neck pain 04/28/2018  . Need for hepatitis B screening test 03/26/2018  . Need for hepatitis C screening test 03/26/2018  . Paresthesia of both feet 11/01/2017  . Hemorrhoids 10/14/2017  . Obesity 01/24/2016  . Hypothyroidism 01/24/2016  . Uncontrolled type 2 diabetes mellitus with stage 3 chronic kidney disease, with long-term current use of insulin ([place]) 07/28/2015  . Alcohol consumption heavy 12/06/2014  . Necrobiosis diabeticorum ([place]) 03/01/2014  . Low back pain 06/17/2012  . Abnormal chest x-ray 10/15/2011  . PLANTAR FASCIITIS, TRAUMATIC 11/09/2009  . NEOPLASM UNCERTAIN BEHAVIOR OTHER SPEC SITES 10/07/2009  . ESOPHAGEAL STRICTURE 06/27/2009  . GERD 03/31/2009  . OTHER DYSPHAGIA 03/31/2009  . Hyperlipidemia 01/06/2009  . Essential hypertension 01/06/2009  . ALLERGIC RHINITIS 01/06/2009   Past Medical History:  Diagnosis Date  . Allergy    allegic rhinitis  . Diabetes mellitus    type II  . Esophageal stricture   . GERD (gastroesophageal reflux disease)   . History of hernia repair    as a baby  . Hyperlipidemia   . Hyperplastic polyp of intestine 2010  .  Hypertension   . S/P ear surgery, follow-up exam    for drainage   Past Surgical History:  Procedure Laterality Date  . COLONOSCOPY    . ESOPHAGOGASTRODUODENOSCOPY  04/2009   stricture/GERD  . HERNIA REPAIR     age 35-midline  . [place]   went in behind rt ear-blockage  . POLYPECTOMY    . SHOULDER ARTHROSCOPY WITH ROTATOR CUFF REPAIR AND SUBACROMIAL DECOMPRESSION Right 09/29/2012   Procedure: SHOULDER ARTHROSCOPY WITH ROTATOR CUFF REPAIR AND SUBACROMIAL DECOMPRESSION;  Surgeon: Nita Sells, MD;  Location: [facility];  Service: Orthopedics;  Laterality: Right;  Right shoulder arthroscopy with subacromial decompression and distal clavicle excision & Debridement of Labrial Tear  . UPPER GASTROINTESTINAL ENDOSCOPY     Social History   Tobacco Use  . Smoking status: Never Smoker  . Smokeless tobacco: Never Used  Substance Use Topics  . Alcohol use: Yes    Alcohol/week: 12.0 standard drinks    Types: 12 Standard drinks or equivalent per week    Comment: 6 pk every 3 days; 12 pk/week  . Drug use: No   Family History  Problem Relation Age of Onset  . Diabetes Mother   . Colon cancer Father 66  . Diabetes Other   . Colon polyps Brother   . Diabetes Maternal Aunt   .  Cancer Cousin        breast cancer   Allergies  Allergen Reactions  . Codeine     REACTION: anxious   Current Outpatient Medications on File Prior to Visit  Medication Sig Dispense Refill  . atorvastatin (LIPITOR) 40 MG tablet Take 1 tablet (40 mg total) by mouth daily. 90 tablet 3  . B-D INS SYR ULTRAFINE 1CC/31G 31G X 5/16" 1 ML MISC INJECT 35 UNITS INTO THE SKIN EVERY 12 HOURS AS DIRECTED BY PHYSICIAN. 200 each 2  . B-D UF III MINI PEN NEEDLES 31G X 5 MM MISC USE 4 TIMES A DAY 100 each 8  . CIALIS 20 MG tablet TAKE 1 TABLET (20 MG TOTAL) BY MOUTH AS DIRECTED. 4 tablet 5  . cyclobenzaprine (FLEXERIL) 10 MG tablet Take 1 tablet (10 mg total) by mouth 3 (three) times daily  as needed. 30 tablet 3  . diclofenac (VOLTAREN) 75 MG EC tablet Reported on 02/15/2016    . empagliflozin (JARDIANCE) 10 MG TABS tablet Take 10 mg by mouth daily. 30 tablet 11  . fluticasone (FLONASE) 50 MCG/ACT nasal spray Place 2 sprays into both nostrils daily. 16 g 5  . glucose blood test strip USE TO TEST BLOOD SUGAR 4 TIMES DAILY AS DIRECTED 100 each 3  . HUMALOG KWIKPEN 100 UNIT/ML KwikPen INJECT 15-25 UNITS INTO THE SKIN 3 TIMES DAILY W/ MEALS, 10-12 UNITS INTO THE SKIN W/ SNACKS. 30 mL 2  . hydrocortisone (ANUSOL-HC) 25 MG suppository Place 1 suppository (25 mg total) rectally 2 (two) times daily as needed for hemorrhoids or anal itching. 12 suppository 1  . Insulin Glargine (BASAGLAR KWIKPEN) 100 UNIT/ML SOPN INJECT 35 UNITS UNDER SKIN AT THE BEGINNING OF YOUR DAY AND 30 UNITS AT BEDTIME 30 pen 5  . levothyroxine (SYNTHROID, LEVOTHROID) 88 MCG tablet TAKE 1 TABLET BY MOUTH ONCE A DAY ON AN EMPTY STOMACH 30 MINUTES BEFORE EATING 90 tablet 3  . metFORMIN (GLUCOPHAGE) 1000 MG tablet TAKE 1 TABLET (1,000 MG TOTAL) BY MOUTH 2 (TWO) TIMES DAILY WITH A MEAL. 60 tablet 10  . mometasone (ELOCON) 0.1 % cream Apply 1 application topically daily. To affected areas 30 g 1  . olmesartan (BENICAR) 40 MG tablet Take 40 mg by mouth daily.  11  . omeprazole (PRILOSEC) 40 MG capsule TAKE 1 CAPSULE (40 MG TOTAL) BY MOUTH DAILY. 30 capsule 11  . TRULICITY 1.5 KV/4.2VZ SOPN INJECT 1.5 MG (1 PEN) WEEKLY UNDER SKIN 2 pen 5  . vitamin B-12 (CYANOCOBALAMIN) 1000 MCG tablet Take 1,000 mcg by mouth daily.       No current facility-administered medications on file prior to visit.      Review of Systems  Constitutional: Negative for activity change, appetite change, fatigue, fever and unexpected weight change.  HENT: Positive for congestion, ear pain, hearing loss and postnasal drip. Negative for ear discharge, mouth sores, rhinorrhea, sore throat and trouble swallowing.   Eyes: Negative for pain, redness, itching  and visual disturbance.  Respiratory: Negative for cough, chest tightness, shortness of breath and wheezing.   Cardiovascular: Negative for chest pain and palpitations.  Gastrointestinal: Negative for abdominal pain, blood in stool, constipation, diarrhea and nausea.  Endocrine: Negative for cold intolerance, heat intolerance, polydipsia and polyuria.  Genitourinary: Negative for difficulty urinating, dysuria, frequency and urgency.  Musculoskeletal: Negative for arthralgias, joint swelling and myalgias.  Skin: Negative for pallor and rash.  Neurological: Negative for dizziness, tremors, weakness, numbness and headaches.  Hematological: Negative for adenopathy. Does  not bruise/bleed easily.  Psychiatric/Behavioral: Negative for decreased concentration and dysphoric mood. The patient is not nervous/anxious.        Objective:   Physical Exam Constitutional:      General: He is not in acute distress.    Appearance: Normal appearance. He is obese. He is not ill-appearing.  HENT:     Head: Normocephalic and atraumatic.     Comments: No sinus tenderness    Ears:     Comments: R  TM/ dull with effusion and some light colored material just anterior to it/ with mild erythema   (does not appear to be ruptured) , cannot tell if bulging   L TM- dull/loss of light reflex  Clear canal    Nose: Congestion present. No rhinorrhea.     Mouth/Throat:     Mouth: Mucous membranes are moist.     Pharynx: Oropharynx is clear. No oropharyngeal exudate or posterior oropharyngeal erythema.     Comments: Scant clear pnd Eyes:     General:        Right eye: No discharge.        Left eye: No discharge.     Extraocular Movements: Extraocular movements intact.     Conjunctiva/sclera: Conjunctivae normal.     Pupils: Pupils are equal, round, and reactive to light.  Neck:     Musculoskeletal: No neck rigidity.  Cardiovascular:     Rate and Rhythm: Normal rate and regular rhythm.  Pulmonary:     Effort:  Pulmonary effort is normal. No respiratory distress.     Breath sounds: Normal breath sounds. No wheezing or rales.  Lymphadenopathy:     Cervical: No cervical adenopathy.  Skin:    General: Skin is warm and dry.     Findings: No erythema or rash.  Neurological:     Mental Status: He is alert.     Cranial Nerves: No cranial nerve deficit.  Psychiatric:        Mood and Affect: Mood normal.           Assessment & Plan:   Problem List Items Addressed This Visit      Nervous and Auditory   Otitis media - Primary    R ear with effusion (effusion on L w/o evidence of infection) Cover with augmentin  Enc fluid intake  Continue steroid nasal spray bid to help ETD Update if not starting to improve in a week or if worsening   Pt has had tubes and ear surgery in the past-low threshold for ENT eval if needed       Relevant Medications   amoxicillin-clavulanate (AUGMENTIN) 875-125 MG tablet

## 2018-10-17 ENCOUNTER — Other Ambulatory Visit: Payer: 59

## 2018-10-21 ENCOUNTER — Other Ambulatory Visit (INDEPENDENT_AMBULATORY_CARE_PROVIDER_SITE_OTHER): Payer: 59

## 2018-10-21 DIAGNOSIS — E039 Hypothyroidism, unspecified: Secondary | ICD-10-CM | POA: Diagnosis not present

## 2018-10-21 DIAGNOSIS — M4322 Fusion of spine, cervical region: Secondary | ICD-10-CM | POA: Diagnosis not present

## 2018-10-21 LAB — TSH: TSH: 2.95 u[IU]/mL (ref 0.35–4.50)

## 2018-10-27 ENCOUNTER — Other Ambulatory Visit: Payer: Self-pay | Admitting: Family Medicine

## 2018-10-29 ENCOUNTER — Encounter: Payer: Self-pay | Admitting: Family Medicine

## 2018-10-29 ENCOUNTER — Other Ambulatory Visit: Payer: Self-pay

## 2018-10-29 ENCOUNTER — Ambulatory Visit (INDEPENDENT_AMBULATORY_CARE_PROVIDER_SITE_OTHER): Payer: 59 | Admitting: Family Medicine

## 2018-10-29 VITALS — BP 132/70 | HR 93 | Temp 98.5°F | Ht 69.0 in | Wt 240.6 lb

## 2018-10-29 DIAGNOSIS — H6121 Impacted cerumen, right ear: Secondary | ICD-10-CM

## 2018-10-29 DIAGNOSIS — H6983 Other specified disorders of Eustachian tube, bilateral: Secondary | ICD-10-CM

## 2018-10-29 DIAGNOSIS — J301 Allergic rhinitis due to pollen: Secondary | ICD-10-CM

## 2018-10-29 DIAGNOSIS — H6120 Impacted cerumen, unspecified ear: Secondary | ICD-10-CM | POA: Insufficient documentation

## 2018-10-29 NOTE — Patient Instructions (Signed)
For allergies -antihistamine (claritin or allegra or zyrtec or xyzal)  Continue flonase   If no further ear improvement in a week or so please call us for an ENT referral   No infection right now  Some fluid on left  Right looks good after wax was removed

## 2018-10-29 NOTE — Assessment & Plan Note (Signed)
Small amount Resolved completely with simple ear irrigation

## 2018-10-29 NOTE — Assessment & Plan Note (Signed)
Worse with tree pollen season Disc options for antihistamine otc  Continue flonase Allergen avoidance when able  Alert if worse

## 2018-10-29 NOTE — Assessment & Plan Note (Signed)
R OM is resolved  Cerumen removed from canal and TM looks normal L - dull TM Suspect some mild ETD (from allergic rhinitis) Urged to continue flonase and antihistamine of choice  Update if not starting to improve in a week or if worsening   Consider ENT ref if still bothering him

## 2018-10-29 NOTE — Progress Notes (Signed)
Subjective:    Patient ID: Mitchell Palmer, male    DOB: 10/20/1965, 53 y.o.   MRN: [id number]  HPI  Here with ear symptoms   Was seen 2/27 with OM of R ear (and effusions bilat)  He was tx with augmentin and told to inc his steroid ns to bid temporarily   He improved but not gone  Took 1 extra day  Can hear better   Pressure in ears  No pain / mild discomfort in R one  No discharge in either ear   No fever  Allergies are worse- stuffy nose  claritin flonase    Remote hx of ear tubes and ear surgery in the past    Wt Readings from Last 3 Encounters:  10/29/18 240 lb 9 oz (109.1 kg)  10/16/18 238 lb 4 oz (108.1 kg)  09/22/18 246 lb (111.6 kg)   35.52 kg/m   Patient Active Problem List   Diagnosis Date Noted  . Cerumen impaction 10/29/2018  . Otitis media 10/16/2018  . Skin rash 08/05/2018  . Neck pain 04/28/2018  . Need for hepatitis B screening test 03/26/2018  . Need for hepatitis C screening test 03/26/2018  . Paresthesia of both feet 11/01/2017  . Hemorrhoids 10/14/2017  . Obesity 01/24/2016  . Hypothyroidism 01/24/2016  . Uncontrolled type 2 diabetes mellitus with stage 3 chronic kidney disease, with long-term current use of insulin ([place]) 07/28/2015  . Alcohol consumption heavy 12/06/2014  . Necrobiosis diabeticorum ([place]) 03/01/2014  . Low back pain 06/17/2012  . Abnormal chest x-ray 10/15/2011  . PLANTAR FASCIITIS, TRAUMATIC 11/09/2009  . NEOPLASM UNCERTAIN BEHAVIOR OTHER SPEC SITES 10/07/2009  . ESOPHAGEAL STRICTURE 06/27/2009  . GERD 03/31/2009  . OTHER DYSPHAGIA 03/31/2009  . Hyperlipidemia 01/06/2009  . Essential hypertension 01/06/2009  . ALLERGIC RHINITIS 01/06/2009   Past Medical History:  Diagnosis Date  . Allergy    allegic rhinitis  . Diabetes mellitus    type II  . Esophageal stricture   . GERD (gastroesophageal reflux disease)   . History of hernia repair    as a baby  . Hyperlipidemia   . Hyperplastic polyp of intestine 2010   . Hypertension   . S/P ear surgery, follow-up exam    for drainage   Past Surgical History:  Procedure Laterality Date  . COLONOSCOPY    . ESOPHAGOGASTRODUODENOSCOPY  04/2009   stricture/GERD  . HERNIA REPAIR     age 30-midline  . [place]   went in behind rt ear-blockage  . POLYPECTOMY    . SHOULDER ARTHROSCOPY WITH ROTATOR CUFF REPAIR AND SUBACROMIAL DECOMPRESSION Right 09/29/2012   Procedure: SHOULDER ARTHROSCOPY WITH ROTATOR CUFF REPAIR AND SUBACROMIAL DECOMPRESSION;  Surgeon: Nita Sells, MD;  Location: [facility];  Service: Orthopedics;  Laterality: Right;  Right shoulder arthroscopy with subacromial decompression and distal clavicle excision & Debridement of Labrial Tear  . UPPER GASTROINTESTINAL ENDOSCOPY     Social History   Tobacco Use  . Smoking status: Never Smoker  . Smokeless tobacco: Never Used  Substance Use Topics  . Alcohol use: Yes    Alcohol/week: 12.0 standard drinks    Types: 12 Standard drinks or equivalent per week    Comment: 6 pk every 3 days; 12 pk/week  . Drug use: No   Family History  Problem Relation Age of Onset  . Diabetes Mother   . Colon cancer Father 66  . Diabetes Other   . Colon polyps Brother   .  Diabetes Maternal Aunt   . Cancer Cousin        breast cancer   Allergies  Allergen Reactions  . Codeine     REACTION: anxious   Current Outpatient Medications on File Prior to Visit  Medication Sig Dispense Refill  . atorvastatin (LIPITOR) 40 MG tablet Take 1 tablet (40 mg total) by mouth daily. 90 tablet 3  . B-D INS SYR ULTRAFINE 1CC/31G 31G X 5/16" 1 ML MISC INJECT 35 UNITS INTO THE SKIN EVERY 12 HOURS AS DIRECTED BY PHYSICIAN. 200 each 2  . B-D UF III MINI PEN NEEDLES 31G X 5 MM MISC USE 4 TIMES A DAY 100 each 8  . CIALIS 20 MG tablet TAKE 1 TABLET (20 MG TOTAL) BY MOUTH AS DIRECTED. 4 tablet 5  . cyclobenzaprine (FLEXERIL) 10 MG tablet Take 1 tablet (10 mg total) by mouth 3 (three) times  daily as needed. 30 tablet 3  . diclofenac (VOLTAREN) 75 MG EC tablet Reported on 02/15/2016    . empagliflozin (JARDIANCE) 10 MG TABS tablet Take 10 mg by mouth daily. 30 tablet 11  . fluticasone (FLONASE) 50 MCG/ACT nasal spray Place 2 sprays into both nostrils daily. 16 g 5  . glucose blood test strip USE TO TEST BLOOD SUGAR 4 TIMES DAILY AS DIRECTED 100 each 3  . HUMALOG KWIKPEN 100 UNIT/ML KwikPen INJECT 15-25 UNITS INTO THE SKIN 3 TIMES DAILY W/ MEALS, 10-12 UNITS INTO THE SKIN W/ SNACKS. 30 mL 2  . hydrocortisone (ANUSOL-HC) 25 MG suppository Place 1 suppository (25 mg total) rectally 2 (two) times daily as needed for hemorrhoids or anal itching. 12 suppository 1  . Insulin Glargine (BASAGLAR KWIKPEN) 100 UNIT/ML SOPN INJECT 35 UNITS UNDER SKIN AT THE BEGINNING OF YOUR DAY AND 30 UNITS AT BEDTIME 30 pen 5  . levothyroxine (SYNTHROID, LEVOTHROID) 75 MCG tablet Take 75 mcg by mouth daily before breakfast.    . metFORMIN (GLUCOPHAGE) 1000 MG tablet TAKE 1 TABLET (1,000 MG TOTAL) BY MOUTH 2 (TWO) TIMES DAILY WITH A MEAL. 60 tablet 10  . mometasone (ELOCON) 0.1 % cream Apply 1 application topically daily. To affected areas 30 g 1  . olmesartan (BENICAR) 40 MG tablet Take 40 mg by mouth daily.  11  . omeprazole (PRILOSEC) 40 MG capsule TAKE 1 CAPSULE (40 MG TOTAL) BY MOUTH DAILY. 30 capsule 11  . TRULICITY 1.5 CZ/6.6AY SOPN INJECT 1.5 MG (1 PEN) WEEKLY UNDER SKIN 2 pen 5  . vitamin B-12 (CYANOCOBALAMIN) 1000 MCG tablet Take 1,000 mcg by mouth daily.       No current facility-administered medications on file prior to visit.     Review of Systems  Constitutional: Negative for activity change, appetite change, chills, fatigue, fever and unexpected weight change.  HENT: Positive for congestion, postnasal drip and rhinorrhea. Negative for hearing loss, sinus pressure, sinus pain, sore throat and trouble swallowing.   Eyes: Negative for pain, redness, itching and visual disturbance.  Respiratory:  Negative for cough, chest tightness, shortness of breath and wheezing.        Scant cough (if any) from post nasal drip  Cardiovascular: Negative for chest pain and palpitations.  Gastrointestinal: Negative for abdominal pain, blood in stool, constipation, diarrhea and nausea.  Endocrine: Negative for cold intolerance, heat intolerance, polydipsia and polyuria.  Genitourinary: Negative for difficulty urinating, dysuria, frequency and urgency.  Musculoskeletal: Negative for arthralgias, joint swelling and myalgias.  Skin: Negative for pallor and rash.  Neurological: Negative for dizziness, tremors, weakness,  numbness and headaches.  Hematological: Negative for adenopathy. Does not bruise/bleed easily.  Psychiatric/Behavioral: Negative for decreased concentration and dysphoric mood. The patient is not nervous/anxious.        Objective:   Physical Exam Constitutional:      General: He is not in acute distress.    Appearance: Normal appearance. He is obese. He is not ill-appearing.  HENT:     Head: Normocephalic and atraumatic.     Comments: No sinus tenderness    Right Ear: There is impacted cerumen.     Left Ear: Ear canal and external ear normal. There is no impacted cerumen.     Ears:     Comments: L ear - TM is dull   R ear- partial cerumen impaction S/p simple irrigation- clear canal with nl appearing TM    Nose: Congestion and rhinorrhea present.     Mouth/Throat:     Mouth: Mucous membranes are moist.     Pharynx: Oropharynx is clear. No oropharyngeal exudate or posterior oropharyngeal erythema.     Comments: Clear pnd Eyes:     General:        Right eye: No discharge.        Left eye: No discharge.     Extraocular Movements: Extraocular movements intact.     Conjunctiva/sclera: Conjunctivae normal.     Pupils: Pupils are equal, round, and reactive to light.  Neck:     Musculoskeletal: Normal range of motion.  Cardiovascular:     Rate and Rhythm: Regular rhythm.  Tachycardia present.  Pulmonary:     Effort: Pulmonary effort is normal. No respiratory distress.     Breath sounds: No wheezing, rhonchi or rales.     Comments: Good air exch Lymphadenopathy:     Cervical: No cervical adenopathy.  Neurological:     Mental Status: He is alert.     Cranial Nerves: No cranial nerve deficit.  Psychiatric:        Mood and Affect: Mood normal.           Assessment & Plan:   Problem List Items Addressed This Visit      Respiratory   Allergic rhinitis    Worse with tree pollen season Disc options for antihistamine otc  Continue flonase Allergen avoidance when able  Alert if worse         Nervous and Auditory   ETD (eustachian tube dysfunction) - Primary    R OM is resolved  Cerumen removed from canal and TM looks normal L - dull TM Suspect some mild ETD (from allergic rhinitis) Urged to continue flonase and antihistamine of choice  Update if not starting to improve in a week or if worsening   Consider ENT ref if still bothering him      Cerumen impaction    Small amount Resolved completely with simple ear irrigation

## 2018-10-30 DIAGNOSIS — M4322 Fusion of spine, cervical region: Secondary | ICD-10-CM | POA: Diagnosis not present

## 2018-11-04 DIAGNOSIS — M4322 Fusion of spine, cervical region: Secondary | ICD-10-CM | POA: Diagnosis not present

## 2018-11-05 ENCOUNTER — Other Ambulatory Visit: Payer: Self-pay | Admitting: Internal Medicine

## 2018-11-07 ENCOUNTER — Other Ambulatory Visit: Payer: Self-pay | Admitting: Family Medicine

## 2018-11-12 ENCOUNTER — Other Ambulatory Visit: Payer: Self-pay | Admitting: Internal Medicine

## 2018-11-21 ENCOUNTER — Other Ambulatory Visit: Payer: Self-pay | Admitting: Family Medicine

## 2018-11-30 ENCOUNTER — Other Ambulatory Visit: Payer: Self-pay | Admitting: Family Medicine

## 2018-12-03 ENCOUNTER — Encounter: Payer: Self-pay | Admitting: Family Medicine

## 2018-12-03 ENCOUNTER — Ambulatory Visit (INDEPENDENT_AMBULATORY_CARE_PROVIDER_SITE_OTHER): Payer: 59 | Admitting: Family Medicine

## 2018-12-03 DIAGNOSIS — Z20822 Contact with and (suspected) exposure to covid-19: Secondary | ICD-10-CM | POA: Insufficient documentation

## 2018-12-03 DIAGNOSIS — Z20828 Contact with and (suspected) exposure to other viral communicable diseases: Secondary | ICD-10-CM | POA: Diagnosis not present

## 2018-12-03 NOTE — Progress Notes (Signed)
Virtual Visit via Video Note  I connected with Mitchell Palmer on 12/03/18 at  3:30 PM EDT by a video enabled telemedicine application and verified that I am speaking with the correct person using two identifiers. The patient is at home today  I am in my office at [place]    I discussed the limitations of evaluation and management by telemedicine and the availability of in person appointments. The patient expressed understanding and agreed to proceed.  History of Present Illness: Pt wanted to discuss situation with possible covid-19 expousre at [facility]  The letter from the practice said that patients there between 3/20and 4/2 may have been exposed -and to quarantine and monitor  He was actually there on 3/17  (3 days before that)  This is re assuring  He feels ok overall  Some pnd from allergies (clear mucous) /sneezing   No fever  No cough  No sob  No changes in blood glucose control   He is working a usual schedule at [employer]  No exposures there  He wears N 95 mask for work (can be exposed to blood borne pathogen   Takes zyrtec for allergies -this works fairly well   Does not have to return to the ortho office /finished his neck PT and doing better (doing his exercises at home)   Review of Systems  Constitutional: Negative for chills, diaphoresis, fever, malaise/fatigue and weight loss.  HENT: Negative for sinus pain and sore throat.   Respiratory: Negative for cough, sputum production, shortness of breath and wheezing.   Cardiovascular: Negative for chest pain and palpitations.  Musculoskeletal: Negative for myalgias.  Skin: Negative for rash.  Neurological: Negative for dizziness.  Endo/Heme/Allergies: Positive for environmental allergies.       Patient Active Problem List   Diagnosis Date Noted  . Cerumen impaction 10/29/2018  . ETD (eustachian tube dysfunction) 10/16/2018  . Skin rash 08/05/2018  . Neck pain 04/28/2018  .  Need for hepatitis B screening test 03/26/2018  . Need for hepatitis C screening test 03/26/2018  . Paresthesia of both feet 11/01/2017  . Hemorrhoids 10/14/2017  . Obesity 01/24/2016  . Hypothyroidism 01/24/2016  . Uncontrolled type 2 diabetes mellitus with stage 3 chronic kidney disease, with long-term current use of insulin ([place]) 07/28/2015  . Alcohol consumption heavy 12/06/2014  . Necrobiosis diabeticorum ([place]) 03/01/2014  . Low back pain 06/17/2012  . Abnormal chest x-ray 10/15/2011  . PLANTAR FASCIITIS, TRAUMATIC 11/09/2009  . NEOPLASM UNCERTAIN BEHAVIOR OTHER SPEC SITES 10/07/2009  . ESOPHAGEAL STRICTURE 06/27/2009  . GERD 03/31/2009  . OTHER DYSPHAGIA 03/31/2009  . Hyperlipidemia 01/06/2009  . Essential hypertension 01/06/2009  . Allergic rhinitis 01/06/2009   Past Medical History:  Diagnosis Date  . Allergy    allegic rhinitis  . Diabetes mellitus    type II  . Esophageal stricture   . GERD (gastroesophageal reflux disease)   . History of hernia repair    as a baby  . Hyperlipidemia   . Hyperplastic polyp of intestine 2010  . Hypertension   . S/P ear surgery, follow-up exam    for drainage   Past Surgical History:  Procedure Laterality Date  . COLONOSCOPY    . ESOPHAGOGASTRODUODENOSCOPY  04/2009   stricture/GERD  . HERNIA REPAIR     age 38-midline  . [place]   went in behind rt ear-blockage  . POLYPECTOMY    . SHOULDER ARTHROSCOPY WITH ROTATOR CUFF REPAIR AND SUBACROMIAL  DECOMPRESSION Right 09/29/2012   Procedure: SHOULDER ARTHROSCOPY WITH ROTATOR CUFF REPAIR AND SUBACROMIAL DECOMPRESSION;  Surgeon: Nita Sells, MD;  Location: [facility];  Service: Orthopedics;  Laterality: Right;  Right shoulder arthroscopy with subacromial decompression and distal clavicle excision & Debridement of Labrial Tear  . UPPER GASTROINTESTINAL ENDOSCOPY     Social History   Tobacco Use  . Smoking status: Never Smoker  . Smokeless  tobacco: Never Used  Substance Use Topics  . Alcohol use: Yes    Alcohol/week: 12.0 standard drinks    Types: 12 Standard drinks or equivalent per week    Comment: 6 pk every 3 days; 12 pk/week  . Drug use: No   Family History  Problem Relation Age of Onset  . Diabetes Mother   . Colon cancer Father 51  . Diabetes Other   . Colon polyps Brother   . Diabetes Maternal Aunt   . Cancer Cousin        breast cancer   Allergies  Allergen Reactions  . Codeine     REACTION: anxious   Current Outpatient Medications on File Prior to Visit  Medication Sig Dispense Refill  . atorvastatin (LIPITOR) 40 MG tablet TAKE 1 TABLET BY MOUTH EVERY DAY 90 tablet 1  . B-D INS SYR ULTRAFINE 1CC/31G 31G X 5/16" 1 ML MISC INJECT 35 UNITS INTO THE SKIN EVERY 12 HOURS AS DIRECTED BY PHYSICIAN. 200 each 2  . B-D UF III MINI PEN NEEDLES 31G X 5 MM MISC USE 4 TIMES A DAY 100 each 8  . CIALIS 20 MG tablet TAKE 1 TABLET (20 MG TOTAL) BY MOUTH AS DIRECTED. 4 tablet 5  . cyclobenzaprine (FLEXERIL) 10 MG tablet Take 1 tablet (10 mg total) by mouth 3 (three) times daily as needed. 30 tablet 3  . diclofenac (VOLTAREN) 75 MG EC tablet Reported on 02/15/2016    . empagliflozin (JARDIANCE) 10 MG TABS tablet Take 10 mg by mouth daily. 30 tablet 11  . fluticasone (FLONASE) 50 MCG/ACT nasal spray Place 2 sprays into both nostrils daily. 16 g 5  . glucose blood test strip USE TO TEST BLOOD SUGAR 4 TIMES DAILY AS DIRECTED 100 each 3  . HUMALOG KWIKPEN 100 UNIT/ML KwikPen INJECT 15-25 UNITS INTO THE SKIN 3 TIMES DAILY W/ MEALS, 10-12 UNITS INTO THE SKIN W/ SNACKS. 15 mL 2  . Insulin Glargine (BASAGLAR KWIKPEN) 100 UNIT/ML SOPN INJECT 35 UNITS UNDER SKIN AT THE BEGINNING OF YOUR DAY AND 30 UNITS AT BEDTIME 30 pen 5  . levothyroxine (SYNTHROID, LEVOTHROID) 75 MCG tablet TAKE 1 TABLET BY MOUTH ONCE A DAY ON AN EMPTY STOMACH 30 MINUTES BEFORE EATING 30 tablet 3  . metFORMIN (GLUCOPHAGE) 1000 MG tablet TAKE 1 TABLET (1,000 MG TOTAL)  BY MOUTH 2 (TWO) TIMES DAILY WITH A MEAL. 60 tablet 10  . mometasone (ELOCON) 0.1 % cream Apply 1 application topically daily. To affected areas 30 g 1  . olmesartan (BENICAR) 40 MG tablet TAKE 1 TABLET BY MOUTH EVERY DAY 90 tablet 1  . omeprazole (PRILOSEC) 40 MG capsule TAKE 1 CAPSULE BY MOUTH EVERY DAY 90 capsule 1  . TRULICITY 1.5 TD/4.2AJ SOPN INJECT 1.5 MG (1 PEN) WEEKLY UNDER SKIN 2 pen 5  . vitamin B-12 (CYANOCOBALAMIN) 1000 MCG tablet Take 1,000 mcg by mouth daily.       No current facility-administered medications on file prior to visit.     Observations/Objective: Patient appeared to be his normal self  Obese Normal  skin tone with no rash or flushing No cough or hoarseness on interview  Not sob with speech  No facial swelling  Pleasant and mentally sharp  Assessment and Plan: Problem List Items Addressed This Visit      Other   Exposure to Covid-19 Virus    Pt visited PT at [facility] on 11/04/18  A letter explained that patients there from 3/20 to 11/20/18 needed to watch for symptoms and quarantine  I re assured him that he was out of the window He has not had any fever or cough or other resp symptoms (besides pnd from allergies) and feels fine Disc need to wear mask in public (he wears V69 for work)  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 The patient was provided an opportunity to ask questions and all were answered. The patient agreed with the plan and demonstrated an understanding of the instructions.   The patient was advised to call back or seek an in-person evaluation if the symptoms worsen or  if the condition fails to improve as anticipated.   Loura Pardon, MD

## 2018-12-03 NOTE — Assessment & Plan Note (Signed)
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

## 2018-12-10 ENCOUNTER — Ambulatory Visit: Payer: 59 | Admitting: Internal Medicine

## 2018-12-16 ENCOUNTER — Other Ambulatory Visit: Payer: Self-pay | Admitting: Internal Medicine

## 2019-01-05 ENCOUNTER — Other Ambulatory Visit: Payer: Self-pay | Admitting: Internal Medicine

## 2019-01-29 LAB — HM DIABETES EYE EXAM

## 2019-01-30 ENCOUNTER — Encounter: Payer: Self-pay | Admitting: Internal Medicine

## 2019-02-15 ENCOUNTER — Other Ambulatory Visit: Payer: Self-pay | Admitting: Endocrinology

## 2019-02-19 ENCOUNTER — Ambulatory Visit: Payer: 59 | Admitting: Family Medicine

## 2019-02-19 ENCOUNTER — Other Ambulatory Visit: Payer: Self-pay

## 2019-02-19 ENCOUNTER — Encounter: Payer: Self-pay | Admitting: Family Medicine

## 2019-02-19 VITALS — BP 119/62 | HR 88 | Temp 98.5°F | Ht 69.0 in | Wt 233.0 lb

## 2019-02-19 DIAGNOSIS — M21622 Bunionette of left foot: Secondary | ICD-10-CM | POA: Diagnosis not present

## 2019-02-19 DIAGNOSIS — L84 Corns and callosities: Secondary | ICD-10-CM

## 2019-02-19 MED ORDER — UREA 40 % EX CREA: TOPICAL_CREAM | CUTANEOUS | 2 refills | Status: DC

## 2019-02-19 NOTE — Progress Notes (Signed)
[name] T. Mitchell Hickam, MD Primary Care and [place] at [facility] [name], [address] Phone: [phone]  FAX: [phone]  [name] - 53 y.o. male  MRN [id number]  Date of Birth: May 15, 1966  Visit Date: 02/19/2019  PCP: Abner Greenspan, MD  Referred by: Tower, Wynelle Fanny, MD  Chief Complaint  Patient presents with  . Foot Pain    Bottom of  left foot   Subjective:   Mitchell Palmer is a 53 y.o. very pleasant male patient who presents with the following:  Little toe pain, has been ongoing for several years.  Pain at the 5th MT head. Heavy callus.  Past Medical History, Surgical History, Social History, Family History, Problem List, Medications, and Allergies have been reviewed and updated if relevant.  Patient Active Problem List   Diagnosis Date Noted  . Exposure to Covid-19 Virus 12/03/2018  . Cerumen impaction 10/29/2018  . ETD (eustachian tube dysfunction) 10/16/2018  . Skin rash 08/05/2018  . Neck pain 04/28/2018  . Need for hepatitis B screening test 03/26/2018  . Need for hepatitis C screening test 03/26/2018  . Paresthesia of both feet 11/01/2017  . Hemorrhoids 10/14/2017  . Obesity 01/24/2016  . Hypothyroidism 01/24/2016  . Uncontrolled type 2 diabetes mellitus with stage 3 chronic kidney disease, with long-term current use of insulin ([place]) 07/28/2015  . Alcohol consumption heavy 12/06/2014  . Necrobiosis diabeticorum ([place]) 03/01/2014  . Low back pain 06/17/2012  . Abnormal chest x-ray 10/15/2011  . PLANTAR FASCIITIS, TRAUMATIC 11/09/2009  . NEOPLASM UNCERTAIN BEHAVIOR OTHER SPEC SITES 10/07/2009  . ESOPHAGEAL STRICTURE 06/27/2009  . GERD 03/31/2009  . OTHER DYSPHAGIA 03/31/2009  . Hyperlipidemia 01/06/2009  . Essential hypertension 01/06/2009  . Allergic rhinitis 01/06/2009    Past Medical History:  Diagnosis Date  . Allergy    allegic rhinitis  . Diabetes mellitus    type II  .  Esophageal stricture   . GERD (gastroesophageal reflux disease)   . History of hernia repair    as a baby  . Hyperlipidemia   . Hyperplastic polyp of intestine 2010  . Hypertension   . S/P ear surgery, follow-up exam    for drainage    Past Surgical History:  Procedure Laterality Date  . COLONOSCOPY    . ESOPHAGOGASTRODUODENOSCOPY  04/2009   stricture/GERD  . HERNIA REPAIR     age 48-midline  . [place]   went in behind rt ear-blockage  . POLYPECTOMY    . SHOULDER ARTHROSCOPY WITH ROTATOR CUFF REPAIR AND SUBACROMIAL DECOMPRESSION Right 09/29/2012   Procedure: SHOULDER ARTHROSCOPY WITH ROTATOR CUFF REPAIR AND SUBACROMIAL DECOMPRESSION;  Surgeon: Nita Sells, MD;  Location: [facility];  Service: Orthopedics;  Laterality: Right;  Right shoulder arthroscopy with subacromial decompression and distal clavicle excision & Debridement of Labrial Tear  . UPPER GASTROINTESTINAL ENDOSCOPY      Social History   Socioeconomic History  . Marital status: Single    Spouse name: Not on file  . Number of children: Not on file  . Years of education: Not on file  . Highest education level: Not on file  Occupational History  . Occupation: Mining engineer  Social Needs  . Financial resource strain: Not on file  . Food insecurity    Worry: Not on file    Inability: Not on file  . Transportation needs    Medical: Not on file    Non-medical: Not  on file  Tobacco Use  . Smoking status: Never Smoker  . Smokeless tobacco: Never Used  Substance and Sexual Activity  . Alcohol use: Yes    Alcohol/week: 12.0 standard drinks    Types: 12 Standard drinks or equivalent per week    Comment: 6 pk every 3 days; 12 pk/week  . Drug use: No  . Sexual activity: Not on file  Lifestyle  . Physical activity    Days per week: Not on file    Minutes per session: Not on file  . Stress: Not on file  Relationships  . Social Herbalist on phone: Not on file     Gets together: Not on file    Attends religious service: Not on file    Active member of club or organization: Not on file    Attends meetings of clubs or organizations: Not on file    Relationship status: Not on file  . Intimate partner violence    Fear of current or ex partner: Not on file    Emotionally abused: Not on file    Physically abused: Not on file    Forced sexual activity: Not on file  Other Topics Concern  . Not on file  Social History Narrative  . Not on file    Family History  Problem Relation Age of Onset  . Diabetes Mother   . Colon cancer Father 65  . Diabetes Other   . Colon polyps Brother   . Diabetes Maternal Aunt   . Cancer Cousin        breast cancer    Allergies  Allergen Reactions  . Codeine     REACTION: anxious    Medication list reviewed and updated in full in [place].  GEN: No fevers, chills. Nontoxic. Primarily MSK c/o today. MSK: Detailed in the HPI GI: tolerating PO intake without difficulty Neuro: No numbness, parasthesias, or tingling associated. Otherwise the pertinent positives of the ROS are noted above.   Objective:   BP 119/62   Pulse 88   Temp 98.5 F (36.9 C) (Temporal)   Ht 5\' 9"  (1.753 m)   Wt 233 lb (105.7 kg)   SpO2 96%   BMI 34.41 kg/m    GEN: WDWN, NAD, Non-toxic, Alert & Oriented x 3 HEENT: Atraumatic, Normocephalic.  Ears and Nose: No external deformity. EXTR: No clubbing/cyanosis/edema NEURO: Normal gait.  PSYCH: Normally interactive. Conversant. Not depressed or anxious appearing.  Calm demeanor.    Natural cavus foot. NT bony anatomy Significant transeverse arch collapse with notable bunion and 5th toes going underneath 4th and toe splaying  Radiology: No results found.  Assessment and Plan:     ICD-10-CM   1. Corn of foot  L84   2. Bunionette of left foot  M21.622    Urea, pumice stone.  New OTC spenco orthotics that he likes. I put a MT bar on his current ones, and it felt better   Patient Instructions  Apply urea cream for 1 week, then continue until feeling better  After 1 week start using a PUMICE STONE GENTLY ON THE AREA    Follow-up: No follow-ups on file.  Meds ordered this encounter  Medications  . urea (CARMOL) 40 % CREA    Sig: Add to callus each night.    Dispense:  85 g    Refill:  2   No orders of the defined types were placed in this encounter.   Signed,  Frederico Hamman  Celedonio Savage, MD   Outpatient Encounter Medications as of 02/19/2019  Medication Sig  . atorvastatin (LIPITOR) 40 MG tablet TAKE 1 TABLET BY MOUTH EVERY DAY  . B-D INS SYR ULTRAFINE 1CC/31G 31G X 5/16" 1 ML MISC INJECT 35 UNITS INTO THE SKIN EVERY 12 HOURS AS DIRECTED BY PHYSICIAN.  [name] B-D UF III MINI PEN NEEDLES 31G X 5 MM MISC USE AS DIRECTED 4 TIMES A DAY  . CIALIS 20 MG tablet TAKE 1 TABLET (20 MG TOTAL) BY MOUTH AS DIRECTED.  [name] cyclobenzaprine (FLEXERIL) 10 MG tablet Take 1 tablet (10 mg total) by mouth 3 (three) times daily as needed.  . diclofenac (VOLTAREN) 75 MG EC tablet Reported on 02/15/2016  . empagliflozin (JARDIANCE) 10 MG TABS tablet Take 10 mg by mouth daily.  . fluticasone (FLONASE) 50 MCG/ACT nasal spray Place 2 sprays into both nostrils daily.  [name] glucose blood (ONE TOUCH ULTRA TEST) test strip USE TO TEST BLOOD SUGAR 4 TIMES DAILY AS DIRECTED  . HUMALOG KWIKPEN 100 UNIT/ML KwikPen INJECT 15-25 UNITS INTO THE SKIN 3 TIMES DAILY W/ MEALS, 10-12 UNITS INTO THE SKIN W/ SNACKS.  . Insulin Glargine (BASAGLAR KWIKPEN) 100 UNIT/ML SOPN INJECT 35 UNITS UNDER SKIN AT THE BEGINNING OF YOUR DAY AND 30 UNITS AT BEDTIME  . levothyroxine (SYNTHROID, LEVOTHROID) 75 MCG tablet TAKE 1 TABLET BY MOUTH ONCE A DAY ON AN EMPTY STOMACH 30 MINUTES BEFORE EATING  . metFORMIN (GLUCOPHAGE) 1000 MG tablet TAKE 1 TABLET (1,000 MG TOTAL) BY MOUTH 2 (TWO) TIMES DAILY WITH A MEAL.  . mometasone (ELOCON) 0.1 % cream Apply 1 application topically daily. To affected areas  . olmesartan (BENICAR) 40 MG  tablet TAKE 1 TABLET BY MOUTH EVERY DAY  . omeprazole (PRILOSEC) 40 MG capsule TAKE 1 CAPSULE BY MOUTH EVERY DAY  . TRULICITY 1.5 ZE/0.9QZ SOPN INJECT 1.5 MG (1 PEN) WEEKLY UNDER SKIN  . vitamin B-12 (CYANOCOBALAMIN) 1000 MCG tablet Take 1,000 mcg by mouth daily.    . urea (CARMOL) 40 % CREA Add to callus each night.   No facility-administered encounter medications on file as of 02/19/2019.

## 2019-02-19 NOTE — Patient Instructions (Signed)
Apply urea cream for 1 week, then continue until feeling better  After 1 week start using a PUMICE STONE GENTLY ON THE AREA

## 2019-03-11 ENCOUNTER — Telehealth: Payer: Self-pay | Admitting: Internal Medicine

## 2019-03-11 MED ORDER — LANTUS SOLOSTAR 100 UNIT/ML ~~LOC~~ SOPN: PEN_INJECTOR | SUBCUTANEOUS | 11 refills | Status: DC

## 2019-03-11 NOTE — Telephone Encounter (Signed)
Patient called and states that due to insurance and cost he would like to stop the [place] and go back to Lantus.  He would need the [place] called into his pharmacy [facility] in [place]

## 2019-03-11 NOTE — Telephone Encounter (Signed)
Ok to change

## 2019-03-11 NOTE — Telephone Encounter (Signed)
basaglar dc from med list.  Lantus sent to pharmacy.

## 2019-03-11 NOTE — Telephone Encounter (Signed)
Please advise 

## 2019-03-16 ENCOUNTER — Telehealth: Payer: Self-pay | Admitting: Family Medicine

## 2019-03-16 DIAGNOSIS — I10 Essential (primary) hypertension: Secondary | ICD-10-CM

## 2019-03-16 DIAGNOSIS — E7800 Pure hypercholesterolemia, unspecified: Secondary | ICD-10-CM

## 2019-03-16 DIAGNOSIS — E039 Hypothyroidism, unspecified: Secondary | ICD-10-CM

## 2019-03-16 NOTE — Telephone Encounter (Signed)
-----   Message from Ellamae Sia sent at 03/16/2019  3:03 PM EDT ----- Regarding: Lab orders for Tuesday, 7.28.20 Lab orders for a f/u

## 2019-03-17 ENCOUNTER — Other Ambulatory Visit: Payer: Self-pay

## 2019-03-17 ENCOUNTER — Other Ambulatory Visit (INDEPENDENT_AMBULATORY_CARE_PROVIDER_SITE_OTHER): Payer: 59

## 2019-03-17 DIAGNOSIS — E7800 Pure hypercholesterolemia, unspecified: Secondary | ICD-10-CM

## 2019-03-17 DIAGNOSIS — I10 Essential (primary) hypertension: Secondary | ICD-10-CM

## 2019-03-17 LAB — COMPREHENSIVE METABOLIC PANEL
ALT: 23 U/L (ref 0–53)
AST: 20 U/L (ref 0–37)
Albumin: 4.5 g/dL (ref 3.5–5.2)
Alkaline Phosphatase: 77 U/L (ref 39–117)
BUN: 15 mg/dL (ref 6–23)
CO2: 29 mEq/L (ref 19–32)
Calcium: 9.6 mg/dL (ref 8.4–10.5)
Chloride: 98 mEq/L (ref 96–112)
Creatinine, Ser: 1.14 mg/dL (ref 0.40–1.50)
GFR: 67.45 mL/min (ref >60.00)
Glucose, Bld: 135 mg/dL — ABNORMAL HIGH (ref 70–99)
Potassium: 3.8 mEq/L (ref 3.5–5.1)
Sodium: 136 mEq/L (ref 135–145)
Total Bilirubin: 0.7 mg/dL (ref 0.2–1.2)
Total Protein: 6.5 g/dL (ref 6.0–8.3)

## 2019-03-17 LAB — LIPID PANEL
Cholesterol: 141 mg/dL (ref 0–200)
HDL: 42 mg/dL (ref >39.00)
NonHDL: 99.37
Total CHOL/HDL Ratio: 3
Triglycerides: 291 mg/dL — ABNORMAL HIGH (ref 0.0–149.0)
VLDL: 58.2 mg/dL — ABNORMAL HIGH (ref 0.0–40.0)

## 2019-03-17 LAB — LDL CHOLESTEROL, DIRECT: Direct LDL: 63 mg/dL

## 2019-03-17 NOTE — Addendum Note (Signed)
Addended by: Lurlean Nanny on: 03/17/2019 07:49 AM   Modules accepted: Orders

## 2019-03-19 ENCOUNTER — Other Ambulatory Visit: Payer: Self-pay | Admitting: Internal Medicine

## 2019-03-23 ENCOUNTER — Encounter: Payer: Self-pay | Admitting: Family Medicine

## 2019-03-23 ENCOUNTER — Other Ambulatory Visit: Payer: Self-pay

## 2019-03-23 ENCOUNTER — Ambulatory Visit: Payer: 59 | Admitting: Family Medicine

## 2019-03-23 VITALS — BP 122/68 | HR 74 | Temp 98.0°F | Ht 69.0 in | Wt 233.6 lb

## 2019-03-23 DIAGNOSIS — H6983 Other specified disorders of Eustachian tube, bilateral: Secondary | ICD-10-CM

## 2019-03-23 DIAGNOSIS — IMO0002 Reserved for concepts with insufficient information to code with codable children: Secondary | ICD-10-CM

## 2019-03-23 DIAGNOSIS — Z794 Long term (current) use of insulin: Secondary | ICD-10-CM

## 2019-03-23 DIAGNOSIS — Z789 Other specified health status: Secondary | ICD-10-CM

## 2019-03-23 DIAGNOSIS — E7800 Pure hypercholesterolemia, unspecified: Secondary | ICD-10-CM

## 2019-03-23 DIAGNOSIS — E1165 Type 2 diabetes mellitus with hyperglycemia: Secondary | ICD-10-CM

## 2019-03-23 DIAGNOSIS — E039 Hypothyroidism, unspecified: Secondary | ICD-10-CM

## 2019-03-23 DIAGNOSIS — I10 Essential (primary) hypertension: Secondary | ICD-10-CM

## 2019-03-23 DIAGNOSIS — N183 Chronic kidney disease, stage 3 (moderate): Secondary | ICD-10-CM

## 2019-03-23 DIAGNOSIS — E1122 Type 2 diabetes mellitus with diabetic chronic kidney disease: Secondary | ICD-10-CM | POA: Diagnosis not present

## 2019-03-23 DIAGNOSIS — Z6836 Body mass index (BMI) 36.0-36.9, adult: Secondary | ICD-10-CM

## 2019-03-23 NOTE — Assessment & Plan Note (Signed)
Continues f/u with endocrinology

## 2019-03-23 NOTE — Assessment & Plan Note (Signed)
bp in fair control at this time  BP Readings from Last 1 Encounters:  03/23/19 122/68   No changes needed Most recent labs reviewed  Disc lifstyle change with low sodium diet and exercise

## 2019-03-23 NOTE — Assessment & Plan Note (Signed)
Counseled on cutting or d/c alcohol for better health/diabetes  He admits to drinking moonshine last month for his birthday

## 2019-03-23 NOTE — Patient Instructions (Addendum)
Get a flu shot in the fall  Keep taking good care of your feet   Keep watching diet  Also keep loosing weight  Stay active   Minimize alcohol the best you can

## 2019-03-23 NOTE — Progress Notes (Signed)
Subjective:    Patient ID: Mitchell Palmer, male    DOB: 1965-11-12, 53 y.o.   MRN: [id number]  HPI Here for f/u of chronic health problems   R ear is bothering him Pops to yawn  No drainage Does not think he grinds his teeth  Has been this way since his surgery    Wt Readings from Last 3 Encounters:  03/23/19 233 lb 9 oz (105.9 kg)  02/19/19 233 lb (105.7 kg)  10/29/18 240 lb 9 oz (109.1 kg)  loosing weight  - more healthy choices - eats low cal meals/apples  Eats a honey bun when sugar gets low  34.49 kg/m   bp is stable today  No cp or palpitations or headaches or edema  No side effects to medicines  BP Readings from Last 3 Encounters:  03/23/19 122/68  02/19/19 119/62  10/29/18 132/70     DM2: Sees endocrinology for management  lantus  jardiance  trulicity Sees Dr Cruzita Lederer- has appt later this month  Lab Results  Component Value Date   HGBA1C 7.6 (A) 09/03/2018  glucose recent 135 -not off from his usual  At work/day time 175 and then 101 if he does not eat  Lot of steps in at work  No extra exercise- too hot outside   Drank more alcohol on during his birthday month (moonshine) =he does not volunteer how much   Hypothyroidism  Pt has no clinical changes No change in energy level/ hair or skin/ edema and no tremor Lab Results  Component Value Date   TSH 2.95 10/21/2018      Hyperlipidemia  Lab Results  Component Value Date   CHOL 141 03/17/2019   CHOL 117 09/18/2018   CHOL 110 10/31/2017   Lab Results  Component Value Date   HDL 42.00 03/17/2019   HDL 35.40 (L) 09/18/2018   HDL 38.70 (L) 10/31/2017   Lab Results  Component Value Date   LDLCALC 51 09/18/2018   LDLCALC 36 10/31/2017   LDLCALC 62 09/30/2013   Lab Results  Component Value Date   TRIG 291.0 (H) 03/17/2019   TRIG 153.0 (H) 09/18/2018   TRIG 177.0 (H) 10/31/2017   Lab Results  Component Value Date   CHOLHDL 3 03/17/2019   CHOLHDL 3 09/18/2018   CHOLHDL 3 10/31/2017   Lab Results  Component Value Date   LDLDIRECT 63.0 03/17/2019   LDLDIRECT 48.0 09/21/2016   LDLDIRECT 59.0 01/23/2016   Atorvastatin and diet  Triglycerides are quite labile   He is interested in HIV test with next labs    Patient Active Problem List   Diagnosis Date Noted  . Exposure to Covid-19 Virus 12/03/2018  . ETD (eustachian tube dysfunction) 10/16/2018  . Skin rash 08/05/2018  . Neck pain 04/28/2018  . Need for hepatitis B screening test 03/26/2018  . Need for hepatitis C screening test 03/26/2018  . Paresthesia of both feet 11/01/2017  . Hemorrhoids 10/14/2017  . Obesity 01/24/2016  . Hypothyroidism 01/24/2016  . Uncontrolled type 2 diabetes mellitus with stage 3 chronic kidney disease, with long-term current use of insulin ([place]) 07/28/2015  . Alcohol consumption heavy 12/06/2014  . Necrobiosis diabeticorum ([place]) 03/01/2014  . Low back pain 06/17/2012  . Abnormal chest x-ray 10/15/2011  . PLANTAR FASCIITIS, TRAUMATIC 11/09/2009  . NEOPLASM UNCERTAIN BEHAVIOR OTHER SPEC SITES 10/07/2009  . ESOPHAGEAL STRICTURE 06/27/2009  . GERD 03/31/2009  . OTHER DYSPHAGIA 03/31/2009  . Hyperlipidemia 01/06/2009  . Essential hypertension 01/06/2009  . Allergic  rhinitis 01/06/2009   Past Medical History:  Diagnosis Date  . Allergy    allegic rhinitis  . Diabetes mellitus    type II  . Esophageal stricture   . GERD (gastroesophageal reflux disease)   . History of hernia repair    as a baby  . Hyperlipidemia   . Hyperplastic polyp of intestine 2010  . Hypertension   . S/P ear surgery, follow-up exam    for drainage   Past Surgical History:  Procedure Laterality Date  . COLONOSCOPY    . ESOPHAGOGASTRODUODENOSCOPY  04/2009   stricture/GERD  . HERNIA REPAIR     age 89-midline  . [place]   went in behind rt ear-blockage  . POLYPECTOMY    . SHOULDER ARTHROSCOPY WITH ROTATOR CUFF REPAIR AND SUBACROMIAL DECOMPRESSION Right 09/29/2012   Procedure:  SHOULDER ARTHROSCOPY WITH ROTATOR CUFF REPAIR AND SUBACROMIAL DECOMPRESSION;  Surgeon: Nita Sells, MD;  Location: [facility];  Service: Orthopedics;  Laterality: Right;  Right shoulder arthroscopy with subacromial decompression and distal clavicle excision & Debridement of Labrial Tear  . UPPER GASTROINTESTINAL ENDOSCOPY     Social History   Tobacco Use  . Smoking status: Never Smoker  . Smokeless tobacco: Never Used  Substance Use Topics  . Alcohol use: Yes    Alcohol/week: 12.0 standard drinks    Types: 12 Standard drinks or equivalent per week    Comment: 6 pk every 3 days; 12 pk/week  . Drug use: No   Family History  Problem Relation Age of Onset  . Diabetes Mother   . Colon cancer Father 69  . Diabetes Other   . Colon polyps Brother   . Diabetes Maternal Aunt   . Cancer Cousin        breast cancer   Allergies  Allergen Reactions  . Codeine     REACTION: anxious   Current Outpatient Medications on File Prior to Visit  Medication Sig Dispense Refill  . atorvastatin (LIPITOR) 40 MG tablet TAKE 1 TABLET BY MOUTH EVERY DAY 90 tablet 1  . B-D INS SYR ULTRAFINE 1CC/31G 31G X 5/16" 1 ML MISC INJECT 35 UNITS INTO THE SKIN EVERY 12 HOURS AS DIRECTED BY PHYSICIAN. 200 each 2  . B-D UF III MINI PEN NEEDLES 31G X 5 MM MISC USE AS DIRECTED 4 TIMES A DAY 100 each 8  . CIALIS 20 MG tablet TAKE 1 TABLET (20 MG TOTAL) BY MOUTH AS DIRECTED. 4 tablet 5  . cyclobenzaprine (FLEXERIL) 10 MG tablet Take 1 tablet (10 mg total) by mouth 3 (three) times daily as needed. 30 tablet 3  . diclofenac (VOLTAREN) 75 MG EC tablet Reported on 02/15/2016    . empagliflozin (JARDIANCE) 10 MG TABS tablet Take 10 mg by mouth daily. 30 tablet 11  . fluticasone (FLONASE) 50 MCG/ACT nasal spray Place 2 sprays into both nostrils daily. 16 g 5  . HUMALOG KWIKPEN 100 UNIT/ML KwikPen INJECT 15-25 UNITS INTO THE SKIN 3 TIMES DAILY W/ MEALS, 10-12 UNITS INTO THE SKIN W/ SNACKS. 15 mL 2  .  Insulin Glargine (LANTUS SOLOSTAR) 100 UNIT/ML Solostar Pen Inject 35 units in the morning and 30 units in the evening 15 pen 11  . levothyroxine (SYNTHROID, LEVOTHROID) 75 MCG tablet TAKE 1 TABLET BY MOUTH ONCE A DAY ON AN EMPTY STOMACH 30 MINUTES BEFORE EATING 30 tablet 3  . metFORMIN (GLUCOPHAGE) 1000 MG tablet TAKE 1 TABLET (1,000 MG TOTAL) BY MOUTH 2 (TWO) TIMES DAILY  WITH A MEAL. 60 tablet 10  . mometasone (ELOCON) 0.1 % cream Apply 1 application topically daily. To affected areas 30 g 1  . olmesartan (BENICAR) 40 MG tablet TAKE 1 TABLET BY MOUTH EVERY DAY 90 tablet 1  . omeprazole (PRILOSEC) 40 MG capsule TAKE 1 CAPSULE BY MOUTH EVERY DAY 90 capsule 1  . ONETOUCH ULTRA test strip USE TO TEST BLOOD SUGAR 4 TIMES DAILY AS DIRECTED 100 strip 3  . TRULICITY 1.5 TI/4.5YK SOPN INJECT 1.5 MG (1 PEN) WEEKLY UNDER SKIN 2 pen 5  . urea (CARMOL) 40 % CREA Add to callus each night. 85 g 2  . vitamin B-12 (CYANOCOBALAMIN) 1000 MCG tablet Take 1,000 mcg by mouth daily.       No current facility-administered medications on file prior to visit.     Review of Systems  Constitutional: Negative for activity change, appetite change, fatigue, fever and unexpected weight change.  HENT: Negative for congestion, rhinorrhea, sore throat and trouble swallowing.   Eyes: Negative for pain, redness, itching and visual disturbance.  Respiratory: Negative for cough, chest tightness, shortness of breath and wheezing.   Cardiovascular: Negative for chest pain and palpitations.  Gastrointestinal: Negative for abdominal pain, blood in stool, constipation, diarrhea and nausea.  Endocrine: Negative for cold intolerance, heat intolerance, polydipsia and polyuria.  Genitourinary: Negative for difficulty urinating, dysuria, frequency and urgency.  Musculoskeletal: Negative for arthralgias, joint swelling and myalgias.  Skin: Negative for pallor and rash.  Neurological: Negative for dizziness, tremors, weakness, numbness and  headaches.  Hematological: Negative for adenopathy. Does not bruise/bleed easily.  Psychiatric/Behavioral: Negative for decreased concentration and dysphoric mood. The patient is not nervous/anxious.        Objective:   Physical Exam Constitutional:      General: He is not in acute distress.    Appearance: Normal appearance. He is well-developed. He is obese. He is not ill-appearing or diaphoretic.  HENT:     Head: Normocephalic and atraumatic.     Right Ear: Tympanic membrane and ear canal normal.     Left Ear: Tympanic membrane and ear canal normal.     Mouth/Throat:     Mouth: Mucous membranes are moist.     Pharynx: Oropharynx is clear.  Eyes:     Conjunctiva/sclera: Conjunctivae normal.     Pupils: Pupils are equal, round, and reactive to light.  Neck:     Musculoskeletal: Normal range of motion and neck supple.     Thyroid: No thyromegaly.     Vascular: No carotid bruit or JVD.  Cardiovascular:     Rate and Rhythm: Normal rate and regular rhythm.     Heart sounds: Normal heart sounds. No gallop.   Pulmonary:     Effort: Pulmonary effort is normal. No respiratory distress.     Breath sounds: Normal breath sounds. No wheezing or rales.  Abdominal:     General: Bowel sounds are normal. There is no distension or abdominal bruit.     Palpations: Abdomen is soft. There is no mass.     Tenderness: There is no abdominal tenderness. There is no guarding.     Hernia: No hernia is present.  Musculoskeletal:     Right lower leg: No edema.     Left lower leg: No edema.  Lymphadenopathy:     Cervical: No cervical adenopathy.  Skin:    General: Skin is warm and dry.     Findings: No rash.  Neurological:     Mental Status:  He is alert. Mental status is at baseline.     Cranial Nerves: No cranial nerve deficit.     Sensory: No sensory deficit.     Coordination: Coordination normal.     Deep Tendon Reflexes: Reflexes are normal and symmetric. Reflexes normal.  Psychiatric:         Mood and Affect: Mood normal.           Assessment & Plan:   Problem List Items Addressed This Visit      Cardiovascular and Mediastinum   Essential hypertension - Primary    bp in fair control at this time  BP Readings from Last 1 Encounters:  03/23/19 122/68   No changes needed Most recent labs reviewed  Disc lifstyle change with low sodium diet and exercise          Endocrine   Uncontrolled type 2 diabetes mellitus with stage 3 chronic kidney disease, with long-term current use of insulin ([place])    Continues f/u with endocrinology      Hypothyroidism    Hypothyroidism  Pt has no clinical changes No change in energy level/ hair or skin/ edema and no tremor Lab Results  Component Value Date   TSH 2.95 10/21/2018            Nervous and Auditory   ETD (eustachian tube dysfunction)    This ear bothers him on and off  Nl exam today        Other   Hyperlipidemia (Chronic)    Disc goals for lipids and reasons to control them Rev last labs with pt Rev low sat fat diet in detail  Good LDL control , HDL above 40 Trig are labile with fluctuation in blood sugar      Alcohol consumption heavy    Counseled on cutting or d/c alcohol for better health/diabetes  He admits to drinking moonshine last month for his birthday      Obesity    Discussed how this problem influences overall health and the risks it imposes  Reviewed plan for weight loss with lower calorie diet (via better food choices and also portion control or program like weight watchers) and exercise building up to or more than 30 minutes 5 days per week including some aerobic activity

## 2019-03-23 NOTE — Assessment & Plan Note (Signed)
Discussed how this problem influences overall health and the risks it imposes  Reviewed plan for weight loss with lower calorie diet (via better food choices and also portion control or program like weight watchers) and exercise building up to or more than 30 minutes 5 days per week including some aerobic activity    

## 2019-03-23 NOTE — Assessment & Plan Note (Signed)
This ear bothers him on and off  Nl exam today

## 2019-03-23 NOTE — Assessment & Plan Note (Signed)
Disc goals for lipids and reasons to control them Rev last labs with pt Rev low sat fat diet in detail  Good LDL control , HDL above 40 Trig are labile with fluctuation in blood sugar

## 2019-03-23 NOTE — Assessment & Plan Note (Signed)
Hypothyroidism  Pt has no clinical changes No change in energy level/ hair or skin/ edema and no tremor Lab Results  Component Value Date   TSH 2.95 10/21/2018

## 2019-03-26 ENCOUNTER — Other Ambulatory Visit: Payer: Self-pay | Admitting: Family Medicine

## 2019-03-27 ENCOUNTER — Other Ambulatory Visit: Payer: Self-pay | Admitting: Family Medicine

## 2019-04-08 ENCOUNTER — Telehealth: Payer: Self-pay | Admitting: Internal Medicine

## 2019-04-08 NOTE — Telephone Encounter (Signed)
Caller wanted to find out status of Lantus RX, confirmed that it was sent 03/11/2019 to his pharmacy

## 2019-04-14 ENCOUNTER — Other Ambulatory Visit: Payer: Self-pay

## 2019-04-16 ENCOUNTER — Encounter: Payer: Self-pay | Admitting: Internal Medicine

## 2019-04-16 ENCOUNTER — Ambulatory Visit (INDEPENDENT_AMBULATORY_CARE_PROVIDER_SITE_OTHER): Payer: 59 | Admitting: Internal Medicine

## 2019-04-16 ENCOUNTER — Other Ambulatory Visit: Payer: Self-pay

## 2019-04-16 VITALS — BP 130/80 | HR 76 | Ht 69.0 in | Wt 231.0 lb

## 2019-04-16 DIAGNOSIS — E7800 Pure hypercholesterolemia, unspecified: Secondary | ICD-10-CM | POA: Diagnosis not present

## 2019-04-16 DIAGNOSIS — IMO0002 Reserved for concepts with insufficient information to code with codable children: Secondary | ICD-10-CM

## 2019-04-16 DIAGNOSIS — E039 Hypothyroidism, unspecified: Secondary | ICD-10-CM | POA: Diagnosis not present

## 2019-04-16 DIAGNOSIS — Z794 Long term (current) use of insulin: Secondary | ICD-10-CM

## 2019-04-16 DIAGNOSIS — E1122 Type 2 diabetes mellitus with diabetic chronic kidney disease: Secondary | ICD-10-CM | POA: Diagnosis not present

## 2019-04-16 DIAGNOSIS — E1165 Type 2 diabetes mellitus with hyperglycemia: Secondary | ICD-10-CM

## 2019-04-16 DIAGNOSIS — N183 Chronic kidney disease, stage 3 (moderate): Secondary | ICD-10-CM | POA: Diagnosis not present

## 2019-04-16 LAB — POCT GLYCOSYLATED HEMOGLOBIN (HGB A1C): Hemoglobin A1C: 7.1 % — AB (ref 4.0–5.6)

## 2019-04-16 MED ORDER — METFORMIN HCL 1000 MG PO TABS: ORAL_TABLET | ORAL | 3 refills | Status: DC

## 2019-04-16 MED ORDER — LANTUS SOLOSTAR 100 UNIT/ML ~~LOC~~ SOPN: PEN_INJECTOR | SUBCUTANEOUS | 11 refills | Status: DC

## 2019-04-16 MED ORDER — INSULIN LISPRO (1 UNIT DIAL) 100 UNIT/ML (KWIKPEN): PEN_INJECTOR | SUBCUTANEOUS | 3 refills | Status: DC

## 2019-04-16 NOTE — Patient Instructions (Addendum)
Please continue: - Metformin 1000 mg 2x a day with meals - Jardiance 10 mg before breakfast - Trulicity 1.5 mg weekly  - Basaglar 35 units 2x a day  - Humalog 20-25 units before each meal  Please continue Levothyroxine 75 mcg daily.  Take the thyroid hormone every day, with water, at least 30 minutes before breakfast, separated by at least 4 hours from: - acid reflux medications - calcium - iron - multivitamins  Please return in 4 months with your sugar log.

## 2019-04-16 NOTE — Progress Notes (Signed)
Subjective:     Patient ID: Mitchell Palmer, male   DOB: 1966-06-17, 53 y.o.   MRN: [id number]  HPI Mitchell Palmer is a 53 y.o. man, returning for followup for DM2, dx in 2000, uncontrolled, insulin-dependent, with complications (CKD stage 3, erectile dysfunction, mild DR). Last visit 7 months ago.  Before last visit, he started to reduced alcohol (liquid), fast food, fried foods, pizza.  Sugar started to improve and they appear further improved at today's visit.   His last hemoglobin A1c was: Lab Results  Component Value Date   HGBA1C 7.6 (A) 09/03/2018   HGBA1C 8.2 (A) 05/21/2018   HGBA1C 7.4 (A) 01/27/2018   HGBA1C 7.6 10/21/2017   HGBA1C 8.0 05/10/2017   HGBA1C 7.4 11/15/2016   HGBA1C 7.6 08/15/2016   HGBA1C 7.7 05/15/2016   HGBA1C 7.8 02/15/2016   HGBA1C 7.7 11/08/2015   HGBA1C 8.0 (H) 07/22/2015   HGBA1C 8.9 (H) 12/06/2014   HGBA1C 9.1 (H) 03/01/2014   HGBA1C 8.9 (H) 09/30/2013   HGBA1C 9.5 (H) 05/26/2013   HGBA1C 9.9 (H) 01/28/2013   HGBA1C 10.0 (H) 10/14/2012   He had steroid inj's in the past.  He is on: - Metformin 1000 mg 2x a day with meals - Trulicity 1.5 mg weekly  - Basaglar >> Lantus 35 units 2x a day - Humalog: 20-25(-30) units before meals  - 15 min before eating - Jardiance 10 mg in am - added 08/2018 He was on: - he stopped  Invokana 08/2016 as he had leg tingling - Cycloset - started 04/22/2013 >> at 1.6 mg daily (tried 3 tabs >> dizziness >> backed off to 2) - Victoza 1.8 mg daily - Actos 45 mg daily - Januvia - glyburide/metformin - Avandia. - Bydureon 2 mg weekly - he hated the thick needle   He is checking his sugars 2-4 times a day: - before b'fast: when comes from work: 125, 180-200 >> 160s >> 130 - 2 h after b'fast:  79-85  -honeybun >> 181-215 >> n/c - before lunch: 200-220s (20 units) >> 73-166 >> n/c - 2 h after lunch:  130-145 >> 171-184 >> 230 >> n/c - Before dinner:n/c >> 84, 155-190 >> 125-245 >> 170-180 >> 79, 105-160 - nighttime:  74, 112-199 >> at work 80, 140-180 >> 280-290 >> 150-180 Lowest: 74 >> 79 >> 83 >> 79;  He has hypoglycemia awareness in the 80s. Highest 232 >> 250 >> 483 (in the hospital) >> 310.  He works nights: 6 pm - 7:30 am, 3.5 days a week.  Meals: - 5:30-6 pm (at work): subway sandwich >> 2 hot dogs + doritos - snack at 9-10 pm: pistachios or 1/2 sandwich; pizza  >> 12 am: nabs +peanuts - 11 am-12 pm: salad, meat + veggies + rice (leftovers from take out), pizza rarely >> pizza  Trying to eat an apple a day.  -+ HL. Last lipid panel: Lab Results  Component Value Date   CHOL 141 03/17/2019   HDL 42.00 03/17/2019   LDLCALC 51 09/18/2018   LDLDIRECT 63.0 03/17/2019   TRIG 291.0 (H) 03/17/2019   CHOLHDL 3 03/17/2019  On Lipitor.  -+ Stage II CKD, last BUN/Cr:  Lab Results  Component Value Date   BUN 15 03/17/2019   CREATININE 1.14 03/17/2019   Lab Results  Component Value Date   MICRALBCREAT 0.9 02/15/2016   MICRALBCREAT 1.0 12/07/2014   MICRALBCREAT 0.5 11/04/2012  On olmesartan.  - last eye exam was 01/2019: + DR.  Dr. Katy Fitch >>  now Dr. Zadie Rhine.  He had laser surgery [facility].  Also, In 07/2018 >> had EMG surgery in his R eye for bleeding.  - No numbness or tingling in his legs  He also has a history of HTN, GERD, esophageal stricture, plantar fasciitis, eustachian tube dysfunction - status post ear surgery.  Hypothyroidism: -Started levothyroxine 01/2016.   -He was skipping doses in the past and TFTs were fluctuating.  He also skipped doses at the end of last year TSH elevated in 08/2018.  Pt is on levothyroxine 75 Mcg daily, taken: - in am - fasting - at least 30 min from b'fast - no Ca, Fe, MVI, PPIs - not on Biotin  Latest TSH was normal: Lab Results  Component Value Date   TSH 2.95 10/21/2018   TSH 5.18 (H) 09/18/2018   TSH 4.25 10/31/2017   TSH 1.17 09/21/2016   TSH 3.05 04/24/2016   TSH 4.97 (H) 03/06/2016   TSH 8.97 (H) 01/23/2016   TSH 7.07 (H) 12/06/2014   TSH  2.18 09/30/2013   TSH 2.57 01/29/2013   At the end of 07/2018 >> had C spine surgery.  Review of Systems  Constitutional: no weight gain/no weight loss, no fatigue, no subjective hyperthermia, no subjective hypothermia Eyes: no blurry vision, no xerophthalmia ENT: no sore throat, no nodules palpated in neck, no dysphagia, no odynophagia, no hoarseness Cardiovascular: no CP/no SOB/no palpitations/no leg swelling Respiratory: no cough/no SOB/no wheezing Gastrointestinal: no N/no V/no D/no C/no acid reflux Musculoskeletal: no muscle aches/no joint aches Skin: no rashes, no hair loss Neurological: no tremors/no numbness/no tingling/no dizziness  I reviewed pt's medications, allergies, PMH, social hx, family hx, and changes were documented in the history of present illness. Otherwise, unchanged from my initial visit note.  Past Medical History:  Diagnosis Date  . Allergy    allegic rhinitis  . Diabetes mellitus    type II  . Esophageal stricture   . GERD (gastroesophageal reflux disease)   . History of hernia repair    as a baby  . Hyperlipidemia   . Hyperplastic polyp of intestine 2010  . Hypertension   . S/P ear surgery, follow-up exam    for drainage   Past Surgical History:  Procedure Laterality Date  . COLONOSCOPY    . ESOPHAGOGASTRODUODENOSCOPY  04/2009   stricture/GERD  . HERNIA REPAIR     age 62-midline  . [place]   went in behind rt ear-blockage  . POLYPECTOMY    . SHOULDER ARTHROSCOPY WITH ROTATOR CUFF REPAIR AND SUBACROMIAL DECOMPRESSION Right 09/29/2012   Procedure: SHOULDER ARTHROSCOPY WITH ROTATOR CUFF REPAIR AND SUBACROMIAL DECOMPRESSION;  Surgeon: Nita Sells, MD;  Location: [facility];  Service: Orthopedics;  Laterality: Right;  Right shoulder arthroscopy with subacromial decompression and distal clavicle excision & Debridement of Labrial Tear  . UPPER GASTROINTESTINAL ENDOSCOPY     Social History   Socioeconomic  History  . Marital status: Single    Spouse name: Not on file  . Number of children: Not on file  . Years of education: Not on file  . Highest education level: Not on file  Occupational History  . Occupation: Mining engineer  Social Needs  . Financial resource strain: Not on file  . Food insecurity    Worry: Not on file    Inability: Not on file  . Transportation needs    Medical: Not on file    Non-medical: Not on file  Tobacco Use  . Smoking status:  Never Smoker  . Smokeless tobacco: Never Used  Substance and Sexual Activity  . Alcohol use: Yes    Alcohol/week: 12.0 standard drinks    Types: 12 Standard drinks or equivalent per week    Comment: 6 pk every 3 days; 12 pk/week  . Drug use: No  . Sexual activity: Not on file  Lifestyle  . Physical activity    Days per week: Not on file    Minutes per session: Not on file  . Stress: Not on file  Relationships  . Social Herbalist on phone: Not on file    Gets together: Not on file    Attends religious service: Not on file    Active member of club or organization: Not on file    Attends meetings of clubs or organizations: Not on file    Relationship status: Not on file  . Intimate partner violence    Fear of current or ex partner: Not on file    Emotionally abused: Not on file    Physically abused: Not on file    Forced sexual activity: Not on file  Other Topics Concern  . Not on file  Social History Narrative  . Not on file   Current Outpatient Medications on File Prior to Visit  Medication Sig Dispense Refill  . atorvastatin (LIPITOR) 40 MG tablet TAKE 1 TABLET BY MOUTH EVERY DAY 90 tablet 1  . B-D INS SYR ULTRAFINE 1CC/31G 31G X 5/16" 1 ML MISC INJECT 35 UNITS INTO THE SKIN EVERY 12 HOURS AS DIRECTED BY PHYSICIAN. 200 each 2  . B-D UF III MINI PEN NEEDLES 31G X 5 MM MISC USE AS DIRECTED 4 TIMES A DAY 100 each 8  . CIALIS 20 MG tablet TAKE 1 TABLET (20 MG TOTAL) BY MOUTH AS DIRECTED. 4 tablet 5  .  cyclobenzaprine (FLEXERIL) 10 MG tablet Take 1 tablet (10 mg total) by mouth 3 (three) times daily as needed. 30 tablet 3  . diclofenac (VOLTAREN) 75 MG EC tablet Reported on 02/15/2016    . empagliflozin (JARDIANCE) 10 MG TABS tablet Take 10 mg by mouth daily. 30 tablet 11  . fluticasone (FLONASE) 50 MCG/ACT nasal spray SPRAY 2 SPRAYS INTO EACH NOSTRIL EVERY DAY 16 mL 5  . HUMALOG KWIKPEN 100 UNIT/ML KwikPen INJECT 15-25 UNITS INTO THE SKIN 3 TIMES DAILY W/ MEALS, 10-12 UNITS INTO THE SKIN W/ SNACKS. 15 mL 2  . Insulin Glargine (LANTUS SOLOSTAR) 100 UNIT/ML Solostar Pen Inject 35 units in the morning and 30 units in the evening 15 pen 11  . levothyroxine (SYNTHROID) 75 MCG tablet TAKE 1 TABLET BY MOUTH ONCE A DAY ON AN EMPTY STOMACH 30 MINUTES BEFORE EATING. 30 tablet 5  . metFORMIN (GLUCOPHAGE) 1000 MG tablet TAKE 1 TABLET (1,000 MG TOTAL) BY MOUTH 2 (TWO) TIMES DAILY WITH A MEAL. 60 tablet 10  . mometasone (ELOCON) 0.1 % cream Apply 1 application topically daily. To affected areas 30 g 1  . olmesartan (BENICAR) 40 MG tablet TAKE 1 TABLET BY MOUTH EVERY DAY 90 tablet 1  . omeprazole (PRILOSEC) 40 MG capsule TAKE 1 CAPSULE BY MOUTH EVERY DAY 90 capsule 1  . ONETOUCH ULTRA test strip USE TO TEST BLOOD SUGAR 4 TIMES DAILY AS DIRECTED 100 strip 3  . TRULICITY 1.5 0000000 SOPN INJECT 1.5 MG (1 PEN) WEEKLY UNDER SKIN 2 pen 5  . urea (CARMOL) 40 % CREA Add to callus each night. 85 g 2  . vitamin  B-12 (CYANOCOBALAMIN) 1000 MCG tablet Take 1,000 mcg by mouth daily.       No current facility-administered medications on file prior to visit.    Allergies  Allergen Reactions  . Codeine     REACTION: anxious   Family History  Problem Relation Age of Onset  . Diabetes Mother   . Colon cancer Father 49  . Diabetes Other   . Colon polyps Brother   . Diabetes Maternal Aunt   . Cancer Cousin        breast cancer     Objective:   Physical Exam BP 130/80   Pulse 76   Ht 5\' 9"  (1.753 m)   Wt 231  lb (104.8 kg)   SpO2 98%   BMI 34.11 kg/m  Body mass index is 34.11 kg/m.   Wt Readings from Last 3 Encounters:  04/16/19 231 lb (104.8 kg)  03/23/19 233 lb 9 oz (105.9 kg)  02/19/19 233 lb (105.7 kg)   Constitutional: overweight, in NAD Eyes: PERRLA, EOMI, no exophthalmos ENT: moist mucous membranes, no thyromegaly, no cervical lymphadenopathy Cardiovascular: RRR, No MRG Respiratory: CTA B Gastrointestinal: abdomen soft, NT, ND, BS+ Musculoskeletal: no deformities, strength intact in all 4 Skin: moist, warm, no rashes Neurological: no tremor with outstretched hands, DTR normal in all 4  Assessment:     1. 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 HL  3. Hypothyroidism    Plan:     Patient with longstanding, uncontrolled, type 2 diabetes, with persistent poor diet, on a complex medication regimen with metformin, GLP-1 receptor agonist and basal-bolus insulin, and SGLT 2 inhibitor (added at last visit).  At that time, I advised him to start Jardiance low dose.  He was previously on Invokana but we stopped it in 04/2017 due to leg tingling.  Retrospectively, the symptoms could have been related to neck osteoarthritis with cervical neck compression. -Last visit, HbA1c was slightly better, at 7.6%.  He was telling me that he improved his diet and was not drinking as much alcohol.  However, he was very sedentary after his cervical spine surgery.  Sugars were still above target then.  However, since last visit, they improved significantly.  Is still not drinking as much alcohol and trying to work on his diet.  Sugars are at goal at most times of the day with few exceptions.  We will not change his regimen at this visit. - I advised him to: Patient Instructions  Please continue: - Metformin 1000 mg 2x a day with meals - Jardiance 10 mg before breakfast - Trulicity 1.5 mg weekly  - Basaglar 35 units  2x a day  - Humalog 20-25 units before each meal  Please return in 3-4 months with your sugar log.   - we checked his HbA1c: 7.1% (better) - advised to check sugars at different times of the day - 3x a day, rotating check times - advised for yearly eye exams >> he is UTD - return to clinic in 3-4 months  2. HL  - Reviewed latest lipid panel from 02/2019: LDL at goal, triglycerides high, HDL normal Lab Results  Component Value Date   CHOL 141 03/17/2019   HDL 42.00 03/17/2019   LDLCALC 51 09/18/2018   LDLDIRECT 63.0 03/17/2019   TRIG 291.0 (H) 03/17/2019   CHOLHDL 3 03/17/2019  - Continues Lipitor without side effects.  3. Hypothyroidism - latest thyroid labs reviewed with  pt >> normal 10/2018.  He had a higher TSH in 08/2018 but he admitted that he missed 2 doses of levothyroxine before the check. - he continues on LT4 75 mcg daily now taken consistently - pt feels good on this dose. - we discussed about taking the thyroid hormone every day, with water, >30 minutes before breakfast, separated by >4 hours from acid reflux medications, calcium, iron, multivitamins. Pt. is taking it correctly.  Philemon Kingdom, MD PhD [facility]

## 2019-04-19 ENCOUNTER — Other Ambulatory Visit: Payer: Self-pay | Admitting: Internal Medicine

## 2019-04-26 ENCOUNTER — Other Ambulatory Visit: Payer: Self-pay | Admitting: Family Medicine

## 2019-05-09 ENCOUNTER — Other Ambulatory Visit: Payer: Self-pay | Admitting: Family Medicine

## 2019-05-28 ENCOUNTER — Other Ambulatory Visit: Payer: Self-pay

## 2019-05-28 ENCOUNTER — Ambulatory Visit (INDEPENDENT_AMBULATORY_CARE_PROVIDER_SITE_OTHER): Payer: 59

## 2019-05-28 DIAGNOSIS — Z23 Encounter for immunization: Secondary | ICD-10-CM | POA: Diagnosis not present

## 2019-05-31 ENCOUNTER — Other Ambulatory Visit: Payer: Self-pay | Admitting: Internal Medicine

## 2019-06-20 ENCOUNTER — Other Ambulatory Visit: Payer: Self-pay | Admitting: Internal Medicine

## 2019-07-17 ENCOUNTER — Other Ambulatory Visit: Payer: Self-pay | Admitting: Internal Medicine

## 2019-07-29 ENCOUNTER — Other Ambulatory Visit: Payer: Self-pay | Admitting: Internal Medicine

## 2019-08-04 ENCOUNTER — Other Ambulatory Visit: Payer: Self-pay | Admitting: Family Medicine

## 2019-08-17 ENCOUNTER — Ambulatory Visit: Payer: 59 | Admitting: Internal Medicine

## 2019-09-02 ENCOUNTER — Other Ambulatory Visit: Payer: Self-pay | Admitting: Family Medicine

## 2019-09-04 ENCOUNTER — Other Ambulatory Visit: Payer: Self-pay

## 2019-09-08 ENCOUNTER — Encounter: Payer: Self-pay | Admitting: Internal Medicine

## 2019-09-08 ENCOUNTER — Ambulatory Visit (INDEPENDENT_AMBULATORY_CARE_PROVIDER_SITE_OTHER): Payer: 59 | Admitting: Internal Medicine

## 2019-09-08 VITALS — BP 140/78 | HR 80 | Ht 69.0 in | Wt 234.0 lb

## 2019-09-08 DIAGNOSIS — E7800 Pure hypercholesterolemia, unspecified: Secondary | ICD-10-CM

## 2019-09-08 DIAGNOSIS — E1122 Type 2 diabetes mellitus with diabetic chronic kidney disease: Secondary | ICD-10-CM | POA: Diagnosis not present

## 2019-09-08 DIAGNOSIS — Z794 Long term (current) use of insulin: Secondary | ICD-10-CM

## 2019-09-08 DIAGNOSIS — E039 Hypothyroidism, unspecified: Secondary | ICD-10-CM

## 2019-09-08 DIAGNOSIS — N183 Chronic kidney disease, stage 3 unspecified: Secondary | ICD-10-CM | POA: Diagnosis not present

## 2019-09-08 DIAGNOSIS — IMO0002 Reserved for concepts with insufficient information to code with codable children: Secondary | ICD-10-CM

## 2019-09-08 DIAGNOSIS — E1165 Type 2 diabetes mellitus with hyperglycemia: Secondary | ICD-10-CM | POA: Diagnosis not present

## 2019-09-08 LAB — POCT GLYCOSYLATED HEMOGLOBIN (HGB A1C): Hemoglobin A1C: 7.5 % — AB (ref 4.0–5.6)

## 2019-09-08 MED ORDER — TRULICITY 3 MG/0.5ML ~~LOC~~ SOAJ: 3.0000 mg | SUBCUTANEOUS | 11 refills | Status: DC

## 2019-09-08 NOTE — Progress Notes (Signed)
Subjective:    This visit occurred during the SARS-CoV-2 public health emergency.  Safety protocols were in place, including screening questions prior to the visit, additional usage of staff PPE, and extensive cleaning of exam room while observing appropriate contact time as indicated for disinfecting solutions.    Patient ID: Mitchell Palmer, male   DOB: 09-Apr-1966, 54 y.o.   MRN: [id number]  HPI Mitchell Palmer is a 54 y.o. man, returning for followup for DM2, dx in 2000, uncontrolled, insulin-dependent, with complications (CKD stage 3, erectile dysfunction, mild DR). Last visit 5 months ago.  He had 2 steroid injections earlier this month >> sugars in the 300-400s.  He had to increase his Humalog dose provisionally.  Sugars have now decreased to baseline.  Reviewed his HbA1c levels Lab Results  Component Value Date   HGBA1C 7.1 (A) 04/16/2019   HGBA1C 7.6 (A) 09/03/2018   HGBA1C 8.2 (A) 05/21/2018   HGBA1C 7.4 (A) 01/27/2018   HGBA1C 7.6 10/21/2017   HGBA1C 8.0 05/10/2017   HGBA1C 7.4 11/15/2016   HGBA1C 7.6 08/15/2016   HGBA1C 7.7 05/15/2016   HGBA1C 7.8 02/15/2016   HGBA1C 7.7 11/08/2015   HGBA1C 8.0 (H) 07/22/2015   HGBA1C 8.9 (H) 12/06/2014   HGBA1C 9.1 (H) 03/01/2014   HGBA1C 8.9 (H) 09/30/2013   HGBA1C 9.5 (H) 05/26/2013   HGBA1C 9.9 (H) 01/28/2013   HGBA1C 10.0 (H) 10/14/2012  He had steroid inj's in the past.  He is on: - Metformin 1000 mg 2x a day with meals - Jardiance 10 mg before breakfast - added [id number] - Trulicity 1.5 mg weekly  - Basaglar 35 mg 2x a day - Humalog 20-25(30 with steroids) units before each meal  He was on: - he stopped  Invokana 08/2016 as he had leg tingling - Cycloset - started 04/22/2013 >> at 1.6 mg daily (tried 3 tabs >> dizziness >> backed off to 2) - Victoza 1.8 mg daily - Actos 45 mg daily - Januvia - glyburide/metformin - Avandia. - Bydureon 2 mg weekly - he hated the thick needle   He is checking his sugars 2-4 times a day: -  before b'fast: 125, 180-200 >> 160s >> 130 >> 80s, 120-195, 220 (bedtime) - 2 h after b'fast:  79-85>> 181-215 >> n/c - before lunch: 200-220s (20 units) >> 73-166 >> n/c>> 111-140 - 2 h after lunch:  130-145 >> 171-184 >> 230 >> n/c - Before dinner:125-245 >> 170-180 >> 79, 105-160 >> (4 pm): 120-155 - nighttime: at work 80, 140-180 >> 280-290 >> 150-180 >> 190 after snack Lowest: 83 >> 79 >> 80s;  He has hypoglycemia awareness in the 80s. Highest 483 (in the hospital) >> 310 >> 450 (steroids).  He works nights: 6 PM to 7:30 AM 3.5 days a week.  Meals: - 5:30-6 pm (at work): subway sandwich >> 2 hot dogs + doritos - snack at 9-10 pm: pistachios or 1/2 sandwich; pizza  >> 12 am: nabs +peanuts - 11 am-12 pm: salad, meat + veggies + rice (leftovers from take out), pizza rarely >> pizza  Trying to eat an apple a day.  -+ HL. Last lipid panel: Lab Results  Component Value Date   CHOL 141 03/17/2019   HDL 42.00 03/17/2019   LDLCALC 51 09/18/2018   LDLDIRECT 63.0 03/17/2019   TRIG 291.0 (H) 03/17/2019   CHOLHDL 3 03/17/2019  On Lipitor.  -+ Stage II CKD, last BUN/Cr:  Lab Results  Component Value Date   BUN 15 03/17/2019  CREATININE 1.14 03/17/2019   Lab Results  Component Value Date   MICRALBCREAT 0.9 02/15/2016   MICRALBCREAT 1.0 12/07/2014   MICRALBCREAT 0.5 11/04/2012  On olmesartan.  - last eye exam was 06/2019: + DR.  Dr. Katy Fitch >> now Dr. Zadie Rhine.  He had laser surgery [facility].  Also, In 07/2018 >> had EMG surgery in his R eye for bleeding. Since last visit, he had cataract sx (Dr. Katy Fitch), another surgery (Dr. Zadie Rhine) on R eye.  - he denies numbness or tingling in his legs  He also has a history of HTN, GERD, esophageal stricture, plantar fasciitis, eustachian tube dysfunction - status post ear surgery.  Hypothyroidism: -He started levothyroxine 01/2016. -He was skipping doses in the past and TFTs were fluctuating.  He also skipped doses at the end of last year TSH  elevated in 08/2018.  Pt is on levothyroxine 75 mcg daily, taken: - in am - fasting - at least 30 min from b'fast - no Ca, Fe, MVI, PPIs - not on Biotin  Latest TSH was normal: Lab Results  Component Value Date   TSH 2.95 10/21/2018   TSH 5.18 (H) 09/18/2018   TSH 4.25 10/31/2017   TSH 1.17 09/21/2016   TSH 3.05 04/24/2016   TSH 4.97 (H) 03/06/2016   TSH 8.97 (H) 01/23/2016   TSH 7.07 (H) 12/06/2014   TSH 2.18 09/30/2013   TSH 2.57 01/29/2013   At the end of 07/2018 >> had C spine surgery.  Review of Systems  Constitutional: + weight gain/no weight loss, no fatigue, no subjective hyperthermia, no subjective hypothermia Eyes: no blurry vision, no xerophthalmia ENT: no sore throat, no nodules palpated in neck, no dysphagia, no odynophagia, no hoarseness Cardiovascular: no CP/no SOB/no palpitations/no leg swelling Respiratory: no cough/no SOB/no wheezing Gastrointestinal: no N/no V/no D/no C/no acid reflux Musculoskeletal: no muscle aches/no joint aches Skin: no rashes, no hair loss Neurological: no tremors/no numbness/no tingling/no dizziness  I reviewed pt's medications, allergies, PMH, social hx, family hx, and changes were documented in the history of present illness. Otherwise, unchanged from my initial visit note.  Past Medical History:  Diagnosis Date  . Allergy    allegic rhinitis  . Diabetes mellitus    type II  . Esophageal stricture   . GERD (gastroesophageal reflux disease)   . History of hernia repair    as a baby  . Hyperlipidemia   . Hyperplastic polyp of intestine 2010  . Hypertension   . S/P ear surgery, follow-up exam    for drainage   Past Surgical History:  Procedure Laterality Date  . COLONOSCOPY    . ESOPHAGOGASTRODUODENOSCOPY  04/2009   stricture/GERD  . HERNIA REPAIR     age 68-midline  . [place]   went in behind rt ear-blockage  . POLYPECTOMY    . SHOULDER ARTHROSCOPY WITH ROTATOR CUFF REPAIR AND SUBACROMIAL  DECOMPRESSION Right 09/29/2012   Procedure: SHOULDER ARTHROSCOPY WITH ROTATOR CUFF REPAIR AND SUBACROMIAL DECOMPRESSION;  Surgeon: Nita Sells, MD;  Location: [place];  Service: Orthopedics;  Laterality: Right;  Right shoulder arthroscopy with subacromial decompression and distal clavicle excision & Debridement of Labrial Tear  . UPPER GASTROINTESTINAL ENDOSCOPY     Social History   Socioeconomic History  . Marital status: Single    Spouse name: Not on file  . Number of children: Not on file  . Years of education: Not on file  . Highest education level: Not on file  Occupational History  .  Occupation: Mining engineer  Tobacco Use  . Smoking status: Never Smoker  . Smokeless tobacco: Never Used  Substance and Sexual Activity  . Alcohol use: Yes    Alcohol/week: 12.0 standard drinks    Types: 12 Standard drinks or equivalent per week    Comment: 6 pk every 3 days; 12 pk/week  . Drug use: No  . Sexual activity: Not on file  Other Topics Concern  . Not on file  Social History Narrative  . Not on file   Social Determinants of Health   Financial Resource Strain:   . Difficulty of Paying Living Expenses: Not on file  Food Insecurity:   . Worried About Charity fundraiser in the Last Year: Not on file  . Ran Out of Food in the Last Year: Not on file  Transportation Needs:   . Lack of Transportation (Medical): Not on file  . Lack of Transportation (Non-Medical): Not on file  Physical Activity:   . Days of Exercise per Week: Not on file  . Minutes of Exercise per Session: Not on file  Stress:   . Feeling of Stress : Not on file  Social Connections:   . Frequency of Communication with Friends and Family: Not on file  . Frequency of Social Gatherings with Friends and Family: Not on file  . Attends Religious Services: Not on file  . Active Member of Clubs or Organizations: Not on file  . Attends Archivist Meetings: Not on file  . Marital Status:  Not on file  Intimate Partner Violence:   . Fear of Current or Ex-Partner: Not on file  . Emotionally Abused: Not on file  . Physically Abused: Not on file  . Sexually Abused: Not on file   Current Outpatient Medications on File Prior to Visit  Medication Sig Dispense Refill  . atorvastatin (LIPITOR) 40 MG tablet TAKE 1 TABLET BY MOUTH EVERY DAY 90 tablet 2  . B-D INS SYR ULTRAFINE 1CC/31G 31G X 5/16" 1 ML MISC INJECT 35 UNITS INTO THE SKIN EVERY 12 HOURS AS DIRECTED BY PHYSICIAN. 200 each 2  . B-D UF III MINI PEN NEEDLES 31G X 5 MM MISC USE AS DIRECTED 4 TIMES A DAY 100 each 8  . CIALIS 20 MG tablet TAKE 1 TABLET (20 MG TOTAL) BY MOUTH AS DIRECTED. 4 tablet 5  . cyclobenzaprine (FLEXERIL) 10 MG tablet Take 1 tablet (10 mg total) by mouth 3 (three) times daily as needed. 30 tablet 3  . diclofenac (VOLTAREN) 75 MG EC tablet Reported on 02/15/2016    . fluticasone (FLONASE) 50 MCG/ACT nasal spray SPRAY 2 SPRAYS INTO EACH NOSTRIL EVERY DAY 16 mL 5  . Insulin Glargine (LANTUS SOLOSTAR) 100 UNIT/ML Solostar Pen Inject 35 units in the morning and 35 units in the evening 15 pen 11  . insulin lispro (HUMALOG KWIKPEN) 100 UNIT/ML KwikPen INJECT 15-25 UNITS INTO THE SKIN 3 TIMES DAILY W/ MEALS, 10-12 UNITS INTO THE SKIN W/ SNACKS. 15 mL 1  . JARDIANCE 10 MG TABS tablet TAKE 1 TABLET BY MOUTH EVERY DAY 30 tablet 11  . levothyroxine (SYNTHROID) 75 MCG tablet TAKE 1 TABLET BY MOUTH ONCE A DAY ON AN EMPTY STOMACH 30 MINUTES BEFORE EATING. 30 tablet 5  . metFORMIN (GLUCOPHAGE) 1000 MG tablet TAKE 1 TABLET (1,000 MG TOTAL) BY MOUTH 2 (TWO) TIMES DAILY WITH A MEAL. 180 tablet 3  . mometasone (ELOCON) 0.1 % cream Apply 1 application topically daily. To affected areas 30 g 1  .  olmesartan (BENICAR) 40 MG tablet TAKE 1 TABLET BY MOUTH EVERY DAY 30 tablet 5  . omeprazole (PRILOSEC) 40 MG capsule TAKE 1 CAPSULE BY MOUTH EVERY DAY 90 capsule 1  . ONETOUCH ULTRA test strip USE TO TEST BLOOD SUGAR 4 TIMES DAILY AS  DIRECTED 100 strip 3  . TRULICITY 1.5 0000000 SOPN INJECT 1.5 MG (1 PEN) WEEKLY UNDER SKIN 2 pen 5  . urea (CARMOL) 40 % CREA Add to callus each night. 85 g 2  . vitamin B-12 (CYANOCOBALAMIN) 1000 MCG tablet Take 1,000 mcg by mouth daily.       No current facility-administered medications on file prior to visit.   Allergies  Allergen Reactions  . Codeine     REACTION: anxious   Family History  Problem Relation Age of Onset  . Diabetes Mother   . Colon cancer Father 76  . Diabetes Other   . Colon polyps Brother   . Diabetes Maternal Aunt   . Cancer Cousin        breast cancer     Objective:   Physical Exam BP 140/78   Pulse 80   Ht 5\' 9"  (1.753 m)   Wt 234 lb (106.1 kg)   SpO2 97%   BMI 34.56 kg/m  Body mass index is 34.56 kg/m.   Wt Readings from Last 3 Encounters:  09/08/19 234 lb (106.1 kg)  04/16/19 231 lb (104.8 kg)  03/23/19 233 lb 9 oz (105.9 kg)   Constitutional: overweight, in NAD Eyes: PERRLA, EOMI, no exophthalmos ENT: moist mucous membranes, no thyromegaly, no cervical lymphadenopathy Cardiovascular: RRR, No MRG Respiratory: CTA B Gastrointestinal: abdomen soft, NT, ND, BS+ Musculoskeletal: no deformities, strength intact in all 4 Skin: moist, warm, no rashes Neurological: no tremor with outstretched hands, DTR normal in all 4  Assessment:     1. 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 HL  3. Hypothyroidism    Plan:     Patient with longstanding, uncontrolled, type 2 diabetes, on a complex medication regimen including basal-bolus insulin regimen, weekly GLP-1 receptor agonist, Metformin, and SGLT2 inhibitor, with still suboptimal control.  At last visit, HbA1c was better, at 7.1% after improving his diet and not drinking as much alcohol.  We did not change his regimen at that time due to improvement in his blood sugars. -At this visit, sugars are similar to  before, but they have been higher during his steroid treatments earlier this month.  He had to increase his Humalog dose to 30 units, but even on this dose, he had some very high blood sugars in the 300s and 400s.  As of now, sugars are still higher than goal throughout the day so we discussed about increasing the Trulicity dose.  If the sugars increase significantly after this dose change, I will suggest to start decreasing the insulin. -Of note, Trulicity 1.5 mg dose is free for him.  I am hoping that the higher dose is also free. - I advised him to: Patient Instructions   Please continue: - Metformin 1000 mg 2x a day with meals - Jardiance 10 mg before breakfast - Basaglar 35 mg 2x a day - Humalog 20-25 units before each meal  Please increase: - Trulicity 3 mg weekly   Please return in 4 months with your sugar log.   - we checked his HbA1c: 7.5% (higher) - advised to check sugars at different times of the day -  3x a day, rotating check times - advised for yearly eye exams >> he is UTD - return to clinic in 4 months  2. HL  -Reviewed latest lipid panel from 02/2019: LDL at goal, triglycerides high, HDL normal Lab Results  Component Value Date   CHOL 141 03/17/2019   HDL 42.00 03/17/2019   LDLCALC 51 09/18/2018   LDLDIRECT 63.0 03/17/2019   TRIG 291.0 (H) 03/17/2019   CHOLHDL 3 03/17/2019  -Continues Lipitor without side effects.  3. Hypothyroidism - latest thyroid labs reviewed with pt >> normal: Lab Results  Component Value Date   TSH 2.95 10/21/2018   - he continues on LT4 75 mcg daily - pt feels good on this dose. - we discussed about taking the thyroid hormone every day, with water, >30 minutes before breakfast, separated by >4 hours from acid reflux medications, calcium, iron, multivitamins. Pt. is taking it correctly. - will check thyroid tests at next visit with PCP per his preference   Philemon Kingdom, MD PhD [facility]

## 2019-09-08 NOTE — Patient Instructions (Signed)
  Please continue: - Metformin 1000 mg 2x a day with meals - Jardiance 10 mg before breakfast - Basaglar 35 mg 2x a day - Humalog 20-25 units before each meal  Please increase: - Trulicity 3 mg weekly   Please return in 4 months with your sugar log.

## 2019-09-18 ENCOUNTER — Other Ambulatory Visit: Payer: Self-pay | Admitting: Internal Medicine

## 2019-10-19 ENCOUNTER — Telehealth: Payer: Self-pay | Admitting: Family Medicine

## 2019-10-19 DIAGNOSIS — I10 Essential (primary) hypertension: Secondary | ICD-10-CM

## 2019-10-19 DIAGNOSIS — E039 Hypothyroidism, unspecified: Secondary | ICD-10-CM

## 2019-10-19 DIAGNOSIS — R5382 Chronic fatigue, unspecified: Secondary | ICD-10-CM

## 2019-10-19 NOTE — Telephone Encounter (Signed)
Liver tests are usually done   I need diagnoses to link the other tests to (they won't be paid for as part of a physical) ? History of trouble with testosterone or B12 ?  Any symptoms of pancreatitis?   Let me know ,thanks

## 2019-10-19 NOTE — Telephone Encounter (Signed)
Patient called.  Patient scheduled cpx on 10/23/19 and labs on 10/20/19. Patient's requesting his testosterone,b-12,liver and pancreas function checked.  Patient said he's not having any problems. He said it's been awhile since they were checked.

## 2019-10-20 ENCOUNTER — Other Ambulatory Visit (INDEPENDENT_AMBULATORY_CARE_PROVIDER_SITE_OTHER): Payer: 59

## 2019-10-20 ENCOUNTER — Other Ambulatory Visit: Payer: Self-pay

## 2019-10-20 DIAGNOSIS — E039 Hypothyroidism, unspecified: Secondary | ICD-10-CM

## 2019-10-20 DIAGNOSIS — R5383 Other fatigue: Secondary | ICD-10-CM | POA: Insufficient documentation

## 2019-10-20 DIAGNOSIS — R5382 Chronic fatigue, unspecified: Secondary | ICD-10-CM | POA: Diagnosis not present

## 2019-10-20 DIAGNOSIS — I10 Essential (primary) hypertension: Secondary | ICD-10-CM

## 2019-10-20 LAB — TESTOSTERONE: Testosterone: 241.36 ng/dL — ABNORMAL LOW (ref 300.00–890.00)

## 2019-10-20 LAB — CBC WITH DIFFERENTIAL/PLATELET
Basophils Absolute: 0.1 10*3/uL (ref 0.0–0.1)
Basophils Relative: 1.2 % (ref 0.0–3.0)
Eosinophils Absolute: 0.2 10*3/uL (ref 0.0–0.7)
Eosinophils Relative: 3 % (ref 0.0–5.0)
HCT: 45.8 % (ref 39.0–52.0)
Hemoglobin: 16.1 g/dL (ref 13.0–17.0)
Lymphocytes Relative: 25.2 % (ref 12.0–46.0)
Lymphs Abs: 1.7 10*3/uL (ref 0.7–4.0)
MCHC: 35.2 g/dL (ref 30.0–36.0)
MCV: 99.3 fl (ref 78.0–100.0)
Monocytes Absolute: 0.7 10*3/uL (ref 0.1–1.0)
Monocytes Relative: 10.6 % (ref 3.0–12.0)
Neutro Abs: 4 10*3/uL (ref 1.4–7.7)
Neutrophils Relative %: 60 % (ref 43.0–77.0)
Platelets: 225 10*3/uL (ref 150.0–400.0)
RBC: 4.61 Mil/uL (ref 4.22–5.81)
RDW: 12.8 % (ref 11.5–15.5)
WBC: 6.6 10*3/uL (ref 4.0–10.5)

## 2019-10-20 LAB — COMPREHENSIVE METABOLIC PANEL
ALT: 35 U/L (ref 0–53)
AST: 23 U/L (ref 0–37)
Albumin: 4.1 g/dL (ref 3.5–5.2)
Alkaline Phosphatase: 81 U/L (ref 39–117)
BUN: 10 mg/dL (ref 6–23)
CO2: 30 mEq/L (ref 19–32)
Calcium: 9.5 mg/dL (ref 8.4–10.5)
Chloride: 101 mEq/L (ref 96–112)
Creatinine, Ser: 1.14 mg/dL (ref 0.40–1.50)
GFR: 67.3 mL/min (ref >60.00)
Glucose, Bld: 211 mg/dL — ABNORMAL HIGH (ref 70–99)
Potassium: 4.8 mEq/L (ref 3.5–5.1)
Sodium: 140 mEq/L (ref 135–145)
Total Bilirubin: 0.7 mg/dL (ref 0.2–1.2)
Total Protein: 6.5 g/dL (ref 6.0–8.3)

## 2019-10-20 LAB — LIPID PANEL
Cholesterol: 104 mg/dL (ref 0–200)
HDL: 40.6 mg/dL (ref >39.00)
LDL Cholesterol: 37 mg/dL (ref 0–99)
NonHDL: 63.59
Total CHOL/HDL Ratio: 3
Triglycerides: 133 mg/dL (ref 0.0–149.0)
VLDL: 26.6 mg/dL (ref 0.0–40.0)

## 2019-10-20 LAB — LIPASE: Lipase: 27 U/L (ref 11.0–59.0)

## 2019-10-20 LAB — TSH: TSH: 1.85 u[IU]/mL (ref 0.35–4.50)

## 2019-10-20 LAB — VITAMIN B12: Vitamin B-12: 436 pg/mL (ref 211–911)

## 2019-10-20 NOTE — Telephone Encounter (Signed)
Pt is here in office, having fatigue that's why wants the B12 and testosterone, no sxs of pancreatitis just wants it checked

## 2019-10-23 ENCOUNTER — Encounter: Payer: 59 | Admitting: Family Medicine

## 2019-11-07 ENCOUNTER — Other Ambulatory Visit: Payer: Self-pay | Admitting: Internal Medicine

## 2019-11-08 ENCOUNTER — Other Ambulatory Visit: Payer: Self-pay | Admitting: Family Medicine

## 2019-11-10 ENCOUNTER — Other Ambulatory Visit: Payer: Self-pay

## 2019-11-10 ENCOUNTER — Encounter: Payer: Self-pay | Admitting: Family Medicine

## 2019-11-10 ENCOUNTER — Ambulatory Visit: Payer: 59 | Admitting: Family Medicine

## 2019-11-10 DIAGNOSIS — L02224 Furuncle of groin: Secondary | ICD-10-CM | POA: Insufficient documentation

## 2019-11-10 MED ORDER — SULFAMETHOXAZOLE-TRIMETHOPRIM 800-160 MG PO TABS: 1.0000 | ORAL_TABLET | Freq: Two times a day (BID) | ORAL | 0 refills | Status: DC

## 2019-11-10 NOTE — Patient Instructions (Addendum)
Keep the boil area clean with soap and water  Warm compresses and heat from the shower are helpful   It may drain - keep it clean  Take bactrim ds twice daily for a week   If this gets worse- bigger/ painful / red streaks -please let me know  Also if you feel poorly or feverish let us know

## 2019-11-10 NOTE — Progress Notes (Signed)
Subjective:    Patient ID: Mitchell Palmer, male    DOB: 02-15-1966, 54 y.o.   MRN: [id number]  This visit occurred during the SARS-CoV-2 public health emergency.  Safety protocols were in place, including screening questions prior to the visit, additional usage of staff PPE, and extensive cleaning of exam room while observing appropriate contact time as indicated for disinfecting solutions.    HPI Pt presents for ? Abscess/infection in R groin area  Appeared 2 d ago   Has a h/o diabetes 2 uncontrolled/insulin dependent with CKD   Wt Readings from Last 3 Encounters:  11/10/19 235 lb (106.6 kg)  09/08/19 234 lb (106.1 kg)  04/16/19 231 lb (104.8 kg)   34.70 kg/m   Has a knot that has been there before -possible scar tissue from hernia surgery as a child   This came up 2 d ago  R lower abdomen Very tender  Not draining  Red / feels a little warm   He feels fine  Not feverish   Patient Active Problem List   Diagnosis Date Noted  . Boil of groin 11/10/2019  . Fatigue 10/20/2019  . Exposure to COVID-19 virus 12/03/2018  . ETD (eustachian tube dysfunction) 10/16/2018  . Skin rash 08/05/2018  . Neck pain 04/28/2018  . Need for hepatitis B screening test 03/26/2018  . Need for hepatitis C screening test 03/26/2018  . Paresthesia of both feet 11/01/2017  . Hemorrhoids 10/14/2017  . Obesity 01/24/2016  . Hypothyroidism 01/24/2016  . Uncontrolled type 2 diabetes mellitus with stage 3 chronic kidney disease, with long-term current use of insulin ([place]) 07/28/2015  . Alcohol consumption heavy 12/06/2014  . Necrobiosis diabeticorum ([place]) 03/01/2014  . Low back pain 06/17/2012  . Abnormal chest x-ray 10/15/2011  . PLANTAR FASCIITIS, TRAUMATIC 11/09/2009  . NEOPLASM UNCERTAIN BEHAVIOR OTHER SPEC SITES 10/07/2009  . ESOPHAGEAL STRICTURE 06/27/2009  . GERD 03/31/2009  . OTHER DYSPHAGIA 03/31/2009  . Hyperlipidemia associated with type 2 diabetes mellitus ([place]) 01/06/2009  .  Essential hypertension 01/06/2009  . Allergic rhinitis 01/06/2009   Past Medical History:  Diagnosis Date  . Allergy    allegic rhinitis  . Diabetes mellitus    type II  . Esophageal stricture   . GERD (gastroesophageal reflux disease)   . History of hernia repair    as a baby  . Hyperlipidemia   . Hyperplastic polyp of intestine 2010  . Hypertension   . S/P ear surgery, follow-up exam    for drainage   Past Surgical History:  Procedure Laterality Date  . COLONOSCOPY    . ESOPHAGOGASTRODUODENOSCOPY  04/2009   stricture/GERD  . HERNIA REPAIR     age 4-midline  . [place]   went in behind rt ear-blockage  . POLYPECTOMY    . SHOULDER ARTHROSCOPY WITH ROTATOR CUFF REPAIR AND SUBACROMIAL DECOMPRESSION Right 09/29/2012   Procedure: SHOULDER ARTHROSCOPY WITH ROTATOR CUFF REPAIR AND SUBACROMIAL DECOMPRESSION;  Surgeon: Nita Sells, MD;  Location: [place];  Service: Orthopedics;  Laterality: Right;  Right shoulder arthroscopy with subacromial decompression and distal clavicle excision & Debridement of Labrial Tear  . UPPER GASTROINTESTINAL ENDOSCOPY     Social History   Tobacco Use  . Smoking status: Never Smoker  . Smokeless tobacco: Never Used  Substance Use Topics  . Alcohol use: Yes    Alcohol/week: 12.0 standard drinks    Types: 12 Standard drinks or equivalent per week    Comment: 6 pk every  3 days; 12 pk/week  . Drug use: No   Family History  Problem Relation Age of Onset  . Diabetes Mother   . Colon cancer Father 33  . Diabetes Other   . Colon polyps Brother   . Diabetes Maternal Aunt   . Cancer Cousin        breast cancer   Allergies  Allergen Reactions  . Codeine     REACTION: anxious   Current Outpatient Medications on File Prior to Visit  Medication Sig Dispense Refill  . atorvastatin (LIPITOR) 40 MG tablet TAKE 1 TABLET BY MOUTH EVERY DAY 90 tablet 2  . B-D INS SYR ULTRAFINE 1CC/31G 31G X 5/16" 1 ML MISC  INJECT 35 UNITS INTO THE SKIN EVERY 12 HOURS AS DIRECTED BY PHYSICIAN. 200 each 2  . B-D UF III MINI PEN NEEDLES 31G X 5 MM MISC USE AS DIRECTED 4 TIMES A DAY 100 each 8  . CIALIS 20 MG tablet TAKE 1 TABLET (20 MG TOTAL) BY MOUTH AS DIRECTED. 4 tablet 5  . cyclobenzaprine (FLEXERIL) 10 MG tablet Take 1 tablet (10 mg total) by mouth 3 (three) times daily as needed. 30 tablet 3  . Dulaglutide (TRULICITY) 3 0000000 SOPN Inject 3 mg into the skin once a week. 4 pen 11  . fluticasone (FLONASE) 50 MCG/ACT nasal spray SPRAY 2 SPRAYS INTO EACH NOSTRIL EVERY DAY 16 mL 5  . Insulin Glargine (LANTUS SOLOSTAR) 100 UNIT/ML Solostar Pen Inject 35 units in the morning and 35 units in the evening 15 pen 11  . insulin lispro (HUMALOG KWIKPEN) 100 UNIT/ML KwikPen INJECT 15-25 UNITS INTO THE SKIN 3 TIMES DAILY W/ MEALS, 10-12 UNITS INTO THE SKIN W/ SNACKS. 15 mL 1  . JARDIANCE 10 MG TABS tablet TAKE 1 TABLET BY MOUTH EVERY DAY 30 tablet 11  . levothyroxine (SYNTHROID) 75 MCG tablet TAKE 1 TABLET BY MOUTH ONCE A DAY ON AN EMPTY STOMACH 30 MINUTES BEFORE EATING. 30 tablet 0  . metFORMIN (GLUCOPHAGE) 1000 MG tablet TAKE 1 TABLET (1,000 MG TOTAL) BY MOUTH 2 (TWO) TIMES DAILY WITH A MEAL. 180 tablet 3  . mometasone (ELOCON) 0.1 % cream Apply 1 application topically daily. To affected areas 30 g 1  . olmesartan (BENICAR) 40 MG tablet TAKE 1 TABLET BY MOUTH EVERY DAY 30 tablet 5  . omeprazole (PRILOSEC) 40 MG capsule TAKE 1 CAPSULE BY MOUTH EVERY DAY 90 capsule 1  . ONETOUCH ULTRA test strip USE TO TEST BLOOD SUGAR 4 TIMES DAILY AS DIRECTED 400 strip 11  . vitamin B-12 (CYANOCOBALAMIN) 1000 MCG tablet Take 1,000 mcg by mouth daily.      . urea (CARMOL) 40 % CREA Add to callus each night. (Patient not taking: Reported on 11/10/2019) 85 g 2   No current facility-administered medications on file prior to visit.      Review of Systems  Constitutional: Negative for activity change, appetite change, fatigue, fever and  unexpected weight change.  HENT: Negative for congestion, rhinorrhea, sore throat and trouble swallowing.   Eyes: Negative for pain, redness, itching and visual disturbance.  Respiratory: Negative for cough, chest tightness, shortness of breath and wheezing.   Cardiovascular: Negative for chest pain and palpitations.  Gastrointestinal: Negative for abdominal pain, blood in stool, constipation, diarrhea and nausea.  Endocrine: Negative for cold intolerance, heat intolerance, polydipsia and polyuria.  Genitourinary: Negative for difficulty urinating, dysuria, frequency and urgency.  Musculoskeletal: Negative for arthralgias, joint swelling and myalgias.  Skin: Positive for wound. Negative  for pallor and rash.       Abscess/cyst in R upper groin area   Neurological: Negative for dizziness, tremors, weakness, numbness and headaches.  Hematological: Negative for adenopathy. Does not bruise/bleed easily.  Psychiatric/Behavioral: Negative for decreased concentration and dysphoric mood. The patient is not nervous/anxious.        Objective:   Physical Exam Constitutional:      General: He is not in acute distress.    Appearance: Normal appearance. He is obese. He is not ill-appearing.  Eyes:     Conjunctiva/sclera: Conjunctivae normal.     Pupils: Pupils are equal, round, and reactive to light.  Cardiovascular:     Rate and Rhythm: Regular rhythm. Tachycardia present.     Comments: Rate of 90 Pulmonary:     Effort: Pulmonary effort is normal. No respiratory distress.  Skin:    General: Skin is warm and dry.     Coloration: Skin is not pale.     Findings: No rash.     Comments: 2-3 cm area of induration and erythema overlying old hernia scar (scar is linear) One 3 mm area of pustule - small amt of pus expressed  Mildly tender   Neurological:     Mental Status: He is alert.     Coordination: Coordination normal.  Psychiatric:        Mood and Affect: Mood normal.             Assessment & Plan:   Problem List Items Addressed This Visit      Musculoskeletal and Integument   Boil of groin    Early- with small amt of fluctuance that expressed pus  In diabetic pt with h/o abscess in the past Adv to continue soap and water cleanse and warm compresses  tx with bactrim ds (to cover mrsa) bid for 7d  inst to call/follow up if worse pain/swelling/red or any systemic symptoms        Relevant Medications   sulfamethoxazole-trimethoprim (BACTRIM DS) 800-160 MG tablet

## 2019-11-10 NOTE — Assessment & Plan Note (Signed)
Early- with small amt of fluctuance that expressed pus  In diabetic pt with h/o abscess in the past Adv to continue soap and water cleanse and warm compresses  tx with bactrim ds (to cover mrsa) bid for 7d  inst to call/follow up if worse pain/swelling/red or any systemic symptoms

## 2019-11-13 ENCOUNTER — Ambulatory Visit (INDEPENDENT_AMBULATORY_CARE_PROVIDER_SITE_OTHER): Payer: 59 | Admitting: Family Medicine

## 2019-11-13 ENCOUNTER — Encounter: Payer: Self-pay | Admitting: Family Medicine

## 2019-11-13 ENCOUNTER — Other Ambulatory Visit: Payer: Self-pay

## 2019-11-13 VITALS — BP 138/80 | HR 86 | Temp 96.9°F | Ht 68.75 in | Wt 233.6 lb

## 2019-11-13 DIAGNOSIS — E1165 Type 2 diabetes mellitus with hyperglycemia: Secondary | ICD-10-CM

## 2019-11-13 DIAGNOSIS — E785 Hyperlipidemia, unspecified: Secondary | ICD-10-CM

## 2019-11-13 DIAGNOSIS — R7989 Other specified abnormal findings of blood chemistry: Secondary | ICD-10-CM | POA: Insufficient documentation

## 2019-11-13 DIAGNOSIS — N183 Chronic kidney disease, stage 3 unspecified: Secondary | ICD-10-CM

## 2019-11-13 DIAGNOSIS — R5382 Chronic fatigue, unspecified: Secondary | ICD-10-CM

## 2019-11-13 DIAGNOSIS — IMO0002 Reserved for concepts with insufficient information to code with codable children: Secondary | ICD-10-CM

## 2019-11-13 DIAGNOSIS — E1122 Type 2 diabetes mellitus with diabetic chronic kidney disease: Secondary | ICD-10-CM | POA: Diagnosis not present

## 2019-11-13 DIAGNOSIS — Z794 Long term (current) use of insulin: Secondary | ICD-10-CM

## 2019-11-13 DIAGNOSIS — Z0000 Encounter for general adult medical examination without abnormal findings: Secondary | ICD-10-CM

## 2019-11-13 DIAGNOSIS — I10 Essential (primary) hypertension: Secondary | ICD-10-CM

## 2019-11-13 DIAGNOSIS — E1169 Type 2 diabetes mellitus with other specified complication: Secondary | ICD-10-CM | POA: Diagnosis not present

## 2019-11-13 DIAGNOSIS — L02224 Furuncle of groin: Secondary | ICD-10-CM

## 2019-11-13 DIAGNOSIS — Z789 Other specified health status: Secondary | ICD-10-CM

## 2019-11-13 DIAGNOSIS — Z6834 Body mass index (BMI) 34.0-34.9, adult: Secondary | ICD-10-CM

## 2019-11-13 DIAGNOSIS — E039 Hypothyroidism, unspecified: Secondary | ICD-10-CM

## 2019-11-13 MED ORDER — OLMESARTAN MEDOXOMIL 40 MG PO TABS: 40.0000 mg | ORAL_TABLET | Freq: Every day | ORAL | 11 refills | Status: DC

## 2019-11-13 MED ORDER — LEVOTHYROXINE SODIUM 75 MCG PO TABS: ORAL_TABLET | ORAL | 11 refills | Status: DC

## 2019-11-13 MED ORDER — OMEPRAZOLE 40 MG PO CPDR: 40.0000 mg | DELAYED_RELEASE_CAPSULE | Freq: Every day | ORAL | 11 refills | Status: DC

## 2019-11-13 MED ORDER — ATORVASTATIN CALCIUM 40 MG PO TABS: 40.0000 mg | ORAL_TABLET | Freq: Every day | ORAL | 11 refills | Status: DC

## 2019-11-13 NOTE — Patient Instructions (Addendum)
Goal- to exercise 5 days per week 30 or more minutes  This will help cholesterol and diabetes and also help energy level   I recommend covid vaccination   Cutting back on alcohol would be good for your health and also help sleep quality and fatigue   Be mindful to watch sleep and snoring - sleep apnea is a possibility  We can get you to a sleep specialist in the future if needed   Testosterone is mildly low - we will keep this in mind with the fatigue   Other labs were stable

## 2019-11-13 NOTE — Progress Notes (Signed)
Subjective:    Patient ID: Mitchell Palmer, male    DOB: [birth year], 54 y.o.   MRN: [id number]  This visit occurred during the SARS-CoV-2 public health emergency.  Safety protocols were in place, including screening questions prior to the visit, additional usage of staff PPE, and extensive cleaning of exam room while observing appropriate contact time as indicated for disinfecting solutions.    HPI Here for health maintenance exam and to review chronic medical problems    Wt Readings from Last 3 Encounters:  11/13/19 233 lb 9 oz (105.9 kg)  11/10/19 235 lb (106.6 kg)  09/08/19 234 lb (106.1 kg)   34.74 kg/m   Was recently seen for skin infection/boil  It is getting better and draining some   Colonoscopy 7/17 with 5 y recall  Father had colon cancer at 90 yo and brother had colon polyps  Eye exam 6/20  Tdap 5/12 pna vaccine 7/18  Flu shot 10/20   He is thinking about a covid vaccine    bp is stable today  No cp or palpitations or headaches or edema  No side effects to medicines  BP Readings from Last 3 Encounters:  11/13/19 140/72  11/10/19 126/74  09/08/19 140/78    Has not had medicine today  Second check BP: 138/80   Sees endocrinology for DM2 Per pt doing well   Hyperlipidemia  Lab Results  Component Value Date   CHOL 104 10/20/2019   CHOL 141 03/17/2019   CHOL 117 09/18/2018   Lab Results  Component Value Date   HDL 40.60 10/20/2019   HDL 42.00 03/17/2019   HDL 35.40 (L) 09/18/2018   Lab Results  Component Value Date   LDLCALC 37 10/20/2019   LDLCALC 51 09/18/2018   LDLCALC 36 10/31/2017   Lab Results  Component Value Date   TRIG 133.0 10/20/2019   TRIG 291.0 (H) 03/17/2019   TRIG 153.0 (H) 09/18/2018   Lab Results  Component Value Date   CHOLHDL 3 10/20/2019   CHOLHDL 3 03/17/2019   CHOLHDL 3 09/18/2018   Lab Results  Component Value Date   LDLDIRECT 63.0 03/17/2019   LDLDIRECT 48.0 09/21/2016   LDLDIRECT 59.0 01/23/2016   atorvastatin and diet  HDL is borderline low  He does not exercise - but doing yard work   Hypothyroidism  Pt has no clinical changes No change in energy level/ hair or skin/ edema and no tremor Lab Results  Component Value Date   TSH 1.85 10/20/2019      Alcohol in take - drinks 6-8 beers on days he does not work  Has not been motivated to cut back or quit Does not feel dependent  Does not black out  Does not crave alcohol   Knows it is a lot of carbs    Lab Results  Component Value Date   ALT 35 10/20/2019   AST 23 10/20/2019   ALKPHOS 81 10/20/2019   BILITOT 0.7 10/20/2019     Lab Results  Component Value Date   VITAMINB12 436 10/20/2019  has been more fatigued   Pt desired check of testosterone  Level of 214 (range is 300 to 890) - not experiencing libido issues  A little decreased motivation   Lipase nl at 27 (pancreatitis in the past)   Lab Results  Component Value Date   WBC 6.6 10/20/2019   HGB 16.1 10/20/2019   HCT 45.8 10/20/2019   MCV 99.3 10/20/2019   PLT 225.0 10/20/2019  Lab Results  Component Value Date   CREATININE 1.14 10/20/2019   BUN 10 10/20/2019   NA 140 10/20/2019   K 4.8 10/20/2019   CL 101 10/20/2019   CO2 30 10/20/2019    Patient Active Problem List   Diagnosis Date Noted  . Low testosterone 11/13/2019  . Boil of groin 11/10/2019  . Fatigue 10/20/2019  . Exposure to COVID-19 virus 12/03/2018  . ETD (eustachian tube dysfunction) 10/16/2018  . Skin rash 08/05/2018  . Neck pain 04/28/2018  . Need for hepatitis B screening test 03/26/2018  . Need for hepatitis C screening test 03/26/2018  . Paresthesia of both feet 11/01/2017  . Hemorrhoids 10/14/2017  . BMI 34.0-34.9,adult 01/24/2016  . Hypothyroidism 01/24/2016  . Uncontrolled type 2 diabetes mellitus with stage 3 chronic kidney disease, with long-term current use of insulin ([place]) 07/28/2015  . Alcohol consumption heavy 12/06/2014  . Necrobiosis diabeticorum ([place])  03/01/2014  . Routine general medical examination at a health care facility 09/30/2013  . Low back pain 06/17/2012  . Abnormal chest x-ray 10/15/2011  . PLANTAR FASCIITIS, TRAUMATIC 11/09/2009  . NEOPLASM UNCERTAIN BEHAVIOR OTHER SPEC SITES 10/07/2009  . ESOPHAGEAL STRICTURE 06/27/2009  . GERD 03/31/2009  . OTHER DYSPHAGIA 03/31/2009  . Hyperlipidemia associated with type 2 diabetes mellitus ([place]) 01/06/2009  . Essential hypertension 01/06/2009  . Allergic rhinitis 01/06/2009   Past Medical History:  Diagnosis Date  . Allergy    allegic rhinitis  . Diabetes mellitus    type II  . Esophageal stricture   . GERD (gastroesophageal reflux disease)   . History of hernia repair    as a baby  . Hyperlipidemia   . Hyperplastic polyp of intestine 2010  . Hypertension   . S/P ear surgery, follow-up exam    for drainage   Past Surgical History:  Procedure Laterality Date  . COLONOSCOPY    . ESOPHAGOGASTRODUODENOSCOPY  04/2009   stricture/GERD  . HERNIA REPAIR     age 46-midline  . [place]   went in behind rt ear-blockage  . POLYPECTOMY    . SHOULDER ARTHROSCOPY WITH ROTATOR CUFF REPAIR AND SUBACROMIAL DECOMPRESSION Right 09/29/2012   Procedure: SHOULDER ARTHROSCOPY WITH ROTATOR CUFF REPAIR AND SUBACROMIAL DECOMPRESSION;  Surgeon: Nita Sells, MD;  Location: [facility];  Service: Orthopedics;  Laterality: Right;  Right shoulder arthroscopy with subacromial decompression and distal clavicle excision & Debridement of Labrial Tear  . UPPER GASTROINTESTINAL ENDOSCOPY     Social History   Tobacco Use  . Smoking status: Never Smoker  . Smokeless tobacco: Never Used  Substance Use Topics  . Alcohol use: Yes    Alcohol/week: 12.0 standard drinks    Types: 12 Standard drinks or equivalent per week    Comment: 6 pk every 3 days; 12 pk/week  . Drug use: No   Family History  Problem Relation Age of Onset  . Diabetes Mother   . Colon cancer  Father 16  . Diabetes Other   . Colon polyps Brother   . Diabetes Maternal Aunt   . Cancer Cousin        breast cancer   Allergies  Allergen Reactions  . Codeine     REACTION: anxious   Current Outpatient Medications on File Prior to Visit  Medication Sig Dispense Refill  . B-D INS SYR ULTRAFINE 1CC/31G 31G X 5/16" 1 ML MISC INJECT 35 UNITS INTO THE SKIN EVERY 12 HOURS AS DIRECTED BY PHYSICIAN. 200 each 2  .  B-D UF III MINI PEN NEEDLES 31G X 5 MM MISC USE AS DIRECTED 4 TIMES A DAY 100 each 8  . CIALIS 20 MG tablet TAKE 1 TABLET (20 MG TOTAL) BY MOUTH AS DIRECTED. 4 tablet 5  . cyclobenzaprine (FLEXERIL) 10 MG tablet Take 1 tablet (10 mg total) by mouth 3 (three) times daily as needed. 30 tablet 3  . Dulaglutide (TRULICITY) 3 0000000 SOPN Inject 3 mg into the skin once a week. 4 pen 11  . fluticasone (FLONASE) 50 MCG/ACT nasal spray SPRAY 2 SPRAYS INTO EACH NOSTRIL EVERY DAY 16 mL 5  . Insulin Glargine (LANTUS SOLOSTAR) 100 UNIT/ML Solostar Pen Inject 35 units in the morning and 35 units in the evening 15 pen 11  . insulin lispro (HUMALOG KWIKPEN) 100 UNIT/ML KwikPen INJECT 15-25 UNITS INTO THE SKIN 3 TIMES DAILY W/ MEALS, 10-12 UNITS INTO THE SKIN W/ SNACKS. 15 mL 1  . JARDIANCE 10 MG TABS tablet TAKE 1 TABLET BY MOUTH EVERY DAY 30 tablet 11  . metFORMIN (GLUCOPHAGE) 1000 MG tablet TAKE 1 TABLET (1,000 MG TOTAL) BY MOUTH 2 (TWO) TIMES DAILY WITH A MEAL. 180 tablet 3  . mometasone (ELOCON) 0.1 % cream Apply 1 application topically daily. To affected areas 30 g 1  . ONETOUCH ULTRA test strip USE TO TEST BLOOD SUGAR 4 TIMES DAILY AS DIRECTED 400 strip 11  . sulfamethoxazole-trimethoprim (BACTRIM DS) 800-160 MG tablet Take 1 tablet by mouth 2 (two) times daily. 14 tablet 0  . urea (CARMOL) 40 % CREA Add to callus each night. 85 g 2  . vitamin B-12 (CYANOCOBALAMIN) 1000 MCG tablet Take 1,000 mcg by mouth daily.       No current facility-administered medications on file prior to visit.     Review of Systems  Constitutional: Positive for fatigue. Negative for activity change, appetite change, fever and unexpected weight change.  HENT: Negative for congestion, rhinorrhea, sore throat and trouble swallowing.   Eyes: Negative for pain, redness, itching and visual disturbance.  Respiratory: Negative for cough, chest tightness, shortness of breath and wheezing.   Cardiovascular: Negative for chest pain and palpitations.  Gastrointestinal: Negative for abdominal pain, blood in stool, constipation, diarrhea and nausea.  Endocrine: Negative for cold intolerance, heat intolerance, polydipsia and polyuria.  Genitourinary: Negative for difficulty urinating, dysuria, frequency and urgency.  Musculoskeletal: Negative for arthralgias, joint swelling and myalgias.  Skin: Negative for pallor and rash.  Neurological: Negative for dizziness, tremors, weakness, numbness and headaches.  Hematological: Negative for adenopathy. Does not bruise/bleed easily.  Psychiatric/Behavioral: Negative for decreased concentration and dysphoric mood. The patient is not nervous/anxious.        Objective:   Physical Exam Constitutional:      General: He is not in acute distress.    Appearance: Normal appearance. He is well-developed. He is obese. He is not ill-appearing or diaphoretic.  HENT:     Head: Normocephalic and atraumatic.     Right Ear: Tympanic membrane, ear canal and external ear normal.     Left Ear: Tympanic membrane, ear canal and external ear normal.     Nose: Nose normal. No congestion.     Mouth/Throat:     Mouth: Mucous membranes are moist.     Pharynx: Oropharynx is clear. No posterior oropharyngeal erythema.  Eyes:     General: No scleral icterus.       Right eye: No discharge.        Left eye: No discharge.  Conjunctiva/sclera: Conjunctivae normal.     Pupils: Pupils are equal, round, and reactive to light.  Neck:     Thyroid: No thyromegaly.     Vascular: No carotid bruit or  JVD.  Cardiovascular:     Rate and Rhythm: Normal rate and regular rhythm.     Pulses: Normal pulses.     Heart sounds: Normal heart sounds. No gallop.   Pulmonary:     Effort: Pulmonary effort is normal. No respiratory distress.     Breath sounds: Normal breath sounds. No wheezing or rales.     Comments: Good air exch Chest:     Chest wall: No tenderness.  Abdominal:     General: Bowel sounds are normal. There is no distension or abdominal bruit.     Palpations: Abdomen is soft. There is no mass.     Tenderness: There is no abdominal tenderness.     Hernia: No hernia is present.  Musculoskeletal:        General: No tenderness.     Cervical back: Normal range of motion and neck supple. No rigidity. No muscular tenderness.     Right lower leg: No edema.     Left lower leg: No edema.  Lymphadenopathy:     Cervical: No cervical adenopathy.  Skin:    General: Skin is warm and dry.     Coloration: Skin is not pale.     Findings: No erythema or rash.     Comments: Wound near R groin is improved/less red and smaller with evidence of recent drainage  Neurological:     Mental Status: He is alert.     Cranial Nerves: No cranial nerve deficit.     Motor: No abnormal muscle tone.     Coordination: Coordination normal.     Gait: Gait normal.     Deep Tendon Reflexes: Reflexes are normal and symmetric. Reflexes normal.  Psychiatric:        Mood and Affect: Mood normal.        Cognition and Memory: Cognition and memory normal.           Assessment & Plan:   Problem List Items Addressed This Visit      Cardiovascular and Mediastinum   Essential hypertension    bp in fair control at this time  BP Readings from Last 1 Encounters:  11/13/19 138/80   No changes needed Most recent labs reviewed  Disc lifstyle change with low sodium diet and exercise        Relevant Medications   atorvastatin (LIPITOR) 40 MG tablet   olmesartan (BENICAR) 40 MG tablet     Endocrine    Hyperlipidemia associated with type 2 diabetes mellitus ([place])    Disc goals for lipids and reasons to control them Rev last labs with pt Rev low sat fat diet in detail Good LDL control with atorvastatin  Disc need for exercise to inc HDL         Relevant Medications   atorvastatin (LIPITOR) 40 MG tablet   olmesartan (BENICAR) 40 MG tablet   Uncontrolled type 2 diabetes mellitus with stage 3 chronic kidney disease, with long-term current use of insulin ([facility])    Continues under care of endocrinology        Relevant Medications   atorvastatin (LIPITOR) 40 MG tablet   olmesartan (BENICAR) 40 MG tablet   Hypothyroidism    Hypothyroidism  Pt has no clinical changes No change in energy level/ hair or skin/ edema and  no tremor Lab Results  Component Value Date   TSH 1.85 10/20/2019          Relevant Medications   levothyroxine (SYNTHROID) 75 MCG tablet     Musculoskeletal and Integument   Boil of groin    Much improved with bactrim DS tx  Will continue to monitor        Other   Routine general medical examination at a health care facility - Primary    Reviewed health habits including diet and exercise and skin cancer prevention Reviewed appropriate screening tests for age  Also reviewed health mt list, fam hx and immunization status , as well as social and family history   See HPI  Labs reviewed  Counseled re: etoh intake  Labs reviewed  Enc a covid vaccine        Alcohol consumption heavy    Again counseled re: cutting/quitting etoh for DM and general healthy  He denies dependence  LFTs are not elevated  Nl lipase      BMI 34.0-34.9,adult    Discussed how this problem influences overall health and the risks it imposes  Reviewed plan for weight loss with lower calorie diet (via better food choices and also portion control or program like weight watchers) and exercise building up to or more than 30 minutes 5 days per week including some aerobic activity          Fatigue    Generalized/chronic in medically complex patient  Enc cutting etoh and inc exercise  B12 level nl  Testosterone is low  Poss high risk for sleep apnea - given info and enc him to disc eval /monitor sleep and snoring       Low testosterone    Level of 214 this check  Some fatigue but no libido issues  Offered urology ref in the future if interested in further eval/tx

## 2019-11-15 NOTE — Assessment & Plan Note (Signed)
Continues under care of endocrinology 

## 2019-11-15 NOTE — Assessment & Plan Note (Signed)
Generalized/chronic in medically complex patient  Enc cutting etoh and inc exercise  B12 level nl  Testosterone is low  Poss high risk for sleep apnea - given info and enc him to disc eval /monitor sleep and snoring

## 2019-11-15 NOTE — Assessment & Plan Note (Signed)
bp in fair control at this time  BP Readings from Last 1 Encounters:  11/13/19 138/80   No changes needed Most recent labs reviewed  Disc lifstyle change with low sodium diet and exercise

## 2019-11-15 NOTE — Assessment & Plan Note (Signed)
Reviewed health habits including diet and exercise and skin cancer prevention Reviewed appropriate screening tests for age  Also reviewed health mt list, fam hx and immunization status , as well as social and family history   See HPI  Labs reviewed  Counseled re: etoh intake  Labs reviewed  Enc a covid vaccine

## 2019-11-15 NOTE — Assessment & Plan Note (Signed)
Hypothyroidism  Pt has no clinical changes No change in energy level/ hair or skin/ edema and no tremor Lab Results  Component Value Date   TSH 1.85 10/20/2019

## 2019-11-15 NOTE — Assessment & Plan Note (Signed)
Disc goals for lipids and reasons to control them Rev last labs with pt Rev low sat fat diet in detail Good LDL control with atorvastatin  Disc need for exercise to inc HDL

## 2019-11-15 NOTE — Assessment & Plan Note (Signed)
Discussed how this problem influences overall health and the risks it imposes  Reviewed plan for weight loss with lower calorie diet (via better food choices and also portion control or program like weight watchers) and exercise building up to or more than 30 minutes 5 days per week including some aerobic activity    

## 2019-11-15 NOTE — Assessment & Plan Note (Signed)
Level of 214 this check  Some fatigue but no libido issues  Offered urology ref in the future if interested in further eval/tx

## 2019-11-15 NOTE — Assessment & Plan Note (Signed)
Much improved with bactrim DS tx  Will continue to monitor

## 2019-11-15 NOTE — Assessment & Plan Note (Signed)
Again counseled re: cutting/quitting etoh for DM and general healthy  He denies dependence  LFTs are not elevated  Nl lipase

## 2019-11-25 ENCOUNTER — Other Ambulatory Visit: Payer: Self-pay | Admitting: Internal Medicine

## 2019-12-19 ENCOUNTER — Other Ambulatory Visit: Payer: Self-pay | Admitting: Internal Medicine

## 2019-12-24 ENCOUNTER — Ambulatory Visit (INDEPENDENT_AMBULATORY_CARE_PROVIDER_SITE_OTHER): Payer: 59 | Admitting: Ophthalmology

## 2019-12-24 ENCOUNTER — Other Ambulatory Visit: Payer: Self-pay

## 2019-12-24 ENCOUNTER — Encounter (INDEPENDENT_AMBULATORY_CARE_PROVIDER_SITE_OTHER): Payer: Self-pay | Admitting: Ophthalmology

## 2019-12-24 DIAGNOSIS — E113511 Type 2 diabetes mellitus with proliferative diabetic retinopathy with macular edema, right eye: Secondary | ICD-10-CM | POA: Insufficient documentation

## 2019-12-24 DIAGNOSIS — E113551 Type 2 diabetes mellitus with stable proliferative diabetic retinopathy, right eye: Secondary | ICD-10-CM

## 2019-12-24 DIAGNOSIS — E113552 Type 2 diabetes mellitus with stable proliferative diabetic retinopathy, left eye: Secondary | ICD-10-CM

## 2019-12-24 DIAGNOSIS — H2512 Age-related nuclear cataract, left eye: Secondary | ICD-10-CM | POA: Diagnosis not present

## 2019-12-24 DIAGNOSIS — H4311 Vitreous hemorrhage, right eye: Secondary | ICD-10-CM | POA: Insufficient documentation

## 2019-12-24 DIAGNOSIS — E669 Obesity, unspecified: Secondary | ICD-10-CM | POA: Insufficient documentation

## 2019-12-24 DIAGNOSIS — E113592 Type 2 diabetes mellitus with proliferative diabetic retinopathy without macular edema, left eye: Secondary | ICD-10-CM | POA: Insufficient documentation

## 2019-12-24 HISTORY — DX: Vitreous hemorrhage, right eye: H43.11

## 2019-12-24 NOTE — Progress Notes (Signed)
12/24/2019     CHIEF COMPLAINT Patient presents for Retina Follow Up   HISTORY OF PRESENT ILLNESS: Mitchell Palmer is a 54 y.o. male who presents to the clinic today for:   HPI    Retina Follow Up    Patient presents with  Diabetic Retinopathy.  In both eyes.  Duration of 5 months.  Since onset it is stable.          Comments    5 month follow up  - FP OU Patient denies change in vision and overall has no complaints. LBS 165 this AM A1C 7.26 Aug 2019       Last edited by Gerda Diss on 12/24/2019 10:42 AM. (History)      Referring physician: Tower, Wynelle Fanny, MD [place],  [place] [id number]  HISTORICAL INFORMATION:   Selected notes from the MEDICAL RECORD NUMBER    Lab Results  Component Value Date   HGBA1C 7.5 (A) 09/08/2019     CURRENT MEDICATIONS: No current outpatient medications on file. (Ophthalmic Drugs)   No current facility-administered medications for this visit. (Ophthalmic Drugs)   Current Outpatient Medications (Other)  Medication Sig  . atorvastatin (LIPITOR) 40 MG tablet Take 1 tablet (40 mg total) by mouth daily.  . B-D INS SYR ULTRAFINE 1CC/31G 31G X 5/16" 1 ML MISC INJECT 35 UNITS INTO THE SKIN EVERY 12 HOURS AS DIRECTED BY PHYSICIAN.  [name] B-D UF III MINI PEN NEEDLES 31G X 5 MM MISC USE AS DIRECTED 4 TIMES A DAY  . CIALIS 20 MG tablet TAKE 1 TABLET (20 MG TOTAL) BY MOUTH AS DIRECTED.  [name] cyclobenzaprine (FLEXERIL) 10 MG tablet Take 1 tablet (10 mg total) by mouth 3 (three) times daily as needed.  . Dulaglutide (TRULICITY) 3 0000000 SOPN Inject 3 mg into the skin once a week.  . fluticasone (FLONASE) 50 MCG/ACT nasal spray SPRAY 2 SPRAYS INTO EACH NOSTRIL EVERY DAY  . Insulin Glargine (LANTUS SOLOSTAR) 100 UNIT/ML Solostar Pen Inject 35 units in the morning and 35 units in the evening  . insulin lispro (HUMALOG KWIKPEN) 100 UNIT/ML KwikPen INJECT 15-25 UNITS INTO THE SKIN 3 TIMES DAILY W/ MEALS, 10-12 UNITS INTO THE SKIN W/ SNACKS    . JARDIANCE 10 MG TABS tablet TAKE 1 TABLET BY MOUTH EVERY DAY  . levothyroxine (SYNTHROID) 75 MCG tablet TAKE 1 TABLET BY MOUTH ONCE A DAY ON AN EMPTY STOMACH 30 MINUTES BEFORE EATING.  . metFORMIN (GLUCOPHAGE) 1000 MG tablet TAKE 1 TABLET (1,000 MG TOTAL) BY MOUTH 2 (TWO) TIMES DAILY WITH A MEAL.  . mometasone (ELOCON) 0.1 % cream Apply 1 application topically daily. To affected areas  . olmesartan (BENICAR) 40 MG tablet Take 1 tablet (40 mg total) by mouth daily.  [name] omeprazole (PRILOSEC) 40 MG capsule Take 1 capsule (40 mg total) by mouth daily.  Glory Rosebush ULTRA test strip USE TO TEST BLOOD SUGAR 4 TIMES DAILY AS DIRECTED  . sulfamethoxazole-trimethoprim (BACTRIM DS) 800-160 MG tablet Take 1 tablet by mouth 2 (two) times daily.  . urea (CARMOL) 40 % CREA Add to callus each night.  . vitamin B-12 (CYANOCOBALAMIN) 1000 MCG tablet Take 1,000 mcg by mouth daily.     No current facility-administered medications for this visit. (Other)      REVIEW OF SYSTEMS:    ALLERGIES Allergies  Allergen Reactions  . Codeine     REACTION: anxious    PAST MEDICAL HISTORY Past Medical History:  Diagnosis Date  .  Allergy    allegic rhinitis  . Diabetes mellitus    type II  . Esophageal stricture   . GERD (gastroesophageal reflux disease)   . History of hernia repair    as a baby  . Hyperlipidemia   . Hyperplastic polyp of intestine 2010  . Hypertension   . S/P ear surgery, follow-up exam    for drainage   Past Surgical History:  Procedure Laterality Date  . COLONOSCOPY    . ESOPHAGOGASTRODUODENOSCOPY  04/2009   stricture/GERD  . HERNIA REPAIR     age 33-midline  . [place]   went in behind rt ear-blockage  . POLYPECTOMY    . SHOULDER ARTHROSCOPY WITH ROTATOR CUFF REPAIR AND SUBACROMIAL DECOMPRESSION Right 09/29/2012   Procedure: SHOULDER ARTHROSCOPY WITH ROTATOR CUFF REPAIR AND SUBACROMIAL DECOMPRESSION;  Surgeon: Nita Sells, MD;  Location: [place];  Service: Orthopedics;  Laterality: Right;  Right shoulder arthroscopy with subacromial decompression and distal clavicle excision & Debridement of Labrial Tear  . UPPER GASTROINTESTINAL ENDOSCOPY      FAMILY HISTORY Family History  Problem Relation Age of Onset  . Diabetes Mother   . Colon cancer Father 52  . Diabetes Other   . Colon polyps Brother   . Diabetes Maternal Aunt   . Cancer Cousin        breast cancer    SOCIAL HISTORY Social History   Tobacco Use  . Smoking status: Never Smoker  . Smokeless tobacco: Never Used  Substance Use Topics  . Alcohol use: Yes    Alcohol/week: 12.0 standard drinks    Types: 12 Standard drinks or equivalent per week    Comment: 6 pk every 3 days; 12 pk/week  . Drug use: No         OPHTHALMIC EXAM:  Base Eye Exam    Visual Acuity (Snellen - Linear)      Right Left   Dist cc 20/20-2 20/20   Correction: Glasses       Tonometry (Tonopen, 10:47 AM)      Right Left   Pressure 13 9       Pupils      Pupils Dark Light Shape React APD   Right PERRL 3 3 Round Minimal None   Left PERRL 3 3 Round Minimal None       Visual Fields (Counting fingers)      Left Right    Full    Restrictions  Partial outer superior temporal, inferior nasal deficiencies       Extraocular Movement      Right Left    Full Full       Neuro/Psych    Oriented x3: Yes   Mood/Affect: Normal       Dilation    Both eyes: 1.0% Mydriacyl, 2.5% Phenylephrine @ 10:47 AM        Slit Lamp and Fundus Exam    External Exam      Right Left   External Normal Normal       Slit Lamp Exam      Right Left   Lids/Lashes Normal Normal   Conjunctiva/Sclera White and quiet White and quiet   Cornea Clear Clear   Anterior Chamber Deep and quiet Deep and quiet   Iris Round and reactive Round and reactive   Lens Posterior chamber intraocular lens,, PC is clear 2+ Nuclear sclerosis   Anterior Vitreous Normal Normal       Fundus Exam  Right Left   Posterior Vitreous Vitrectomized Normal   Disc Normal Normal   C/D Ratio 0.15 0.2   Macula no macular thickening, no exudates, Microaneurysms, Focal laser scars, no clinically significant macular edema Microaneurysms, no macular thickening, no clinically significant macular edema   Vessels Proliferative diabetic retinopathy quiesced since Proliferative diabetic retinopathy quiesced since   Periphery Good PRP and attached Good PRP and attached, some room anterior in the temporal retina for more PRP if disease reactivates          IMAGING AND PROCEDURES  Imaging and Procedures for 12/24/19  Color Fundus Photography Optos - OU - Both Eyes       Right Eye Progression has improved. Disc findings include normal observations. Macula : microaneurysms.   Left Eye Progression has improved. Disc findings include normal observations. Macula : microaneurysms.   Notes OD, proliferative diabetic retinopathy quiesced sent with good PRP, OS the same.  Media clear OU.                ASSESSMENT/PLAN:  No problem-specific Assessment & Plan notes found for this encounter.      ICD-10-CM   1. Stable treated proliferative diabetic retinopathy of right eye determined by examination associated with type 2 diabetes mellitus ([place])  [id number] Color Fundus Photography Optos - OU - Both Eyes  2. Stable treated proliferative diabetic retinopathy of left eye determined by examination associated with type 2 diabetes mellitus ([place])  [id number] Color Fundus Photography Optos - OU - Both Eyes  3. Nuclear sclerotic cataract of left eye  H25.12 Color Fundus Photography Optos - OU - Both Eyes    1.  OU, stable quiescent proliferative diabetic retinopathy.  I will monitor the anterior segment in the posterior segment findings in the left eye more carefully because this patient in the past has had iris neovascularization.  2.  Patient has moderate complaints of a surface type issue where feels like  an ocular surface issue, this is vague and periodically present.  No effect on visual acuity.  3.  Mild nuclear sclerotic cataract left eye stable  4.  Patient is due for follow-up with Dr. Warden Fillers.   Ophthalmic Meds Ordered this visit:  No orders of the defined types were placed in this encounter.      Return in about 3 months (around 03/25/2020) for OCT, DILATE OU.  Patient Instructions  Encourage patient to follow-up with Dr. Warden Fillers    Explained the diagnoses, plan, and follow up with the patient and they expressed understanding.  Patient expressed understanding of the importance of proper follow up care.   Clent Demark Shivon Hackel M.D. Diseases & Surgery of the Retina and Vitreous [facility]ina [facility] Diabetic [place] 12/24/19     Abbreviations: M myopia (nearsighted); A astigmatism; H hyperopia (farsighted); P presbyopia; Mrx spectacle prescription;  CTL contact lenses; OD right eye; OS left eye; OU both eyes  XT exotropia; ET esotropia; PEK punctate epithelial keratitis; PEE punctate epithelial erosions; DES dry eye syndrome; MGD meibomian gland dysfunction; ATs artificial tears; PFAT's preservative free artificial tears; [place] nuclear sclerotic cataract; PSC posterior subcapsular cataract; ERM epi-retinal membrane; PVD posterior vitreous detachment; RD retinal detachment; DM diabetes mellitus; DR diabetic retinopathy; NPDR non-proliferative diabetic retinopathy; PDR proliferative diabetic retinopathy; CSME clinically significant macular edema; DME diabetic macular edema; dbh dot blot hemorrhages; CWS cotton wool spot; POAG primary open angle glaucoma; C/D cup-to-disc ratio; HVF humphrey visual field; GVF goldmann visual field; OCT optical coherence tomography; IOP intraocular pressure; BRVO  Branch retinal vein occlusion; CRVO central retinal vein occlusion; CRAO central retinal artery occlusion; BRAO branch retinal artery occlusion; RT retinal tear; SB scleral buckle; PPV pars  plana vitrectomy; VH Vitreous hemorrhage; PRP panretinal laser photocoagulation; IVK intravitreal kenalog; VMT vitreomacular traction; MH Macular hole;  NVD neovascularization of the disc; NVE neovascularization elsewhere; AREDS age related eye disease study; ARMD age related macular degeneration; POAG primary open angle glaucoma; EBMD epithelial/anterior basement membrane dystrophy; ACIOL anterior chamber intraocular lens; IOL intraocular lens; PCIOL posterior chamber intraocular lens; Phaco/IOL phacoemulsification with intraocular lens placement; [place] photorefractive keratectomy; LASIK laser assisted in situ keratomileusis; HTN hypertension; DM diabetes mellitus; COPD chronic obstructive pulmonary disease

## 2019-12-24 NOTE — Patient Instructions (Signed)
Encourage patient to follow-up with Dr. Warden Fillers

## 2020-01-07 ENCOUNTER — Encounter: Payer: Self-pay | Admitting: Internal Medicine

## 2020-01-07 ENCOUNTER — Other Ambulatory Visit: Payer: Self-pay

## 2020-01-07 ENCOUNTER — Ambulatory Visit (INDEPENDENT_AMBULATORY_CARE_PROVIDER_SITE_OTHER): Payer: 59 | Admitting: Internal Medicine

## 2020-01-07 VITALS — BP 130/70 | HR 66 | Ht 68.75 in | Wt 229.0 lb

## 2020-01-07 DIAGNOSIS — E1165 Type 2 diabetes mellitus with hyperglycemia: Secondary | ICD-10-CM | POA: Diagnosis not present

## 2020-01-07 DIAGNOSIS — E1122 Type 2 diabetes mellitus with diabetic chronic kidney disease: Secondary | ICD-10-CM

## 2020-01-07 DIAGNOSIS — E7800 Pure hypercholesterolemia, unspecified: Secondary | ICD-10-CM | POA: Diagnosis not present

## 2020-01-07 DIAGNOSIS — E039 Hypothyroidism, unspecified: Secondary | ICD-10-CM | POA: Diagnosis not present

## 2020-01-07 DIAGNOSIS — Z794 Long term (current) use of insulin: Secondary | ICD-10-CM | POA: Diagnosis not present

## 2020-01-07 DIAGNOSIS — IMO0002 Reserved for concepts with insufficient information to code with codable children: Secondary | ICD-10-CM

## 2020-01-07 DIAGNOSIS — N183 Chronic kidney disease, stage 3 unspecified: Secondary | ICD-10-CM

## 2020-01-07 LAB — POCT GLYCOSYLATED HEMOGLOBIN (HGB A1C): Hemoglobin A1C: 7.5 % — AB (ref 4.0–5.6)

## 2020-01-07 MED ORDER — LANTUS SOLOSTAR 100 UNIT/ML ~~LOC~~ SOPN: PEN_INJECTOR | SUBCUTANEOUS | 3 refills | Status: DC

## 2020-01-07 NOTE — Progress Notes (Signed)
Subjective:    This visit occurred during the SARS-CoV-2 public health emergency.  Safety protocols were in place, including screening questions prior to the visit, additional usage of staff PPE, and extensive cleaning of exam room while observing appropriate contact time as indicated for disinfecting solutions.    Patient ID: Mitchell Palmer, male   DOB: 01-31-1966, 54 y.o.   MRN: [id number]  HPI Mr. Mitchell Palmer is a 54 y.o. man, returning for followup for DM2, dx in 2000, uncontrolled, insulin-dependent, with complications (CKD stage 3, erectile dysfunction, mild DR). Last visit 4 months ago.  Reviewed his HbA1c levels: Lab Results  Component Value Date   HGBA1C 7.5 (A) 09/08/2019   HGBA1C 7.1 (A) 04/16/2019   HGBA1C 7.6 (A) 09/03/2018   HGBA1C 8.2 (A) 05/21/2018   HGBA1C 7.4 (A) 01/27/2018   HGBA1C 7.6 10/21/2017   HGBA1C 8.0 05/10/2017   HGBA1C 7.4 11/15/2016   HGBA1C 7.6 08/15/2016   HGBA1C 7.7 05/15/2016   HGBA1C 7.8 02/15/2016   HGBA1C 7.7 11/08/2015   HGBA1C 8.0 (H) 07/22/2015   HGBA1C 8.9 (H) 12/06/2014   HGBA1C 9.1 (H) 03/01/2014   HGBA1C 8.9 (H) 09/30/2013   HGBA1C 9.5 (H) 05/26/2013   HGBA1C 9.9 (H) 01/28/2013   HGBA1C 10.0 (H) 10/14/2012   He is on: - Metformin 1000 mg 2x a day with meals - Jardiance 10 mg before breakfast - added [id number] - Trulicity 1.5 >> 3 mg weekly  - Basaglar >> Lantus 35 mg 2x a day - Humalog 20-25 (30 with steroids) units before each meal  He was on: - he stopped  Invokana 08/2016 as he had leg tingling - Cycloset - started 04/22/2013 >> at 1.6 mg daily (tried 3 tabs >> dizziness >> backed off to 2) - Victoza 1.8 mg daily - Actos 45 mg daily - Januvia - glyburide/metformin - Avandia. - Bydureon 2 mg weekly - he hated the thick needle   He is checking his sugars 2-4 times a day: - before b'fast: 160s >> 130 >> 80s, 120-195, 220 (bedtime)  >> 115-130 - 2 h after b'fast:  79-85>> 181-215 >> n/c - before lunch: 200-220s (20 units) >>  73-166 >> n/c>> 111-140 >> 140-160 - 2 h after lunch:  130-145 >> 171-184 >> 230 >> n/c - Before dinner:170-180 >> 79, 105-160 >> (4 pm): 120-155 >> 135-150 - nighttime: at work280-290 >> 150-180 >> 190 after snack >> 110-125 Lowest: 83 >> 79 >> 80s >> 89;  He has hypoglycemia awareness in the 80s. Highest 483 (in the hospital) >> 310 >> 450 (steroids) >> 290 (pizza).  He works nights: 6 PM to 7:30 AM 3.5 days a week.  Now burning or not any  Meals: - 5:30-6 pm (at work): subway sandwich >> 2 hot dogs + doritos >> chicken biscuit - snack at 9-10 pm: pistachios or 1/2 sandwich; pizza  >> 12 am: nabs +peanuts - 11 am-12 pm: salad, meat + veggies + rice (leftovers from take out), pizza rarely >> pizza  Trying to eat an apple a day.  -+ HL. Last lipid panel: Lab Results  Component Value Date   CHOL 104 10/20/2019   HDL 40.60 10/20/2019   LDLCALC 37 10/20/2019   LDLDIRECT 63.0 03/17/2019   TRIG 133.0 10/20/2019   CHOLHDL 3 10/20/2019  On Lipitor.  -+ Stage II CKD, last BUN/Cr:  Lab Results  Component Value Date   BUN 10 10/20/2019   CREATININE 1.14 10/20/2019   Lab Results  Component Value Date  MICRALBCREAT 0.9 02/15/2016   MICRALBCREAT 1.0 12/07/2014   MICRALBCREAT 0.5 11/04/2012  On olmesartan.  - last eye exam was 06/2019: + DR.  Dr. Katy Fitch >> now Dr. Zadie Rhine.  He had laser surgery OU.  Also, In 07/2018 >> had EMG surgery in his R eye for bleeding. Since last visit, he had cataract sx (Dr. Katy Fitch), another surgery (Dr. Zadie Rhine) on R eye.  - no numbness or tingling in his legs  He also has a history of HTN, GERD, esophageal stricture, plantar fasciitis, eustachian tube dysfunction - status post ear surgery.  Hypothyroidism: -He started levothyroxine 01/2016. -He was skipping doses in the past and TFTs were fluctuating.  He also skipped doses at the end of last year TSH elevated in 08/2018.  Pt is on levothyroxine 75 mcg daily, taken: - in am - fasting - at least 30 min  from b'fast - no Ca, Fe, MVI, PPIs - not on Biotin  Reviewed TFTs: Lab Results  Component Value Date   TSH 1.85 10/20/2019   TSH 2.95 10/21/2018   TSH 5.18 (H) 09/18/2018   TSH 4.25 10/31/2017   TSH 1.17 09/21/2016   TSH 3.05 04/24/2016   TSH 4.97 (H) 03/06/2016   TSH 8.97 (H) 01/23/2016   TSH 7.07 (H) 12/06/2014   TSH 2.18 09/30/2013   At the end of 07/2018 >> had C spine surgery.  Review of Systems Constitutional: no weight gain/no weight loss, no fatigue, no subjective hyperthermia, no subjective hypothermia Eyes: no blurry vision, no xerophthalmia ENT: no sore throat, no nodules palpated in neck, no dysphagia, no odynophagia, no hoarseness Cardiovascular: no CP/no SOB/no palpitations/no leg swelling Respiratory: no cough/no SOB/no wheezing Gastrointestinal: no N/no V/no D/no C/no acid reflux Musculoskeletal: no muscle aches/no joint aches Skin: no rashes, no hair loss Neurological: no tremors/no numbness/no tingling/no dizziness  I reviewed pt's medications, allergies, PMH, social hx, family hx, and changes were documented in the history of present illness. Otherwise, unchanged from my initial visit note.   Past Medical History:  Diagnosis Date  . Allergy    allegic rhinitis  . Diabetes mellitus    type II  . Esophageal stricture   . GERD (gastroesophageal reflux disease)   . History of hernia repair    as a baby  . Hyperlipidemia   . Hyperplastic polyp of intestine 2010  . Hypertension   . S/P ear surgery, follow-up exam    for drainage   Past Surgical History:  Procedure Laterality Date  . COLONOSCOPY    . ESOPHAGOGASTRODUODENOSCOPY  04/2009   stricture/GERD  . HERNIA REPAIR     age 51-midline  . [place]   went in behind rt ear-blockage  . POLYPECTOMY    . SHOULDER ARTHROSCOPY WITH ROTATOR CUFF REPAIR AND SUBACROMIAL DECOMPRESSION Right 09/29/2012   Procedure: SHOULDER ARTHROSCOPY WITH ROTATOR CUFF REPAIR AND SUBACROMIAL DECOMPRESSION;   Surgeon: Nita Sells, MD;  Location: [facility];  Service: Orthopedics;  Laterality: Right;  Right shoulder arthroscopy with subacromial decompression and distal clavicle excision & Debridement of Labrial Tear  . UPPER GASTROINTESTINAL ENDOSCOPY     Social History   Socioeconomic History  . Marital status: Single    Spouse name: Not on file  . Number of children: Not on file  . Years of education: Not on file  . Highest education level: Not on file  Occupational History  . Occupation: Mining engineer  Tobacco Use  . Smoking status: Never Smoker  . Smokeless  tobacco: Never Used  Substance and Sexual Activity  . Alcohol use: Yes    Alcohol/week: 12.0 standard drinks    Types: 12 Standard drinks or equivalent per week    Comment: 6 pk every 3 days; 12 pk/week  . Drug use: No  . Sexual activity: Not on file  Other Topics Concern  . Not on file  Social History Narrative  . Not on file   Social Determinants of Health   Financial Resource Strain:   . Difficulty of Paying Living Expenses:   Food Insecurity:   . Worried About Charity fundraiser in the Last Year:   . Arboriculturist in the Last Year:   Transportation Needs:   . Film/video editor (Medical):   [name] Lack of Transportation (Non-Medical):   Physical Activity:   . Days of Exercise per Week:   . Minutes of Exercise per Session:   Stress:   . Feeling of Stress :   Social Connections:   . Frequency of Communication with Friends and Family:   . Frequency of Social Gatherings with Friends and Family:   . Attends Religious Services:   . Active Member of Clubs or Organizations:   . Attends Archivist Meetings:   [name] Marital Status:   Intimate Partner Violence:   . Fear of Current or Ex-Partner:   . Emotionally Abused:   [name] Physically Abused:   . Sexually Abused:    Current Outpatient Medications on File Prior to Visit  Medication Sig Dispense Refill  . atorvastatin (LIPITOR) 40 MG  tablet Take 1 tablet (40 mg total) by mouth daily. 30 tablet 11  . B-D INS SYR ULTRAFINE 1CC/31G 31G X 5/16" 1 ML MISC INJECT 35 UNITS INTO THE SKIN EVERY 12 HOURS AS DIRECTED BY PHYSICIAN. 200 each 2  . B-D UF III MINI PEN NEEDLES 31G X 5 MM MISC USE AS DIRECTED 4 TIMES A DAY 100 each 8  . CIALIS 20 MG tablet TAKE 1 TABLET (20 MG TOTAL) BY MOUTH AS DIRECTED. 4 tablet 5  . cyclobenzaprine (FLEXERIL) 10 MG tablet Take 1 tablet (10 mg total) by mouth 3 (three) times daily as needed. 30 tablet 3  . Dulaglutide (TRULICITY) 3 0000000 SOPN Inject 3 mg into the skin once a week. 4 pen 11  . fluticasone (FLONASE) 50 MCG/ACT nasal spray SPRAY 2 SPRAYS INTO EACH NOSTRIL EVERY DAY 16 mL 5  . Insulin Glargine (LANTUS SOLOSTAR) 100 UNIT/ML Solostar Pen Inject 35 units in the morning and 35 units in the evening 15 pen 11  . insulin lispro (HUMALOG KWIKPEN) 100 UNIT/ML KwikPen INJECT 15-25 UNITS INTO THE SKIN 3 TIMES DAILY W/ MEALS, 10-12 UNITS INTO THE SKIN W/ SNACKS 15 mL 1  . JARDIANCE 10 MG TABS tablet TAKE 1 TABLET BY MOUTH EVERY DAY 30 tablet 11  . levothyroxine (SYNTHROID) 75 MCG tablet TAKE 1 TABLET BY MOUTH ONCE A DAY ON AN EMPTY STOMACH 30 MINUTES BEFORE EATING. 30 tablet 11  . metFORMIN (GLUCOPHAGE) 1000 MG tablet TAKE 1 TABLET (1,000 MG TOTAL) BY MOUTH 2 (TWO) TIMES DAILY WITH A MEAL. 180 tablet 3  . mometasone (ELOCON) 0.1 % cream Apply 1 application topically daily. To affected areas 30 g 1  . olmesartan (BENICAR) 40 MG tablet Take 1 tablet (40 mg total) by mouth daily. 30 tablet 11  . omeprazole (PRILOSEC) 40 MG capsule Take 1 capsule (40 mg total) by mouth daily. 30 capsule 11  . ONETOUCH ULTRA  test strip USE TO TEST BLOOD SUGAR 4 TIMES DAILY AS DIRECTED 400 strip 11  . sulfamethoxazole-trimethoprim (BACTRIM DS) 800-160 MG tablet Take 1 tablet by mouth 2 (two) times daily. 14 tablet 0  . urea (CARMOL) 40 % CREA Add to callus each night. 85 g 2  . vitamin B-12 (CYANOCOBALAMIN) 1000 MCG tablet Take  1,000 mcg by mouth daily.       No current facility-administered medications on file prior to visit.   Allergies  Allergen Reactions  . Codeine     REACTION: anxious   Family History  Problem Relation Age of Onset  . Diabetes Mother   . Colon cancer Father 40  . Diabetes Other   . Colon polyps Brother   . Diabetes Maternal Aunt   . Cancer Cousin        breast cancer     Objective:   Physical Exam There were no vitals taken for this visit. There is no height or weight on file to calculate BMI.   Wt Readings from Last 3 Encounters:  11/13/19 233 lb 9 oz (105.9 kg)  11/10/19 235 lb (106.6 kg)  09/08/19 234 lb (106.1 kg)   Constitutional: overweight, in NAD Eyes: PERRLA, EOMI, no exophthalmos ENT: moist mucous membranes, no thyromegaly, no cervical lymphadenopathy Cardiovascular: RRR, No MRG Respiratory: CTA B Gastrointestinal: abdomen soft, NT, ND, BS+ Musculoskeletal: no deformities, strength intact in all 4 Skin: moist, warm, no rashes Neurological: no tremor with outstretched hands, DTR normal in all 4  Assessment:     1. 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 HL  3. Hypothyroidism    Plan:     Patient with longstanding, uncontrolled, type 2 diabetes, on a complex medication regimen including basal-bolus insulin, weekly GLP-1 receptor agonist, Metformin, and SGLT 2 inhibitor, with still suboptimal control.  His control improved initially after improving his diet and not drinking as much alcohol.  At last visit: HbA1c was higher, at 7.5% after sugars were higher in the setting of steroid use and also over the holidays.  At that time, we increased his Trulicity dose.  He tolerates the higher dose well.  Of note, Trulicity is free for him. -At this visit, sugars are at goal in the morning but increase after breakfast.  Upon questioning, he is eating fast food in the morning.  We  discussed that he either eliminates anything fried or he increases the amount of Humalog that he takes before this meal.  Later in the day, sugars appear to be at goal so for now, we will continue the rest of the regimen. - I advised him to: Patient Instructions  Please continue: - Metformin 1000 mg 2x a day with meals - Jardiance 10 mg before breakfast - Lantus 35 mg 2x a day - Humalog 20-25 units before each meal (use 25 units in am) - Trulicity 3 mg weekly   Make sure you are not drinking any sodas.  Please return in 4 months with your sugar log.   - we checked his HbA1c: 7.5% (stable) - advised to check sugars at different times of the day - 3x a day, rotating check times - advised for yearly eye exams >> he is UTD - return to clinic in 4 months  2. HL  -Reviewed latest lipid panel from 10/2019, excellent: Lab Results  Component Value Date   CHOL 104 10/20/2019   HDL 40.60 10/20/2019  LDLCALC 37 10/20/2019   LDLDIRECT 63.0 03/17/2019   TRIG 133.0 10/20/2019   CHOLHDL 3 10/20/2019  -Continues Lipitor without side effects  3. Hypothyroidism -Latest thyroid tests reviewed with patient: Normal Lab Results  Component Value Date   TSH 1.85 10/20/2019   - he continues on LT4 75 mcg daily - pt feels good on this dose. - we discussed about taking the thyroid hormone every day, with water, >30 minutes before breakfast, separated by >4 hours from acid reflux medications, calcium, iron, multivitamins. Pt. is taking it correctly.   Philemon Kingdom, MD PhD [facility] Endocrinology

## 2020-01-07 NOTE — Addendum Note (Signed)
Addended by: Cardell Peach I on: 01/07/2020 04:23 PM   Modules accepted: Orders

## 2020-01-07 NOTE — Patient Instructions (Addendum)
Please continue: - Metformin 1000 mg 2x a day with meals - Jardiance 10 mg before breakfast - Lantus 35 mg 2x a day - Humalog 20-25 units before each meal (use 25 units in am) - Trulicity 3 mg weekly   Make sure you are not drinking any sodas.  Please return in 4 months with your sugar log.

## 2020-01-29 ENCOUNTER — Ambulatory Visit: Payer: 59 | Admitting: Family Medicine

## 2020-01-29 ENCOUNTER — Ambulatory Visit (INDEPENDENT_AMBULATORY_CARE_PROVIDER_SITE_OTHER)
Admission: RE | Admit: 2020-01-29 | Discharge: 2020-01-29 | Disposition: A | Discharge status: Home / Self Care | Payer: 59 | Source: Ambulatory Visit | Attending: Family Medicine | Admitting: Family Medicine

## 2020-01-29 ENCOUNTER — Encounter: Payer: Self-pay | Admitting: Family Medicine

## 2020-01-29 ENCOUNTER — Other Ambulatory Visit: Payer: Self-pay

## 2020-01-29 VITALS — BP 110/76 | HR 72 | Temp 97.0°F | Ht 68.75 in | Wt 227.1 lb

## 2020-01-29 DIAGNOSIS — M25572 Pain in left ankle and joints of left foot: Secondary | ICD-10-CM | POA: Diagnosis not present

## 2020-01-29 NOTE — Progress Notes (Signed)
Subjective:    Patient ID: Mitchell Palmer, male    DOB: Jan 19, 1966, 54 y.o.   MRN: [id number]  This visit occurred during the SARS-CoV-2 public health emergency.  Safety protocols were in place, including screening questions prior to the visit, additional usage of staff PPE, and extensive cleaning of exam room while observing appropriate contact time as indicated for disinfecting solutions.    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 Congenitally short pedicles with superimposed disc and facet degeneration resulting in severe spinal stenosis at L2-3 and L3-4. 2. Moderate right neural foraminal stenosis at L4-5 and L5-S1. 3. Dysplastic left-sided posterior elements at L4 and L5.   Patient Active Problem List   Diagnosis Date Noted  . Left ankle pain 01/29/2020  . Stable treated proliferative diabetic retinopathy of right eye determined by examination associated with type 2 diabetes mellitus ([place]) 12/24/2019  . Stable treated proliferative diabetic retinopathy of left eye determined by examination associated with type 2 diabetes mellitus ([place]) 12/24/2019  . Vitreous hemorrhage of right eye ([place]) 12/24/2019  . Nuclear sclerotic cataract of left eye 12/24/2019  . Low testosterone 11/13/2019  . Boil of groin 11/10/2019  . Fatigue 10/20/2019  .  Exposure to COVID-19 virus 12/03/2018  . ETD (eustachian tube dysfunction) 10/16/2018  . Skin rash 08/05/2018  . Neck pain 04/28/2018  . Need for hepatitis B screening test 03/26/2018  . Need for hepatitis C screening test 03/26/2018  . Paresthesia of both feet 11/01/2017  . Hemorrhoids 10/14/2017  . BMI 34.0-34.9,adult 01/24/2016  . Hypothyroidism 01/24/2016  . Uncontrolled type 2 diabetes mellitus with stage 3 chronic kidney disease, with long-term current use of insulin ([facility]) 07/28/2015  . Alcohol consumption heavy 12/06/2014  . Necrobiosis diabeticorum ([place]) 03/01/2014  . Routine general medical examination at a health care facility 09/30/2013  . Low back pain 06/17/2012  . Abnormal chest x-ray 10/15/2011  . PLANTAR FASCIITIS, TRAUMATIC 11/09/2009  . NEOPLASM UNCERTAIN BEHAVIOR OTHER SPEC SITES 10/07/2009  . ESOPHAGEAL STRICTURE 06/27/2009  . GERD 03/31/2009  . OTHER DYSPHAGIA 03/31/2009  . Hyperlipidemia associated with type 2 diabetes mellitus ([place]) 01/06/2009  . Essential hypertension 01/06/2009  . Allergic rhinitis 01/06/2009   Past Medical History:  Diagnosis Date  . Allergy    allegic rhinitis  . Diabetes mellitus    type II  . Esophageal stricture   . GERD (gastroesophageal reflux disease)   . History of hernia repair    as a baby  . Hyperlipidemia   . Hyperplastic polyp of intestine 2010  . Hypertension   . S/P ear surgery, follow-up exam    for drainage   Past Surgical  History:  Procedure Laterality Date  . COLONOSCOPY    . ESOPHAGOGASTRODUODENOSCOPY  04/2009   stricture/GERD  . HERNIA REPAIR     age 84-midline  . [place]   went in behind rt ear-blockage  . POLYPECTOMY    . SHOULDER ARTHROSCOPY WITH ROTATOR CUFF REPAIR AND SUBACROMIAL DECOMPRESSION Right 09/29/2012   Procedure: SHOULDER ARTHROSCOPY WITH ROTATOR CUFF REPAIR AND SUBACROMIAL DECOMPRESSION;  Surgeon: Nita Sells, MD;  Location: [facility];  Service:  Orthopedics;  Laterality: Right;  Right shoulder arthroscopy with subacromial decompression and distal clavicle excision & Debridement of Labrial Tear  . UPPER GASTROINTESTINAL ENDOSCOPY     Social History   Tobacco Use  . Smoking status: Never Smoker  . Smokeless tobacco: Never Used  Substance Use Topics  . Alcohol use: Yes    Alcohol/week: 12.0 standard drinks    Types: 12 Standard drinks or equivalent per week    Comment: 6 pk every 3 days; 12 pk/week  . Drug use: No   Family History  Problem Relation Age of Onset  . Diabetes Mother   . Colon cancer Father 65  . Diabetes Other   . Colon polyps Brother   . Diabetes Maternal Aunt   . Cancer Cousin        breast cancer   Allergies  Allergen Reactions  . Codeine     REACTION: anxious   Current Outpatient Medications on File Prior to Visit  Medication Sig Dispense Refill  . atorvastatin (LIPITOR) 40 MG tablet Take 1 tablet (40 mg total) by mouth daily. 30 tablet 11  . B-D INS SYR ULTRAFINE 1CC/31G 31G X 5/16" 1 ML MISC INJECT 35 UNITS INTO THE SKIN EVERY 12 HOURS AS DIRECTED BY PHYSICIAN. 200 each 2  . B-D UF III MINI PEN NEEDLES 31G X 5 MM MISC USE AS DIRECTED 4 TIMES A DAY 100 each 8  . CIALIS 20 MG tablet TAKE 1 TABLET (20 MG TOTAL) BY MOUTH AS DIRECTED. 4 tablet 5  . cyclobenzaprine (FLEXERIL) 10 MG tablet Take 1 tablet (10 mg total) by mouth 3 (three) times daily as needed. 30 tablet 3  . Dulaglutide (TRULICITY) 3 ZO/1.0RU SOPN Inject 3 mg into the skin once a week. 4 pen 11  . fluticasone (FLONASE) 50 MCG/ACT nasal spray SPRAY 2 SPRAYS INTO EACH NOSTRIL EVERY DAY 16 mL 5  . insulin glargine (LANTUS SOLOSTAR) 100 UNIT/ML Solostar Pen Inject 35 units in the morning and 35 units in the evening 15 pen 3  . insulin lispro (HUMALOG KWIKPEN) 100 UNIT/ML KwikPen INJECT 15-25 UNITS INTO THE SKIN 3 TIMES DAILY W/ MEALS, 10-12 UNITS INTO THE SKIN W/ SNACKS 15 mL 1  . JARDIANCE 10 MG TABS tablet TAKE 1 TABLET BY MOUTH EVERY DAY 30  tablet 11  . levothyroxine (SYNTHROID) 75 MCG tablet TAKE 1 TABLET BY MOUTH ONCE A DAY ON AN EMPTY STOMACH 30 MINUTES BEFORE EATING. 30 tablet 11  . metFORMIN (GLUCOPHAGE) 1000 MG tablet TAKE 1 TABLET (1,000 MG TOTAL) BY MOUTH 2 (TWO) TIMES DAILY WITH A MEAL. 180 tablet 3  . mometasone (ELOCON) 0.1 % cream Apply 1 application topically daily. To affected areas 30 g 1  . olmesartan (BENICAR) 40 MG tablet Take 1 tablet (40 mg total) by mouth daily. 30 tablet 11  . omeprazole (PRILOSEC) 40 MG capsule Take 1 capsule (40 mg total) by mouth daily. 30 capsule 11  . ONETOUCH ULTRA test strip USE TO TEST BLOOD  SUGAR 4 TIMES DAILY AS DIRECTED 400 strip 11  . urea (CARMOL) 40 % CREA Add to callus each night. 85 g 2  . vitamin B-12 (CYANOCOBALAMIN) 1000 MCG tablet Take 1,000 mcg by mouth daily.       No current facility-administered medications on file prior to visit.    Review of Systems  Constitutional: Negative for activity change, appetite change, fatigue, fever and unexpected weight change.  HENT: Negative for congestion, rhinorrhea, sore throat and trouble swallowing.   Eyes: Negative for pain, redness, itching and visual disturbance.  Respiratory: Negative for cough, chest tightness, shortness of breath and wheezing.   Cardiovascular: Negative for chest pain and palpitations.  Gastrointestinal: Negative for abdominal pain, blood in stool, constipation, diarrhea and nausea.  Endocrine: Negative for cold intolerance, heat intolerance, polydipsia and polyuria.  Genitourinary: Negative for difficulty urinating, dysuria, frequency and urgency.  Musculoskeletal: Negative for arthralgias, joint swelling and myalgias.       Some chronic back pain   L ankle and lower leg discomfort  Skin: Negative for pallor and rash.  Neurological: Negative for dizziness, tremors, weakness, numbness and headaches.  Hematological: Negative for adenopathy. Does not bruise/bleed easily.  Psychiatric/Behavioral: Negative  for decreased concentration and dysphoric mood. The patient is not nervous/anxious.        Objective:   Physical Exam Constitutional:      General: He is not in acute distress.    Appearance: Normal appearance. He is obese. He is not ill-appearing.  Eyes:     General: No scleral icterus.    Conjunctiva/sclera: Conjunctivae normal.     Pupils: Pupils are equal, round, and reactive to light.  Cardiovascular:     Rate and Rhythm: Normal rate and regular rhythm.     Pulses: Normal pulses.     Heart sounds: Normal heart sounds.  Pulmonary:     Effort: Pulmonary effort is normal. No respiratory distress.     Breath sounds: Normal breath sounds. No wheezing or rales.  Musculoskeletal:     Cervical back: Neck supple.     Left ankle: No swelling, deformity or ecchymosis. Tenderness present over the ATF ligament, AITF ligament and posterior TF ligament. No base of 5th metatarsal or proximal fibula tenderness. Normal range of motion. Anterior drawer test negative. Normal pulse.     Left Achilles Tendon: Normal. No tenderness or defects. Thompson's test negative.     Comments: Nl rom of ankle - no one position is more painful  Slightly favors R leg with walking  No erythema or swelling or crepitus   Some mild tenderness of distal tibia   Lymphadenopathy:     Cervical: No cervical adenopathy.  Skin:    General: Skin is warm and dry.     Coloration: Skin is not pale.     Findings: No erythema or rash.  Neurological:     Mental Status: He is alert.     Sensory: No sensory deficit.     Motor: No weakness.     Coordination: Coordination normal.     Deep Tendon Reflexes: Reflexes normal.  Psychiatric:        Mood and Affect: Mood normal.           Assessment & Plan:   Problem List Items Addressed This Visit      Other   Left ankle pain    Ankle and lower leg pain - mild tenderness around both malleoli -worse laterally w/o redness or swelling  Pt c/o tingling (has  had back  problems in the past) but none today ? Tendon etiology/overuse injury vs stress fracture  Recommend relative rest for the weekend if possible with elevation and ice (compression if it helps)  Consider voltaren gel otc as directed  Xray of ankle and tib fib Further plan after rad review      Relevant Orders   DG Ankle Complete Left (Completed)   DG Tibia/Fibula Left (Completed)

## 2020-01-29 NOTE — Patient Instructions (Signed)
Watch for swelling or redness  Try compression with ace bandage or ankle brace (just to see if it gives you relief)  Start using ice on the painful area -10 minutes when you can   Elevate foot as well   Check out voltaren gel (diclofenac) over the counter  That may help too   Xray now  We will contact you with a result

## 2020-01-30 NOTE — Assessment & Plan Note (Signed)
Ankle and lower leg pain - mild tenderness around both malleoli -worse laterally w/o redness or swelling  Pt c/o tingling (has had back problems in the past) but none today ? Tendon etiology/overuse injury vs stress fracture  Recommend relative rest for the weekend if possible with elevation and ice (compression if it helps)  Consider voltaren gel otc as directed  Xray of ankle and tib fib Further plan after rad review

## 2020-03-16 ENCOUNTER — Other Ambulatory Visit: Payer: Self-pay | Admitting: Family Medicine

## 2020-03-28 ENCOUNTER — Encounter (INDEPENDENT_AMBULATORY_CARE_PROVIDER_SITE_OTHER): Payer: 59 | Admitting: Ophthalmology

## 2020-03-31 ENCOUNTER — Ambulatory Visit (INDEPENDENT_AMBULATORY_CARE_PROVIDER_SITE_OTHER): Payer: 59 | Admitting: Ophthalmology

## 2020-03-31 ENCOUNTER — Other Ambulatory Visit: Payer: Self-pay

## 2020-03-31 ENCOUNTER — Encounter (INDEPENDENT_AMBULATORY_CARE_PROVIDER_SITE_OTHER): Payer: Self-pay | Admitting: Ophthalmology

## 2020-03-31 DIAGNOSIS — E113592 Type 2 diabetes mellitus with proliferative diabetic retinopathy without macular edema, left eye: Secondary | ICD-10-CM

## 2020-03-31 DIAGNOSIS — E113551 Type 2 diabetes mellitus with stable proliferative diabetic retinopathy, right eye: Secondary | ICD-10-CM

## 2020-03-31 DIAGNOSIS — E113552 Type 2 diabetes mellitus with stable proliferative diabetic retinopathy, left eye: Secondary | ICD-10-CM

## 2020-03-31 HISTORY — DX: Type 2 diabetes mellitus with proliferative diabetic retinopathy without macular edema, left eye: E11.3592

## 2020-03-31 NOTE — Progress Notes (Signed)
03/31/2020     CHIEF COMPLAINT Patient presents for Retina Follow Up   HISTORY OF PRESENT ILLNESS: Mitchell Palmer is a 54 y.o. male who presents to the clinic today for:   HPI    Retina Follow Up    Patient presents with  Diabetic Retinopathy.  In both eyes.  This started 3 months ago.  Severity is mild.  Duration of 3 months.  Since onset it is stable.          Comments    3 Month Diabetic F/U OU  Pt reports continued intermittent floaters OU. Pt denies any new symptoms OU.       Last edited by Rockie Neighbours, [place] on 03/31/2020  1:59 PM. (History)      Referring physician: Tower, Wynelle Fanny, MD [place],  [place] [address]  HISTORICAL INFORMATION:   Selected notes from the MEDICAL RECORD NUMBER    Lab Results  Component Value Date   HGBA1C 7.5 (A) 01/07/2020     CURRENT MEDICATIONS: No current outpatient medications on file. (Ophthalmic Drugs)   No current facility-administered medications for this visit. (Ophthalmic Drugs)   Current Outpatient Medications (Other)  Medication Sig  . atorvastatin (LIPITOR) 40 MG tablet Take 1 tablet (40 mg total) by mouth daily.  . B-D INS SYR ULTRAFINE 1CC/31G 31G X 5/16" 1 ML MISC INJECT 35 UNITS INTO THE SKIN EVERY 12 HOURS AS DIRECTED BY PHYSICIAN.  [name] B-D UF III MINI PEN NEEDLES 31G X 5 MM MISC USE AS DIRECTED 4 TIMES A DAY  . CIALIS 20 MG tablet TAKE 1 TABLET (20 MG TOTAL) BY MOUTH AS DIRECTED.  [name] cyclobenzaprine (FLEXERIL) 10 MG tablet Take 1 tablet (10 mg total) by mouth 3 (three) times daily as needed.  . Dulaglutide (TRULICITY) 3 JX/9.1YN SOPN Inject 3 mg into the skin once a week.  . fluticasone (FLONASE) 50 MCG/ACT nasal spray SPRAY 2 SPRAYS INTO EACH NOSTRIL EVERY DAY  . insulin glargine (LANTUS SOLOSTAR) 100 UNIT/ML Solostar Pen Inject 35 units in the morning and 35 units in the evening  . insulin lispro (HUMALOG KWIKPEN) 100 UNIT/ML KwikPen INJECT 15-25 UNITS INTO THE SKIN 3 TIMES DAILY W/ MEALS,  10-12 UNITS INTO THE SKIN W/ SNACKS  . JARDIANCE 10 MG TABS tablet TAKE 1 TABLET BY MOUTH EVERY DAY  . levothyroxine (SYNTHROID) 75 MCG tablet TAKE 1 TABLET BY MOUTH ONCE A DAY ON AN EMPTY STOMACH 30 MINUTES BEFORE EATING.  . metFORMIN (GLUCOPHAGE) 1000 MG tablet TAKE 1 TABLET (1,000 MG TOTAL) BY MOUTH 2 (TWO) TIMES DAILY WITH A MEAL.  . mometasone (ELOCON) 0.1 % cream Apply 1 application topically daily. To affected areas  . olmesartan (BENICAR) 40 MG tablet Take 1 tablet (40 mg total) by mouth daily.  [name] omeprazole (PRILOSEC) 40 MG capsule Take 1 capsule (40 mg total) by mouth daily.  Glory Rosebush ULTRA test strip USE TO TEST BLOOD SUGAR 4 TIMES DAILY AS DIRECTED  . urea (CARMOL) 40 % CREA Add to callus each night.  . vitamin B-12 (CYANOCOBALAMIN) 1000 MCG tablet Take 1,000 mcg by mouth daily.     No current facility-administered medications for this visit. (Other)      REVIEW OF SYSTEMS:    ALLERGIES Allergies  Allergen Reactions  . Codeine     REACTION: anxious    PAST MEDICAL HISTORY Past Medical History:  Diagnosis Date  . Allergy    allegic rhinitis  . Diabetes mellitus  type II  . Esophageal stricture   . GERD (gastroesophageal reflux disease)   . History of hernia repair    as a baby  . Hyperlipidemia   . Hyperplastic polyp of intestine 2010  . Hypertension   . S/P ear surgery, follow-up exam    for drainage   Past Surgical History:  Procedure Laterality Date  . COLONOSCOPY    . ESOPHAGOGASTRODUODENOSCOPY  04/2009   stricture/GERD  . HERNIA REPAIR     age 26-midline  . [place]   went in behind rt ear-blockage  . POLYPECTOMY    . SHOULDER ARTHROSCOPY WITH ROTATOR CUFF REPAIR AND SUBACROMIAL DECOMPRESSION Right 09/29/2012   Procedure: SHOULDER ARTHROSCOPY WITH ROTATOR CUFF REPAIR AND SUBACROMIAL DECOMPRESSION;  Surgeon: Nita Sells, MD;  Location: [facility];  Service: Orthopedics;  Laterality: Right;  Right  shoulder arthroscopy with subacromial decompression and distal clavicle excision & Debridement of Labrial Tear  . UPPER GASTROINTESTINAL ENDOSCOPY      FAMILY HISTORY Family History  Problem Relation Age of Onset  . Diabetes Mother   . Colon cancer Father 17  . Diabetes Other   . Colon polyps Brother   . Diabetes Maternal Aunt   . Cancer Cousin        breast cancer    SOCIAL HISTORY Social History   Tobacco Use  . Smoking status: Never Smoker  . Smokeless tobacco: Never Used  Substance Use Topics  . Alcohol use: Yes    Alcohol/week: 12.0 standard drinks    Types: 12 Standard drinks or equivalent per week    Comment: 6 pk every 3 days; 12 pk/week  . Drug use: No         OPHTHALMIC EXAM:  Base Eye Exam    Visual Acuity (ETDRS)      Right Left   Dist cc 20/20 20/20   Correction: Glasses       Tonometry (Tonopen, 2:02 PM)      Right Left   Pressure 23 23       Pupils      Pupils Dark Light Shape React APD   Right PERRL 3 3 Round Minimal None   Left PERRL 3 3 Round Minimal None       Visual Fields (Counting fingers)      Left Right    Full    Restrictions  Total superior nasal deficiency       Extraocular Movement      Right Left    Full Full       Neuro/Psych    Oriented x3: Yes   Mood/Affect: Normal       Dilation    Both eyes: 1.0% Mydriacyl, 2.5% Phenylephrine @ 2:02 PM        Slit Lamp and Fundus Exam    External Exam      Right Left   External Normal Normal       Slit Lamp Exam      Right Left   Lids/Lashes Normal Normal   Conjunctiva/Sclera White and quiet White and quiet   Cornea Clear Clear   Anterior Chamber Deep and quiet Deep and quiet   Iris Round and reactive Round and reactive,, small remnants of iris neovascularization 3,5,9 not active   Lens Posterior chamber intraocular lens,, PC is clear 2+ Nuclear sclerosis   Anterior Vitreous Normal Normal       Fundus Exam      Right Left   Posterior  Vitreous Vitrectomized  Normal   Disc Normal Normal   C/D Ratio 0.15 0.2   Macula no macular thickening, no exudates, Microaneurysms, Focal laser scars, no clinically significant macular edema Microaneurysms, no macular thickening, no clinically significant macular edema   Vessels Proliferative diabetic retinopathy quiesced since Proliferative diabetic retinopathy, with good PRP yet there are a temporal window anterior to the equator in the left eye in addition to the region temporal to the macula in the left eye with large regions of capillary nonperfusion which might be triggering the anterior segment neovascularization   Periphery Good PRP and attached Good PRP and attached, some room anterior in the temporal retina for more PRP if disease reactivates          IMAGING AND PROCEDURES  Imaging and Procedures for 03/31/20  OCT, Retina - OU - Both Eyes       Right Eye Quality was good. Scan locations included subfoveal. Central Foveal Thickness: 329. Progression has been stable.   Left Eye Central Foveal Thickness: 0.314. Findings include vitreomacular adhesion .   Notes Small intraretinal CME, not pathologic OD                ASSESSMENT/PLAN:  Proliferative diabetic retinopathy of left eye (HCC) Proliferative diabetic retinopathy, with good PRP yet there are a temporal window anterior to the equator in the left eye in addition to the region temporal to the macula in the left eye with large regions of capillary nonperfusion which might be triggering the anterior segment neovascularization      ICD-10-CM   1. Proliferative diabetic retinopathy of left eye without macular edema associated with type 2 diabetes mellitus ([place])  V61.6073   2. Stable treated proliferative diabetic retinopathy of right eye determined by examination associated with type 2 diabetes mellitus ([place])  E11.3551 OCT, Retina - OU - Both Eyes  3. Stable treated proliferative diabetic retinopathy of left eye determined by  examination associated with type 2 diabetes mellitus ([place])  X10.6269 OCT, Retina - OU - Both Eyes    1.  Return soon for dilation left eye, panretinal photocoagulation PRP temporal to the macula and anterior in the temporal window  2.  3.  Ophthalmic Meds Ordered this visit:  No orders of the defined types were placed in this encounter.      Return in about 4 days (around 04/04/2020) for dilate, OS, PRP.  There are no Patient Instructions on file for this visit.   Explained the diagnoses, plan, and follow up with the patient and they expressed understanding.  Patient expressed understanding of the importance of proper follow up care.   Clent Demark Millicent Blazejewski M.D. Diseases & Surgery of the Retina and Vitreous Retina [facility] Diabetic [place] 03/31/20     Abbreviations: M myopia (nearsighted); A astigmatism; H hyperopia (farsighted); P presbyopia; Mrx spectacle prescription;  CTL contact lenses; OD right eye; OS left eye; OU both eyes  XT exotropia; ET esotropia; PEK punctate epithelial keratitis; PEE punctate epithelial erosions; DES dry eye syndrome; MGD meibomian gland dysfunction; ATs artificial tears; PFAT's preservative free artificial tears; [place] nuclear sclerotic cataract; PSC posterior subcapsular cataract; ERM epi-retinal membrane; PVD posterior vitreous detachment; RD retinal detachment; DM diabetes mellitus; DR diabetic retinopathy; NPDR non-proliferative diabetic retinopathy; PDR proliferative diabetic retinopathy; CSME clinically significant macular edema; DME diabetic macular edema; dbh dot blot hemorrhages; CWS cotton wool spot; POAG primary open angle glaucoma; C/D cup-to-disc ratio; HVF humphrey visual field; GVF goldmann visual field; OCT optical coherence tomography; IOP  intraocular pressure; BRVO Branch retinal vein occlusion; CRVO central retinal vein occlusion; CRAO central retinal artery occlusion; BRAO branch retinal artery occlusion; RT retinal tear; SB scleral buckle; PPV  pars plana vitrectomy; VH Vitreous hemorrhage; PRP panretinal laser photocoagulation; IVK intravitreal kenalog; VMT vitreomacular traction; MH Macular hole;  NVD neovascularization of the disc; NVE neovascularization elsewhere; AREDS age related eye disease study; ARMD age related macular degeneration; POAG primary open angle glaucoma; EBMD epithelial/anterior basement membrane dystrophy; ACIOL anterior chamber intraocular lens; IOL intraocular lens; PCIOL posterior chamber intraocular lens; Phaco/IOL phacoemulsification with intraocular lens placement; [place] photorefractive keratectomy; LASIK laser assisted in situ keratomileusis; HTN hypertension; DM diabetes mellitus; COPD chronic obstructive pulmonary disease

## 2020-03-31 NOTE — Assessment & Plan Note (Signed)
Proliferative diabetic retinopathy, with good PRP yet there are a temporal window anterior to the equator in the left eye in addition to the region temporal to the macula in the left eye with large regions of capillary nonperfusion which might be triggering the anterior segment neovascularization

## 2020-04-04 ENCOUNTER — Ambulatory Visit (INDEPENDENT_AMBULATORY_CARE_PROVIDER_SITE_OTHER): Payer: 59 | Admitting: Ophthalmology

## 2020-04-04 ENCOUNTER — Other Ambulatory Visit: Payer: Self-pay

## 2020-04-04 ENCOUNTER — Encounter (INDEPENDENT_AMBULATORY_CARE_PROVIDER_SITE_OTHER): Payer: Self-pay | Admitting: Ophthalmology

## 2020-04-04 DIAGNOSIS — E113592 Type 2 diabetes mellitus with proliferative diabetic retinopathy without macular edema, left eye: Secondary | ICD-10-CM

## 2020-04-04 NOTE — Patient Instructions (Signed)
To notify the office should new visual acuity changes or decline develop  I did encourage the patient to be aware he might have symptoms of sleep apnea and to monitor for this since this can trigger episodic hypertension and poor blood sugar control

## 2020-04-04 NOTE — Assessment & Plan Note (Signed)
PRP temporal peripheral window, at an anterior to the equator will be treated today OS to prevent retinal nonperfusion progression with neovascularization elsewhere

## 2020-04-04 NOTE — Progress Notes (Signed)
04/04/2020     CHIEF COMPLAINT Patient presents for Retina Follow Up   HISTORY OF PRESENT ILLNESS: Mitchell Palmer is a 54 y.o. male who presents to the clinic today for:   HPI    Retina Follow Up    Patient presents with  Diabetic Retinopathy.  In left eye.  Severity is moderate.  Since onset it is stable.  I, the attending physician,  performed the HPI with the patient and updated documentation appropriately.          Comments    PRP OS.  Pt states no changes in vision.       Last edited by Mitchell Palmer on 04/04/2020  9:11 AM. (History)      Referring physician: Tower, Mitchell Fanny, MD [place],   [id number]  HISTORICAL INFORMATION:   Selected notes from the MEDICAL RECORD NUMBER    Lab Results  Component Value Date   HGBA1C 7.5 (A) 01/07/2020     CURRENT MEDICATIONS: No current outpatient medications on file. (Ophthalmic Drugs)   No current facility-administered medications for this visit. (Ophthalmic Drugs)   Current Outpatient Medications (Other)  Medication Sig   atorvastatin (LIPITOR) 40 MG tablet Take 1 tablet (40 mg total) by mouth daily.   B-D INS SYR ULTRAFINE 1CC/31G 31G X 5/16" 1 ML MISC INJECT 35 UNITS INTO THE SKIN EVERY 12 HOURS AS DIRECTED BY PHYSICIAN.   B-D UF III MINI PEN NEEDLES 31G X 5 MM MISC USE AS DIRECTED 4 TIMES A DAY   CIALIS 20 MG tablet TAKE 1 TABLET (20 MG TOTAL) BY MOUTH AS DIRECTED.   cyclobenzaprine (FLEXERIL) 10 MG tablet Take 1 tablet (10 mg total) by mouth 3 (three) times daily as needed.   Dulaglutide (TRULICITY) 3 UE/4.5WU SOPN Inject 3 mg into the skin once a week.   fluticasone (FLONASE) 50 MCG/ACT nasal spray SPRAY 2 SPRAYS INTO EACH NOSTRIL EVERY DAY   insulin glargine (LANTUS SOLOSTAR) 100 UNIT/ML Solostar Pen Inject 35 units in the morning and 35 units in the evening   insulin lispro (HUMALOG KWIKPEN) 100 UNIT/ML KwikPen INJECT 15-25 UNITS INTO THE SKIN 3 TIMES DAILY W/ MEALS, 10-12  UNITS INTO THE SKIN W/ SNACKS   JARDIANCE 10 MG TABS tablet TAKE 1 TABLET BY MOUTH EVERY DAY   levothyroxine (SYNTHROID) 75 MCG tablet TAKE 1 TABLET BY MOUTH ONCE A DAY ON AN EMPTY STOMACH 30 MINUTES BEFORE EATING.   metFORMIN (GLUCOPHAGE) 1000 MG tablet TAKE 1 TABLET (1,000 MG TOTAL) BY MOUTH 2 (TWO) TIMES DAILY WITH A MEAL.   mometasone (ELOCON) 0.1 % cream Apply 1 application topically daily. To affected areas   olmesartan (BENICAR) 40 MG tablet Take 1 tablet (40 mg total) by mouth daily.   omeprazole (PRILOSEC) 40 MG capsule Take 1 capsule (40 mg total) by mouth daily.   ONETOUCH ULTRA test strip USE TO TEST BLOOD SUGAR 4 TIMES DAILY AS DIRECTED   urea (CARMOL) 40 % CREA Add to callus each night.   vitamin B-12 (CYANOCOBALAMIN) 1000 MCG tablet Take 1,000 mcg by mouth daily.     No current facility-administered medications for this visit. (Other)      REVIEW OF SYSTEMS:    ALLERGIES Allergies  Allergen Reactions   Codeine     REACTION: anxious    PAST MEDICAL HISTORY Past Medical History:  Diagnosis Date   Allergy    allegic rhinitis   Diabetes mellitus    type II  Esophageal stricture    GERD (gastroesophageal reflux disease)    History of hernia repair    as a baby   Hyperlipidemia    Hyperplastic polyp of intestine 2010   Hypertension    S/P ear surgery, follow-up exam    for drainage   Past Surgical History:  Procedure Laterality Date   COLONOSCOPY     ESOPHAGOGASTRODUODENOSCOPY  04/2009   stricture/GERD   HERNIA REPAIR     age [place]   went in behind rt ear-blockage   POLYPECTOMY     SHOULDER ARTHROSCOPY WITH ROTATOR CUFF REPAIR AND SUBACROMIAL DECOMPRESSION Right 09/29/2012   Procedure: SHOULDER ARTHROSCOPY WITH ROTATOR CUFF REPAIR AND SUBACROMIAL DECOMPRESSION;  Surgeon: Nita Sells, MD;  Location: [facility];  Service: Orthopedics;  Laterality: Right;  Right shoulder  arthroscopy with subacromial decompression and distal clavicle excision & Debridement of Labrial Tear   UPPER GASTROINTESTINAL ENDOSCOPY      FAMILY HISTORY Family History  Problem Relation Age of Onset   Diabetes Mother    Colon cancer Father 79   Diabetes Other    Colon polyps Brother    Diabetes Maternal Aunt    Cancer Cousin        breast cancer    SOCIAL HISTORY Social History   Tobacco Use   Smoking status: Never Smoker   Smokeless tobacco: Never Used  Substance Use Topics   Alcohol use: Yes    Alcohol/week: 12.0 standard drinks    Types: 12 Standard drinks or equivalent per week    Comment: 6 pk every 3 days; 12 pk/week   Drug use: No         OPHTHALMIC EXAM:  Base Eye Exam    Visual Acuity (Snellen - Linear)      Right Left   Dist cc 20/20 20/20   Correction: Glasses       Tonometry (Tonopen, 9:13 AM)      Right Left   Pressure 13 9       Neuro/Psych    Oriented x3: Yes   Mood/Affect: Normal       Dilation    Left eye: 1.0% Mydriacyl, 2.5% Phenylephrine @ 9:13 AM        Slit Lamp and Fundus Exam    External Exam      Right Left   External Normal Normal       Slit Lamp Exam      Right Left   Lids/Lashes Normal Normal   Conjunctiva/Sclera White and quiet White and quiet   Cornea Clear Clear   Anterior Chamber Deep and quiet Deep and quiet   Iris Round and reactive Round and reactive,, small remnants of iris neovascularization 3,5,9 not active   Lens Posterior chamber intraocular lens,, PC is clear 2+ Nuclear sclerosis   Anterior Vitreous Normal Normal       Fundus Exam      Right Left   Posterior Vitreous  Normal   Disc  Normal   C/D Ratio  0.2   Macula  Microaneurysms, no macular thickening, no clinically significant macular edema   Vessels  Proliferative diabetic retinopathy, with good PRP yet there are a temporal window anterior to the equator in the left eye in addition to the region temporal to the macula in the  left eye with large regions of capillary nonperfusion which might be triggering the anterior segment neovascularization   Periphery  Good PRP and attached, some  room anterior in the temporal retina for more PRP if disease reactivates          IMAGING AND PROCEDURES  Imaging and Procedures for 04/04/20  Panretinal Photocoagulation - OS - Left Eye       Time Out Confirmed correct patient, procedure, site, and patient consented.   Anesthesia Topical anesthesia was used. Anesthetic medications included Proparacaine 0.5%.   Laser Information The type of laser was diode. Color was yellow. The duration in seconds was 0.02. The spot size was 390 microns. Laser power was 300. Total spots was 574.   Post-op The patient tolerated the procedure well. There were no complications. The patient received written and verbal post procedure care education.                 ASSESSMENT/PLAN:  Proliferative diabetic retinopathy of left eye (HCC) PRP temporal peripheral window, at an anterior to the equator will be treated today OS to prevent retinal nonperfusion progression with neovascularization elsewhere      ICD-10-CM   1. Proliferative diabetic retinopathy of left eye associated with type 2 diabetes mellitus, macular edema presence unspecified (HCC)  X93.7169 Panretinal Photocoagulation - OS - Left Eye    1.  2.  3.  Ophthalmic Meds Ordered this visit:  No orders of the defined types were placed in this encounter.      Return in about 5 months (around 09/04/2020) for DILATE OU, COLOR FP.  Patient Instructions  To notify the office should new visual acuity changes or decline develop  I did encourage the patient to be aware he might have symptoms of sleep apnea and to monitor for this since this can trigger episodic hypertension and poor blood sugar control    Explained the diagnoses, plan, and follow up with the patient and they expressed understanding.  Patient expressed  understanding of the importance of proper follow up care.   Clent Demark Alain Deschene M.D. Diseases & Surgery of the Retina and Vitreous Retina & Diabetic [place] 04/04/20     Abbreviations: M myopia (nearsighted); A astigmatism; H hyperopia (farsighted); P presbyopia; Mrx spectacle prescription;  CTL contact lenses; OD right eye; OS left eye; OU both eyes  XT exotropia; ET esotropia; PEK punctate epithelial keratitis; PEE punctate epithelial erosions; DES dry eye syndrome; MGD meibomian gland dysfunction; ATs artificial tears; PFAT's preservative free artificial tears; [place] nuclear sclerotic cataract; PSC posterior subcapsular cataract; ERM epi-retinal membrane; PVD posterior vitreous detachment; RD retinal detachment; DM diabetes mellitus; DR diabetic retinopathy; NPDR non-proliferative diabetic retinopathy; PDR proliferative diabetic retinopathy; CSME clinically significant macular edema; DME diabetic macular edema; dbh dot blot hemorrhages; CWS cotton wool spot; POAG primary open angle glaucoma; C/D cup-to-disc ratio; HVF humphrey visual field; GVF goldmann visual field; OCT optical coherence tomography; IOP intraocular pressure; BRVO Branch retinal vein occlusion; CRVO central retinal vein occlusion; CRAO central retinal artery occlusion; BRAO branch retinal artery occlusion; RT retinal tear; SB scleral buckle; PPV pars plana vitrectomy; VH Vitreous hemorrhage; PRP panretinal laser photocoagulation; IVK intravitreal kenalog; VMT vitreomacular traction; MH Macular hole;  NVD neovascularization of the disc; NVE neovascularization elsewhere; AREDS age related eye disease study; ARMD age related macular degeneration; POAG primary open angle glaucoma; EBMD epithelial/anterior basement membrane dystrophy; ACIOL anterior chamber intraocular lens; IOL intraocular lens; PCIOL posterior chamber intraocular lens; Phaco/IOL phacoemulsification with intraocular lens placement; [place] photorefractive keratectomy; LASIK laser  assisted in situ keratomileusis; HTN hypertension; DM diabetes mellitus; COPD chronic obstructive pulmonary disease

## 2020-05-02 ENCOUNTER — Other Ambulatory Visit: Payer: Self-pay | Admitting: Internal Medicine

## 2020-05-11 ENCOUNTER — Ambulatory Visit (INDEPENDENT_AMBULATORY_CARE_PROVIDER_SITE_OTHER): Payer: 59 | Admitting: Internal Medicine

## 2020-05-11 ENCOUNTER — Other Ambulatory Visit: Payer: Self-pay

## 2020-05-11 ENCOUNTER — Encounter: Payer: Self-pay | Admitting: Internal Medicine

## 2020-05-11 VITALS — BP 140/72 | HR 82 | Ht 68.75 in | Wt 224.4 lb

## 2020-05-11 DIAGNOSIS — E1122 Type 2 diabetes mellitus with diabetic chronic kidney disease: Secondary | ICD-10-CM

## 2020-05-11 DIAGNOSIS — E7800 Pure hypercholesterolemia, unspecified: Secondary | ICD-10-CM | POA: Diagnosis not present

## 2020-05-11 DIAGNOSIS — E1165 Type 2 diabetes mellitus with hyperglycemia: Secondary | ICD-10-CM

## 2020-05-11 DIAGNOSIS — E039 Hypothyroidism, unspecified: Secondary | ICD-10-CM

## 2020-05-11 DIAGNOSIS — Z794 Long term (current) use of insulin: Secondary | ICD-10-CM

## 2020-05-11 DIAGNOSIS — N183 Chronic kidney disease, stage 3 unspecified: Secondary | ICD-10-CM | POA: Diagnosis not present

## 2020-05-11 DIAGNOSIS — IMO0002 Reserved for concepts with insufficient information to code with codable children: Secondary | ICD-10-CM

## 2020-05-11 LAB — POCT GLYCOSYLATED HEMOGLOBIN (HGB A1C): Hemoglobin A1C: 7.8 % — AB (ref 4.0–5.6)

## 2020-05-11 MED ORDER — TRULICITY 4.5 MG/0.5ML ~~LOC~~ SOAJ
4.5000 mg | SUBCUTANEOUS | 11 refills | Status: DC
Start: 2020-05-11 — End: 2021-05-17

## 2020-05-11 MED ORDER — LANTUS SOLOSTAR 100 UNIT/ML ~~LOC~~ SOPN: PEN_INJECTOR | SUBCUTANEOUS | 3 refills | Status: DC

## 2020-05-11 NOTE — Progress Notes (Signed)
Subjective:     Patient ID: Mitchell Palmer, male   DOB: 02-22-1966, 54 y.o.   MRN: [id number]   This visit occurred during the SARS-CoV-2 public health emergency.  Safety protocols were in place, including screening questions prior to the visit, additional usage of staff PPE, and extensive cleaning of exam room while observing appropriate contact time as indicated for disinfecting solutions.   HPI Mr. Grunewald is a 54 y.o. man, returning for followup for DM2, dx in 2000, uncontrolled, insulin-dependent, with complications (CKD stage 3, erectile dysfunction, mild DR). Last visit 4 mo ago.  He had a steroid inj in knee 1.5 mo ago >> sugars higher: 469. Sugars have been higher for 3 days. The injection did help and he has much less pain in his knee.  Reviewed HbA1c level: Lab Results  Component Value Date   HGBA1C 7.5 (A) 01/07/2020   HGBA1C 7.5 (A) 09/08/2019   HGBA1C 7.1 (A) 04/16/2019   HGBA1C 7.6 (A) 09/03/2018   HGBA1C 8.2 (A) 05/21/2018   HGBA1C 7.4 (A) 01/27/2018   HGBA1C 7.6 10/21/2017   HGBA1C 8.0 05/10/2017   HGBA1C 7.4 11/15/2016   HGBA1C 7.6 08/15/2016   HGBA1C 7.7 05/15/2016   HGBA1C 7.8 02/15/2016   HGBA1C 7.7 11/08/2015   HGBA1C 8.0 (H) 07/22/2015   HGBA1C 8.9 (H) 12/06/2014   HGBA1C 9.1 (H) 03/01/2014   HGBA1C 8.9 (H) 09/30/2013   HGBA1C 9.5 (H) 05/26/2013   HGBA1C 9.9 (H) 01/28/2013   HGBA1C 10.0 (H) 10/14/2012   He is on: - Metformin 1000 mg 2x a day with meals - Jardiance 10 mg before breakfast - added 97/6734 - Trulicity 1.5 >> 3 mg weekly  - Basaglar >> Lantus 35 mg 2x a day - Humalog 20-25 units before each meal, but 25 units with b'fast  He was on: - he stopped  Invokana 08/2016 as he had leg tingling - Cycloset - started 04/22/2013 >> at 1.6 mg daily (tried 3 tabs >> dizziness >> backed off to 2) - Victoza 1.8 mg daily - Actos 45 mg daily - Januvia - glyburide/metformin - Avandia. - Bydureon 2 mg weekly - he hated the thick needle   He is checking  his sugars 2-4x a day: - before b'fast: 160s >> 130 >> 80s, 120-195, 220 (bedtime)  >> 115-130 >> 125-145 - 2 h after b'fast:  79-85>> 181-215 >> n/c - before lunch: 200-220s (20 units) >> 73-166 >> n/c>> 111-140 >> 140-160 >> n/c - 2 h after lunch:  130-145 >> 171-184 >> 230 >> n/c - Before dinner:170-180 >> 79, 105-160 >> (4 pm): 120-155 >> 135-150 >> 145-165 - nighttime: at work 280-290 >> 150-180 >> 190 after snack >> 110-125 >> 145-180 Lowest: 83 >> 79 >> 80s >> 89 >> 78;  He has hypoglycemia awareness in the 80s. Highest 483 (in the hospital) >> 310 >> 450 (steroids) >> 290 (pizza) >> 469 (steroids).  He works nights: 6 PM to 7:30 AM 3.5 days a week  Meals: - 5:30-6 pm (at work): subway sandwich >> 2 hot dogs + doritos >> chicken biscuit - snack at 9-10 pm: pistachios or 1/2 sandwich; pizza  >> 12 am: nabs +peanuts - 11 am-12 pm: salad, meat + veggies + rice (leftovers from take out), pizza rarely >> pizza  Trying to eat an apple a day.  + HL. Last lipid panel: Lab Results  Component Value Date   CHOL 104 10/20/2019   HDL 40.60 10/20/2019   LDLCALC 37 10/20/2019  LDLDIRECT 63.0 03/17/2019   TRIG 133.0 10/20/2019   CHOLHDL 3 10/20/2019  On Lipitor.  -+ Stage II CKD, last BUN/Cr:  Lab Results  Component Value Date   BUN 10 10/20/2019   CREATININE 1.14 10/20/2019   Lab Results  Component Value Date   MICRALBCREAT 0.9 02/15/2016   MICRALBCREAT 1.0 12/07/2014   MICRALBCREAT 0.5 11/04/2012  On olmesartan.  - last eye exam was 04/04/2020: + NPDR OS, without macular edema.  Dr. Katy Fitch >> now Dr. Zadie Rhine.  He had laser surgery OU.  Also, In 07/2018 >> had EMG surgery in his R eye for bleeding. Since last visit, he had cataract sx (Dr. Katy Fitch), another surgery (Dr. Zadie Rhine) on R eye.  - no numbness or tingling in his legs  He also has a history of HTN, GERD, esophageal stricture, plantar fasciitis, eustachian tube dysfunction - status post ear surgery.  Hypothyroidism: -He  started levothyroxine 01/2016 -He was skipping doses in the past and TFTs were fluctuating.  He also skipped doses at the end of last year TSH elevated in 08/2018.  Pt is on levothyroxine 75 mcg daily, taken: - in am - fasting - at least 30 min from b'fast - no Ca, Fe, MVI, PPIs - not on Biotin  Reviewed his TFTs: Lab Results  Component Value Date   TSH 1.85 10/20/2019   TSH 2.95 10/21/2018   TSH 5.18 (H) 09/18/2018   TSH 4.25 10/31/2017   TSH 1.17 09/21/2016   TSH 3.05 04/24/2016   TSH 4.97 (H) 03/06/2016   TSH 8.97 (H) 01/23/2016   TSH 7.07 (H) 12/06/2014   TSH 2.18 09/30/2013   At the end of 07/2018 >> had C spine surgery.  Review of Systems Constitutional: no weight gain/no weight loss, no fatigue, no subjective hyperthermia, no subjective hypothermia Eyes: no blurry vision, no xerophthalmia ENT: no sore throat, no nodules palpated in neck, no dysphagia, no odynophagia, no hoarseness Cardiovascular: no CP/no SOB/no palpitations/no leg swelling Respiratory: no cough/no SOB/no wheezing Gastrointestinal: no N/no V/no D/no C/no acid reflux Musculoskeletal: no muscle aches/+ joint aches Skin: no rashes, no hair loss Neurological: no tremors/no numbness/no tingling/no dizziness  I reviewed pt's medications, allergies, PMH, social hx, family hx, and changes were documented in the history of present illness. Otherwise, unchanged from my initial visit note.   Past Medical History:  Diagnosis Date   Allergy    allegic rhinitis   Diabetes mellitus    type II   Esophageal stricture    GERD (gastroesophageal reflux disease)    History of hernia repair    as a baby   Hyperlipidemia    Hyperplastic polyp of intestine 2010   Hypertension    S/P ear surgery, follow-up exam    for drainage   Past Surgical History:  Procedure Laterality Date   COLONOSCOPY     ESOPHAGOGASTRODUODENOSCOPY  04/2009   stricture/GERD   HERNIA REPAIR     age [place]   went in behind rt ear-blockage   POLYPECTOMY     SHOULDER ARTHROSCOPY WITH ROTATOR CUFF REPAIR AND SUBACROMIAL DECOMPRESSION Right 09/29/2012   Procedure: SHOULDER ARTHROSCOPY WITH ROTATOR CUFF REPAIR AND SUBACROMIAL DECOMPRESSION;  Surgeon: Nita Sells, MD;  Location: [facility];  Service: Orthopedics;  Laterality: Right;  Right shoulder arthroscopy with subacromial decompression and distal clavicle excision & Debridement of Labrial Tear   UPPER GASTROINTESTINAL ENDOSCOPY     Social History   Socioeconomic History  Marital status: Single    Spouse name: Not on file   Number of children: Not on file   Years of education: Not on file   Highest education level: Not on file  Occupational History   Occupation: Mining engineer  Tobacco Use   Smoking status: Never Smoker   Smokeless tobacco: Never Used  Substance and Sexual Activity   Alcohol use: Yes    Alcohol/week: 12.0 standard drinks    Types: 12 Standard drinks or equivalent per week    Comment: 6 pk every 3 days; 12 pk/week   Drug use: No   Sexual activity: Not on file  Other Topics Concern   Not on file  Social History Narrative   Not on file   Social Determinants of Health   Financial Resource Strain:    Difficulty of Paying Living Expenses: Not on file  Food Insecurity:    Worried About [place] in the Last Year: Not on file   Ran Out of Food in the Last Year: Not on file  Transportation Needs:    Lack of Transportation (Medical): Not on file   Lack of Transportation (Non-Medical): Not on file  Physical Activity:    Days of Exercise per Week: Not on file   Minutes of Exercise per Session: Not on file  Stress:    Feeling of Stress : Not on file  Social Connections:    Frequency of Communication with Friends and Family: Not on file   Frequency of Social Gatherings with Friends and Family: Not on file   Attends Religious Services: Not on file    Active Member of Clubs or Organizations: Not on file   Attends Archivist Meetings: Not on file   Marital Status: Not on file  Intimate Partner Violence:    Fear of Current or Ex-Partner: Not on file   Emotionally Abused: Not on file   Physically Abused: Not on file   Sexually Abused: Not on file   Current Outpatient Medications on File Prior to Visit  Medication Sig Dispense Refill   atorvastatin (LIPITOR) 40 MG tablet Take 1 tablet (40 mg total) by mouth daily. 30 tablet 11   B-D INS SYR ULTRAFINE 1CC/31G 31G X 5/16" 1 ML MISC INJECT 35 UNITS INTO THE SKIN EVERY 12 HOURS AS DIRECTED BY PHYSICIAN. 200 each 2   B-D UF III MINI PEN NEEDLES 31G X 5 MM MISC USE AS DIRECTED 4 TIMES A DAY 100 each 8   CIALIS 20 MG tablet TAKE 1 TABLET (20 MG TOTAL) BY MOUTH AS DIRECTED. 4 tablet 5   cyclobenzaprine (FLEXERIL) 10 MG tablet Take 1 tablet (10 mg total) by mouth 3 (three) times daily as needed. 30 tablet 3   Dulaglutide (TRULICITY) 3 EY/8.1KG SOPN Inject 3 mg into the skin once a week. 4 pen 11   fluticasone (FLONASE) 50 MCG/ACT nasal spray SPRAY 2 SPRAYS INTO EACH NOSTRIL EVERY DAY 48 mL 1   insulin glargine (LANTUS SOLOSTAR) 100 UNIT/ML Solostar Pen Inject 35 units in the morning and 35 units in the evening 15 pen 3   insulin lispro (HUMALOG KWIKPEN) 100 UNIT/ML KwikPen INJECT 15-25 UNITS INTO THE SKIN 3 TIMES DAILY W/ MEALS, 10-12 UNITS INTO THE SKIN W/ SNACKS 15 mL 1   JARDIANCE 10 MG TABS tablet TAKE 1 TABLET BY MOUTH EVERY DAY 30 tablet 11   levothyroxine (SYNTHROID) 75 MCG tablet TAKE 1 TABLET BY MOUTH ONCE A DAY ON AN EMPTY STOMACH 30 MINUTES  BEFORE EATING. 30 tablet 11   metFORMIN (GLUCOPHAGE) 1000 MG tablet TAKE 1 TABLET (1,000 MG TOTAL) BY MOUTH 2 (TWO) TIMES DAILY WITH A MEAL. 180 tablet 3   mometasone (ELOCON) 0.1 % cream Apply 1 application topically daily. To affected areas 30 g 1   olmesartan (BENICAR) 40 MG tablet Take 1 tablet (40 mg total) by mouth  daily. 30 tablet 11   omeprazole (PRILOSEC) 40 MG capsule Take 1 capsule (40 mg total) by mouth daily. 30 capsule 11   ONETOUCH ULTRA test strip USE TO TEST BLOOD SUGAR 4 TIMES DAILY AS DIRECTED 400 strip 11   urea (CARMOL) 40 % CREA Add to callus each night. 85 g 2   vitamin B-12 (CYANOCOBALAMIN) 1000 MCG tablet Take 1,000 mcg by mouth daily.       No current facility-administered medications on file prior to visit.   Allergies  Allergen Reactions   Codeine     REACTION: anxious   Family History  Problem Relation Age of Onset   Diabetes Mother    Colon cancer Father 69   Diabetes Other    Colon polyps Brother    Diabetes Maternal Aunt    Cancer Cousin        breast cancer     Objective:   Physical Exam BP 140/72 (BP Location: Left Arm, Patient Position: Sitting, Cuff Size: Normal)    Pulse 82    Ht 5' 8.75" (1.746 m)    Wt 224 lb 6.4 oz (101.8 kg)    SpO2 93%    BMI 33.38 kg/m  Body mass index is 33.38 kg/m.   Wt Readings from Last 3 Encounters:  05/11/20 224 lb 6.4 oz (101.8 kg)  01/29/20 227 lb 1 oz (103 kg)  01/07/20 229 lb (103.9 kg)   Constitutional: overweight, in NAD Eyes: PERRLA, EOMI, no exophthalmos ENT: moist mucous membranes, no thyromegaly, no cervical lymphadenopathy Cardiovascular: RRR, No MRG Respiratory: CTA B Gastrointestinal: abdomen soft, NT, ND, BS+ Musculoskeletal: no deformities, strength intact in all 4 Skin: moist, warm, no rashes Neurological: no tremor with outstretched hands, DTR normal in all 4  Assessment:     1. 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 HL  3. Hypothyroidism    Plan:     1.  Patient with longstanding, uncontrolled, type 2 diabetes, on a complex medication regimen including oral medications (Metformin, SGLT2 inhibitor), basal-bolus insulin regimen, and also weekly  GLP-1 receptor agonist, with still suboptimal control.  At last visit, HbA1c was slightly higher, at 7.5%.  He tolerates the Trulicity well, without GI side effects.  At last visit, sugars were at goal in the morning but they were increasing after breakfast.  He was eating fast food in the morning and we discussed about trying to stop this.  I also advised him to take more Humalog with breakfast.  Later in the day, sugars were at goal so we did not change the rest of the regimen. -At this visit, his recall, his sugars at goal when he goes to bed after work, in the morning, but they are high when he wakes up and also high before his lunch, which is around midnight. For now, we discussed about increasing the dose of Lantus but I also feel that he will need better mealtime coverage. I advised him to increase Trulicity to 4.5 mg weekly. He  tolerates this well. - I advised him to: Patient Instructions  Please continue: - Metformin 1000 mg 2x a day with meals - Jardiance 10 mg before breakfast - Humalog 20-25 units before each meal (use 25 units for breakfast)  Please try to increase: - Trulicity 4.5 mg weekly  - Lantus 40 mg 2x a day   Please return in 4 months with your sugar log.   - we checked his HbA1c: 7.5% (stable) - advised to check sugars at different times of the day - 3x a day, rotating check times - advised for yearly eye exams >> he is UTD - return to clinic in 4 months  2. HL  -Reviewed latest lipid panel from 10/2019: Excellent fractions: Lab Results  Component Value Date   CHOL 104 10/20/2019   HDL 40.60 10/20/2019   LDLCALC 37 10/20/2019   LDLDIRECT 63.0 03/17/2019   TRIG 133.0 10/20/2019   CHOLHDL 3 10/20/2019  -Continues Lipitor without side effects  3. Hypothyroidism - latest thyroid labs reviewed with pt >> normal: Lab Results  Component Value Date   TSH 1.85 10/20/2019   - he continues on LT4 75 mcg daily - pt feels good on this dose. - we discussed about taking  the thyroid hormone every day, with water, >30 minutes before breakfast, separated by >4 hours from acid reflux medications, calcium, iron, multivitamins. Pt. is taking it correctly.   Philemon Kingdom, MD PhD [facility] Endocrinology

## 2020-05-11 NOTE — Patient Instructions (Addendum)
Please continue: - Metformin 1000 mg 2x a day with meals - Jardiance 10 mg before breakfast - Humalog 20-25 units before each meal (use 25 units for breakfast)  Please try to increase: - Trulicity 4.5 mg weekly  - Lantus 40 mg 2x a day   Please return in 4 months with your sugar log.

## 2020-05-30 ENCOUNTER — Telehealth: Payer: Self-pay

## 2020-05-30 DIAGNOSIS — Z832 Family history of diseases of the blood and blood-forming organs and certain disorders involving the immune mechanism: Secondary | ICD-10-CM | POA: Insufficient documentation

## 2020-05-30 NOTE — Telephone Encounter (Signed)
I ordered the lab test  Terri, let me know if it looks correct  Thanks

## 2020-05-30 NOTE — Telephone Encounter (Signed)
Lab appt scheduled.

## 2020-05-30 NOTE — Telephone Encounter (Signed)
I think we can wait until march for labs (also due for PE then-please schedule)   What does he need factor V testing for?

## 2020-05-30 NOTE — Telephone Encounter (Signed)
Pt notified of Dr. Marliss Coots comments. Will schedule CPE and labs in March 2022.   Pt wants to do the factor V testing because his older brother who is 82 was recently diagnosis with it and looking back his father died of blood clots but there was no official testing back then but he thinks father probably had it too. Pt would like to get the testing to see if he has it also

## 2020-05-30 NOTE — Telephone Encounter (Signed)
Pt called requesting for lab orders... pt was seen 11/13/2019 for CPE, no note to f/u in 6 months or repeat labs... A1C checked with Dr Renne Crigler last month... please advise if pt needs to schedule OV vs Lab only appt for repeat lipid etc labs....  Pt also wants to know what he needs to do to have Factor V testing  Please advise

## 2020-05-31 ENCOUNTER — Other Ambulatory Visit (INDEPENDENT_AMBULATORY_CARE_PROVIDER_SITE_OTHER): Payer: 59

## 2020-05-31 ENCOUNTER — Telehealth: Payer: Self-pay | Admitting: Family Medicine

## 2020-05-31 ENCOUNTER — Other Ambulatory Visit: Payer: Self-pay | Admitting: Internal Medicine

## 2020-05-31 ENCOUNTER — Other Ambulatory Visit: Payer: Self-pay

## 2020-05-31 DIAGNOSIS — Z832 Family history of diseases of the blood and blood-forming organs and certain disorders involving the immune mechanism: Secondary | ICD-10-CM

## 2020-05-31 DIAGNOSIS — Z23 Encounter for immunization: Secondary | ICD-10-CM

## 2020-05-31 NOTE — Telephone Encounter (Signed)
Error

## 2020-05-31 NOTE — Addendum Note (Signed)
Addended by: Tammi Sou on: 05/31/2020 09:30 AM   Modules accepted: Orders

## 2020-06-04 LAB — FACTOR 5 LEIDEN: Result: NEGATIVE

## 2020-06-07 ENCOUNTER — Encounter: Payer: Self-pay | Admitting: *Deleted

## 2020-06-24 ENCOUNTER — Other Ambulatory Visit: Payer: Self-pay | Admitting: Internal Medicine

## 2020-06-26 ENCOUNTER — Other Ambulatory Visit: Payer: Self-pay | Admitting: Internal Medicine

## 2020-06-27 ENCOUNTER — Encounter (INDEPENDENT_AMBULATORY_CARE_PROVIDER_SITE_OTHER): Payer: 59 | Admitting: Ophthalmology

## 2020-08-02 ENCOUNTER — Other Ambulatory Visit: Payer: Self-pay | Admitting: Internal Medicine

## 2020-08-18 ENCOUNTER — Other Ambulatory Visit: Payer: Self-pay | Admitting: Internal Medicine

## 2020-08-23 ENCOUNTER — Other Ambulatory Visit: Payer: Self-pay | Admitting: Internal Medicine

## 2020-09-01 ENCOUNTER — Telehealth: Payer: Self-pay | Admitting: Family Medicine

## 2020-09-01 NOTE — Telephone Encounter (Signed)
Please schedule in office visit and we can discuss what is screen able

## 2020-09-01 NOTE — Telephone Encounter (Signed)
Patient would like to have additional testing for prescreening for "all" cancers. Please advise. EM

## 2020-09-02 ENCOUNTER — Other Ambulatory Visit (INDEPENDENT_AMBULATORY_CARE_PROVIDER_SITE_OTHER): Payer: 59

## 2020-09-02 ENCOUNTER — Telehealth (INDEPENDENT_AMBULATORY_CARE_PROVIDER_SITE_OTHER): Payer: 59 | Admitting: Family Medicine

## 2020-09-02 ENCOUNTER — Other Ambulatory Visit: Payer: Self-pay

## 2020-09-02 DIAGNOSIS — Z125 Encounter for screening for malignant neoplasm of prostate: Secondary | ICD-10-CM | POA: Diagnosis not present

## 2020-09-02 DIAGNOSIS — E785 Hyperlipidemia, unspecified: Secondary | ICD-10-CM

## 2020-09-02 DIAGNOSIS — I10 Essential (primary) hypertension: Secondary | ICD-10-CM | POA: Diagnosis not present

## 2020-09-02 DIAGNOSIS — Z79899 Other long term (current) drug therapy: Secondary | ICD-10-CM | POA: Insufficient documentation

## 2020-09-02 DIAGNOSIS — E039 Hypothyroidism, unspecified: Secondary | ICD-10-CM

## 2020-09-02 DIAGNOSIS — E1169 Type 2 diabetes mellitus with other specified complication: Secondary | ICD-10-CM

## 2020-09-02 LAB — COMPREHENSIVE METABOLIC PANEL
ALT: 29 U/L (ref 0–53)
AST: 24 U/L (ref 0–37)
Albumin: 4.5 g/dL (ref 3.5–5.2)
Alkaline Phosphatase: 82 U/L (ref 39–117)
BUN: 24 mg/dL — ABNORMAL HIGH (ref 6–23)
CO2: 27 mEq/L (ref 19–32)
Calcium: 9.7 mg/dL (ref 8.4–10.5)
Chloride: 100 mEq/L (ref 96–112)
Creatinine, Ser: 1.38 mg/dL (ref 0.40–1.50)
GFR: 58.39 mL/min — ABNORMAL LOW (ref >60.00)
Glucose, Bld: 238 mg/dL — ABNORMAL HIGH (ref 70–99)
Potassium: 4.4 mEq/L (ref 3.5–5.1)
Sodium: 137 mEq/L (ref 135–145)
Total Bilirubin: 0.7 mg/dL (ref 0.2–1.2)
Total Protein: 6.6 g/dL (ref 6.0–8.3)

## 2020-09-02 LAB — CBC WITH DIFFERENTIAL/PLATELET
Basophils Absolute: 0.1 10*3/uL (ref 0.0–0.1)
Basophils Relative: 1.3 % (ref 0.0–3.0)
Eosinophils Absolute: 0.3 10*3/uL (ref 0.0–0.7)
Eosinophils Relative: 3.6 % (ref 0.0–5.0)
HCT: 51 % (ref 39.0–52.0)
Hemoglobin: 17.5 g/dL — ABNORMAL HIGH (ref 13.0–17.0)
Lymphocytes Relative: 26.4 % (ref 12.0–46.0)
Lymphs Abs: 2.1 10*3/uL (ref 0.7–4.0)
MCHC: 34.3 g/dL (ref 30.0–36.0)
MCV: 99.1 fl (ref 78.0–100.0)
Monocytes Absolute: 0.7 10*3/uL (ref 0.1–1.0)
Monocytes Relative: 8.7 % (ref 3.0–12.0)
Neutro Abs: 4.9 10*3/uL (ref 1.4–7.7)
Neutrophils Relative %: 60 % (ref 43.0–77.0)
Platelets: 245 10*3/uL (ref 150.0–400.0)
RBC: 5.14 Mil/uL (ref 4.22–5.81)
RDW: 12.5 % (ref 11.5–15.5)
WBC: 8.1 10*3/uL (ref 4.0–10.5)

## 2020-09-02 LAB — LIPID PANEL
Cholesterol: 146 mg/dL (ref 0–200)
HDL: 39.4 mg/dL (ref >39.00)
NonHDL: 106.53
Total CHOL/HDL Ratio: 4
Triglycerides: 325 mg/dL — ABNORMAL HIGH (ref 0.0–149.0)
VLDL: 65 mg/dL — ABNORMAL HIGH (ref 0.0–40.0)

## 2020-09-02 LAB — VITAMIN B12: Vitamin B-12: 1061 pg/mL — ABNORMAL HIGH (ref 211–911)

## 2020-09-02 LAB — VITAMIN D 25 HYDROXY (VIT D DEFICIENCY, FRACTURES): VITD: 22.1 ng/mL — ABNORMAL LOW (ref 30.00–100.00)

## 2020-09-02 LAB — TSH: TSH: 2.47 u[IU]/mL (ref 0.35–4.50)

## 2020-09-02 LAB — LDL CHOLESTEROL, DIRECT: Direct LDL: 65 mg/dL

## 2020-09-02 LAB — PSA: PSA: 0.6 ng/mL (ref 0.10–4.00)

## 2020-09-02 NOTE — Telephone Encounter (Signed)
-----   Message from Ellamae Sia sent at 09/02/2020  7:28 AM EST ----- Regarding: labs for today Patient is scheduled for CPX labs, please order future labs, Thanks , Karna Christmas

## 2020-09-02 NOTE — Telephone Encounter (Signed)
Patient returned my call.  I spoke with Shapale to make sure patient could discuss it with Dr.Tower at patient's physical on 09/06/20. Shapale said she could, so I advised patient Dr.Tower will discuss ordering labs during his physical appointment. Patient said he works for the [employer] and they requested he have cancer screening. He was hoping to have it checked at his lab appointment this morning. He said he understood that Dr.Tower would have to discuss it with him before labs are ordered.

## 2020-09-02 NOTE — Telephone Encounter (Signed)
I left a detailed message for patient to call back and schedule appointment. 

## 2020-09-06 ENCOUNTER — Ambulatory Visit (INDEPENDENT_AMBULATORY_CARE_PROVIDER_SITE_OTHER): Payer: 59 | Admitting: Ophthalmology

## 2020-09-06 ENCOUNTER — Encounter: Payer: 59 | Admitting: Family Medicine

## 2020-09-06 ENCOUNTER — Encounter (INDEPENDENT_AMBULATORY_CARE_PROVIDER_SITE_OTHER): Payer: Self-pay | Admitting: Ophthalmology

## 2020-09-06 ENCOUNTER — Other Ambulatory Visit: Payer: Self-pay

## 2020-09-06 DIAGNOSIS — E113552 Type 2 diabetes mellitus with stable proliferative diabetic retinopathy, left eye: Secondary | ICD-10-CM

## 2020-09-06 DIAGNOSIS — E113551 Type 2 diabetes mellitus with stable proliferative diabetic retinopathy, right eye: Secondary | ICD-10-CM | POA: Diagnosis not present

## 2020-09-06 DIAGNOSIS — H2512 Age-related nuclear cataract, left eye: Secondary | ICD-10-CM

## 2020-09-06 NOTE — Progress Notes (Signed)
09/06/2020     CHIEF COMPLAINT Patient presents for Retina Follow Up (5 Month F/U OU//Pt reports continued floaters OU. No new symptoms reported. No changes to [place] OU.//A1c: 7.5, 4 mo ago/LBS: 153 this AM)   HISTORY OF PRESENT ILLNESS: [facility] is a 54 y.o. male who presents to the clinic today for:   HPI    Retina Follow Up    Patient presents with  Diabetic Retinopathy.  In both eyes.  This started 5 months ago.  Severity is mild.  Duration of 5 months.  Since onset it is stable. Additional comments: 5 Month F/U OU  Pt reports continued floaters OU. No new symptoms reported. No changes to [place] OU.  A1c: 7.5, 4 mo ago LBS: 153 this AM       Last edited by Rockie Neighbours, Bryce on 09/06/2020  8:58 AM. (History)      Referring physician: Tower, Wynelle Fanny, MD [place],  [place] [address]  HISTORICAL INFORMATION:   Selected notes from the MEDICAL RECORD NUMBER    Lab Results  Component Value Date   HGBA1C 7.8 (A) 05/11/2020     CURRENT MEDICATIONS: No current outpatient medications on file. (Ophthalmic Drugs)   No current facility-administered medications for this visit. (Ophthalmic Drugs)   Current Outpatient Medications (Other)  Medication Sig  . atorvastatin (LIPITOR) 40 MG tablet Take 1 tablet (40 mg total) by mouth daily.  . B-D INS SYR ULTRAFINE 1CC/31G 31G X 5/16" 1 ML MISC INJECT 35 UNITS INTO THE SKIN EVERY 12 HOURS AS DIRECTED BY PHYSICIAN.  [name] B-D UF III MINI PEN NEEDLES 31G X 5 MM MISC USE AS DIRECTED 4 TIMES A DAY  . CIALIS 20 MG tablet TAKE 1 TABLET (20 MG TOTAL) BY MOUTH AS DIRECTED.  [name] cyclobenzaprine (FLEXERIL) 10 MG tablet Take 1 tablet (10 mg total) by mouth 3 (three) times daily as needed.  . Dulaglutide (TRULICITY) 4.5 RC/7.8LF SOPN Inject 4.5 mg as directed once a week.  . fluticasone (FLONASE) 50 MCG/ACT nasal spray SPRAY 2 SPRAYS INTO EACH NOSTRIL EVERY DAY  . insulin glargine (LANTUS SOLOSTAR) 100 UNIT/ML Solostar Pen INJECT  35 UNITS IN THE MORNING AND 35 UNITS IN THE EVENING  . insulin lispro (HUMALOG KWIKPEN) 100 UNIT/ML KwikPen INJECT 15-25 UNITS INTO THE SKIN 3 TIMES DAILY W/ MEALS, 10-12 UNITS INTO THE SKIN W/ SNACKS  . JARDIANCE 10 MG TABS tablet TAKE 1 TABLET BY MOUTH EVERY DAY  . levothyroxine (SYNTHROID) 75 MCG tablet TAKE 1 TABLET BY MOUTH ONCE A DAY ON AN EMPTY STOMACH 30 MINUTES BEFORE EATING.  . metFORMIN (GLUCOPHAGE) 1000 MG tablet TAKE 1 TABLET (1,000 MG TOTAL) BY MOUTH 2 (TWO) TIMES DAILY WITH A MEAL.  . mometasone (ELOCON) 0.1 % cream Apply 1 application topically daily. To affected areas  . olmesartan (BENICAR) 40 MG tablet Take 1 tablet (40 mg total) by mouth daily.  [name] omeprazole (PRILOSEC) 40 MG capsule Take 1 capsule (40 mg total) by mouth daily.  Glory Rosebush ULTRA test strip USE TO TEST BLOOD SUGAR 4 TIMES DAILY AS DIRECTED  . urea (CARMOL) 40 % CREA Add to callus each night.  . vitamin B-12 (CYANOCOBALAMIN) 1000 MCG tablet Take 1,000 mcg by mouth daily.     No current facility-administered medications for this visit. (Other)      REVIEW OF SYSTEMS:    ALLERGIES Allergies  Allergen Reactions  . Codeine     REACTION: anxious  PAST MEDICAL HISTORY Past Medical History:  Diagnosis Date  . Allergy    allegic rhinitis  . Diabetes mellitus    type II  . Esophageal stricture   . GERD (gastroesophageal reflux disease)   . History of hernia repair    as a baby  . Hyperlipidemia   . Hyperplastic polyp of intestine 2010  . Hypertension   . Proliferative diabetic retinopathy of left eye ([place]) 03/31/2020   This is active as a patient still has tufts of the anterior segment neovascularization, rubeosis in the left eye every region of capillary nonperfusion should be treated with PRP.  . S/P ear surgery, follow-up exam    for drainage   Past Surgical History:  Procedure Laterality Date  . COLONOSCOPY    . ESOPHAGOGASTRODUODENOSCOPY  04/2009   stricture/GERD  . HERNIA REPAIR      age 61-midline  . [place]   went in behind rt ear-blockage  . POLYPECTOMY    . SHOULDER ARTHROSCOPY WITH ROTATOR CUFF REPAIR AND SUBACROMIAL DECOMPRESSION Right 09/29/2012   Procedure: SHOULDER ARTHROSCOPY WITH ROTATOR CUFF REPAIR AND SUBACROMIAL DECOMPRESSION;  Surgeon: Nita Sells, MD;  Location: [facility];  Service: Orthopedics;  Laterality: Right;  Right shoulder arthroscopy with subacromial decompression and distal clavicle excision & Debridement of Labrial Tear  . UPPER GASTROINTESTINAL ENDOSCOPY      FAMILY HISTORY Family History  Problem Relation Age of Onset  . Diabetes Mother   . Colon cancer Father 44  . Diabetes Other   . Colon polyps Brother   . Diabetes Maternal Aunt   . Cancer Cousin        breast cancer    SOCIAL HISTORY Social History   Tobacco Use  . Smoking status: Never Smoker  . Smokeless tobacco: Never Used  Substance Use Topics  . Alcohol use: Yes    Alcohol/week: 12.0 standard drinks    Types: 12 Standard drinks or equivalent per week    Comment: 6 pk every 3 days; 12 pk/week  . Drug use: No         OPHTHALMIC EXAM:  Base Eye Exam    Visual Acuity (ETDRS)    Correction: Glasses       Tonometry (Tonopen, 8:59 AM)      Right Left   Pressure 15 10       Pupils      Pupils Dark Light Shape React APD   Right PERRL 4 4 Round Minimal None   Left PERRL 4 4 Round Minimal None       Visual Fields (Counting fingers)      Left Right    Full    Restrictions  Total superior temporal deficiency       Extraocular Movement      Right Left    Full Full       Neuro/Psych    Oriented x3: Yes   Mood/Affect: Normal       Dilation    Both eyes: 1.0% Mydriacyl, 2.5% Phenylephrine @ 9:01 AM        Slit Lamp and Fundus Exam    External Exam      Right Left   External Normal Normal       Slit Lamp Exam      Right Left   Lids/Lashes Normal Normal   Conjunctiva/Sclera White and quiet White and  quiet   Cornea Clear Clear   Anterior Chamber Deep and quiet Deep and quiet  Iris Round and reactive Round and reactive,, small remnants of iris neovascularization 3,5,9 not active   Lens Posterior chamber intraocular lens,, PC is clear 2+ Nuclear sclerosis   Anterior Vitreous Normal Normal       Fundus Exam      Right Left   Posterior Vitreous Vitrectomized Normal   Disc Normal Normal   C/D Ratio 0.15 0.2   Macula no macular thickening, no exudates, Microaneurysms, Focal laser scars, no clinically significant macular edema Microaneurysms, no macular thickening, no clinically significant macular edema   Vessels Proliferative diabetic retinopathy quiesced since Proliferative diabetic retinopathy, with good PRP yet there are a temporal window anterior to the equator in the left eye in addition to the region temporal to the macula in the left eye with large regions of capillary nonperfusion which might be triggering the anterior segment neovascularization   Periphery Good PRP and attached Good PRP and attached, some room anterior in the temporal retina for more PRP if disease reactivates          IMAGING AND PROCEDURES  Imaging and Procedures for 09/06/20  Color Fundus Photography Optos - OU - Both Eyes       Right Eye Progression has been stable. Disc findings include normal observations. Macula : microaneurysms.   Left Eye Progression has been stable. Macula : microaneurysms.   Notes Good prp 360, attached, clear media.                ASSESSMENT/PLAN:  Stable treated proliferative diabetic retinopathy of left eye determined by examination associated with type 2 diabetes mellitus ([place]) The nature of regressed proliferative diabetic retinopathy was discussed with the patient. The patient was advised to maintain good glucose, blood pressure, monitor kidney function and serum lipid control as advised by personal physician. Rare risk for reactivation of progression exist  with untreated severe anemia, untreated renal failure, untreated heart failure, and smoking. Complete avoidance of smoking was recommended. The chance of recurrent proliferative diabetic retinopathy was discussed as well as the chance of vitreous hemorrhage for which further treatments may be necessary.   Explained to the patient that the quiescent  proliferative diabetic retinopathy disease is unlikely to ever worsen.  Worsening factors would include however severe anemia, hypertension out-of-control or impending renal failure.  Stable treated proliferative diabetic retinopathy of right eye determined by examination associated with type 2 diabetes mellitus ([place]) The nature of regressed proliferative diabetic retinopathy was discussed with the patient. The patient was advised to maintain good glucose, blood pressure, monitor kidney function and serum lipid control as advised by personal physician. Rare risk for reactivation of progression exist with untreated severe anemia, untreated renal failure, untreated heart failure, and smoking. Complete avoidance of smoking was recommended. The chance of recurrent proliferative diabetic retinopathy was discussed as well as the chance of vitreous hemorrhage for which further treatments may be necessary.   Explained to the patient that the quiescent  proliferative diabetic retinopathy disease is unlikely to ever worsen.  Worsening factors would include however severe anemia, hypertension out-of-control or impending renal failure.      ICD-10-CM   1. Stable treated proliferative diabetic retinopathy of right eye determined by examination associated with type 2 diabetes mellitus ([place])  I09.7353 Color Fundus Photography Optos - OU - Both Eyes  2. Stable treated proliferative diabetic retinopathy of left eye determined by examination associated with type 2 diabetes mellitus ([place])  G99.2426   3. Nuclear sclerotic cataract of left eye  H25.12  1.  No active disease  OU.  2.  Risk factors for this patient include in the future should they occur, anemia, advanced kidney disease, persistent blood sugar elevations, uncontrolled hypertension.  In the absence of these factors likely to maintain quiescent proliferative diabetic retinopathy for many years  3.  OS with cataract, symptoms will be followed by patient.  Undertaking of cataract surgery can be taken anytime of his visual concern or symptoms  Ophthalmic Meds Ordered this visit:  No orders of the defined types were placed in this encounter.      Return in about 9 months (around 06/06/2021) for DILATE OU, COLOR FP, OCT.  There are no Patient Instructions on file for this visit.   Explained the diagnoses, plan, and follow up with the patient and they expressed understanding.  Patient expressed understanding of the importance of proper follow up care.   Clent Demark Cynthis Purington M.D. Diseases & Surgery of the Retina and Vitreous Retina & Diabetic [place] 09/06/20     Abbreviations: M myopia (nearsighted); A astigmatism; H hyperopia (farsighted); P presbyopia; Mrx spectacle prescription;  CTL contact lenses; OD right eye; OS left eye; OU both eyes  XT exotropia; ET esotropia; PEK punctate epithelial keratitis; PEE punctate epithelial erosions; DES dry eye syndrome; MGD meibomian gland dysfunction; ATs artificial tears; PFAT's preservative free artificial tears; [place] nuclear sclerotic cataract; PSC posterior subcapsular cataract; ERM epi-retinal membrane; PVD posterior vitreous detachment; RD retinal detachment; DM diabetes mellitus; DR diabetic retinopathy; NPDR non-proliferative diabetic retinopathy; PDR proliferative diabetic retinopathy; CSME clinically significant macular edema; DME diabetic macular edema; dbh dot blot hemorrhages; CWS cotton wool spot; POAG primary open angle glaucoma; C/D cup-to-disc ratio; HVF humphrey visual field; GVF goldmann visual field; OCT optical coherence tomography; IOP  intraocular pressure; BRVO Branch retinal vein occlusion; CRVO central retinal vein occlusion; CRAO central retinal artery occlusion; BRAO branch retinal artery occlusion; RT retinal tear; SB scleral buckle; PPV pars plana vitrectomy; VH Vitreous hemorrhage; PRP panretinal laser photocoagulation; IVK intravitreal kenalog; VMT vitreomacular traction; MH Macular hole;  NVD neovascularization of the disc; NVE neovascularization elsewhere; AREDS age related eye disease study; ARMD age related macular degeneration; POAG primary open angle glaucoma; EBMD epithelial/anterior basement membrane dystrophy; ACIOL anterior chamber intraocular lens; IOL intraocular lens; PCIOL posterior chamber intraocular lens; Phaco/IOL phacoemulsification with intraocular lens placement; [place] photorefractive keratectomy; LASIK laser assisted in situ keratomileusis; HTN hypertension; DM diabetes mellitus; COPD chronic obstructive pulmonary disease

## 2020-09-06 NOTE — Assessment & Plan Note (Signed)

## 2020-09-09 ENCOUNTER — Encounter: Payer: 59 | Admitting: Family Medicine

## 2020-09-13 ENCOUNTER — Ambulatory Visit: Payer: 59 | Admitting: Internal Medicine

## 2020-09-14 ENCOUNTER — Ambulatory Visit (INDEPENDENT_AMBULATORY_CARE_PROVIDER_SITE_OTHER): Payer: 59 | Admitting: Family Medicine

## 2020-09-14 ENCOUNTER — Encounter: Payer: Self-pay | Admitting: Family Medicine

## 2020-09-14 ENCOUNTER — Other Ambulatory Visit: Payer: Self-pay

## 2020-09-14 VITALS — BP 139/80 | HR 84 | Temp 97.9°F | Ht 68.0 in | Wt 230.0 lb

## 2020-09-14 DIAGNOSIS — R7989 Other specified abnormal findings of blood chemistry: Secondary | ICD-10-CM

## 2020-09-14 DIAGNOSIS — Z23 Encounter for immunization: Secondary | ICD-10-CM

## 2020-09-14 DIAGNOSIS — Z125 Encounter for screening for malignant neoplasm of prostate: Secondary | ICD-10-CM

## 2020-09-14 DIAGNOSIS — E039 Hypothyroidism, unspecified: Secondary | ICD-10-CM

## 2020-09-14 DIAGNOSIS — IMO0002 Reserved for concepts with insufficient information to code with codable children: Secondary | ICD-10-CM

## 2020-09-14 DIAGNOSIS — Z6834 Body mass index (BMI) 34.0-34.9, adult: Secondary | ICD-10-CM

## 2020-09-14 DIAGNOSIS — E1169 Type 2 diabetes mellitus with other specified complication: Secondary | ICD-10-CM

## 2020-09-14 DIAGNOSIS — Z0000 Encounter for general adult medical examination without abnormal findings: Secondary | ICD-10-CM

## 2020-09-14 DIAGNOSIS — E1165 Type 2 diabetes mellitus with hyperglycemia: Secondary | ICD-10-CM

## 2020-09-14 DIAGNOSIS — E785 Hyperlipidemia, unspecified: Secondary | ICD-10-CM

## 2020-09-14 DIAGNOSIS — E113551 Type 2 diabetes mellitus with stable proliferative diabetic retinopathy, right eye: Secondary | ICD-10-CM

## 2020-09-14 DIAGNOSIS — Z794 Long term (current) use of insulin: Secondary | ICD-10-CM

## 2020-09-14 DIAGNOSIS — Z789 Other specified health status: Secondary | ICD-10-CM

## 2020-09-14 DIAGNOSIS — I10 Essential (primary) hypertension: Secondary | ICD-10-CM | POA: Diagnosis not present

## 2020-09-14 DIAGNOSIS — N183 Chronic kidney disease, stage 3 unspecified: Secondary | ICD-10-CM

## 2020-09-14 DIAGNOSIS — E1122 Type 2 diabetes mellitus with diabetic chronic kidney disease: Secondary | ICD-10-CM

## 2020-09-14 MED ORDER — FLUTICASONE PROPIONATE 50 MCG/ACT NA SUSP: NASAL | 3 refills | Status: DC

## 2020-09-14 MED ORDER — OMEPRAZOLE 40 MG PO CPDR: 40.0000 mg | DELAYED_RELEASE_CAPSULE | Freq: Every day | ORAL | 11 refills | Status: DC

## 2020-09-14 MED ORDER — OLMESARTAN MEDOXOMIL 40 MG PO TABS: 40.0000 mg | ORAL_TABLET | Freq: Every day | ORAL | 11 refills | Status: DC

## 2020-09-14 MED ORDER — ATORVASTATIN CALCIUM 40 MG PO TABS: 40.0000 mg | ORAL_TABLET | Freq: Every day | ORAL | 11 refills | Status: DC

## 2020-09-14 MED ORDER — TADALAFIL 20 MG PO TABS: 20.0000 mg | ORAL_TABLET | ORAL | 5 refills | Status: DC

## 2020-09-14 MED ORDER — LEVOTHYROXINE SODIUM 75 MCG PO TABS: ORAL_TABLET | ORAL | 11 refills | Status: DC

## 2020-09-14 NOTE — Patient Instructions (Addendum)
If you are interested in the shingles vaccine series (Shingrix), call your insurance or pharmacy to check on coverage and location it must be given.  If affordable - you can schedule it here or at your pharmacy depending on coverage   I think you will be due for your colonoscopy in July   Cut B12 supplement in 1/2 (your level is high)   Get vitamin D3 and take 2000 iu daily  Good for bones and joints and general health   Take care of yourself   Tetanus shot today   Keep cutting back on alcohol

## 2020-09-14 NOTE — Assessment & Plan Note (Signed)
Reviewed health habits including diet and exercise and skin cancer prevention Reviewed appropriate screening tests for age  Also reviewed health mt list, fam hx and immunization status , as well as social and family history   See HPI Labs reviewed  covid vaccinated and planning on booster when it is time  Interested in shingrix if covered  Due for colonoscopy 8=7/22 pt aware/ fam hx of colon cancer in father Sent for eye exam note Nl psa

## 2020-09-14 NOTE — Assessment & Plan Note (Signed)
Lab Results  Component Value Date   PSA 0.60 09/02/2020    No urinary changes No fam hx of prostate cancer

## 2020-09-14 NOTE — Assessment & Plan Note (Signed)
Discussed how this problem influences overall health and the risks it imposes  Reviewed plan for weight loss with lower calorie diet (via better food choices and also portion control or program like weight watchers) and exercise building up to or more than 30 minutes 5 days per week including some aerobic activity    

## 2020-09-14 NOTE — Assessment & Plan Note (Signed)
Disc goals for lipids and reasons to control them Rev last labs with pt Rev low sat fat diet in detail LDL 65 with atorvastatin 40 mg daily  Trig up poss due to blood sugar level

## 2020-09-14 NOTE — Assessment & Plan Note (Signed)
Sent for last note- per pt several weeks ago

## 2020-09-14 NOTE — Assessment & Plan Note (Signed)
Pt has cut back to approx 3 drinks per day  Disc safe limits  Pt denies addiction currently (perhaps in past) Aware this is bad for blood glucose Plans to cut back more

## 2020-09-14 NOTE — Assessment & Plan Note (Signed)
D level of 22.1  inst to start 2000 iu of D3 otc daily  Disc imp to bone and overall health

## 2020-09-14 NOTE — Assessment & Plan Note (Signed)
Last a1c 7.5 Continues endocrinology care Sent for last DM eye exam note

## 2020-09-14 NOTE — Progress Notes (Signed)
Subjective:    Patient ID: Mitchell Palmer, male    DOB: 10-Mar-1966, 55 y.o.   MRN: [id number]  This visit occurred during the SARS-CoV-2 public health emergency.  Safety protocols were in place, including screening questions prior to the visit, additional usage of staff PPE, and extensive cleaning of exam room while observing appropriate contact time as indicated for disinfecting solutions.    HPI Here for health maintenance exam and to review chronic medical problems    Wt Readings from Last 3 Encounters:  09/14/20 230 lb (104.3 kg)  05/11/20 224 lb 6.4 oz (101.8 kg)  01/29/20 227 lb 1 oz (103 kg)   34.97 kg/m  covid vaccinated august  pna vaccine 2018 Flu shot 10/21 Tdap 5/12  Interested in shingrix   Father had colon cancer at 70 Colonoscopy 7/17 -due this July    Eye exam -this month H/o DM retinopathy  Will go back in 9 months    Prostate health-no problems  No change in urination  Nocturia 2-3 times normally (depends on fluid intake)  Lab Results  Component Value Date   PSA 0.60 09/02/2020  no family history     bp is stable today  No cp or palpitations or headaches or edema  No side effects to medicines  BP Readings from Last 3 Encounters:  09/14/20 139/80  05/11/20 140/72  01/29/20 110/76    Pulse Readings from Last 3 Encounters:  09/14/20 84  05/11/20 82  01/29/20 72   Takes benicar 40 mg daily   DM2 Sees endocrinology -last a1c 7.5  Has retinopathy  Glucose was 238 this last draw  This am 105  Up and down  150-175 avg he thinks  Sees endo next month   Hypothyroidism  Pt has no clinical changes No change in energy level/ hair or skin/ edema and no tremor Lab Results  Component Value Date   TSH 2.47 09/02/2020    Takes levothyroxine 75 mcg daily   Hyperlipidemia  Lab Results  Component Value Date   CHOL 146 09/02/2020   CHOL 104 10/20/2019   CHOL 141 03/17/2019   Lab Results  Component Value Date   HDL 39.40 09/02/2020    HDL 40.60 10/20/2019   HDL 42.00 03/17/2019   Lab Results  Component Value Date   LDLCALC 37 10/20/2019   LDLCALC 51 09/18/2018   LDLCALC 36 10/31/2017   Lab Results  Component Value Date   TRIG 325.0 (H) 09/02/2020   TRIG 133.0 10/20/2019   TRIG 291.0 (H) 03/17/2019   Lab Results  Component Value Date   CHOLHDL 4 09/02/2020   CHOLHDL 3 10/20/2019   CHOLHDL 3 03/17/2019   Lab Results  Component Value Date   LDLDIRECT 65.0 09/02/2020   LDLDIRECT 63.0 03/17/2019   LDLDIRECT 48.0 09/21/2016   Takes atorvastatin 40 mg daily LDL 65 and trig up (perhaps from DM and etoh)   Alcohol intake -more in holidays - December  Drinks [employer]  Has cut back from there  On off work days - 3 mixed drinks per day  Does not currently think he is addicted  Has thought about cutting back more     Lab Results  Component Value Date   WBC 8.1 09/02/2020   HGB 17.5 (H) 09/02/2020   HCT 51.0 09/02/2020   MCV 99.1 09/02/2020   PLT 245.0 09/02/2020   Does not smoke  Not around smoker  Lab Results  Component Value Date   CREATININE 1.38 09/02/2020  BUN 24 (H) 09/02/2020   NA 137 09/02/2020   K 4.4 09/02/2020   CL 100 09/02/2020   CO2 27 09/02/2020   Drinks lots of water  Lab Results  Component Value Date   ALT 29 09/02/2020   AST 24 09/02/2020   ALKPHOS 82 09/02/2020   BILITOT 0.7 09/02/2020    Takes ppi Lab Results  Component Value Date   VITAMINB12 1,061 (H) 09/02/2020    Vit D low at 22.1  Patient Active Problem List   Diagnosis Date Noted  . Low vitamin D level 09/14/2020  . Prostate cancer screening 09/02/2020  . Current use of proton pump inhibitor 09/02/2020  . Family history of factor V Leiden mutation 05/30/2020  . Left ankle pain 01/29/2020  . Stable treated proliferative diabetic retinopathy of right eye determined by examination associated with type 2 diabetes mellitus ([place]) 12/24/2019  . Stable treated proliferative diabetic retinopathy of left eye  determined by examination associated with type 2 diabetes mellitus ([place]) 12/24/2019  . Vitreous hemorrhage of right eye ([place]) 12/24/2019  . Nuclear sclerotic cataract of left eye 12/24/2019  . Low testosterone 11/13/2019  . Boil of groin 11/10/2019  . Fatigue 10/20/2019  . Exposure to COVID-19 virus 12/03/2018  . ETD (eustachian tube dysfunction) 10/16/2018  . Neck pain 04/28/2018  . Need for hepatitis B screening test 03/26/2018  . Need for hepatitis C screening test 03/26/2018  . Paresthesia of both feet 11/01/2017  . Hemorrhoids 10/14/2017  . BMI 34.0-34.9,adult 01/24/2016  . Hypothyroidism 01/24/2016  . Uncontrolled type 2 diabetes mellitus with stage 3 chronic kidney disease, with long-term current use of insulin ([place]) 07/28/2015  . Alcohol consumption heavy 12/06/2014  . Necrobiosis diabeticorum ([place]) 03/01/2014  . Routine general medical examination at a health care facility 09/30/2013  . Low back pain 06/17/2012  . Abnormal chest x-ray 10/15/2011  . PLANTAR FASCIITIS, TRAUMATIC 11/09/2009  . NEOPLASM UNCERTAIN BEHAVIOR OTHER SPEC SITES 10/07/2009  . ESOPHAGEAL STRICTURE 06/27/2009  . GERD 03/31/2009  . OTHER DYSPHAGIA 03/31/2009  . Hyperlipidemia associated with type 2 diabetes mellitus ([place]) 01/06/2009  . Essential hypertension 01/06/2009  . Allergic rhinitis 01/06/2009   Past Medical History:  Diagnosis Date  . Allergy    allegic rhinitis  . Diabetes mellitus    type II  . Esophageal stricture   . GERD (gastroesophageal reflux disease)   . History of hernia repair    as a baby  . Hyperlipidemia   . Hyperplastic polyp of intestine 2010  . Hypertension   . Proliferative diabetic retinopathy of left eye ([place]) 03/31/2020   This is active as a patient still has tufts of the anterior segment neovascularization, rubeosis in the left eye every region of capillary nonperfusion should be treated with PRP.  . S/P ear surgery, follow-up exam    for drainage   Past  Surgical History:  Procedure Laterality Date  . COLONOSCOPY    . ESOPHAGOGASTRODUODENOSCOPY  04/2009   stricture/GERD  . HERNIA REPAIR     age 37-midline  . [place]   went in behind rt ear-blockage  . POLYPECTOMY    . SHOULDER ARTHROSCOPY WITH ROTATOR CUFF REPAIR AND SUBACROMIAL DECOMPRESSION Right 09/29/2012   Procedure: SHOULDER ARTHROSCOPY WITH ROTATOR CUFF REPAIR AND SUBACROMIAL DECOMPRESSION;  Surgeon: Nita Sells, MD;  Location: [facility];  Service: Orthopedics;  Laterality: Right;  Right shoulder arthroscopy with subacromial decompression and distal clavicle excision & Debridement of Labrial Tear  .  UPPER GASTROINTESTINAL ENDOSCOPY     Social History   Tobacco Use  . Smoking status: Never Smoker  . Smokeless tobacco: Never Used  Substance Use Topics  . Alcohol use: Yes    Alcohol/week: 12.0 standard drinks    Types: 12 Standard drinks or equivalent per week    Comment: 6 pk every 3 days; 12 pk/week  . Drug use: No   Family History  Problem Relation Age of Onset  . Diabetes Mother   . Colon cancer Father 73  . Diabetes Other   . Colon polyps Brother   . Diabetes Maternal Aunt   . Cancer Cousin        breast cancer   Allergies  Allergen Reactions  . Codeine     REACTION: anxious   Current Outpatient Medications on File Prior to Visit  Medication Sig Dispense Refill  . B-D INS SYR ULTRAFINE 1CC/31G 31G X 5/16" 1 ML MISC INJECT 35 UNITS INTO THE SKIN EVERY 12 HOURS AS DIRECTED BY PHYSICIAN. 200 each 2  . B-D UF III MINI PEN NEEDLES 31G X 5 MM MISC USE AS DIRECTED 4 TIMES A DAY 100 each 8  . cyclobenzaprine (FLEXERIL) 10 MG tablet Take 1 tablet (10 mg total) by mouth 3 (three) times daily as needed. 30 tablet 3  . Dulaglutide (TRULICITY) 4.5 0000000 SOPN Inject 4.5 mg as directed once a week. 2 mL 11  . insulin glargine (LANTUS SOLOSTAR) 100 UNIT/ML Solostar Pen INJECT 35 UNITS IN THE MORNING AND 35 UNITS IN THE EVENING 15 mL  3  . insulin lispro (HUMALOG KWIKPEN) 100 UNIT/ML KwikPen INJECT 15-25 UNITS INTO THE SKIN 3 TIMES DAILY W/ MEALS, 10-12 UNITS INTO THE SKIN W/ SNACKS 15 mL 1  . JARDIANCE 10 MG TABS tablet TAKE 1 TABLET BY MOUTH EVERY DAY 30 tablet 11  . metFORMIN (GLUCOPHAGE) 1000 MG tablet TAKE 1 TABLET (1,000 MG TOTAL) BY MOUTH 2 (TWO) TIMES DAILY WITH A MEAL. 180 tablet 3  . mometasone (ELOCON) 0.1 % cream Apply 1 application topically daily. To affected areas 30 g 1  . ONETOUCH ULTRA test strip USE TO TEST BLOOD SUGAR 4 TIMES DAILY AS DIRECTED 400 strip 11  . urea (CARMOL) 40 % CREA Add to callus each night. 85 g 2  . vitamin B-12 (CYANOCOBALAMIN) 1000 MCG tablet Take 1,000 mcg by mouth daily.     No current facility-administered medications on file prior to visit.    Review of Systems  Constitutional: Positive for fatigue. Negative for activity change, appetite change, fever and unexpected weight change.  HENT: Negative for congestion, rhinorrhea, sore throat and trouble swallowing.   Eyes: Negative for pain, redness, itching and visual disturbance.  Respiratory: Negative for cough, chest tightness, shortness of breath and wheezing.   Cardiovascular: Negative for chest pain and palpitations.  Gastrointestinal: Negative for abdominal pain, blood in stool, constipation, diarrhea and nausea.  Endocrine: Negative for cold intolerance, heat intolerance, polydipsia and polyuria.  Genitourinary: Negative for difficulty urinating, dysuria, frequency and urgency.  Musculoskeletal: Negative for arthralgias, joint swelling and myalgias.  Skin: Negative for pallor and rash.  Neurological: Negative for dizziness, tremors, weakness, numbness and headaches.  Hematological: Negative for adenopathy. Does not bruise/bleed easily.  Psychiatric/Behavioral: Negative for decreased concentration and dysphoric mood. The patient is not nervous/anxious.        Objective:   Physical Exam Constitutional:      General: He  is not in acute distress.    Appearance: Normal appearance.  He is well-developed. He is obese. He is not ill-appearing or diaphoretic.  HENT:     Head: Normocephalic and atraumatic.     Right Ear: Tympanic membrane, ear canal and external ear normal.     Left Ear: Tympanic membrane, ear canal and external ear normal.     Nose: Nose normal. No congestion.     Mouth/Throat:     Mouth: Mucous membranes are moist.     Pharynx: Oropharynx is clear. No posterior oropharyngeal erythema.  Eyes:     General: No scleral icterus.       Right eye: No discharge.        Left eye: No discharge.     Conjunctiva/sclera: Conjunctivae normal.     Pupils: Pupils are equal, round, and reactive to light.  Neck:     Thyroid: No thyromegaly.     Vascular: No carotid bruit or JVD.  Cardiovascular:     Rate and Rhythm: Normal rate and regular rhythm.     Pulses: Normal pulses.     Heart sounds: Normal heart sounds. No gallop.   Pulmonary:     Effort: Pulmonary effort is normal. No respiratory distress.     Breath sounds: Normal breath sounds. No wheezing or rales.     Comments: Good air exch Chest:     Chest wall: No tenderness.  Abdominal:     General: Bowel sounds are normal. There is no distension or abdominal bruit.     Palpations: Abdomen is soft. There is no mass.     Tenderness: There is no abdominal tenderness.     Hernia: No hernia is present.     Comments: No HSM  Musculoskeletal:        General: No tenderness.     Cervical back: Normal range of motion and neck supple. No rigidity. No muscular tenderness.     Right lower leg: No edema.     Left lower leg: No edema.     Comments: No kyphosis   Lymphadenopathy:     Cervical: No cervical adenopathy.  Skin:    General: Skin is warm and dry.     Coloration: Skin is not pale.     Findings: No erythema or rash.     Comments: Solar lentigines diffusely   Neurological:     Mental Status: He is alert.     Cranial Nerves: No cranial nerve  deficit.     Motor: No abnormal muscle tone.     Coordination: Coordination normal.     Gait: Gait normal.     Deep Tendon Reflexes: Reflexes are normal and symmetric. Reflexes normal.     Comments: No tremor   Psychiatric:        Mood and Affect: Mood normal.        Cognition and Memory: Cognition and memory normal.           Assessment & Plan:   Problem List Items Addressed This Visit      Cardiovascular and Mediastinum   Essential hypertension    bp in fair control at this time  BP Readings from Last 1 Encounters:  09/14/20 139/80   No changes needed Most recent labs reviewed  Disc lifstyle change with low sodium diet and exercise  Plan to continue benicar 40 mg daily       Relevant Medications   atorvastatin (LIPITOR) 40 MG tablet   tadalafil (CIALIS) 20 MG tablet   olmesartan (BENICAR) 40 MG tablet  Endocrine   Hyperlipidemia associated with type 2 diabetes mellitus ([place])    Disc goals for lipids and reasons to control them Rev last labs with pt Rev low sat fat diet in detail LDL 65 with atorvastatin 40 mg daily  Trig up poss due to blood sugar level      Relevant Medications   atorvastatin (LIPITOR) 40 MG tablet   olmesartan (BENICAR) 40 MG tablet   Uncontrolled type 2 diabetes mellitus with stage 3 chronic kidney disease, with long-term current use of insulin ([facility])    Last a1c 7.5 Continues endocrinology care Sent for last DM eye exam note        Relevant Medications   atorvastatin (LIPITOR) 40 MG tablet   olmesartan (BENICAR) 40 MG tablet   Hypothyroidism    Hypothyroidism  Pt has no clinical changes No change in energy level/ hair or skin/ edema and no tremor Lab Results  Component Value Date   TSH 2.47 09/02/2020    Plan to continue levothyroxine 75 mcg daily       Relevant Medications   levothyroxine (SYNTHROID) 75 MCG tablet   Stable treated proliferative diabetic retinopathy of right eye determined by examination associated with  type 2 diabetes mellitus ([place])    Sent for last note- per pt several weeks ago      Relevant Medications   atorvastatin (LIPITOR) 40 MG tablet   olmesartan (BENICAR) 40 MG tablet     Other   Routine general medical examination at a health care facility - Primary    Reviewed health habits including diet and exercise and skin cancer prevention Reviewed appropriate screening tests for age  Also reviewed health mt list, fam hx and immunization status , as well as social and family history   See HPI Labs reviewed  covid vaccinated and planning on booster when it is time  Interested in shingrix if covered  Due for colonoscopy 8=7/22 pt aware/ fam hx of colon cancer in father Sent for eye exam note Nl psa        Alcohol consumption heavy    Pt has cut back to approx 3 drinks per day  Disc safe limits  Pt denies addiction currently (perhaps in past) Aware this is bad for blood glucose Plans to cut back more       BMI 34.0-34.9,adult    Discussed how this problem influences overall health and the risks it imposes  Reviewed plan for weight loss with lower calorie diet (via better food choices and also portion control or program like weight watchers) and exercise building up to or more than 30 minutes 5 days per week including some aerobic activity         Prostate cancer screening    Lab Results  Component Value Date   PSA 0.60 09/02/2020    No urinary changes No fam hx of prostate cancer      Low vitamin D level    D level of 22.1  inst to start 2000 iu of D3 otc daily  Disc imp to bone and overall health         Other Visit Diagnoses    Need for Td vaccine       Relevant Orders   Td : Tetanus/diphtheria >7yo Preservative  free (Completed)

## 2020-09-14 NOTE — Assessment & Plan Note (Signed)
bp in fair control at this time  BP Readings from Last 1 Encounters:  09/14/20 139/80   No changes needed Most recent labs reviewed  Disc lifstyle change with low sodium diet and exercise  Plan to continue benicar 40 mg daily

## 2020-09-14 NOTE — Assessment & Plan Note (Signed)
Hypothyroidism  Pt has no clinical changes No change in energy level/ hair or skin/ edema and no tremor Lab Results  Component Value Date   TSH 2.47 09/02/2020    Plan to continue levothyroxine 75 mcg daily

## 2020-09-15 ENCOUNTER — Encounter: Payer: Self-pay | Admitting: Family Medicine

## 2020-09-15 MED ORDER — CYCLOBENZAPRINE HCL 10 MG PO TABS: 10.0000 mg | ORAL_TABLET | Freq: Three times a day (TID) | ORAL | 3 refills | Status: DC | PRN

## 2020-09-16 IMAGING — MR MR CERVICAL SPINE W/O CM
4 of 5 series · 32 of 48 positions shown · non-contrast
Comparison: Plain film cervical spine 04/28/2018.

CLINICAL DATA: Neck pain. The patient experiences bilateral foot
tingling when she flexes her neck.

EXAM:
MRI CERVICAL SPINE WITHOUT CONTRAST
TECHNIQUE: Multiplanar, multisequence MR imaging of the cervical spine was
performed. No intravenous contrast was administered.

[Series 2: T2 · sagittal · 3.0mm · 0.69mm/px · 8 of 15 slices shown (1 of 3)]
[im 1/15]
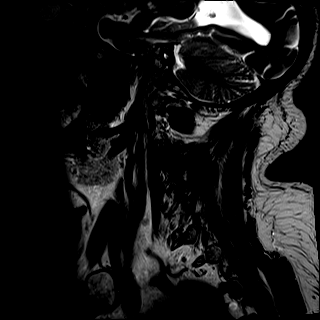
[im 3/15]
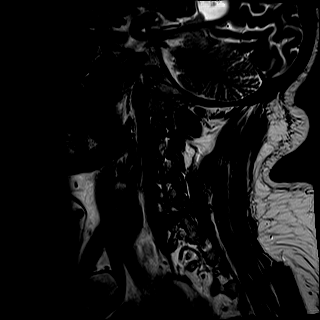
[im 5/15]
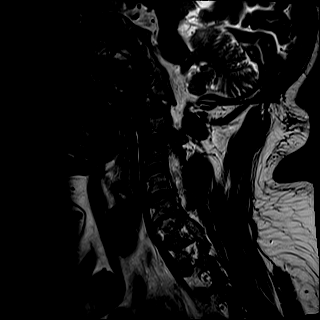
[im 7/15]
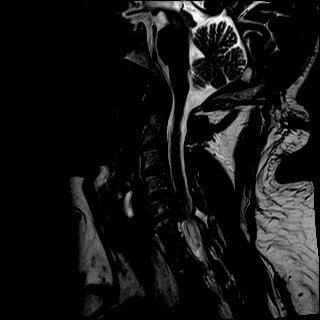
[im 9/15]
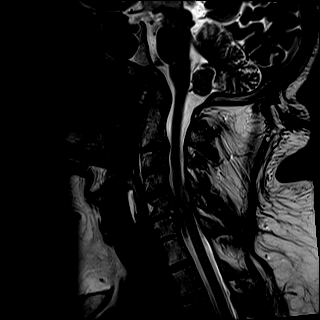
[im 11/15]
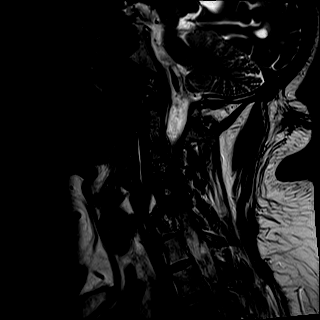
[im 13/15]
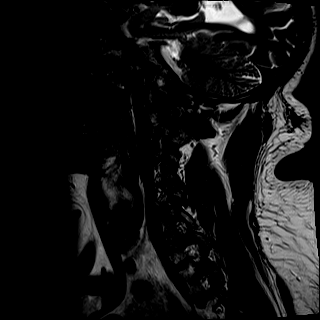
[im 15/15]
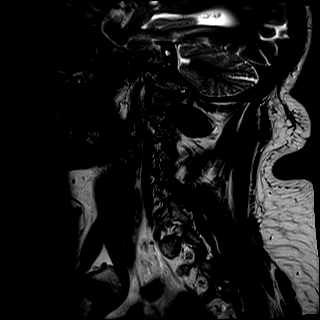

[Series 3: T1 · sagittal · 3.0mm · 0.69mm/px · 6 of 15 slices shown]
[im 1/15]
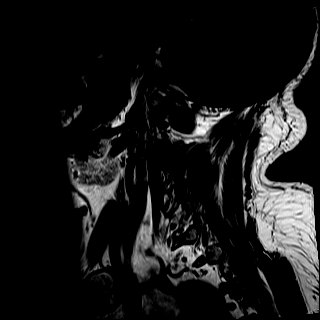
[im 3/15]
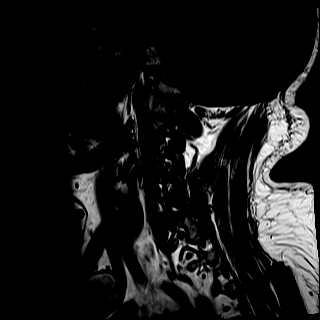
[im 5/15]
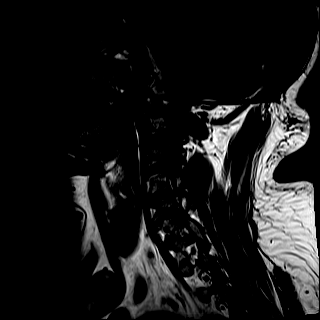
[im 8/15]
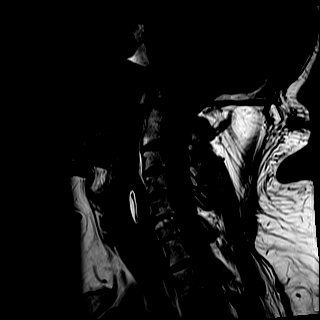
[im 10/15]
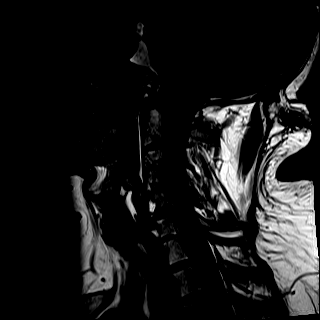
[im 12/15]
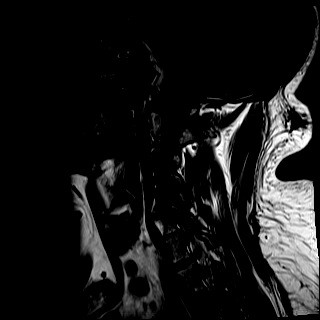

[Series 5: T2 · axial · 3.0mm · 0.62mm/px · z∈[-90,+3]mm · 9 of 28 slices shown (2 of 3)]
[im 1/28]
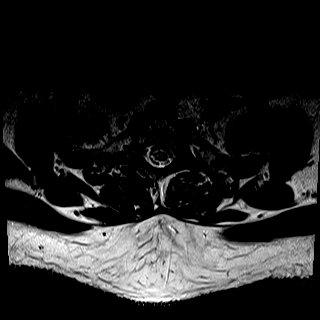
[im 5/28]
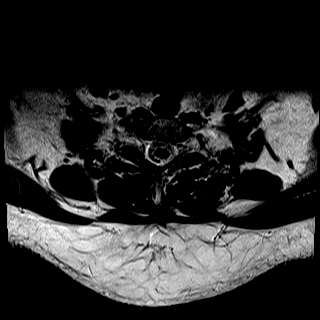
[im 10/28]
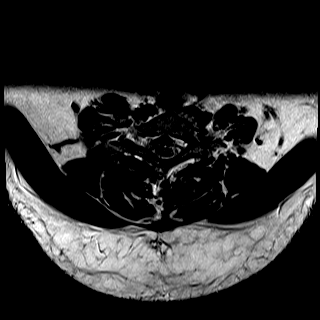
[im 12/28]
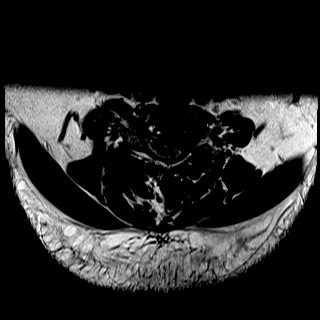
[im 14/28]
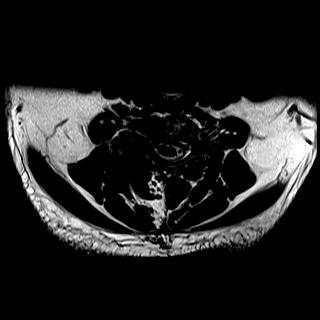
[im 16/28]
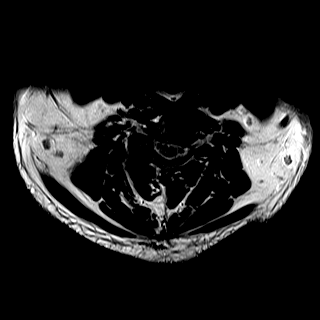
[im 19/28]
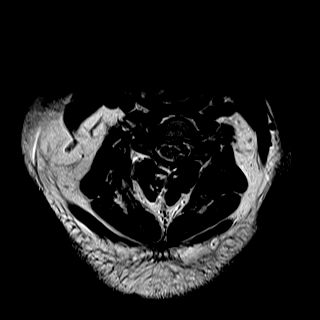
[im 23/28]
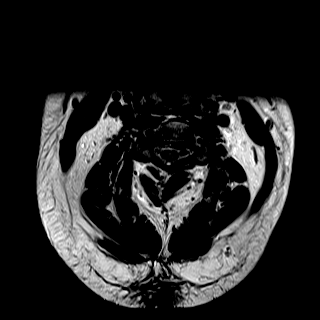
[im 28/28]
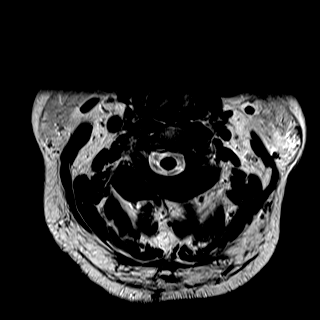

[Series 6: T2 · axial · 3.0mm · 0.39mm/px · z∈[-90,+3]mm · 9 of 28 slices shown (3 of 3)]
[im 1/28]
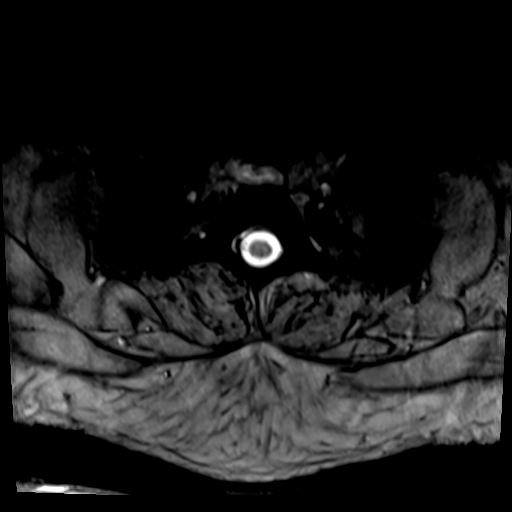
[im 5/28]
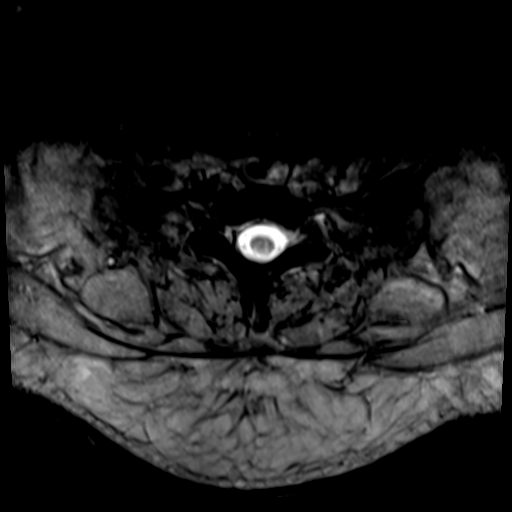
[im 10/28]
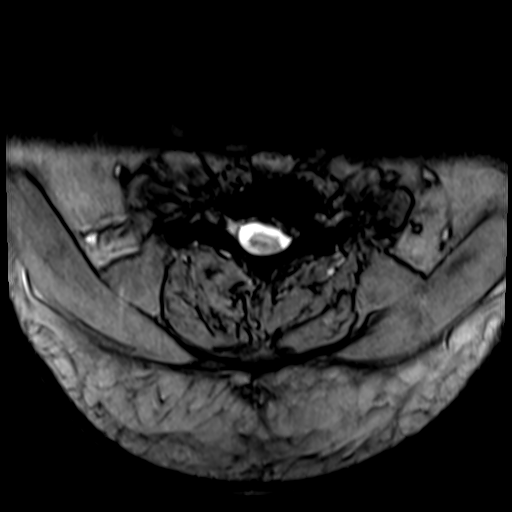
[im 12/28]
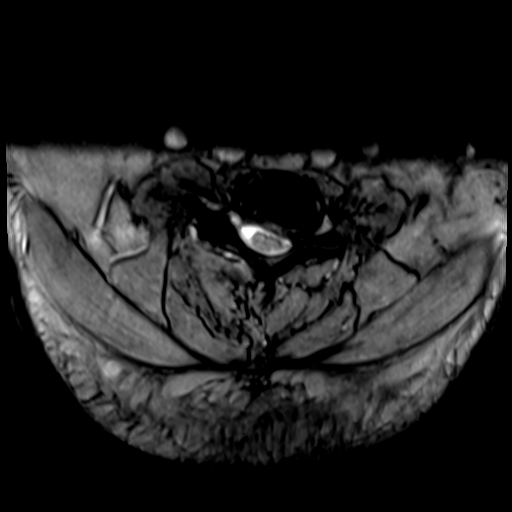
[im 14/28]
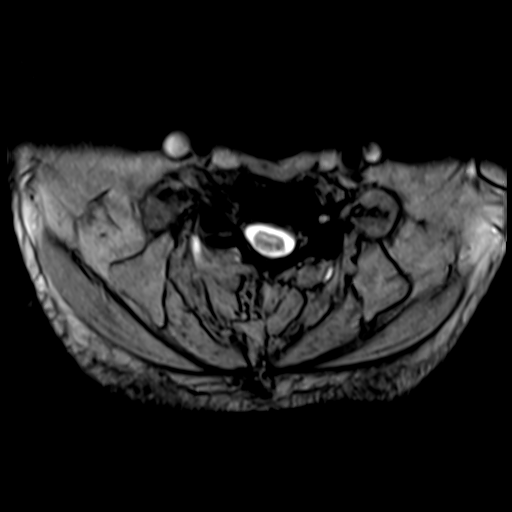
[im 16/28]
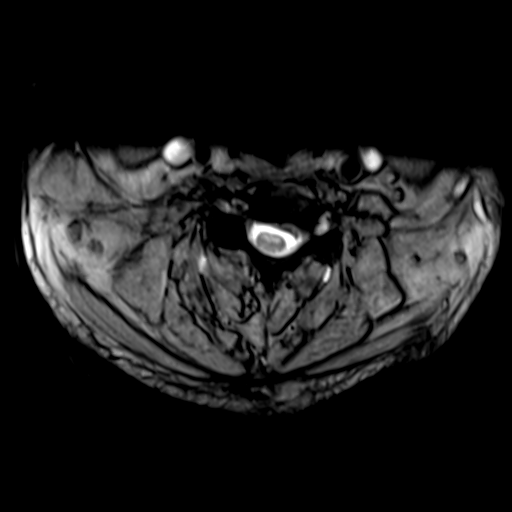
[im 19/28]
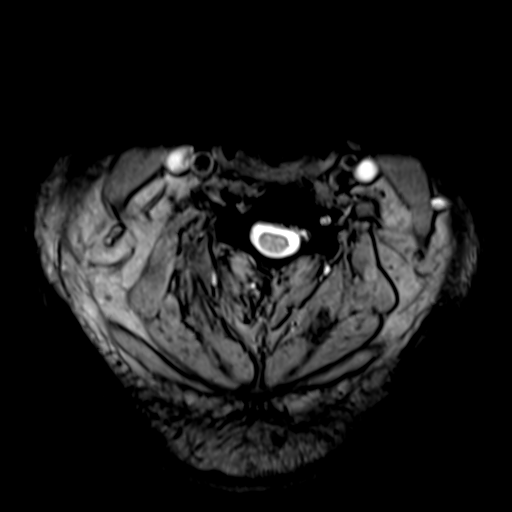
[im 23/28]
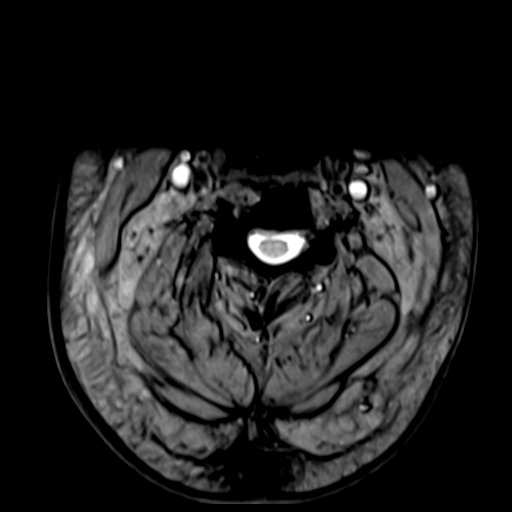
[im 28/28]
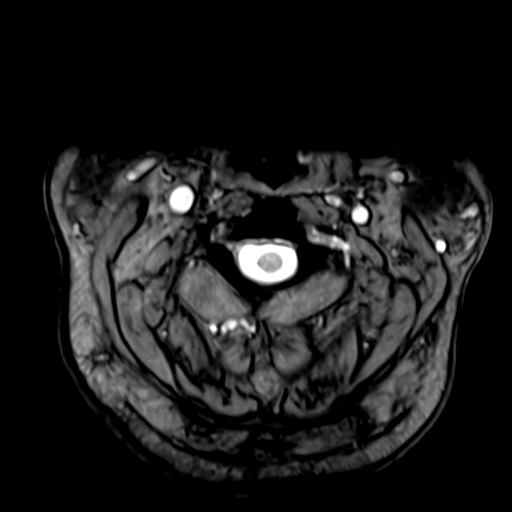

[32 of 48 positions shown; findings below may reference images not displayed]

FINDINGS: Alignment: Maintained.

Vertebrae: No fracture or worrisome lesion. Degenerative endplate
signal change is present at C5-6.

Cord: Normal signal throughout.

Posterior Fossa, vertebral arteries, paraspinal tissues: None.

Disc levels:

C2-3: Uncovertebral spurring and facet degenerative disease on the
right cause moderate to moderately severe foraminal narrowing. The
central canal and left foramen are open.

C3-4: Shallow disc bulge and uncovertebral disease. The central
canal and neural foramina are open.

C4-5: There is a shallow disc bulge and mild-to-moderate facet
arthropathy, worse on the right. The central canal and foramina
remain open.

C5-6: Loss of disc space height is present. The patient has a
diffuse disc bulge with endplate spurring and uncovertebral disease,
worse on the left. There is slight deformity of the ventral cord.
Severe left and moderate right foraminal narrowing is identified.

C6-7: Only rudimentary disc material is present. Congenital fusion
of the left facets is identified. The central canal and foramina are
open.

C7-T1: Negative.
IMPRESSION: Spondylosis worst at C5-6 where there is slight deformity of the
ventral cord, severe left and moderate right foraminal narrowing due
to a disc bulge and left much worse than right uncovertebral
disease.

Moderately severe right foraminal narrowing due to facet arthropathy
and uncovertebral spurring.

Rudimentary disc material at C6-7 with congenital fusion of the left
facets consistent with T[place]a deformity

## 2020-09-16 IMAGING — MR MR LUMBAR SPINE W/O CM
4 of 5 series · 25 of 48 positions shown · non-contrast
Comparison: Lumbar spine radiographs 04/28/2018

CLINICAL DATA: Acute on chronic low back pain.

EXAM:
MRI LUMBAR SPINE WITHOUT CONTRAST
TECHNIQUE: Multiplanar, multisequence MR imaging of the lumbar spine was
performed. No intravenous contrast was administered.

[Series 2: T1 · sagittal · 4.0mm · 0.81mm/px · 6 of 15 slices shown (1 of 2)]
[im 1/15]
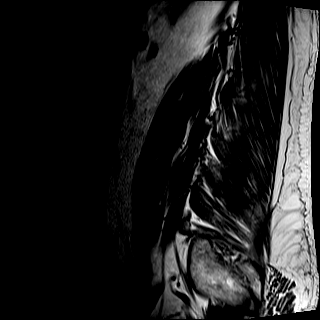
[im 3/15]
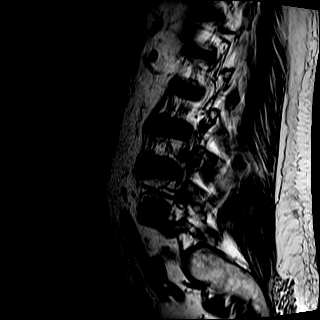
[im 6/15]
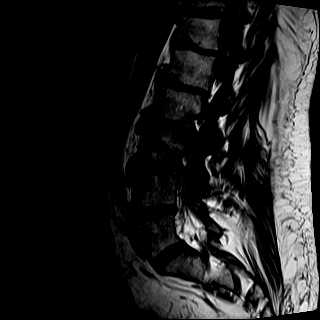
[im 9/15]
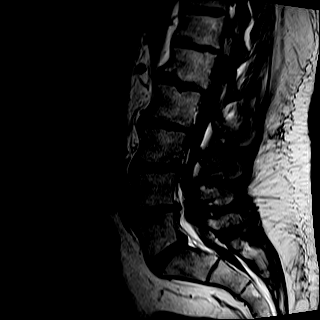
[im 12/15]
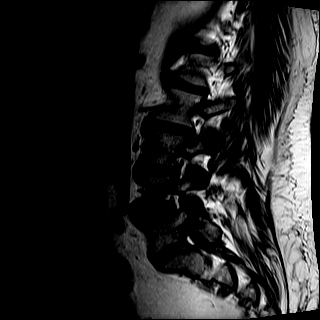
[im 15/15]
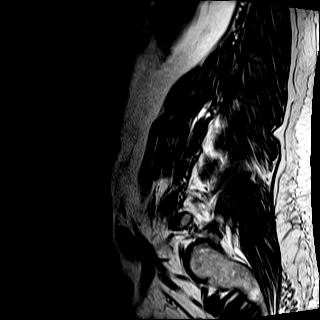

[Series 3: T2 · sagittal · 4.0mm · 0.81mm/px · 6 of 15 slices shown (1 of 2)]
[im 1/15]
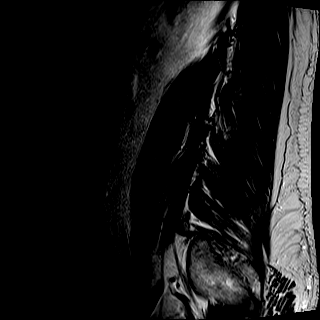
[im 3/15]
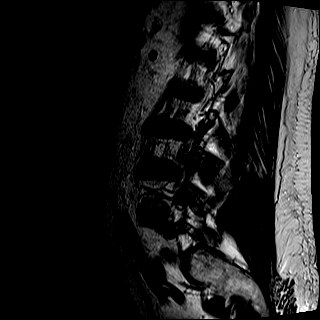
[im 6/15]
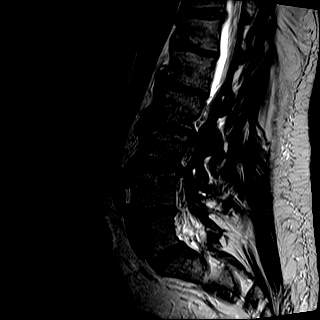
[im 9/15]
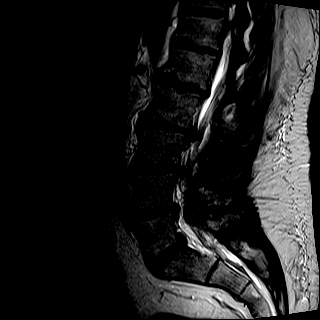
[im 12/15]
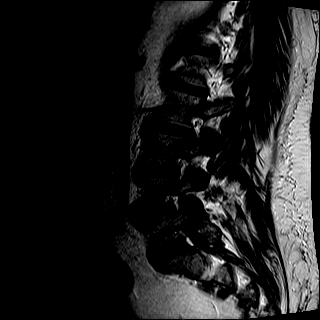
[im 15/15]
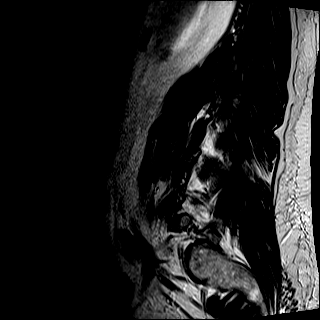

[Series 5: T2 · axial · 4.0mm · 0.39mm/px · z∈[-99,+116]mm · 9 of 37 slices shown (2 of 2)]
[im 1/37]
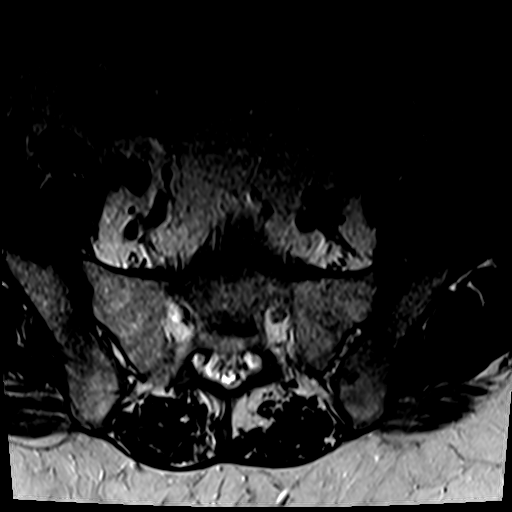
[im 6/37]
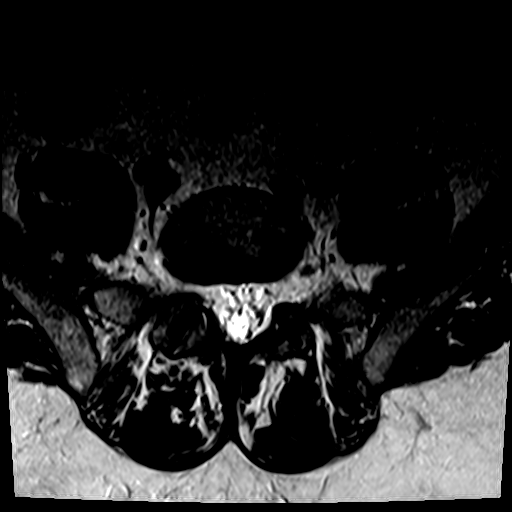
[im 11/37]
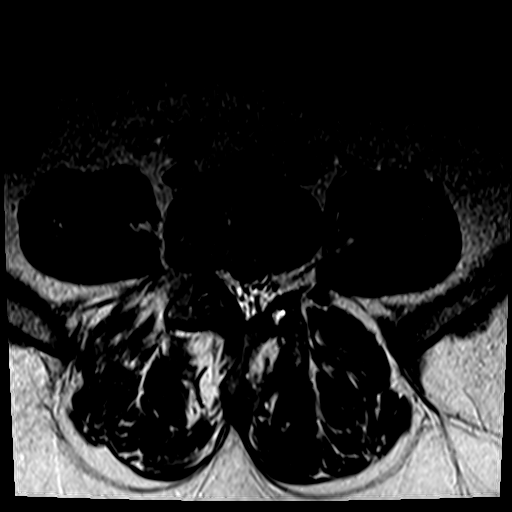
[im 16/37]
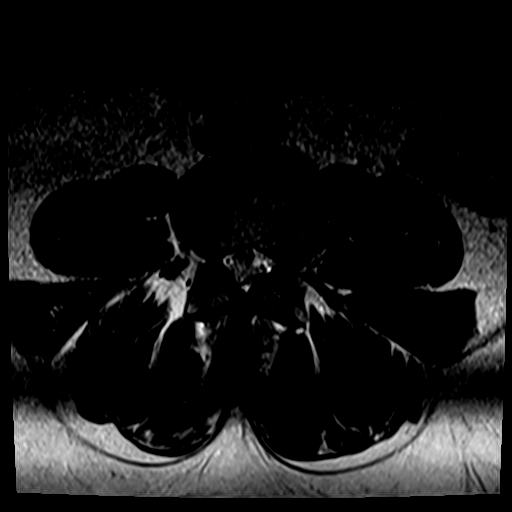
[im 19/37]
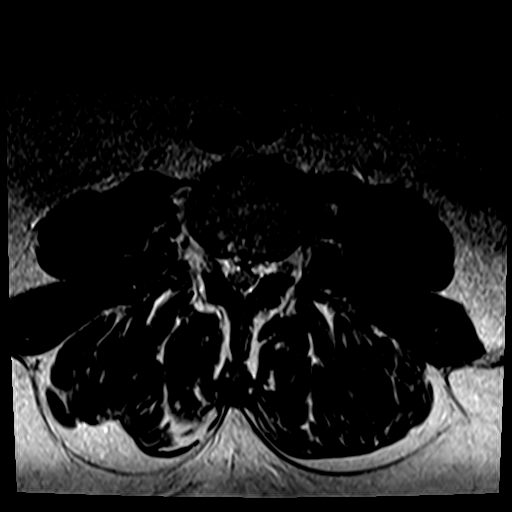
[im 21/37]
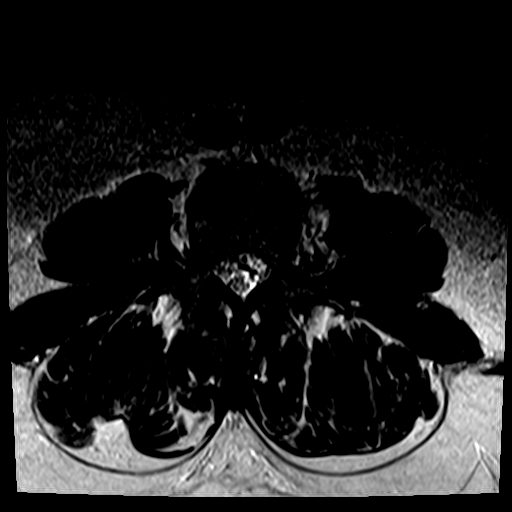
[im 26/37]
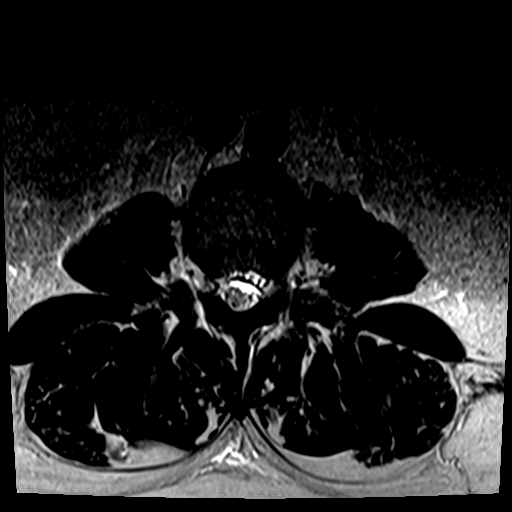
[im 31/37]
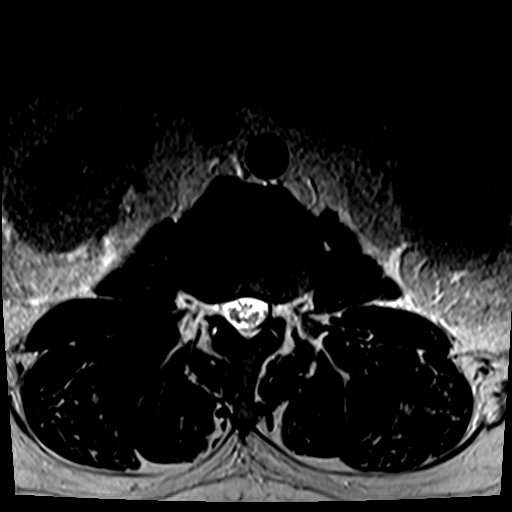
[im 37/37]
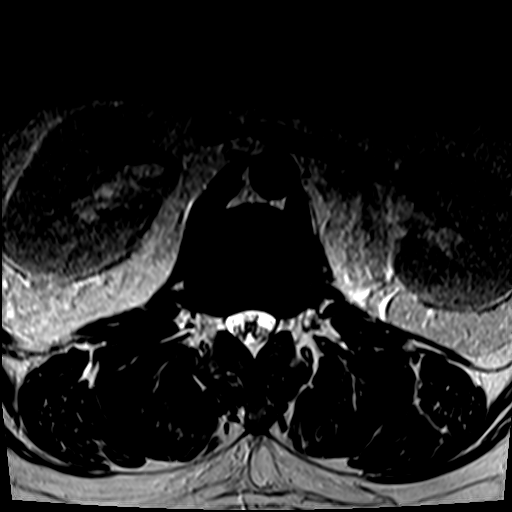

[Series 6: T1 · axial · 4.0mm · 0.39mm/px · z∈[-99,+86]mm · 4 of 37 slices shown (2 of 2)]
[im 1/37]
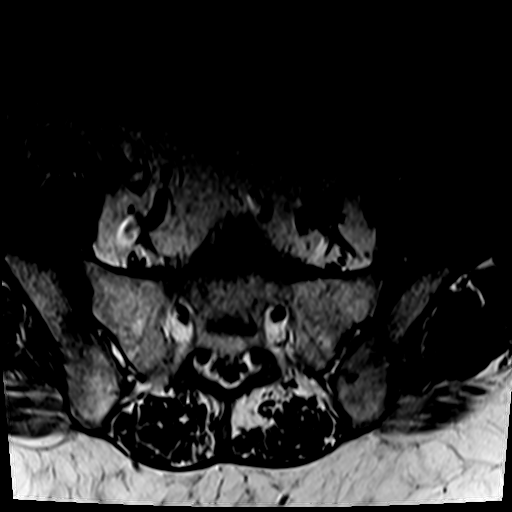
[im 6/37]
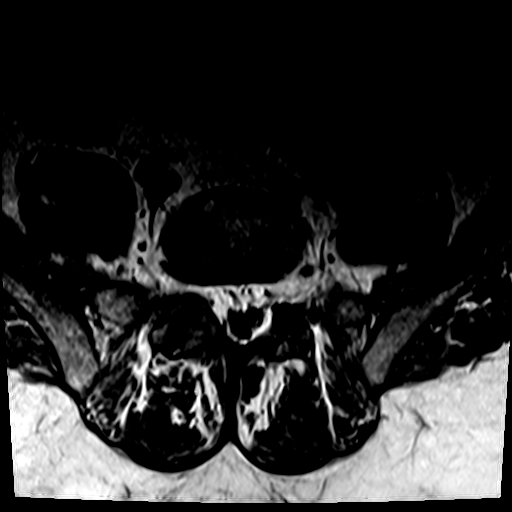
[im 19/37]
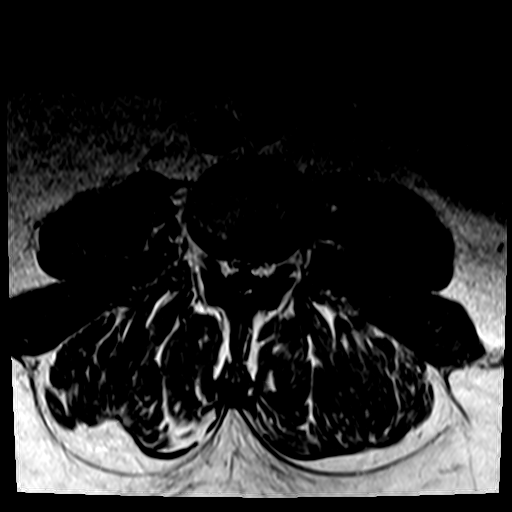
[im 31/37]
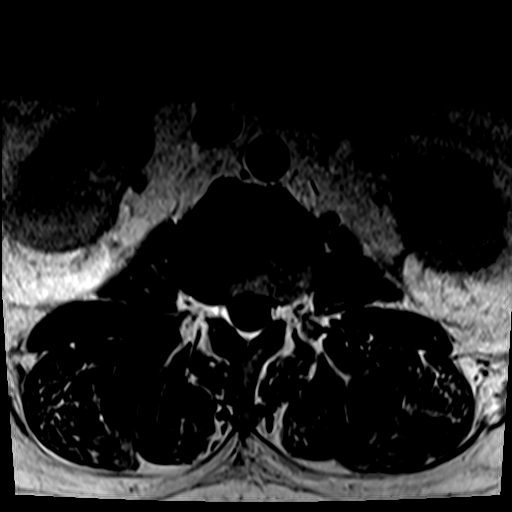

[25 of 48 positions shown; findings below may reference images not displayed]

FINDINGS: Segmentation: 5 non rib-bearing lumbar type vertebrae on the
comparison radiographs with small ribs at T12.

Alignment: Trace retrolisthesis of L1 on L2, L2 on L3, and L3 on L4.
Trace anterolisthesis of L5 on S1.

Vertebrae: Right L5 pars defect. Dysplastic left-sided posterior
elements at L4 and L5 without a normal facet joint on that side. No
acute fracture or suspicious osseous lesion. Mild multilevel
degenerative endplate changes. Minimal degenerative endplate edema
at L4-5.

Conus medullaris and cauda equina: Conus extends to the L1 level.
Cauda equina nerve root redundancy related to spinal stenosis.

Paraspinal and other soft tissues: Unremarkable.

Disc levels:

Disc desiccation from T12-L1 to L4-5 with disc space narrowing at
each level, largely moderate in severity. Congenitally short
pedicles.

L1-2: Minimal disc bulging without stenosis.

L2-3: Circumferential disc bulging, superimposed left paracentral
disc protrusion, and mild facet and ligamentum flavum hypertrophy
result in severe spinal and left greater than right lateral recess
stenosis. Patent neural foramina.

L3-4: Circumferential disc bulging, superimposed central disc
protrusion, and mild facet and ligamentum flavum hypertrophy result
in severe spinal stenosis and mild bilateral neural foraminal
stenosis.

L4-5: Dysplastic left-sided posterior elements with largely
conjoined left L4 and L5 neural foramina which are widely patent. A
central disc protrusion does not result in significant spinal
stenosis. Disc bulging, a right foraminal to extraforaminal disc
protrusion, and mild right facet hypertrophy result in moderate
right neural foraminal stenosis. Disc may affect the extraforaminal
right L4 nerve.

L5-S1: A broad right paracentral to right foraminal disc extrusion
and mild right facet hypertrophy result in moderate right neural
foraminal stenosis with potential right L5 nerve root impingement.
Patent spinal canal and left neural foramen.
IMPRESSION: 1. Congenitally short pedicles with superimposed disc and facet
degeneration resulting in severe spinal stenosis at L2-3 and L3-4.
2. Moderate right neural foraminal stenosis at L4-5 and L5-S1.
3. Dysplastic left-sided posterior elements at L4 and L5.

## 2020-09-17 ENCOUNTER — Other Ambulatory Visit: Payer: Self-pay | Admitting: Internal Medicine

## 2020-09-22 ENCOUNTER — Other Ambulatory Visit: Payer: Self-pay | Admitting: Internal Medicine

## 2020-10-17 ENCOUNTER — Encounter: Payer: Self-pay | Admitting: Internal Medicine

## 2020-10-17 ENCOUNTER — Other Ambulatory Visit: Payer: Self-pay

## 2020-10-17 ENCOUNTER — Ambulatory Visit (INDEPENDENT_AMBULATORY_CARE_PROVIDER_SITE_OTHER): Payer: 59 | Admitting: Internal Medicine

## 2020-10-17 VITALS — BP 120/72 | HR 87 | Ht 68.0 in | Wt 229.2 lb

## 2020-10-17 DIAGNOSIS — N183 Chronic kidney disease, stage 3 unspecified: Secondary | ICD-10-CM

## 2020-10-17 DIAGNOSIS — Z794 Long term (current) use of insulin: Secondary | ICD-10-CM | POA: Diagnosis not present

## 2020-10-17 DIAGNOSIS — E1165 Type 2 diabetes mellitus with hyperglycemia: Secondary | ICD-10-CM | POA: Diagnosis not present

## 2020-10-17 DIAGNOSIS — E1122 Type 2 diabetes mellitus with diabetic chronic kidney disease: Secondary | ICD-10-CM | POA: Diagnosis not present

## 2020-10-17 DIAGNOSIS — E039 Hypothyroidism, unspecified: Secondary | ICD-10-CM | POA: Diagnosis not present

## 2020-10-17 DIAGNOSIS — IMO0002 Reserved for concepts with insufficient information to code with codable children: Secondary | ICD-10-CM

## 2020-10-17 DIAGNOSIS — E7800 Pure hypercholesterolemia, unspecified: Secondary | ICD-10-CM | POA: Diagnosis not present

## 2020-10-17 LAB — POCT GLYCOSYLATED HEMOGLOBIN (HGB A1C): Hemoglobin A1C: 8 % — AB (ref 4.0–5.6)

## 2020-10-17 MED ORDER — LANTUS SOLOSTAR 100 UNIT/ML ~~LOC~~ SOPN: PEN_INJECTOR | SUBCUTANEOUS | 3 refills | Status: DC

## 2020-10-17 NOTE — Patient Instructions (Addendum)
Please continue: - Metformin 1000 mg 2x a day with meals - Jardiance 10 mg before breakfast - Trulicity 4.5 mg weekly  - Humalog 20-25 units before each meal (use 25 units for breakfast)  Please increase: - Lantus 42 units at waking up and 50 units at bedtime.  Please return in 4 months with your sugar log.

## 2020-10-17 NOTE — Progress Notes (Signed)
Subjective:     Patient ID: Mitchell Palmer, male   DOB: 02-05-1966, 55 y.o.   MRN: [id number]   This visit occurred during the SARS-CoV-2 public health emergency.  Safety protocols were in place, including screening questions prior to the visit, additional usage of staff PPE, and extensive cleaning of exam room while observing appropriate contact time as indicated for disinfecting solutions.    HPI Mitchell Palmer is a 55 y.o. man, returning for followup for DM2, dx in 2000, uncontrolled, insulin-dependent, with complications (CKD stage 3, erectile dysfunction, mild DR). Last visit 5 months ago.  He got a steroid inj in knee 2 weeks >> sugars high: 469. He get's these every ~8 mo.  Reviewed HbA1c levels: Lab Results  Component Value Date   HGBA1C 7.8 (A) 05/11/2020   HGBA1C 7.5 (A) 01/07/2020   HGBA1C 7.5 (A) 09/08/2019   HGBA1C 7.1 (A) 04/16/2019   HGBA1C 7.6 (A) 09/03/2018   HGBA1C 8.2 (A) 05/21/2018   HGBA1C 7.4 (A) 01/27/2018   HGBA1C 7.6 10/21/2017   HGBA1C 8.0 05/10/2017   HGBA1C 7.4 11/15/2016   HGBA1C 7.6 08/15/2016   HGBA1C 7.7 05/15/2016   HGBA1C 7.8 02/15/2016   HGBA1C 7.7 11/08/2015   HGBA1C 8.0 (H) 07/22/2015   HGBA1C 8.9 (H) 12/06/2014   HGBA1C 9.1 (H) 03/01/2014   HGBA1C 8.9 (H) 09/30/2013   HGBA1C 9.5 (H) 05/26/2013   HGBA1C 9.9 (H) 01/28/2013   He is on: - Metformin 1000 mg 2x a day with meals - Jardiance 10 mg before breakfast - added 73/4193 - Trulicity 1.5 >> 3 >> 4.5 mg weekly  - Basaglar >> Lantus 35 >> 42 units 2x a day - Humalog 20-25 units before each meal, but 25 units with b'fast  He was on: - he stopped  Invokana 08/2016 as he had leg tingling - Cycloset - started 04/22/2013 >> at 1.6 mg daily (tried 3 tabs >> dizziness >> backed off to 2) - Victoza 1.8 mg daily - Actos 45 mg daily - Januvia - glyburide/metformin - Avandia. - Bydureon 2 mg weekly - he hated the thick needle   He is checking his CBG 4 times a day: - am: 80s, 120-195, 220 >>  115-130 >> 125-145 >> 135-175 - 2 h after b'fast:  79-85>> 181-215 >> n/c - before lunch: 73-166 >> n/c>> 111-140 >> 140-160 >> n/c >> 125-145 - 2 h after lunch:  130-145 >> 171-184 >> 230 >> n/c - Before dinner: (4 pm): 135-150 >> 145-165 >> 150-180 (apple and banana) - nighttime: at190 after snack >> 110-125 >> 145-180 >> 145-180 Lowest: 89 >> 78 >> 80s;  He has hypoglycemia awareness in the 80s. Highest 290 (pizza) >> 469 (steroids) >> 469.  He works nights: 6 PM to 7:30 AM 3.5 days a week  Meals: - 5:30-6 pm (at work): subway sandwich >> 2 hot dogs + doritos >> chicken biscuit - snack at 9-10 pm: pistachios or 1/2 sandwich; pizza  >> 12 am: nabs +peanuts - 11 am-12 pm: salad, meat + veggies + rice (leftovers from take out), pizza rarely >> pizza  Trying to eat an apple a day.  + HL. Last lipid panel: Lab Results  Component Value Date   CHOL 146 09/02/2020   HDL 39.40 09/02/2020   LDLCALC 37 10/20/2019   LDLDIRECT 65.0 09/02/2020   TRIG 325.0 (H) 09/02/2020   CHOLHDL 4 09/02/2020  On Lipitor 40.  -+ Stage II CKD, last BUN/Cr:  Lab Results  Component Value Date  BUN 24 (H) 09/02/2020   CREATININE 1.38 09/02/2020   Lab Results  Component Value Date   MICRALBCREAT 0.9 02/15/2016   MICRALBCREAT 1.0 12/07/2014   MICRALBCREAT 0.5 11/04/2012  On olmesartan.  - last eye exam was 03/2020: + NPDR OS, without macular edema.  Dr. Katy Fitch >> now Dr. Zadie Rhine.  He had laser surgery [facility].  Also, In 07/2018 >> had EMG surgery in his R eye for bleeding. He had cataract sx (Dr. Katy Fitch), another surgery (Dr. Zadie Rhine) on R eye in 2021.  - no numbness or tingling in his legs  He also has a history of HTN, GERD, esophageal stricture, plantar fasciitis, eustachian tube dysfunction - status post ear surgery.  Hypothyroidism: -Started levothyroxine 01/2016 -He was skipping doses in the past and TFTs were fluctuating.  Pt is on levothyroxine 75 mcg daily, taken: - in am - fasting - at least  30 min from b'fast - no calcium - no iron - no multivitamins - no PPIs - not on Biotin  Reviewed his TFTs: Lab Results  Component Value Date   TSH 2.47 09/02/2020   TSH 1.85 10/20/2019   TSH 2.95 10/21/2018   TSH 5.18 (H) 09/18/2018   TSH 4.25 10/31/2017   TSH 1.17 09/21/2016   TSH 3.05 04/24/2016   TSH 4.97 (H) 03/06/2016   TSH 8.97 (H) 01/23/2016   TSH 7.07 (H) 12/06/2014   At the end of 07/2018 >> had C spine surgery.  Review of Systems Constitutional: no weight gain/no weight loss, no fatigue, no subjective hyperthermia, no subjective hypothermia Eyes: no blurry vision, no xerophthalmia ENT: no sore throat, no nodules palpated in neck, no dysphagia, no odynophagia, no hoarseness Cardiovascular: no CP/no SOB/no palpitations/no leg swelling Respiratory: no cough/no SOB/no wheezing Gastrointestinal: no N/no V/no D/no C/no acid reflux Musculoskeletal: no muscle aches/+ joint aches Skin: no rashes, no hair loss Neurological: no tremors/no numbness/no tingling/no dizziness  I reviewed pt's medications, allergies, PMH, social hx, family hx, and changes were documented in the history of present illness. Otherwise, unchanged from my initial visit note.   Past Medical History:  Diagnosis Date  . Allergy    allegic rhinitis  . Diabetes mellitus    type II  . Esophageal stricture   . GERD (gastroesophageal reflux disease)   . History of hernia repair    as a baby  . Hyperlipidemia   . Hyperplastic polyp of intestine 2010  . Hypertension   . Proliferative diabetic retinopathy of left eye ([place]) 03/31/2020   This is active as a patient still has tufts of the anterior segment neovascularization, rubeosis in the left eye every region of capillary nonperfusion should be treated with PRP.  . S/P ear surgery, follow-up exam    for drainage   Past Surgical History:  Procedure Laterality Date  . COLONOSCOPY    . ESOPHAGOGASTRODUODENOSCOPY  04/2009   stricture/GERD  . HERNIA  REPAIR     age 19-midline  . [place]   went in behind rt ear-blockage  . POLYPECTOMY    . SHOULDER ARTHROSCOPY WITH ROTATOR CUFF REPAIR AND SUBACROMIAL DECOMPRESSION Right 09/29/2012   Procedure: SHOULDER ARTHROSCOPY WITH ROTATOR CUFF REPAIR AND SUBACROMIAL DECOMPRESSION;  Surgeon: Nita Sells, MD;  Location: [place] [facility];  Service: Orthopedics;  Laterality: Right;  Right shoulder arthroscopy with subacromial decompression and distal clavicle excision & Debridement of Labrial Tear  . UPPER GASTROINTESTINAL ENDOSCOPY     Social History   Socioeconomic History  .  Marital status: Single    Spouse name: Not on file  . Number of children: Not on file  . Years of education: Not on file  . Highest education level: Not on file  Occupational History  . Occupation: Mining engineer  Tobacco Use  . Smoking status: Never Smoker  . Smokeless tobacco: Never Used  Substance and Sexual Activity  . Alcohol use: Yes    Alcohol/week: 12.0 standard drinks    Types: 12 Standard drinks or equivalent per week    Comment: 6 pk every 3 days; 12 pk/week  . Drug use: No  . Sexual activity: Not on file  Other Topics Concern  . Not on file  Social History Narrative  . Not on file   Social Determinants of Health   Financial Resource Strain: Not on file  Food Insecurity: Not on file  Transportation Needs: Not on file  Physical Activity: Not on file  Stress: Not on file  Social Connections: Not on file  Intimate Partner Violence: Not on file   Current Outpatient Medications on File Prior to Visit  Medication Sig Dispense Refill  . atorvastatin (LIPITOR) 40 MG tablet Take 1 tablet (40 mg total) by mouth daily. 30 tablet 11  . B-D INS SYR ULTRAFINE 1CC/31G 31G X 5/16" 1 ML MISC INJECT 35 UNITS INTO THE SKIN EVERY 12 HOURS AS DIRECTED BY PHYSICIAN. 200 each 2  . B-D UF III MINI PEN NEEDLES 31G X 5 MM MISC USE AS DIRECTED 4 TIMES A DAY 100 each 8  . cyclobenzaprine  (FLEXERIL) 10 MG tablet Take 1 tablet (10 mg total) by mouth 3 (three) times daily as needed. 30 tablet 3  . Dulaglutide (TRULICITY) 4.5 HA/1.9FX SOPN Inject 4.5 mg as directed once a week. 2 mL 11  . fluticasone (FLONASE) 50 MCG/ACT nasal spray SPRAY 2 SPRAYS INTO EACH NOSTRIL EVERY DAY 48 mL 3  . insulin glargine (LANTUS SOLOSTAR) 100 UNIT/ML Solostar Pen INJECT 35 UNITS IN THE MORNING AND 35 UNITS IN THE EVENING 15 mL 3  . insulin lispro (HUMALOG KWIKPEN) 100 UNIT/ML KwikPen INJECT 15-25 UNITS INTO THE SKIN 3 TIMES DAILY W/ MEALS AND 10-12 UNITS INTO THE SKIN W/ SNACKS 15 mL 3  . JARDIANCE 10 MG TABS tablet TAKE 1 TABLET BY MOUTH EVERY DAY 30 tablet 11  . levothyroxine (SYNTHROID) 75 MCG tablet TAKE 1 TABLET BY MOUTH ONCE A DAY ON AN EMPTY STOMACH 30 MINUTES BEFORE EATING. 30 tablet 11  . metFORMIN (GLUCOPHAGE) 1000 MG tablet TAKE 1 TABLET (1,000 MG TOTAL) BY MOUTH 2 (TWO) TIMES DAILY WITH A MEAL. 180 tablet 3  . mometasone (ELOCON) 0.1 % cream Apply 1 application topically daily. To affected areas 30 g 1  . olmesartan (BENICAR) 40 MG tablet Take 1 tablet (40 mg total) by mouth daily. 30 tablet 11  . omeprazole (PRILOSEC) 40 MG capsule Take 1 capsule (40 mg total) by mouth daily. 30 capsule 11  . ONETOUCH ULTRA test strip USE TO TEST BLOOD SUGAR 4 TIMES DAILY AS DIRECTED 400 strip 11  . tadalafil (CIALIS) 20 MG tablet Take 1 tablet (20 mg total) by mouth as directed. 4 tablet 5  . urea (CARMOL) 40 % CREA Add to callus each night. 85 g 2  . vitamin B-12 (CYANOCOBALAMIN) 1000 MCG tablet Take 1,000 mcg by mouth daily.     No current facility-administered medications on file prior to visit.   Allergies  Allergen Reactions  . Codeine     REACTION: anxious  Family History  Problem Relation Age of Onset  . Diabetes Mother   . Colon cancer Father 74  . Diabetes Other   . Colon polyps Brother   . Diabetes Maternal Aunt   . Cancer Cousin        breast cancer     Objective:   Physical  Exam BP 120/72   Pulse 87   Ht 5\' 8"  (1.727 m)   Wt 229 lb 3.2 oz (104 kg)   SpO2 96%   BMI 34.85 kg/m  Body mass index is 34.85 kg/m.   Wt Readings from Last 3 Encounters:  10/17/20 229 lb 3.2 oz (104 kg)  09/14/20 230 lb (104.3 kg)  05/11/20 224 lb 6.4 oz (101.8 kg)   Constitutional: overweight, in NAD Eyes: PERRLA, EOMI, no exophthalmos ENT: moist mucous membranes, no thyromegaly, no cervical lymphadenopathy Cardiovascular: RRR, No MRG Respiratory: CTA B Gastrointestinal: abdomen soft, NT, ND, BS+ Musculoskeletal: no deformities, strength intact in all 4 Skin: moist, warm, no rashes Neurological: no tremor with outstretched hands, DTR normal in all 4  Assessment:     1. 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 HL  3. Hypothyroidism    Plan:     1.  Patient with longstanding, uncontrolled, type 2 diabetes, on an complex medication regimen including oral medications (Metformin, SGLT2 inhibitor, also weekly GLP-1 receptor agonist and basal-bolus insulin regimen, with still poor control.  At last visit on HbA1c was 7.8%, slightly higher.  At that time we increased his Lantus and Trulicity dose, since his sugars were at goal when he was going to bed after work, in the morning but they were high when he woke up and also before his lunch, around midnight. -At today's visit, reviewing his blood sugars at home per his recall, sugars are higher than at last visit.  He only was able to start the high-dose Trulicity a month ago and he had a steroid injection 2 weeks ago.  Usually, with these injections, sugars increased to 400s.  He did inject 30 units of Humalog blood sugars did not decrease significantly, they only did so after 40 units.  The injection, helped, however, so he may continue to have these.  I advised him to discuss with his  orthopedic doctor whether gel injections would be feasible for him.  As of now, since sugars remain high, we will increase Lantus dose and I also advised him to take a look at his meals and see if he can improve them.  He did drink more peach alcohol in December and January of this year, now stopped.  I also advised him to try to stay more active.  We will continue the rest of his regimen. -At this visit, we discussed about the possibility of using a CGM.  I explained how this works.  She declines this for now. - I advised him to: Patient Instructions  Please continue: - Metformin 1000 mg 2x a day with meals - Jardiance 10 mg before breakfast - Trulicity 4.5 mg weekly  - Humalog 20-25 units before each meal (use 25 units for breakfast)  Please increase: - Lantus 42 units at waking up and 50 units at bedtime.  Please return in 4 months with your sugar log.   - we checked his HbA1c: 8% (higher) - advised to check sugars at different times of the day -  3x a day, rotating check times - advised for yearly eye exams >> he is UTD - return to clinic in 4 months  2. HL  -Reviewed latest lipid panel from 08/2020: LDL higher, but still at goal, triglycerides quite high: Lab Results  Component Value Date   CHOL 146 09/02/2020   HDL 39.40 09/02/2020   LDLCALC 37 10/20/2019   LDLDIRECT 65.0 09/02/2020   TRIG 325.0 (H) 09/02/2020   CHOLHDL 4 09/02/2020  -Continues Lipitor 40 without side effects  3. Hypothyroidism - latest thyroid labs reviewed with pt >> normal: Lab Results  Component Value Date   TSH 2.47 09/02/2020   - he continues on LT4 75 mcg daily - pt feels good on this dose. - we discussed about taking the thyroid hormone every day, with water, >30 minutes before breakfast, separated by >4 hours from acid reflux medications, calcium, iron, multivitamins. Pt. is taking it correctly.   Philemon Kingdom, MD PhD [facility] Endocrinology

## 2020-10-26 ENCOUNTER — Encounter (INDEPENDENT_AMBULATORY_CARE_PROVIDER_SITE_OTHER): Payer: Self-pay

## 2020-10-26 LAB — HM DIABETES EYE EXAM

## 2020-10-27 ENCOUNTER — Other Ambulatory Visit: Payer: Self-pay | Admitting: Internal Medicine

## 2021-02-22 ENCOUNTER — Encounter: Payer: Self-pay | Admitting: Internal Medicine

## 2021-02-22 ENCOUNTER — Other Ambulatory Visit: Payer: Self-pay

## 2021-02-22 ENCOUNTER — Ambulatory Visit (INDEPENDENT_AMBULATORY_CARE_PROVIDER_SITE_OTHER): Payer: 59 | Admitting: Internal Medicine

## 2021-02-22 ENCOUNTER — Telehealth: Payer: Self-pay | Admitting: Internal Medicine

## 2021-02-22 VITALS — BP 128/80 | HR 66 | Ht 68.0 in | Wt 225.0 lb

## 2021-02-22 DIAGNOSIS — E039 Hypothyroidism, unspecified: Secondary | ICD-10-CM | POA: Diagnosis not present

## 2021-02-22 DIAGNOSIS — Z794 Long term (current) use of insulin: Secondary | ICD-10-CM

## 2021-02-22 DIAGNOSIS — N183 Chronic kidney disease, stage 3 unspecified: Secondary | ICD-10-CM | POA: Diagnosis not present

## 2021-02-22 DIAGNOSIS — E1165 Type 2 diabetes mellitus with hyperglycemia: Secondary | ICD-10-CM | POA: Diagnosis not present

## 2021-02-22 DIAGNOSIS — IMO0002 Reserved for concepts with insufficient information to code with codable children: Secondary | ICD-10-CM

## 2021-02-22 DIAGNOSIS — E1122 Type 2 diabetes mellitus with diabetic chronic kidney disease: Secondary | ICD-10-CM | POA: Diagnosis not present

## 2021-02-22 DIAGNOSIS — E7800 Pure hypercholesterolemia, unspecified: Secondary | ICD-10-CM | POA: Diagnosis not present

## 2021-02-22 LAB — POCT GLYCOSYLATED HEMOGLOBIN (HGB A1C): Hemoglobin A1C: 8.1 % — AB (ref 4.0–5.6)

## 2021-02-22 MED ORDER — TIRZEPATIDE 10 MG/0.5ML ~~LOC~~ SOAJ: 10.0000 mg | SUBCUTANEOUS | 3 refills | Status: DC

## 2021-02-22 NOTE — Telephone Encounter (Signed)
Mitchell Palmer, ok, noted! Can you please send a rx for this dose of jardiance for 90 days with 3 refills? Ty! C

## 2021-02-22 NOTE — Patient Instructions (Addendum)
Please continue: - Metformin 1000 mg 2x a day with meals - Jardiance 10 mg before breakfast - Humalog 20-25 units before each meal (use 25 units for breakfast) - Lantus 42 units at waking up and 50 units at bedtime  Try to stop Trulicity and start: - Mounjaro 10 mg weekly  If Mounjaro not covered, continue Trulicity 4.5 mg weekly and let me know to send a Rx for Jardiance 25 mg daily to your pharmacy.  Please return in 3-4 months with your sugar log.

## 2021-02-22 NOTE — Addendum Note (Signed)
Addended by: Tyrone Apple on: 02/22/2021 10:34 AM   Modules accepted: Orders

## 2021-02-22 NOTE — Telephone Encounter (Signed)
Please advise 

## 2021-02-22 NOTE — Progress Notes (Signed)
Subjective:     Patient ID: Mitchell Palmer, male   DOB: 01-11-1966, 55 y.o.   MRN: [id number]   This visit occurred during the SARS-CoV-2 public health emergency.  Safety protocols were in place, including screening questions prior to the visit, additional usage of staff PPE, and extensive cleaning of exam room while observing appropriate contact time as indicated for disinfecting solutions.    HPI Mitchell Palmer is a 55 y.o. man, returning for followup for DM2, dx in 2000, uncontrolled, insulin-dependent, with complications (CKD stage 3, erectile dysfunction, mild DR). Last visit 4 months ago.  Interim history: He continues to get knee steroid injections approximately every 8 months (not since last OV).  Sugars usually increase right after an injection. No increased urination, blurry vision, nausea, chest pain.  Reviewed HbA1c levels: Lab Results  Component Value Date   HGBA1C 8.0 (A) 10/17/2020   HGBA1C 7.8 (A) 05/11/2020   HGBA1C 7.5 (A) 01/07/2020   HGBA1C 7.5 (A) 09/08/2019   HGBA1C 7.1 (A) 04/16/2019   HGBA1C 7.6 (A) 09/03/2018   HGBA1C 8.2 (A) 05/21/2018   HGBA1C 7.4 (A) 01/27/2018   HGBA1C 7.6 10/21/2017   HGBA1C 8.0 05/10/2017   HGBA1C 7.4 11/15/2016   HGBA1C 7.6 08/15/2016   HGBA1C 7.7 05/15/2016   HGBA1C 7.8 02/15/2016   HGBA1C 7.7 11/08/2015   HGBA1C 8.0 (H) 07/22/2015   HGBA1C 8.9 (H) 12/06/2014   HGBA1C 9.1 (H) 03/01/2014   HGBA1C 8.9 (H) 09/30/2013   HGBA1C 9.5 (H) 05/26/2013   He is on: - Metformin 1000 mg 2x a day with meals - Jardiance 10 mg before breakfast - added 29/9242 - Trulicity 1.5 >> 3 >> 4.5 mg weekly  - Lantus 35 >> 42 units 2x a day >> 42 units at waking up and 50 units at bedtime - Humalog 20-25 units before each meal, but 25 units with b'fast  He was on: - he stopped  Invokana 08/2016 as he had leg tingling - Cycloset - started 04/22/2013 >> at 1.6 mg daily (tried 3 tabs >> dizziness >> backed off to 2) - Victoza 1.8 mg daily - Actos 45 mg  daily - Januvia - glyburide/metformin - Avandia. - Bydureon 2 mg weekly - he hated the thick needle   He is checking his CBG 2-3 times a day: - am:  115-130 >> 125-145 >> 135-175 >> waking up: 125-165 - 2 h after b'fast:  79-85 >> 181-215 >> n/c - before lunch: 111-140 >> 140-160 >> n/c >> 125-145 >> 155-200 - 2 h after lunch:  130-145 >> 171-184 >> 230 >> n/c - Before dinner: (4 pm): 135-150 >> 145-165 >> 150-180 >> 77, 150-200s - nighttime: 190 >> 110-125 >> 145-180 >> 145-180 >> bedtime: n/c Lowest: 78 >> 80s >> 77;  He has hypoglycemia awareness in the 80s. Highest 469 (steroids) >> 469 >> 329 (big mac and fries).  He works nights: 6 PM to 7:30 AM 3-4 days a week.  Meals: - 5:30-6 pm (at work): subway sandwich >> 2 hot dogs + doritos >> chicken biscuit - snack at 9-10 pm: pistachios or 1/2 sandwich; pizza  >> 12 am: nabs +peanuts, apples - 11 am-12 pm: salad, meat + veggies + rice (leftovers from take out), pizza rarely  + HL. Last lipid panel: Lab Results  Component Value Date   CHOL 146 09/02/2020   HDL 39.40 09/02/2020   LDLCALC 37 10/20/2019   LDLDIRECT 65.0 09/02/2020   TRIG 325.0 (H) 09/02/2020   CHOLHDL 4  09/02/2020  On Lipitor 40.  -+ Stage II CKD, last BUN/Cr:  Lab Results  Component Value Date   BUN 24 (H) 09/02/2020   CREATININE 1.38 09/02/2020   Lab Results  Component Value Date   MICRALBCREAT 0.9 02/15/2016   MICRALBCREAT 1.0 12/07/2014   MICRALBCREAT 0.5 11/04/2012  On olmesartan.  - last eye exam was 03/2020: + NPDR OS, without macular edema.  Dr. Katy Fitch >> now Dr. Zadie Rhine.  He had laser surgery [facility].  Also, In 07/2018 >> had EMG surgery in his R eye for bleeding. He had cataract sx (Dr. Katy Fitch), another surgery (Dr. Zadie Rhine) on R eye in 2021.  - no numbness or tingling in his legs  He also has a history of HTN, GERD, esophageal stricture, plantar fasciitis, eustachian tube dysfunction - status post ear surgery.  Hypothyroidism: -Started  levothyroxine 01/2016 -He was skipping doses in the past and TFTs were fluctuating.  Pt is on levothyroxine 75 mcg daily, taken: - in am - fasting - at least 30 min from b'fast - no calcium - no iron - no multivitamins - no PPIs - not on Biotin  Reviewed his TFTs: Lab Results  Component Value Date   TSH 2.47 09/02/2020   TSH 1.85 10/20/2019   TSH 2.95 10/21/2018   TSH 5.18 (H) 09/18/2018   TSH 4.25 10/31/2017   TSH 1.17 09/21/2016   TSH 3.05 04/24/2016   TSH 4.97 (H) 03/06/2016   TSH 8.97 (H) 01/23/2016   TSH 7.07 (H) 12/06/2014   In 07/2018, he had C spine surgery.  Review of Systems Constitutional: no weight gain/no weight loss, no fatigue, no subjective hyperthermia, no subjective hypothermia Eyes: no blurry vision, no xerophthalmia ENT: no sore throat, no nodules palpated in neck, no dysphagia, no odynophagia, no hoarseness Cardiovascular: no CP/no SOB/no palpitations/no leg swelling Respiratory: no cough/no SOB/no wheezing Gastrointestinal: no N/no V/no D/no C/no acid reflux Musculoskeletal: no muscle aches/+ joint aches Skin: no rashes, no hair loss Neurological: no tremors/no numbness/no tingling/no dizziness  I reviewed pt's medications, allergies, PMH, social hx, family hx, and changes were documented in the history of present illness. Otherwise, unchanged from my initial visit note.  Past Medical History:  Diagnosis Date   Allergy    allegic rhinitis   Diabetes mellitus    type II   Esophageal stricture    GERD (gastroesophageal reflux disease)    History of hernia repair    as a baby   Hyperlipidemia    Hyperplastic polyp of intestine 2010   Hypertension    Proliferative diabetic retinopathy of left eye ([place]) 03/31/2020   This is active as a patient still has tufts of the anterior segment neovascularization, rubeosis in the left eye every region of capillary nonperfusion should be treated with PRP.   S/P ear surgery, follow-up exam    for drainage    Past Surgical History:  Procedure Laterality Date   COLONOSCOPY     ESOPHAGOGASTRODUODENOSCOPY  04/2009   stricture/GERD   HERNIA REPAIR     age [address]   went in behind rt ear-blockage   POLYPECTOMY     SHOULDER ARTHROSCOPY WITH ROTATOR CUFF REPAIR AND SUBACROMIAL DECOMPRESSION Right 09/29/2012   Procedure: SHOULDER ARTHROSCOPY WITH ROTATOR CUFF REPAIR AND SUBACROMIAL DECOMPRESSION;  Surgeon: Nita Sells, MD;  Location: [facility];  Service: Orthopedics;  Laterality: Right;  Right shoulder arthroscopy with subacromial decompression and distal clavicle excision & Debridement of Labrial Tear  UPPER GASTROINTESTINAL ENDOSCOPY     Social History   Socioeconomic History   Marital status: Single    Spouse name: Not on file   Number of children: Not on file   Years of education: Not on file   Highest education level: Not on file  Occupational History   Occupation: Mining engineer  Tobacco Use   Smoking status: Never   Smokeless tobacco: Never  Substance and Sexual Activity   Alcohol use: Yes    Alcohol/week: 12.0 standard drinks    Types: 12 Standard drinks or equivalent per week    Comment: 6 pk every 3 days; 12 pk/week   Drug use: No   Sexual activity: Not on file  Other Topics Concern   Not on file  Social History Narrative   Not on file   Social Determinants of Health   Financial Resource Strain: Not on file  Food Insecurity: Not on file  Transportation Needs: Not on file  Physical Activity: Not on file  Stress: Not on file  Social Connections: Not on file  Intimate Partner Violence: Not on file   Current Outpatient Medications on File Prior to Visit  Medication Sig Dispense Refill   atorvastatin (LIPITOR) 40 MG tablet Take 1 tablet (40 mg total) by mouth daily. 30 tablet 11   B-D INS SYR ULTRAFINE 1CC/31G 31G X 5/16" 1 ML MISC INJECT 35 UNITS INTO THE SKIN EVERY 12 HOURS AS DIRECTED BY PHYSICIAN. 200 each 2   B-D  UF III MINI PEN NEEDLES 31G X 5 MM MISC USE AS DIRECTED 4 TIMES A DAY 100 each 8   cyclobenzaprine (FLEXERIL) 10 MG tablet Take 1 tablet (10 mg total) by mouth 3 (three) times daily as needed. 30 tablet 3   Dulaglutide (TRULICITY) 4.5 LK/5.6YB SOPN Inject 4.5 mg as directed once a week. 2 mL 11   fluticasone (FLONASE) 50 MCG/ACT nasal spray SPRAY 2 SPRAYS INTO EACH NOSTRIL EVERY DAY 48 mL 3   insulin glargine (LANTUS SOLOSTAR) 100 UNIT/ML Solostar Pen INJECT 42 UNITS IN THE MORNING AND 50 UNITS IN THE EVENING 45 mL 3   insulin lispro (HUMALOG KWIKPEN) 100 UNIT/ML KwikPen INJECT 15-25 UNITS INTO THE SKIN 3 TIMES DAILY W/ MEALS AND 10-12 UNITS INTO THE SKIN W/ SNACKS 15 mL 3   JARDIANCE 10 MG TABS tablet TAKE 1 TABLET BY MOUTH EVERY DAY 30 tablet 11   levothyroxine (SYNTHROID) 75 MCG tablet TAKE 1 TABLET BY MOUTH ONCE A DAY ON AN EMPTY STOMACH 30 MINUTES BEFORE EATING. 30 tablet 11   metFORMIN (GLUCOPHAGE) 1000 MG tablet TAKE 1 TABLET (1,000 MG TOTAL) BY MOUTH 2 (TWO) TIMES DAILY WITH A MEAL. 180 tablet 3   mometasone (ELOCON) 0.1 % cream Apply 1 application topically daily. To affected areas 30 g 1   olmesartan (BENICAR) 40 MG tablet Take 1 tablet (40 mg total) by mouth daily. 30 tablet 11   omeprazole (PRILOSEC) 40 MG capsule Take 1 capsule (40 mg total) by mouth daily. 30 capsule 11   ONETOUCH ULTRA test strip USE TO TEST BLOOD SUGAR 4 TIMES DAILY AS DIRECTED 400 strip 11   tadalafil (CIALIS) 20 MG tablet Take 1 tablet (20 mg total) by mouth as directed. 4 tablet 5   urea (CARMOL) 40 % CREA Add to callus each night. 85 g 2   vitamin B-12 (CYANOCOBALAMIN) 1000 MCG tablet Take 1,000 mcg by mouth daily.     No current facility-administered medications on file prior to visit.  Allergies  Allergen Reactions   Codeine     REACTION: anxious   Family History  Problem Relation Age of Onset   Diabetes Mother    Colon cancer Father 45   Diabetes Other    Colon polyps Brother    Diabetes Maternal  Aunt    Cancer Cousin        breast cancer     Objective:   Physical Exam BP 128/80 (BP Location: Left Arm, Patient Position: Sitting, Cuff Size: Normal)   Pulse 66   Ht _0  (1.727 m)   Wt 225 lb (102.1 kg)   SpO2 98%   BMI 34.21 kg/m  Body mass index is 34.21 kg/m.   Wt Readings from Last 3 Encounters:  02/22/21 225 lb (102.1 kg)  10/17/20 229 lb 3.2 oz (104 kg)  09/14/20 230 lb (104.3 kg)   Constitutional: overweight, in NAD Eyes: PERRLA, EOMI, no exophthalmos ENT: moist mucous membranes, no thyromegaly, no cervical lymphadenopathy Cardiovascular: RRR, No MRG Respiratory: CTA B Gastrointestinal: abdomen soft, NT, ND, BS+ Musculoskeletal: no deformities, strength intact in all 4 Skin: moist, warm, no rashes Neurological: no tremor with outstretched hands, DTR normal in all 4  Assessment:     1. 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 HL  3. Hypothyroidism    Plan:     1.  Patient with longstanding, uncontrolled, type 2 diabetes, on a complex medication regimen including oral medications (metformin, SGLT2 inhibitor), also weekly GLP-1 receptor agonist and basal-bolus insulin regimen, with the long-acting insulin dose increased at last visit.  His HbA1c at that time was higher, at 8.0%.  Sugars at home were higher, despite increasing the Trulicity dose to the maximum approved dose of 4.5 mg weekly a month prior to the appointment.  No significant impediment to his diabetes control is his need for steroid injections.  Usually sugars increased to 400s after an injection.  He is increasing the Humalog dose but this is not fully effective.  At that time, I advised him to increase his Lantus dose at bedtime to improve his sugars at waking up, which were quite high at last visit.  He did describe at that time that he had more peach alcohol  which he stopped before our last visit.  We also discussed about staying more active.  I discussed with him about starting a CGM, but he declined. -At today's visit, sugars remain high, above target most of the time.  He has an occasional blood sugar at target, but inconsistently without any patterns.  He did not improve his diet since last visit.  He is already on the maximal dose of Trulicity and he is taking high doses of Lantus and Humalog.  He is taking a half maximal dose of Jardiance and we discussed about possibly increasing the dose to 25 mg daily, the target dose.  For now, however, I would prefer that he changed from Trulicity to [facility], which I hope is covered by his insurance.  Discussed about benefits and possible side effects.  He agrees to try this. - I advised him to: Patient Instructions  Please continue: - Metformin 1000 mg 2x a day with meals - Jardiance 10 mg before breakfast - Humalog 20-25 units before each meal (use 25 units for breakfast) - Lantus 42 units at waking up and 50 units at bedtime  Try  to stop Trulicity and start: - Mounjaro 10 mg weekly  If Mounjaro not covered, continue Trulicity 4.5 mg weekly and let me know to send a Rx for Jardiance 25 mg daily to your pharmacy.  Please return in 3-4 months with your sugar log.   - we checked his HbA1c: 8.1% (higher) - advised to check sugars at different times of the day - 4x a day, rotating check times - advised for yearly eye exams >> he is UTD - lost 4 lbs since last OV - return to clinic in 3-4 months  2. HL  -Reviewed latest lipid panel from 08/2020: LDL higher, but still at goal, triglycerides quite high: Lab Results  Component Value Date   CHOL 146 09/02/2020   HDL 39.40 09/02/2020   LDLCALC 37 10/20/2019   LDLDIRECT 65.0 09/02/2020   TRIG 325.0 (H) 09/02/2020   CHOLHDL 4 09/02/2020  -Continues Lipitor 40 mg daily without side effects  3. Hypothyroidism - latest thyroid labs reviewed with pt. >>  normal: Lab Results  Component Value Date   TSH 2.47 09/02/2020  - he continues on LT4 75 mcg daily - pt feels good on this dose. - we discussed about taking the thyroid hormone every day, with water, >30 minutes before breakfast, separated by >4 hours from acid reflux medications, calcium, iron, multivitamins. Pt. is taking it correctly.  Philemon Kingdom, MD PhD [facility]

## 2021-02-22 NOTE — Telephone Encounter (Signed)
Patient called to advise that Mounjaro not covered by his insurance.  Needs Rx for Jardiance 25 mg to be sent to the [facility] on [address] on [address]

## 2021-02-24 ENCOUNTER — Telehealth: Payer: Self-pay

## 2021-02-24 MED ORDER — EMPAGLIFLOZIN 10 MG PO TABS: 10.0000 mg | ORAL_TABLET | Freq: Every day | ORAL | 0 refills | Status: DC

## 2021-02-24 MED ORDER — EMPAGLIFLOZIN 10 MG PO TABS: 10.0000 mg | ORAL_TABLET | Freq: Every day | ORAL | 11 refills | Status: DC

## 2021-02-24 NOTE — Telephone Encounter (Signed)
Patient called stating his jardiance was supposed to be sent for 25 mg and not 10 mg - could we re send this to the [facility] in [place]?

## 2021-02-24 NOTE — Telephone Encounter (Signed)
Sent the pharmacy

## 2021-02-28 NOTE — Telephone Encounter (Signed)
Yes, please see my message for Mitchell Palmer - He needed the 25 mg sent!  Let's in 90 tablets with 3 refills.

## 2021-03-01 ENCOUNTER — Other Ambulatory Visit: Payer: Self-pay

## 2021-03-01 DIAGNOSIS — E1165 Type 2 diabetes mellitus with hyperglycemia: Secondary | ICD-10-CM

## 2021-03-01 DIAGNOSIS — E1122 Type 2 diabetes mellitus with diabetic chronic kidney disease: Secondary | ICD-10-CM

## 2021-03-01 DIAGNOSIS — IMO0002 Reserved for concepts with insufficient information to code with codable children: Secondary | ICD-10-CM

## 2021-03-01 MED ORDER — EMPAGLIFLOZIN 25 MG PO TABS
25.0000 mg | ORAL_TABLET | Freq: Every day | ORAL | 3 refills | Status: DC
Start: 2021-03-01 — End: 2022-02-19

## 2021-04-01 ENCOUNTER — Other Ambulatory Visit: Payer: Self-pay | Admitting: Internal Medicine

## 2021-04-12 ENCOUNTER — Encounter: Payer: Self-pay | Admitting: Family Medicine

## 2021-04-12 ENCOUNTER — Other Ambulatory Visit: Payer: Self-pay

## 2021-04-12 ENCOUNTER — Ambulatory Visit: Payer: 59 | Admitting: Family Medicine

## 2021-04-12 VITALS — BP 138/86 | HR 88 | Temp 97.8°F | Ht 68.0 in | Wt 226.2 lb

## 2021-04-12 DIAGNOSIS — I10 Essential (primary) hypertension: Secondary | ICD-10-CM

## 2021-04-12 DIAGNOSIS — E1169 Type 2 diabetes mellitus with other specified complication: Secondary | ICD-10-CM | POA: Diagnosis not present

## 2021-04-12 DIAGNOSIS — E039 Hypothyroidism, unspecified: Secondary | ICD-10-CM | POA: Diagnosis not present

## 2021-04-12 DIAGNOSIS — H9201 Otalgia, right ear: Secondary | ICD-10-CM | POA: Insufficient documentation

## 2021-04-12 DIAGNOSIS — E1122 Type 2 diabetes mellitus with diabetic chronic kidney disease: Secondary | ICD-10-CM

## 2021-04-12 DIAGNOSIS — E785 Hyperlipidemia, unspecified: Secondary | ICD-10-CM

## 2021-04-12 DIAGNOSIS — J301 Allergic rhinitis due to pollen: Secondary | ICD-10-CM

## 2021-04-12 DIAGNOSIS — E1165 Type 2 diabetes mellitus with hyperglycemia: Secondary | ICD-10-CM

## 2021-04-12 DIAGNOSIS — R7989 Other specified abnormal findings of blood chemistry: Secondary | ICD-10-CM

## 2021-04-12 DIAGNOSIS — IMO0002 Reserved for concepts with insufficient information to code with codable children: Secondary | ICD-10-CM

## 2021-04-12 DIAGNOSIS — Z794 Long term (current) use of insulin: Secondary | ICD-10-CM

## 2021-04-12 DIAGNOSIS — N183 Chronic kidney disease, stage 3 unspecified: Secondary | ICD-10-CM

## 2021-04-12 NOTE — Progress Notes (Signed)
Subjective:    Patient ID: Mitchell Palmer, male    DOB: 01/27/1966, 55 y.o.   MRN: [id number]  This visit occurred during the SARS-CoV-2 public health emergency.  Safety protocols were in place, including screening questions prior to the visit, additional usage of staff PPE, and extensive cleaning of exam room while observing appropriate contact time as indicated for disinfecting solutions.   HPI Pt presents for f/u of chronic health problems including HTN and hyperlipidemia and hypothyroidism  Wt Readings from Last 3 Encounters:  04/12/21 226 lb 3 oz (102.6 kg)  02/22/21 225 lb (102.1 kg)  10/17/20 229 lb 3.2 oz (104 kg)   34.39 kg/m  Had a tooth pulled yesterday - recovering from that  Cavities filled before that  R ear is bothering him /hurts (tooth was molar on R)   A little congested  No sinus pain  Swimming earlier in the summer  A little jaw pain    HTN bp is stable today  No cp or palpitations or headaches or edema  No side effects to medicines  BP Readings from Last 3 Encounters:  04/12/21 138/86  02/22/21 128/80  10/17/20 120/72     Benicar 40 mg daily   Pulse Readings from Last 3 Encounters:  04/12/21 88  02/22/21 66  10/17/20 87   DM2 Sees endocrine With retinopathy This time a1c was over 8  More sweets at that time, now has dialed back    Hypothyroidism  Pt has no clinical changes No change in energy level/ hair or skin/ edema and no tremor Lab Results  Component Value Date   TSH 2.47 09/02/2020     Levothyroxine 75 mcg daily   Hyperlipidemia Lab Results  Component Value Date   CHOL 146 09/02/2020   HDL 39.40 09/02/2020   LDLCALC 37 10/20/2019   LDLDIRECT 65.0 09/02/2020   TRIG 325.0 (H) 09/02/2020   CHOLHDL 4 09/02/2020   Atorvastatin 40 mg daily  Would like to change this  Is fatigued and has muscle pain  Not a lot of exercise- makes him too sore     Last visit low D level 22.1 Started vit D3 2000 iu daily   Patient  Active Problem List   Diagnosis Date Noted   Right ear pain 04/12/2021   Low vitamin D level 09/14/2020   Prostate cancer screening 09/02/2020   Current use of proton pump inhibitor 09/02/2020   Family history of factor V Leiden mutation 05/30/2020   Left ankle pain 01/29/2020   Stable treated proliferative diabetic retinopathy of right eye determined by examination associated with type 2 diabetes mellitus ([place]) 12/24/2019   Stable treated proliferative diabetic retinopathy of left eye determined by examination associated with type 2 diabetes mellitus ([place]) 12/24/2019   Vitreous hemorrhage of right eye ([place]) 12/24/2019   Nuclear sclerotic cataract of left eye 12/24/2019   Low testosterone 11/13/2019   Boil of groin 11/10/2019   Fatigue 10/20/2019   Exposure to COVID-19 virus 12/03/2018   ETD (eustachian tube dysfunction) 10/16/2018   Neck pain 04/28/2018   Need for hepatitis B screening test 03/26/2018   Need for hepatitis C screening test 03/26/2018   Paresthesia of both feet 11/01/2017   Hemorrhoids 10/14/2017   BMI 34.0-34.9,adult 01/24/2016   Hypothyroidism 01/24/2016   Uncontrolled type 2 diabetes mellitus with stage 3 chronic kidney disease, with long-term current use of insulin ([place]) 07/28/2015   Alcohol consumption heavy 12/06/2014   Necrobiosis diabeticorum ([place]) 03/01/2014   Routine general  medical examination at a health care facility 09/30/2013   Low back pain 06/17/2012   Abnormal chest x-ray 10/15/2011   PLANTAR FASCIITIS, TRAUMATIC [id number]   NEOPLASM UNCERTAIN BEHAVIOR OTHER SPEC SITES 10/07/2009   ESOPHAGEAL STRICTURE 06/27/2009   GERD 03/31/2009   Hyperlipidemia associated with type 2 diabetes mellitus ([place]) 01/06/2009   Essential hypertension 01/06/2009   Allergic rhinitis 01/06/2009   Past Medical History:  Diagnosis Date   Allergy    allegic rhinitis   Diabetes mellitus    type II   Esophageal stricture    GERD (gastroesophageal reflux disease)     History of hernia repair    as a baby   Hyperlipidemia    Hyperplastic polyp of intestine 2010   Hypertension    Proliferative diabetic retinopathy of left eye ([place]) 03/31/2020   This is active as a patient still has tufts of the anterior segment neovascularization, rubeosis in the left eye every region of capillary nonperfusion should be treated with PRP.   S/P ear surgery, follow-up exam    for drainage   Past Surgical History:  Procedure Laterality Date   COLONOSCOPY     ESOPHAGOGASTRODUODENOSCOPY  04/2009   stricture/GERD   HERNIA REPAIR     age [place]   went in behind rt ear-blockage   POLYPECTOMY     SHOULDER ARTHROSCOPY WITH ROTATOR CUFF REPAIR AND SUBACROMIAL DECOMPRESSION Right 09/29/2012   Procedure: SHOULDER ARTHROSCOPY WITH ROTATOR CUFF REPAIR AND SUBACROMIAL DECOMPRESSION;  Surgeon: Nita Sells, MD;  Location: [facility];  Service: Orthopedics;  Laterality: Right;  Right shoulder arthroscopy with subacromial decompression and distal clavicle excision & Debridement of Labrial Tear   UPPER GASTROINTESTINAL ENDOSCOPY     Social History   Tobacco Use   Smoking status: Never   Smokeless tobacco: Never  Substance Use Topics   Alcohol use: Yes    Alcohol/week: 12.0 standard drinks    Types: 12 Standard drinks or equivalent per week    Comment: 6 pk every 3 days; 12 pk/week   Drug use: No   Family History  Problem Relation Age of Onset   Diabetes Mother    Colon cancer Father 23   Diabetes Other    Colon polyps Brother    Diabetes Maternal Aunt    Cancer Cousin        breast cancer   Allergies  Allergen Reactions   Codeine     REACTION: anxious   Current Outpatient Medications on File Prior to Visit  Medication Sig Dispense Refill   atorvastatin (LIPITOR) 40 MG tablet Take 1 tablet (40 mg total) by mouth daily. 30 tablet 11   B-D INS SYR ULTRAFINE 1CC/31G 31G X 5/16" 1 ML MISC INJECT 35 UNITS INTO THE  SKIN EVERY 12 HOURS AS DIRECTED BY PHYSICIAN. 200 each 2   B-D UF III MINI PEN NEEDLES 31G X 5 MM MISC USE AS DIRECTED 4 TIMES A DAY 100 each 8   cyclobenzaprine (FLEXERIL) 10 MG tablet Take 1 tablet (10 mg total) by mouth 3 (three) times daily as needed. 30 tablet 3   Dulaglutide (TRULICITY) 4.5 0000000 SOPN Inject 4.5 mg as directed once a week. 2 mL 11   empagliflozin (JARDIANCE) 25 MG TABS tablet Take 1 tablet (25 mg total) by mouth daily before breakfast. 90 tablet 3   fluticasone (FLONASE) 50 MCG/ACT nasal spray SPRAY 2 SPRAYS INTO EACH NOSTRIL EVERY DAY 48 mL 3   insulin  glargine (LANTUS SOLOSTAR) 100 UNIT/ML Solostar Pen INJECT 42 UNITS IN THE MORNING AND 50 UNITS IN THE EVENING 45 mL 3   insulin lispro (HUMALOG KWIKPEN) 100 UNIT/ML KwikPen INJECT 15-25 UNITS INTO THE SKIN 3 TIMES DAILY W/ MEALS AND 10-12 UNITS INTO THE SKIN W/ SNACKS 30 mL 1   levothyroxine (SYNTHROID) 75 MCG tablet TAKE 1 TABLET BY MOUTH ONCE A DAY ON AN EMPTY STOMACH 30 MINUTES BEFORE EATING. 30 tablet 11   metFORMIN (GLUCOPHAGE) 1000 MG tablet TAKE 1 TABLET (1,000 MG TOTAL) BY MOUTH 2 (TWO) TIMES DAILY WITH A MEAL. 180 tablet 3   mometasone (ELOCON) 0.1 % cream Apply 1 application topically daily. To affected areas 30 g 1   olmesartan (BENICAR) 40 MG tablet Take 1 tablet (40 mg total) by mouth daily. 30 tablet 11   omeprazole (PRILOSEC) 40 MG capsule Take 1 capsule (40 mg total) by mouth daily. 30 capsule 11   ONETOUCH ULTRA test strip USE TO TEST BLOOD SUGAR 4 TIMES DAILY AS DIRECTED 400 strip 11   tadalafil (CIALIS) 20 MG tablet Take 1 tablet (20 mg total) by mouth as directed. 4 tablet 5   vitamin B-12 (CYANOCOBALAMIN) 1000 MCG tablet Take 1,000 mcg by mouth daily.     No current facility-administered medications on file prior to visit.    . Review of Systems  Constitutional:  Positive for fatigue. Negative for activity change, appetite change, fever and unexpected weight change.  HENT:  Negative for congestion,  rhinorrhea, sore throat and trouble swallowing.   Eyes:  Negative for pain, redness, itching and visual disturbance.  Respiratory:  Negative for cough, chest tightness, shortness of breath and wheezing.   Cardiovascular:  Negative for chest pain and palpitations.  Gastrointestinal:  Negative for abdominal pain, blood in stool, constipation, diarrhea and nausea.  Endocrine: Negative for cold intolerance, heat intolerance, polydipsia and polyuria.  Genitourinary:  Negative for difficulty urinating, dysuria, frequency and urgency.  Musculoskeletal:  Positive for myalgias. Negative for arthralgias and joint swelling.  Skin:  Negative for pallor and rash.  Neurological:  Negative for dizziness, tremors, weakness, numbness and headaches.  Hematological:  Negative for adenopathy. Does not bruise/bleed easily.  Psychiatric/Behavioral:  Negative for decreased concentration and dysphoric mood. The patient is not nervous/anxious.       Objective:   Physical Exam Constitutional:      General: He is not in acute distress.    Appearance: Normal appearance. He is well-developed. He is obese. He is not ill-appearing.  HENT:     Head: Normocephalic and atraumatic.     Right Ear: Tympanic membrane, ear canal and external ear normal. There is no impacted cerumen.     Left Ear: Tympanic membrane, ear canal and external ear normal. There is no impacted cerumen.     Nose:     Comments: Boggy nares Eyes:     Conjunctiva/sclera: Conjunctivae normal.     Pupils: Pupils are equal, round, and reactive to light.  Neck:     Thyroid: No thyromegaly.     Vascular: No carotid bruit or JVD.  Cardiovascular:     Rate and Rhythm: Normal rate and regular rhythm.     Heart sounds: Normal heart sounds.    No gallop.  Pulmonary:     Effort: Pulmonary effort is normal. No respiratory distress.     Breath sounds: Normal breath sounds. No wheezing or rales.  Abdominal:     General: Bowel sounds are normal. There is no  distension or abdominal bruit.     Palpations: Abdomen is soft. There is no mass.     Tenderness: There is no abdominal tenderness.  Musculoskeletal:     Cervical back: Normal range of motion and neck supple.     Right lower leg: No edema.     Left lower leg: No edema.  Lymphadenopathy:     Cervical: No cervical adenopathy.  Skin:    General: Skin is warm and dry.     Coloration: Skin is not pale.     Findings: No erythema or rash.  Neurological:     Mental Status: He is alert.     Coordination: Coordination normal.     Deep Tendon Reflexes: Reflexes are normal and symmetric. Reflexes normal.     Comments: No tremor  Psychiatric:        Mood and Affect: Mood normal.          Assessment & Plan:   Problem List Items Addressed This Visit       Cardiovascular and Mediastinum   Essential hypertension    bp is stable today  No cp or palpitations or headaches or edema  No side effects to medicines  BP Readings from Last 3 Encounters:  04/12/21 138/86  02/22/21 128/80  10/17/20 120/72     Plan to continue benicar 40 mg daily         Respiratory   Allergic rhinitis    For congestion enc to stay on flonase daily through the allergy season         Endocrine   Hyperlipidemia associated with type 2 diabetes mellitus ([place]) - Primary    Disc goals for lipids and reasons to control them Rev last labs with pt Rev low sat fat diet in detail  LDL well controlled at 37 Intolerant of atorvastatin / fatigue and myalgia inst to hold for 2 wk and update  If it is a true side eff will try low dose crestor or intermittent Also trial of co Q 10 Discussed diet  Disc imp of statin in diabetes       Uncontrolled type 2 diabetes mellitus with stage 3 chronic kidney disease, with long-term current use of insulin ([facility])    Under care of endocrinology  Last a1c over 8 Cutting back on sweets now  On arb and statin        Hypothyroidism    Hypothyroidism  Pt has no clinical  changes No change in energy level/ hair or skin/ edema and no tremor Lab Results  Component Value Date   TSH 2.47 09/02/2020    Continues levothyroxine 75 mcg daily        Other   Low vitamin D level    Now taking 2000 iu daily  Last level 22.1 Level today Disc imp to bone and overall health      Relevant Orders   VITAMIN D 25 Hydroxy (Vit-D Deficiency, Fractures)   Right ear pain    Nl exam  Suspect ETD Suggest flonase daily through allergy season  Update if not starting to improve in a week or if worsening

## 2021-04-12 NOTE — Assessment & Plan Note (Signed)
Under care of endocrinology  Last a1c over 8 Cutting back on sweets now  On arb and statin

## 2021-04-12 NOTE — Assessment & Plan Note (Signed)
Disc goals for lipids and reasons to control them Rev last labs with pt Rev low sat fat diet in detail  LDL well controlled at 37 Intolerant of atorvastatin / fatigue and myalgia inst to hold for 2 wk and update  If it is a true side eff will try low dose crestor or intermittent Also trial of co Q 10 Discussed diet  Disc imp of statin in diabetes

## 2021-04-12 NOTE — Assessment & Plan Note (Signed)
Nl exam  Suspect ETD Suggest flonase daily through allergy season  Update if not starting to improve in a week or if worsening

## 2021-04-12 NOTE — Assessment & Plan Note (Signed)
Hypothyroidism  Pt has no clinical changes No change in energy level/ hair or skin/ edema and no tremor Lab Results  Component Value Date   TSH 2.47 09/02/2020    Continues levothyroxine 75 mcg daily

## 2021-04-12 NOTE — Assessment & Plan Note (Signed)
Now taking 2000 iu daily  Last level 22.1 Level today Disc imp to bone and overall health

## 2021-04-12 NOTE — Assessment & Plan Note (Signed)
bp is stable today  No cp or palpitations or headaches or edema  No side effects to medicines  BP Readings from Last 3 Encounters:  04/12/21 138/86  02/22/21 128/80  10/17/20 120/72     Plan to continue benicar 40 mg daily

## 2021-04-12 NOTE — Patient Instructions (Addendum)
Hold the lipitor for 2 weeks and update [place] re: how you feel  If doing better-we can try a small dose of crestor and see how you tolerate it   Co enzyme Q 10 may also help (over the counter)   Let your dentist know if any pain persists   Use flonase daily to help keep ears open during allergy season  If ear pain does not improve keep [place] posted   If you are interested in the shingles vaccine series (Shingrix), call your insurance or pharmacy to check on coverage and location it must be given.  If affordable - you can schedule it here or at your pharmacy depending on coverage  I recommend the vaccine   Lets check vitamin D today   Take care of yourself  Try to eat a low glycemic diet

## 2021-04-12 NOTE — Assessment & Plan Note (Signed)
For congestion enc to stay on flonase daily through the allergy season

## 2021-04-13 LAB — VITAMIN D 25 HYDROXY (VIT D DEFICIENCY, FRACTURES): VITD: 34.05 ng/mL (ref 30.00–100.00)

## 2021-04-21 ENCOUNTER — Encounter: Payer: Self-pay | Admitting: Family Medicine

## 2021-04-21 DIAGNOSIS — E785 Hyperlipidemia, unspecified: Secondary | ICD-10-CM

## 2021-04-21 DIAGNOSIS — E1169 Type 2 diabetes mellitus with other specified complication: Secondary | ICD-10-CM

## 2021-04-22 ENCOUNTER — Other Ambulatory Visit: Payer: Self-pay | Admitting: Internal Medicine

## 2021-04-24 MED ORDER — ROSUVASTATIN CALCIUM 10 MG PO TABS: 10.0000 mg | ORAL_TABLET | Freq: Every day | ORAL | 11 refills | Status: DC

## 2021-05-02 ENCOUNTER — Other Ambulatory Visit: Payer: Self-pay | Admitting: Internal Medicine

## 2021-05-15 ENCOUNTER — Other Ambulatory Visit: Payer: Self-pay | Admitting: Internal Medicine

## 2021-05-21 ENCOUNTER — Encounter: Payer: Self-pay | Admitting: Gastroenterology

## 2021-05-24 ENCOUNTER — Other Ambulatory Visit (INDEPENDENT_AMBULATORY_CARE_PROVIDER_SITE_OTHER): Payer: 59

## 2021-05-24 ENCOUNTER — Other Ambulatory Visit: Payer: Self-pay

## 2021-05-24 DIAGNOSIS — Z23 Encounter for immunization: Secondary | ICD-10-CM

## 2021-05-24 DIAGNOSIS — E785 Hyperlipidemia, unspecified: Secondary | ICD-10-CM

## 2021-05-24 DIAGNOSIS — E1169 Type 2 diabetes mellitus with other specified complication: Secondary | ICD-10-CM

## 2021-05-24 LAB — AST: AST: 28 U/L (ref 0–37)

## 2021-05-24 LAB — LIPID PANEL
Cholesterol: 140 mg/dL (ref 0–200)
HDL: 43.9 mg/dL (ref >39.00)
NonHDL: 95.92
Total CHOL/HDL Ratio: 3
Triglycerides: 225 mg/dL — ABNORMAL HIGH (ref 0.0–149.0)
VLDL: 45 mg/dL — ABNORMAL HIGH (ref 0.0–40.0)

## 2021-05-24 LAB — LDL CHOLESTEROL, DIRECT: Direct LDL: 67 mg/dL

## 2021-05-24 LAB — ALT: ALT: 37 U/L (ref 0–53)

## 2021-05-25 ENCOUNTER — Other Ambulatory Visit: Payer: 59

## 2021-05-31 ENCOUNTER — Ambulatory Visit: Payer: 59 | Admitting: Family Medicine

## 2021-05-31 ENCOUNTER — Other Ambulatory Visit: Payer: Self-pay

## 2021-05-31 ENCOUNTER — Ambulatory Visit (INDEPENDENT_AMBULATORY_CARE_PROVIDER_SITE_OTHER): Payer: 59

## 2021-05-31 ENCOUNTER — Encounter: Payer: Self-pay | Admitting: Family Medicine

## 2021-05-31 DIAGNOSIS — R2231 Localized swelling, mass and lump, right upper limb: Secondary | ICD-10-CM

## 2021-05-31 DIAGNOSIS — R2230 Localized swelling, mass and lump, unspecified upper limb: Secondary | ICD-10-CM | POA: Insufficient documentation

## 2021-05-31 NOTE — Patient Instructions (Addendum)
Xray today   The lump may be a hematoma (deep bruise/injury)  Watch for redness or heat or pain -which could be signs of an infection or abscess   Keep [place] posted  We will contact you with the xray result   Use ice/cold compress as needed  Elevate it when you get a chance

## 2021-05-31 NOTE — Progress Notes (Signed)
Subjective:    Patient ID: Mitchell Palmer, male    DOB: 02/02/1966, 55 y.o.   MRN: [id number]  This visit occurred during the SARS-CoV-2 public health emergency.  Safety protocols were in place, including screening questions prior to the visit, additional usage of staff PPE, and extensive cleaning of exam room while observing appropriate contact time as indicated for disinfecting solutions.   HPI Pt presents for bump on arm  Wt Readings from Last 3 Encounters:  05/31/21 231 lb (104.8 kg)  04/12/21 226 lb 3 oz (102.6 kg)  02/22/21 225 lb (102.1 kg)   35.12 kg/m  Right forearm bump  About 2 weeks or so   Thinks it started when he hit it on a door at work/really hurt  Had ice on it early on   Not really painful  Not red   Can move it a little   No aspirin and blood thinners   DM2 Under care of endocrinology  Now taking crestor for cholesterol Better tolerated  Lab Results  Component Value Date   CHOL 140 05/24/2021   HDL 43.90 05/24/2021   LDLCALC 37 10/20/2019   LDLDIRECT 67.0 05/24/2021   TRIG 225.0 (H) 05/24/2021   CHOLHDL 3 05/24/2021   Patient Active Problem List   Diagnosis Date Noted   Localized swelling, mass and lump, upper limb 05/31/2021   Right ear pain 04/12/2021   Low vitamin D level 09/14/2020   Prostate cancer screening 09/02/2020   Current use of proton pump inhibitor 09/02/2020   Family history of factor V Leiden mutation 05/30/2020   Left ankle pain 01/29/2020   Stable treated proliferative diabetic retinopathy of right eye determined by examination associated with type 2 diabetes mellitus ([place]) 12/24/2019   Stable treated proliferative diabetic retinopathy of left eye determined by examination associated with type 2 diabetes mellitus ([place]) 12/24/2019   Vitreous hemorrhage of right eye ([place]) 12/24/2019   Nuclear sclerotic cataract of left eye 12/24/2019   Low testosterone 11/13/2019   Boil of groin 11/10/2019   Fatigue 10/20/2019    Exposure to COVID-19 virus 12/03/2018   ETD (eustachian tube dysfunction) 10/16/2018   Neck pain 04/28/2018   Need for hepatitis B screening test 03/26/2018   Need for hepatitis C screening test 03/26/2018   Paresthesia of both feet 11/01/2017   Hemorrhoids 10/14/2017   BMI 34.0-34.9,adult 01/24/2016   Hypothyroidism 01/24/2016   Uncontrolled type 2 diabetes mellitus with stage 3 chronic kidney disease, with long-term current use of insulin 07/28/2015   Alcohol consumption heavy 12/06/2014   Necrobiosis diabeticorum ([place]) 03/01/2014   Routine general medical examination at a health care facility 09/30/2013   Low back pain 06/17/2012   Abnormal chest x-ray 10/15/2011   PLANTAR FASCIITIS, TRAUMATIC 32/99/2426   NEOPLASM UNCERTAIN BEHAVIOR OTHER SPEC SITES 10/07/2009   ESOPHAGEAL STRICTURE 06/27/2009   GERD 03/31/2009   Hyperlipidemia associated with type 2 diabetes mellitus ([place]) 01/06/2009   Essential hypertension 01/06/2009   Allergic rhinitis 01/06/2009   Past Medical History:  Diagnosis Date   Allergy    allegic rhinitis   Diabetes mellitus    type II   Esophageal stricture    GERD (gastroesophageal reflux disease)    History of hernia repair    as a baby   Hyperlipidemia    Hyperplastic polyp of intestine 2010   Hypertension    Proliferative diabetic retinopathy of left eye ([place]) 03/31/2020   This is active as a patient still has tufts of the anterior segment  neovascularization, rubeosis in the left eye every region of capillary nonperfusion should be treated with PRP.   S/P ear surgery, follow-up exam    for drainage   Past Surgical History:  Procedure Laterality Date   COLONOSCOPY     ESOPHAGOGASTRODUODENOSCOPY  04/2009   stricture/GERD   HERNIA REPAIR     age [place]   went in behind rt ear-blockage   POLYPECTOMY     SHOULDER ARTHROSCOPY WITH ROTATOR CUFF REPAIR AND SUBACROMIAL DECOMPRESSION Right 09/29/2012   Procedure: SHOULDER  ARTHROSCOPY WITH ROTATOR CUFF REPAIR AND SUBACROMIAL DECOMPRESSION;  Surgeon: Nita Sells, MD;  Location: [facility];  Service: Orthopedics;  Laterality: Right;  Right shoulder arthroscopy with subacromial decompression and distal clavicle excision & Debridement of Labrial Tear   UPPER GASTROINTESTINAL ENDOSCOPY     Social History   Tobacco Use   Smoking status: Never   Smokeless tobacco: Never  Substance Use Topics   Alcohol use: Yes    Alcohol/week: 12.0 standard drinks    Types: 12 Standard drinks or equivalent per week    Comment: 6 pk every 3 days; 12 pk/week   Drug use: No   Family History  Problem Relation Age of Onset   Diabetes Mother    Colon cancer Father 28   Diabetes Other    Colon polyps Brother    Diabetes Maternal Aunt    Cancer Cousin        breast cancer   Allergies  Allergen Reactions   Atorvastatin     Muscle pain    Codeine     REACTION: anxious   Current Outpatient Medications on File Prior to Visit  Medication Sig Dispense Refill   B-D UF III MINI PEN NEEDLES 31G X 5 MM MISC USE AS DIRECTED 4 TIMES A DAY 400 each 3   cyclobenzaprine (FLEXERIL) 10 MG tablet Take 1 tablet (10 mg total) by mouth 3 (three) times daily as needed. 30 tablet 3   empagliflozin (JARDIANCE) 25 MG TABS tablet Take 1 tablet (25 mg total) by mouth daily before breakfast. 90 tablet 3   fluticasone (FLONASE) 50 MCG/ACT nasal spray SPRAY 2 SPRAYS INTO EACH NOSTRIL EVERY DAY 48 mL 3   insulin lispro (HUMALOG KWIKPEN) 100 UNIT/ML KwikPen INJECT 15-25 UNITS INTO THE SKIN 3 TIMES DAILY W/ MEALS AND 10-12 UNITS INTO THE SKIN W/ SNACKS 30 mL 1   LANTUS SOLOSTAR 100 UNIT/ML Solostar Pen INJECT 42 UNITS IN THE MORNING AND 50 UNITS IN THE EVENING 30 mL 2   levothyroxine (SYNTHROID) 75 MCG tablet TAKE 1 TABLET BY MOUTH ONCE A DAY ON AN EMPTY STOMACH 30 MINUTES BEFORE EATING. 30 tablet 11   metFORMIN (GLUCOPHAGE) 1000 MG tablet TAKE 1 TABLET (1,000 MG TOTAL) BY MOUTH 2  (TWO) TIMES DAILY WITH A MEAL. 180 tablet 3   mometasone (ELOCON) 0.1 % cream Apply 1 application topically daily. To affected areas 30 g 1   omeprazole (PRILOSEC) 40 MG capsule Take 1 capsule (40 mg total) by mouth daily. 30 capsule 11   ONETOUCH ULTRA test strip USE TO TEST BLOOD SUGAR 4 TIMES DAILY AS DIRECTED 400 strip 11   rosuvastatin (CRESTOR) 10 MG tablet Take 1 tablet (10 mg total) by mouth daily. 30 tablet 11   tadalafil (CIALIS) 20 MG tablet Take 1 tablet (20 mg total) by mouth as directed. 4 tablet 5   TRULICITY 4.5 IR/5.1OA SOPN INJECT 4.5 MG AS DIRECTED ONCE A  WEEK. 2 mL 11   vitamin B-12 (CYANOCOBALAMIN) 1000 MCG tablet Take 1,000 mcg by mouth daily.     No current facility-administered medications on file prior to visit.     Review of Systems  Constitutional:  Negative for activity change, appetite change, fatigue, fever and unexpected weight change.  HENT:  Negative for sore throat and trouble swallowing.   Eyes:  Negative for pain, redness, itching and visual disturbance.  Respiratory:  Negative for cough, chest tightness, shortness of breath and wheezing.   Cardiovascular:  Negative for chest pain and palpitations.  Gastrointestinal:  Negative for abdominal pain, blood in stool, constipation, diarrhea and nausea.  Endocrine: Negative for cold intolerance, heat intolerance, polydipsia and polyuria.  Genitourinary:  Negative for difficulty urinating, dysuria, frequency and urgency.  Musculoskeletal:  Negative for arthralgias, joint swelling and myalgias.       Recent injury of R shoulder  Waiting on MRI from workman's comp/job  Skin:  Negative for pallor and rash.  Neurological:  Negative for dizziness, tremors, weakness, numbness and headaches.  Hematological:  Negative for adenopathy. Does not bruise/bleed easily.  Psychiatric/Behavioral:  Negative for decreased concentration and dysphoric mood. The patient is not nervous/anxious.       Objective:   Physical  Exam Constitutional:      General: He is not in acute distress.    Appearance: Normal appearance. He is obese. He is not ill-appearing.  Cardiovascular:     Pulses: Normal pulses.     Comments: Normal pulses and perfusion of LUE Musculoskeletal:     Cervical back: Neck supple. No tenderness.     Comments: 3-4 cm oval lump on L medial forearm  It is tense but not hard, semi mobile and non tender  No erythema or skin change or drainage  No warmth  Lymphadenopathy:     Cervical: No cervical adenopathy.  Skin:    General: Skin is warm and dry.     Coloration: Skin is not pale.     Findings: No bruising, erythema or rash.  Neurological:     Mental Status: He is alert.     Cranial Nerves: No cranial nerve deficit.     Sensory: No sensory deficit.     Motor: No weakness.  Psychiatric:        Mood and Affect: Mood normal.          Assessment & Plan:   Problem List Items Addressed This Visit       Other   Localized swelling, mass and lump, upper limb    Lump is tense but not hard and slt mobile , without tenderness or warmth  No signs of compartment syndrome and nl perfusion of arm and hand noted Suspect a hematoma from blunt injury 2 wk ago  Want to r/o a fracture so xray ordered  Will watch closely for s/s of abscess or infection (pt is diabetic and has a h/o staph infection)  Recommend elevation and ice when able  Protect from trauma  inst to call if he develops pain or tenderness or erythema/skin change  ER precautions discussed      Relevant Orders   DG Forearm Right

## 2021-05-31 NOTE — Assessment & Plan Note (Addendum)
Lump is tense but not hard and slt mobile , without tenderness or warmth  No signs of compartment syndrome and nl perfusion of arm and hand noted Suspect a hematoma from blunt injury 2 wk ago  Want to r/o a fracture so xray ordered  Will watch closely for s/s of abscess or infection (pt is diabetic and has a h/o staph infection)  Recommend elevation and ice when able  Protect from trauma  inst to call if he develops pain or tenderness or erythema/skin change  ER precautions discussed

## 2021-06-05 ENCOUNTER — Telehealth: Payer: Self-pay | Admitting: *Deleted

## 2021-06-05 ENCOUNTER — Other Ambulatory Visit: Payer: Self-pay | Admitting: Internal Medicine

## 2021-06-05 DIAGNOSIS — R2231 Localized swelling, mass and lump, right upper limb: Secondary | ICD-10-CM

## 2021-06-05 NOTE — Telephone Encounter (Signed)
-----   Message from Abner Greenspan, MD sent at 06/04/2021  2:09 PM EDT ----- Forearm xray is normal  I still suspect his lump is a hematoma (most likely) If no improvement since his visit (size or lump) or if bigger, I would like to order an ultrasound of the area to evaluate further  Let me know if agreeable and what location

## 2021-06-05 NOTE — Telephone Encounter (Signed)
Left VM requesting pt to call the office back regarding his xray results

## 2021-06-06 ENCOUNTER — Encounter (INDEPENDENT_AMBULATORY_CARE_PROVIDER_SITE_OTHER): Payer: 59 | Admitting: Ophthalmology

## 2021-06-07 ENCOUNTER — Ambulatory Visit (INDEPENDENT_AMBULATORY_CARE_PROVIDER_SITE_OTHER): Payer: 59 | Admitting: Ophthalmology

## 2021-06-07 ENCOUNTER — Other Ambulatory Visit: Payer: Self-pay

## 2021-06-07 ENCOUNTER — Encounter (INDEPENDENT_AMBULATORY_CARE_PROVIDER_SITE_OTHER): Payer: Self-pay | Admitting: Ophthalmology

## 2021-06-07 DIAGNOSIS — E113552 Type 2 diabetes mellitus with stable proliferative diabetic retinopathy, left eye: Secondary | ICD-10-CM | POA: Diagnosis not present

## 2021-06-07 DIAGNOSIS — H2512 Age-related nuclear cataract, left eye: Secondary | ICD-10-CM

## 2021-06-07 DIAGNOSIS — E113592 Type 2 diabetes mellitus with proliferative diabetic retinopathy without macular edema, left eye: Secondary | ICD-10-CM

## 2021-06-07 DIAGNOSIS — H4312 Vitreous hemorrhage, left eye: Secondary | ICD-10-CM | POA: Diagnosis not present

## 2021-06-07 DIAGNOSIS — E113512 Type 2 diabetes mellitus with proliferative diabetic retinopathy with macular edema, left eye: Secondary | ICD-10-CM | POA: Insufficient documentation

## 2021-06-07 NOTE — Telephone Encounter (Signed)
Pt called back he would like to have an Ultrasound done on his forearm

## 2021-06-07 NOTE — Telephone Encounter (Signed)
Order is in for right forearm soft tissue (not vascular) Let me know if I need to order it any differently, thanks

## 2021-06-07 NOTE — Assessment & Plan Note (Signed)
Moderate nuclear sclerotic cataract present in the left eye however this is enough to hamper potential need for resection of proliferative diabetic retinopathy progressive despite previous panretinal photocoagulation and now new onset early vitreous hemorrhage left eye.  From medical standpoint I have encouraged patient to return to see Dr. Duanne Guess for consideration of cataract surgery intraocular displacement left eye from a medical standpoint in preparation for possible vitrectomy left eye but also to balance the refractive nature of each eye into a bilateral pseudophakic state

## 2021-06-07 NOTE — Assessment & Plan Note (Signed)
Local activation of PDR disease along the inferotemporal arcade near the macula with a small amount of preretinal hemorrhage. Peripheral PRP is now distal to this area but none is posterior to it because of the proximity to the fovea.  This area may in fact need local treatment but probable resection will be required if vitreous hemorrhage continues.  I will asked patient have cataract surgery done first thereafter we may deliver intravitreal antivegF prior to proceeding to vitrectomy and resection of this area should the vitreous hemorrhage proceed and worsen

## 2021-06-07 NOTE — Assessment & Plan Note (Signed)
OS now reactivated PDR locally

## 2021-06-07 NOTE — Progress Notes (Signed)
06/07/2021     CHIEF COMPLAINT Patient presents for  Chief Complaint  Patient presents with   Retina Evaluation      HISTORY OF PRESENT ILLNESS: Mitchell Palmer is a 55 y.o. male who presents to the clinic today for:   HPI     Retina Evaluation   In left eye.  This started 1 month ago.  Duration of 1 month.        Comments   New onset squiggly line left eye      Last edited by Hurman Horn, MD on 06/07/2021  3:06 PM.      Referring physician: Warden Fillers, MD [address] [place],  [place] [address]  HISTORICAL INFORMATION:   Selected notes from the MEDICAL RECORD NUMBER    Lab Results  Component Value Date   HGBA1C 8.1 (A) 02/22/2021     CURRENT MEDICATIONS: No current outpatient medications on file. (Ophthalmic Drugs)   No current facility-administered medications for this visit. (Ophthalmic Drugs)   Current Outpatient Medications (Other)  Medication Sig   insulin lispro (HUMALOG KWIKPEN) 100 UNIT/ML KwikPen INJECT 15-25 UNITS INTO THE SKIN 3 TIMES DAILY W/ MEALS AND 10-12 UNITS INTO THE SKIN W/ SNACKS   metFORMIN (GLUCOPHAGE) 1000 MG tablet TAKE 1 TABLET (1,000 MG TOTAL) BY MOUTH 2 (TWO) TIMES DAILY WITH A MEAL.   B-D UF III MINI PEN NEEDLES 31G X 5 MM MISC USE AS DIRECTED 4 TIMES A DAY   cyclobenzaprine (FLEXERIL) 10 MG tablet Take 1 tablet (10 mg total) by mouth 3 (three) times daily as needed.   empagliflozin (JARDIANCE) 25 MG TABS tablet Take 1 tablet (25 mg total) by mouth daily before breakfast.   fluticasone (FLONASE) 50 MCG/ACT nasal spray SPRAY 2 SPRAYS INTO EACH NOSTRIL EVERY DAY   LANTUS SOLOSTAR 100 UNIT/ML Solostar Pen INJECT 42 UNITS IN THE MORNING AND 50 UNITS IN THE EVENING   levothyroxine (SYNTHROID) 75 MCG tablet TAKE 1 TABLET BY MOUTH ONCE A DAY ON AN EMPTY STOMACH 30 MINUTES BEFORE EATING.   mometasone (ELOCON) 0.1 % cream Apply 1 application topically daily. To affected areas   omeprazole (PRILOSEC) 40 MG capsule  Take 1 capsule (40 mg total) by mouth daily.   ONETOUCH ULTRA test strip USE TO TEST BLOOD SUGAR 4 TIMES DAILY AS DIRECTED   rosuvastatin (CRESTOR) 10 MG tablet Take 1 tablet (10 mg total) by mouth daily.   tadalafil (CIALIS) 20 MG tablet Take 1 tablet (20 mg total) by mouth as directed.   TRULICITY 4.5 OQ/9.4TM SOPN INJECT 4.5 MG AS DIRECTED ONCE A WEEK.   vitamin B-12 (CYANOCOBALAMIN) 1000 MCG tablet Take 1,000 mcg by mouth daily.   No current facility-administered medications for this visit. (Other)      REVIEW OF SYSTEMS: ROS   Negative for: Constitutional, Gastrointestinal, Neurological, Skin, Genitourinary, Musculoskeletal, HENT, Endocrine, Cardiovascular, Eyes, Respiratory, Psychiatric, Allergic/Imm, Heme/Lymph Last edited by Hurman Horn, MD on 06/07/2021  3:46 PM.       ALLERGIES Allergies  Allergen Reactions   Atorvastatin     Muscle pain    Codeine     REACTION: anxious    PAST MEDICAL HISTORY Past Medical History:  Diagnosis Date   Allergy    allegic rhinitis   Diabetes mellitus    type II   Esophageal stricture    GERD (gastroesophageal reflux disease)    History of hernia repair    as a baby   Hyperlipidemia  Hyperplastic polyp of intestine 2010   Hypertension    Proliferative diabetic retinopathy of left eye ([place]) 03/31/2020   This is active as a patient still has tufts of the anterior segment neovascularization, rubeosis in the left eye every region of capillary nonperfusion should be treated with PRP.   S/P ear surgery, follow-up exam    for drainage   Vitreous hemorrhage of right eye ([place]) 12/24/2019   Past Surgical History:  Procedure Laterality Date   COLONOSCOPY     ESOPHAGOGASTRODUODENOSCOPY  04/2009   stricture/GERD   HERNIA REPAIR     age [place]   went in behind rt ear-blockage   POLYPECTOMY     SHOULDER ARTHROSCOPY WITH ROTATOR CUFF REPAIR AND SUBACROMIAL DECOMPRESSION Right 09/29/2012   Procedure:  SHOULDER ARTHROSCOPY WITH ROTATOR CUFF REPAIR AND SUBACROMIAL DECOMPRESSION;  Surgeon: Nita Sells, MD;  Location: [facility];  Service: Orthopedics;  Laterality: Right;  Right shoulder arthroscopy with subacromial decompression and distal clavicle excision & Debridement of Labrial Tear   UPPER GASTROINTESTINAL ENDOSCOPY      FAMILY HISTORY Family History  Problem Relation Age of Onset   Diabetes Mother    Colon cancer Father 74   Diabetes Other    Colon polyps Brother    Diabetes Maternal Aunt    Cancer Cousin        breast cancer    SOCIAL HISTORY Social History   Tobacco Use   Smoking status: Never   Smokeless tobacco: Never  Substance Use Topics   Alcohol use: Yes    Alcohol/week: 12.0 standard drinks    Types: 12 Standard drinks or equivalent per week    Comment: 6 pk every 3 days; 12 pk/week   Drug use: No         OPHTHALMIC EXAM:  Base Eye Exam     Visual Acuity (ETDRS)       Right Left   Dist cc 20/20 +1 20/15 -2         Tonometry (Tonopen, 3:06 PM)       Right Left   Pressure 17 13         Pupils       Pupils React APD   Right PERRL Brisk None   Left PERRL Brisk None         Visual Fields       Left Right    Full Full         Extraocular Movement       Right Left    Full, Ortho Full, Ortho         Neuro/Psych     Oriented x3: Yes   Mood/Affect: Normal         Dilation     Both eyes: 1.0% Mydriacyl, 2.5% Phenylephrine @ 3:07 PM           Slit Lamp and Fundus Exam     External Exam       Right Left   External Normal Normal         Slit Lamp Exam       Right Left   Lids/Lashes Normal Normal   Conjunctiva/Sclera White and quiet White and quiet   Cornea Clear Clear   Anterior Chamber Deep and quiet Deep and quiet   Iris Round and reactive Round and reactive,, small remnants of iris neovascularization 3,5,9 not active   Lens Posterior chamber intraocular lens,, , 1+ Posterior  capsular  opacification 2+ Nuclear sclerosis   Anterior Vitreous Normal Normal         Fundus Exam       Right Left   Posterior Vitreous Vitrectomized,    Disc Normal Normal   C/D Ratio 0.15 0.2   Macula no macular thickening, no exudates, Microaneurysms, Focal laser scars, no clinically significant macular edema Microaneurysms, no macular thickening, no clinically significant macular edema   Vessels Proliferative diabetic retinopathy quiesced since Proliferative diabetic retinopathy, with good PRP yet there are a temporal window anterior to the equator in the left eye in addition to the region temporal to the macula in the left eye with large regions of capillary nonperfusion which might be triggering the anterior segment neovascularization   Periphery Good PRP and attached Good PRP and attached, some room anterior in the temporal retina for more PRP if disease reactivates            IMAGING AND PROCEDURES  Imaging and Procedures for 06/07/21  Color Fundus Photography Optos - OU - Both Eyes       Right Eye Progression has been stable. Disc findings include normal observations. Macula : microaneurysms.   Left Eye Progression has been stable. Macula : microaneurysms.   Notes Good prp 360, attached, clear media.  DR quiet OD  OS, with small vitreous hemorrhage from new neovascularization along the inferotemporal arcade preretinal with good PRP peripheral to this and not much room remaining the posterior pole for further laser surrounding this area               ASSESSMENT/PLAN:  Nuclear sclerotic cataract of left eye Moderate nuclear sclerotic cataract present in the left eye however this is enough to hamper potential need for resection of proliferative diabetic retinopathy progressive despite previous panretinal photocoagulation and now new onset early vitreous hemorrhage left eye.  From medical standpoint I have encouraged patient to return to see Dr. Duanne Guess for consideration of cataract surgery intraocular displacement left eye from a medical standpoint in preparation for possible vitrectomy left eye but also to balance the refractive nature of each eye into a bilateral pseudophakic state  Stable treated proliferative diabetic retinopathy of left eye determined by examination associated with type 2 diabetes mellitus ([place]) OS now reactivated PDR locally  Proliferative diabetic retinopathy of left eye associated with type 2 diabetes mellitus ([place]) Local activation of PDR disease along the inferotemporal arcade near the macula with a small amount of preretinal hemorrhage. Peripheral PRP is now distal to this area but none is posterior to it because of the proximity to the fovea.  This area may in fact need local treatment but probable resection will be required if vitreous hemorrhage continues.  I will asked patient have cataract surgery done first thereafter we may deliver intravitreal antivegF prior to proceeding to vitrectomy and resection of this area should the vitreous hemorrhage proceed and worsen     ICD-10-CM   1. Vitreous hemorrhage, left eye (HCC)  H43.12 Color Fundus Photography Optos - OU - Both Eyes    2. Proliferative diabetic retinopathy of left eye associated with type 2 diabetes mellitus, macular edema presence unspecified (HCC)  O12.2482 Color Fundus Photography Optos - OU - Both Eyes    3. Nuclear sclerotic cataract of left eye  H25.12     4. Stable treated proliferative diabetic retinopathy of left eye determined by examination associated with type 2 diabetes mellitus ([place])  N00.3704       OS, with new  onset vitreous hemorrhage, with only local activation of disease however this is in the posterior pole near the macula.  I recommended patient have cataract surgery performance with intraocular lens placement left eye on the basis of medical need possible surgical vitrectomy intervention in the left eye to clear the vitreous  scaffold to prevent progressive PDR in the face of excellent PRP.  2.  Follow-up with Dr. Duanne Guess for consideration of cath extraction intraocular displacement left eye  3.  Ophthalmic Meds Ordered this visit:  No orders of the defined types were placed in this encounter.      Return in about 8 weeks (around 08/02/2021) for dilate, OS, COLOR FP, OCT.  There are no Patient Instructions on file for this visit.   Explained the diagnoses, plan, and follow up with the patient and they expressed understanding.  Patient expressed understanding of the importance of proper follow up care.   Clent Demark Jhamir Pickup M.D. Diseases & Surgery of the Retina and Vitreous Retina & Diabetic [place] 06/07/21     Abbreviations: M myopia (nearsighted); A astigmatism; H hyperopia (farsighted); P presbyopia; Mrx spectacle prescription;  CTL contact lenses; OD right eye; OS left eye; OU both eyes  XT exotropia; ET esotropia; PEK punctate epithelial keratitis; PEE punctate epithelial erosions; DES dry eye syndrome; MGD meibomian gland dysfunction; ATs artificial tears; PFAT's preservative free artificial tears; [place] nuclear sclerotic cataract; PSC posterior subcapsular cataract; ERM epi-retinal membrane; PVD posterior vitreous detachment; RD retinal detachment; DM diabetes mellitus; DR diabetic retinopathy; NPDR non-proliferative diabetic retinopathy; PDR proliferative diabetic retinopathy; CSME clinically significant macular edema; DME diabetic macular edema; dbh dot blot hemorrhages; CWS cotton wool spot; POAG primary open angle glaucoma; C/D cup-to-disc ratio; HVF humphrey visual field; GVF goldmann visual field; OCT optical coherence tomography; IOP intraocular pressure; BRVO Branch retinal vein occlusion; CRVO central retinal vein occlusion; CRAO central retinal artery occlusion; BRAO branch retinal artery occlusion; RT retinal tear; SB scleral buckle; PPV pars plana vitrectomy; VH Vitreous hemorrhage;  PRP panretinal laser photocoagulation; IVK intravitreal kenalog; VMT vitreomacular traction; MH Macular hole;  NVD neovascularization of the disc; NVE neovascularization elsewhere; AREDS age related eye disease study; ARMD age related macular degeneration; POAG primary open angle glaucoma; EBMD epithelial/anterior basement membrane dystrophy; ACIOL anterior chamber intraocular lens; IOL intraocular lens; PCIOL posterior chamber intraocular lens; Phaco/IOL phacoemulsification with intraocular lens placement; [place] photorefractive keratectomy; LASIK laser assisted in situ keratomileusis; HTN hypertension; DM diabetes mellitus; COPD chronic obstructive pulmonary disease

## 2021-06-16 NOTE — Addendum Note (Signed)
Addended by: Kris Mouton on: 06/16/2021 10:25 AM   Modules accepted: Orders

## 2021-06-16 NOTE — Telephone Encounter (Signed)
The preferred location was entered as internal not for [facility] that is why the pt has been contacted, I have called the office and got pt scheduled and he is aware.

## 2021-06-16 NOTE — Telephone Encounter (Signed)
This order was sent to [facility] and the patient can call to schedule. They normally call the patient to schedule once the order is placed on the WQ but I am not sure why this patient has not been called yet.

## 2021-06-16 NOTE — Telephone Encounter (Signed)
Pt called stating that he has been waiting for a week to have ultra sound on right arm. Please advise.

## 2021-06-16 NOTE — Telephone Encounter (Deleted)
Called and spoke with Mitchell Palmer and they refused to schedule the appt given the location is selected as "Internal". Apparently they are cracking down on the location selections in these orders.   The order needs to be corrected and location changed to [facility].   When choosing Internal it throws the order into 'limbo' and they are not scheduled bc they cannot be seen. They also fall off my WQ bc of the type of order they are so unless I go looking for them I do not see this type of order either.   Once the order is corrected they stated that they can schedule the patient.

## 2021-06-16 NOTE — Telephone Encounter (Signed)
Sending note on as urgent to [place]. Order was placed as internal, when this happens the orders disappear basically and do not show up as needed to be scheduled.

## 2021-06-23 ENCOUNTER — Other Ambulatory Visit: Payer: Self-pay | Admitting: Orthopedic Surgery

## 2021-06-23 DIAGNOSIS — M25512 Pain in left shoulder: Secondary | ICD-10-CM

## 2021-06-25 ENCOUNTER — Other Ambulatory Visit: Payer: Self-pay

## 2021-06-25 ENCOUNTER — Ambulatory Visit
Admission: RE | Admit: 2021-06-25 | Discharge: 2021-06-25 | Disposition: A | Discharge status: Home / Self Care | Payer: 59 | Source: Ambulatory Visit | Attending: Orthopedic Surgery | Admitting: Orthopedic Surgery

## 2021-06-25 DIAGNOSIS — M25512 Pain in left shoulder: Secondary | ICD-10-CM

## 2021-06-27 ENCOUNTER — Ambulatory Visit
Admission: RE | Admit: 2021-06-27 | Discharge: 2021-06-27 | Disposition: A | Discharge status: Home / Self Care | Payer: 59 | Source: Ambulatory Visit | Attending: Family Medicine | Admitting: Family Medicine

## 2021-06-27 DIAGNOSIS — R2231 Localized swelling, mass and lump, right upper limb: Secondary | ICD-10-CM

## 2021-06-28 ENCOUNTER — Encounter: Payer: Self-pay | Admitting: Family Medicine

## 2021-07-05 ENCOUNTER — Encounter (INDEPENDENT_AMBULATORY_CARE_PROVIDER_SITE_OTHER): Payer: Self-pay

## 2021-07-05 ENCOUNTER — Ambulatory Visit: Payer: 59 | Admitting: Internal Medicine

## 2021-07-10 ENCOUNTER — Ambulatory Visit (INDEPENDENT_AMBULATORY_CARE_PROVIDER_SITE_OTHER): Payer: 59 | Admitting: Ophthalmology

## 2021-07-10 ENCOUNTER — Encounter (INDEPENDENT_AMBULATORY_CARE_PROVIDER_SITE_OTHER): Payer: 59 | Admitting: Ophthalmology

## 2021-07-10 ENCOUNTER — Other Ambulatory Visit: Payer: Self-pay

## 2021-07-10 ENCOUNTER — Encounter (INDEPENDENT_AMBULATORY_CARE_PROVIDER_SITE_OTHER): Payer: Self-pay | Admitting: Ophthalmology

## 2021-07-10 DIAGNOSIS — E113551 Type 2 diabetes mellitus with stable proliferative diabetic retinopathy, right eye: Secondary | ICD-10-CM | POA: Diagnosis not present

## 2021-07-10 DIAGNOSIS — E113592 Type 2 diabetes mellitus with proliferative diabetic retinopathy without macular edema, left eye: Secondary | ICD-10-CM | POA: Diagnosis not present

## 2021-07-10 DIAGNOSIS — H2512 Age-related nuclear cataract, left eye: Secondary | ICD-10-CM | POA: Diagnosis not present

## 2021-07-10 DIAGNOSIS — H4312 Vitreous hemorrhage, left eye: Secondary | ICD-10-CM

## 2021-07-10 DIAGNOSIS — H211X2 Other vascular disorders of iris and ciliary body, left eye: Secondary | ICD-10-CM | POA: Insufficient documentation

## 2021-07-10 MED ORDER — BEVACIZUMAB 2.5 MG/0.1ML IZ SOSY
2.5000 mg | PREFILLED_SYRINGE | INTRAVITREAL | Status: AC | PRN
Start: 2021-07-10 — End: 2021-07-10
  Administered 2021-07-10: 2.5 mg via INTRAVITREAL

## 2021-07-10 NOTE — Assessment & Plan Note (Signed)
OS, quiescent PDR, yet  Not vitrectomized eye.

## 2021-07-10 NOTE — Assessment & Plan Note (Signed)
Progression OS, proceed with cataract extraction as planned likely January, will need preoperatively however intravitreal Avastin control the iris neovascularization left eye

## 2021-07-10 NOTE — Assessment & Plan Note (Signed)
OD looks great with no active disease, quiet vitrectomized eye

## 2021-07-10 NOTE — Progress Notes (Signed)
07/10/2021     CHIEF COMPLAINT Patient presents for  Chief Complaint  Patient presents with   Retina Follow Up      HISTORY OF PRESENT ILLNESS: Oral Mitchell Palmer is a 55 y.o. male who presents to the clinic today for:   HPI     Retina Follow Up   Patient presents with  Diabetic Retinopathy.  This started 4.5.  Severity is mild.  Duration of 4.5.        Comments   4 weeks 5 days follow up OS oct fp. (Moved up from 12/15 per Dr Katy Fitch request. Per Dr. Katy Fitch note, "NVI OS. Would it be possible for him to get intravitreal anti- VEGF prior to [place]?"  Patient states vision is stable and unchanged since last visit. Denies any new floaters or FOL.       Last edited by Laurin Coder on 07/10/2021  1:31 PM.      Referring physician: Warden Fillers, MD [address] [place],  [place] [address]  HISTORICAL INFORMATION:   Selected notes from the MEDICAL RECORD NUMBER    Lab Results  Component Value Date   HGBA1C 8.1 (A) 02/22/2021     CURRENT MEDICATIONS: No current outpatient medications on file. (Ophthalmic Drugs)   No current facility-administered medications for this visit. (Ophthalmic Drugs)   Current Outpatient Medications (Other)  Medication Sig   insulin lispro (HUMALOG KWIKPEN) 100 UNIT/ML KwikPen INJECT 15-25 UNITS INTO THE SKIN 3 TIMES DAILY W/ MEALS AND 10-12 UNITS INTO THE SKIN W/ SNACKS   metFORMIN (GLUCOPHAGE) 1000 MG tablet TAKE 1 TABLET (1,000 MG TOTAL) BY MOUTH 2 (TWO) TIMES DAILY WITH A MEAL.   B-D UF III MINI PEN NEEDLES 31G X 5 MM MISC USE AS DIRECTED 4 TIMES A DAY   cyclobenzaprine (FLEXERIL) 10 MG tablet Take 1 tablet (10 mg total) by mouth 3 (three) times daily as needed.   empagliflozin (JARDIANCE) 25 MG TABS tablet Take 1 tablet (25 mg total) by mouth daily before breakfast.   fluticasone (FLONASE) 50 MCG/ACT nasal spray SPRAY 2 SPRAYS INTO EACH NOSTRIL EVERY DAY   LANTUS SOLOSTAR 100 UNIT/ML Solostar Pen INJECT 42 UNITS IN THE  MORNING AND 50 UNITS IN THE EVENING   levothyroxine (SYNTHROID) 75 MCG tablet TAKE 1 TABLET BY MOUTH ONCE A DAY ON AN EMPTY STOMACH 30 MINUTES BEFORE EATING.   mometasone (ELOCON) 0.1 % cream Apply 1 application topically daily. To affected areas   omeprazole (PRILOSEC) 40 MG capsule Take 1 capsule (40 mg total) by mouth daily.   ONETOUCH ULTRA test strip USE TO TEST BLOOD SUGAR 4 TIMES DAILY AS DIRECTED   rosuvastatin (CRESTOR) 10 MG tablet Take 1 tablet (10 mg total) by mouth daily.   tadalafil (CIALIS) 20 MG tablet Take 1 tablet (20 mg total) by mouth as directed.   TRULICITY 4.5 WU/9.8JX SOPN INJECT 4.5 MG AS DIRECTED ONCE A WEEK.   vitamin B-12 (CYANOCOBALAMIN) 1000 MCG tablet Take 1,000 mcg by mouth daily.   No current facility-administered medications for this visit. (Other)      REVIEW OF SYSTEMS:    ALLERGIES Allergies  Allergen Reactions   Atorvastatin     Muscle pain    Codeine     REACTION: anxious    PAST MEDICAL HISTORY Past Medical History:  Diagnosis Date   Allergy    allegic rhinitis   Diabetes mellitus    type II   Esophageal stricture    GERD (gastroesophageal  reflux disease)    History of hernia repair    as a baby   Hyperlipidemia    Hyperplastic polyp of intestine 2010   Hypertension    Proliferative diabetic retinopathy of left eye ([place]) 03/31/2020   This is active as a patient still has tufts of the anterior segment neovascularization, rubeosis in the left eye every region of capillary nonperfusion should be treated with PRP.   S/P ear surgery, follow-up exam    for drainage   Vitreous hemorrhage of right eye ([place]) 12/24/2019   Past Surgical History:  Procedure Laterality Date   COLONOSCOPY     ESOPHAGOGASTRODUODENOSCOPY  04/2009   stricture/GERD   HERNIA REPAIR     age [place]   went in behind rt ear-blockage   POLYPECTOMY     SHOULDER ARTHROSCOPY WITH ROTATOR CUFF REPAIR AND SUBACROMIAL DECOMPRESSION Right  09/29/2012   Procedure: SHOULDER ARTHROSCOPY WITH ROTATOR CUFF REPAIR AND SUBACROMIAL DECOMPRESSION;  Surgeon: Nita Sells, MD;  Location: [facility];  Service: Orthopedics;  Laterality: Right;  Right shoulder arthroscopy with subacromial decompression and distal clavicle excision & Debridement of Labrial Tear   UPPER GASTROINTESTINAL ENDOSCOPY      FAMILY HISTORY Family History  Problem Relation Age of Onset   Diabetes Mother    Colon cancer Father 38   Diabetes Other    Colon polyps Brother    Diabetes Maternal Aunt    Cancer Cousin        breast cancer    SOCIAL HISTORY Social History   Tobacco Use   Smoking status: Never   Smokeless tobacco: Never  Substance Use Topics   Alcohol use: Yes    Alcohol/week: 12.0 standard drinks    Types: 12 Standard drinks or equivalent per week    Comment: 6 pk every 3 days; 12 pk/week   Drug use: No         OPHTHALMIC EXAM:  Base Eye Exam     Visual Acuity (ETDRS)       Right Left   Dist cc 20/20 20/20         Tonometry (Tonopen, 1:32 PM)       Right Left   Pressure 18 19         Pupils       Pupils Dark Light APD   Right PERRL 4 3 None   Left PERRL 4 3 None         Extraocular Movement       Right Left    Full Full         Neuro/Psych     Oriented x3: Yes   Mood/Affect: Normal         Dilation     Left eye: 1.0% Mydriacyl, 2.5% Phenylephrine @ 1:32 PM           Slit Lamp and Fundus Exam     External Exam       Right Left   External Normal Normal         Slit Lamp Exam       Right Left   Lids/Lashes Normal Normal   Conjunctiva/Sclera White and quiet White and quiet   Cornea Clear Clear   Anterior Chamber Deep and quiet Deep and quiet   Iris Round and reactive Round and reactive,, small remnants of iris neovascularization 3,5,9 , may be early active   Lens Posterior chamber intraocular lens,, , 1+ Posterior capsular opacification  2+ Nuclear sclerosis    Anterior Vitreous Normal Normal         Fundus Exam       Right Left   Disc  Normal   C/D Ratio  0.2   Macula  Microaneurysms, no macular thickening, no clinically significant macular edema   Vessels  Proliferative diabetic retinopathy, with good PRP yet there are a temporal window anterior to the equator in the left eye in addition to the region temporal to the macula in the left eye with large regions of capillary nonperfusion which might be triggering the anterior segment neovascularization   Periphery  Good PRP and attached, some room anterior in the temporal retina for more PRP if disease reactivates            IMAGING AND PROCEDURES  Imaging and Procedures for 07/10/21  OCT, Retina - OU - Both Eyes       Right Eye Quality was good. Scan locations included subfoveal. Central Foveal Thickness: 329. Progression has been stable.   Left Eye Quality was good. Scan locations included subfoveal. Central Foveal Thickness: 324. Progression has been stable. Findings include vitreomacular adhesion .   Notes Small intraretinal CME, not pathologic OD     Color Fundus Photography Optos - OU - Both Eyes       Right Eye Progression has been stable. Disc findings include normal observations. Macula : microaneurysms.   Left Eye Progression has been stable. Macula : microaneurysms.   Notes Good prp 360, attached, clear media.  DR quiet OD  OS, good PRP 360 OS, clear media     Intravitreal Injection, Pharmacologic Agent - OS - Left Eye       Time Out 07/10/2021. 2:21 PM. Confirmed correct patient, procedure, site, and patient consented.   Anesthesia Topical anesthesia was used. Anesthetic medications included Lidocaine 4%.   Procedure Preparation included 5% betadine to ocular surface, 10% betadine to eyelids, Tobramycin 0.3%. A 30 gauge needle was used.   Injection: 2.5 mg bevacizumab 2.5 MG/0.1ML   Route: Intravitreal, Site: Left Eye   NDC: [phone], Lot:  [id number]   Post-op Post injection exam found visual acuity of at least counting fingers. The patient tolerated the procedure well. There were no complications. The patient received written and verbal post procedure care education. Post injection medications were not given.              ASSESSMENT/PLAN:  Stable treated proliferative diabetic retinopathy of right eye determined by examination associated with type 2 diabetes mellitus ([place]) OD looks great with no active disease, quiet vitrectomized eye  Proliferative diabetic retinopathy of left eye determined by examination ([place]) OS, quiescent PDR, yet  Not vitrectomized eye.  Nuclear sclerotic cataract of left eye Progression OS, proceed with cataract extraction as planned likely January, will need preoperatively however intravitreal Avastin control the iris neovascularization left eye  Rubeosis iridis of left eye We will commence with therapy OS today in order to prepare for upcoming cataract extraction intraocular lens placement left eye     ICD-10-CM   1. Proliferative diabetic retinopathy of left eye associated with type 2 diabetes mellitus, macular edema presence unspecified (HCC)  E11.3592 OCT, Retina - OU - Both Eyes    Color Fundus Photography Optos - OU - Both Eyes    Intravitreal Injection, Pharmacologic Agent - OS - Left Eye    bevacizumab (AVASTIN) SOSY 2.5 mg    2. Vitreous hemorrhage, left eye ([place])  H43.12 OCT, Retina - OU -  Both Eyes    Color Fundus Photography Optos - OU - Both Eyes    3. Stable treated proliferative diabetic retinopathy of right eye determined by examination associated with type 2 diabetes mellitus ([place])  T62.5638     4. Proliferative diabetic retinopathy of left eye determined by examination ([place])  L37.3428     5. Nuclear sclerotic cataract of left eye  H25.12     6. Rubeosis iridis of left eye  H21.1X2       1.  OS with remnants of iris rubeosis, still does not look active particularly  with good PRP posteriorly.  However will deliver Avastin OS today and then upon follow-up in 5 weeks we will look to see if iris rubeosis has in fact diminished.  2. Do not dilate prior to Dr. Dahlia Bailiff exam next  3.  Ophthalmic Meds Ordered this visit:  Meds ordered this encounter  Medications   bevacizumab (AVASTIN) SOSY 2.5 mg       Return in about 5 weeks (around 08/14/2021) for NO DILATE, Dr. Zadie Rhine to examine iris.  There are no Patient Instructions on file for this visit.   Explained the diagnoses, plan, and follow up with the patient and they expressed understanding.  Patient expressed understanding of the importance of proper follow up care.   Clent Demark Kenzee Bassin M.D. Diseases & Surgery of the Retina and Vitreous Retina & Diabetic [place] 07/10/21     Abbreviations: M myopia (nearsighted); A astigmatism; H hyperopia (farsighted); P presbyopia; Mrx spectacle prescription;  CTL contact lenses; OD right eye; OS left eye; OU both eyes  XT exotropia; ET esotropia; PEK punctate epithelial keratitis; PEE punctate epithelial erosions; DES dry eye syndrome; MGD meibomian gland dysfunction; ATs artificial tears; PFAT's preservative free artificial tears; [place] nuclear sclerotic cataract; PSC posterior subcapsular cataract; ERM epi-retinal membrane; PVD posterior vitreous detachment; RD retinal detachment; DM diabetes mellitus; DR diabetic retinopathy; NPDR non-proliferative diabetic retinopathy; PDR proliferative diabetic retinopathy; CSME clinically significant macular edema; DME diabetic macular edema; dbh dot blot hemorrhages; CWS cotton wool spot; POAG primary open angle glaucoma; C/D cup-to-disc ratio; HVF humphrey visual field; GVF goldmann visual field; OCT optical coherence tomography; IOP intraocular pressure; BRVO Branch retinal vein occlusion; CRVO central retinal vein occlusion; CRAO central retinal artery occlusion; BRAO branch retinal artery occlusion; RT retinal tear; SB  scleral buckle; PPV pars plana vitrectomy; VH Vitreous hemorrhage; PRP panretinal laser photocoagulation; IVK intravitreal kenalog; VMT vitreomacular traction; MH Macular hole;  NVD neovascularization of the disc; NVE neovascularization elsewhere; AREDS age related eye disease study; ARMD age related macular degeneration; POAG primary open angle glaucoma; EBMD epithelial/anterior basement membrane dystrophy; ACIOL anterior chamber intraocular lens; IOL intraocular lens; PCIOL posterior chamber intraocular lens; Phaco/IOL phacoemulsification with intraocular lens placement; [place] photorefractive keratectomy; LASIK laser assisted in situ keratomileusis; HTN hypertension; DM diabetes mellitus; COPD chronic obstructive pulmonary disease

## 2021-07-10 NOTE — Assessment & Plan Note (Signed)
We will commence with therapy OS today in order to prepare for upcoming cataract extraction intraocular lens placement left eye

## 2021-07-17 ENCOUNTER — Other Ambulatory Visit: Payer: Self-pay | Admitting: Internal Medicine

## 2021-07-19 ENCOUNTER — Telehealth: Payer: Self-pay

## 2021-07-19 NOTE — Telephone Encounter (Signed)
Mitchell Palmer, Ok to order 1 box of the 3 mg dose with 11 refills. When back in stock, need to return to the 4.5 mg dose. Thank you! C

## 2021-07-19 NOTE — Telephone Encounter (Signed)
Per patient Rx Trulicity 4.5 MG is on back order.   He has requested  Trulicity 3 MG to be sent to [facility] on [address]. They only have a few in stock.   Call back phone [phone]. (Dr. Cruzita Lederer parient)

## 2021-07-21 ENCOUNTER — Other Ambulatory Visit: Payer: Self-pay

## 2021-07-21 MED ORDER — TRULICITY 4.5 MG/0.5ML ~~LOC~~ SOAJ
4.5000 mg | SUBCUTANEOUS | 11 refills | Status: DC
Start: 2021-07-21 — End: 2021-12-04

## 2021-07-21 MED ORDER — TRULICITY 3 MG/0.5ML ~~LOC~~ SOAJ: 3.0000 mg | SUBCUTANEOUS | 2 refills | Status: DC

## 2021-07-21 NOTE — Telephone Encounter (Signed)
3 Mg rx sent to preferred pharmacy.

## 2021-07-21 NOTE — Telephone Encounter (Signed)
Script has been sent to the [facility]. It was originally sent to [facility].

## 2021-08-01 ENCOUNTER — Other Ambulatory Visit: Payer: Self-pay | Admitting: Internal Medicine

## 2021-08-03 ENCOUNTER — Encounter (INDEPENDENT_AMBULATORY_CARE_PROVIDER_SITE_OTHER): Payer: 59 | Admitting: Ophthalmology

## 2021-08-06 ENCOUNTER — Other Ambulatory Visit: Payer: Self-pay | Admitting: Internal Medicine

## 2021-08-15 ENCOUNTER — Encounter (INDEPENDENT_AMBULATORY_CARE_PROVIDER_SITE_OTHER): Payer: 59 | Admitting: Ophthalmology

## 2021-08-17 ENCOUNTER — Ambulatory Visit (INDEPENDENT_AMBULATORY_CARE_PROVIDER_SITE_OTHER): Payer: 59 | Admitting: Ophthalmology

## 2021-08-17 ENCOUNTER — Encounter (INDEPENDENT_AMBULATORY_CARE_PROVIDER_SITE_OTHER): Payer: Self-pay | Admitting: Ophthalmology

## 2021-08-17 ENCOUNTER — Other Ambulatory Visit: Payer: Self-pay

## 2021-08-17 DIAGNOSIS — H211X2 Other vascular disorders of iris and ciliary body, left eye: Secondary | ICD-10-CM | POA: Diagnosis not present

## 2021-08-17 DIAGNOSIS — H2512 Age-related nuclear cataract, left eye: Secondary | ICD-10-CM

## 2021-08-17 DIAGNOSIS — H4312 Vitreous hemorrhage, left eye: Secondary | ICD-10-CM | POA: Diagnosis not present

## 2021-08-17 NOTE — Assessment & Plan Note (Signed)
Needs cataract surgery, plan thus far with Dr. Warden Fillers August 31, 2021

## 2021-08-17 NOTE — Assessment & Plan Note (Signed)
Clearing OS Per patient will continue to observe

## 2021-08-17 NOTE — Progress Notes (Signed)
08/17/2021     CHIEF COMPLAINT Patient presents for  Chief Complaint  Patient presents with   Retina Follow Up      HISTORY OF PRESENT ILLNESS: Mitchell Palmer is a 55 y.o. male who presents to the clinic today for:   HPI     Retina Follow Up           Diagnosis: Diabetic Retinopathy   Laterality: left eye   Onset: 5 weeks ago   Severity: mild   Duration: 5 weeks   Course: stable         Comments   5 week fu, no dilation, GAR to evaluate iris for rubeosis.  Pt states VA OU stable since last visit. Pt denies FOL, floaters, or ocular pain OU.  Pt states, "All is the same as far as I can tell."  LBS: 249       Last edited by Kendra Opitz, COA on 08/17/2021  3:39 PM.      Referring physician: Warden Fillers, MD [address] [place],  [place] [address]  HISTORICAL INFORMATION:   Selected notes from the MEDICAL RECORD NUMBER    Lab Results  Component Value Date   HGBA1C 8.1 (A) 02/22/2021     CURRENT MEDICATIONS: No current outpatient medications on file. (Ophthalmic Drugs)   No current facility-administered medications for this visit. (Ophthalmic Drugs)   Current Outpatient Medications (Other)  Medication Sig   B-D UF III MINI PEN NEEDLES 31G X 5 MM MISC USE AS DIRECTED 4 TIMES A DAY   cyclobenzaprine (FLEXERIL) 10 MG tablet Take 1 tablet (10 mg total) by mouth 3 (three) times daily as needed.   Dulaglutide (TRULICITY) 3 KT/6.2BW SOPN Inject 3 mg as directed once a week.   Dulaglutide (TRULICITY) 4.5 LS/9.3TD SOPN Inject 4.5 mg as directed once a week.   empagliflozin (JARDIANCE) 25 MG TABS tablet Take 1 tablet (25 mg total) by mouth daily before breakfast.   fluticasone (FLONASE) 50 MCG/ACT nasal spray SPRAY 2 SPRAYS INTO EACH NOSTRIL EVERY DAY   insulin lispro (HUMALOG KWIKPEN) 100 UNIT/ML KwikPen INJECT 15-25 UNITS INTO THE SKIN 3 TIMES DAILY W/ MEALS AND 10-12 UNITS INTO THE SKIN W/ SNACKS   LANTUS SOLOSTAR 100 UNIT/ML Solostar  Pen INJECT 42 UNITS IN THE MORNING AND 50 UNITS IN THE EVENING   levothyroxine (SYNTHROID) 75 MCG tablet TAKE 1 TABLET BY MOUTH ONCE A DAY ON AN EMPTY STOMACH 30 MINUTES BEFORE EATING.   metFORMIN (GLUCOPHAGE) 1000 MG tablet TAKE 1 TABLET (1,000 MG TOTAL) BY MOUTH 2 (TWO) TIMES DAILY WITH A MEAL.   mometasone (ELOCON) 0.1 % cream Apply 1 application topically daily. To affected areas   omeprazole (PRILOSEC) 40 MG capsule Take 1 capsule (40 mg total) by mouth daily.   ONETOUCH ULTRA test strip USE TO TEST BLOOD SUGAR 4 TIMES DAILY AS DIRECTED   rosuvastatin (CRESTOR) 10 MG tablet Take 1 tablet (10 mg total) by mouth daily.   tadalafil (CIALIS) 20 MG tablet Take 1 tablet (20 mg total) by mouth as directed.   vitamin B-12 (CYANOCOBALAMIN) 1000 MCG tablet Take 1,000 mcg by mouth daily.   No current facility-administered medications for this visit. (Other)      REVIEW OF SYSTEMS:    ALLERGIES Allergies  Allergen Reactions   Atorvastatin     Muscle pain    Codeine     REACTION: anxious    PAST MEDICAL HISTORY Past Medical History:  Diagnosis Date  Allergy    allegic rhinitis   Diabetes mellitus    type II   Esophageal stricture    GERD (gastroesophageal reflux disease)    History of hernia repair    as a baby   Hyperlipidemia    Hyperplastic polyp of intestine 2010   Hypertension    Proliferative diabetic retinopathy of left eye ([place]) 03/31/2020   This is active as a patient still has tufts of the anterior segment neovascularization, rubeosis in the left eye every region of capillary nonperfusion should be treated with PRP.   S/P ear surgery, follow-up exam    for drainage   Vitreous hemorrhage of right eye ([place]) 12/24/2019   Past Surgical History:  Procedure Laterality Date   COLONOSCOPY     ESOPHAGOGASTRODUODENOSCOPY  04/2009   stricture/GERD   HERNIA REPAIR     age [place]   went in behind rt ear-blockage   POLYPECTOMY     SHOULDER  ARTHROSCOPY WITH ROTATOR CUFF REPAIR AND SUBACROMIAL DECOMPRESSION Right 09/29/2012   Procedure: SHOULDER ARTHROSCOPY WITH ROTATOR CUFF REPAIR AND SUBACROMIAL DECOMPRESSION;  Surgeon: Nita Sells, MD;  Location: [facility];  Service: Orthopedics;  Laterality: Right;  Right shoulder arthroscopy with subacromial decompression and distal clavicle excision & Debridement of Labrial Tear   UPPER GASTROINTESTINAL ENDOSCOPY      FAMILY HISTORY Family History  Problem Relation Age of Onset   Diabetes Mother    Colon cancer Father 57   Diabetes Other    Colon polyps Brother    Diabetes Maternal Aunt    Cancer Cousin        breast cancer    SOCIAL HISTORY Social History   Tobacco Use   Smoking status: Never   Smokeless tobacco: Never  Substance Use Topics   Alcohol use: Yes    Alcohol/week: 12.0 standard drinks    Types: 12 Standard drinks or equivalent per week    Comment: 6 pk every 3 days; 12 pk/week   Drug use: No         OPHTHALMIC EXAM:  Base Eye Exam     Visual Acuity (ETDRS)       Right Left   Dist cc 20/20 -2 20/20    Correction: Glasses         Tonometry (Tonopen, 3:42 PM)       Right Left   Pressure 16 13         Pupils       Pupils Shape React APD   Right PERRL Round Brisk None   Left PERRL Round Brisk None         Visual Fields (Counting fingers)       Left Right    Full Full         Extraocular Movement       Right Left    Full, Ortho Full, Ortho         Neuro/Psych     Oriented x3: Yes   Mood/Affect: Normal         Dilation     Both eyes: No Dilation per GAR @ 3:42 PM           Slit Lamp and Fundus Exam     External Exam       Right Left   External Normal Normal         Slit Lamp Exam       Right Left   Lids/Lashes Normal  Normal   Conjunctiva/Sclera White and quiet White and quiet   Cornea Clear Clear   Anterior Chamber Deep and quiet Deep and quiet   Iris Round and  reactive Round and reactive, visible remnants of iris neovascularization, undilated   Lens Posterior chamber intraocular lens,, , 1+ Posterior capsular opacification 2+ Nuclear sclerosis   Anterior Vitreous Normal Normal            IMAGING AND PROCEDURES  Imaging and Procedures for 08/17/21           ASSESSMENT/PLAN:  Vitreous hemorrhage, left eye (HCC) Clearing OS Per patient will continue to observe  Rubeosis iridis of left eye OS stable, not visible today undilated examination will observe  Nuclear sclerotic cataract of left eye Needs cataract surgery, plan thus far with Dr. Warden Fillers August 31, 2021     ICD-10-CM   1. Vitreous hemorrhage, left eye ([place])  H43.12     2. Rubeosis iridis of left eye  H21.1X2     3. Nuclear sclerotic cataract of left eye  H25.12       1.  Visual acuity OS continues to improve, as vitreous hemorrhage has cleared.  Now 1 month post injection intravitreal Avastin.  2.  Rubeosis is no longer present, will reassess once cataract surgery in the left eye is completed and visual media is cleared  3.  Ophthalmic Meds Ordered this visit:  No orders of the defined types were placed in this encounter.      Return in about 5 weeks (around 09/21/2021) for dilate, OS, OCT.  There are no Patient Instructions on file for this visit.   Explained the diagnoses, plan, and follow up with the patient and they expressed understanding.  Patient expressed understanding of the importance of proper follow up care.   Clent Demark Skila Rollins M.D. Diseases & Surgery of the Retina and Vitreous Retina & Diabetic [place] 08/17/21     Abbreviations: M myopia (nearsighted); A astigmatism; H hyperopia (farsighted); P presbyopia; Mrx spectacle prescription;  CTL contact lenses; OD right eye; OS left eye; OU both eyes  XT exotropia; ET esotropia; PEK punctate epithelial keratitis; PEE punctate epithelial erosions; DES dry eye syndrome; MGD meibomian  gland dysfunction; ATs artificial tears; PFAT's preservative free artificial tears; [place] nuclear sclerotic cataract; PSC posterior subcapsular cataract; ERM epi-retinal membrane; PVD posterior vitreous detachment; RD retinal detachment; DM diabetes mellitus; DR diabetic retinopathy; NPDR non-proliferative diabetic retinopathy; PDR proliferative diabetic retinopathy; CSME clinically significant macular edema; DME diabetic macular edema; dbh dot blot hemorrhages; CWS cotton wool spot; POAG primary open angle glaucoma; C/D cup-to-disc ratio; HVF humphrey visual field; GVF goldmann visual field; OCT optical coherence tomography; IOP intraocular pressure; BRVO Branch retinal vein occlusion; CRVO central retinal vein occlusion; CRAO central retinal artery occlusion; BRAO branch retinal artery occlusion; RT retinal tear; SB scleral buckle; PPV pars plana vitrectomy; VH Vitreous hemorrhage; PRP panretinal laser photocoagulation; IVK intravitreal kenalog; VMT vitreomacular traction; MH Macular hole;  NVD neovascularization of the disc; NVE neovascularization elsewhere; AREDS age related eye disease study; ARMD age related macular degeneration; POAG primary open angle glaucoma; EBMD epithelial/anterior basement membrane dystrophy; ACIOL anterior chamber intraocular lens; IOL intraocular lens; PCIOL posterior chamber intraocular lens; Phaco/IOL phacoemulsification with intraocular lens placement; [place] photorefractive keratectomy; LASIK laser assisted in situ keratomileusis; HTN hypertension; DM diabetes mellitus; COPD chronic obstructive pulmonary disease

## 2021-08-17 NOTE — Assessment & Plan Note (Signed)
OS stable, not visible today undilated examination will observe

## 2021-08-20 HISTORY — PX: KNEE ARTHROSCOPY: SHX127

## 2021-08-31 ENCOUNTER — Encounter (INDEPENDENT_AMBULATORY_CARE_PROVIDER_SITE_OTHER): Payer: Self-pay

## 2021-09-21 ENCOUNTER — Ambulatory Visit (INDEPENDENT_AMBULATORY_CARE_PROVIDER_SITE_OTHER): Payer: 59 | Admitting: Ophthalmology

## 2021-09-21 ENCOUNTER — Other Ambulatory Visit: Payer: Self-pay

## 2021-09-21 ENCOUNTER — Encounter (INDEPENDENT_AMBULATORY_CARE_PROVIDER_SITE_OTHER): Payer: Self-pay | Admitting: Ophthalmology

## 2021-09-21 DIAGNOSIS — E113551 Type 2 diabetes mellitus with stable proliferative diabetic retinopathy, right eye: Secondary | ICD-10-CM | POA: Diagnosis not present

## 2021-09-21 DIAGNOSIS — E113512 Type 2 diabetes mellitus with proliferative diabetic retinopathy with macular edema, left eye: Secondary | ICD-10-CM | POA: Diagnosis not present

## 2021-09-21 DIAGNOSIS — H43822 Vitreomacular adhesion, left eye: Secondary | ICD-10-CM

## 2021-09-21 DIAGNOSIS — H4312 Vitreous hemorrhage, left eye: Secondary | ICD-10-CM | POA: Diagnosis not present

## 2021-09-21 MED ORDER — BEVACIZUMAB 2.5 MG/0.1ML IZ SOSY
2.5000 mg | PREFILLED_SYRINGE | INTRAVITREAL | Status: AC | PRN
  Administered 2021-09-21: 2.5 mg via INTRAVITREAL

## 2021-09-21 NOTE — Assessment & Plan Note (Signed)
Treat as diabetic macular edema

## 2021-09-21 NOTE — Assessment & Plan Note (Signed)
New onset CSME, post recent cataract surgery, typical and common for diabetic retinopathy post cataract surgery.  Some role of VMA.

## 2021-09-21 NOTE — Progress Notes (Signed)
09/21/2021     CHIEF COMPLAINT Patient presents for  Chief Complaint  Patient presents with   Retina Follow Up      HISTORY OF PRESENT ILLNESS: Mitchell Palmer is a 56 y.o. male who presents to the clinic today for:   HPI     Retina Follow Up           Diagnosis: Other   Laterality: left eye   Onset: 5 weeks ago   Severity: mild   Duration: 5 weeks         Comments   5 week fu OS oct. Pt had cataract surgery OS on 08/31/21 by Dr. Katy Fitch. Pt states at the first post op he was seeing 20/20, and the 2nd post op vision had declined worse than 20/20. Pt states he notices now it is blurred and looking through a tinted glass. Pt is using two prescription eye drops, one is four times a day in the left eye and one is 2 times a day in the left eye, states antibiotic drops he finished a week after surgery.        Last edited by Mitchell Palmer on 09/21/2021  3:21 PM.      Referring physician: Tower, Mitchell Fanny, MD [place],  [place] [id number]  HISTORICAL INFORMATION:   Selected notes from the MEDICAL RECORD NUMBER    Lab Results  Component Value Date   HGBA1C 8.1 (A) 02/22/2021     CURRENT MEDICATIONS: No current outpatient medications on file. (Ophthalmic Drugs)   No current facility-administered medications for this visit. (Ophthalmic Drugs)   Current Outpatient Medications (Other)  Medication Sig   B-D UF III MINI PEN NEEDLES 31G X 5 MM MISC USE AS DIRECTED 4 TIMES A DAY   cyclobenzaprine (FLEXERIL) 10 MG tablet Take 1 tablet (10 mg total) by mouth 3 (three) times daily as needed.   Dulaglutide (TRULICITY) 3 MV/7.8IO SOPN Inject 3 mg as directed once a week.   Dulaglutide (TRULICITY) 4.5 NG/2.9BM SOPN Inject 4.5 mg as directed once a week.   empagliflozin (JARDIANCE) 25 MG TABS tablet Take 1 tablet (25 mg total) by mouth daily before breakfast.   fluticasone (FLONASE) 50 MCG/ACT nasal spray SPRAY 2 SPRAYS INTO EACH NOSTRIL EVERY DAY   insulin  lispro (HUMALOG KWIKPEN) 100 UNIT/ML KwikPen INJECT 15-25 UNITS INTO THE SKIN 3 TIMES DAILY W/ MEALS AND 10-12 UNITS INTO THE SKIN W/ SNACKS   LANTUS SOLOSTAR 100 UNIT/ML Solostar Pen INJECT 42 UNITS IN THE MORNING AND 50 UNITS IN THE EVENING   levothyroxine (SYNTHROID) 75 MCG tablet TAKE 1 TABLET BY MOUTH ONCE A DAY ON AN EMPTY STOMACH 30 MINUTES BEFORE EATING.   metFORMIN (GLUCOPHAGE) 1000 MG tablet TAKE 1 TABLET (1,000 MG TOTAL) BY MOUTH 2 (TWO) TIMES DAILY WITH A MEAL.   mometasone (ELOCON) 0.1 % cream Apply 1 application topically daily. To affected areas   omeprazole (PRILOSEC) 40 MG capsule Take 1 capsule (40 mg total) by mouth daily.   ONETOUCH ULTRA test strip USE TO TEST BLOOD SUGAR 4 TIMES DAILY AS DIRECTED   rosuvastatin (CRESTOR) 10 MG tablet Take 1 tablet (10 mg total) by mouth daily.   tadalafil (CIALIS) 20 MG tablet Take 1 tablet (20 mg total) by mouth as directed.   vitamin B-12 (CYANOCOBALAMIN) 1000 MCG tablet Take 1,000 mcg by mouth daily.   No current facility-administered medications for this visit. (Other)      REVIEW OF SYSTEMS:  ALLERGIES Allergies  Allergen Reactions   Atorvastatin     Muscle pain    Codeine     REACTION: anxious    PAST MEDICAL HISTORY Past Medical History:  Diagnosis Date   Allergy    allegic rhinitis   Diabetes mellitus    type II   Esophageal stricture    GERD (gastroesophageal reflux disease)    History of hernia repair    as a baby   Hyperlipidemia    Hyperplastic polyp of intestine 2010   Hypertension    Proliferative diabetic retinopathy of left eye ([place]) 03/31/2020   This is active as a patient still has tufts of the anterior segment neovascularization, rubeosis in the left eye every region of capillary nonperfusion should be treated with PRP.   S/P ear surgery, follow-up exam    for drainage   Vitreous hemorrhage of right eye ([place]) 12/24/2019   Past Surgical History:  Procedure Laterality Date   COLONOSCOPY      ESOPHAGOGASTRODUODENOSCOPY  04/2009   stricture/GERD   HERNIA REPAIR     age [age]   went in behind rt ear-blockage   POLYPECTOMY     SHOULDER ARTHROSCOPY WITH ROTATOR CUFF REPAIR AND SUBACROMIAL DECOMPRESSION Right 09/29/2012   Procedure: SHOULDER ARTHROSCOPY WITH ROTATOR CUFF REPAIR AND SUBACROMIAL DECOMPRESSION;  Surgeon: Nita Sells, MD;  Location: [facility];  Service: Orthopedics;  Laterality: Right;  Right shoulder arthroscopy with subacromial decompression and distal clavicle excision & Debridement of Labrial Tear   UPPER GASTROINTESTINAL ENDOSCOPY      FAMILY HISTORY Family History  Problem Relation Age of Onset   Diabetes Mother    Colon cancer Father 16   Diabetes Other    Colon polyps Brother    Diabetes Maternal Aunt    Cancer Cousin        breast cancer    SOCIAL HISTORY Social History   Tobacco Use   Smoking status: Never   Smokeless tobacco: Never  Substance Use Topics   Alcohol use: Yes    Alcohol/week: 12.0 standard drinks    Types: 12 Standard drinks or equivalent per week    Comment: 6 pk every 3 days; 12 pk/week   Drug use: No         OPHTHALMIC EXAM:  Base Eye Exam     Visual Acuity (ETDRS)       Right Left   Dist [place]  20/40 -2   Dist cc 20/20    Dist ph [place]  NI    Correction: Glasses  Pt states "when looking at the chart with the left eye it is like looking through a dirty mirror."        Tonometry (Tonopen, 3:23 PM)       Right Left   Pressure 17 14         Pupils       Pupils Dark Light APD   Right PERRL 4 3 None   Left PERRL 4 3 None         Visual Fields (Counting fingers)       Left Right    Full Full         Extraocular Movement       Right Left    Full Full         Neuro/Psych     Oriented x3: Yes   Mood/Affect: Normal         Dilation  Both eyes: 1.0% Mydriacyl @ 3:23 PM           Slit Lamp and Fundus Exam     External  Exam       Right Left   External Normal Normal         Slit Lamp Exam       Right Left   Lids/Lashes Normal Normal   Conjunctiva/Sclera White and quiet White and quiet   Cornea Clear Clear   Anterior Chamber Deep and quiet Deep and quiet   Iris Round and reactive Round and reactive, visible remnants of iris neovascularization, undilated   Lens Posterior chamber intraocular lens,, , 1+ Posterior capsular opacification 2+ Nuclear sclerosis   Anterior Vitreous Normal Normal         Fundus Exam       Right Left   Posterior Vitreous  no posterior vitreous detachment   Disc  Normal   C/D Ratio  0.2   Macula  Microaneurysms, Macular thickening, Mild clinically significant macular edema   Vessels  Proliferative diabetic retinopathy, with good PRP yet there are a temporal window anterior to the equator in the left eye in addition to the region temporal to the macula in the left eye with large regions of capillary nonperfusion which might be triggering the anterior segment neovascularization   Periphery  Good PRP and attached, some room anterior in the temporal retina for more PRP if disease reactivates, small NVE superior            IMAGING AND PROCEDURES  Imaging and Procedures for 09/21/21  OCT, Retina - OU - Both Eyes       Right Eye Quality was good. Scan locations included subfoveal. Central Foveal Thickness: 329. Progression has been stable.   Left Eye Quality was good. Scan locations included subfoveal. Central Foveal Thickness: 479. Progression has worsened. Findings include vitreomacular adhesion , cystoid macular edema.   Notes Small intraretinal CME, not pathologic OD.  Pathologic CME OS, with small serous subfoveal retinal detachment related to the CME and/or CSME.  We will treat with CME therapy topically NSAID but also has diabetic macular edema with intravitreal antivegF OS       Intravitreal Injection, Pharmacologic Agent - OS - Left Eye       Time  Out 09/21/2021. 4:04 PM. Confirmed correct patient, procedure, site, and patient consented.   Anesthesia Topical anesthesia was used. Anesthetic medications included Lidocaine 4%.   Procedure Preparation included 5% betadine to ocular surface, 10% betadine to eyelids, Tobramycin 0.3%. A 30 gauge needle was used.   Injection: 2.5 mg bevacizumab 2.5 MG/0.1ML   Route: Intravitreal, Site: Left Eye   NDC: [id number], Lot: [id number]   Post-op Post injection exam found visual acuity of at least counting fingers. The patient tolerated the procedure well. There were no complications. The patient received written and verbal post procedure care education. Post injection medications were not given.              ASSESSMENT/PLAN:  Proliferative diabetic retinopathy of left eye with macular edema associated with type 2 diabetes mellitus ([place]) New onset CSME, post recent cataract surgery, typical and common for diabetic retinopathy post cataract surgery.  Some role of VMA.  Vitreomacular adhesion of left eye Treat as diabetic macular edema      ICD-10-CM   1. Proliferative diabetic retinopathy of left eye with macular edema associated with type 2 diabetes mellitus (H[facility])  N62.9528 OCT, Retina - OU - Both Eyes  Intravitreal Injection, Pharmacologic Agent - OS - Left Eye    bevacizumab (AVASTIN) SOSY 2.5 mg    2. Vitreous hemorrhage, left eye (HCC)  H43.12     3. Stable treated proliferative diabetic retinopathy of right eye determined by examination associated with type 2 diabetes mellitus ([place])  Z61.0960     4. Vitreomacular adhesion of left eye  H43.822       1.  OS, new onset diabetic CSME possible pseudophakic CME however patient is on topical NSAID, ketorolac without topical steroid.  This will be continued in case pseudophakic CME is playing some component  2.  OS most likely diabetic macular edema we will treat with antivegF to stabilize entire condition.,  Commence antivegF  Avastin today OS follow-up in 5 to 6 weeks  3.  Ophthalmic Meds Ordered this visit:  Meds ordered this encounter  Medications   bevacizumab (AVASTIN) SOSY 2.5 mg       Return in about 5 weeks (around 10/26/2021) for dilate, OS, AVASTIN OCT.  There are no Patient Instructions on file for this visit.   Explained the diagnoses, plan, and follow up with the patient and they expressed understanding.  Patient expressed understanding of the importance of proper follow up care.   Clent Demark Mikah Rottinghaus M.D. Diseases & Surgery of the Retina and Vitreous Retina & Diabetic [place] 09/21/21     Abbreviations: M myopia (nearsighted); A astigmatism; H hyperopia (farsighted); P presbyopia; Mrx spectacle prescription;  CTL contact lenses; OD right eye; OS left eye; OU both eyes  XT exotropia; ET esotropia; PEK punctate epithelial keratitis; PEE punctate epithelial erosions; DES dry eye syndrome; MGD meibomian gland dysfunction; ATs artificial tears; PFAT's preservative free artificial tears; [place] nuclear sclerotic cataract; PSC posterior subcapsular cataract; ERM epi-retinal membrane; PVD posterior vitreous detachment; RD retinal detachment; DM diabetes mellitus; DR diabetic retinopathy; NPDR non-proliferative diabetic retinopathy; PDR proliferative diabetic retinopathy; CSME clinically significant macular edema; DME diabetic macular edema; dbh dot blot hemorrhages; CWS cotton wool spot; POAG primary open angle glaucoma; C/D cup-to-disc ratio; HVF humphrey visual field; GVF goldmann visual field; OCT optical coherence tomography; IOP intraocular pressure; BRVO Branch retinal vein occlusion; CRVO central retinal vein occlusion; CRAO central retinal artery occlusion; BRAO branch retinal artery occlusion; RT retinal tear; SB scleral buckle; PPV pars plana vitrectomy; VH Vitreous hemorrhage; PRP panretinal laser photocoagulation; IVK intravitreal kenalog; VMT vitreomacular traction; MH Macular hole;  NVD  neovascularization of the disc; NVE neovascularization elsewhere; AREDS age related eye disease study; ARMD age related macular degeneration; POAG primary open angle glaucoma; EBMD epithelial/anterior basement membrane dystrophy; ACIOL anterior chamber intraocular lens; IOL intraocular lens; PCIOL posterior chamber intraocular lens; Phaco/IOL phacoemulsification with intraocular lens placement; [place] photorefractive keratectomy; LASIK laser assisted in situ keratomileusis; HTN hypertension; DM diabetes mellitus; COPD chronic obstructive pulmonary disease

## 2021-09-26 ENCOUNTER — Other Ambulatory Visit: Payer: Self-pay | Admitting: Family Medicine

## 2021-09-28 ENCOUNTER — Telehealth: Payer: Self-pay | Admitting: Gastroenterology

## 2021-09-28 NOTE — Telephone Encounter (Signed)
See 01/02/2016 office note. His EGD in 2017 was performed for dysphagia. We don't typically repeat the EGD unless there are active or uncontrolled UGI symptoms, like dysphagia. If his UGI symptoms are controlled then only colonoscopy is needed.

## 2021-09-28 NOTE — Telephone Encounter (Signed)
Good Afternoon Dr. Fuller Plan,   Patient called to make appointment for Colonoscopy and was scheduled for 3/27 at 8:30. Patient stated that normally he gets both Colon and Endo done at the same time but I only seen a recall for colon. Can he be scheduled for both? Or just colon?  Please advise.

## 2021-09-29 NOTE — Telephone Encounter (Signed)
Left message for patient to return my call.

## 2021-09-29 NOTE — Telephone Encounter (Signed)
Continue omeprazole 40 mg po qd. For intermittent solid food dysphagia please add EGD with possible dilation to colonoscopy.

## 2021-09-29 NOTE — Telephone Encounter (Signed)
Informed patient of Dr. Lynne Leader recommendations and that we added the EGD. Patient verbalized understanding.

## 2021-09-29 NOTE — Telephone Encounter (Signed)
Added EGD to colonoscopy already scheduled on 11/13/21. Left message for patient to return my call.

## 2021-09-29 NOTE — Telephone Encounter (Signed)
Patient returned my call and states he would like an EGD with his colonoscopy since this is what has been done in the past. Asked patient if he was having any trouble swallowing or reflux symptoms. Patient states he does have intermittent dysphagia to solid foods. That he has a hx of dilations. Also, patient reports if he lays down to soon after eating he will have severe reflux with belching. Please advise Dr. Fuller Plan if you want to add EGD to colon already scheduled or cancel procedures and schedule patient for an appointment.

## 2021-10-01 ENCOUNTER — Other Ambulatory Visit: Payer: Self-pay | Admitting: Family Medicine

## 2021-10-02 NOTE — Telephone Encounter (Signed)
Pt is overdue for his CPE/ labs prior, please schedule and then route back to me to refill

## 2021-10-03 NOTE — Telephone Encounter (Signed)
Pt scheduled cpe/lab in march

## 2021-10-05 ENCOUNTER — Other Ambulatory Visit: Payer: Self-pay | Admitting: Internal Medicine

## 2021-10-10 ENCOUNTER — Encounter: Payer: 59 | Admitting: Family Medicine

## 2021-10-13 ENCOUNTER — Other Ambulatory Visit: Payer: Self-pay | Admitting: Family Medicine

## 2021-10-13 ENCOUNTER — Telehealth: Payer: Self-pay | Admitting: *Deleted

## 2021-10-13 NOTE — Telephone Encounter (Signed)
Dr.Stark,  Per Osvaldo Angst the patient has difficult intubation and needs ECL done at hospital. [facility] for direct hospital or OV? Please advise. Thank you, Mairany Bruno pv

## 2021-10-13 NOTE — Telephone Encounter (Signed)
John,  Please review. Okay for [place]? Last EGD/colonoscopy was done at [facility] with Man[place] 2017.  Thanks,Trampas Stettner PV

## 2021-10-15 NOTE — Telephone Encounter (Signed)
OK for a direct colonoscopy and EGD/dilation at the hospital.

## 2021-10-16 NOTE — Telephone Encounter (Signed)
Dr. Fuller Plan has an opening on 12/07/21. Will call patient to switch procedures to hospital.

## 2021-10-17 ENCOUNTER — Other Ambulatory Visit: Payer: Self-pay

## 2021-10-17 DIAGNOSIS — Z800 Family history of malignant neoplasm of digestive organs: Secondary | ICD-10-CM

## 2021-10-17 DIAGNOSIS — K222 Esophageal obstruction: Secondary | ICD-10-CM

## 2021-10-17 NOTE — Telephone Encounter (Signed)
Interscalene brachial plexus block Procedure Name: Intubation Date/Time: 09/29/2012 11:47 AM Performed by: Lieutenant Diego Pre-anesthesia Checklist: Patient identified, Emergency Drugs available, Suction available and Patient being monitored Patient Re-evaluated:Patient Re-evaluated prior to inductionOxygen Delivery Method: Circle System Utilized Preoxygenation: Pre-oxygenation with 100% oxygen Intubation Type: IV induction Ventilation: Two handed mask ventilation required Laryngoscope Size: Miller, 2, Mac and 3 (AC with Mil, KO with MAC) Grade View: Grade II Tube type: Oral Tube size: 8.0 mm Number of attempts: 3 Airway Equipment and Method: stylet and oral airway Placement Confirmation: ETT inserted through vocal cords under direct vision,  positive ETCO2 and breath sounds checked- equal and bilateral Secured at: 24 cm Tube secured with: Tape Dental Injury: Teeth and Oropharynx as per pre-operative assessment  Difficulty Due To: Difficult Airway- due to reduced neck mobility, Difficult Airway- due to large tongue and Difficult Airway- due to limited oral opening Future Recommendations: Recommend- induction with short-acting agent, and alternative techniques readily available Comments: AC DL with Miller 2 unable to view cords. KO DL wilth MAC 3, unable to pass ett. Used Glidescope, good view of cords, ett passes with ease. =BBS, + eTCO2. ALways able to ventilate with oral airway and two handed mask airway.

## 2021-10-17 NOTE — Telephone Encounter (Addendum)
Colon/EGD rescheduled to 12/07/21 at 9:30 am at [facility]. Explained to patient he needed to have his procedures at the hospital due to difficult intubation.Informed patient I have already set up his procedures. Patient states he is about to leave for a PT session and will need to call me back.

## 2021-10-17 NOTE — Telephone Encounter (Signed)
Patient wanted to know details on why he had difficult intubation from shoulder arthroscopy from 2014. Read anesthesia notes to patient and pasted them in telephone encounters.   Patient verbalized understanding. New instructions for patient put on my chart for review. Told patient to call back with any questions.

## 2021-10-26 ENCOUNTER — Ambulatory Visit (INDEPENDENT_AMBULATORY_CARE_PROVIDER_SITE_OTHER): Payer: 59 | Admitting: Ophthalmology

## 2021-10-26 ENCOUNTER — Encounter (INDEPENDENT_AMBULATORY_CARE_PROVIDER_SITE_OTHER): Payer: Self-pay | Admitting: Ophthalmology

## 2021-10-26 ENCOUNTER — Other Ambulatory Visit: Payer: Self-pay | Admitting: Family Medicine

## 2021-10-26 ENCOUNTER — Other Ambulatory Visit: Payer: Self-pay

## 2021-10-26 ENCOUNTER — Telehealth: Payer: Self-pay | Admitting: Family Medicine

## 2021-10-26 DIAGNOSIS — E113512 Type 2 diabetes mellitus with proliferative diabetic retinopathy with macular edema, left eye: Secondary | ICD-10-CM | POA: Diagnosis not present

## 2021-10-26 DIAGNOSIS — E113551 Type 2 diabetes mellitus with stable proliferative diabetic retinopathy, right eye: Secondary | ICD-10-CM

## 2021-10-26 DIAGNOSIS — R7989 Other specified abnormal findings of blood chemistry: Secondary | ICD-10-CM

## 2021-10-26 DIAGNOSIS — Z0000 Encounter for general adult medical examination without abnormal findings: Secondary | ICD-10-CM

## 2021-10-26 DIAGNOSIS — Z79899 Other long term (current) drug therapy: Secondary | ICD-10-CM

## 2021-10-26 DIAGNOSIS — H4312 Vitreous hemorrhage, left eye: Secondary | ICD-10-CM | POA: Diagnosis not present

## 2021-10-26 DIAGNOSIS — I10 Essential (primary) hypertension: Secondary | ICD-10-CM

## 2021-10-26 DIAGNOSIS — E785 Hyperlipidemia, unspecified: Secondary | ICD-10-CM

## 2021-10-26 DIAGNOSIS — Z961 Presence of intraocular lens: Secondary | ICD-10-CM

## 2021-10-26 DIAGNOSIS — Z114 Encounter for screening for human immunodeficiency virus [HIV]: Secondary | ICD-10-CM | POA: Insufficient documentation

## 2021-10-26 DIAGNOSIS — Z125 Encounter for screening for malignant neoplasm of prostate: Secondary | ICD-10-CM

## 2021-10-26 DIAGNOSIS — H43822 Vitreomacular adhesion, left eye: Secondary | ICD-10-CM

## 2021-10-26 DIAGNOSIS — E039 Hypothyroidism, unspecified: Secondary | ICD-10-CM

## 2021-10-26 LAB — HM DIABETES EYE EXAM

## 2021-10-26 MED ORDER — BEVACIZUMAB 2.5 MG/0.1ML IZ SOSY
2.5000 mg | PREFILLED_SYRINGE | INTRAVITREAL | Status: AC | PRN
  Administered 2021-10-26: 16:00:00 2.5 mg via INTRAVITREAL

## 2021-10-26 NOTE — Progress Notes (Signed)
10/26/2021     CHIEF COMPLAINT Patient presents for  Chief Complaint  Patient presents with   Diabetic Retinopathy with Macular Edema      HISTORY OF PRESENT ILLNESS: Mitchell Palmer is a 56 y.o. male who presents to the clinic today for:   HPI   5 week for dilate, OS AVASTIN OCT. Pt states "There has been a little improvement in my left eye." Pt denies FOL but still sees floaters here and there.    Last edited by Silvestre Moment on 10/26/2021  3:34 PM.      Referring physician: Tower, Wynelle Fanny, MD [place],  [place] [address]  HISTORICAL INFORMATION:   Selected notes from the MEDICAL RECORD NUMBER    Lab Results  Component Value Date   HGBA1C 8.1 (A) 02/22/2021     CURRENT MEDICATIONS: No current outpatient medications on file. (Ophthalmic Drugs)   No current facility-administered medications for this visit. (Ophthalmic Drugs)   Current Outpatient Medications (Other)  Medication Sig   B-D UF III MINI PEN NEEDLES 31G X 5 MM MISC USE AS DIRECTED 4 TIMES A DAY   cyclobenzaprine (FLEXERIL) 10 MG tablet Take 1 tablet (10 mg total) by mouth 3 (three) times daily as needed.   Dulaglutide (TRULICITY) 3 HL/4.5GY SOPN Inject 3 mg as directed once a week.   Dulaglutide (TRULICITY) 4.5 BW/3.8LH SOPN Inject 4.5 mg as directed once a week.   empagliflozin (JARDIANCE) 25 MG TABS tablet Take 1 tablet (25 mg total) by mouth daily before breakfast.   fluticasone (FLONASE) 50 MCG/ACT nasal spray SPRAY 2 SPRAYS INTO EACH NOSTRIL EVERY DAY   insulin lispro (HUMALOG KWIKPEN) 100 UNIT/ML KwikPen INJECT 15-25 UNITS INTO THE SKIN 3 TIMES DAILY W/ MEALS AND 10-12 UNITS INTO THE SKIN W/ SNACKS   LANTUS SOLOSTAR 100 UNIT/ML Solostar Pen INJECT 42 UNITS IN THE MORNING AND 50 UNITS IN THE EVENING   levothyroxine (SYNTHROID) 75 MCG tablet TAKE 1 TABLET BY MOUTH ONCE A DAY ON AN EMPTY STOMACH 30 MINUTES BEFORE EATING.   metFORMIN (GLUCOPHAGE) 1000 MG tablet TAKE 1 TABLET (1,000 MG  TOTAL) BY MOUTH 2 (TWO) TIMES DAILY WITH A MEAL.   mometasone (ELOCON) 0.1 % cream Apply 1 application topically daily. To affected areas   olmesartan (BENICAR) 40 MG tablet TAKE 1 TABLET BY MOUTH EVERY DAY   omeprazole (PRILOSEC) 40 MG capsule TAKE 1 CAPSULE (40 MG TOTAL) BY MOUTH DAILY.   ONETOUCH ULTRA test strip USE TO TEST BLOOD SUGAR 4 TIMES DAILY AS DIRECTED   rosuvastatin (CRESTOR) 10 MG tablet Take 1 tablet (10 mg total) by mouth daily.   tadalafil (CIALIS) 20 MG tablet Take 1 tablet (20 mg total) by mouth as directed.   vitamin B-12 (CYANOCOBALAMIN) 1000 MCG tablet Take 1,000 mcg by mouth daily.   No current facility-administered medications for this visit. (Other)      REVIEW OF SYSTEMS: ROS   Negative for: Constitutional, Gastrointestinal, Neurological, Skin, Genitourinary, Musculoskeletal, HENT, Endocrine, Cardiovascular, Eyes, Respiratory, Psychiatric, Allergic/Imm, Heme/Lymph Last edited by Silvestre Moment on 10/26/2021  3:34 PM.       ALLERGIES Allergies  Allergen Reactions   Atorvastatin     Muscle pain    Codeine     REACTION: anxious    PAST MEDICAL HISTORY Past Medical History:  Diagnosis Date   Allergy    allegic rhinitis   Diabetes mellitus    type II   Esophageal stricture    GERD (gastroesophageal  reflux disease)    History of hernia repair    as a baby   Hyperlipidemia    Hyperplastic polyp of intestine 2010   Hypertension    Proliferative diabetic retinopathy of left eye ([place]) 03/31/2020   This is active as a patient still has tufts of the anterior segment neovascularization, rubeosis in the left eye every region of capillary nonperfusion should be treated with PRP.   S/P ear surgery, follow-up exam    for drainage   Vitreous hemorrhage of right eye ([place]) 12/24/2019   Past Surgical History:  Procedure Laterality Date   COLONOSCOPY     ESOPHAGOGASTRODUODENOSCOPY  04/2009   stricture/GERD   HERNIA REPAIR     age [place]    went in behind rt ear-blockage   POLYPECTOMY     SHOULDER ARTHROSCOPY WITH ROTATOR CUFF REPAIR AND SUBACROMIAL DECOMPRESSION Right 09/29/2012   Procedure: SHOULDER ARTHROSCOPY WITH ROTATOR CUFF REPAIR AND SUBACROMIAL DECOMPRESSION;  Surgeon: Nita Sells, MD;  Location: [facility];  Service: Orthopedics;  Laterality: Right;  Right shoulder arthroscopy with subacromial decompression and distal clavicle excision & Debridement of Labrial Tear   UPPER GASTROINTESTINAL ENDOSCOPY      FAMILY HISTORY Family History  Problem Relation Age of Onset   Diabetes Mother    Colon cancer Father 65   Diabetes Other    Colon polyps Brother    Diabetes Maternal Aunt    Cancer Cousin        breast cancer    SOCIAL HISTORY Social History   Tobacco Use   Smoking status: Never   Smokeless tobacco: Never  Substance Use Topics   Alcohol use: Yes    Alcohol/week: 12.0 standard drinks    Types: 12 Standard drinks or equivalent per week    Comment: 6 pk every 3 days; 12 pk/week   Drug use: No         OPHTHALMIC EXAM:  Base Eye Exam     Visual Acuity (ETDRS)       Right Left   Dist  20/20 20/20         Tonometry (Tonopen, 3:40 PM)       Right Left   Pressure 16 12         Pupils       Pupils Dark Light Shape React APD   Right PERRL 3 2 Round Minimal None   Left PERRL 3 2 Round Minimal None         Visual Fields       Left Right    Full Full         Extraocular Movement       Right Left    Full Full         Neuro/Psych     Oriented x3: Yes   Mood/Affect: Normal         Dilation     Both eyes: 1.0% Mydriacyl, 2.5% Phenylephrine @ 3:40 PM           Slit Lamp and Fundus Exam     External Exam       Right Left   External Normal Normal         Slit Lamp Exam       Right Left   Lids/Lashes Normal Normal   Conjunctiva/Sclera White and quiet White and quiet   Cornea Clear Clear   Anterior Chamber Deep and quiet  Deep and quiet  Iris Round and reactive Round and reactive, visible remnants of iris neovascularization, undilated   Lens Posterior chamber intraocular lens,, , 1+ Posterior capsular opacification Posterior chamber intraocular lens,, , 1+ Posterior capsular opacification   Anterior Vitreous Normal Normal         Fundus Exam       Right Left   Posterior Vitreous  no posterior vitreous detachment   Disc  Normal   C/D Ratio  0.2   Macula  Microaneurysms, Macular thickening, Mild clinically significant macular edema   Vessels  Proliferative diabetic retinopathy, with good PRP yet there are a temporal window anterior to the equator in the left eye in addition to the region temporal to the macula in the left eye with large regions of capillary nonperfusion which might be triggering the anterior segment neovascularization   Periphery  Good PRP and attached, some room anterior in the temporal retina for more PRP if disease reactivates, small NVE superior            IMAGING AND PROCEDURES  Imaging and Procedures for 10/26/21  OCT, Retina - OU - Both Eyes       Right Eye Quality was good. Scan locations included subfoveal. Central Foveal Thickness: 329. Progression has been stable.   Left Eye Quality was good. Scan locations included subfoveal. Central Foveal Thickness: 324. Progression has worsened. Findings include vitreomacular adhesion , cystoid macular edema.   Notes Small intraretinal CME, not pathologic OD.  OS vastly improved CSME post injection Avastin 5 weeks previous, will repeat injection today to maintain extend interval examination next               ASSESSMENT/PLAN:  Stable treated proliferative diabetic retinopathy of right eye determined by examination associated with type 2 diabetes mellitus ([place]) Quiescent PDR, no signs of CSME  Proliferative diabetic retinopathy of left eye with macular edema associated with type 2 diabetes mellitus ([place]) OS looks  great, vastly improved post most recent injection CSME repeat today extend interval examination next to 6 weeks  Vitreomacular adhesion of left eye OS physiologic, not impactful vision  Vitreous hemorrhage, left eye (HCC) Minor clearing  Pseudophakia, left eye OS looks great  Pseudophakia, right eye Looks great OU     ICD-10-CM   1. Proliferative diabetic retinopathy of left eye with macular edema associated with type 2 diabetes mellitus (HCC)  F81.8299 OCT, Retina - OU - Both Eyes    Intravitreal Injection, Pharmacologic Agent - OS - Left Eye    2. Stable treated proliferative diabetic retinopathy of right eye determined by examination associated with type 2 diabetes mellitus ([place])  B71.6967     3. Vitreomacular adhesion of left eye  H43.822     4. Vitreous hemorrhage, left eye ([place])  H43.12     5. Pseudophakia, left eye  Z96.1     6. Pseudophakia, right eye  Z96.1       1.  Diabetic CSME OS vastly improved overall post injection Avastin early February 2023, repeat injection today and extend interval examination next to 7 weeks  2.  OU quiescent PDR  3.  Ophthalmic Meds Ordered this visit:  No orders of the defined types were placed in this encounter.      Return in about 7 weeks (around 12/14/2021) for dilate, OS, AVASTIN OCT.  There are no Patient Instructions on file for this visit.   Explained the diagnoses, plan, and follow up with the patient and they expressed understanding.  Patient expressed understanding  of the importance of proper follow up care.   Clent Demark Jalayne Ganesh M.D. Diseases & Surgery of the Retina and Vitreous Retina & Diabetic [place] 10/26/21     Abbreviations: M myopia (nearsighted); A astigmatism; H hyperopia (farsighted); P presbyopia; Mrx spectacle prescription;  CTL contact lenses; OD right eye; OS left eye; OU both eyes  XT exotropia; ET esotropia; PEK punctate epithelial keratitis; PEE punctate epithelial erosions; DES dry eye  syndrome; MGD meibomian gland dysfunction; ATs artificial tears; PFAT's preservative free artificial tears; [place] nuclear sclerotic cataract; PSC posterior subcapsular cataract; ERM epi-retinal membrane; PVD posterior vitreous detachment; RD retinal detachment; DM diabetes mellitus; DR diabetic retinopathy; NPDR non-proliferative diabetic retinopathy; PDR proliferative diabetic retinopathy; CSME clinically significant macular edema; DME diabetic macular edema; dbh dot blot hemorrhages; CWS cotton wool spot; POAG primary open angle glaucoma; C/D cup-to-disc ratio; HVF humphrey visual field; GVF goldmann visual field; OCT optical coherence tomography; IOP intraocular pressure; BRVO Branch retinal vein occlusion; CRVO central retinal vein occlusion; CRAO central retinal artery occlusion; BRAO branch retinal artery occlusion; RT retinal tear; SB scleral buckle; PPV pars plana vitrectomy; VH Vitreous hemorrhage; PRP panretinal laser photocoagulation; IVK intravitreal kenalog; VMT vitreomacular traction; MH Macular hole;  NVD neovascularization of the disc; NVE neovascularization elsewhere; AREDS age related eye disease study; ARMD age related macular degeneration; POAG primary open angle glaucoma; EBMD epithelial/anterior basement membrane dystrophy; ACIOL anterior chamber intraocular lens; IOL intraocular lens; PCIOL posterior chamber intraocular lens; Phaco/IOL phacoemulsification with intraocular lens placement; [place] photorefractive keratectomy; LASIK laser assisted in situ keratomileusis; HTN hypertension; DM diabetes mellitus; COPD chronic obstructive pulmonary disease

## 2021-10-26 NOTE — Assessment & Plan Note (Signed)
OS looks great, vastly improved post most recent injection CSME repeat today extend interval examination next to 6 weeks ?

## 2021-10-26 NOTE — Telephone Encounter (Signed)
-----   Message from Ellamae Sia sent at 10/18/2021  3:11 PM EST ----- ?Regarding: Lab orders for Friday, 3.10.23 ?Patient is scheduled for CPX labs, please order future labs, Thanks , Terri ? ? ?

## 2021-10-26 NOTE — Assessment & Plan Note (Addendum)
Looks great OU 

## 2021-10-26 NOTE — Assessment & Plan Note (Signed)
Quiescent PDR, no signs of CSME ?

## 2021-10-26 NOTE — Assessment & Plan Note (Signed)
OS physiologic, not impactful vision ?

## 2021-10-26 NOTE — Assessment & Plan Note (Signed)
Minor clearing ?

## 2021-10-26 NOTE — Assessment & Plan Note (Signed)
OS looks great ?

## 2021-10-27 ENCOUNTER — Other Ambulatory Visit (INDEPENDENT_AMBULATORY_CARE_PROVIDER_SITE_OTHER): Payer: 59

## 2021-10-27 DIAGNOSIS — E1169 Type 2 diabetes mellitus with other specified complication: Secondary | ICD-10-CM | POA: Diagnosis not present

## 2021-10-27 DIAGNOSIS — Z114 Encounter for screening for human immunodeficiency virus [HIV]: Secondary | ICD-10-CM

## 2021-10-27 DIAGNOSIS — R7989 Other specified abnormal findings of blood chemistry: Secondary | ICD-10-CM | POA: Diagnosis not present

## 2021-10-27 DIAGNOSIS — Z125 Encounter for screening for malignant neoplasm of prostate: Secondary | ICD-10-CM | POA: Diagnosis not present

## 2021-10-27 DIAGNOSIS — E785 Hyperlipidemia, unspecified: Secondary | ICD-10-CM | POA: Diagnosis not present

## 2021-10-27 DIAGNOSIS — Z79899 Other long term (current) drug therapy: Secondary | ICD-10-CM | POA: Diagnosis not present

## 2021-10-27 DIAGNOSIS — E039 Hypothyroidism, unspecified: Secondary | ICD-10-CM

## 2021-10-27 DIAGNOSIS — I10 Essential (primary) hypertension: Secondary | ICD-10-CM

## 2021-10-27 LAB — CBC WITH DIFFERENTIAL/PLATELET
Basophils Absolute: 0 10*3/uL (ref 0.0–0.1)
Basophils Relative: 0.7 % (ref 0.0–3.0)
Eosinophils Absolute: 0.3 10*3/uL (ref 0.0–0.7)
Eosinophils Relative: 3.7 % (ref 0.0–5.0)
HCT: 45.7 % (ref 39.0–52.0)
Hemoglobin: 16.2 g/dL (ref 13.0–17.0)
Lymphocytes Relative: 17.8 % (ref 12.0–46.0)
Lymphs Abs: 1.3 10*3/uL (ref 0.7–4.0)
MCHC: 35.4 g/dL (ref 30.0–36.0)
MCV: 98.9 fl (ref 78.0–100.0)
Monocytes Absolute: 0.6 10*3/uL (ref 0.1–1.0)
Monocytes Relative: 8.5 % (ref 3.0–12.0)
Neutro Abs: 5.1 10*3/uL (ref 1.4–7.7)
Neutrophils Relative %: 69.3 % (ref 43.0–77.0)
Platelets: 244 10*3/uL (ref 150.0–400.0)
RBC: 4.63 Mil/uL (ref 4.22–5.81)
RDW: 12.7 % (ref 11.5–15.5)
WBC: 7.4 10*3/uL (ref 4.0–10.5)

## 2021-10-27 LAB — COMPREHENSIVE METABOLIC PANEL
ALT: 28 U/L (ref 0–53)
AST: 22 U/L (ref 0–37)
Albumin: 4.2 g/dL (ref 3.5–5.2)
Alkaline Phosphatase: 79 U/L (ref 39–117)
BUN: 14 mg/dL (ref 6–23)
CO2: 29 mEq/L (ref 19–32)
Calcium: 9.3 mg/dL (ref 8.4–10.5)
Chloride: 101 mEq/L (ref 96–112)
Creatinine, Ser: 1.12 mg/dL (ref 0.40–1.50)
GFR: 74.41 mL/min (ref >60.00)
Glucose, Bld: 178 mg/dL — ABNORMAL HIGH (ref 70–99)
Potassium: 5 mEq/L (ref 3.5–5.1)
Sodium: 140 mEq/L (ref 135–145)
Total Bilirubin: 0.5 mg/dL (ref 0.2–1.2)
Total Protein: 6.4 g/dL (ref 6.0–8.3)

## 2021-10-27 LAB — LIPID PANEL
Cholesterol: 133 mg/dL (ref 0–200)
HDL: 45.5 mg/dL (ref >39.00)
NonHDL: 87.15
Total CHOL/HDL Ratio: 3
Triglycerides: 210 mg/dL — ABNORMAL HIGH (ref 0.0–149.0)
VLDL: 42 mg/dL — ABNORMAL HIGH (ref 0.0–40.0)

## 2021-10-27 LAB — PSA: PSA: 0.66 ng/mL (ref 0.10–4.00)

## 2021-10-27 LAB — TSH: TSH: 4.19 u[IU]/mL (ref 0.35–5.50)

## 2021-10-27 LAB — VITAMIN B12: Vitamin B-12: 274 pg/mL (ref 211–911)

## 2021-10-27 LAB — VITAMIN D 25 HYDROXY (VIT D DEFICIENCY, FRACTURES): VITD: 29.81 ng/mL — ABNORMAL LOW (ref 30.00–100.00)

## 2021-10-27 LAB — LDL CHOLESTEROL, DIRECT: Direct LDL: 63 mg/dL

## 2021-10-28 ENCOUNTER — Other Ambulatory Visit: Payer: Self-pay | Admitting: Internal Medicine

## 2021-10-30 ENCOUNTER — Other Ambulatory Visit: Payer: Self-pay

## 2021-10-30 ENCOUNTER — Ambulatory Visit (AMBULATORY_SURGERY_CENTER): Payer: 59 | Admitting: *Deleted

## 2021-10-30 VITALS — Ht 68.0 in | Wt 225.0 lb

## 2021-10-30 DIAGNOSIS — K222 Esophageal obstruction: Secondary | ICD-10-CM

## 2021-10-30 DIAGNOSIS — Z800 Family history of malignant neoplasm of digestive organs: Secondary | ICD-10-CM

## 2021-10-30 DIAGNOSIS — R1310 Dysphagia, unspecified: Secondary | ICD-10-CM

## 2021-10-30 LAB — HIV ANTIBODY (ROUTINE TESTING W REFLEX): HIV 1&2 Ab, 4th Generation: NONREACTIVE

## 2021-10-30 MED ORDER — NA SULFATE-K SULFATE-MG SULF 17.5-3.13-1.6 GM/177ML PO SOLN: 1.0000 | ORAL | 0 refills | Status: DC

## 2021-10-30 NOTE — Progress Notes (Signed)

## 2021-11-06 ENCOUNTER — Encounter: Payer: Self-pay | Admitting: Family Medicine

## 2021-11-06 ENCOUNTER — Other Ambulatory Visit: Payer: Self-pay

## 2021-11-06 ENCOUNTER — Telehealth: Payer: Self-pay | Admitting: Family Medicine

## 2021-11-06 ENCOUNTER — Ambulatory Visit (INDEPENDENT_AMBULATORY_CARE_PROVIDER_SITE_OTHER): Payer: 59 | Admitting: Family Medicine

## 2021-11-06 VITALS — BP 132/74 | HR 73 | Ht 68.0 in | Wt 230.6 lb

## 2021-11-06 DIAGNOSIS — E11319 Type 2 diabetes mellitus with unspecified diabetic retinopathy without macular edema: Secondary | ICD-10-CM

## 2021-11-06 DIAGNOSIS — Z789 Other specified health status: Secondary | ICD-10-CM

## 2021-11-06 DIAGNOSIS — E1169 Type 2 diabetes mellitus with other specified complication: Secondary | ICD-10-CM

## 2021-11-06 DIAGNOSIS — I10 Essential (primary) hypertension: Secondary | ICD-10-CM | POA: Diagnosis not present

## 2021-11-06 DIAGNOSIS — Z125 Encounter for screening for malignant neoplasm of prostate: Secondary | ICD-10-CM

## 2021-11-06 DIAGNOSIS — E113592 Type 2 diabetes mellitus with proliferative diabetic retinopathy without macular edema, left eye: Secondary | ICD-10-CM

## 2021-11-06 DIAGNOSIS — R2230 Localized swelling, mass and lump, unspecified upper limb: Secondary | ICD-10-CM | POA: Insufficient documentation

## 2021-11-06 DIAGNOSIS — R2231 Localized swelling, mass and lump, right upper limb: Secondary | ICD-10-CM

## 2021-11-06 DIAGNOSIS — E039 Hypothyroidism, unspecified: Secondary | ICD-10-CM

## 2021-11-06 DIAGNOSIS — Z794 Long term (current) use of insulin: Secondary | ICD-10-CM | POA: Diagnosis not present

## 2021-11-06 DIAGNOSIS — R7989 Other specified abnormal findings of blood chemistry: Secondary | ICD-10-CM

## 2021-11-06 DIAGNOSIS — E785 Hyperlipidemia, unspecified: Secondary | ICD-10-CM

## 2021-11-06 DIAGNOSIS — Z6835 Body mass index (BMI) 35.0-35.9, adult: Secondary | ICD-10-CM

## 2021-11-06 DIAGNOSIS — R2232 Localized swelling, mass and lump, left upper limb: Secondary | ICD-10-CM

## 2021-11-06 DIAGNOSIS — Z79899 Other long term (current) drug therapy: Secondary | ICD-10-CM

## 2021-11-06 DIAGNOSIS — Z0000 Encounter for general adult medical examination without abnormal findings: Secondary | ICD-10-CM | POA: Diagnosis not present

## 2021-11-06 LAB — POCT GLYCOSYLATED HEMOGLOBIN (HGB A1C): Hemoglobin A1C: 8.3 % — AB (ref 4.0–5.6)

## 2021-11-06 MED ORDER — CYCLOBENZAPRINE HCL 10 MG PO TABS: 10.0000 mg | ORAL_TABLET | Freq: Three times a day (TID) | ORAL | 3 refills | Status: AC | PRN

## 2021-11-06 NOTE — Progress Notes (Signed)
? ?Subjective:  ? ? Patient ID: Mitchell Palmer, male    DOB: 1965-09-20, 56 y.o.   MRN: [id number] ? ?This visit occurred during the SARS-CoV-2 public health emergency.  Safety protocols were in place, including screening questions prior to the visit, additional usage of staff PPE, and extensive cleaning of exam room while observing appropriate contact time as indicated for disinfecting solutions.  ? ?HPI ?Here for health maintenance exam and to review chronic medical problems   ? ?Wt Readings from Last 3 Encounters:  ?11/06/21 230 lb 9.6 oz (104.6 kg)  ?10/30/21 225 lb (102.1 kg)  ?05/31/21 231 lb (104.8 kg)  ? ?35.06 kg/m? ? ? ?Zoster status: interested in shingrix  ?Covid immunized  ?Td 08/2020 ?Flu shot 05/2021  ? ?Colonoscopy 02/2016 with 5 y recall/ upcoming with endoscopy  ?Fam h/o colon cancer  ?Taking ppi ? ?Prostate cancer screen ?Lab Results  ?Component Value Date  ? PSA 0.66 10/27/2021  ? PSA 0.60 09/02/2020  ? ?HIV screen negative  ?Nocturia noted from drinking water  ? ?Occ does not empty all the way but not often ?No family history  ? ? ?HTN ?bp is stable today  ?No cp or palpitations or headaches or edema  ?No side effects to medicines  ?BP Readings from Last 3 Encounters:  ?11/06/21 132/74  ?05/31/21 136/78  ?04/12/21 138/86  ?   ? ?Benicar 40 mg daily  ? ?Lab Results  ?Component Value Date  ? CREATININE 1.12 10/27/2021  ? BUN 14 10/27/2021  ? NA 140 10/27/2021  ? K 5.0 10/27/2021  ? CL 101 10/27/2021  ? CO2 29 10/27/2021  ? ? ? ?GERD ?Omeprazole 40 mg daily  ?Lab Results  ?Component Value Date  ? [id number] 274 10/27/2021  ? ? ?Low D with level of 29.8 ? ? ?DM2 ?Under care of endocrinology  ?On lantus and trulicity and metformin (pm dose does not agree with him)  ?Has retinopathy  ? ?Lab Results  ?Component Value Date  ? HGBA1C 8.3 (A) 11/06/2021  ? ? ? ?Diabetes has been so/so  ?Needs to cut out sweets and this is very hard for him  ?Tries to avoid ice cream (eats it if sugar if low)  ? ?Alcohol  intake :   3 drinks if he is off work  (not every day)  ? ?Hypothyroidism  ?Pt has no clinical changes ?No change in energy level/ hair or skin/ edema and no tremor ?Lab Results  ?Component Value Date  ? TSH 4.19 10/27/2021  ?Levothyroxine 75 mcg once daily ? ?Hyperlipidemia  ?Lab Results  ?Component Value Date  ? CHOL 133 10/27/2021  ? CHOL 140 05/24/2021  ? CHOL 146 09/02/2020  ? ?Lab Results  ?Component Value Date  ? HDL 45.50 10/27/2021  ? HDL 43.90 05/24/2021  ? HDL 39.40 09/02/2020  ? ?Lab Results  ?Component Value Date  ? [place] 37 10/20/2019  ? [place] 51 09/18/2018  ? LDLCALC 36 10/31/2017  ? ?Lab Results  ?Component Value Date  ? TRIG 210.0 (H) 10/27/2021  ? TRIG 225.0 (H) 05/24/2021  ? TRIG 325.0 (H) 09/02/2020  ? ?Lab Results  ?Component Value Date  ? CHOLHDL 3 10/27/2021  ? CHOLHDL 3 05/24/2021  ? CHOLHDL 4 09/02/2020  ? ?Lab Results  ?Component Value Date  ? LDLDIRECT 63.0 10/27/2021  ? LDLDIRECT 67.0 05/24/2021  ? LDLDIRECT 65.0 09/02/2020  ? ?Crestor 10 mg daily  ?Eating a bit better  ? ?Not as much exercise after the shoulder surgery  ?  Needs refill of flexeril  ? ?Still knot on forearm  ?Knots on fingers  ? ?Patient Active Problem List  ? Diagnosis Date Noted  ? Pseudophakia, left eye 10/26/2021  ? Pseudophakia, right eye 10/26/2021  ? Vitreomacular adhesion of left eye 09/21/2021  ? 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/2021  ? Vitreous hemorrhage, left eye ([place]) 06/07/2021  ? Proliferative diabetic retinopathy of left eye with macular edema associated with type 2 diabetes mellitus ([place]) 06/07/2021  ? Localized swelling, mass and lump, upper limb 05/31/2021  ? Low vitamin D level 09/14/2020  ? Prostate cancer screening 09/02/2020  ? Current use of proton pump inhibitor 09/02/2020  ? Family history of factor V Leiden mutation 05/30/2020  ? Left ankle pain 01/29/2020  ? Stable treated proliferative diabetic retinopathy of right eye determined by examination associated with type 2 diabetes mellitus ([place])  12/24/2019  ? Proliferative diabetic retinopathy of left eye determined by examination ([place]) 12/24/2019  ? Nuclear sclerotic cataract of left eye 12/24/2019  ? Low testosterone 11/13/2019  ? Fatigue 10/20/2019  ? Neck pain 04/28/2018  ? Need for hepatitis B screening test 03/26/2018  ? Need for hepatitis C screening test 03/26/2018  ? Paresthesia of both feet 11/01/2017  ? Hemorrhoids 10/14/2017  ? Class 2 obesity due to excess calories with body mass index (BMI) of 35.0 to 35.9 in adult 01/24/2016  ? Hypothyroidism 01/24/2016  ? Type 2 diabetes mellitus with ophthalmic complication ([place]) 38/25/0539  ? Alcohol consumption heavy 12/06/2014  ? Routine general medical examination at a health care facility 09/30/2013  ? Low back pain 06/17/2012  ? PLANTAR FASCIITIS, TRAUMATIC 11/09/2009  ? NEOPLASM UNCERTAIN BEHAVIOR OTHER SPEC SITES 10/07/2009  ? ESOPHAGEAL STRICTURE 06/27/2009  ? GERD 03/31/2009  ? Hyperlipidemia associated with type 2 diabetes mellitus ([place]) 01/06/2009  ? Essential hypertension 01/06/2009  ? Allergic rhinitis 01/06/2009  ? ?Past Medical History:  ?Diagnosis Date  ? Allergy   ? allegic rhinitis  ? Diabetes mellitus   ? type II  ? Esophageal stricture   ? GERD (gastroesophageal reflux disease)   ? History of hernia repair   ? as a baby  ? Hyperlipidemia   ? Hyperplastic polyp of intestine 2010  ? Hypertension   ? Proliferative diabetic retinopathy of left eye ([place]) 03/31/2020  ? This is active as a patient still has tufts of the anterior segment neovascularization, rubeosis in the left eye every region of capillary nonperfusion should be treated with PRP.  ? S/P ear surgery, follow-up exam   ? for drainage  ? Vitreous hemorrhage of right eye ([place]) 12/24/2019  ? ?Past Surgical History:  ?Procedure Laterality Date  ? CATARACT EXTRACTION    ? COLONOSCOPY  03/07/2016  ? ESOPHAGOGASTRODUODENOSCOPY  04/20/2009  ? stricture/GERD  ? EYE SURGERY    ? HERNIA REPAIR    ? age 44-midline  ? INNER EAR SURGERY   08/20/1986  ? went in behind rt ear-blockage  ? NECK SURGERY    ? [facility]  ? POLYPECTOMY    ? SHOULDER ARTHROSCOPY WITH ROTATOR CUFF REPAIR AND SUBACROMIAL DECOMPRESSION Right 09/29/2012  ? Procedure: SHOULDER ARTHROSCOPY WITH ROTATOR CUFF REPAIR AND SUBACROMIAL DECOMPRESSION;  Surgeon: Nita Sells, MD;  Location: [facility];  Service: Orthopedics;  Laterality: Right;  Right shoulder arthroscopy with subacromial decompression and distal clavicle excision & Debridement of Labrial Tear  ? SHOULDER SURGERY Left   ? x2  ? UPPER GASTROINTESTINAL ENDOSCOPY    ? ?Social History  ? ?  Tobacco Use  ? Smoking status: Never  ? Smokeless tobacco: Never  ?Vaping Use  ? Vaping Use: Never used  ?Substance Use Topics  ? Alcohol use: Yes  ?  Alcohol/week: 12.0 standard drinks  ?  Types: 12 Cans of beer per week  ? Drug use: No  ? ?Family History  ?Problem Relation Age of Onset  ? Diabetes Mother   ? Colon cancer Father 75  ? Diabetes Other   ? Colon polyps Brother   ? Diabetes Maternal Aunt   ? Cancer Cousin   ?     breast cancer  ? ?Allergies  ?Allergen Reactions  ? Atorvastatin   ?  Muscle pain   ? Codeine   ?  REACTION: anxious  ? ?Current Outpatient Medications on File Prior to Visit  ?Medication Sig Dispense Refill  ? B-D UF III MINI PEN NEEDLES 31G X 5 MM MISC USE AS DIRECTED 4 TIMES A DAY 400 each 3  ? Dulaglutide (TRULICITY) 4.5 WP/8.0DX SOPN Inject 4.5 mg as directed once a week. 2 mL 11  ? empagliflozin (JARDIANCE) 25 MG TABS tablet Take 1 tablet (25 mg total) by mouth daily before breakfast. 90 tablet 3  ? fluticasone (FLONASE) 50 MCG/ACT nasal spray SPRAY 2 SPRAYS INTO EACH NOSTRIL EVERY DAY 16 mL 0  ? insulin lispro (HUMALOG KWIKPEN) 100 UNIT/ML KwikPen INJECT 15-25 UNITS INTO THE SKIN 3 TIMES DAILY W/ MEALS AND 10-12 UNITS INTO THE SKIN W/ SNACKS 30 mL 1  ? LANTUS SOLOSTAR 100 UNIT/ML Solostar Pen INJECT 42 UNITS IN THE MORNING AND 50 UNITS IN THE EVENING 15 mL 0  ? levothyroxine  (SYNTHROID) 75 MCG tablet TAKE 1 TABLET BY MOUTH ONCE A DAY ON AN EMPTY STOMACH 30 MINUTES BEFORE EATING. 30 tablet 1  ? metFORMIN (GLUCOPHAGE) 1000 MG tablet TAKE 1 TABLET (1,000 MG TOTAL) BY MOUTH 2 (TWO) TIMES DAILY

## 2021-11-06 NOTE — Assessment & Plan Note (Signed)
Lab Results  ?Component Value Date  ? PSA 0.66 10/27/2021  ? PSA 0.60 09/02/2020  ? ? ?No family hx ?occ nocturia  ?

## 2021-11-06 NOTE — Assessment & Plan Note (Signed)
Vitamin B12 level noted in the low normal range ?Encourage patient to get back on his vitamin B12 ?

## 2021-11-06 NOTE — Assessment & Plan Note (Signed)
Level mildly low at 29.8 ?Encourage patient to get back on his vitamin D ?Stressed importance for bone health and overall  ?

## 2021-11-06 NOTE — Assessment & Plan Note (Signed)
Hypothyroidism  ?Pt has no clinical changes ?No change in energy level/ hair or skin/ edema and no tremor ?Lab Results  ?Component Value Date  ? TSH 4.19 10/27/2021  ?Plan to continue levothyroxine 75 mcg once ?

## 2021-11-06 NOTE — Assessment & Plan Note (Signed)
No changes ?Now area on L 3rd finger  ?Consider hand specialist eval ?

## 2021-11-06 NOTE — Assessment & Plan Note (Signed)
Reviewed health habits including diet and exercise and skin cancer prevention ?Reviewed appropriate screening tests for age  ?Also reviewed health mt list, fam hx and immunization status , as well as social and family history   ?See HPI ?Labs reviewed ?Patient is interested in the Shingrix vaccine and plans to call to check on coverage ?Colonoscopy is upcoming for 5-year recall along with endoscopy ?PSA is stable ?Family history reviewed ?

## 2021-11-06 NOTE — Assessment & Plan Note (Signed)
Recent eye exam noted ?Needs to follow-up with endocrinologist for diabetes care ?

## 2021-11-06 NOTE — Telephone Encounter (Signed)
-----   Message from Tammi Sou, [place] sent at 11/06/2021  4:53 PM EDT ----- ?Pt agreed to referral to hand specialist, he said if there is a hand specialist at [facility] he would like to go there if not anywhere in [place] PCP refers him to is fine  ?----- Message ----- ?From: Abner Greenspan, MD ?Sent: 11/06/2021   1:44 PM EDT ?To: Tammi Sou, CMA ? ?Please let pt know I would like to send him to a hand specialist to look at the lump on his arm and finger , let me know if agreeable  ? ?

## 2021-11-06 NOTE — Assessment & Plan Note (Signed)
bp in fair control at this time  ?BP Readings from Last 1 Encounters:  ?11/06/21 132/74  ? ?No changes needed ?Most recent labs reviewed  ?Disc lifstyle change with low sodium diet and exercise  ?Plan to continue benicar 40 mg daily  ?

## 2021-11-06 NOTE — Assessment & Plan Note (Signed)
Disc goals for lipids and reasons to control them ?Rev last labs with pt ?Rev low sat fat diet in detail ?LDL is down to 37 ?Plan to continue crestor 10 mg daily and better diet  ?

## 2021-11-06 NOTE — Patient Instructions (Addendum)
If you are interested in the shingles vaccine series (Shingrix), call your insurance or pharmacy to check on coverage and location it must be given.  If affordable - you can schedule it here or at your pharmacy depending on coverage  ? ?Your vitamin B12 level is low normal  ?Get back on the vitamin B12  ? ?A1c is 8.3 ?Please schedule an appt with Dr Cruzita Lederer for your diabetes  ? ? ?I will do some research on the knots you are having  ? ?Get your colonoscopy as planned  ? ? ? ? ? ?

## 2021-11-06 NOTE — Telephone Encounter (Signed)
Referral done  ? ?Have him call us in a week if he does not hear back  ? ?

## 2021-11-06 NOTE — Assessment & Plan Note (Signed)
Lab Results  ?Component Value Date  ? HGBA1C 8.3 (A) 11/06/2021  ? ?He takes insulin ?Needs follow-up with his endocrinologist as soon as possible for diabetes care ?Diet is fair ?

## 2021-11-06 NOTE — Assessment & Plan Note (Signed)
Patient drinks 2-3 drinks on days off, this is not every day ?Commended on cutting down ?Counseled on this ?

## 2021-11-06 NOTE — Assessment & Plan Note (Signed)
Discussed how this problem influences overall health and the risks it imposes  Reviewed plan for weight loss with lower calorie diet (via better food choices and also portion control or program like weight watchers) and exercise building up to or more than 30 minutes 5 days per week including some aerobic activity    

## 2021-11-07 ENCOUNTER — Other Ambulatory Visit: Payer: Self-pay | Admitting: Internal Medicine

## 2021-11-07 NOTE — Telephone Encounter (Signed)
Called an left detailed voicemail informing pt that  his referral was sent to [facility].  Advised pt if he had not heard anything from  them in a week to call us back.  ?

## 2021-11-13 ENCOUNTER — Encounter: Payer: 59 | Admitting: Gastroenterology

## 2021-11-14 ENCOUNTER — Ambulatory Visit (INDEPENDENT_AMBULATORY_CARE_PROVIDER_SITE_OTHER): Payer: 59 | Admitting: Internal Medicine

## 2021-11-14 ENCOUNTER — Other Ambulatory Visit: Payer: Self-pay

## 2021-11-14 ENCOUNTER — Encounter: Payer: Self-pay | Admitting: Internal Medicine

## 2021-11-14 VITALS — BP 140/82 | HR 66 | Ht 68.0 in | Wt 230.0 lb

## 2021-11-14 DIAGNOSIS — E039 Hypothyroidism, unspecified: Secondary | ICD-10-CM

## 2021-11-14 DIAGNOSIS — E7800 Pure hypercholesterolemia, unspecified: Secondary | ICD-10-CM | POA: Diagnosis not present

## 2021-11-14 DIAGNOSIS — Z794 Long term (current) use of insulin: Secondary | ICD-10-CM

## 2021-11-14 DIAGNOSIS — E11319 Type 2 diabetes mellitus with unspecified diabetic retinopathy without macular edema: Secondary | ICD-10-CM

## 2021-11-14 MED ORDER — METFORMIN HCL 1000 MG PO TABS: 1000.0000 mg | ORAL_TABLET | Freq: Two times a day (BID) | ORAL | 11 refills | Status: DC

## 2021-11-14 NOTE — Progress Notes (Signed)
Subjective:  ?  ? Patient ID: Mitchell Palmer, male   DOB: Dec 05, 1965, 56 y.o.   MRN: [id number]  ? ?This visit occurred during the SARS-CoV-2 public health emergency.  Safety protocols were in place, including screening questions prior to the visit, additional usage of staff PPE, and extensive cleaning of exam room while observing appropriate contact time as indicated for disinfecting solutions.   ? ?HPI Mr. Comes is a 56 y.o. man, returning for followup for DM2, dx in 2000, uncontrolled, insulin-dependent, with complications (CKD stage 3, erectile dysfunction, mild DR). Last visit 9 months ago. ? ?Interim history: ?He was prev. shoulder and knee steroid injections >> had shoulder sx 07/25/2021. He had another inj. Last month. ?He had cataract sx. Since last OV. He gets IO injections. ?No increased urination, blurry vision, nausea, chest pain. ? ?Reviewed HbA1c levels: ?Lab Results  ?Component Value Date  ? HGBA1C 8.3 (A) 11/06/2021  ? HGBA1C 8.1 (A) 02/22/2021  ? HGBA1C 8.0 (A) 10/17/2020  ? HGBA1C 7.8 (A) 05/11/2020  ? HGBA1C 7.5 (A) 01/07/2020  ? HGBA1C 7.5 (A) 09/08/2019  ? HGBA1C 7.1 (A) 04/16/2019  ? HGBA1C 7.6 (A) 09/03/2018  ? HGBA1C 8.2 (A) 05/21/2018  ? HGBA1C 7.4 (A) 01/27/2018  ? HGBA1C 7.6 10/21/2017  ? HGBA1C 8.0 05/10/2017  ? HGBA1C 7.4 11/15/2016  ? HGBA1C 7.6 08/15/2016  ? HGBA1C 7.7 05/15/2016  ? HGBA1C 7.8 02/15/2016  ? HGBA1C 7.7 11/08/2015  ? HGBA1C 8.0 (H) 07/22/2015  ? HGBA1C 8.9 (H) 12/06/2014  ? HGBA1C 9.1 (H) 03/01/2014  ? ?He is on: ?- Metformin 1000 mg 2x a day with meals ?- Jardiance 10 mg before breakfast - added 08/2018 >> 25 mg daily ?- Trulicity 1.5 >> 3 >> 4.5 mg weekly  ?- Lantus 35 >> 42 units 2x a day >> 42 units at waking up and 50 units at bedtime ?- Humalog 20-25 units before each meal, but 25 units with b'fast (40 when on steroids) ? ?He was on: ?- he stopped  Invokana 08/2016 as he had leg tingling ?- Cycloset - started 04/22/2013 >> at 1.6 mg daily (tried 3 tabs >>  dizziness >> backed off to 2) ?- Victoza 1.8 mg daily ?- Actos 45 mg daily ?- Januvia ?- glyburide/metformin ?- Avandia. ?- Bydureon 2 mg weekly - he hated the thick needle  ? ?He is checking his CBG 2-3 times a day: ?- am:  115-130 >> 125-145 >> 135-175 >> waking up: 125-165 >> 130-140 ?- 2 h after b'fast:  79-85 >> 181-215 >> n/c ?- before lunch: 111-140 >> 140-160 >> n/c >> 125-145 >> 155-200 >> 180-240 ?- 2 h after lunch:  130-145 >> 171-184 >> 230 >> n/c ?- Before dinner: 135-150 >> 145-165 >> 150-180 >> 77, 150-200s >> 120-140 ?- nighttime: 190 >> 110-125 >> 145-180 >> 145-180 >> bedtime: n/c ?Lowest: 78 >> 80s >> 77 >> 71;  He has hypoglycemia awareness in the 80s. ?Highest 469 (steroids) >> 469 >> 329 (big mac and fries) >> 340 (sweets), 400 (steroids). ? ?He works nights: 6 PM to 7:30 AM 3-4 days a week. ? ?Meals: ?- 5:30-6 pm (at work): subway sandwich >> 2 hot dogs + doritos >> chicken biscuit ?- snack at 9-10 pm: pistachios or 1/2 sandwich; pizza  >> 12 am: nabs +peanuts, apples ?- 11 am-12 pm: salad, meat + veggies + rice (leftovers from take out), pizza rarely ? ?+ HL. Last lipid panel: ?Lab Results  ?Component Value Date  ? CHOL 133  10/27/2021  ? HDL 45.50 10/27/2021  ? LDLCALC 37 10/20/2019  ? LDLDIRECT 63.0 10/27/2021  ? TRIG 210.0 (H) 10/27/2021  ? CHOLHDL 3 10/27/2021  ?On Lipitor 40. ? ?-+ Stage II CKD, last BUN/Cr:  ?Lab Results  ?Component Value Date  ? BUN 14 10/27/2021  ? CREATININE 1.12 10/27/2021  ? ?Lab Results  ?Component Value Date  ? MICRALBCREAT 0.9 02/15/2016  ? MICRALBCREAT 1.0 12/07/2014  ? MICRALBCREAT 0.5 11/04/2012  ?On olmesartan. ? ?- last eye exam was in 10/2021: + NPDR OS, without macular edema.  Dr. Groat >> now Dr. Rankin.  He had laser surgery OU.  Also, In 07/2018 >> had EMG surgery in his R eye for bleeding. He had cataract sx (Dr. Groat), another surgery (Dr. Rankin) on R eye in 2021. ? ?- no numbness or tingling in his legs.  Foot exam was done in 10/2021. ? ?He also  has a history of HTN, GERD, esophageal stricture, plantar fasciitis, eustachian tube dysfunction - status post ear surgery. ? ?Hypothyroidism: ?-Started levothyroxine 01/2016 ?-He was skipping doses in the past and TFTs were fluctuating. ? ?Pt is on levothyroxine 75 mcg daily, taken: ?- in am ?- fasting ?- at least 30 min from b'fast ?- no calcium ?- no iron ?- no multivitamins ?- no PPIs ?- not on Biotin ? ?Reviewed his TFTs: ?Lab Results  ?Component Value Date  ? TSH 4.19 10/27/2021  ? TSH 2.47 09/02/2020  ? TSH 1.85 10/20/2019  ? TSH 2.95 10/21/2018  ? TSH 5.18 (H) 09/18/2018  ? TSH 4.25 10/31/2017  ? TSH 1.17 09/21/2016  ? TSH 3.05 04/24/2016  ? TSH 4.97 (H) 03/06/2016  ? TSH 8.97 (H) 01/23/2016  ? ?In 07/2018, he had C spine surgery. ? ?Review of Systems ?+ see HPI ?+ joint aches ? ?I reviewed pt's medications, allergies, PMH, social hx, family hx, and changes were documented in the history of present illness. Otherwise, unchanged from my initial visit note. ? ?Past Medical History:  ?Diagnosis Date  ? Allergy   ? allegic rhinitis  ? Diabetes mellitus   ? type II  ? Esophageal stricture   ? GERD (gastroesophageal reflux disease)   ? History of hernia repair   ? as a baby  ? Hyperlipidemia   ? Hyperplastic polyp of intestine 2010  ? Hypertension   ? Proliferative diabetic retinopathy of left eye (HCC) 03/31/2020  ? This is active as a patient still has tufts of the anterior segment neovascularization, rubeosis in the left eye every region of capillary nonperfusion should be treated with PRP.  ? S/P ear surgery, follow-up exam   ? for drainage  ? Vitreous hemorrhage of right eye (HCC) 12/24/2019  ? ?Past Surgical History:  ?Procedure Laterality Date  ? CATARACT EXTRACTION    ? COLONOSCOPY  03/07/2016  ? ESOPHAGOGASTRODUODENOSCOPY  04/20/2009  ? stricture/GERD  ? EYE SURGERY    ? HERNIA REPAIR    ? age 2-midline  ? INNER EAR SURGERY  08/20/1986  ? went in behind rt ear-blockage  ? NECK SURGERY    ? [facility]  ?  POLYPECTOMY    ? SHOULDER ARTHROSCOPY WITH ROTATOR CUFF REPAIR AND SUBACROMIAL DECOMPRESSION Right 09/29/2012  ? Procedure: SHOULDER ARTHROSCOPY WITH ROTATOR CUFF REPAIR AND SUBACROMIAL DECOMPRESSION;  Surgeon: Justin William Chandler, MD;  Location: [facility];  Service: Orthopedics;  Laterality: Right;  Right shoulder arthroscopy with subacromial decompression and distal clavicle excision & Debridement of Labrial Tear  ? SHOULDER SURGERY   Left   ? x2  ? UPPER GASTROINTESTINAL ENDOSCOPY    ? ?Social History  ? ?Socioeconomic History  ? Marital status: Single  ?  Spouse name: Not on file  ? Number of children: Not on file  ? Years of education: Not on file  ? Highest education level: Not on file  ?Occupational History  ? Occupation: Mining engineer  ?Tobacco Use  ? Smoking status: Never  ? Smokeless tobacco: Never  ?Vaping Use  ? Vaping Use: Never used  ?Substance and Sexual Activity  ? Alcohol use: Yes  ?  Alcohol/week: 12.0 standard drinks  ?  Types: 12 Cans of beer per week  ? Drug use: No  ? Sexual activity: Not on file  ?Other Topics Concern  ? Not on file  ?Social History Narrative  ? Not on file  ? ?Social Determinants of Health  ? ?Financial Resource Strain: Not on file  ?Food Insecurity: Not on file  ?Transportation Needs: Not on file  ?Physical Activity: Not on file  ?Stress: Not on file  ?Social Connections: Not on file  ?Intimate Partner Violence: Not on file  ? ?Current Outpatient Medications on File Prior to Visit  ?Medication Sig Dispense Refill  ? B-D UF III MINI PEN NEEDLES 31G X 5 MM MISC USE AS DIRECTED 4 TIMES A DAY 400 each 3  ? cyclobenzaprine (FLEXERIL) 10 MG tablet Take 1 tablet (10 mg total) by mouth 3 (three) times daily as needed. 30 tablet 3  ? Dulaglutide (TRULICITY) 4.5 ZO/1.0RU SOPN Inject 4.5 mg as directed once a week. 2 mL 11  ? empagliflozin (JARDIANCE) 25 MG TABS tablet Take 1 tablet (25 mg total) by mouth daily before breakfast. 90 tablet 3  ? fluticasone (FLONASE) 50  MCG/ACT nasal spray SPRAY 2 SPRAYS INTO EACH NOSTRIL EVERY DAY 16 mL 0  ? insulin lispro (HUMALOG KWIKPEN) 100 UNIT/ML KwikPen INJECT 15-25 UNITS INTO THE SKIN 3 TIMES DAILY W/ MEALS AND 10-12 UNITS INTO THE SKI

## 2021-11-14 NOTE — Patient Instructions (Addendum)
Please continue: ?- Metformin 1000 mg 2x a day with meals ?- Jardiance 25 mg before breakfast ?- Lantus 42 units at waking up and 50 units at bedtime ?- Trulicity 4.5 mg weekly ? ?Please use: ?- Humalog 20-25 units before each meal (use 30-35 units for breakfast) ? ?STOP DRIED FRUIT! ? ?Please return in 3-4 months with your sugar log.  ?

## 2021-11-14 NOTE — H&P (View-Only) (Signed)
Subjective:  ?  ? Patient ID: Mitchell Palmer, male   DOB: Mar 15, 1966, 56 y.o.   MRN: [id number]  ? ?This visit occurred during the SARS-CoV-2 public health emergency.  Safety protocols were in place, including screening questions prior to the visit, additional usage of staff PPE, and extensive cleaning of exam room while observing appropriate contact time as indicated for disinfecting solutions.   ? ?HPI Mitchell Palmer is a 56 y.o. man, returning for followup for DM2, dx in 2000, uncontrolled, insulin-dependent, with complications (CKD stage 3, erectile dysfunction, mild DR). Last visit 9 months ago. ? ?Interim history: ?He was prev. shoulder and knee steroid injections >> had shoulder sx 07/25/2021. He had another inj. Last month. ?He had cataract sx. Since last OV. He gets IO injections. ?No increased urination, blurry vision, nausea, chest pain. ? ?Reviewed HbA1c levels: ?Lab Results  ?Component Value Date  ? HGBA1C 8.3 (A) 11/06/2021  ? HGBA1C 8.1 (A) 02/22/2021  ? HGBA1C 8.0 (A) 10/17/2020  ? HGBA1C 7.8 (A) 05/11/2020  ? HGBA1C 7.5 (A) 01/07/2020  ? HGBA1C 7.5 (A) 09/08/2019  ? HGBA1C 7.1 (A) 04/16/2019  ? HGBA1C 7.6 (A) 09/03/2018  ? HGBA1C 8.2 (A) 05/21/2018  ? HGBA1C 7.4 (A) 01/27/2018  ? HGBA1C 7.6 10/21/2017  ? HGBA1C 8.0 05/10/2017  ? HGBA1C 7.4 11/15/2016  ? HGBA1C 7.6 08/15/2016  ? HGBA1C 7.7 05/15/2016  ? HGBA1C 7.8 02/15/2016  ? HGBA1C 7.7 11/08/2015  ? HGBA1C 8.0 (H) 07/22/2015  ? HGBA1C 8.9 (H) 12/06/2014  ? HGBA1C 9.1 (H) 03/01/2014  ? ?He is on: ?- Metformin 1000 mg 2x a day with meals ?- Jardiance 10 mg before breakfast - added 08/2018 >> 25 mg daily ?- Trulicity 1.5 >> 3 >> 4.5 mg weekly  ?- Lantus 35 >> 42 units 2x a day >> 42 units at waking up and 50 units at bedtime ?- Humalog 20-25 units before each meal, but 25 units with b'fast (40 when on steroids) ? ?He was on: ?- he stopped  Invokana 08/2016 as he had leg tingling ?- Cycloset - started 04/22/2013 >> at 1.6 mg daily (tried 3 tabs >>  dizziness >> backed off to 2) ?- Victoza 1.8 mg daily ?- Actos 45 mg daily ?- Januvia ?- glyburide/metformin ?- Avandia. ?- Bydureon 2 mg weekly - he hated the thick needle  ? ?He is checking his CBG 2-3 times a day: ?- am:  115-130 >> 125-145 >> 135-175 >> waking up: 125-165 >> 130-140 ?- 2 h after b'fast:  79-85 >> 181-215 >> n/c ?- before lunch: 111-140 >> 140-160 >> n/c >> 125-145 >> 155-200 >> 180-240 ?- 2 h after lunch:  130-145 >> 171-184 >> 230 >> n/c ?- Before dinner: 135-150 >> 145-165 >> 150-180 >> 77, 150-200s >> 120-140 ?- nighttime: 190 >> 110-125 >> 145-180 >> 145-180 >> bedtime: n/c ?Lowest: 78 >> 80s >> 77 >> 71;  He has hypoglycemia awareness in the 80s. ?Highest 469 (steroids) >> 469 >> 329 (big mac and fries) >> 340 (sweets), 400 (steroids). ? ?He works nights: 6 PM to 7:30 AM 3-4 days a week. ? ?Meals: ?- 5:30-6 pm (at work): subway sandwich >> 2 hot dogs + doritos >> chicken biscuit ?- snack at 9-10 pm: pistachios or 1/2 sandwich; pizza  >> 12 am: nabs +peanuts, apples ?- 11 am-12 pm: salad, meat + veggies + rice (leftovers from take out), pizza rarely ? ?+ HL. Last lipid panel: ?Lab Results  ?Component Value Date  ? CHOL 133  10/27/2021  ? HDL 45.50 10/27/2021  ? [place] 37 10/20/2019  ? LDLDIRECT 63.0 10/27/2021  ? TRIG 210.0 (H) 10/27/2021  ? CHOLHDL 3 10/27/2021  ?On Lipitor 40. ? ?-+ Stage II CKD, last BUN/Cr:  ?Lab Results  ?Component Value Date  ? BUN 14 10/27/2021  ? CREATININE 1.12 10/27/2021  ? ?Lab Results  ?Component Value Date  ? MICRALBCREAT 0.9 02/15/2016  ? MICRALBCREAT 1.0 12/07/2014  ? MICRALBCREAT 0.5 11/04/2012  ?On olmesartan. ? ?- last eye exam was in 10/2021: + NPDR OS, without macular edema.  Dr. Katy Fitch >> now Dr. Zadie Rhine.  He had laser surgery OU.  Also, In 07/2018 >> had EMG surgery in his R eye for bleeding. He had cataract sx (Dr. Katy Fitch), another surgery (Dr. Zadie Rhine) on R eye in 2021. ? ?- no numbness or tingling in his legs.  Foot exam was done in 10/2021. ? ?He also  has a history of HTN, GERD, esophageal stricture, plantar fasciitis, eustachian tube dysfunction - status post ear surgery. ? ?Hypothyroidism: ?-Started levothyroxine 01/2016 ?-He was skipping doses in the past and TFTs were fluctuating. ? ?Pt is on levothyroxine 75 mcg daily, taken: ?- in am ?- fasting ?- at least 30 min from b'fast ?- no calcium ?- no iron ?- no multivitamins ?- no PPIs ?- not on Biotin ? ?Reviewed his TFTs: ?Lab Results  ?Component Value Date  ? TSH 4.19 10/27/2021  ? TSH 2.47 09/02/2020  ? TSH 1.85 10/20/2019  ? TSH 2.95 10/21/2018  ? TSH 5.18 (H) 09/18/2018  ? TSH 4.25 10/31/2017  ? TSH 1.17 09/21/2016  ? TSH 3.05 04/24/2016  ? TSH 4.97 (H) 03/06/2016  ? TSH 8.97 (H) 01/23/2016  ? ?In 07/2018, he had C spine surgery. ? ?Review of Systems ?+ see HPI ?+ joint aches ? ?I reviewed pt's medications, allergies, PMH, social hx, family hx, and changes were documented in the history of present illness. Otherwise, unchanged from my initial visit note. ? ?Past Medical History:  ?Diagnosis Date  ? Allergy   ? allegic rhinitis  ? Diabetes mellitus   ? type II  ? Esophageal stricture   ? GERD (gastroesophageal reflux disease)   ? History of hernia repair   ? as a baby  ? Hyperlipidemia   ? Hyperplastic polyp of intestine 2010  ? Hypertension   ? Proliferative diabetic retinopathy of left eye ([place]) 03/31/2020  ? This is active as a patient still has tufts of the anterior segment neovascularization, rubeosis in the left eye every region of capillary nonperfusion should be treated with PRP.  ? S/P ear surgery, follow-up exam   ? for drainage  ? Vitreous hemorrhage of right eye ([place]) 12/24/2019  ? ?Past Surgical History:  ?Procedure Laterality Date  ? CATARACT EXTRACTION    ? COLONOSCOPY  03/07/2016  ? ESOPHAGOGASTRODUODENOSCOPY  04/20/2009  ? stricture/GERD  ? EYE SURGERY    ? HERNIA REPAIR    ? age 44-midline  ? INNER EAR SURGERY  08/20/1986  ? went in behind rt ear-blockage  ? NECK SURGERY    ? [facility]  ?  POLYPECTOMY    ? SHOULDER ARTHROSCOPY WITH ROTATOR CUFF REPAIR AND SUBACROMIAL DECOMPRESSION Right 09/29/2012  ? Procedure: SHOULDER ARTHROSCOPY WITH ROTATOR CUFF REPAIR AND SUBACROMIAL DECOMPRESSION;  Surgeon: Nita Sells, MD;  Location: [facility];  Service: Orthopedics;  Laterality: Right;  Right shoulder arthroscopy with subacromial decompression and distal clavicle excision & Debridement of Labrial Tear  ? SHOULDER SURGERY  Left   ? x2  ? UPPER GASTROINTESTINAL ENDOSCOPY    ? ?Social History  ? ?Socioeconomic History  ? Marital status: Single  ?  Spouse name: Not on file  ? Number of children: Not on file  ? Years of education: Not on file  ? Highest education level: Not on file  ?Occupational History  ? Occupation: Mining engineer  ?Tobacco Use  ? Smoking status: Never  ? Smokeless tobacco: Never  ?Vaping Use  ? Vaping Use: Never used  ?Substance and Sexual Activity  ? Alcohol use: Yes  ?  Alcohol/week: 12.0 standard drinks  ?  Types: 12 Cans of beer per week  ? Drug use: No  ? Sexual activity: Not on file  ?Other Topics Concern  ? Not on file  ?Social History Narrative  ? Not on file  ? ?Social Determinants of Health  ? ?Financial Resource Strain: Not on file  ?Food Insecurity: Not on file  ?Transportation Needs: Not on file  ?Physical Activity: Not on file  ?Stress: Not on file  ?Social Connections: Not on file  ?Intimate Partner Violence: Not on file  ? ?Current Outpatient Medications on File Prior to Visit  ?Medication Sig Dispense Refill  ? B-D UF III MINI PEN NEEDLES 31G X 5 MM MISC USE AS DIRECTED 4 TIMES A DAY 400 each 3  ? cyclobenzaprine (FLEXERIL) 10 MG tablet Take 1 tablet (10 mg total) by mouth 3 (three) times daily as needed. 30 tablet 3  ? Dulaglutide (TRULICITY) 4.5 IP/3.8SN SOPN Inject 4.5 mg as directed once a week. 2 mL 11  ? empagliflozin (JARDIANCE) 25 MG TABS tablet Take 1 tablet (25 mg total) by mouth daily before breakfast. 90 tablet 3  ? fluticasone (FLONASE) 50  MCG/ACT nasal spray SPRAY 2 SPRAYS INTO EACH NOSTRIL EVERY DAY 16 mL 0  ? insulin lispro (HUMALOG KWIKPEN) 100 UNIT/ML KwikPen INJECT 15-25 UNITS INTO THE SKIN 3 TIMES DAILY W/ MEALS AND 10-12 UNITS INTO THE SKI

## 2021-11-21 ENCOUNTER — Encounter: Payer: Self-pay | Admitting: Internal Medicine

## 2021-11-21 ENCOUNTER — Other Ambulatory Visit: Payer: Self-pay | Admitting: Family Medicine

## 2021-11-21 ENCOUNTER — Other Ambulatory Visit: Payer: Self-pay | Admitting: Internal Medicine

## 2021-11-21 DIAGNOSIS — E11319 Type 2 diabetes mellitus with unspecified diabetic retinopathy without macular edema: Secondary | ICD-10-CM

## 2021-11-21 MED ORDER — FREESTYLE LIBRE 2 SENSOR MISC: 3 refills | Status: DC

## 2021-11-22 ENCOUNTER — Other Ambulatory Visit: Payer: Self-pay | Admitting: Family Medicine

## 2021-11-22 MED ORDER — FLUTICASONE PROPIONATE 50 MCG/ACT NA SUSP: 2.0000 | Freq: Every day | NASAL | 3 refills | Status: DC | PRN

## 2021-11-22 NOTE — Telephone Encounter (Signed)
Inbound call from insurance to advise pt called and requested the [facility] and a PA form is being sent over to be completed. ?

## 2021-11-22 NOTE — Addendum Note (Signed)
Addended by: Tammi Sou on: 11/22/2021 04:16 PM ? ? Modules accepted: Orders ? ?

## 2021-11-23 ENCOUNTER — Telehealth: Payer: Self-pay | Admitting: Pharmacy Technician

## 2021-11-23 ENCOUNTER — Other Ambulatory Visit (HOSPITAL_COMMUNITY): Payer: Self-pay

## 2021-11-23 NOTE — Telephone Encounter (Signed)
Patient Advocate Encounter ? ?Received notification from [place] that prior authorization for FREESTYLE LIBRE 3 SENSOR is required. ?  ?PA submitted on 4.6.23 ?Key [id number] ?Status is pending ?  ? Clinic will continue to follow ? ?Denaja Verhoeven R Miasia Crabtree, CPhT ?Patient Advocate ?[facility] ?Phone: [phone] ?Fax:  [phone] ? ?

## 2021-11-23 NOTE — Telephone Encounter (Signed)
Patient Advocate Encounter ? ?Received notification from [place] that prior authorization for [facility] '10MG'$  is required. ?  ?PA submitted on 4.6.23 ?Key [id number] ?Status is pending ?  ?[facility] will continue to follow ? ?Jesseca Marsch R Kyri Shader, CPhT ?Patient Advocate ?[facility] ?Phone: [phone] ?Fax:  [phone] ? ?

## 2021-11-24 ENCOUNTER — Other Ambulatory Visit: Payer: Self-pay | Admitting: Orthopedic Surgery

## 2021-11-24 DIAGNOSIS — M79631 Pain in right forearm: Secondary | ICD-10-CM

## 2021-11-29 ENCOUNTER — Ambulatory Visit
Admission: RE | Admit: 2021-11-29 | Discharge: 2021-11-29 | Disposition: A | Discharge status: Home / Self Care | Payer: 59 | Source: Ambulatory Visit | Attending: Orthopedic Surgery | Admitting: Orthopedic Surgery

## 2021-11-29 ENCOUNTER — Other Ambulatory Visit (HOSPITAL_COMMUNITY): Payer: Self-pay

## 2021-11-29 DIAGNOSIS — M79631 Pain in right forearm: Secondary | ICD-10-CM

## 2021-11-29 MED ORDER — GADOBENATE DIMEGLUMINE 529 MG/ML IV SOLN
20.0000 mL | Freq: Once | INTRAVENOUS | Status: AC | PRN
  Administered 2021-11-29: 20 mL via INTRAVENOUS

## 2021-11-30 ENCOUNTER — Encounter (HOSPITAL_COMMUNITY): Payer: Self-pay | Admitting: Gastroenterology

## 2021-12-01 ENCOUNTER — Telehealth: Payer: Self-pay

## 2021-12-01 DIAGNOSIS — E11319 Type 2 diabetes mellitus with unspecified diabetic retinopathy without macular edema: Secondary | ICD-10-CM

## 2021-12-01 NOTE — Telephone Encounter (Signed)
Pt called to request rx for [facility] be sent to preferred pharmacy.  ?

## 2021-12-01 NOTE — Telephone Encounter (Signed)
Ok to try the 10 mg for 4 pens (2 mL) with 3 refills  - just in case he gets nausea and we need to decrease the dose. ?

## 2021-12-04 ENCOUNTER — Other Ambulatory Visit: Payer: Self-pay

## 2021-12-04 DIAGNOSIS — E11319 Type 2 diabetes mellitus with unspecified diabetic retinopathy without macular edema: Secondary | ICD-10-CM

## 2021-12-04 MED ORDER — MOUNJARO 10 MG/0.5ML ~~LOC~~ SOAJ: 10.0000 mg | SUBCUTANEOUS | 3 refills | Status: DC

## 2021-12-04 MED ORDER — FREESTYLE LIBRE 3 SENSOR MISC: 2.0000 | 3 refills | Status: DC

## 2021-12-04 NOTE — Telephone Encounter (Signed)
Rx sent to preferred pharmacy.

## 2021-12-06 MED ORDER — FREESTYLE LIBRE 3 SENSOR MISC: 1.0000 | 3 refills | Status: DC

## 2021-12-07 ENCOUNTER — Other Ambulatory Visit: Payer: Self-pay

## 2021-12-07 ENCOUNTER — Encounter (HOSPITAL_COMMUNITY)
Admission: RE | Disposition: A | Discharge status: Home / Self Care | Payer: Self-pay | Source: Home / Self Care | Attending: Gastroenterology

## 2021-12-07 ENCOUNTER — Ambulatory Visit (HOSPITAL_BASED_OUTPATIENT_CLINIC_OR_DEPARTMENT_OTHER): Payer: 59 | Admitting: Anesthesiology

## 2021-12-07 ENCOUNTER — Ambulatory Visit (HOSPITAL_COMMUNITY)
Admission: RE | Admit: 2021-12-07 | Discharge: 2021-12-07 | Disposition: A | Discharge status: Home / Self Care | Payer: 59 | Attending: Gastroenterology | Admitting: Gastroenterology

## 2021-12-07 ENCOUNTER — Encounter (HOSPITAL_COMMUNITY): Payer: Self-pay | Admitting: Gastroenterology

## 2021-12-07 ENCOUNTER — Ambulatory Visit (HOSPITAL_COMMUNITY): Payer: 59 | Admitting: Anesthesiology

## 2021-12-07 DIAGNOSIS — E785 Hyperlipidemia, unspecified: Secondary | ICD-10-CM | POA: Diagnosis not present

## 2021-12-07 DIAGNOSIS — Z1212 Encounter for screening for malignant neoplasm of rectum: Secondary | ICD-10-CM

## 2021-12-07 DIAGNOSIS — Z794 Long term (current) use of insulin: Secondary | ICD-10-CM | POA: Insufficient documentation

## 2021-12-07 DIAGNOSIS — K640 First degree hemorrhoids: Secondary | ICD-10-CM | POA: Insufficient documentation

## 2021-12-07 DIAGNOSIS — Z800 Family history of malignant neoplasm of digestive organs: Secondary | ICD-10-CM

## 2021-12-07 DIAGNOSIS — K3189 Other diseases of stomach and duodenum: Secondary | ICD-10-CM | POA: Insufficient documentation

## 2021-12-07 DIAGNOSIS — K219 Gastro-esophageal reflux disease without esophagitis: Secondary | ICD-10-CM | POA: Diagnosis not present

## 2021-12-07 DIAGNOSIS — K449 Diaphragmatic hernia without obstruction or gangrene: Secondary | ICD-10-CM

## 2021-12-07 DIAGNOSIS — I129 Hypertensive chronic kidney disease with stage 1 through stage 4 chronic kidney disease, or unspecified chronic kidney disease: Secondary | ICD-10-CM | POA: Diagnosis not present

## 2021-12-07 DIAGNOSIS — R1310 Dysphagia, unspecified: Secondary | ICD-10-CM | POA: Insufficient documentation

## 2021-12-07 DIAGNOSIS — K222 Esophageal obstruction: Secondary | ICD-10-CM

## 2021-12-07 DIAGNOSIS — E1122 Type 2 diabetes mellitus with diabetic chronic kidney disease: Secondary | ICD-10-CM | POA: Insufficient documentation

## 2021-12-07 DIAGNOSIS — N183 Chronic kidney disease, stage 3 unspecified: Secondary | ICD-10-CM | POA: Diagnosis not present

## 2021-12-07 DIAGNOSIS — E039 Hypothyroidism, unspecified: Secondary | ICD-10-CM | POA: Insufficient documentation

## 2021-12-07 DIAGNOSIS — Z7984 Long term (current) use of oral hypoglycemic drugs: Secondary | ICD-10-CM | POA: Diagnosis not present

## 2021-12-07 DIAGNOSIS — Z1211 Encounter for screening for malignant neoplasm of colon: Secondary | ICD-10-CM | POA: Insufficient documentation

## 2021-12-07 HISTORY — PX: COLONOSCOPY WITH PROPOFOL: SHX5780

## 2021-12-07 HISTORY — PX: SAVORY DILATION: SHX5439

## 2021-12-07 HISTORY — PX: BIOPSY: SHX5522

## 2021-12-07 HISTORY — PX: ESOPHAGOGASTRODUODENOSCOPY (EGD) WITH PROPOFOL: SHX5813

## 2021-12-07 LAB — GLUCOSE, CAPILLARY: Glucose-Capillary: 148 mg/dL — ABNORMAL HIGH (ref 70–99)

## 2021-12-07 SURGERY — ESOPHAGOGASTRODUODENOSCOPY (EGD) WITH PROPOFOL
Anesthesia: Monitor Anesthesia Care

## 2021-12-07 MED ORDER — LACTATED RINGERS IV SOLN
INTRAVENOUS | Status: DC
  Administered 2021-12-07: 1000 mL via INTRAVENOUS

## 2021-12-07 MED ORDER — PROPOFOL 1000 MG/100ML IV EMUL
INTRAVENOUS | Status: AC
  Filled 2021-12-07: qty 100

## 2021-12-07 MED ORDER — LIDOCAINE HCL (CARDIAC) PF 100 MG/5ML IV SOSY
PREFILLED_SYRINGE | INTRAVENOUS | Status: DC | PRN
Start: 2021-12-07 — End: 2021-12-07
  Administered 2021-12-07: 100 mg via INTRAVENOUS

## 2021-12-07 MED ORDER — PROPOFOL 500 MG/50ML IV EMUL
INTRAVENOUS | Status: DC | PRN
Start: 2021-12-07 — End: 2021-12-07
  Administered 2021-12-07: 300 ug/kg/min via INTRAVENOUS

## 2021-12-07 SURGICAL SUPPLY — 25 items

## 2021-12-07 NOTE — Anesthesia Procedure Notes (Signed)
Procedure Name: [place] ?Date/Time: 12/07/2021 9:14 AM ?Performed by: Lissa Morales, CRNA ?Pre-anesthesia Checklist: Patient identified, Suction available, Emergency Drugs available, Patient being monitored and Timeout performed ?Patient Re-evaluated:Patient Re-evaluated prior to induction ?Oxygen Delivery Method: Simple face mask ?Preoxygenation: Pre-oxygenation with 100% oxygen ?Placement Confirmation: positive ETCO2 ? ? ? ? ?

## 2021-12-07 NOTE — Discharge Instructions (Signed)
YOU HAD AN ENDOSCOPIC PROCEDURE TODAY: Refer to the procedure report and other information in the discharge instructions given to you for any specific questions about what was found during the examination. If this information does not answer your questions, please call [place] office at [phone] to clarify.  ° °YOU SHOULD EXPECT: Some feelings of bloating in the abdomen. Passage of more gas than usual. Walking can help get rid of the air that was put into your GI tract during the procedure and reduce the bloating. If you had a lower endoscopy (such as a colonoscopy or flexible sigmoidoscopy) you may notice spotting of blood in your stool or on the toilet paper. Some abdominal soreness may be present for a day or two, also. ° °DIET: Your first meal following the procedure should be a light meal and then it is ok to progress to your normal diet. A half-sandwich or bowl of soup is an example of a good first meal. Heavy or fried foods are harder to digest and may make you feel nauseous or bloated. Drink plenty of fluids but you should avoid alcoholic beverages for 24 hours. If you had a esophageal dilation, please see attached instructions for diet.   ° °ACTIVITY: Your care partner should take you home directly after the procedure. You should plan to take it easy, moving slowly for the rest of the day. You can resume normal activity the day after the procedure however YOU SHOULD NOT DRIVE, use power tools, machinery or perform tasks that involve climbing or major physical exertion for 24 hours (because of the sedation medicines used during the test).  ° °SYMPTOMS TO REPORT IMMEDIATELY: °A gastroenterologist can be reached at any hour. Please call [phone]  for any of the following symptoms:  °Following lower endoscopy (colonoscopy, flexible sigmoidoscopy) °Excessive amounts of blood in the stool  °Significant tenderness, worsening of abdominal pains  °Swelling of the abdomen that is new, acute  °Fever of 100° or  higher  °Following upper endoscopy (EGD, EUS, ERCP, esophageal dilation) °Vomiting of blood or coffee ground material  °New, significant abdominal pain  °New, significant chest pain or pain under the shoulder blades  °Painful or persistently difficult swallowing  °New shortness of breath  °Black, tarry-looking or red, bloody stools ° °FOLLOW UP:  °If any biopsies were taken you will be contacted by phone or by letter within the next 1-3 weeks. Call [phone]  if you have not heard about the biopsies in 3 weeks.  °Please also call with any specific questions about appointments or follow up tests. ° °

## 2021-12-07 NOTE — Op Note (Signed)
[facility] ?Patient Name: Mitchell Palmer ?Procedure Date: 12/07/2021 ?MRN: [id number] ?Attending MD: Ladene Artist , MD ?Date of Birth: 09-23-1965 ?CSN: [id number] ?Age: 56 ?Admit Type: Outpatient ?Procedure:                Colonoscopy ?Indications:              Screening in patient at increased risk: Family  ?                          history of 1st-degree relative with colorectal  ?                          cancer before age 29 years ?Providers:                Pricilla Riffle. Fuller Plan, MD, Jaci Carrel, RN, Charlean Merl  ?                          Purcell Nails, Technician, Enrigue Catena, CRNA ?Referring MD:             Wynelle Fanny. Tower, MD ?Medicines:                Monitored Anesthesia Care ?Complications:            No immediate complications. Estimated blood loss:  ?                          None. ?Estimated Blood Loss:     Estimated blood loss: none. ?Procedure:                Pre-Anesthesia Assessment: ?                          - Prior to the procedure, a History and Physical  ?                          was performed, and patient medications and  ?                          allergies were reviewed. The patient's tolerance of  ?                          previous anesthesia was also reviewed. The risks  ?                          and benefits of the procedure and the sedation  ?                          options and risks were discussed with the patient.  ?                          All questions were answered, and informed consent  ?                          was obtained. Prior Anticoagulants: The patient has  ?  taken no previous anticoagulant or antiplatelet  ?                          agents. ASA Grade Assessment: II - A patient with  ?                          mild systemic disease. After reviewing the risks  ?                          and benefits, the patient was deemed in  ?                          satisfactory condition to undergo the procedure. ?                          After  obtaining informed consent, the colonoscope  ?                          was passed under direct vision. Throughout the  ?                          procedure, the patient's blood pressure, pulse, and  ?                          oxygen saturations were monitored continuously. The  ?                          CF-HQ190L ([id number]) Olympus colonoscope was  ?                          introduced through the anus and advanced to the the  ?                          cecum, identified by appendiceal orifice and  ?                          ileocecal valve. The ileocecal valve, appendiceal  ?                          orifice, and rectum were photographed. The quality  ?                          of the bowel preparation was good. The colonoscopy  ?                          was performed without difficulty. The patient  ?                          tolerated the procedure well. ?Scope In: 9:20:26 AM ?Scope Out: 9:36:24 AM ?Scope Withdrawal Time: 0 hours 11 minutes 36 seconds  ?Total Procedure Duration: 0 hours 15 minutes 58 seconds  ?Findings: ?     The perianal and digital rectal examinations were normal. ?     Internal hemorrhoids were found during retroflexion. The hemorrhoids  ?  were small and Grade I (internal hemorrhoids that do not prolapse). ?     The exam was otherwise without abnormality on direct and retroflexion  ?     views. ?Impression:               - Internal hemorrhoids. ?                          - The examination was otherwise normal on direct  ?                          and retroflexion views. ?                          - No specimens collected. ?Moderate Sedation: ?     Not Applicable - Patient had care per Anesthesia. ?Recommendation:           - Repeat colonoscopy in 5 years for surveillance. ?                          - Patient has a contact number available for  ?                          emergencies. The signs and symptoms of potential  ?                          delayed complications were discussed with  the  ?                          patient. Return to normal activities tomorrow.  ?                          Written discharge instructions were provided to the  ?                          patient. ?                          - Resume previous diet. ?                          - Continue present medications. ?Procedure Code(s):        --- Professional --- ?                          G0105, Colorectal cancer screening; colonoscopy on  ?                          individual at high risk ?Diagnosis Code(s):        --- Professional --- ?                          Z80.0, Family history of malignant neoplasm of  ?                          digestive organs ?  K64.0, First degree hemorrhoids ?CPT copyright 2019 [employer]. All rights reserved. ?The codes documented in this report are preliminary and upon coder review may  ?be revised to meet current compliance requirements. ?Ladene Artist, MD ?12/07/2021 9:51:11 AM ?This report has been signed electronically. ?Number of Addenda: 0 ?

## 2021-12-07 NOTE — Anesthesia Postprocedure Evaluation (Signed)
Anesthesia Post Note ? ?Patient: Mitchell Palmer ? ?Procedure(s) Performed: ESOPHAGOGASTRODUODENOSCOPY (EGD) WITH PROPOFOL ?COLONOSCOPY WITH PROPOFOL ?SAVORY DILATION ?BIOPSY ? ?  ? ?Patient location during evaluation: PACU ?Anesthesia Type: MAC ?Level of consciousness: awake and alert ?Pain management: pain level controlled ?Vital Signs Assessment: post-procedure vital signs reviewed and stable ?Respiratory status: spontaneous breathing, nonlabored ventilation and respiratory function stable ?Cardiovascular status: blood pressure returned to baseline ?Postop Assessment: no apparent nausea or vomiting ?Anesthetic complications: no ? ? ?No notable events documented. ? ?Last Vitals:  ?Vitals:  ? 12/07/21 1020 12/07/21 1025  ?BP: 119/73   ?Pulse: 72 67  ?Resp: 18 13  ?Temp:    ?SpO2: 90% 95%  ?  ?Last Pain:  ?Vitals:  ? 12/07/21 0955  ?TempSrc: Oral  ?PainSc: 0-No pain  ? ? ?  ?  ?  ?  ?  ?  ? ?Marthenia Rolling ? ? ? ? ?

## 2021-12-07 NOTE — Interval H&P Note (Signed)
History and Physical Interval Note: ? ?12/07/2021 ?8:51 AM ? ?Mitchell Palmer  has presented today for surgery, with the diagnosis of dysphagia, family hx of colon cancer.  The various methods of treatment have been discussed with the patient and family. After consideration of risks, benefits and other options for treatment, the patient has consented to  Procedure(s): ?ESOPHAGOGASTRODUODENOSCOPY (EGD) WITH PROPOFOL (N/A) ?COLONOSCOPY WITH PROPOFOL (N/A) as a surgical intervention.  The patient's history has been reviewed, patient examined, no change in status, stable for surgery.  I have reviewed the patient's chart and labs.  Questions were answered to the patient's satisfaction.   ? ? ?Pricilla Riffle. Fuller Plan ? ? ?

## 2021-12-07 NOTE — Op Note (Signed)
[facility] ?Patient Name: Mitchell Palmer ?Procedure Date: 12/07/2021 ?MRN: [id number] ?Attending MD: Ladene Artist , MD ?Date of Birth: 22-Dec-1965 ?CSN: [id number] ?Age: 56 ?Admit Type: Outpatient ?Procedure:                Upper GI endoscopy ?Indications:              Dysphagia ?Providers:                Pricilla Riffle. Fuller Plan, MD, Jaci Carrel, RN, Charlean Merl  ?                          Purcell Nails, Technician, Enrigue Catena, CRNA ?Referring MD:              ?Medicines:                Monitored Anesthesia Care ?Complications:            No immediate complications. ?Estimated Blood Loss:     Estimated blood loss was minimal. ?Procedure:                Pre-Anesthesia Assessment: ?                          - Prior to the procedure, a History and Physical  ?                          was performed, and patient medications and  ?                          allergies were reviewed. The patient's tolerance of  ?                          previous anesthesia was also reviewed. The risks  ?                          and benefits of the procedure and the sedation  ?                          options and risks were discussed with the patient.  ?                          All questions were answered, and informed consent  ?                          was obtained. Prior Anticoagulants: The patient has  ?                          taken no previous anticoagulant or antiplatelet  ?                          agents. ASA Grade Assessment: II - A patient with  ?                          mild systemic disease. After reviewing the risks  ?  and benefits, the patient was deemed in  ?                          satisfactory condition to undergo the procedure. ?                          After obtaining informed consent, the endoscope was  ?                          passed under direct vision. Throughout the  ?                          procedure, the patient's blood pressure, pulse, and  ?                           oxygen saturations were monitored continuously. The  ?                          [id number]-[id number] ([id number]) Olympus endoscope was introduced  ?                          through the mouth, and advanced to the second part  ?                          of duodenum. The upper GI endoscopy was  ?                          accomplished without difficulty. The patient  ?                          tolerated the procedure well. ?Scope In: ?Scope Out: ?Findings: ?     One benign-appearing, intrinsic mild stenosis was found at the  ?     gastroesophageal junction. This stenosis measured 1.4 cm (inner  ?     diameter) x less than one cm (in length). The stenosis was traversed. A  ?     guidewire was placed and the scope was withdrawn. Dilation was performed  ?     with a Savary dilator with mild resistance at 17 mm. ?     The exam of the esophagus was otherwise normal. ?     A small hiatal hernia was present. ?     Patchy mildly erythematous mucosa without bleeding was found on the  ?     greater curvature of the stomach and in the gastric antrum. Biopsies  ?     were taken with a cold forceps for histology. ?     The exam of the stomach was otherwise normal. ?     The duodenal bulb and second portion of the duodenum were normal. ?Impression:               - Benign-appearing esophageal stenosis. Dilated. ?                          - Small hiatal hernia. ?                          -  Erythematous mucosa in the greater curvature and  ?                          antrum. Biopsied. ?                          - Normal duodenal bulb and second portion of the  ?                          duodenum. ?Moderate Sedation: ?     Not Applicable - Patient had care per Anesthesia. ?Recommendation:           - Clear liquid diet fro 2 hours, then advance as  ?                          tolerated to soft diet today. ?                          - Resume prior diet tomorrow. ?                          - Follow antireflux measures. ?                          -  Continue present medications. ?                          - Protonix (pantoprazole) 40 mg PO daily  ?                          indefinitely. ?                          - Await pathology results. ?Procedure Code(s):        --- Professional --- ?                          [id number], Esophagogastroduodenoscopy, flexible,  ?                          transoral; with insertion of guide wire followed by  ?                          passage of dilator(s) through esophagus over guide  ?                          wire ?                          43239, 59, Esophagogastroduodenoscopy, flexible,  ?                          transoral; with biopsy, single or multiple ?Diagnosis Code(s):        --- Professional --- ?                          K22.2, Esophageal obstruction ?  K44.9, Diaphragmatic hernia without obstruction or  ?                          gangrene ?                          K31.89, Other diseases of stomach and duodenum ?                          R13.10, Dysphagia, unspecified ?CPT copyright 2019 American Medical Association. All rights reserved. ?The codes documented in this report are preliminary and upon coder review may  ?be revised to meet current compliance requirements. ?Ladene Artist, MD ?12/07/2021 9:56:13 AM ?This report has been signed electronically. ?Number of Addenda: 0 ?

## 2021-12-07 NOTE — Transfer of Care (Signed)
Immediate Anesthesia Transfer of Care Note ? ?Patient: Mitchell Palmer ? ?Procedure(s) Performed: ESOPHAGOGASTRODUODENOSCOPY (EGD) WITH PROPOFOL ?COLONOSCOPY WITH PROPOFOL ?SAVORY DILATION ?BIOPSY ? ?Patient Location: PACU ? ?Anesthesia Type:MAC ? ?Level of Consciousness: awake and patient cooperative ? ?Airway & Oxygen Therapy: Patient Spontanous Breathing and Patient connected to face mask oxygen ? ?Post-op Assessment: Report given to RN, Post -op Vital signs reviewed and stable and Patient moving all extremities X 4 ? ?Post vital signs: stable ? ?Last Vitals:  ?Vitals Value Taken Time  ?BP    ?Temp    ?Pulse 74 12/07/21 1001  ?Resp 15 12/07/21 1001  ?SpO2 90 % 12/07/21 1001  ?Vitals shown include unvalidated device data. ? ?Last Pain:  ?Vitals:  ? 12/07/21 0830  ?TempSrc: Temporal  ?PainSc: 0-No pain  ?   ? ?  ? ?Complications: No notable events documented. ?

## 2021-12-07 NOTE — Anesthesia Preprocedure Evaluation (Addendum)
Anesthesia Evaluation  ?Patient identified by MRN, date of birth, ID band ?Patient awake ? ? ? ?Reviewed: ?Allergy & Precautions, NPO status , Patient's Chart, lab work & pertinent test results ? ?History of Anesthesia Complications ?Negative for: history of anesthetic complications ? ?Airway ?Mallampati: II ? ?TM Distance: >3 FB ?Neck ROM: Full ? ? ? Dental ?no notable dental hx. ? ?  ?Pulmonary ?neg pulmonary ROS,  ?  ?Pulmonary exam normal ? ? ? ? ? ? ? Cardiovascular ?hypertension, Pt. on medications ?Normal cardiovascular exam ? ? ?  ?Neuro/Psych ?negative neurological ROS ? negative psych ROS  ? GI/Hepatic ?Neg liver ROS, GERD  Medicated and Controlled,Esophageal stricture ?  ?Endo/Other  ?diabetes, Type 2, Insulin Dependent, Oral Hypoglycemic AgentsHypothyroidism  ? Renal/GU ?negative Renal ROS  ?negative genitourinary ?  ?Musculoskeletal ?negative musculoskeletal ROS ?(+)  ? Abdominal ?  ?Peds ? Hematology ?negative hematology ROS ?(+)   ?Anesthesia Other Findings ?Day of surgery medications reviewed with patient. ? Reproductive/Obstetrics ?negative OB ROS ? ?  ? ? ? ? ? ? ? ? ? ? ? ? ? ?  ?  ? ? ? ? ? ? ? ?Anesthesia Physical ?Anesthesia Plan ? ?ASA: 2 ? ?Anesthesia Plan: MAC  ? ?Post-op Pain Management: Minimal or no pain anticipated  ? ?Induction:  ? ?PONV Risk Score and Plan: Treatment may vary due to age or medical condition and Propofol infusion ? ?Airway Management Planned: Natural Airway and Nasal Cannula ? ?Additional Equipment: None ? ?Intra-op Plan:  ? ?Post-operative Plan:  ? ?Informed Consent: I have reviewed the patients History and Physical, chart, labs and discussed the procedure including the risks, benefits and alternatives for the proposed anesthesia with the patient or authorized representative who has indicated his/her understanding and acceptance.  ? ? ? ? ? ?Plan Discussed with: CRNA ? ?Anesthesia Plan Comments:   ? ? ? ? ? ?Anesthesia Quick  Evaluation ? ?

## 2021-12-08 LAB — SURGICAL PATHOLOGY

## 2021-12-12 ENCOUNTER — Encounter (INDEPENDENT_AMBULATORY_CARE_PROVIDER_SITE_OTHER): Payer: 59 | Admitting: Ophthalmology

## 2021-12-12 ENCOUNTER — Ambulatory Visit (INDEPENDENT_AMBULATORY_CARE_PROVIDER_SITE_OTHER): Payer: 59 | Admitting: Ophthalmology

## 2021-12-12 ENCOUNTER — Encounter: Payer: Self-pay | Admitting: Gastroenterology

## 2021-12-12 ENCOUNTER — Telehealth: Payer: Self-pay

## 2021-12-12 ENCOUNTER — Encounter (INDEPENDENT_AMBULATORY_CARE_PROVIDER_SITE_OTHER): Payer: Self-pay | Admitting: Ophthalmology

## 2021-12-12 DIAGNOSIS — H43822 Vitreomacular adhesion, left eye: Secondary | ICD-10-CM

## 2021-12-12 DIAGNOSIS — R0683 Snoring: Secondary | ICD-10-CM | POA: Insufficient documentation

## 2021-12-12 DIAGNOSIS — E113551 Type 2 diabetes mellitus with stable proliferative diabetic retinopathy, right eye: Secondary | ICD-10-CM

## 2021-12-12 DIAGNOSIS — E113512 Type 2 diabetes mellitus with proliferative diabetic retinopathy with macular edema, left eye: Secondary | ICD-10-CM

## 2021-12-12 MED ORDER — BEVACIZUMAB 2.5 MG/0.1ML IZ SOSY
2.5000 mg | PREFILLED_SYRINGE | INTRAVITREAL | Status: AC | PRN
  Administered 2021-12-12: 2.5 mg via INTRAVITREAL

## 2021-12-12 NOTE — Assessment & Plan Note (Addendum)
Recurrent center involved CSME OS, will retreat today a little over 6-week postinjection Avastin to maintain ? ?I have also asked patient to consider sleep apnea testing so as to minimize nightly hypoxia and hypertensive episodes static events when sleep apnea present and untreated fashion ? ?OS much worse today, sooner involved, at 6 weeks post Avastin.  Likely exacerbation by nightly hypoxia from unknown diagnosed and/or untreated sleep apnea, pending evaluation ?

## 2021-12-12 NOTE — Telephone Encounter (Signed)
I placed a referral to pulmonary  ?We have to start with a visit and consult  ? ?Sleep apnea can cause some vascular problems also so I agree with Dr Dahlia Bailiff recommendation  ?

## 2021-12-12 NOTE — Assessment & Plan Note (Signed)
Postop component of CME CSME OS ?

## 2021-12-12 NOTE — Assessment & Plan Note (Signed)
Recurrent CME temporally OD this could be MAC-TEL type of watershed region of CME, CSME due to untreated sleep apnea that he has a positive review of systems for ?

## 2021-12-12 NOTE — Telephone Encounter (Signed)
Patient calling states he just saw Dr Zadie Rhine -notes in epic, and was told to contact Dr Glori Bickers to ask about referral for sleep study or home sleep study test-I asked what symptoms he was having or the reason for needing this and patient said "Dr Zadie Rhine sent Dr Glori Bickers his notes and she can review this." He does not have preference on location for referral. ?

## 2021-12-12 NOTE — Progress Notes (Signed)
? ? ?12/12/2021 ? ?  ? ?CHIEF COMPLAINT ?Patient presents for  ?Chief Complaint  ?Patient presents with  ? Diabetic Retinopathy with Macular Edema  ? ? ? ? ?HISTORY OF PRESENT ILLNESS: ?Mitchell Palmer is a 56 y.o. male who presents to the clinic today for:  ? ?HPI   ?7 weeks dilate OS, Avastin OS, OCT.  ?Pt states "I can see out of it but it is more blurred, it started this past Thursday. Left only." ?Patient is not using any prescription eye drops.  ?Patient is not allergic to anything we use for the injection. No hospitalizations or surgeries since last visit. ?Last edited by Laurin Coder on 12/12/2021  9:10 AM.  ?  ? ? ?Referring physician: ?Tower, Wynelle Fanny, MD ?[address] ?[place],  [place] [id number] ? ?HISTORICAL INFORMATION:  ? ?Selected notes from the [facility] ?  ? ?Lab Results  ?Component Value Date  ? HGBA1C 8.3 (A) 11/06/2021  ?  ? ?CURRENT MEDICATIONS: ?No current outpatient medications on file. (Ophthalmic Drugs)  ? ?No current facility-administered medications for this visit. (Ophthalmic Drugs)  ? ?Current Outpatient Medications (Other)  ?Medication Sig  ? B-D UF III MINI PEN NEEDLES 31G X 5 MM MISC USE AS DIRECTED 4 TIMES A DAY  ? cetirizine (ZYRTEC) 10 MG tablet Take 10 mg by mouth daily.  ? Cholecalciferol (VITAMIN D) 50 MCG (2000 UT) tablet Take 2,000 Units by mouth daily.  ? Continuous Blood Gluc Sensor (FREESTYLE LIBRE 3 SENSOR) MISC 1 applicator by Does not apply route every 14 (fourteen) days.  ? cyclobenzaprine (FLEXERIL) 10 MG tablet Take 1 tablet (10 mg total) by mouth 3 (three) times daily as needed.  ? empagliflozin (JARDIANCE) 25 MG TABS tablet Take 1 tablet (25 mg total) by mouth daily before breakfast.  ? fluticasone (FLONASE) 50 MCG/ACT nasal spray Place 2 sprays into both nostrils daily as needed for allergies or rhinitis.  ? insulin lispro (HUMALOG KWIKPEN) 100 UNIT/ML KwikPen INJECT 15-25 UNITS INTO THE SKIN 3 TIMES DAILY W/ MEALS AND 10-12 UNITS INTO THE SKIN W/  SNACKS (Patient taking differently: Inject 10-30 Units into the skin 3 (three) times daily. INJECT 25-30 UNITS INTO THE SKIN 3 TIMES DAILY W/ MEALS AND 10-12 UNITS INTO THE SKIN W/ SNACKS)  ? LANTUS SOLOSTAR 100 UNIT/ML Solostar Pen INJECT 42 UNITS IN THE MORNING AND 50 UNITS IN THE EVENING  ? levothyroxine (SYNTHROID) 75 MCG tablet TAKE 1 TABLET BY MOUTH ONCE A DAY ON AN EMPTY STOMACH 30 MINUTES BEFORE EATING.  ? metFORMIN (GLUCOPHAGE) 1000 MG tablet Take 1 tablet (1,000 mg total) by mouth 2 (two) times daily with a meal.  ? olmesartan (BENICAR) 40 MG tablet TAKE 1 TABLET BY MOUTH EVERY DAY  ? omeprazole (PRILOSEC) 40 MG capsule TAKE 1 CAPSULE (40 MG TOTAL) BY MOUTH DAILY.  ? ONETOUCH ULTRA test strip USE TO TEST BLOOD SUGAR 4 TIMES DAILY AS DIRECTED  ? rosuvastatin (CRESTOR) 10 MG tablet Take 1 tablet (10 mg total) by mouth daily.  ? tadalafil (CIALIS) 20 MG tablet Take 1 tablet (20 mg total) by mouth as directed. (Patient taking differently: Take 20 mg by mouth daily as needed for erectile dysfunction.)  ? tirzepatide (MOUNJARO) 10 MG/0.5ML Pen Inject 10 mg into the skin once a week.  ? vitamin B-12 (CYANOCOBALAMIN) 1000 MCG tablet Take 1,000 mcg by mouth daily.  ? ?No current facility-administered medications for this visit. (Other)  ? ? ? ? ?REVIEW OF SYSTEMS: ?  ROS   ?Negative for: Constitutional, Gastrointestinal, Neurological, Skin, Genitourinary, Musculoskeletal, HENT, Endocrine, Cardiovascular, Eyes, Respiratory, Psychiatric, Allergic/Imm, Heme/Lymph ?Last edited by Hurman Horn, MD on 12/12/2021  9:37 AM.  ?  ? ? ? ?ALLERGIES ?Allergies  ?Allergen Reactions  ? Codeine   ?  Anxious  ? Lipitor [Atorvastatin]   ?  Muscle pain   ? ? ?PAST MEDICAL HISTORY ?Past Medical History:  ?Diagnosis Date  ? Allergy   ? allegic rhinitis  ? Diabetes mellitus   ? type II  ? Esophageal stricture   ? GERD (gastroesophageal reflux disease)   ? History of hernia repair   ? as a baby  ? Hyperlipidemia   ? Hyperplastic polyp of  intestine 2010  ? Hypertension   ? Proliferative diabetic retinopathy of left eye ([place]) 03/31/2020  ? This is active as a patient still has tufts of the anterior segment neovascularization, rubeosis in the left eye every region of capillary nonperfusion should be treated with PRP.  ? S/P ear surgery, follow-up exam   ? for drainage  ? Vitreous hemorrhage of right eye ([place]) 12/24/2019  ? ?Past Surgical History:  ?Procedure Laterality Date  ? BIOPSY  12/07/2021  ? Procedure: BIOPSY;  Surgeon: Ladene Artist, MD;  Location: [facility];  Service: Gastroenterology;;  ? CATARACT EXTRACTION    ? COLONOSCOPY  03/07/2016  ? COLONOSCOPY WITH PROPOFOL N/A 12/07/2021  ? Procedure: COLONOSCOPY WITH PROPOFOL;  Surgeon: Ladene Artist, MD;  Location: [facility];  Service: Gastroenterology;  Laterality: N/A;  ? ESOPHAGOGASTRODUODENOSCOPY  04/20/2009  ? stricture/GERD  ? ESOPHAGOGASTRODUODENOSCOPY (EGD) WITH PROPOFOL N/A 12/07/2021  ? Procedure: ESOPHAGOGASTRODUODENOSCOPY (EGD) WITH PROPOFOL;  Surgeon: Ladene Artist, MD;  Location: [facility];  Service: Gastroenterology;  Laterality: N/A;  ? EYE SURGERY    ? HERNIA REPAIR    ? age 71-midline  ? INNER EAR SURGERY  08/20/1986  ? went in behind rt ear-blockage  ? NECK SURGERY    ? [place] [facility]  ? POLYPECTOMY    ? SAVORY DILATION N/A 12/07/2021  ? Procedure: SAVORY DILATION;  Surgeon: Ladene Artist, MD;  Location: [facility];  Service: Gastroenterology;  Laterality: N/A;  ? SHOULDER ARTHROSCOPY WITH ROTATOR CUFF REPAIR AND SUBACROMIAL DECOMPRESSION Right 09/29/2012  ? Procedure: SHOULDER ARTHROSCOPY WITH ROTATOR CUFF REPAIR AND SUBACROMIAL DECOMPRESSION;  Surgeon: Nita Sells, MD;  Location: [facility];  Service: Orthopedics;  Laterality: Right;  Right shoulder arthroscopy with subacromial decompression and distal clavicle excision & Debridement of Labrial Tear  ? SHOULDER SURGERY Left   ? x2  ? UPPER GASTROINTESTINAL ENDOSCOPY    ? ? ?FAMILY  HISTORY ?Family History  ?Problem Relation Age of Onset  ? Diabetes Mother   ? Colon cancer Father 4  ? Diabetes Other   ? Colon polyps Brother   ? Diabetes Maternal Aunt   ? Cancer Cousin   ?     breast cancer  ? ? ?SOCIAL HISTORY ?Social History  ? ?Tobacco Use  ? Smoking status: Never  ? Smokeless tobacco: Never  ?Vaping Use  ? Vaping Use: Never used  ?Substance Use Topics  ? Alcohol use: Yes  ?  Alcohol/week: 12.0 standard drinks  ?  Types: 12 Cans of beer per week  ? Drug use: No  ? ?  ? ?  ? ?OPHTHALMIC EXAM: ? ?Base Eye Exam   ? ? Visual Acuity (ETDRS)   ? ?   Right Left  ? Dist cc 20/20 20/20  ? ?  Correction: Glasses  ?OS: "looks like it is looking through a black screen."  ? ?  ?  ? ? Tonometry (Tonopen, 9:13 AM)   ? ?   Right Left  ? Pressure 7 5  ? ?  ?  ? ? Pupils   ? ?   Pupils Dark Light APD  ? Right PERRL 3 2 None  ? Left PERRL 3 2 None  ? ?  ?  ? ? Extraocular Movement   ? ?   Right Left  ?  Full Full  ? ?  ?  ? ? Neuro/Psych   ? ? Oriented x3: Yes  ? Mood/Affect: Normal  ? ?  ?  ? ? Dilation   ? ? Left eye: 1.0% Mydriacyl, 2.5% Phenylephrine @ 9:13 AM  ? ?  ?  ? ?  ? ?Slit Lamp and Fundus Exam   ? ? External Exam   ? ?   Right Left  ? External Normal Normal  ? ?  ?  ? ? Slit Lamp Exam   ? ?   Right Left  ? Lids/Lashes Normal Normal  ? Conjunctiva/Sclera White and quiet White and quiet  ? Cornea Clear Clear  ? Anterior Chamber Deep and quiet Deep and quiet  ? Iris Round and reactive Round and reactive, visible remnants of iris neovascularization, undilated  ? Lens Posterior chamber intraocular lens,, , 1+ Posterior capsular opacification Posterior chamber intraocular lens,, , 1+ Posterior capsular opacification  ? Anterior Vitreous Normal Normal  ? ?  ?  ? ? Fundus Exam   ? ?   Right Left  ? Posterior Vitreous  no posterior vitreous detachment  ? Disc  Normal  ? C/D Ratio  0.2  ? Macula  Microaneurysms, Macular thickening, Mild clinically significant macular edema  ? Vessels  Proliferative diabetic  retinopathy, with good PRP yet there are a temporal window anterior to the equator in the left eye in addition to the region temporal to the macula in the left eye with large regions of capillary nonperfusi

## 2021-12-14 ENCOUNTER — Encounter (INDEPENDENT_AMBULATORY_CARE_PROVIDER_SITE_OTHER): Payer: 59 | Admitting: Ophthalmology

## 2021-12-14 NOTE — Telephone Encounter (Signed)
Pt notified of Dr. Marliss Coots comments and that referral was placed. If he doesn't hear anything back on referral in 2 weeks will call back and let us know ?

## 2021-12-26 ENCOUNTER — Encounter (INDEPENDENT_AMBULATORY_CARE_PROVIDER_SITE_OTHER): Payer: Self-pay | Admitting: Ophthalmology

## 2021-12-26 ENCOUNTER — Ambulatory Visit (INDEPENDENT_AMBULATORY_CARE_PROVIDER_SITE_OTHER): Payer: 59 | Admitting: Ophthalmology

## 2021-12-26 ENCOUNTER — Encounter (INDEPENDENT_AMBULATORY_CARE_PROVIDER_SITE_OTHER): Payer: 59 | Admitting: Ophthalmology

## 2021-12-26 DIAGNOSIS — R0683 Snoring: Secondary | ICD-10-CM

## 2021-12-26 DIAGNOSIS — E113551 Type 2 diabetes mellitus with stable proliferative diabetic retinopathy, right eye: Secondary | ICD-10-CM | POA: Diagnosis not present

## 2021-12-26 DIAGNOSIS — E113512 Type 2 diabetes mellitus with proliferative diabetic retinopathy with macular edema, left eye: Secondary | ICD-10-CM | POA: Diagnosis not present

## 2021-12-26 DIAGNOSIS — E113511 Type 2 diabetes mellitus with proliferative diabetic retinopathy with macular edema, right eye: Secondary | ICD-10-CM | POA: Diagnosis not present

## 2021-12-26 MED ORDER — BEVACIZUMAB 2.5 MG/0.1ML IZ SOSY
2.5000 mg | PREFILLED_SYRINGE | INTRAVITREAL | Status: AC | PRN
  Administered 2021-12-26: 2.5 mg via INTRAVITREAL

## 2021-12-26 NOTE — Progress Notes (Signed)
? ? ?12/26/2021 ? ?  ? ?CHIEF COMPLAINT ?Patient presents for  ?Chief Complaint  ?Patient presents with  ? Diabetic Retinopathy with Macular Edema  ? ? ? ? ?HISTORY OF PRESENT ILLNESS: ?Mitchell Palmer is a 56 y.o. male who presents to the clinic today for:  ? ?HPI   ?2 weeks for DILATE OD AVASTIN OCT. ?Pt states left eye has gotten better, pt is able to see the screen. ?Pt denies floaters and FOL ?Last edited by Silvestre Moment on 12/26/2021  9:58 AM.  ?  ? ? ?Referring physician: ?Tower, Wynelle Fanny, MD ?[address] ?[place],  [place] [id number] ? ?HISTORICAL INFORMATION:  ? ?Selected notes from the [facility] ?  ? ?Lab Results  ?Component Value Date  ? HGBA1C 8.3 (A) 11/06/2021  ?  ? ?CURRENT MEDICATIONS: ?No current outpatient medications on file. (Ophthalmic Drugs)  ? ?No current facility-administered medications for this visit. (Ophthalmic Drugs)  ? ?Current Outpatient Medications (Other)  ?Medication Sig  ? B-D UF III MINI PEN NEEDLES 31G X 5 MM MISC USE AS DIRECTED 4 TIMES A DAY  ? cetirizine (ZYRTEC) 10 MG tablet Take 10 mg by mouth daily.  ? Cholecalciferol (VITAMIN D) 50 MCG (2000 UT) tablet Take 2,000 Units by mouth daily.  ? Continuous Blood Gluc Sensor (FREESTYLE LIBRE 3 SENSOR) MISC 1 applicator by Does not apply route every 14 (fourteen) days.  ? cyclobenzaprine (FLEXERIL) 10 MG tablet Take 1 tablet (10 mg total) by mouth 3 (three) times daily as needed.  ? empagliflozin (JARDIANCE) 25 MG TABS tablet Take 1 tablet (25 mg total) by mouth daily before breakfast.  ? fluticasone (FLONASE) 50 MCG/ACT nasal spray Place 2 sprays into both nostrils daily as needed for allergies or rhinitis.  ? insulin lispro (HUMALOG KWIKPEN) 100 UNIT/ML KwikPen INJECT 15-25 UNITS INTO THE SKIN 3 TIMES DAILY W/ MEALS AND 10-12 UNITS INTO THE SKIN W/ SNACKS (Patient taking differently: Inject 10-30 Units into the skin 3 (three) times daily. INJECT 25-30 UNITS INTO THE SKIN 3 TIMES DAILY W/ MEALS AND 10-12 UNITS INTO THE SKIN W/  SNACKS)  ? LANTUS SOLOSTAR 100 UNIT/ML Solostar Pen INJECT 42 UNITS IN THE MORNING AND 50 UNITS IN THE EVENING  ? levothyroxine (SYNTHROID) 75 MCG tablet TAKE 1 TABLET BY MOUTH ONCE A DAY ON AN EMPTY STOMACH 30 MINUTES BEFORE EATING.  ? metFORMIN (GLUCOPHAGE) 1000 MG tablet Take 1 tablet (1,000 mg total) by mouth 2 (two) times daily with a meal.  ? olmesartan (BENICAR) 40 MG tablet TAKE 1 TABLET BY MOUTH EVERY DAY  ? omeprazole (PRILOSEC) 40 MG capsule TAKE 1 CAPSULE (40 MG TOTAL) BY MOUTH DAILY.  ? ONETOUCH ULTRA test strip USE TO TEST BLOOD SUGAR 4 TIMES DAILY AS DIRECTED  ? rosuvastatin (CRESTOR) 10 MG tablet Take 1 tablet (10 mg total) by mouth daily.  ? tadalafil (CIALIS) 20 MG tablet Take 1 tablet (20 mg total) by mouth as directed. (Patient taking differently: Take 20 mg by mouth daily as needed for erectile dysfunction.)  ? tirzepatide (MOUNJARO) 10 MG/0.5ML Pen Inject 10 mg into the skin once a week.  ? vitamin B-12 (CYANOCOBALAMIN) 1000 MCG tablet Take 1,000 mcg by mouth daily.  ? ?No current facility-administered medications for this visit. (Other)  ? ? ? ? ?REVIEW OF SYSTEMS: ?ROS   ?Negative for: Constitutional, Gastrointestinal, Neurological, Skin, Genitourinary, Musculoskeletal, HENT, Endocrine, Cardiovascular, Eyes, Respiratory, Psychiatric, Allergic/Imm, Heme/Lymph ?Last edited by Silvestre Moment on 12/26/2021  9:58 AM.  ?  ? ? ? ?  ALLERGIES ?Allergies  ?Allergen Reactions  ? Codeine   ?  Anxious  ? Lipitor [Atorvastatin]   ?  Muscle pain   ? ? ?PAST MEDICAL HISTORY ?Past Medical History:  ?Diagnosis Date  ? Allergy   ? allegic rhinitis  ? Diabetes mellitus   ? type II  ? Esophageal stricture   ? GERD (gastroesophageal reflux disease)   ? History of hernia repair   ? as a baby  ? Hyperlipidemia   ? Hyperplastic polyp of intestine 2010  ? Hypertension   ? Proliferative diabetic retinopathy of left eye ([place]) 03/31/2020  ? This is active as a patient still has tufts of the anterior segment neovascularization,  rubeosis in the left eye every region of capillary nonperfusion should be treated with PRP.  ? S/P ear surgery, follow-up exam   ? for drainage  ? Vitreous hemorrhage of right eye ([place]) 12/24/2019  ? ?Past Surgical History:  ?Procedure Laterality Date  ? BIOPSY  12/07/2021  ? Procedure: BIOPSY;  Surgeon: Ladene Artist, MD;  Location: [facility];  Service: Gastroenterology;;  ? CATARACT EXTRACTION    ? COLONOSCOPY  03/07/2016  ? COLONOSCOPY WITH PROPOFOL N/A 12/07/2021  ? Procedure: COLONOSCOPY WITH PROPOFOL;  Surgeon: Ladene Artist, MD;  Location: [facility];  Service: Gastroenterology;  Laterality: N/A;  ? ESOPHAGOGASTRODUODENOSCOPY  04/20/2009  ? stricture/GERD  ? ESOPHAGOGASTRODUODENOSCOPY (EGD) WITH PROPOFOL N/A 12/07/2021  ? Procedure: ESOPHAGOGASTRODUODENOSCOPY (EGD) WITH PROPOFOL;  Surgeon: Ladene Artist, MD;  Location: [facility];  Service: Gastroenterology;  Laterality: N/A;  ? EYE SURGERY    ? HERNIA REPAIR    ? age 29-midline  ? INNER EAR SURGERY  08/20/1986  ? went in behind rt ear-blockage  ? NECK SURGERY    ? [facility]  ? POLYPECTOMY    ? SAVORY DILATION N/A 12/07/2021  ? Procedure: SAVORY DILATION;  Surgeon: Ladene Artist, MD;  Location: [facility];  Service: Gastroenterology;  Laterality: N/A;  ? SHOULDER ARTHROSCOPY WITH ROTATOR CUFF REPAIR AND SUBACROMIAL DECOMPRESSION Right 09/29/2012  ? Procedure: SHOULDER ARTHROSCOPY WITH ROTATOR CUFF REPAIR AND SUBACROMIAL DECOMPRESSION;  Surgeon: Nita Sells, MD;  Location: [facility];  Service: Orthopedics;  Laterality: Right;  Right shoulder arthroscopy with subacromial decompression and distal clavicle excision & Debridement of Labrial Tear  ? SHOULDER SURGERY Left   ? x2  ? UPPER GASTROINTESTINAL ENDOSCOPY    ? ? ?FAMILY HISTORY ?Family History  ?Problem Relation Age of Onset  ? Diabetes Mother   ? Colon cancer Father 56  ? Diabetes Other   ? Colon polyps Brother   ? Diabetes Maternal Aunt   ? Cancer Cousin   ?      breast cancer  ? ? ?SOCIAL HISTORY ?Social History  ? ?Tobacco Use  ? Smoking status: Never  ? Smokeless tobacco: Never  ?Vaping Use  ? Vaping Use: Never used  ?Substance Use Topics  ? Alcohol use: Yes  ?  Alcohol/week: 12.0 standard drinks  ?  Types: 12 Cans of beer per week  ? Drug use: No  ? ?  ? ?  ? ?OPHTHALMIC EXAM: ? ?Base Eye Exam   ? ? Visual Acuity (ETDRS)   ? ?   Right Left  ? Dist cc 20/20 20/20 -2  ? ? Correction: Glasses  ? ?  ?  ? ? Tonometry (Tonopen, 10:02 AM)   ? ?   Right Left  ? Pressure 15 10  ? ?  ?  ? ?  Pupils   ? ?   Pupils APD  ? Right PERRL None  ? Left PERRL None  ? ?  ?  ? ? Visual Fields   ? ?   Left Right  ?  Full Full  ? ?  ?  ? ? Extraocular Movement   ? ?   Right Left  ?  Full Full  ? ?  ?  ? ? Neuro/Psych   ? ? Oriented x3: Yes  ? Mood/Affect: Normal  ? ?  ?  ? ? Dilation   ? ? Right eye: 2.5% Phenylephrine, 1.0% Mydriacyl @ 10:02 AM  ? ?  ?  ? ?  ? ?Slit Lamp and Fundus Exam   ? ? External Exam   ? ?   Right Left  ? External Normal Normal  ? ?  ?  ? ? Slit Lamp Exam   ? ?   Right Left  ? Lids/Lashes Normal Normal  ? Conjunctiva/Sclera White and quiet White and quiet  ? Cornea Clear Clear  ? Anterior Chamber Deep and quiet Deep and quiet  ? Iris Round and reactive Round and reactive, visible remnants of iris neovascularization, undilated  ? Lens Posterior chamber intraocular lens,, , 1+ Posterior capsular opacification 2+ Nuclear sclerosis  ? Anterior Vitreous Normal Normal  ? ?  ?  ? ?  ? ? ?IMAGING AND PROCEDURES  ?Imaging and Procedures for 12/26/21 ? ?OCT, Retina - OU - Both Eyes   ? ?   ?Right Eye ?Quality was good. Scan locations included subfoveal. Central Foveal Thickness: 324. Progression has worsened.  ? ?Left Eye ?Quality was good. Scan locations included subfoveal. Central Foveal Thickness: 330. Progression has improved. Findings include cystoid macular edema, abnormal foveal contour.  ? ?Notes ?CSME OD, worsening temporally, this could be a region of hypoxic damage  from untreated sleep apnea which is unconfirmed at this time to have nightly hypoxic events OD, will need treatment today ? ?OS improved today, sooner involved, at 1 weeks post Avastin.,  Will continue to

## 2021-12-26 NOTE — Assessment & Plan Note (Signed)
Temporally, CSME, will treat today to maintain and prevent center involvement as has occurred left eye ?

## 2021-12-26 NOTE — Assessment & Plan Note (Signed)
OS, improved at some 14 days or 2 weeks after most recent injection, needs antivegF therapy ?

## 2021-12-26 NOTE — Assessment & Plan Note (Signed)
Pending evaluation this later this week for possible sleep apnea and/or testing ?

## 2021-12-29 ENCOUNTER — Encounter: Payer: Self-pay | Admitting: Primary Care

## 2021-12-29 ENCOUNTER — Ambulatory Visit (INDEPENDENT_AMBULATORY_CARE_PROVIDER_SITE_OTHER): Payer: 59 | Admitting: Primary Care

## 2021-12-29 VITALS — BP 120/78 | HR 77 | Temp 98.1°F | Ht 68.0 in | Wt 223.0 lb

## 2021-12-29 DIAGNOSIS — R0683 Snoring: Secondary | ICD-10-CM | POA: Diagnosis not present

## 2021-12-29 NOTE — Assessment & Plan Note (Addendum)
Patient has symptoms of loud snoring, restless sleep, waking up gasping for air, daytime sleepiness. Comorbidities include HTN, obesity, type 2 diabetes and diabetic retinopathy. Epworth 2. BMI 33. Concern patient could have obstructive sleep apnea, needs home sleep study to evaluate. Discussed risk of untreated sleep apnea including cardiac arrhythmias, pulm HTN, stroke, DM. We briefly reviewed treatment options. Encouraged patient to work on weight loss efforts and focus on side sleeping position/elevate head of bed. Advised against driving if experiencing excessive daytime sleepiness. Follow-up in 4-6 weeks to review sleep study results and discuss treatment options further. ?

## 2021-12-29 NOTE — Patient Instructions (Addendum)
?Sleep apnea is defined as period of 10 seconds or longer when you stop breathing at night. This can happen multiple times a night. Dx sleep apnea is when this occurs more than 5 times an hour.  ?  ?Mild OSA 5-15 apneic events an hour ?Moderate OSA 15-30 apneic events an hour ?Severe OSA > 30 apneic events an hour ?  ?Untreated sleep apnea puts you at higher risk for cardiac arrhythmias, pulmonary HTN, stroke and diabetes ?  ?Treatment options include weight loss, side sleeping position, oral appliance, CPAP therapy or referral to ENT for possible surgical options  ?  ?Recommendations: ?Focus on side sleeping position ?Work on weight loss efforts  ?Do not drive if experiencing excessive daytime sleepiness of fatigue  ?  ?Orders: ?Home sleep study re: snoring  ?  ?Follow-up: ?Call once you've have completed your sleep study to schedule follow-up visit to review sleep study results and treatment options if needed ? ? ?Sleep Apnea ?Sleep apnea affects breathing during sleep. It causes breathing to stop for 10 seconds or more, or to become shallow. People with sleep apnea usually snore loudly. ?It can also increase the risk of: ?Heart attack. ?Stroke. ?Being very overweight (obese). ?Diabetes. ?Heart failure. ?Irregular heartbeat. ?High blood pressure. ?The goal of treatment is to help you breathe normally again. ?What are the causes? ? ?The most common cause of this condition is a collapsed or blocked airway. ?There are three kinds of sleep apnea: ?Obstructive sleep apnea. This is caused by a blocked or collapsed airway. ?Central sleep apnea. This happens when the brain does not send the right signals to the muscles that control breathing. ?Mixed sleep apnea. This is a combination of obstructive and central sleep apnea. ?What increases the risk? ?Being overweight. ?Smoking. ?Having a small airway. ?Being older. ?Being male. ?Drinking alcohol. ?Taking medicines to calm yourself (sedatives or tranquilizers). ?Having  family members with the condition. ?Having a tongue or tonsils that are larger than normal. ?What are the signs or symptoms? ?Trouble staying asleep. ?Loud snoring. ?Headaches in the morning. ?Waking up gasping. ?Dry mouth or sore throat in the morning. ?Being sleepy or tired during the day. ?If you are sleepy or tired during the day, you may also: ?Not be able to focus your mind (concentrate). ?Forget things. ?Get angry a lot and have mood swings. ?Feel sad (depressed). ?Have changes in your personality. ?Have less interest in sex, if you are male. ?Be unable to have an erection, if you are male. ?How is this treated? ? ?Sleeping on your side. ?Using a medicine to get rid of mucus in your nose (decongestant). ?Avoiding the use of alcohol, medicines to help you relax, or certain pain medicines (narcotics). ?Losing weight, if needed. ?Changing your diet. ?Quitting smoking. ?Using a machine to open your airway while you sleep, such as: ?An oral appliance. This is a mouthpiece that shifts your lower jaw forward. ?A CPAP device. This device blows air through a mask when you breathe out (exhale). ?An EPAP device. This has valves that you put in each nostril. ?A BIPAP device. This device blows air through a mask when you breathe in (inhale) and breathe out. ?Having surgery if other treatments do not work. ?Follow these instructions at home: ?Lifestyle ?Make changes that your doctor recommends. ?Eat a healthy diet. ?Lose weight if needed. ?Avoid alcohol, medicines to help you relax, and some pain medicines. ?Do not smoke or use any products that contain nicotine or tobacco. If you need help quitting, ask  your doctor. ?General instructions ?Take over-the-counter and prescription medicines only as told by your doctor. ?If you were given a machine to use while you sleep, use it only as told by your doctor. ?If you are having surgery, make sure to tell your doctor you have sleep apnea. You may need to bring your device with  you. ?Keep all follow-up visits. ?Contact a doctor if: ?The machine that you were given to use during sleep bothers you or does not seem to be working. ?You do not get better. ?You get worse. ?Get help right away if: ?Your chest hurts. ?You have trouble breathing in enough air. ?You have an uncomfortable feeling in your back, arms, or stomach. ?You have trouble talking. ?One side of your body feels weak. ?A part of your face is hanging down. ?These symptoms may be an emergency. Get help right away. Call your local emergency services (911 in the [place] ?Do not wait to see if the symptoms will go away. ?Do not drive yourself to the hospital. ?Summary ?This condition affects breathing during sleep. ?The most common cause is a collapsed or blocked airway. ?The goal of treatment is to help you breathe normally while you sleep. ?This information is not intended to replace advice given to you by your health care provider. Make sure you discuss any questions you have with your health care provider. ?Document Revised: 03/15/2021 Document Reviewed: 07/15/2020 ?Elsevier Patient Education ? [employer]. ? ?

## 2021-12-29 NOTE — Progress Notes (Signed)
? ?'@Patient'$  ID: Mitchell Palmer, male    DOB: 01/22/1966, 56 y.o.   MRN: [id number] ? ?Chief Complaint  ?Patient presents with  ? sleep consult  ?  Pt states he has been told that he snores at night. States sometimes he feels rested when he wakes up but sometimes he feels like he has not slept at all.  ? ? ?Referring provider: ?Tower, Wynelle Fanny, MD ? ?HPI: ?56 year old male, never smoked.  Past medical history significant for hypertension, allergic rhinitis, GERD, type 2 diabetes, obesity. ? ?12/29/2021 ?Patient presents today for sleep consult. He was referred by Dr. Deloria Lair for possible sleep apnea d/t diabetic retinopathy He has symptoms of restless sleep, loud snoring, wakes up gasping for air and daytime fatigue. 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 Typical bedtime is either 7:30am or 9pm. It can take him 30-60 mins to fall asleep. He wakes up 3-4 times a night. He starts his da at either 4:30pm and 9am. No previous sleep study. He is not on CPAP or oxygen. Epworh 2. Denies narcolepsy, cataplexy or sleep walking.  ? 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 ?Allergies  ?Allergen Reactions  ? Codeine   ?  Anxious  ? Lipitor [Atorvastatin]   ?  Muscle pain   ? ? ?Immunization History  ?Administered Date(s) Administered  ? Hep A / Hep B 03/26/2018, 04/28/2018, 09/30/2018  ? Influenza Split 06/17/2012  ? Influenza Whole 06/20/2010  ? Influenza,inj,Quad PF,6+ Mos 06/03/2013, 06/30/2014, 07/22/2015, 05/15/2016, 05/10/2017, 04/28/2018, 05/28/2019, 05/31/2020, 05/24/2021  ? PFIZER(Purple Top)SARS-COV-2 Vaccination 03/16/2020,  04/08/2020  ? Pneumococcal Polysaccharide-23 10/12/2011, 02/27/2017  ? Td 08/21/1999, 09/14/2020  ? Tdap 01/12/2011  ? ? ?Past Medical History:  ?Diagnosis Date  ? Allergy   ? allegic rhinitis  ? Diabetes mellitus   ? type II  ? Esophageal stricture   ? GERD (gastroesophageal reflux disease)   ? History of hernia repair   ? as a baby  ? Hyperlipidemia   ? Hyperplastic polyp of intestine 2010  ? Hypertension   ? Proliferative diabetic retinopathy of left eye ([place]) 03/31/2020  ? This is active as a patient still has tufts of the anterior segment neovascularization, rubeosis in the left eye every region of capillary nonperfusion should be treated with PRP.  ? S/P ear surgery, follow-up exam   ? for drainage  ? Vitreous hemorrhage of right eye ([place]) 12/24/2019  ? ? ?Tobacco History: ?Social History  ? ?Tobacco Use  ?Smoking Status Never  ?Smokeless Tobacco Never  ? ?Counseling given: Not Answered ? ? ?Outpatient Medications Prior to Visit  ?Medication Sig Dispense Refill  ? B-D UF III MINI PEN NEEDLES 31G X 5 MM MISC USE AS DIRECTED 4 TIMES A DAY 400 each 3  ? cetirizine (ZYRTEC) 10 MG tablet Take 10 mg by mouth daily.    ? Cholecalciferol (VITAMIN D) 50 MCG (2000 UT) tablet Take 2,000 Units by mouth daily.    ? Continuous Blood Gluc Sensor (FREESTYLE LIBRE 3 SENSOR) MISC 1 applicator by Does not apply route every 14 (fourteen) days. 6 each  3  ? cyclobenzaprine (FLEXERIL) 10 MG tablet Take 1 tablet (10 mg total) by mouth 3 (three) times daily as needed. 30 tablet 3  ? empagliflozin (JARDIANCE) 25 MG TABS tablet Take 1 tablet (25 mg total) by mouth daily before breakfast. 90 tablet 3  ? fluticasone (FLONASE) 50 MCG/ACT nasal spray Place 2 sprays into both nostrils daily as needed for allergies or rhinitis. 16 mL 3  ? insulin lispro (HUMALOG KWIKPEN) 100 UNIT/ML KwikPen INJECT 15-25 UNITS INTO THE SKIN 3 TIMES DAILY W/ MEALS AND 10-12 UNITS INTO THE SKIN W/ SNACKS (Patient taking differently: Inject 10-30 Units into the skin  3 (three) times daily. INJECT 25-30 UNITS INTO THE SKIN 3 TIMES DAILY W/ MEALS AND 10-12 UNITS INTO THE SKIN W/ SNACKS) 60 mL 3  ? LANTUS SOLOSTAR 100 UNIT/ML Solostar Pen INJECT 42 UNITS IN THE MORNING AND 50 UNITS IN THE EVENING 60 mL 3  ? levothyroxine (SYNTHROID) 75 MCG tablet TAKE 1 TABLET BY MOUTH ONCE A DAY ON AN EMPTY STOMACH 30 MINUTES BEFORE EATING. 30 tablet 1  ? metFORMIN (GLUCOPHAGE) 1000 MG tablet Take 1 tablet (1,000 mg total) by mouth 2 (two) times daily with a meal. 60 tablet 11  ? olmesartan (BENICAR) 40 MG tablet TAKE 1 TABLET BY MOUTH EVERY DAY 90 tablet 2  ? omeprazole (PRILOSEC) 40 MG capsule TAKE 1 CAPSULE (40 MG TOTAL) BY MOUTH DAILY. 90 capsule 1  ? ONETOUCH ULTRA test strip USE TO TEST BLOOD SUGAR 4 TIMES DAILY AS DIRECTED 400 strip 11  ? rosuvastatin (CRESTOR) 10 MG tablet Take 1 tablet (10 mg total) by mouth daily. 30 tablet 11  ? tadalafil (CIALIS) 20 MG tablet Take 1 tablet (20 mg total) by mouth as directed. (Patient taking differently: Take 20 mg by mouth daily as needed for erectile dysfunction.) 4 tablet 5  ? tirzepatide (MOUNJARO) 10 MG/0.5ML Pen Inject 10 mg into the skin once a week. 2 mL 3  ? vitamin B-12 (CYANOCOBALAMIN) 1000 MCG tablet Take 1,000 mcg by mouth daily.    ? ?No facility-administered medications prior to visit.  ? ?Review of Systems ? ?Review of Systems  ?Constitutional:  Positive for fatigue.  ?HENT: Negative.    ?Eyes:  Positive for visual disturbance.  ?Respiratory: Negative.    ?Cardiovascular: Negative.   ?Psychiatric/Behavioral:  Positive for sleep disturbance.   ? ? ?Physical Exam ? ?BP 120/78 (BP Location: Left Arm, Patient Position: Sitting, Cuff Size: Normal)   Pulse 77   Temp 98.1 ?F (36.7 ?C) (Oral)   Ht '5\' 8"'$  (1.727 m)   Wt 223 lb (101.2 kg)   SpO2 97% Comment: RA  BMI 33.91 kg/m?  ?Physical Exam ?Constitutional:   ?   General: He is not in acute distress. ?   Appearance: Normal appearance. He is not ill-appearing.  ?HENT:  ?   Head:  Normocephalic and atraumatic.  ?   Mouth/Throat:  ?   Mouth: Mucous membranes are moist.  ?   Pharynx: Oropharynx is clear.  ?   Comments: Mallampati class II ?Cardiovascular:  ?   Rate and Rhythm: Normal rate and regular rhythm.  ?Pulmonary:  ?   Effort: Pulmonary effort is normal.  ?   Breath sounds: Normal breath sounds.  ?Musculoskeletal:     ?   General: Normal range of motion.  ?Skin: ?   General: Skin is warm and dry.  ?Neurological:  ?   General: No focal deficit present.  ?   Mental Status:  He is alert and oriented to person, place, and time. Mental status is at baseline.  ?Psychiatric:     ?   Mood and Affect: Mood normal.     ?   Behavior: Behavior normal.     ?   Thought Content: Thought content normal.     ?   Judgment: Judgment normal.  ?  ? ?Lab Results: ? ?CBC ?   ?Component Value Date/Time  ? WBC 7.4 10/27/2021 0745  ? RBC 4.63 10/27/2021 0745  ? HGB 16.2 10/27/2021 0745  ? HCT 45.7 10/27/2021 0745  ? PLT 244.0 10/27/2021 0745  ? MCV 98.9 10/27/2021 0745  ? MCHC 35.4 10/27/2021 0745  ? RDW 12.7 10/27/2021 0745  ? LYMPHSABS 1.3 10/27/2021 0745  ? MONOABS 0.6 10/27/2021 0745  ? EOSABS 0.3 10/27/2021 0745  ? BASOSABS 0.0 10/27/2021 0745  ? ? ?BMET ?   ?Component Value Date/Time  ? NA 140 10/27/2021 0745  ? K 5.0 10/27/2021 0745  ? CL 101 10/27/2021 0745  ? CO2 29 10/27/2021 0745  ? GLUCOSE 178 (H) 10/27/2021 0745  ? BUN 14 10/27/2021 0745  ? CREATININE 1.12 10/27/2021 0745  ? CALCIUM 9.3 10/27/2021 0745  ? GFRNONAA >90 09/23/2012 1200  ? GFRAA >90 09/23/2012 1200  ? ? ?BNP ?No results found for: BNP ? ?ProBNP ?No results found for: PROBNP ? ?Imaging: ?Intravitreal Injection, Pharmacologic Agent - OD - Right Eye ? ?Result Date: 12/26/2021 ?Time Out 12/26/2021. 11:01 AM. Confirmed correct patient, procedure, site, and patient consented. Anesthesia Topical anesthesia was used. Anesthetic medications included Lidocaine 4%. Procedure Preparation included 5% betadine to ocular surface, 10% betadine to eyelids. A  30 gauge needle was used. Injection: 2.5 mg bevacizumab 2.5 MG/0.1ML   Route: Intravitreal, Site: Right Eye   NDC: [id number], Lot: [id number] a Post-op Post injection exam found visual acuity of at least counting fing

## 2021-12-29 NOTE — Progress Notes (Signed)
Reviewed and agree with assessment/plan. ? ? ?Chesley Mires, MD ?[place] ?12/29/2021, 3:10 PM ?Pager:  [phone] ? ?

## 2022-01-02 ENCOUNTER — Other Ambulatory Visit: Payer: Self-pay | Admitting: Family Medicine

## 2022-01-22 ENCOUNTER — Ambulatory Visit (INDEPENDENT_AMBULATORY_CARE_PROVIDER_SITE_OTHER): Payer: 59 | Admitting: Ophthalmology

## 2022-01-22 ENCOUNTER — Encounter (INDEPENDENT_AMBULATORY_CARE_PROVIDER_SITE_OTHER): Payer: Self-pay | Admitting: Ophthalmology

## 2022-01-22 DIAGNOSIS — E113512 Type 2 diabetes mellitus with proliferative diabetic retinopathy with macular edema, left eye: Secondary | ICD-10-CM

## 2022-01-22 DIAGNOSIS — R0683 Snoring: Secondary | ICD-10-CM

## 2022-01-22 DIAGNOSIS — E113511 Type 2 diabetes mellitus with proliferative diabetic retinopathy with macular edema, right eye: Secondary | ICD-10-CM | POA: Diagnosis not present

## 2022-01-22 MED ORDER — AFLIBERCEPT 2MG/0.05ML IZ SOLN FOR KALEIDOSCOPE
2.0000 mg | INTRAVITREAL | Status: AC | PRN
  Administered 2022-01-22: 2 mg via INTRAVITREAL

## 2022-01-22 NOTE — Progress Notes (Signed)
01/22/2022     CHIEF COMPLAINT Patient presents for  Chief Complaint  Patient presents with   Retina Follow Up      HISTORY OF PRESENT ILLNESS: Mitchell Palmer is a 56 y.o. male who presents to the clinic today for:   HPI     Retina Follow Up           Diagnosis: Diabetic Retinopathy   Laterality: left eye   Onset: 6 weeks ago         Comments   6 weeks Eylea OS, OCT. Patient reports "Clarity change just a little bit, it is a little bit. Just is time for a shot." LBS: Patient reports 118 before eating, this morning.      Last edited by Laurin Coder on 01/22/2022  9:06 AM.      Referring physician: Tower, Wynelle Fanny, MD [place],  [place] [address]  HISTORICAL INFORMATION:   Selected notes from the MEDICAL RECORD NUMBER    Lab Results  Component Value Date   HGBA1C 8.3 (A) 11/06/2021     CURRENT MEDICATIONS: No current outpatient medications on file. (Ophthalmic Drugs)   No current facility-administered medications for this visit. (Ophthalmic Drugs)   Current Outpatient Medications (Other)  Medication Sig   B-D UF III MINI PEN NEEDLES 31G X 5 MM MISC USE AS DIRECTED 4 TIMES A DAY   cetirizine (ZYRTEC) 10 MG tablet Take 10 mg by mouth daily.   Cholecalciferol (VITAMIN D) 50 MCG (2000 UT) tablet Take 2,000 Units by mouth daily.   Continuous Blood Gluc Sensor (FREESTYLE LIBRE 3 SENSOR) MISC 1 applicator by Does not apply route every 14 (fourteen) days.   cyclobenzaprine (FLEXERIL) 10 MG tablet Take 1 tablet (10 mg total) by mouth 3 (three) times daily as needed.   empagliflozin (JARDIANCE) 25 MG TABS tablet Take 1 tablet (25 mg total) by mouth daily before breakfast.   fluticasone (FLONASE) 50 MCG/ACT nasal spray Place 2 sprays into both nostrils daily as needed for allergies or rhinitis.   insulin lispro (HUMALOG KWIKPEN) 100 UNIT/ML KwikPen INJECT 15-25 UNITS INTO THE SKIN 3 TIMES DAILY W/ MEALS AND 10-12 UNITS INTO THE SKIN W/ SNACKS  (Patient taking differently: Inject 10-30 Units into the skin 3 (three) times daily. INJECT 25-30 UNITS INTO THE SKIN 3 TIMES DAILY W/ MEALS AND 10-12 UNITS INTO THE SKIN W/ SNACKS)   LANTUS SOLOSTAR 100 UNIT/ML Solostar Pen INJECT 42 UNITS IN THE MORNING AND 50 UNITS IN THE EVENING   levothyroxine (SYNTHROID) 75 MCG tablet TAKE 1 TABLET BY MOUTH ONCE A DAY ON AN EMPTY STOMACH 30 MINUTES BEFORE EATING.   metFORMIN (GLUCOPHAGE) 1000 MG tablet Take 1 tablet (1,000 mg total) by mouth 2 (two) times daily with a meal.   olmesartan (BENICAR) 40 MG tablet TAKE 1 TABLET BY MOUTH EVERY DAY   omeprazole (PRILOSEC) 40 MG capsule TAKE 1 CAPSULE (40 MG TOTAL) BY MOUTH DAILY.   ONETOUCH ULTRA test strip USE TO TEST BLOOD SUGAR 4 TIMES DAILY AS DIRECTED   rosuvastatin (CRESTOR) 10 MG tablet Take 1 tablet (10 mg total) by mouth daily.   tadalafil (CIALIS) 20 MG tablet Take 1 tablet (20 mg total) by mouth as directed. (Patient taking differently: Take 20 mg by mouth daily as needed for erectile dysfunction.)   tirzepatide (MOUNJARO) 10 MG/0.5ML Pen Inject 10 mg into the skin once a week.   vitamin B-12 (CYANOCOBALAMIN) 1000 MCG tablet Take 1,000 mcg by  mouth daily.   No current facility-administered medications for this visit. (Other)      REVIEW OF SYSTEMS:    ALLERGIES Allergies  Allergen Reactions   Codeine     Anxious   Lipitor [Atorvastatin]     Muscle pain     PAST MEDICAL HISTORY Past Medical History:  Diagnosis Date   Allergy    allegic rhinitis   Diabetes mellitus    type II   Esophageal stricture    GERD (gastroesophageal reflux disease)    History of hernia repair    as a baby   Hyperlipidemia    Hyperplastic polyp of intestine 2010   Hypertension    Proliferative diabetic retinopathy of left eye ([place]) 03/31/2020   This is active as a patient still has tufts of the anterior segment neovascularization, rubeosis in the left eye every region of capillary nonperfusion should be  treated with PRP.   S/P ear surgery, follow-up exam    for drainage   Vitreous hemorrhage of right eye ([place]) 12/24/2019   Past Surgical History:  Procedure Laterality Date   BIOPSY  12/07/2021   Procedure: BIOPSY;  Surgeon: Ladene Artist, MD;  Location: [facility];  Service: Gastroenterology;;   CATARACT EXTRACTION     COLONOSCOPY  03/07/2016   COLONOSCOPY WITH PROPOFOL N/A 12/07/2021   Procedure: COLONOSCOPY WITH PROPOFOL;  Surgeon: Ladene Artist, MD;  Location: [facility];  Service: Gastroenterology;  Laterality: N/A;   ESOPHAGOGASTRODUODENOSCOPY  04/20/2009   stricture/GERD   ESOPHAGOGASTRODUODENOSCOPY (EGD) WITH PROPOFOL N/A 12/07/2021   Procedure: ESOPHAGOGASTRODUODENOSCOPY (EGD) WITH PROPOFOL;  Surgeon: Ladene Artist, MD;  Location: [facility];  Service: Gastroenterology;  Laterality: N/A;   EYE SURGERY     HERNIA REPAIR     age 13-midline   INNER EAR SURGERY  08/20/1986   went in behind [facility]   POLYPECTOMY     SAVORY DILATION N/A 12/07/2021   Procedure: SAVORY DILATION;  Surgeon: Ladene Artist, MD;  Location: [facility];  Service: Gastroenterology;  Laterality: N/A;   SHOULDER ARTHROSCOPY WITH ROTATOR CUFF REPAIR AND SUBACROMIAL DECOMPRESSION Right 09/29/2012   Procedure: SHOULDER ARTHROSCOPY WITH ROTATOR CUFF REPAIR AND SUBACROMIAL DECOMPRESSION;  Surgeon: Nita Sells, MD;  Location: [facility];  Service: Orthopedics;  Laterality: Right;  Right shoulder arthroscopy with subacromial decompression and distal clavicle excision & Debridement of Labrial Tear   SHOULDER SURGERY Left    x2   UPPER GASTROINTESTINAL ENDOSCOPY      FAMILY HISTORY Family History  Problem Relation Age of Onset   Diabetes Mother    Colon cancer Father 95   Diabetes Other    Colon polyps Brother    Diabetes Maternal Aunt    Cancer Cousin        breast cancer    SOCIAL HISTORY Social History   Tobacco Use    Smoking status: Never   Smokeless tobacco: Never  Vaping Use   Vaping Use: Never used  Substance Use Topics   Alcohol use: Yes    Alcohol/week: 12.0 standard drinks    Types: 12 Cans of beer per week   Drug use: No         OPHTHALMIC EXAM:  Base Eye Exam     Visual Acuity (ETDRS)       Right Left   Dist cc 20/20 -1 20/20 -1    Correction: Glasses  Tonometry (Tonopen, 9:06 AM)       Right Left   Pressure 17 15         Pupils       Pupils Dark Light APD   Right PERRL 3 2 None   Left PERRL 3 2 None         Extraocular Movement       Right Left    Full Full         Neuro/Psych     Oriented x3: Yes   Mood/Affect: Normal         Dilation     Left eye: 1.0% Mydriacyl, 2.5% Phenylephrine @ 9:06 AM           Slit Lamp and Fundus Exam     External Exam       Right Left   External Normal Normal         Slit Lamp Exam       Right Left   Lids/Lashes Normal Normal   Conjunctiva/Sclera White and quiet White and quiet   Cornea Clear Clear   Anterior Chamber Deep and quiet Deep and quiet   Iris Round and reactive Round and reactive, visible remnants of iris neovascularization, undilated   Lens Posterior chamber intraocular lens,, , 1+ Posterior capsular opacification 2+ Nuclear sclerosis   Anterior Vitreous Normal Normal         Fundus Exam       Right Left   Posterior Vitreous  no posterior vitreous detachment   Disc  Normal   C/D Ratio  0.2   Macula  Microaneurysms, Macular thickening, Mild clinically significant macular edema   Vessels  Proliferative diabetic retinopathy, with good PRP yet there are a temporal window anterior to the equator in the left eye in addition to the region temporal to the macula in the left eye with large regions of capillary nonperfusion which might be triggering the anterior segment neovascularization   Periphery  Good PRP and attached, some room anterior in the temporal retina for more PRP if  disease reactivates, small NVE superior            IMAGING AND PROCEDURES  Imaging and Procedures for 01/22/22  Intravitreal Injection, Pharmacologic Agent - OS - Left Eye       Time Out 01/22/2022. 10:05 AM. Confirmed correct patient, procedure, site, and patient consented.   Anesthesia Topical anesthesia was used. Anesthetic medications included Lidocaine 4%.   Procedure Preparation included 5% betadine to ocular surface, 10% betadine to eyelids, Tobramycin 0.3%. A 30 gauge needle was used.   Injection: 2 mg aflibercept 2 MG/0.05ML   Route: Intravitreal, Site: Left Eye   NDC: [id number], Lot: [id number], Waste: 0 mL   Post-op Post injection exam found visual acuity of at least counting fingers. The patient tolerated the procedure well. There were no complications. The patient received written and verbal post procedure care education. Post injection medications were not given.      OCT, Retina - OU - Both Eyes       Right Eye Quality was good. Scan locations included subfoveal. Central Foveal Thickness: 357. Progression has worsened. Findings include cystoid macular edema.   Left Eye Quality was good. Scan locations included subfoveal. Central Foveal Thickness: 476. Progression has worsened. Findings include cystoid macular edema, abnormal foveal contour.   Notes CSME OD, worsening temporally, this could be a region of hypoxic damage from untreated sleep apnea which is unconfirmed at this time to have nightly hypoxic  events OD, will need treatment today, at 3 weeks and 6 days post most recent injection.  OS, increased fluid today at 5 weeks weeks post Avastin.,  We will repeat injection today OS will change to [facility]             ASSESSMENT/PLAN:  Loud snoring Still 4 to 6-week weeks out on testing for sleep apnea.  Highly likely in this patient with positive review of system for sleep apnea, and if found to be present, sleep apnea should be treated  aggressively to minimize nightly hypoxic damage stress to the macula  Proliferative diabetic retinopathy of left eye with macular edema associated with type 2 diabetes mellitus ([place]) No improvement post resumption of therapy for CSME post Avastin.  Resistant to Avastin.  We will commence therapy today with Eylea for now center involved CSME  Diabetic macular edema of right eye with proliferative retinopathy associated with type 2 diabetes mellitus ([place]) At nearly 4 weeks post most recent injection Avastin OD, with worsening of findings.  Follow-up as scheduled     ICD-10-CM   1. Proliferative diabetic retinopathy of left eye with macular edema associated with type 2 diabetes mellitus (HCC)  O75.6433 Intravitreal Injection, Pharmacologic Agent - OS - Left Eye    OCT, Retina - OU - Both Eyes    aflibercept (EYLEA) SOLN 2 mg    2. Loud snoring  R06.83     3. Diabetic macular edema of right eye with proliferative retinopathy associated with type 2 diabetes mellitus (HCC)  E11.3511       1.  OS, with persistent and worsening of CSME despite treatment with intravitreal Avastin some 5 weeks previous.  We will need to change to intravitreal Eylea today for center involved CSME best chance to halt visual acuity impact  2.  OD, post injection Avastin as well, follow-up as scheduled OD  3.  Patient awaiting sleep apnea testing home study, awaiting for the next 4 to 6 weeks  Ophthalmic Meds Ordered this visit:  Meds ordered this encounter  Medications   aflibercept (EYLEA) SOLN 2 mg       Return in about 5 weeks (around 02/26/2022) for dilate, OS, EYLEA OCT,,, follow up dilate OD as scheduled.  There are no Patient Instructions on file for this visit.   Explained the diagnoses, plan, and follow up with the patient and they expressed understanding.  Patient expressed understanding of the importance of proper follow up care.   Clent Demark Paulita Licklider M.D. Diseases & Surgery of the Retina and  Vitreous Retina & Diabetic [place] 01/22/22     Abbreviations: M myopia (nearsighted); A astigmatism; H hyperopia (farsighted); P presbyopia; Mrx spectacle prescription;  CTL contact lenses; OD right eye; OS left eye; OU both eyes  XT exotropia; ET esotropia; PEK punctate epithelial keratitis; PEE punctate epithelial erosions; DES dry eye syndrome; MGD meibomian gland dysfunction; ATs artificial tears; PFAT's preservative free artificial tears; [place] nuclear sclerotic cataract; PSC posterior subcapsular cataract; ERM epi-retinal membrane; PVD posterior vitreous detachment; RD retinal detachment; DM diabetes mellitus; DR diabetic retinopathy; NPDR non-proliferative diabetic retinopathy; PDR proliferative diabetic retinopathy; CSME clinically significant macular edema; DME diabetic macular edema; dbh dot blot hemorrhages; CWS cotton wool spot; POAG primary open angle glaucoma; C/D cup-to-disc ratio; HVF humphrey visual field; GVF goldmann visual field; OCT optical coherence tomography; IOP intraocular pressure; BRVO Branch retinal vein occlusion; CRVO central retinal vein occlusion; CRAO central retinal artery occlusion; BRAO branch retinal artery occlusion; RT retinal tear; SB  scleral buckle; PPV pars plana vitrectomy; VH Vitreous hemorrhage; PRP panretinal laser photocoagulation; IVK intravitreal kenalog; VMT vitreomacular traction; MH Macular hole;  NVD neovascularization of the disc; NVE neovascularization elsewhere; AREDS age related eye disease study; ARMD age related macular degeneration; POAG primary open angle glaucoma; EBMD epithelial/anterior basement membrane dystrophy; ACIOL anterior chamber intraocular lens; IOL intraocular lens; PCIOL posterior chamber intraocular lens; Phaco/IOL phacoemulsification with intraocular lens placement; [place] photorefractive keratectomy; LASIK laser assisted in situ keratomileusis; HTN hypertension; DM diabetes mellitus; COPD chronic obstructive pulmonary disease

## 2022-01-22 NOTE — Assessment & Plan Note (Signed)
At nearly 4 weeks post most recent injection Avastin OD, with worsening of findings.  Follow-up as scheduled

## 2022-01-22 NOTE — Assessment & Plan Note (Addendum)
Still 4 to 6-week weeks out on testing for sleep apnea.  Highly likely in this patient with positive review of system for sleep apnea, and if found to be present, sleep apnea should be treated aggressively to minimize nightly hypoxic damage stress to the macula

## 2022-01-22 NOTE — Assessment & Plan Note (Signed)
No improvement post resumption of therapy for CSME post Avastin.  Resistant to Avastin.  We will commence therapy today with Eylea for now center involved CSME

## 2022-02-18 ENCOUNTER — Other Ambulatory Visit: Payer: Self-pay | Admitting: Internal Medicine

## 2022-02-18 DIAGNOSIS — E1122 Type 2 diabetes mellitus with diabetic chronic kidney disease: Secondary | ICD-10-CM

## 2022-02-21 ENCOUNTER — Ambulatory Visit (INDEPENDENT_AMBULATORY_CARE_PROVIDER_SITE_OTHER): Payer: 59 | Admitting: Ophthalmology

## 2022-02-21 DIAGNOSIS — E113511 Type 2 diabetes mellitus with proliferative diabetic retinopathy with macular edema, right eye: Secondary | ICD-10-CM

## 2022-02-21 DIAGNOSIS — E113512 Type 2 diabetes mellitus with proliferative diabetic retinopathy with macular edema, left eye: Secondary | ICD-10-CM | POA: Diagnosis not present

## 2022-02-21 MED ORDER — BEVACIZUMAB 2.5 MG/0.1ML IZ SOSY
2.5000 mg | PREFILLED_SYRINGE | INTRAVITREAL | Status: AC | PRN
  Administered 2022-02-21: 2.5 mg via INTRAVITREAL

## 2022-02-21 NOTE — Progress Notes (Signed)
02/21/2022     CHIEF COMPLAINT Patient presents for  Chief Complaint  Patient presents with   Diabetic Retinopathy with Macular Edema      HISTORY OF PRESENT ILLNESS: Mitchell Palmer is a 56 y.o. male who presents to the clinic today for:   HPI   8 weeks, for dilate OD, avastin OCT Pt states his vision has been stable  Pt denies an new floaters or FOL.  Last edited by Morene Rankins, CMA on 02/21/2022 10:04 AM.      Referring physician: Tower, Wynelle Fanny, MD [place],  [place] [id number]  HISTORICAL INFORMATION:   Selected notes from the MEDICAL RECORD NUMBER    Lab Results  Component Value Date   HGBA1C 8.3 (A) 11/06/2021     CURRENT MEDICATIONS: No current outpatient medications on file. (Ophthalmic Drugs)   No current facility-administered medications for this visit. (Ophthalmic Drugs)   Current Outpatient Medications (Other)  Medication Sig   B-D UF III MINI PEN NEEDLES 31G X 5 MM MISC USE AS DIRECTED 4 TIMES A DAY   cetirizine (ZYRTEC) 10 MG tablet Take 10 mg by mouth daily.   Cholecalciferol (VITAMIN D) 50 MCG (2000 UT) tablet Take 2,000 Units by mouth daily.   Continuous Blood Gluc Sensor (FREESTYLE LIBRE 3 SENSOR) MISC 1 applicator by Does not apply route every 14 (fourteen) days.   cyclobenzaprine (FLEXERIL) 10 MG tablet Take 1 tablet (10 mg total) by mouth 3 (three) times daily as needed.   fluticasone (FLONASE) 50 MCG/ACT nasal spray Place 2 sprays into both nostrils daily as needed for allergies or rhinitis.   insulin lispro (HUMALOG KWIKPEN) 100 UNIT/ML KwikPen INJECT 15-25 UNITS INTO THE SKIN 3 TIMES DAILY W/ MEALS AND 10-12 UNITS INTO THE SKIN W/ SNACKS (Patient taking differently: Inject 10-30 Units into the skin 3 (three) times daily. INJECT 25-30 UNITS INTO THE SKIN 3 TIMES DAILY W/ MEALS AND 10-12 UNITS INTO THE SKIN W/ SNACKS)   JARDIANCE 25 MG TABS tablet TAKE 1 TABLET BY MOUTH DAILY BEFORE BREAKFAST.   LANTUS SOLOSTAR 100 UNIT/ML  Solostar Pen INJECT 42 UNITS IN THE MORNING AND 50 UNITS IN THE EVENING   levothyroxine (SYNTHROID) 75 MCG tablet TAKE 1 TABLET BY MOUTH ONCE A DAY ON AN EMPTY STOMACH 30 MINUTES BEFORE EATING.   metFORMIN (GLUCOPHAGE) 1000 MG tablet Take 1 tablet (1,000 mg total) by mouth 2 (two) times daily with a meal.   olmesartan (BENICAR) 40 MG tablet TAKE 1 TABLET BY MOUTH EVERY DAY   omeprazole (PRILOSEC) 40 MG capsule TAKE 1 CAPSULE (40 MG TOTAL) BY MOUTH DAILY.   ONETOUCH ULTRA test strip USE TO TEST BLOOD SUGAR 4 TIMES DAILY AS DIRECTED   rosuvastatin (CRESTOR) 10 MG tablet Take 1 tablet (10 mg total) by mouth daily.   tadalafil (CIALIS) 20 MG tablet Take 1 tablet (20 mg total) by mouth as directed. (Patient taking differently: Take 20 mg by mouth daily as needed for erectile dysfunction.)   tirzepatide (MOUNJARO) 10 MG/0.5ML Pen Inject 10 mg into the skin once a week.   vitamin B-12 (CYANOCOBALAMIN) 1000 MCG tablet Take 1,000 mcg by mouth daily.   No current facility-administered medications for this visit. (Other)      REVIEW OF SYSTEMS: ROS   Negative for: Constitutional, Gastrointestinal, Neurological, Skin, Genitourinary, Musculoskeletal, HENT, Endocrine, Cardiovascular, Eyes, Respiratory, Psychiatric, Allergic/Imm, Heme/Lymph Last edited by Orene Desanctis D, CMA on 02/21/2022 10:04 AM.  ALLERGIES Allergies  Allergen Reactions   Codeine     Anxious   Lipitor [Atorvastatin]     Muscle pain     PAST MEDICAL HISTORY Past Medical History:  Diagnosis Date   Allergy    allegic rhinitis   Diabetes mellitus    type II   Esophageal stricture    GERD (gastroesophageal reflux disease)    History of hernia repair    as a baby   Hyperlipidemia    Hyperplastic polyp of intestine 2010   Hypertension    Proliferative diabetic retinopathy of left eye ([place]) 03/31/2020   This is active as a patient still has tufts of the anterior segment neovascularization, rubeosis in the left eye  every region of capillary nonperfusion should be treated with PRP.   S/P ear surgery, follow-up exam    for drainage   Vitreous hemorrhage of right eye ([place]) 12/24/2019   Past Surgical History:  Procedure Laterality Date   BIOPSY  12/07/2021   Procedure: BIOPSY;  Surgeon: Ladene Artist, MD;  Location: [facility];  Service: Gastroenterology;;   CATARACT EXTRACTION     COLONOSCOPY  03/07/2016   COLONOSCOPY WITH PROPOFOL N/A 12/07/2021   Procedure: COLONOSCOPY WITH PROPOFOL;  Surgeon: Ladene Artist, MD;  Location: [facility];  Service: Gastroenterology;  Laterality: N/A;   ESOPHAGOGASTRODUODENOSCOPY  04/20/2009   stricture/GERD   ESOPHAGOGASTRODUODENOSCOPY (EGD) WITH PROPOFOL N/A 12/07/2021   Procedure: ESOPHAGOGASTRODUODENOSCOPY (EGD) WITH PROPOFOL;  Surgeon: Ladene Artist, MD;  Location: [facility];  Service: Gastroenterology;  Laterality: N/A;   EYE SURGERY     HERNIA REPAIR     age 73-midline   INNER EAR SURGERY  08/20/1986   went in behind [facility]   POLYPECTOMY     SAVORY DILATION N/A 12/07/2021   Procedure: SAVORY DILATION;  Surgeon: Ladene Artist, MD;  Location: [facility];  Service: Gastroenterology;  Laterality: N/A;   SHOULDER ARTHROSCOPY WITH ROTATOR CUFF REPAIR AND SUBACROMIAL DECOMPRESSION Right 09/29/2012   Procedure: SHOULDER ARTHROSCOPY WITH ROTATOR CUFF REPAIR AND SUBACROMIAL DECOMPRESSION;  Surgeon: Nita Sells, MD;  Location: [facility];  Service: Orthopedics;  Laterality: Right;  Right shoulder arthroscopy with subacromial decompression and distal clavicle excision & Debridement of Labrial Tear   SHOULDER SURGERY Left    x2   UPPER GASTROINTESTINAL ENDOSCOPY      FAMILY HISTORY Family History  Problem Relation Age of Onset   Diabetes Mother    Colon cancer Father 23   Diabetes Other    Colon polyps Brother    Diabetes Maternal Aunt    Cancer Cousin        breast cancer     SOCIAL HISTORY Social History   Tobacco Use   Smoking status: Never   Smokeless tobacco: Never  Vaping Use   Vaping Use: Never used  Substance Use Topics   Alcohol use: Yes    Alcohol/week: 12.0 standard drinks of alcohol    Types: 12 Cans of beer per week   Drug use: No         OPHTHALMIC EXAM:  Base Eye Exam     Visual Acuity (ETDRS)       Right Left   Dist cc 20/20 +2 20/20 +3    Correction: Glasses         Tonometry (Tonopen, 10:09 AM)       Right Left   Pressure 10 9  Pupils       Pupils   Right PERRL   Left PERRL         Visual Fields       Left Right    Full Full         Extraocular Movement       Right Left    Full Full         Neuro/Psych     Oriented x3: Yes   Mood/Affect: Normal         Dilation     Right eye: 1.0% Mydriacyl, 2.5% Phenylephrine @ 10:09 AM           Slit Lamp and Fundus Exam     External Exam       Right Left   External Normal Normal         Slit Lamp Exam       Right Left   Lids/Lashes Normal Normal   Conjunctiva/Sclera White and quiet White and quiet   Cornea Clear Clear   Anterior Chamber Deep and quiet Deep and quiet   Iris Round and reactive Round and reactive, visible remnants of iris neovascularization, undilated   Lens Posterior chamber intraocular lens,, , 1+ Posterior capsular opacification 2+ Nuclear sclerosis   Anterior Vitreous Normal Normal         Fundus Exam       Right Left   Posterior Vitreous Vitrectomized,    Disc Normal    C/D Ratio 0.15    Macula no macular thickening, no exudates, Microaneurysms, Focal laser scars, no clinically significant macular edema, Epiretinal membrane    Vessels Proliferative diabetic retinopathy quiesced since    Periphery Good PRP and attached             IMAGING AND PROCEDURES  Imaging and Procedures for 02/21/22  OCT, Retina - OU - Both Eyes       Right Eye Quality was good. Scan locations included  subfoveal. Central Foveal Thickness: 327. Progression has worsened. Findings include cystoid macular edema.   Left Eye Quality was good. Scan locations included subfoveal. Central Foveal Thickness: 305. Progression has worsened. Findings include abnormal foveal contour, cystoid macular edema.   Notes CSME OD, worsening temporally, this could be a region of hypoxic damage from untreated sleep apnea which is unconfirmed at this time to have nightly hypoxic events OD, will need treatment today, at 8 weeks post most recent injection.  OS, much improved OS today post Eylea at 4 weeks, follow-up as scheduled     Intravitreal Injection, Pharmacologic Agent - OD - Right Eye       Time Out 02/21/2022. 11:10 AM. Confirmed correct patient, procedure, site, and patient consented.   Anesthesia Topical anesthesia was used. Anesthetic medications included Lidocaine 4%.   Procedure Preparation included 5% betadine to ocular surface, 10% betadine to eyelids. A 30 gauge needle was used.   Injection: 2.5 mg bevacizumab 2.5 MG/0.1ML   Route: Intravitreal, Site: Right Eye   NDC: [phone], Lot: [id number], Expiration date: 04/20/2022   Post-op Post injection exam found visual acuity of at least counting fingers. The patient tolerated the procedure well. There were no complications. The patient received written and verbal post procedure care education. Post injection medications included ocuflox.              ASSESSMENT/PLAN:  Diabetic macular edema of right eye with proliferative retinopathy associated with type 2 diabetes mellitus (HCC) Much improved OD CSME 8 weeks post most recent Avastin  and also 4 weeks post recent contralateral Eylea  Proliferative diabetic retinopathy of left eye with macular edema associated with type 2 diabetes mellitus ([place]) CSME OS improved post Eylea.  Continue and follow-up as scheduled     ICD-10-CM   1. Diabetic macular edema of right eye with proliferative  retinopathy associated with type 2 diabetes mellitus (HCC)  E11.3511 OCT, Retina - OU - Both Eyes    Intravitreal Injection, Pharmacologic Agent - OD - Right Eye    bevacizumab (AVASTIN) SOSY 2.5 mg    2. Proliferative diabetic retinopathy of left eye with macular edema associated with type 2 diabetes mellitus ([place])  G31.5176       1.  OD vastly improved post Avastin at 8-week interval, repeat injection today to maintain  2.  OS recent change from Avastin to [facility] now at 4 weeks postinjection looks great much improved anatomy.  Follow-up as scheduled  3.  Ophthalmic Meds Ordered this visit:  Meds ordered this encounter  Medications   bevacizumab (AVASTIN) SOSY 2.5 mg       Return in about 8 weeks (around 04/18/2022) for dilate, AVASTIN OCT.  There are no Patient Instructions on file for this visit.   Explained the diagnoses, plan, and follow up with the patient and they expressed understanding.  Patient expressed understanding of the importance of proper follow up care.   Clent Demark Ever Gustafson M.D. Diseases & Surgery of the Retina and Vitreous [facility] 02/21/22     Abbreviations: M myopia (nearsighted); A astigmatism; H hyperopia (farsighted); P presbyopia; Mrx spectacle prescription;  CTL contact lenses; OD right eye; OS left eye; OU both eyes  XT exotropia; ET esotropia; PEK punctate epithelial keratitis; PEE punctate epithelial erosions; DES dry eye syndrome; MGD meibomian gland dysfunction; ATs artificial tears; PFAT's preservative free artificial tears; [place] nuclear sclerotic cataract; PSC posterior subcapsular cataract; ERM epi-retinal membrane; PVD posterior vitreous detachment; RD retinal detachment; DM diabetes mellitus; DR diabetic retinopathy; NPDR non-proliferative diabetic retinopathy; PDR proliferative diabetic retinopathy; CSME clinically significant macular edema; DME diabetic macular edema; dbh dot blot hemorrhages; CWS cotton wool spot; POAG primary  open angle glaucoma; C/D cup-to-disc ratio; HVF humphrey visual field; GVF goldmann visual field; OCT optical coherence tomography; IOP intraocular pressure; BRVO Branch retinal vein occlusion; CRVO central retinal vein occlusion; CRAO central retinal artery occlusion; BRAO branch retinal artery occlusion; RT retinal tear; SB scleral buckle; PPV pars plana vitrectomy; VH Vitreous hemorrhage; PRP panretinal laser photocoagulation; IVK intravitreal kenalog; VMT vitreomacular traction; MH Macular hole;  NVD neovascularization of the disc; NVE neovascularization elsewhere; AREDS age related eye disease study; ARMD age related macular degeneration; POAG primary open angle glaucoma; EBMD epithelial/anterior basement membrane dystrophy; ACIOL anterior chamber intraocular lens; IOL intraocular lens; PCIOL posterior chamber intraocular lens; Phaco/IOL phacoemulsification with intraocular lens placement; [place] photorefractive keratectomy; LASIK laser assisted in situ keratomileusis; HTN hypertension; DM diabetes mellitus; COPD chronic obstructive pulmonary disease

## 2022-02-21 NOTE — Assessment & Plan Note (Signed)
Much improved OD CSME 8 weeks post most recent Avastin and also 4 weeks post recent contralateral Eylea

## 2022-02-21 NOTE — Assessment & Plan Note (Signed)
CSME OS improved post Eylea.  Continue and follow-up as scheduled

## 2022-02-23 ENCOUNTER — Other Ambulatory Visit: Payer: Self-pay | Admitting: Family Medicine

## 2022-02-23 NOTE — Telephone Encounter (Signed)
Last filled on 05/01/21  Last ov 11/06/21

## 2022-02-26 ENCOUNTER — Encounter (INDEPENDENT_AMBULATORY_CARE_PROVIDER_SITE_OTHER): Payer: Self-pay | Admitting: Ophthalmology

## 2022-02-26 ENCOUNTER — Ambulatory Visit (INDEPENDENT_AMBULATORY_CARE_PROVIDER_SITE_OTHER): Payer: 59 | Admitting: Ophthalmology

## 2022-02-26 ENCOUNTER — Encounter: Payer: Self-pay | Admitting: Internal Medicine

## 2022-02-26 DIAGNOSIS — E113511 Type 2 diabetes mellitus with proliferative diabetic retinopathy with macular edema, right eye: Secondary | ICD-10-CM | POA: Diagnosis not present

## 2022-02-26 DIAGNOSIS — E113512 Type 2 diabetes mellitus with proliferative diabetic retinopathy with macular edema, left eye: Secondary | ICD-10-CM

## 2022-02-26 DIAGNOSIS — R0683 Snoring: Secondary | ICD-10-CM | POA: Diagnosis not present

## 2022-02-26 MED ORDER — AFLIBERCEPT 2MG/0.05ML IZ SOLN FOR KALEIDOSCOPE
2.0000 mg | INTRAVITREAL | Status: AC | PRN
  Administered 2022-02-26: 2 mg via INTRAVITREAL

## 2022-02-26 NOTE — Assessment & Plan Note (Signed)
OS today with CSME responsive at shorter interval at 5 weeks with resolution.  We will repeat injection OS today and follow-up again in 5 weeks

## 2022-02-26 NOTE — Assessment & Plan Note (Signed)
Patient has not yet had scheduled sleep apnea assessment formally

## 2022-02-26 NOTE — Assessment & Plan Note (Signed)
OD at 5 days post most recent injection confirmatory that CSME is responsive to antivegF therapy.  Follow-up as scheduled OD next

## 2022-02-26 NOTE — Telephone Encounter (Signed)
PCCs, please advise on when pt will be scheduled for HST. Thanks.

## 2022-02-26 NOTE — Progress Notes (Signed)
02/26/2022     CHIEF COMPLAINT Patient presents for  Chief Complaint  Patient presents with   Diabetic Retinopathy with Macular Edema      HISTORY OF PRESENT ILLNESS: Mitchell Palmer is a 56 y.o. male who presents to the clinic today for:   HPI   5 weeks for DILATE OS, EYLEA, OCT.  Currently at shorter interval. Pt stated vision is stable but has noticed a new floater in the right eye.  Last edited by Hurman Horn, MD on 02/26/2022  9:00 AM.      Referring physician: Tower, Wynelle Fanny, MD [place],  [place] [id number]  HISTORICAL INFORMATION:   Selected notes from the MEDICAL RECORD NUMBER    Lab Results  Component Value Date   HGBA1C 8.3 (A) 11/06/2021     CURRENT MEDICATIONS: No current outpatient medications on file. (Ophthalmic Drugs)   No current facility-administered medications for this visit. (Ophthalmic Drugs)   Current Outpatient Medications (Other)  Medication Sig   B-D UF III MINI PEN NEEDLES 31G X 5 MM MISC USE AS DIRECTED 4 TIMES A DAY   cetirizine (ZYRTEC) 10 MG tablet Take 10 mg by mouth daily.   Cholecalciferol (VITAMIN D) 50 MCG (2000 UT) tablet Take 2,000 Units by mouth daily.   Continuous Blood Gluc Sensor (FREESTYLE LIBRE 3 SENSOR) MISC 1 applicator by Does not apply route every 14 (fourteen) days.   cyclobenzaprine (FLEXERIL) 10 MG tablet Take 1 tablet (10 mg total) by mouth 3 (three) times daily as needed.   fluticasone (FLONASE) 50 MCG/ACT nasal spray Place 2 sprays into both nostrils daily as needed for allergies or rhinitis.   insulin lispro (HUMALOG KWIKPEN) 100 UNIT/ML KwikPen INJECT 15-25 UNITS INTO THE SKIN 3 TIMES DAILY W/ MEALS AND 10-12 UNITS INTO THE SKIN W/ SNACKS (Patient taking differently: Inject 10-30 Units into the skin 3 (three) times daily. INJECT 25-30 UNITS INTO THE SKIN 3 TIMES DAILY W/ MEALS AND 10-12 UNITS INTO THE SKIN W/ SNACKS)   JARDIANCE 25 MG TABS tablet TAKE 1 TABLET BY MOUTH DAILY BEFORE  BREAKFAST.   LANTUS SOLOSTAR 100 UNIT/ML Solostar Pen INJECT 42 UNITS IN THE MORNING AND 50 UNITS IN THE EVENING   levothyroxine (SYNTHROID) 75 MCG tablet TAKE 1 TABLET BY MOUTH ONCE A DAY ON AN EMPTY STOMACH 30 MINUTES BEFORE EATING.   metFORMIN (GLUCOPHAGE) 1000 MG tablet Take 1 tablet (1,000 mg total) by mouth 2 (two) times daily with a meal.   olmesartan (BENICAR) 40 MG tablet TAKE 1 TABLET BY MOUTH EVERY DAY   omeprazole (PRILOSEC) 40 MG capsule TAKE 1 CAPSULE (40 MG TOTAL) BY MOUTH DAILY.   ONETOUCH ULTRA test strip USE TO TEST BLOOD SUGAR 4 TIMES DAILY AS DIRECTED   rosuvastatin (CRESTOR) 10 MG tablet Take 1 tablet (10 mg total) by mouth daily.   tadalafil (CIALIS) 20 MG tablet Take 1 tablet (20 mg total) by mouth daily as needed for erectile dysfunction.   tirzepatide (MOUNJARO) 10 MG/0.5ML Pen Inject 10 mg into the skin once a week.   vitamin B-12 (CYANOCOBALAMIN) 1000 MCG tablet Take 1,000 mcg by mouth daily.   No current facility-administered medications for this visit. (Other)      REVIEW OF SYSTEMS: ROS   Negative for: Constitutional, Gastrointestinal, Neurological, Skin, Genitourinary, Musculoskeletal, HENT, Endocrine, Cardiovascular, Eyes, Respiratory, Psychiatric, Allergic/Imm, Heme/Lymph Last edited by Silvestre Moment on 02/26/2022  8:31 AM.       ALLERGIES Allergies  Allergen Reactions  Codeine     Anxious   Lipitor [Atorvastatin]     Muscle pain     PAST MEDICAL HISTORY Past Medical History:  Diagnosis Date   Allergy    allegic rhinitis   Diabetes mellitus    type II   Esophageal stricture    GERD (gastroesophageal reflux disease)    History of hernia repair    as a baby   Hyperlipidemia    Hyperplastic polyp of intestine 2010   Hypertension    Proliferative diabetic retinopathy of left eye ([place]) 03/31/2020   This is active as a patient still has tufts of the anterior segment neovascularization, rubeosis in the left eye every region of capillary nonperfusion  should be treated with PRP.   S/P ear surgery, follow-up exam    for drainage   Vitreous hemorrhage of right eye ([place]) 12/24/2019   Past Surgical History:  Procedure Laterality Date   BIOPSY  12/07/2021   Procedure: BIOPSY;  Surgeon: Ladene Artist, MD;  Location: [facility];  Service: Gastroenterology;;   CATARACT EXTRACTION     COLONOSCOPY  03/07/2016   COLONOSCOPY WITH PROPOFOL N/A 12/07/2021   Procedure: COLONOSCOPY WITH PROPOFOL;  Surgeon: Ladene Artist, MD;  Location: [facility];  Service: Gastroenterology;  Laterality: N/A;   ESOPHAGOGASTRODUODENOSCOPY  04/20/2009   stricture/GERD   ESOPHAGOGASTRODUODENOSCOPY (EGD) WITH PROPOFOL N/A 12/07/2021   Procedure: ESOPHAGOGASTRODUODENOSCOPY (EGD) WITH PROPOFOL;  Surgeon: Ladene Artist, MD;  Location: [facility];  Service: Gastroenterology;  Laterality: N/A;   EYE SURGERY     HERNIA REPAIR     age 85-midline   INNER EAR SURGERY  08/20/1986   went in behind rt [place] [facility]   POLYPECTOMY     SAVORY DILATION N/A 12/07/2021   Procedure: SAVORY DILATION;  Surgeon: Ladene Artist, MD;  Location: [facility];  Service: Gastroenterology;  Laterality: N/A;   SHOULDER ARTHROSCOPY WITH ROTATOR CUFF REPAIR AND SUBACROMIAL DECOMPRESSION Right 09/29/2012   Procedure: SHOULDER ARTHROSCOPY WITH ROTATOR CUFF REPAIR AND SUBACROMIAL DECOMPRESSION;  Surgeon: Nita Sells, MD;  Location: [facility];  Service: Orthopedics;  Laterality: Right;  Right shoulder arthroscopy with subacromial decompression and distal clavicle excision & Debridement of Labrial Tear   SHOULDER SURGERY Left    x2   UPPER GASTROINTESTINAL ENDOSCOPY      FAMILY HISTORY Family History  Problem Relation Age of Onset   Diabetes Mother    Colon cancer Father 26   Diabetes Other    Colon polyps Brother    Diabetes Maternal Aunt    Cancer Cousin        breast cancer    SOCIAL HISTORY Social History    Tobacco Use   Smoking status: Never   Smokeless tobacco: Never  Vaping Use   Vaping Use: Never used  Substance Use Topics   Alcohol use: Yes    Alcohol/week: 12.0 standard drinks of alcohol    Types: 12 Cans of beer per week   Drug use: No         OPHTHALMIC EXAM:  Base Eye Exam     Visual Acuity (ETDRS)       Right Left   Dist cc 20/20 -1 20/20 -1    Correction: Glasses         Tonometry (Tonopen, 8:34 AM)       Right Left   Pressure 15 9         Pupils  Pupils APD   Right PERRL None   Left PERRL None         Visual Fields       Left Right    Full Full         Extraocular Movement       Right Left    Full Full         Neuro/Psych     Oriented x3: Yes   Mood/Affect: Normal         Dilation     Left eye: 2.5% Phenylephrine, 1.0% Mydriacyl @ 8:34 AM           Slit Lamp and Fundus Exam     External Exam       Right Left   External Normal Normal         Slit Lamp Exam       Right Left   Lids/Lashes Normal Normal   Conjunctiva/Sclera White and quiet White and quiet   Cornea Clear Clear   Anterior Chamber Deep and quiet Deep and quiet   Iris Round and reactive Round and reactive, visible remnants of iris neovascularization, undilated   Lens Posterior chamber intraocular lens,, , 1+ Posterior capsular opacification 2+ Nuclear sclerosis   Anterior Vitreous Normal Normal         Fundus Exam       Right Left   Posterior Vitreous  no posterior vitreous detachment   Disc  Normal   C/D Ratio  0.2   Macula  Microaneurysms, no macular thickening, no clinically significant macular edema   Vessels  Proliferative diabetic retinopathy, with good PRP yet there are a temporal window anterior to the equator in the left eye in addition to the region temporal to the macula in the left eye with large regions of capillary nonperfusion which might be triggering the anterior segment neovascularization   Periphery  Good PRP  and attached, some room anterior in the temporal retina for more PRP if disease reactivates, small NVE superior            IMAGING AND PROCEDURES  Imaging and Procedures for 02/26/22  OCT, Retina - OU - Both Eyes       Right Eye Quality was good. Scan locations included subfoveal. Central Foveal Thickness: 322. Progression has been stable. Findings include cystoid macular edema, epiretinal membrane.   Left Eye Quality was good. Scan locations included subfoveal. Central Foveal Thickness: 309. Progression has worsened. Findings include abnormal foveal contour, cystoid macular edema.   Notes CSME OD, improved CSME OD at 5 days post most recent injection, incidental minor ERM temporally  OS, much improved OS today post Eylea at 5 weeks, with low-lying PVD, no longer VMA OS     Intravitreal Injection, Pharmacologic Agent - OS - Left Eye       Time Out 02/26/2022. 9:04 AM. Confirmed correct patient, procedure, site, and patient consented.   Anesthesia Topical anesthesia was used. Anesthetic medications included Lidocaine 4%.   Procedure Preparation included 5% betadine to ocular surface, 10% betadine to eyelids, Tobramycin 0.3%. A 30 gauge needle was used.   Injection: 2 mg aflibercept 2 MG/0.05ML   Route: Intravitreal, Site: Left Eye   NDC: [id number], Lot: [id number], Expiration date: 11/19/2022, Waste: 0 mL   Post-op Post injection exam found visual acuity of at least counting fingers. The patient tolerated the procedure well. There were no complications. The patient received written and verbal post procedure care education. Post injection medications were not given.  ASSESSMENT/PLAN:  Diabetic macular edema of right eye with proliferative retinopathy associated with type 2 diabetes mellitus (HCC) OD at 5 days post most recent injection confirmatory that CSME is responsive to antivegF therapy.  Follow-up as scheduled OD next  Proliferative diabetic  retinopathy of left eye with macular edema associated with type 2 diabetes mellitus ([place]) OS today with CSME responsive at shorter interval at 5 weeks with resolution.  We will repeat injection OS today and follow-up again in 5 weeks  Loud snoring Patient has not yet had scheduled sleep apnea assessment formally     ICD-10-CM   1. Proliferative diabetic retinopathy of left eye with macular edema associated with type 2 diabetes mellitus (HCC)  Q42.1031 OCT, Retina - OU - Both Eyes    Intravitreal Injection, Pharmacologic Agent - OS - Left Eye    aflibercept (EYLEA) SOLN 2 mg    2. Diabetic macular edema of right eye with proliferative retinopathy associated with type 2 diabetes mellitus ([place])  E11.3511     3. Loud snoring  R06.83       OD, vastly improved to 5 days post most recent injection Eylea.  Follow-up as scheduled  2.  OS, CSME also remains resolved at shorter interval 5 weeks post Eylea.  Follow-up as scheduled repeat injection today and follow-up next in 5 weeks  3.  Ophthalmic Meds Ordered this visit:  Meds ordered this encounter  Medications   aflibercept (EYLEA) SOLN 2 mg       Return in about 5 weeks (around 04/02/2022) for dilate, OS, EYLEA OCT, and OD follow-up as scheduled.  There are no Patient Instructions on file for this visit.   Explained the diagnoses, plan, and follow up with the patient and they expressed understanding.  Patient expressed understanding of the importance of proper follow up care.   Clent Demark Aimie Wagman M.D. Diseases & Surgery of the Retina and Vitreous Retina & Diabetic [place] 02/26/22     Abbreviations: M myopia (nearsighted); A astigmatism; H hyperopia (farsighted); P presbyopia; Mrx spectacle prescription;  CTL contact lenses; OD right eye; OS left eye; OU both eyes  XT exotropia; ET esotropia; PEK punctate epithelial keratitis; PEE punctate epithelial erosions; DES dry eye syndrome; MGD meibomian gland dysfunction; ATs artificial  tears; PFAT's preservative free artificial tears; [place] nuclear sclerotic cataract; PSC posterior subcapsular cataract; ERM epi-retinal membrane; PVD posterior vitreous detachment; RD retinal detachment; DM diabetes mellitus; DR diabetic retinopathy; NPDR non-proliferative diabetic retinopathy; PDR proliferative diabetic retinopathy; CSME clinically significant macular edema; DME diabetic macular edema; dbh dot blot hemorrhages; CWS cotton wool spot; POAG primary open angle glaucoma; C/D cup-to-disc ratio; HVF humphrey visual field; GVF goldmann visual field; OCT optical coherence tomography; IOP intraocular pressure; BRVO Branch retinal vein occlusion; CRVO central retinal vein occlusion; CRAO central retinal artery occlusion; BRAO branch retinal artery occlusion; RT retinal tear; SB scleral buckle; PPV pars plana vitrectomy; VH Vitreous hemorrhage; PRP panretinal laser photocoagulation; IVK intravitreal kenalog; VMT vitreomacular traction; MH Macular hole;  NVD neovascularization of the disc; NVE neovascularization elsewhere; AREDS age related eye disease study; ARMD age related macular degeneration; POAG primary open angle glaucoma; EBMD epithelial/anterior basement membrane dystrophy; ACIOL anterior chamber intraocular lens; IOL intraocular lens; PCIOL posterior chamber intraocular lens; Phaco/IOL phacoemulsification with intraocular lens placement; [place] photorefractive keratectomy; LASIK laser assisted in situ keratomileusis; HTN hypertension; DM diabetes mellitus; COPD chronic obstructive pulmonary disease

## 2022-02-27 NOTE — Telephone Encounter (Signed)
Sensors labeled for pick up.

## 2022-03-15 ENCOUNTER — Ambulatory Visit: Payer: 59

## 2022-03-15 DIAGNOSIS — G4733 Obstructive sleep apnea (adult) (pediatric): Secondary | ICD-10-CM | POA: Diagnosis not present

## 2022-03-15 DIAGNOSIS — R0683 Snoring: Secondary | ICD-10-CM

## 2022-03-21 ENCOUNTER — Ambulatory Visit (INDEPENDENT_AMBULATORY_CARE_PROVIDER_SITE_OTHER): Payer: 59 | Admitting: Internal Medicine

## 2022-03-21 ENCOUNTER — Encounter: Payer: Self-pay | Admitting: Internal Medicine

## 2022-03-21 VITALS — BP 140/94 | HR 77 | Ht 68.0 in | Wt 224.2 lb

## 2022-03-21 DIAGNOSIS — Z794 Long term (current) use of insulin: Secondary | ICD-10-CM | POA: Diagnosis not present

## 2022-03-21 DIAGNOSIS — E039 Hypothyroidism, unspecified: Secondary | ICD-10-CM

## 2022-03-21 DIAGNOSIS — E11319 Type 2 diabetes mellitus with unspecified diabetic retinopathy without macular edema: Secondary | ICD-10-CM

## 2022-03-21 DIAGNOSIS — E7800 Pure hypercholesterolemia, unspecified: Secondary | ICD-10-CM

## 2022-03-21 LAB — POCT GLYCOSYLATED HEMOGLOBIN (HGB A1C): Hemoglobin A1C: 7.3 % — AB (ref 4.0–5.6)

## 2022-03-21 MED ORDER — LANTUS SOLOSTAR 100 UNIT/ML ~~LOC~~ SOPN: PEN_INJECTOR | SUBCUTANEOUS | 3 refills | Status: DC

## 2022-03-21 MED ORDER — INSULIN LISPRO (1 UNIT DIAL) 100 UNIT/ML (KWIKPEN): PEN_INJECTOR | SUBCUTANEOUS | 3 refills | Status: DC

## 2022-03-21 MED ORDER — LEVOTHYROXINE SODIUM 75 MCG PO TABS
75.0000 ug | ORAL_TABLET | Freq: Every day | ORAL | 3 refills | Status: DC
Start: 2022-03-21 — End: 2023-04-04

## 2022-03-21 NOTE — Patient Instructions (Addendum)
Please continue: - Metformin 1000 mg 2x a day with meals - Jardiance 25 mg before breakfast - Lantus 42 units at waking up and 50 units at bedtime - Mounjaro 10 mg weekly - Humalog (15 min before each meal) 30-35 units for breakfast but 20-25 units before the rest of the meals  Please let me know in ~1 month if sugars do not improve.  Please return in 3-4 months.

## 2022-03-21 NOTE — Progress Notes (Signed)
Subjective:     Patient ID: Mitchell Palmer, male   DOB: 1966/07/08, 56 y.o.   MRN: [id number]   HPI Mitchell Palmer is a 56 y.o. man, returning for followup for DM2, dx in 2000, uncontrolled, insulin-dependent, with complications (CKD stage 3, erectile dysfunction, mild DR). Last visit 4.5 months ago.  Interim history: He was prev. shoulder and knee steroid injections >> had shoulder sx 07/25/2021.  He continues to get steroid injections, though. No increased urination, blurry vision, nausea, chest pain. He was able to change from Trulicity to [facility] since last visit.  He was also able to start a freestyle libre  3 CGM since then.  Reviewed HbA1c levels: Lab Results  Component Value Date   HGBA1C 8.3 (A) 11/06/2021   HGBA1C 8.1 (A) 02/22/2021   HGBA1C 8.0 (A) 10/17/2020   HGBA1C 7.8 (A) 05/11/2020   HGBA1C 7.5 (A) 01/07/2020   HGBA1C 7.5 (A) 09/08/2019   HGBA1C 7.1 (A) 04/16/2019   HGBA1C 7.6 (A) 09/03/2018   HGBA1C 8.2 (A) 05/21/2018   HGBA1C 7.4 (A) 01/27/2018   HGBA1C 7.6 10/21/2017   HGBA1C 8.0 05/10/2017   HGBA1C 7.4 11/15/2016   HGBA1C 7.6 08/15/2016   HGBA1C 7.7 05/15/2016   HGBA1C 7.8 02/15/2016   HGBA1C 7.7 11/08/2015   HGBA1C 8.0 (H) 07/22/2015   HGBA1C 8.9 (H) 12/06/2014   HGBA1C 9.1 (H) 03/01/2014   He is on: - Metformin 1000 mg 2x a day with meals - Jardiance 10 mg before breakfast - added 08/2018 >> 25 mg daily - Trulicity 1.5 >> 3 >> 4.5 mg weekly >> Mounjaro 10 units weekly - Lantus 35 >> 42 units 2x a day >> 42 units at waking up and 50 units at bedtime - Humalog 20-25 units before each meal, but 25 units with b'fast (40 when on steroids) Uses 10-15 units for correction.  He was on: - he stopped  Invokana 08/2016 as he had leg tingling - Cycloset - started 04/22/2013 >> at 1.6 mg daily (tried 3 tabs >> dizziness >> backed off to 2) - Victoza 1.8 mg daily - Actos 45 mg daily - Januvia - glyburide/metformin - Avandia. - Bydureon 2 mg weekly - he hated  the thick needle   He is checking his CBG more than 4 times a day with his newly acquired freestyle libre 3 CGM:  Previously: - am:  125-145 >> 135-175 >> waking up: 125-165 >> 130-140 - 2 h after b'fast:  79-85 >> 181-215 >> n/c - before lunch:  n/c >> 125-145 >> 155-200 >> 180-240 - 2 h after lunch:  130-145 >> 171-184 >> 230 >> n/c - Before dinner: 150-180 >> 77, 150-200s >> 120-140 - nighttime:110-125 >> 145-180 >> 145-180 >> bedtime: n/c Lowest: 80s >> 77 >> 71 >> 70;  He has hypoglycemia awareness in the 80s. Highest 340 (sweets), 400 (steroids) >> 300  He works nights: 6 PM to 7:30 AM 3-4 days a week.  Meals: - 5:30-6 pm (at work): subway sandwich >> 2 hot dogs + doritos >> chicken biscuit - snack at 9-10 pm: pistachios or 1/2 sandwich; pizza  >> 12 am: nabs +peanuts, apples - 11 am-12 pm: salad, meat + veggies + rice (leftovers from take out), pizza rarely  + HL. Last lipid panel: Lab Results  Component Value Date   CHOL 133 10/27/2021   HDL 45.50 10/27/2021   LDLCALC 37 10/20/2019   LDLDIRECT 63.0 10/27/2021   TRIG 210.0 (H) 10/27/2021   CHOLHDL 3 10/27/2021  On Lipitor 40.  -+ Stage II CKD, last BUN/Cr:  Lab Results  Component Value Date   BUN 14 10/27/2021   CREATININE 1.12 10/27/2021   Lab Results  Component Value Date   MICRALBCREAT 0.9 02/15/2016   MICRALBCREAT 1.0 12/07/2014   MICRALBCREAT 0.5 11/04/2012  On olmesartan.  - last eye exam was in 10/2021: + NPDR OS, without macular edema.  Dr. Katy Fitch >> now Dr. Zadie Rhine.  He had laser surgery [facility].  Also, In 07/2018 >> had EMG surgery in his R eye for bleeding. He had cataract sx (Dr. Katy Fitch), another surgery (Dr. Zadie Rhine) on R eye in 2021.  - no numbness or tingling in his legs.  Foot exam was done in 10/2021.  He also has a history of HTN, GERD, esophageal stricture, plantar fasciitis, eustachian tube dysfunction - status post ear surgery.  Hypothyroidism: -Started levothyroxine 01/2016 -He was skipping  doses in the past and TFTs were fluctuating.  Pt is on levothyroxine 75 mcg daily, taken: - in am - fasting - at least 30 min from b'fast - no calcium - no iron - no multivitamins - no PPIs - not on Biotin  Reviewed his TFTs: Lab Results  Component Value Date   TSH 4.19 10/27/2021   TSH 2.47 09/02/2020   TSH 1.85 10/20/2019   TSH 2.95 10/21/2018   TSH 5.18 (H) 09/18/2018   TSH 4.25 10/31/2017   TSH 1.17 09/21/2016   TSH 3.05 04/24/2016   TSH 4.97 (H) 03/06/2016   TSH 8.97 (H) 01/23/2016   In 07/2018, he had C spine surgery.  Review of Systems + see HPI + joint aches  I reviewed pt's medications, allergies, PMH, social hx, family hx, and changes were documented in the history of present illness. Otherwise, unchanged from my initial visit note.  Past Medical History:  Diagnosis Date   Allergy    allegic rhinitis   Diabetes mellitus    type II   Esophageal stricture    GERD (gastroesophageal reflux disease)    History of hernia repair    as a baby   Hyperlipidemia    Hyperplastic polyp of intestine 2010   Hypertension    Proliferative diabetic retinopathy of left eye ([place]) 03/31/2020   This is active as a patient still has tufts of the anterior segment neovascularization, rubeosis in the left eye every region of capillary nonperfusion should be treated with PRP.   S/P ear surgery, follow-up exam    for drainage   Vitreous hemorrhage of right eye ([place]) 12/24/2019   Past Surgical History:  Procedure Laterality Date   BIOPSY  12/07/2021   Procedure: BIOPSY;  Surgeon: Ladene Artist, MD;  Location: [facility];  Service: Gastroenterology;;   CATARACT EXTRACTION     COLONOSCOPY  03/07/2016   COLONOSCOPY WITH PROPOFOL N/A 12/07/2021   Procedure: COLONOSCOPY WITH PROPOFOL;  Surgeon: Ladene Artist, MD;  Location: [facility];  Service: Gastroenterology;  Laterality: N/A;   ESOPHAGOGASTRODUODENOSCOPY  04/20/2009   stricture/GERD   ESOPHAGOGASTRODUODENOSCOPY (EGD)  WITH PROPOFOL N/A 12/07/2021   Procedure: ESOPHAGOGASTRODUODENOSCOPY (EGD) WITH PROPOFOL;  Surgeon: Ladene Artist, MD;  Location: [facility];  Service: Gastroenterology;  Laterality: N/A;   EYE SURGERY     HERNIA REPAIR     age 65-midline   INNER EAR SURGERY  08/20/1986   went in behind [facility] [place] [facility]   POLYPECTOMY     SAVORY DILATION N/A 12/07/2021   Procedure:  SAVORY DILATION;  Surgeon: Ladene Artist, MD;  Location: [facility];  Service: Gastroenterology;  Laterality: N/A;   SHOULDER ARTHROSCOPY WITH ROTATOR CUFF REPAIR AND SUBACROMIAL DECOMPRESSION Right 09/29/2012   Procedure: SHOULDER ARTHROSCOPY WITH ROTATOR CUFF REPAIR AND SUBACROMIAL DECOMPRESSION;  Surgeon: Nita Sells, MD;  Location: [facility];  Service: Orthopedics;  Laterality: Right;  Right shoulder arthroscopy with subacromial decompression and distal clavicle excision & Debridement of Labrial Tear   SHOULDER SURGERY Left    x2   UPPER GASTROINTESTINAL ENDOSCOPY     Social History   Socioeconomic History   Marital status: Single    Spouse name: Not on file   Number of children: Not on file   Years of education: Not on file   Highest education level: Not on file  Occupational History   Occupation: Mining engineer  Tobacco Use   Smoking status: Never   Smokeless tobacco: Never  Vaping Use   Vaping Use: Never used  Substance and Sexual Activity   Alcohol use: Yes    Alcohol/week: 12.0 standard drinks of alcohol    Types: 12 Cans of beer per week   Drug use: No   Sexual activity: Not on file  Other Topics Concern   Not on file  Social History Narrative   Not on file   Social Determinants of Health   Financial Resource Strain: Not on file  Food Insecurity: Not on file  Transportation Needs: Not on file  Physical Activity: Not on file  Stress: Not on file  Social Connections: Not on file  Intimate Partner Violence: Not on file   Current  Outpatient Medications on File Prior to Visit  Medication Sig Dispense Refill   B-D UF III MINI PEN NEEDLES 31G X 5 MM MISC USE AS DIRECTED 4 TIMES A DAY 400 each 3   cetirizine (ZYRTEC) 10 MG tablet Take 10 mg by mouth daily.     Cholecalciferol (VITAMIN D) 50 MCG (2000 UT) tablet Take 2,000 Units by mouth daily.     Continuous Blood Gluc Sensor (FREESTYLE LIBRE 3 SENSOR) MISC 1 applicator by Does not apply route every 14 (fourteen) days. 6 each 3   cyclobenzaprine (FLEXERIL) 10 MG tablet Take 1 tablet (10 mg total) by mouth 3 (three) times daily as needed. 30 tablet 3   fluticasone (FLONASE) 50 MCG/ACT nasal spray Place 2 sprays into both nostrils daily as needed for allergies or rhinitis. 16 mL 3   insulin lispro (HUMALOG KWIKPEN) 100 UNIT/ML KwikPen INJECT 15-25 UNITS INTO THE SKIN 3 TIMES DAILY W/ MEALS AND 10-12 UNITS INTO THE SKIN W/ SNACKS (Patient taking differently: Inject 10-30 Units into the skin 3 (three) times daily. INJECT 25-30 UNITS INTO THE SKIN 3 TIMES DAILY W/ MEALS AND 10-12 UNITS INTO THE SKIN W/ SNACKS) 60 mL 3   JARDIANCE 25 MG TABS tablet TAKE 1 TABLET BY MOUTH DAILY BEFORE BREAKFAST. 30 tablet 2   LANTUS SOLOSTAR 100 UNIT/ML Solostar Pen INJECT 42 UNITS IN THE MORNING AND 50 UNITS IN THE EVENING 60 mL 3   levothyroxine (SYNTHROID) 75 MCG tablet TAKE 1 TABLET BY MOUTH ONCE A DAY ON AN EMPTY STOMACH 30 MINUTES BEFORE EATING. 30 tablet 1   metFORMIN (GLUCOPHAGE) 1000 MG tablet Take 1 tablet (1,000 mg total) by mouth 2 (two) times daily with a meal. 60 tablet 11   olmesartan (BENICAR) 40 MG tablet TAKE 1 TABLET BY MOUTH EVERY DAY 90 tablet 2   omeprazole (PRILOSEC) 40 MG capsule  TAKE 1 CAPSULE (40 MG TOTAL) BY MOUTH DAILY. 90 capsule 1   ONETOUCH ULTRA test strip USE TO TEST BLOOD SUGAR 4 TIMES DAILY AS DIRECTED 400 strip 11   rosuvastatin (CRESTOR) 10 MG tablet Take 1 tablet (10 mg total) by mouth daily. 30 tablet 11   tadalafil (CIALIS) 20 MG tablet Take 1 tablet (20 mg  total) by mouth daily as needed for erectile dysfunction. 4 tablet 5   tirzepatide (MOUNJARO) 10 MG/0.5ML Pen Inject 10 mg into the skin once a week. 2 mL 3   vitamin B-12 (CYANOCOBALAMIN) 1000 MCG tablet Take 1,000 mcg by mouth daily.     No current facility-administered medications on file prior to visit.   Allergies  Allergen Reactions   Codeine     Anxious   Lipitor [Atorvastatin]     Muscle pain    Family History  Problem Relation Age of Onset   Diabetes Mother    Colon cancer Father 74   Diabetes Other    Colon polyps Brother    Diabetes Maternal Aunt    Cancer Cousin        breast cancer     Objective:   Physical Exam BP (!) 140/94 (BP Location: Left Arm, Patient Position: Sitting, Cuff Size: Normal)   Pulse 77   Ht '5\' 8"'$  (1.727 m)   Wt 224 lb 3.2 oz (101.7 kg)   SpO2 97%   BMI 34.09 kg/m     Wt Readings from Last 3 Encounters:  03/21/22 224 lb 3.2 oz (101.7 kg)  12/29/21 223 lb (101.2 kg)  12/07/21 230 lb (104.3 kg)   Constitutional: overweight, in NAD Eyes: no exophthalmos ENT: moist mucous membranes, no masses palpated in neck, no cervical lymphadenopathy Cardiovascular: RRR, No MRG Respiratory: CTA B Musculoskeletal: no deformities Skin: moist, warm, no rashes Neurological: no tremor with outstretched hands  Assessment:     1. 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 HL  3. Hypothyroidism    Plan:     1.  Patient with longstanding, uncontrolled, type 2 diabetes, on a complex medication regimen including oral medications with metformin and SGLT2 inhibitor and also weekly GLP-1 receptor agonist and basal/bolus insulin regimen.  At last visit, HbA1c was still high, 8.3%. At that time, sugars were at or slightly above target in the morning but they were increasing significantly before his lunch.  He was snacking  on dried fruit and peanut and I advised him to start dry fruit.  We also discussed about increasing his insulin before breakfast.  I also recommended a freestyle libre 3 CGM and gave him a sample.  I applied this to his arm. CGM interpretation: -At today's visit, we reviewed his CGM downloads: It appears that 57% of values are in target range (goal >70%), while 43% are higher than 180 (goal <25%), and 0% are lower than 70 (goal <4%).  The calculated average blood sugar is 177.  The projected HbA1c for the next 3 months (GMI) is 7.5%. -Reviewing the CGM trends, it appears that his sugars have quite fluctuating, mostly around the upper limit of the target range.  Sugars increased the most after breakfast and then after dinner.  However, reviewing individual traces, it appears that he has instances in which the sugars increased abruptly and then they drop immediately after the initial postprandial hyperglycemic peak.  We discussed  that this is consistent with missing the insulin injection before the meal and taking it too late after he already started eating.  I advised him to always make sure that he is taking the insulin injections 15 minutes before each meal. -Due to the improvement in his HbA1c since last visit, I did not advise him to increase Mounjaro now.  However, I advised him to let me know in approximately 1 month if the sugars do not improve, in which case, we can increase the dose to 12.5 mg weekly. - I advised him to: Patient Instructions  Please continue: - Metformin 1000 mg 2x a day with meals - Jardiance 25 mg before breakfast - Lantus 42 units at waking up and 50 units at bedtime - Mounjaro 10 mg weekly - Humalog (15 min before each meal) 30-35 units for breakfast but 20-25 units before the rest of the meals  Please let me know in ~1 month if sugars do not improve.  Please return in 3-4 months.  - we checked his HbA1c: 7.3% (lower) - advised to check sugars at different times of the  day - 4x a day, rotating check times - advised for yearly eye exams >> he is UTD - return to clinic in 3-4 months  2. HL  -Reviewed latest lipid panel from 10/2021: LDL at goal, triglycerides high: Lab Results  Component Value Date   CHOL 133 10/27/2021   HDL 45.50 10/27/2021   LDLCALC 37 10/20/2019   LDLDIRECT 63.0 10/27/2021   TRIG 210.0 (H) 10/27/2021   CHOLHDL 3 10/27/2021  -He continues on Lipitor 40 mg daily without side effects.  3. Hypothyroidism - latest thyroid labs reviewed with pt. >> normal: Lab Results  Component Value Date   TSH 4.19 10/27/2021  - he continues on LT4 75 mcg daily - pt feels good on this dose. - we discussed about taking the thyroid hormone every day, with water, >30 minutes before breakfast, separated by >4 hours from acid reflux medications, calcium, iron, multivitamins. Pt. is taking it correctly.  Philemon Kingdom, MD PhD [facility]

## 2022-03-23 ENCOUNTER — Other Ambulatory Visit: Payer: Self-pay | Admitting: Family Medicine

## 2022-03-23 ENCOUNTER — Encounter: Payer: Self-pay | Admitting: Internal Medicine

## 2022-03-23 ENCOUNTER — Other Ambulatory Visit: Payer: Self-pay | Admitting: Internal Medicine

## 2022-03-23 DIAGNOSIS — E11319 Type 2 diabetes mellitus with unspecified diabetic retinopathy without macular edema: Secondary | ICD-10-CM

## 2022-03-26 DIAGNOSIS — G4733 Obstructive sleep apnea (adult) (pediatric): Secondary | ICD-10-CM | POA: Diagnosis not present

## 2022-04-02 ENCOUNTER — Ambulatory Visit (INDEPENDENT_AMBULATORY_CARE_PROVIDER_SITE_OTHER): Payer: 59 | Admitting: Ophthalmology

## 2022-04-02 DIAGNOSIS — E113512 Type 2 diabetes mellitus with proliferative diabetic retinopathy with macular edema, left eye: Secondary | ICD-10-CM | POA: Diagnosis not present

## 2022-04-02 DIAGNOSIS — E113511 Type 2 diabetes mellitus with proliferative diabetic retinopathy with macular edema, right eye: Secondary | ICD-10-CM

## 2022-04-02 MED ORDER — AFLIBERCEPT 2MG/0.05ML IZ SOLN FOR KALEIDOSCOPE
2.0000 mg | INTRAVITREAL | Status: AC | PRN
  Administered 2022-04-02: 2 mg via INTRAVITREAL

## 2022-04-02 NOTE — Progress Notes (Signed)
04/02/2022     CHIEF COMPLAINT Patient presents for  Chief Complaint  Patient presents with   Diabetic Retinopathy with Macular Edema      HISTORY OF PRESENT ILLNESS: Mitchell Palmer is a 56 y.o. male who presents to the clinic today for:   HPI   5 weeks dilate os eylea oct  Pt states his vision has been stable Pt denies any new floaters or fol  Pt states "when I look at a straight line it looks wavy"  OD with new onset blurred vision as of yesterday.  Currently right eye at 5 weeks 5 days after most recent injection, Avastin Last edited by Hurman Horn, MD on 04/02/2022  9:31 AM.      Referring physician: Tower, Wynelle Fanny, MD [place],  [place] [address]  HISTORICAL INFORMATION:   Selected notes from the MEDICAL RECORD NUMBER    Lab Results  Component Value Date   HGBA1C 7.3 (A) 03/21/2022     CURRENT MEDICATIONS: No current outpatient medications on file. (Ophthalmic Drugs)   No current facility-administered medications for this visit. (Ophthalmic Drugs)   Current Outpatient Medications (Other)  Medication Sig   B-D UF III MINI PEN NEEDLES 31G X 5 MM MISC USE AS DIRECTED 4 TIMES A DAY   cetirizine (ZYRTEC) 10 MG tablet Take 10 mg by mouth daily.   Cholecalciferol (VITAMIN D) 50 MCG (2000 UT) tablet Take 2,000 Units by mouth daily.   Continuous Blood Gluc Sensor (FREESTYLE LIBRE 3 SENSOR) MISC 1 applicator by Does not apply route every 14 (fourteen) days.   cyclobenzaprine (FLEXERIL) 10 MG tablet Take 1 tablet (10 mg total) by mouth 3 (three) times daily as needed.   fluticasone (FLONASE) 50 MCG/ACT nasal spray Place 2 sprays into both nostrils daily as needed for allergies or rhinitis.   insulin glargine (LANTUS SOLOSTAR) 100 UNIT/ML Solostar Pen INJECT 42 UNITS IN THE MORNING AND 50 UNITS IN THE EVENING   insulin lispro (HUMALOG KWIKPEN) 100 UNIT/ML KwikPen INJECT 15-25 UNITS INTO THE SKIN 3 TIMES DAILY W/ MEALS AND 10-12 UNITS INTO THE SKIN W/  SNACKS   JARDIANCE 25 MG TABS tablet TAKE 1 TABLET BY MOUTH DAILY BEFORE BREAKFAST.   levothyroxine (SYNTHROID) 75 MCG tablet Take 1 tablet (75 mcg total) by mouth daily before breakfast.   metFORMIN (GLUCOPHAGE) 1000 MG tablet Take 1 tablet (1,000 mg total) by mouth 2 (two) times daily with a meal.   olmesartan (BENICAR) 40 MG tablet TAKE 1 TABLET BY MOUTH EVERY DAY   omeprazole (PRILOSEC) 40 MG capsule TAKE 1 CAPSULE BY MOUTH EVERY DAY   ONETOUCH ULTRA test strip USE TO TEST BLOOD SUGAR 4 TIMES DAILY AS DIRECTED   rosuvastatin (CRESTOR) 10 MG tablet TAKE 1 TABLET BY MOUTH EVERY DAY   tadalafil (CIALIS) 20 MG tablet Take 1 tablet (20 mg total) by mouth daily as needed for erectile dysfunction.   tirzepatide (MOUNJARO) 10 MG/0.5ML Pen INJECT 10 MG UNDER THE SKIN ONCE WEEKLY   vitamin B-12 (CYANOCOBALAMIN) 1000 MCG tablet Take 1,000 mcg by mouth daily.   No current facility-administered medications for this visit. (Other)      REVIEW OF SYSTEMS: ROS   Negative for: Constitutional, Gastrointestinal, Neurological, Skin, Genitourinary, Musculoskeletal, HENT, Endocrine, Cardiovascular, Eyes, Respiratory, Psychiatric, Allergic/Imm, Heme/Lymph Last edited by Morene Rankins, CMA on 04/02/2022  8:41 AM.       ALLERGIES Allergies  Allergen Reactions   Codeine     Anxious  Lipitor [Atorvastatin]     Muscle pain     PAST MEDICAL HISTORY Past Medical History:  Diagnosis Date   Allergy    allegic rhinitis   Diabetes mellitus    type II   Esophageal stricture    GERD (gastroesophageal reflux disease)    History of hernia repair    as a baby   Hyperlipidemia    Hyperplastic polyp of intestine 2010   Hypertension    Proliferative diabetic retinopathy of left eye ([place]) 03/31/2020   This is active as a patient still has tufts of the anterior segment neovascularization, rubeosis in the left eye every region of capillary nonperfusion should be treated with PRP.   S/P ear surgery,  follow-up exam    for drainage   Vitreous hemorrhage of right eye ([place]) 12/24/2019   Past Surgical History:  Procedure Laterality Date   BIOPSY  12/07/2021   Procedure: BIOPSY;  Surgeon: Ladene Artist, MD;  Location: [facility];  Service: Gastroenterology;;   CATARACT EXTRACTION     COLONOSCOPY  03/07/2016   COLONOSCOPY WITH PROPOFOL N/A 12/07/2021   Procedure: COLONOSCOPY WITH PROPOFOL;  Surgeon: Ladene Artist, MD;  Location: [facility];  Service: Gastroenterology;  Laterality: N/A;   ESOPHAGOGASTRODUODENOSCOPY  04/20/2009   stricture/GERD   ESOPHAGOGASTRODUODENOSCOPY (EGD) WITH PROPOFOL N/A 12/07/2021   Procedure: ESOPHAGOGASTRODUODENOSCOPY (EGD) WITH PROPOFOL;  Surgeon: Ladene Artist, MD;  Location: [facility];  Service: Gastroenterology;  Laterality: N/A;   EYE SURGERY     HERNIA REPAIR     age 55-midline   INNER EAR SURGERY  08/20/1986   went in behind [facility]   POLYPECTOMY     SAVORY DILATION N/A 12/07/2021   Procedure: SAVORY DILATION;  Surgeon: Ladene Artist, MD;  Location: [facility];  Service: Gastroenterology;  Laterality: N/A;   SHOULDER ARTHROSCOPY WITH ROTATOR CUFF REPAIR AND SUBACROMIAL DECOMPRESSION Right 09/29/2012   Procedure: SHOULDER ARTHROSCOPY WITH ROTATOR CUFF REPAIR AND SUBACROMIAL DECOMPRESSION;  Surgeon: Nita Sells, MD;  Location: [facility];  Service: Orthopedics;  Laterality: Right;  Right shoulder arthroscopy with subacromial decompression and distal clavicle excision & Debridement of Labrial Tear   SHOULDER SURGERY Left    x2   UPPER GASTROINTESTINAL ENDOSCOPY      FAMILY HISTORY Family History  Problem Relation Age of Onset   Diabetes Mother    Colon cancer Father 26   Diabetes Other    Colon polyps Brother    Diabetes Maternal Aunt    Cancer Cousin        breast cancer    SOCIAL HISTORY Social History   Tobacco Use   Smoking status: Never   Smokeless  tobacco: Never  Vaping Use   Vaping Use: Never used  Substance Use Topics   Alcohol use: Yes    Alcohol/week: 12.0 standard drinks of alcohol    Types: 12 Cans of beer per week   Drug use: No         OPHTHALMIC EXAM:  Base Eye Exam     Visual Acuity (ETDRS)       Right Left   Dist [place] 20/20 20/20    Correction: Glasses         Tonometry (Tonopen, 8:44 AM)       Right Left   Pressure 14 10         Pupils       Shape   Right Round  Left          Visual Fields       Left Right    Full Full         Extraocular Movement       Right Left    Ortho Ortho    -- -- --  --  --  -- -- --   -- -- --  --  --  -- -- --           Neuro/Psych     Oriented x3: Yes   Mood/Affect: Normal         Dilation     Left eye: 2.5% Phenylephrine, 1.0% Mydriacyl @ 8:43 AM           Slit Lamp and Fundus Exam     External Exam       Right Left   External Normal Normal         Slit Lamp Exam       Right Left   Lids/Lashes Normal Normal   Conjunctiva/Sclera White and quiet White and quiet   Cornea Clear Clear   Anterior Chamber Deep and quiet Deep and quiet   Iris Round and reactive Round and reactive, visible remnants of iris neovascularization, undilated   Lens Posterior chamber intraocular lens,, , 1+ Posterior capsular opacification 2+ Nuclear sclerosis   Anterior Vitreous Normal Normal         Fundus Exam       Right Left   Posterior Vitreous  no posterior vitreous detachment   Disc  Normal   C/D Ratio  0.2   Macula  Microaneurysms, no macular thickening, no clinically significant macular edema   Vessels  Proliferative diabetic retinopathy, with good PRP yet there are a temporal window anterior to the equator in the left eye in addition to the region temporal to the macula in the left eye with large regions of capillary nonperfusion which might be triggering the anterior segment neovascularization   Periphery  Good PRP and  attached, some room anterior in the temporal retina for more PRP if disease reactivates, small NVE superior            IMAGING AND PROCEDURES  Imaging and Procedures for 04/02/22  OCT, Retina - OU - Both Eyes       Right Eye Quality was good. Scan locations included subfoveal. Central Foveal Thickness: 363. Progression has been stable. Findings include cystoid macular edema, epiretinal membrane.   Left Eye Quality was good. Scan locations included subfoveal. Central Foveal Thickness: 303. Progression has worsened. Findings include abnormal foveal contour, cystoid macular edema.   Notes CSME OD, improved CSME OD at 5 days post most recent injection, incidental minor ERM temporally, now worse at 5 weeks and 5 days post Avastin.  OS, much improved OS today post Eylea at 5 weeks, with low-lying PVD, no longer VMA OS, repeat Eylea OS today and extend interval examination next      Intravitreal Injection, Pharmacologic Agent - OS - Left Eye       Time Out 04/02/2022. 9:31 AM. Confirmed correct patient, procedure, site, and patient consented.   Anesthesia Topical anesthesia was used. Anesthetic medications included Lidocaine 4%.   Procedure Preparation included 5% betadine to ocular surface, 10% betadine to eyelids, Tobramycin 0.3%. A 30 gauge needle was used.   Injection: 2 mg aflibercept 2 MG/0.05ML   Route: Intravitreal, Site: Left Eye   NDC: [id number], Lot: [id number], Expiration date: 04/22/2023, Waste: 0 mL  Post-op Post injection exam found visual acuity of at least counting fingers. The patient tolerated the procedure well. There were no complications. The patient received written and verbal post procedure care education. Post injection medications were not given.              ASSESSMENT/PLAN:  Proliferative diabetic retinopathy of left eye with macular edema associated with type 2 diabetes mellitus (HCC) OS, improved overall at 5-week interval post Eylea.   Repeat Eylea injection OS today  Diabetic macular edema of right eye with proliferative retinopathy associated with type 2 diabetes mellitus ([place]) New onset symptoms yesterday, for some 5 weeks after most recent injection of Avastin.  We will need to repeat injection as scheduled August 30 will likely change to [facility] OD     ICD-10-CM   1. Proliferative diabetic retinopathy of left eye with macular edema associated with type 2 diabetes mellitus (HCC)  Q76.1950 OCT, Retina - OU - Both Eyes    Intravitreal Injection, Pharmacologic Agent - OS - Left Eye    aflibercept (EYLEA) SOLN 2 mg    2. Diabetic macular edema of right eye with proliferative retinopathy associated with type 2 diabetes mellitus ([place])  E11.3511       1.  OU with PDR quiescent.  However CSME OD recurrent 5 weeks 5 days post use of Avastin.  Repeat injection evaluation OD next as scheduled August 30, likely change to [facility]  2.  OS today stable CSME post Eylea, at 5 weeks repeat injection today reevaluate in 6 weeks OS  3.  Ophthalmic Meds Ordered this visit:  Meds ordered this encounter  Medications   aflibercept (EYLEA) SOLN 2 mg       Return in about 6 weeks (around 05/14/2022) for dilate, OS, EYLEA OCT,, and as scheduled August 30, OD, [facility] OCT, this is a change.  There are no Patient Instructions on file for this visit.   Explained the diagnoses, plan, and follow up with the patient and they expressed understanding.  Patient expressed understanding of the importance of proper follow up care.   Clent Demark Kelli Robeck M.D. Diseases & Surgery of the Retina and Vitreous Retina & Diabetic [place] 04/02/22     Abbreviations: M myopia (nearsighted); A astigmatism; H hyperopia (farsighted); P presbyopia; Mrx spectacle prescription;  CTL contact lenses; OD right eye; OS left eye; OU both eyes  XT exotropia; ET esotropia; PEK punctate epithelial keratitis; PEE punctate epithelial erosions; DES dry eye syndrome; MGD  meibomian gland dysfunction; ATs artificial tears; PFAT's preservative free artificial tears; [place] nuclear sclerotic cataract; PSC posterior subcapsular cataract; ERM epi-retinal membrane; PVD posterior vitreous detachment; RD retinal detachment; DM diabetes mellitus; DR diabetic retinopathy; NPDR non-proliferative diabetic retinopathy; PDR proliferative diabetic retinopathy; CSME clinically significant macular edema; DME diabetic macular edema; dbh dot blot hemorrhages; CWS cotton wool spot; POAG primary open angle glaucoma; C/D cup-to-disc ratio; HVF humphrey visual field; GVF goldmann visual field; OCT optical coherence tomography; IOP intraocular pressure; BRVO Branch retinal vein occlusion; CRVO central retinal vein occlusion; CRAO central retinal artery occlusion; BRAO branch retinal artery occlusion; RT retinal tear; SB scleral buckle; PPV pars plana vitrectomy; VH Vitreous hemorrhage; PRP panretinal laser photocoagulation; IVK intravitreal kenalog; VMT vitreomacular traction; MH Macular hole;  NVD neovascularization of the disc; NVE neovascularization elsewhere; AREDS age related eye disease study; ARMD age related macular degeneration; POAG primary open angle glaucoma; EBMD epithelial/anterior basement membrane dystrophy; ACIOL anterior chamber intraocular lens; IOL intraocular lens; PCIOL posterior chamber intraocular lens; Phaco/IOL  phacoemulsification with intraocular lens placement; [place] photorefractive keratectomy; LASIK laser assisted in situ keratomileusis; HTN hypertension; DM diabetes mellitus; COPD chronic obstructive pulmonary disease

## 2022-04-02 NOTE — Assessment & Plan Note (Signed)
New onset symptoms yesterday, for some 5 weeks after most recent injection of Avastin.  We will need to repeat injection as scheduled August 30 will likely change to [facility] OD

## 2022-04-02 NOTE — Assessment & Plan Note (Signed)
OS, improved overall at 5-week interval post Eylea.  Repeat Eylea injection OS today

## 2022-04-10 ENCOUNTER — Ambulatory Visit: Payer: 59 | Admitting: Primary Care

## 2022-04-12 ENCOUNTER — Ambulatory Visit (INDEPENDENT_AMBULATORY_CARE_PROVIDER_SITE_OTHER): Payer: 59 | Admitting: Primary Care

## 2022-04-12 ENCOUNTER — Encounter: Payer: Self-pay | Admitting: Primary Care

## 2022-04-12 VITALS — BP 130/64 | HR 74 | Temp 98.1°F | Ht 68.0 in | Wt 224.2 lb

## 2022-04-12 DIAGNOSIS — G4733 Obstructive sleep apnea (adult) (pediatric): Secondary | ICD-10-CM

## 2022-04-12 NOTE — Progress Notes (Signed)
$'@Patient'I$  ID: Mitchell Palmer, male    DOB: 1966-07-25, 56 y.o.   MRN: [id number]  Chief Complaint  Patient presents with   Follow-up    F/u HST    Referring provider: Tower, Wynelle Fanny, MD  HPI: 56 year-old male, never smoked.  Past medical history significant for hypertension, allergic rhinitis, GERD, type 2 diabetes, obesity.  Previous [facility] pulmonary encounter:  12/29/2021 Patient presents today for sleep consult. He was referred by Dr. Deloria Lair for possible sleep apnea d/t diabetic retinopathy He has symptoms of restless sleep, loud snoring, wakes up gasping for air and daytime fatigue. 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 Typical bedtime is either 7:30am or 9pm. It can take him 30-60 mins to fall asleep. He wakes up 3-4 times a night. He starts his day at either 4:30pm and 9am. No previous sleep study. He is not on CPAP or oxygen. Epworh 2. Denies narcolepsy, cataplexy or sleep walking.   Sleep questionnaire Symptoms-   Restless sleep, loud snoring, wakes up gasping for air Prior sleep study- None  Bedtime- 7:20am or 9pm  Time to fall asleep- 30-60 mins  Nocturnal awakenings- 3-4 times  Out of bed/start of day- 4:30pm or 9am Weight changes- fluctuates 5-10lbs  Do you operate heavy machinery- Yes  Do you currently wear CPAP- No Do you current wear oxygen- No Epworth- 2  04/12/2022 - Interim hx  Patient is here today to review sleep study results.  Patient has symptoms of restless sleep, loud snoring and waking up gasping for air.  HST on 03/15/22 showed moderate OSA, AHI 16.6/hr with SpO2 low 48% (baseline 89%).  Due to severity of sleep apnea and oxygen desaturation recommending patient be started on CPAP.  Patient is in agreement with plan.   Allergies  Allergen Reactions   Codeine     Anxious   Lipitor [Atorvastatin]     Muscle pain     Immunization History  Administered Date(s) Administered   Hep A / Hep B 03/26/2018, 04/28/2018,  09/30/2018   Influenza Split 06/17/2012   Influenza Whole 06/20/2010   Influenza,inj,Quad PF,6+ Mos 06/03/2013, 06/30/2014, 07/22/2015, 05/15/2016, 05/10/2017, 04/28/2018, 05/28/2019, 05/31/2020, 05/24/2021   PFIZER(Purple Top)SARS-COV-2 Vaccination 03/16/2020, 04/08/2020   Pneumococcal Polysaccharide-23 10/12/2011, 02/27/2017   Td 08/21/1999, 09/14/2020   Tdap 01/12/2011    Past Medical History:  Diagnosis Date   Allergy    allegic rhinitis   Diabetes mellitus    type II   Esophageal stricture    GERD (gastroesophageal reflux disease)    History of hernia repair    as a baby   Hyperlipidemia    Hyperplastic polyp of intestine 2010   Hypertension    OSA (obstructive sleep apnea) 04/18/2022   Proliferative diabetic retinopathy of left eye ([place]) 03/31/2020   This is active as a patient still has tufts of the anterior segment neovascularization, rubeosis in the left eye every region of capillary nonperfusion should be treated with PRP.   S/P ear surgery, follow-up exam    for drainage   Vitreous hemorrhage of right eye ([place]) 12/24/2019    Tobacco History: Social History   Tobacco Use  Smoking Status Never  Smokeless Tobacco Never   Counseling given: Not Answered   Outpatient Medications Prior to Visit  Medication Sig Dispense Refill   B-D UF III MINI PEN NEEDLES 31G X 5 MM MISC USE AS DIRECTED 4 TIMES A DAY 400 each 3   cetirizine (ZYRTEC) 10 MG  tablet Take 10 mg by mouth daily.     Cholecalciferol (VITAMIN D) 50 MCG (2000 UT) tablet Take 2,000 Units by mouth daily.     Continuous Blood Gluc Sensor (FREESTYLE LIBRE 3 SENSOR) MISC 1 applicator by Does not apply route every 14 (fourteen) days. 6 each 3   cyclobenzaprine (FLEXERIL) 10 MG tablet Take 1 tablet (10 mg total) by mouth 3 (three) times daily as needed. 30 tablet 3   fluticasone (FLONASE) 50 MCG/ACT nasal spray Place 2 sprays into both nostrils daily as needed for allergies or rhinitis. 16 mL 3   insulin glargine  (LANTUS SOLOSTAR) 100 UNIT/ML Solostar Pen INJECT 42 UNITS IN THE MORNING AND 50 UNITS IN THE EVENING 60 mL 3   insulin lispro (HUMALOG KWIKPEN) 100 UNIT/ML KwikPen INJECT 15-25 UNITS INTO THE SKIN 3 TIMES DAILY W/ MEALS AND 10-12 UNITS INTO THE SKIN W/ SNACKS 60 mL 3   JARDIANCE 25 MG TABS tablet TAKE 1 TABLET BY MOUTH DAILY BEFORE BREAKFAST. 30 tablet 2   levothyroxine (SYNTHROID) 75 MCG tablet Take 1 tablet (75 mcg total) by mouth daily before breakfast. 90 tablet 3   metFORMIN (GLUCOPHAGE) 1000 MG tablet Take 1 tablet (1,000 mg total) by mouth 2 (two) times daily with a meal. 60 tablet 11   olmesartan (BENICAR) 40 MG tablet TAKE 1 TABLET BY MOUTH EVERY DAY 90 tablet 2   omeprazole (PRILOSEC) 40 MG capsule TAKE 1 CAPSULE BY MOUTH EVERY DAY 30 capsule 3   ONETOUCH ULTRA test strip USE TO TEST BLOOD SUGAR 4 TIMES DAILY AS DIRECTED 400 strip 11   rosuvastatin (CRESTOR) 10 MG tablet TAKE 1 TABLET BY MOUTH EVERY DAY 30 tablet 11   tadalafil (CIALIS) 20 MG tablet Take 1 tablet (20 mg total) by mouth daily as needed for erectile dysfunction. 4 tablet 5   tirzepatide (MOUNJARO) 10 MG/0.5ML Pen INJECT 10 MG UNDER THE SKIN ONCE WEEKLY 6 mL 3   vitamin B-12 (CYANOCOBALAMIN) 1000 MCG tablet Take 1,000 mcg by mouth daily.     No facility-administered medications prior to visit.   Review of Systems  Review of Systems  Constitutional:  Positive for fatigue.  Respiratory:  Positive for apnea.   Psychiatric/Behavioral:  Positive for sleep disturbance.      Physical Exam  BP 130/64 (BP Location: Left Arm, Cuff Size: Normal)   Pulse 74   Temp 98.1 F (36.7 C) (Temporal)   Ht '5\' 8"'$  (1.727 m)   Wt 224 lb 3.2 oz (101.7 kg)   SpO2 97%   BMI 34.09 kg/m  Physical Exam Constitutional:      Appearance: Normal appearance.  HENT:     Head: Normocephalic and atraumatic.     Mouth/Throat:     Mouth: Mucous membranes are moist.     Pharynx: Oropharynx is clear.  Cardiovascular:     Rate and Rhythm:  Normal rate and regular rhythm.  Neurological:     General: No focal deficit present.     Mental Status: He is alert and oriented to person, place, and time. Mental status is at baseline.  Psychiatric:        Mood and Affect: Mood normal.        Behavior: Behavior normal.        Thought Content: Thought content normal.        Judgment: Judgment normal.      Lab Results:  CBC    Component Value Date/Time   WBC 7.4 10/27/2021 0745  RBC 4.63 10/27/2021 0745   HGB 16.2 10/27/2021 0745   HCT 45.7 10/27/2021 0745   PLT 244.0 10/27/2021 0745   MCV 98.9 10/27/2021 0745   MCHC 35.4 10/27/2021 0745   RDW 12.7 10/27/2021 0745   LYMPHSABS 1.3 10/27/2021 0745   MONOABS 0.6 10/27/2021 0745   EOSABS 0.3 10/27/2021 0745   BASOSABS 0.0 10/27/2021 0745    BMET    Component Value Date/Time   NA 140 10/27/2021 0745   K 5.0 10/27/2021 0745   CL 101 10/27/2021 0745   CO2 29 10/27/2021 0745   GLUCOSE 178 (H) 10/27/2021 0745   BUN 14 10/27/2021 0745   CREATININE 1.12 10/27/2021 0745   CALCIUM 9.3 10/27/2021 0745   GFRNONAA >90 09/23/2012 1200   GFRAA >90 09/23/2012 1200    BNP No results found for: "BNP"  ProBNP No results found for: "PROBNP"  Imaging: Intravitreal Injection, Pharmacologic Agent - OD - Right Eye  Result Date: 04/18/2022 Time Out 04/18/2022. 10:42 AM. Confirmed correct patient, procedure, site, and patient consented. Anesthesia Topical anesthesia was used. Anesthetic medications included Lidocaine 4%. Procedure Preparation included 5% betadine to ocular surface, 10% betadine to eyelids. A 30 gauge needle was used. Injection: 2 mg aflibercept 2 MG/0.05ML   Route: Intravitreal, Site: Right Eye   NDC: A3590391, Lot: [id number], Expiration date: 11/19/2022, Waste: 0 mL Post-op Post injection exam found visual acuity of at least counting fingers. The patient tolerated the procedure well. There were no complications. The patient received written and verbal post procedure  care education. Post injection medications included ocuflox.   OCT, Retina - OU - Both Eyes  Result Date: 04/18/2022 Right Eye Quality was good. Scan locations included subfoveal. Central Foveal Thickness: 348. Progression has improved. Findings include cystoid macular edema, epiretinal membrane. Left Eye Quality was good. Scan locations included subfoveal. Central Foveal Thickness: 298. Progression has been stable. Findings include abnormal foveal contour, cystoid macular edema. Notes CSME OD, improved CSME OD at 8 weeks post most recent injection.  Repeat injection today OS, much improved OS today post Eylea at 2 weeks, with low-lying PVD, no longer VMA OS, repeat evaluation OS next as scheduled  OCT, Retina - OU - Both Eyes  Result Date: 04/02/2022 Right Eye Quality was good. Scan locations included subfoveal. Central Foveal Thickness: 363. Progression has been stable. Findings include cystoid macular edema, epiretinal membrane. Left Eye Quality was good. Scan locations included subfoveal. Central Foveal Thickness: 303. Progression has worsened. Findings include abnormal foveal contour, cystoid macular edema. Notes CSME OD, improved CSME OD at 5 days post most recent injection, incidental minor ERM temporally, now worse at 5 weeks and 5 days post Avastin. OS, much improved OS today post Eylea at 5 weeks, with low-lying PVD, no longer VMA OS, repeat Eylea OS today and extend interval examination next  Intravitreal Injection, Pharmacologic Agent - OS - Left Eye  Result Date: 04/02/2022 Time Out 04/02/2022. 9:31 AM. Confirmed correct patient, procedure, site, and patient consented. Anesthesia Topical anesthesia was used. Anesthetic medications included Lidocaine 4%. Procedure Preparation included 5% betadine to ocular surface, 10% betadine to eyelids, Tobramycin 0.3%. A 30 gauge needle was used. Injection: 2 mg aflibercept 2 MG/0.05ML   Route: Intravitreal, Site: Left Eye   NDC: A3590391, Lot:  [id number], Expiration date: 04/22/2023, Waste: 0 mL Post-op Post injection exam found visual acuity of at least counting fingers. The patient tolerated the procedure well. There were no complications. The patient received written and verbal post procedure care education. Post  injection medications were not given.     Assessment & Plan:   OSA (obstructive sleep apnea) - Patient has symptoms of restless sleep, loud snoring and waking up gasping for air. HST 03/15/22 showed moderate obstructive sleep apnea, AHI 16.6/hour. Reviewed treatment options, recommending patient be started on CPAP d.t clinical symptoms and moderate severity of OSA and severe oxygen desaturation. Patient in agreement with plan. Order placed for auto CPAP 5-15cm h20 with mask of choice. Encourage patient aim to wear CPAP every night for 4-6 hours or longer. Continue to work on weight loss effort.Recommend getting ONO once established on therapy to ensure that he does not need oxygen. FU 31-90 days after starting CPAP for compliance check.      Martyn Ehrich, NP 04/19/2022

## 2022-04-12 NOTE — Patient Instructions (Signed)
Home sleep study showed evidence of moderate obstructive sleep apnea with severe oxygen desaturations Recommending you be started on CPAP Once you are on CPAP we will need to check an overnight oximetry test to ensure that you do not need oxygen as well  Recommendations Aim to wear CPAP every night for minimum 4 to 6 hours or longer Do not drive if experiencing excessive daytime sleepiness or fatigue Focus on side sleeping position or elevate head of bed 30 degrees  Orders New CPAP auto 5-15 with mask of choice  Follow-up 1-3 months with Beth NP for compliance check   CPAP and BIPAP Information CPAP and BIPAP are methods that use air pressure to keep your airways open and to help you breathe well. CPAP and BIPAP use different amounts of pressure. Your health care provider will tell you whether CPAP or BIPAP would be more helpful for you. CPAP stands for "continuous positive airway pressure." With CPAP, the amount of pressure stays the same while you breathe in (inhale) and out (exhale). BIPAP stands for "bi-level positive airway pressure." With BIPAP, the amount of pressure will be higher when you inhale and lower when you exhale. This allows you to take larger breaths. CPAP or BIPAP may be used in the hospital, or your health care provider may want you to use it at home. You may need to have a sleep study before your health care provider can order a machine for you to use at home. What are the advantages? CPAP or BIPAP can be helpful if you have: Sleep apnea. Chronic obstructive pulmonary disease (COPD). Heart failure. Medical conditions that cause muscle weakness, including muscular dystrophy or amyotrophic lateral sclerosis (ALS). Other problems that cause breathing to be shallow, weak, abnormal, or difficult. CPAP and BIPAP are most commonly used for obstructive sleep apnea (OSA) to keep the airways from collapsing when the muscles relax during sleep. What are the risks? Generally,  this is a safe treatment. However, problems may occur, including: Irritated skin or skin sores if the mask does not fit properly. Dry or stuffy nose or nosebleeds. Dry mouth. Feeling gassy or bloated. Sinus or lung infection if the equipment is not cleaned properly. When should CPAP or BIPAP be used? In most cases, the mask only needs to be worn during sleep. Generally, the mask needs to be worn throughout the night and during any daytime naps. People with certain medical conditions may also need to wear the mask at other times, such as when they are awake. Follow instructions from your health care provider about when to use the machine. What happens during CPAP or BIPAP?  Both CPAP and BIPAP are provided by a small machine with a flexible plastic tube that attaches to a plastic mask that you wear. Air is blown through the mask into your nose or mouth. The amount of pressure that is used to blow the air can be adjusted on the machine. Your health care provider will set the pressure setting and help you find the best mask for you. Tips for using the mask Because the mask needs to be snug, some people feel trapped or closed-in (claustrophobic) when first using the mask. If you feel this way, you may need to get used to the mask. One way to do this is to hold the mask loosely over your nose or mouth and then gradually apply the mask more snugly. You can also gradually increase the amount of time that you use the mask. Masks are available  in various types and sizes. If your mask does not fit well, talk with your health care provider about getting a different one. Some common types of masks include: Full face masks, which fit over the mouth and nose. Nasal masks, which fit over the nose. Nasal pillow or prong masks, which fit into the nostrils. If you are using a mask that fits over your nose and you tend to breathe through your mouth, a chin strap may be applied to help keep your mouth closed. Use a  skin barrier to protect your skin as told by your health care provider. Some CPAP and BIPAP machines have alarms that may sound if the mask comes off or develops a leak. If you have trouble with the mask, it is very important that you talk with your health care provider about finding a way to make the mask easier to tolerate. Do not stop using the mask. There could be a negative impact on your health if you stop using the mask. Tips for using the machine Place your CPAP or BIPAP machine on a secure table or stand near an electrical outlet. Know where the on/off switch is on the machine. Follow instructions from your health care provider about how to set the pressure on your machine and when you should use it. Do not eat or drink while the CPAP or BIPAP machine is on. Food or fluids could get pushed into your lungs by the pressure of the CPAP or BIPAP. For home use, CPAP and BIPAP machines can be rented or purchased through home health care companies. Many different brands of machines are available. Renting a machine before purchasing may help you find out which particular machine works well for you. Your health insurance company may also decide which machine you may get. Keep the CPAP or BIPAP machine and attachments clean. Ask your health care provider for specific instructions. Check the humidifier if you have a dry stuffy nose or nosebleeds. Make sure it is working correctly. Follow these instructions at home: Take over-the-counter and prescription medicines only as told by your health care provider. Ask if you can take sinus medicine if your sinuses are blocked. Do not use any products that contain nicotine or tobacco. These products include cigarettes, chewing tobacco, and vaping devices, such as e-cigarettes. If you need help quitting, ask your health care provider. Keep all follow-up visits. This is important. Contact a health care provider if: You have redness or pressure sores on your head,  face, mouth, or nose from the mask or head gear. You have trouble using the CPAP or BIPAP machine. You cannot tolerate wearing the CPAP or BIPAP mask. Someone tells you that you snore even when wearing your CPAP or BIPAP. Get help right away if: You have trouble breathing. You feel confused. Summary CPAP and BIPAP are methods that use air pressure to keep your airways open and to help you breathe well. If you have trouble with the mask, it is very important that you talk with your health care provider about finding a way to make the mask easier to tolerate. Do not stop using the mask. There could be a negative impact to your health if you stop using the mask. Follow instructions from your health care provider about when to use the machine. This information is not intended to replace advice given to you by your health care provider. Make sure you discuss any questions you have with your health care provider. Document Revised: 03/15/2021 Document Reviewed:  07/15/2020 Elsevier Patient Education  [employer].

## 2022-04-18 ENCOUNTER — Encounter (INDEPENDENT_AMBULATORY_CARE_PROVIDER_SITE_OTHER): Payer: Self-pay | Admitting: Ophthalmology

## 2022-04-18 ENCOUNTER — Encounter (INDEPENDENT_AMBULATORY_CARE_PROVIDER_SITE_OTHER): Payer: 59 | Admitting: Ophthalmology

## 2022-04-18 ENCOUNTER — Ambulatory Visit (INDEPENDENT_AMBULATORY_CARE_PROVIDER_SITE_OTHER): Payer: 59 | Admitting: Ophthalmology

## 2022-04-18 DIAGNOSIS — E113512 Type 2 diabetes mellitus with proliferative diabetic retinopathy with macular edema, left eye: Secondary | ICD-10-CM

## 2022-04-18 DIAGNOSIS — G4733 Obstructive sleep apnea (adult) (pediatric): Secondary | ICD-10-CM | POA: Diagnosis not present

## 2022-04-18 DIAGNOSIS — E113511 Type 2 diabetes mellitus with proliferative diabetic retinopathy with macular edema, right eye: Secondary | ICD-10-CM

## 2022-04-18 HISTORY — DX: Obstructive sleep apnea (adult) (pediatric): G47.33

## 2022-04-18 MED ORDER — AFLIBERCEPT 2MG/0.05ML IZ SOLN FOR KALEIDOSCOPE
2.0000 mg | INTRAVITREAL | Status: AC | PRN
  Administered 2022-04-18: 2 mg via INTRAVITREAL

## 2022-04-18 NOTE — Assessment & Plan Note (Signed)
Diagnosis confirmed.  Patient has machinery ordered, for use of CPAP nightly.  And eager to begin use  We will protect macular condition from nightly hypoxic damage

## 2022-04-18 NOTE — Assessment & Plan Note (Signed)
The nature of diabetic macular edema was discussed with the patient. Treatment options were outlined including medical therapy, laser & vitrectomy. The use of injectable medications reviewed, including Avastin, Lucentis, and Eylea. Periodic injections into the eye are likely to resolve diabetic macular edema (swelling in the center of vision). Initially, injections are delivered are delivered every 4-6 weeks, and the interval extended as the condition improves. On average, 8-9 injections the first year, and 5 in year 2. Improvement in the condition most often improves on medical therapy. Occasional use of focal laser is also recommended for residual macular edema (swelling). Excellent control of blood glucose and blood pressure are encouraged under the care of a primary physician or endocrinologist. Similarly, attempts to maintain serum cholesterol, low density lipoproteins, and high-density lipoproteins in a favorable range were recommended.   OD, improved overall but yet still temporal thickening to the fovea.  This is likely to improve as patient is likely to commence therapy for untreated sleep apnea in the near future once machinery is arrived.  Nonetheless at this time at 8-week and we will repeat Eylea today and maintain 8 to 10-week interval right eye

## 2022-04-18 NOTE — Assessment & Plan Note (Signed)
OS confirmed improvement post injection antivegF of CSME.  Currently 2 weeks post most recent injection follow-up as scheduled within 1 month

## 2022-04-18 NOTE — Progress Notes (Signed)
04/18/2022     CHIEF COMPLAINT Patient presents for  Chief Complaint  Patient presents with   Diabetic Retinopathy with Macular Edema      HISTORY OF PRESENT ILLNESS: Mitchell Palmer is a 56 y.o. male who presents to the clinic today for:   HPI   Post injection OD  8 WEEKS FOR DILATE OD, EYLEA, OCT. Pt stated no changes in vision. However reports same "wavy lines".  Last edited by Hurman Horn, MD on 04/18/2022 10:41 AM.      Referring physician: Tower, Wynelle Fanny, MD [place],  [place] [address]  HISTORICAL INFORMATION:   Selected notes from the MEDICAL RECORD NUMBER    Lab Results  Component Value Date   HGBA1C 7.3 (A) 03/21/2022     CURRENT MEDICATIONS: No current outpatient medications on file. (Ophthalmic Drugs)   No current facility-administered medications for this visit. (Ophthalmic Drugs)   Current Outpatient Medications (Other)  Medication Sig   B-D UF III MINI PEN NEEDLES 31G X 5 MM MISC USE AS DIRECTED 4 TIMES A DAY   cetirizine (ZYRTEC) 10 MG tablet Take 10 mg by mouth daily.   Cholecalciferol (VITAMIN D) 50 MCG (2000 UT) tablet Take 2,000 Units by mouth daily.   Continuous Blood Gluc Sensor (FREESTYLE LIBRE 3 SENSOR) MISC 1 applicator by Does not apply route every 14 (fourteen) days.   cyclobenzaprine (FLEXERIL) 10 MG tablet Take 1 tablet (10 mg total) by mouth 3 (three) times daily as needed.   fluticasone (FLONASE) 50 MCG/ACT nasal spray Place 2 sprays into both nostrils daily as needed for allergies or rhinitis.   insulin glargine (LANTUS SOLOSTAR) 100 UNIT/ML Solostar Pen INJECT 42 UNITS IN THE MORNING AND 50 UNITS IN THE EVENING   insulin lispro (HUMALOG KWIKPEN) 100 UNIT/ML KwikPen INJECT 15-25 UNITS INTO THE SKIN 3 TIMES DAILY W/ MEALS AND 10-12 UNITS INTO THE SKIN W/ SNACKS   JARDIANCE 25 MG TABS tablet TAKE 1 TABLET BY MOUTH DAILY BEFORE BREAKFAST.   levothyroxine (SYNTHROID) 75 MCG tablet Take 1 tablet (75 mcg total) by  mouth daily before breakfast.   metFORMIN (GLUCOPHAGE) 1000 MG tablet Take 1 tablet (1,000 mg total) by mouth 2 (two) times daily with a meal.   olmesartan (BENICAR) 40 MG tablet TAKE 1 TABLET BY MOUTH EVERY DAY   omeprazole (PRILOSEC) 40 MG capsule TAKE 1 CAPSULE BY MOUTH EVERY DAY   ONETOUCH ULTRA test strip USE TO TEST BLOOD SUGAR 4 TIMES DAILY AS DIRECTED   rosuvastatin (CRESTOR) 10 MG tablet TAKE 1 TABLET BY MOUTH EVERY DAY   tadalafil (CIALIS) 20 MG tablet Take 1 tablet (20 mg total) by mouth daily as needed for erectile dysfunction.   tirzepatide (MOUNJARO) 10 MG/0.5ML Pen INJECT 10 MG UNDER THE SKIN ONCE WEEKLY   vitamin B-12 (CYANOCOBALAMIN) 1000 MCG tablet Take 1,000 mcg by mouth daily.   No current facility-administered medications for this visit. (Other)      REVIEW OF SYSTEMS: ROS   Negative for: Constitutional, Gastrointestinal, Neurological, Skin, Genitourinary, Musculoskeletal, HENT, Endocrine, Cardiovascular, Eyes, Respiratory, Psychiatric, Allergic/Imm, Heme/Lymph Last edited by Silvestre Moment on 04/18/2022 10:01 AM.       ALLERGIES Allergies  Allergen Reactions   Codeine     Anxious   Lipitor [Atorvastatin]     Muscle pain     PAST MEDICAL HISTORY Past Medical History:  Diagnosis Date   Allergy    allegic rhinitis   Diabetes mellitus    type  II   Esophageal stricture    GERD (gastroesophageal reflux disease)    History of hernia repair    as a baby   Hyperlipidemia    Hyperplastic polyp of intestine 2010   Hypertension    OSA (obstructive sleep apnea) 04/18/2022   Proliferative diabetic retinopathy of left eye ([place]) 03/31/2020   This is active as a patient still has tufts of the anterior segment neovascularization, rubeosis in the left eye every region of capillary nonperfusion should be treated with PRP.   S/P ear surgery, follow-up exam    for drainage   Vitreous hemorrhage of right eye ([place]) 12/24/2019   Past Surgical History:  Procedure Laterality Date    BIOPSY  12/07/2021   Procedure: BIOPSY;  Surgeon: Ladene Artist, MD;  Location: [facility];  Service: Gastroenterology;;   CATARACT EXTRACTION     COLONOSCOPY  03/07/2016   COLONOSCOPY WITH PROPOFOL N/A 12/07/2021   Procedure: COLONOSCOPY WITH PROPOFOL;  Surgeon: Ladene Artist, MD;  Location: [facility];  Service: Gastroenterology;  Laterality: N/A;   ESOPHAGOGASTRODUODENOSCOPY  04/20/2009   stricture/GERD   ESOPHAGOGASTRODUODENOSCOPY (EGD) WITH PROPOFOL N/A 12/07/2021   Procedure: ESOPHAGOGASTRODUODENOSCOPY (EGD) WITH PROPOFOL;  Surgeon: Ladene Artist, MD;  Location: [facility];  Service: Gastroenterology;  Laterality: N/A;   EYE SURGERY     HERNIA REPAIR     age 45-midline   INNER EAR SURGERY  08/20/1986   went in behind [facility]   POLYPECTOMY     SAVORY DILATION N/A 12/07/2021   Procedure: SAVORY DILATION;  Surgeon: Ladene Artist, MD;  Location: [facility];  Service: Gastroenterology;  Laterality: N/A;   SHOULDER ARTHROSCOPY WITH ROTATOR CUFF REPAIR AND SUBACROMIAL DECOMPRESSION Right 09/29/2012   Procedure: SHOULDER ARTHROSCOPY WITH ROTATOR CUFF REPAIR AND SUBACROMIAL DECOMPRESSION;  Surgeon: Nita Sells, MD;  Location: [facility];  Service: Orthopedics;  Laterality: Right;  Right shoulder arthroscopy with subacromial decompression and distal clavicle excision & Debridement of Labrial Tear   SHOULDER SURGERY Left    x2   UPPER GASTROINTESTINAL ENDOSCOPY      FAMILY HISTORY Family History  Problem Relation Age of Onset   Diabetes Mother    Colon cancer Father 27   Diabetes Other    Colon polyps Brother    Diabetes Maternal Aunt    Cancer Cousin        breast cancer    SOCIAL HISTORY Social History   Tobacco Use   Smoking status: Never   Smokeless tobacco: Never  Vaping Use   Vaping Use: Never used  Substance Use Topics   Alcohol use: Yes    Alcohol/week: 12.0 standard drinks of  alcohol    Types: 12 Cans of beer per week   Drug use: No         OPHTHALMIC EXAM:  Base Eye Exam     Visual Acuity (ETDRS)       Right Left   Dist cc 20/20 20/20    Correction: Glasses         Tonometry (Tonopen, 10:05 AM)       Right Left   Pressure 17 10         Pupils       Pupils APD   Right PERRL None   Left PERRL None         Visual Fields       Left Right    Full Full  Extraocular Movement       Right Left    Full, Ortho Full, Ortho         Neuro/Psych     Oriented x3: Yes   Mood/Affect: Normal         Dilation     Right eye: 2.5% Phenylephrine, 1.0% Mydriacyl @ 10:05 AM           Slit Lamp and Fundus Exam     External Exam       Right Left   External Normal Normal         Slit Lamp Exam       Right Left   Lids/Lashes Normal Normal   Conjunctiva/Sclera White and quiet White and quiet   Cornea Clear Clear   Anterior Chamber Deep and quiet Deep and quiet   Iris Round and reactive Round and reactive, visible remnants of iris neovascularization, undilated   Lens Posterior chamber intraocular lens,, , 1+ Posterior capsular opacification 2+ Nuclear sclerosis   Anterior Vitreous Normal Normal         Fundus Exam       Right Left   Posterior Vitreous Vitrectomized,    Disc Normal    C/D Ratio 0.15    Macula no exudates, Microaneurysms, Focal laser scars, Epiretinal membrane, Macular thickening, Mild clinically significant macular edema    Vessels Proliferative diabetic retinopathy quiesced since    Periphery Good PRP and attached             IMAGING AND PROCEDURES  Imaging and Procedures for 04/18/22  OCT, Retina - OU - Both Eyes       Right Eye Quality was good. Scan locations included subfoveal. Central Foveal Thickness: 348. Progression has improved. Findings include cystoid macular edema, epiretinal membrane.   Left Eye Quality was good. Scan locations included subfoveal. Central  Foveal Thickness: 298. Progression has been stable. Findings include abnormal foveal contour, cystoid macular edema.   Notes CSME OD, improved CSME OD at 8 weeks post most recent injection.  Repeat injection today   OS, much improved OS today post Eylea at 2 weeks, with low-lying PVD, no longer VMA OS, repeat evaluation OS next as scheduled     Intravitreal Injection, Pharmacologic Agent - OD - Right Eye       Time Out 04/18/2022. 10:42 AM. Confirmed correct patient, procedure, site, and patient consented.   Anesthesia Topical anesthesia was used. Anesthetic medications included Lidocaine 4%.   Procedure Preparation included 5% betadine to ocular surface, 10% betadine to eyelids. A 30 gauge needle was used.   Injection: 2 mg aflibercept 2 MG/0.05ML   Route: Intravitreal, Site: Right Eye   NDC: [id number], Lot: [id number], Expiration date: 11/19/2022, Waste: 0 mL   Post-op Post injection exam found visual acuity of at least counting fingers. The patient tolerated the procedure well. There were no complications. The patient received written and verbal post procedure care education. Post injection medications included ocuflox.              ASSESSMENT/PLAN:  Diabetic macular edema of right eye with proliferative retinopathy associated with type 2 diabetes mellitus ([place])  The nature of diabetic macular edema was discussed with the patient. Treatment options were outlined including medical therapy, laser & vitrectomy. The use of injectable medications reviewed, including Avastin, Lucentis, and Eylea. Periodic injections into the eye are likely to resolve diabetic macular edema (swelling in the center of vision). Initially, injections are delivered are delivered every 4-6 weeks, and the  interval extended as the condition improves. On average, 8-9 injections the first year, and 5 in year 2. Improvement in the condition most often improves on medical therapy. Occasional use of focal  laser is also recommended for residual macular edema (swelling). Excellent control of blood glucose and blood pressure are encouraged under the care of a primary physician or endocrinologist. Similarly, attempts to maintain serum cholesterol, low density lipoproteins, and high-density lipoproteins in a favorable range were recommended.   OD, improved overall but yet still temporal thickening to the fovea.  This is likely to improve as patient is likely to commence therapy for untreated sleep apnea in the near future once machinery is arrived.  Nonetheless at this time at 8-week and we will repeat Eylea today and maintain 8 to 10-week interval right eye  OSA (obstructive sleep apnea) Diagnosis confirmed.  Patient has machinery ordered, for use of CPAP nightly.  And eager to begin use  We will protect macular condition from nightly hypoxic damage  Proliferative diabetic retinopathy of left eye with macular edema associated with type 2 diabetes mellitus ([place]) OS confirmed improvement post injection antivegF of CSME.  Currently 2 weeks post most recent injection follow-up as scheduled within 1 month      ICD-10-CM   1. Diabetic macular edema of right eye with proliferative retinopathy associated with type 2 diabetes mellitus ([facility])  E11.3511 OCT, Retina - OU - Both Eyes    Intravitreal Injection, Pharmacologic Agent - OD - Right Eye    aflibercept (EYLEA) SOLN 2 mg    2. OSA (obstructive sleep apnea)  G47.33     3. Proliferative diabetic retinopathy of left eye with macular edema associated with type 2 diabetes mellitus ([place])  D92.4268       1.  2.  3.  Ophthalmic Meds Ordered this visit:  Meds ordered this encounter  Medications   aflibercept (EYLEA) SOLN 2 mg       Return in about 10 weeks (around 06/27/2022) for dilate, EYLEA OCT, OD.  There are no Patient Instructions on file for this visit.   Explained the diagnoses, plan, and follow up with the patient and they expressed  understanding.  Patient expressed understanding of the importance of proper follow up care.   Clent Demark Ashtin Rosner M.D. Diseases & Surgery of the Retina and Vitreous Retina & Diabetic [place] 04/18/22     Abbreviations: M myopia (nearsighted); A astigmatism; H hyperopia (farsighted); P presbyopia; Mrx spectacle prescription;  CTL contact lenses; OD right eye; OS left eye; OU both eyes  XT exotropia; ET esotropia; PEK punctate epithelial keratitis; PEE punctate epithelial erosions; DES dry eye syndrome; MGD meibomian gland dysfunction; ATs artificial tears; PFAT's preservative free artificial tears; [place] nuclear sclerotic cataract; PSC posterior subcapsular cataract; ERM epi-retinal membrane; PVD posterior vitreous detachment; RD retinal detachment; DM diabetes mellitus; DR diabetic retinopathy; NPDR non-proliferative diabetic retinopathy; PDR proliferative diabetic retinopathy; CSME clinically significant macular edema; DME diabetic macular edema; dbh dot blot hemorrhages; CWS cotton wool spot; POAG primary open angle glaucoma; C/D cup-to-disc ratio; HVF humphrey visual field; GVF goldmann visual field; OCT optical coherence tomography; IOP intraocular pressure; BRVO Branch retinal vein occlusion; CRVO central retinal vein occlusion; CRAO central retinal artery occlusion; BRAO branch retinal artery occlusion; RT retinal tear; SB scleral buckle; PPV pars plana vitrectomy; VH Vitreous hemorrhage; PRP panretinal laser photocoagulation; IVK intravitreal kenalog; VMT vitreomacular traction; MH Macular hole;  NVD neovascularization of the disc; NVE neovascularization elsewhere; AREDS age related eye disease study; ARMD  age related macular degeneration; POAG primary open angle glaucoma; EBMD epithelial/anterior basement membrane dystrophy; ACIOL anterior chamber intraocular lens; IOL intraocular lens; PCIOL posterior chamber intraocular lens; Phaco/IOL phacoemulsification with intraocular lens placement; [place]  photorefractive keratectomy; LASIK laser assisted in situ keratomileusis; HTN hypertension; DM diabetes mellitus; COPD chronic obstructive pulmonary disease

## 2022-04-19 NOTE — Assessment & Plan Note (Addendum)
-   Patient has symptoms of restless sleep, loud snoring and waking up gasping for air. HST 03/15/22 showed moderate obstructive sleep apnea, AHI 16.6/hour. Reviewed treatment options, recommending patient be started on CPAP d.t clinical symptoms and moderate severity of OSA and severe oxygen desaturation. Patient in agreement with plan. Order placed for auto CPAP 5-15cm h20 with mask of choice. Encourage patient aim to wear CPAP every night for 4-6 hours or longer. Continue to work on weight loss effort.Recommend getting ONO once established on therapy to ensure that he does not need oxygen. FU 31-90 days after starting CPAP for compliance check.

## 2022-05-14 ENCOUNTER — Ambulatory Visit (INDEPENDENT_AMBULATORY_CARE_PROVIDER_SITE_OTHER): Payer: 59 | Admitting: Ophthalmology

## 2022-05-14 ENCOUNTER — Encounter (INDEPENDENT_AMBULATORY_CARE_PROVIDER_SITE_OTHER): Payer: Self-pay | Admitting: Ophthalmology

## 2022-05-14 DIAGNOSIS — H4312 Vitreous hemorrhage, left eye: Secondary | ICD-10-CM

## 2022-05-14 DIAGNOSIS — E113511 Type 2 diabetes mellitus with proliferative diabetic retinopathy with macular edema, right eye: Secondary | ICD-10-CM | POA: Diagnosis not present

## 2022-05-14 DIAGNOSIS — G4733 Obstructive sleep apnea (adult) (pediatric): Secondary | ICD-10-CM

## 2022-05-14 DIAGNOSIS — H211X2 Other vascular disorders of iris and ciliary body, left eye: Secondary | ICD-10-CM | POA: Diagnosis not present

## 2022-05-14 DIAGNOSIS — E113512 Type 2 diabetes mellitus with proliferative diabetic retinopathy with macular edema, left eye: Secondary | ICD-10-CM | POA: Diagnosis not present

## 2022-05-14 MED ORDER — AFLIBERCEPT 2MG/0.05ML IZ SOLN FOR KALEIDOSCOPE
2.0000 mg | INTRAVITREAL | Status: AC | PRN
  Administered 2022-05-14: 2 mg via INTRAVITREAL

## 2022-05-14 NOTE — Progress Notes (Signed)
05/14/2022     CHIEF COMPLAINT Patient presents for  Chief Complaint  Patient presents with   Diabetic Retinopathy with Macular Edema      HISTORY OF PRESENT ILLNESS: Mitchell Palmer is a 56 y.o. male who presents to the clinic today for:   HPI   6 weeks for DILATE OS, EYLEA OCT. Pt stated vision has remained stable since last visit. 2 weeks after onset of use nightly of CPAP for sleep apnea  And OD today at 4 weeks postinjection still with persistence of intraretinal fluid and CSME temporally.  Last edited by Hurman Horn, MD on 05/14/2022  9:14 AM.      Referring physician: Tower, Wynelle Fanny, MD [place],  [place] [id number]  HISTORICAL INFORMATION:   Selected notes from the MEDICAL RECORD NUMBER    Lab Results  Component Value Date   HGBA1C 7.3 (A) 03/21/2022     CURRENT MEDICATIONS: No current outpatient medications on file. (Ophthalmic Drugs)   No current facility-administered medications for this visit. (Ophthalmic Drugs)   Current Outpatient Medications (Other)  Medication Sig   B-D UF III MINI PEN NEEDLES 31G X 5 MM MISC USE AS DIRECTED 4 TIMES A DAY   cetirizine (ZYRTEC) 10 MG tablet Take 10 mg by mouth daily.   Cholecalciferol (VITAMIN D) 50 MCG (2000 UT) tablet Take 2,000 Units by mouth daily.   Continuous Blood Gluc Sensor (FREESTYLE LIBRE 3 SENSOR) MISC 1 applicator by Does not apply route every 14 (fourteen) days.   cyclobenzaprine (FLEXERIL) 10 MG tablet Take 1 tablet (10 mg total) by mouth 3 (three) times daily as needed.   fluticasone (FLONASE) 50 MCG/ACT nasal spray Place 2 sprays into both nostrils daily as needed for allergies or rhinitis.   insulin glargine (LANTUS SOLOSTAR) 100 UNIT/ML Solostar Pen INJECT 42 UNITS IN THE MORNING AND 50 UNITS IN THE EVENING   insulin lispro (HUMALOG KWIKPEN) 100 UNIT/ML KwikPen INJECT 15-25 UNITS INTO THE SKIN 3 TIMES DAILY W/ MEALS AND 10-12 UNITS INTO THE SKIN W/ SNACKS   JARDIANCE 25 MG  TABS tablet TAKE 1 TABLET BY MOUTH DAILY BEFORE BREAKFAST.   levothyroxine (SYNTHROID) 75 MCG tablet Take 1 tablet (75 mcg total) by mouth daily before breakfast.   metFORMIN (GLUCOPHAGE) 1000 MG tablet Take 1 tablet (1,000 mg total) by mouth 2 (two) times daily with a meal.   olmesartan (BENICAR) 40 MG tablet TAKE 1 TABLET BY MOUTH EVERY DAY   omeprazole (PRILOSEC) 40 MG capsule TAKE 1 CAPSULE BY MOUTH EVERY DAY   ONETOUCH ULTRA test strip USE TO TEST BLOOD SUGAR 4 TIMES DAILY AS DIRECTED   rosuvastatin (CRESTOR) 10 MG tablet TAKE 1 TABLET BY MOUTH EVERY DAY   tadalafil (CIALIS) 20 MG tablet Take 1 tablet (20 mg total) by mouth daily as needed for erectile dysfunction.   tirzepatide (MOUNJARO) 10 MG/0.5ML Pen INJECT 10 MG UNDER THE SKIN ONCE WEEKLY   vitamin B-12 (CYANOCOBALAMIN) 1000 MCG tablet Take 1,000 mcg by mouth daily.   No current facility-administered medications for this visit. (Other)      REVIEW OF SYSTEMS: ROS   Negative for: Constitutional, Gastrointestinal, Neurological, Skin, Genitourinary, Musculoskeletal, HENT, Endocrine, Cardiovascular, Eyes, Respiratory, Psychiatric, Allergic/Imm, Heme/Lymph Last edited by Silvestre Moment on 05/14/2022  8:27 AM.       ALLERGIES Allergies  Allergen Reactions   Codeine     Anxious   Lipitor [Atorvastatin]     Muscle pain  PAST MEDICAL HISTORY Past Medical History:  Diagnosis Date   Allergy    allegic rhinitis   Diabetes mellitus    type II   Esophageal stricture    GERD (gastroesophageal reflux disease)    History of hernia repair    as a baby   Hyperlipidemia    Hyperplastic polyp of intestine 2010   Hypertension    OSA (obstructive sleep apnea) 04/18/2022   Proliferative diabetic retinopathy of left eye ([place]) 03/31/2020   This is active as a patient still has tufts of the anterior segment neovascularization, rubeosis in the left eye every region of capillary nonperfusion should be treated with PRP.   S/P ear surgery,  follow-up exam    for drainage   Vitreous hemorrhage of right eye ([place]) 12/24/2019   Past Surgical History:  Procedure Laterality Date   BIOPSY  12/07/2021   Procedure: BIOPSY;  Surgeon: Ladene Artist, MD;  Location: [facility];  Service: Gastroenterology;;   CATARACT EXTRACTION     COLONOSCOPY  03/07/2016   COLONOSCOPY WITH PROPOFOL N/A 12/07/2021   Procedure: COLONOSCOPY WITH PROPOFOL;  Surgeon: Ladene Artist, MD;  Location: [facility];  Service: Gastroenterology;  Laterality: N/A;   ESOPHAGOGASTRODUODENOSCOPY  04/20/2009   stricture/GERD   ESOPHAGOGASTRODUODENOSCOPY (EGD) WITH PROPOFOL N/A 12/07/2021   Procedure: ESOPHAGOGASTRODUODENOSCOPY (EGD) WITH PROPOFOL;  Surgeon: Ladene Artist, MD;  Location: [facility];  Service: Gastroenterology;  Laterality: N/A;   EYE SURGERY     HERNIA REPAIR     age 58-midline   INNER EAR SURGERY  08/20/1986   went in behind [facility]   POLYPECTOMY     SAVORY DILATION N/A 12/07/2021   Procedure: SAVORY DILATION;  Surgeon: Ladene Artist, MD;  Location: [facility];  Service: Gastroenterology;  Laterality: N/A;   SHOULDER ARTHROSCOPY WITH ROTATOR CUFF REPAIR AND SUBACROMIAL DECOMPRESSION Right 09/29/2012   Procedure: SHOULDER ARTHROSCOPY WITH ROTATOR CUFF REPAIR AND SUBACROMIAL DECOMPRESSION;  Surgeon: Nita Sells, MD;  Location: [facility];  Service: Orthopedics;  Laterality: Right;  Right shoulder arthroscopy with subacromial decompression and distal clavicle excision & Debridement of Labrial Tear   SHOULDER SURGERY Left    x2   UPPER GASTROINTESTINAL ENDOSCOPY      FAMILY HISTORY Family History  Problem Relation Age of Onset   Diabetes Mother    Colon cancer Father 42   Diabetes Other    Colon polyps Brother    Diabetes Maternal Aunt    Cancer Cousin        breast cancer    SOCIAL HISTORY Social History   Tobacco Use   Smoking status: Never   Smokeless  tobacco: Never  Vaping Use   Vaping Use: Never used  Substance Use Topics   Alcohol use: Yes    Alcohol/week: 12.0 standard drinks of alcohol    Types: 12 Cans of beer per week   Drug use: No         OPHTHALMIC EXAM:  Base Eye Exam     Visual Acuity (ETDRS)       Right Left   Dist cc 20/20 -1 20/20 -2    Correction: Glasses         Tonometry (Tonopen, 8:30 AM)       Right Left   Pressure 15 16         Pupils       Pupils APD   Right PERRL None  Left PERRL None         Visual Fields       Left Right    Full Full         Extraocular Movement       Right Left    Full, Ortho Full, Ortho         Neuro/Psych     Oriented x3: Yes   Mood/Affect: Normal         Dilation     Left eye: 2.5% Phenylephrine, 1.0% Mydriacyl @ 8:30 AM           Slit Lamp and Fundus Exam     External Exam       Right Left   External Normal Normal         Slit Lamp Exam       Right Left   Lids/Lashes Normal Normal   Conjunctiva/Sclera White and quiet White and quiet   Cornea Clear Clear   Anterior Chamber Deep and quiet Deep and quiet   Iris Round and reactive Round and reactive, visible remnants of iris neovascularization, undilated   Lens Posterior chamber intraocular lens,, , 1+ Posterior capsular opacification 2+ Nuclear sclerosis   Anterior Vitreous Normal Normal         Fundus Exam       Right Left   Posterior Vitreous  no posterior vitreous detachment   Disc  Normal   C/D Ratio  0.2   Macula  Microaneurysms, no macular thickening, no clinically significant macular edema   Vessels  Proliferative diabetic retinopathy, with good PRP yet there are a temporal window anterior to the equator in the left eye in addition to the region temporal to the macula in the left eye with large regions of capillary nonperfusion which might be triggering the anterior segment neovascularization   Periphery  Good PRP and attached, some room anterior in the  temporal retina for more PRP if disease reactivates, small NVE superior            IMAGING AND PROCEDURES  Imaging and Procedures for 05/14/22  OCT, Retina - OU - Both Eyes       Right Eye Quality was good. Scan locations included subfoveal. Central Foveal Thickness: 358. Progression has improved. Findings include cystoid macular edema, epiretinal membrane.   Left Eye Quality was good. Scan locations included subfoveal. Central Foveal Thickness: 308. Progression has been stable. Findings include abnormal foveal contour, cystoid macular edema.   Notes CSME OD, improved CSME OD at 4 weeks post most recent injection.  Follow-up OD as scheduled   OS, much improved OS today post Eylea at 6 weeks, with low-lying PVD, no longer VMA OS, repeat junction OS now and reading evaluate next OS in 9 weeks     Intravitreal Injection, Pharmacologic Agent - OS - Left Eye       Time Out 05/14/2022. 9:17 AM. Confirmed correct patient, procedure, site, and patient consented.   Anesthesia Topical anesthesia was used. Anesthetic medications included Lidocaine 4%.   Procedure Preparation included 5% betadine to ocular surface, 10% betadine to eyelids, Tobramycin 0.3%. A 30 gauge needle was used.   Injection: 2 mg aflibercept 2 MG/0.05ML   Route: Intravitreal, Site: Left Eye   NDC: [id number], Lot: [id number], Expiration date: 04/22/2023, Waste: 0 mL   Post-op Post injection exam found visual acuity of at least counting fingers. The patient tolerated the procedure well. There were no complications. The patient received written and verbal post procedure care education.  Post injection medications were not given.              ASSESSMENT/PLAN:  Vitreous hemorrhage, left eye (HCC) Condition improving  Rubeosis iridis of left eye Resolved  Proliferative diabetic retinopathy of left eye with macular edema associated with type 2 diabetes mellitus (HCC) Improving OS.  Currently at 6-week  interval post Eylea OS.  Repeat Eylea today and reevaluate  8 to 9 weeks  Diabetic macular edema of right eye with proliferative retinopathy associated with type 2 diabetes mellitus ([place]) 4 weeks postinjection, follow-up as scheduled next     ICD-10-CM   1. Proliferative diabetic retinopathy of left eye with macular edema associated with type 2 diabetes mellitus (HCC)  S01.0932 OCT, Retina - OU - Both Eyes    Intravitreal Injection, Pharmacologic Agent - OS - Left Eye    aflibercept (EYLEA) SOLN 2 mg    2. Vitreous hemorrhage, left eye (HCC)  H43.12     3. Rubeosis iridis of left eye  H21.1X2     4. Diabetic macular edema of right eye with proliferative retinopathy associated with type 2 diabetes mellitus ([place])  E11.3511     5. OSA (obstructive sleep apnea)  G47.33       1.  OD with persistence of CSME, follow-up as scheduled OD  2.  3.  Ophthalmic Meds Ordered this visit:  Meds ordered this encounter  Medications   aflibercept (EYLEA) SOLN 2 mg       Return in about 9 weeks (around 07/16/2022) for dilate, OS, EYLEA OCT,,and OD as scheduled.  Patient Instructions  Patient to call the office 7 to 10 days prior to next visit to confirm credentialing process for night health care and contracting is complete  Alternative is to refer patient out to another provider upon that phone call   Explained the diagnoses, plan, and follow up with the patient and they expressed understanding.  Patient expressed understanding of the importance of proper follow up care.   Clent Demark Emersyn Kotarski M.D. Diseases & Surgery of the Retina and Vitreous Retina & Diabetic [place] 05/14/22     Abbreviations: M myopia (nearsighted); A astigmatism; H hyperopia (farsighted); P presbyopia; Mrx spectacle prescription;  CTL contact lenses; OD right eye; OS left eye; OU both eyes  XT exotropia; ET esotropia; PEK punctate epithelial keratitis; PEE punctate epithelial erosions; DES dry eye syndrome; MGD  meibomian gland dysfunction; ATs artificial tears; PFAT's preservative free artificial tears; Ricketts nuclear sclerotic cataract; PSC posterior subcapsular cataract; ERM epi-retinal membrane; PVD posterior vitreous detachment; RD retinal detachment; DM diabetes mellitus; DR diabetic retinopathy; NPDR non-proliferative diabetic retinopathy; PDR proliferative diabetic retinopathy; CSME clinically significant macular edema; DME diabetic macular edema; dbh dot blot hemorrhages; CWS cotton wool spot; POAG primary open angle glaucoma; C/D cup-to-disc ratio; HVF humphrey visual field; GVF goldmann visual field; OCT optical coherence tomography; IOP intraocular pressure; BRVO Branch retinal vein occlusion; CRVO central retinal vein occlusion; CRAO central retinal artery occlusion; BRAO branch retinal artery occlusion; RT retinal tear; SB scleral buckle; PPV pars plana vitrectomy; VH Vitreous hemorrhage; PRP panretinal laser photocoagulation; IVK intravitreal kenalog; VMT vitreomacular traction; MH Macular hole;  NVD neovascularization of the disc; NVE neovascularization elsewhere; AREDS age related eye disease study; ARMD age related macular degeneration; POAG primary open angle glaucoma; EBMD epithelial/anterior basement membrane dystrophy; ACIOL anterior chamber intraocular lens; IOL intraocular lens; PCIOL posterior chamber intraocular lens; Phaco/IOL phacoemulsification with intraocular lens placement; PRK photorefractive keratectomy; LASIK laser assisted in situ keratomileusis; HTN hypertension;  DM diabetes mellitus; COPD chronic obstructive pulmonary disease

## 2022-05-14 NOTE — Patient Instructions (Signed)
Patient to call the office 7 to 10 days prior to next visit to confirm credentialing process for night health care and contracting is complete  Alternative is to refer patient out to another provider upon that phone call

## 2022-05-14 NOTE — Assessment & Plan Note (Signed)
Condition improving

## 2022-05-14 NOTE — Assessment & Plan Note (Signed)
4 weeks postinjection, follow-up as scheduled next

## 2022-05-14 NOTE — Assessment & Plan Note (Signed)
Improving OS.  Currently at 6-week interval post Eylea OS.  Repeat Eylea today and reevaluate  8 to 9 weeks

## 2022-05-14 NOTE — Assessment & Plan Note (Signed)
Resolved

## 2022-05-15 ENCOUNTER — Ambulatory Visit (INDEPENDENT_AMBULATORY_CARE_PROVIDER_SITE_OTHER): Payer: 59

## 2022-05-15 ENCOUNTER — Telehealth: Payer: Self-pay

## 2022-05-15 DIAGNOSIS — Z23 Encounter for immunization: Secondary | ICD-10-CM | POA: Diagnosis not present

## 2022-05-15 NOTE — Telephone Encounter (Signed)
Per appt notes pt is already scheduled.  [place] Night - Client Nonclinical Telephone Record  [facility] Client [facility] Night - Client Client Site [place] Provider Loura Pardon - MD Contact Type Call Who Is Calling Patient / Member / Family / Caregiver Caller Name Mitchell Palmer Caller Phone Number [phone] Patient Name Mitchell Palmer Patient DOB 25-Apr-1966 Call Type Message Only Information Provided Reason for Call Request to Schedule Office Appointment Initial Comment Caller states he wants to schedule an appt to get flu shot and shingles shot. Patient request to speak to RN No Additional Comment Office hours provided Disp. Time Disposition Final User 05/15/2022 8:10:29 AM General Information Provided Yes Cathlean Sauer Call Closed By: Cathlean Sauer Transaction Date/Time: 05/15/2022 8:05:03 AM (ET

## 2022-05-23 ENCOUNTER — Ambulatory Visit: Payer: 59

## 2022-05-23 ENCOUNTER — Ambulatory Visit (INDEPENDENT_AMBULATORY_CARE_PROVIDER_SITE_OTHER): Payer: 59

## 2022-05-23 DIAGNOSIS — Z23 Encounter for immunization: Secondary | ICD-10-CM | POA: Diagnosis not present

## 2022-05-23 NOTE — Progress Notes (Signed)
Per orders of Dr. Glori Bickers, 1st injection of shingrix given by Loreen Freud. Patient tolerated injection well.

## 2022-05-26 ENCOUNTER — Other Ambulatory Visit: Payer: Self-pay | Admitting: Internal Medicine

## 2022-05-26 DIAGNOSIS — E1122 Type 2 diabetes mellitus with diabetic chronic kidney disease: Secondary | ICD-10-CM

## 2022-06-12 ENCOUNTER — Encounter: Payer: Self-pay | Admitting: Primary Care

## 2022-06-12 ENCOUNTER — Ambulatory Visit (INDEPENDENT_AMBULATORY_CARE_PROVIDER_SITE_OTHER): Payer: 59 | Admitting: Primary Care

## 2022-06-12 VITALS — BP 116/68 | HR 90 | Temp 98.1°F | Ht 69.0 in | Wt 217.4 lb

## 2022-06-12 DIAGNOSIS — G4733 Obstructive sleep apnea (adult) (pediatric): Secondary | ICD-10-CM | POA: Diagnosis not present

## 2022-06-12 DIAGNOSIS — J301 Allergic rhinitis due to pollen: Secondary | ICD-10-CM

## 2022-06-12 NOTE — Progress Notes (Signed)
$'@Patient'p$  ID: Mitchell Palmer, male    DOB: 10-04-1965, 56 y.o.   MRN: [id number]  Chief Complaint  Patient presents with   Follow-up    Referring provider: Abner Greenspan, MD  HPI: 56 year-old male, never smoked.  Past medical history significant for hypertension, allergic rhinitis, GERD, type 2 diabetes, obesity.  Previous [facility] pulmonary encounter:  12/29/2021 Patient presents today for sleep consult. He was referred by Dr. Deloria Lair for possible sleep apnea d/t diabetic retinopathy He has symptoms of restless sleep, loud snoring, wakes up gasping for air and daytime fatigue. 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 Typical bedtime is either 7:30am or 9pm. It can take him 30-60 mins to fall asleep. He wakes up 3-4 times a night. He starts his day at either 4:30pm and 9am. No previous sleep study. He is not on CPAP or oxygen. Epworh 2. Denies narcolepsy, cataplexy or sleep walking.   Sleep questionnaire Symptoms-   Restless sleep, loud snoring, wakes up gasping for air Prior sleep study- None  Bedtime- 7:20am or 9pm  Time to fall asleep- 30-60 mins  Nocturnal awakenings- 3-4 times  Out of bed/start of day- 4:30pm or 9am Weight changes- fluctuates 5-10lbs  Do you operate heavy machinery- Yes  Do you currently wear CPAP- No Do you current wear oxygen- No Epworth- 2  04/12/2022  Patient is here today to review sleep study results.  Patient has symptoms of restless sleep, loud snoring and waking up gasping for air.  HST on 03/15/22 showed moderate OSA, AHI 16.6/hr with SpO2 low 48% (baseline 89%).  Due to severity of sleep apnea and oxygen desaturation recommending patient be started on CPAP.  Patient is in agreement with plan.  06/12/2022 - Interim hx  Patient presents today for 2 to 17-monthfollow-up OSA.  Patient originally presented back in May for sleep consult due to symptoms of restless sleep, loud snoring and waking up gasping for air.  Home sleep  study on 03/15/2022 showed moderate obstructive sleep apnea, AHI 16.6 an hour.  Due to moderate severity of his sleep apnea and clinical symptoms along with severe oxygen desaturation patient was started on auto CPAP 5 to 15 cm H2O with mask of choice.  He originally had some issues with mask. He has nasal congestion at night, uses flonase nasal spray. More than half the time he feels that he is sleeping better. Snoring has improved. Needs ONO on CPAP.   ICodeConnect 05/01/22-05/22/22 21/22 days used (95%) Average usage 7 hours 28 mins Pressure 5-15cm h20 (9.8cm h20-95%) Airleaks 4.4L/min AHI 4.9    Allergies  Allergen Reactions   Codeine     Anxious   Lipitor [Atorvastatin]     Muscle pain     Immunization History  Administered Date(s) Administered   Hep A / Hep B 03/26/2018, 04/28/2018, 09/30/2018   Influenza Split 06/17/2012   Influenza Whole 06/20/2010   Influenza,inj,Quad PF,6+ Mos 06/03/2013, 06/30/2014, 07/22/2015, 05/15/2016, 05/10/2017, 04/28/2018, 05/28/2019, 05/31/2020, 05/24/2021, 05/15/2022   PFIZER(Purple Top)SARS-COV-2 Vaccination 03/16/2020, 04/08/2020   Pneumococcal Polysaccharide-23 10/12/2011, 02/27/2017   Td 08/21/1999, 09/14/2020   Tdap 01/12/2011   Zoster Recombinat (Shingrix) 05/23/2022    Past Medical History:  Diagnosis Date   Allergy    allegic rhinitis   Diabetes mellitus    type II   Esophageal stricture    GERD (gastroesophageal reflux disease)    History of hernia repair    as a baby   Hyperlipidemia  Hyperplastic polyp of intestine 2010   Hypertension    OSA (obstructive sleep apnea) 04/18/2022   Proliferative diabetic retinopathy of left eye ([place]) 03/31/2020   This is active as a patient still has tufts of the anterior segment neovascularization, rubeosis in the left eye every region of capillary nonperfusion should be treated with PRP.   S/P ear surgery, follow-up exam    for drainage   Vitreous hemorrhage of right eye ([place]) 12/24/2019     Tobacco History: Social History   Tobacco Use  Smoking Status Never  Smokeless Tobacco Never   Counseling given: Not Answered   Outpatient Medications Prior to Visit  Medication Sig Dispense Refill   B-D UF III MINI PEN NEEDLES 31G X 5 MM MISC USE AS DIRECTED 4 TIMES A DAY 400 each 3   cetirizine (ZYRTEC) 10 MG tablet Take 10 mg by mouth daily.     Cholecalciferol (VITAMIN D) 50 MCG (2000 UT) tablet Take 2,000 Units by mouth daily.     Continuous Blood Gluc Sensor (FREESTYLE LIBRE 3 SENSOR) MISC 1 applicator by Does not apply route every 14 (fourteen) days. 6 each 3   cyclobenzaprine (FLEXERIL) 10 MG tablet Take 1 tablet (10 mg total) by mouth 3 (three) times daily as needed. 30 tablet 3   fluticasone (FLONASE) 50 MCG/ACT nasal spray Place 2 sprays into both nostrils daily as needed for allergies or rhinitis. 16 mL 3   insulin glargine (LANTUS SOLOSTAR) 100 UNIT/ML Solostar Pen INJECT 42 UNITS IN THE MORNING AND 50 UNITS IN THE EVENING 60 mL 3   insulin lispro (HUMALOG KWIKPEN) 100 UNIT/ML KwikPen INJECT 15-25 UNITS INTO THE SKIN 3 TIMES DAILY W/ MEALS AND 10-12 UNITS INTO THE SKIN W/ SNACKS 60 mL 3   JARDIANCE 25 MG TABS tablet TAKE 1 TABLET BY MOUTH EVERY DAY BEFORE BREAKFAST 90 tablet 0   levothyroxine (SYNTHROID) 75 MCG tablet Take 1 tablet (75 mcg total) by mouth daily before breakfast. 90 tablet 3   metFORMIN (GLUCOPHAGE) 1000 MG tablet Take 1 tablet (1,000 mg total) by mouth 2 (two) times daily with a meal. 60 tablet 11   olmesartan (BENICAR) 40 MG tablet TAKE 1 TABLET BY MOUTH EVERY DAY 90 tablet 2   omeprazole (PRILOSEC) 40 MG capsule TAKE 1 CAPSULE BY MOUTH EVERY DAY 30 capsule 3   ONETOUCH ULTRA test strip USE TO TEST BLOOD SUGAR 4 TIMES DAILY AS DIRECTED 400 strip 11   rosuvastatin (CRESTOR) 10 MG tablet TAKE 1 TABLET BY MOUTH EVERY DAY 30 tablet 11   tadalafil (CIALIS) 20 MG tablet Take 1 tablet (20 mg total) by mouth daily as needed for erectile dysfunction. 4 tablet 5    tirzepatide (MOUNJARO) 10 MG/0.5ML Pen INJECT 10 MG UNDER THE SKIN ONCE WEEKLY 6 mL 3   vitamin B-12 (CYANOCOBALAMIN) 1000 MCG tablet Take 1,000 mcg by mouth daily.     No facility-administered medications prior to visit.    Review of Systems  Review of Systems  Constitutional: Negative.   HENT:  Positive for congestion and postnasal drip.   Respiratory: Negative.  Negative for apnea.   Cardiovascular: Negative.   Psychiatric/Behavioral:  Negative for sleep disturbance.      Physical Exam  BP 116/68 (BP Location: Left Arm, Patient Position: Sitting, Cuff Size: Normal)   Pulse 90   Temp 98.1 F (36.7 C) (Oral)   Ht '5\' 9"'$  (1.753 m)   Wt 217 lb 6.4 oz (98.6 kg)   SpO2 98%  BMI 32.10 kg/m  Physical Exam Constitutional:      Appearance: Normal appearance.  HENT:     Head: Normocephalic and atraumatic.  Cardiovascular:     Rate and Rhythm: Normal rate and regular rhythm.  Pulmonary:     Effort: Pulmonary effort is normal.     Breath sounds: Normal breath sounds.  Skin:    General: Skin is warm and dry.  Neurological:     General: No focal deficit present.     Mental Status: He is alert and oriented to person, place, and time. Mental status is at baseline.  Psychiatric:        Mood and Affect: Mood normal.        Behavior: Behavior normal.        Thought Content: Thought content normal.        Judgment: Judgment normal.      Lab Results:  CBC    Component Value Date/Time   WBC 7.4 10/27/2021 0745   RBC 4.63 10/27/2021 0745   HGB 16.2 10/27/2021 0745   HCT 45.7 10/27/2021 0745   PLT 244.0 10/27/2021 0745   MCV 98.9 10/27/2021 0745   MCHC 35.4 10/27/2021 0745   RDW 12.7 10/27/2021 0745   LYMPHSABS 1.3 10/27/2021 0745   MONOABS 0.6 10/27/2021 0745   EOSABS 0.3 10/27/2021 0745   BASOSABS 0.0 10/27/2021 0745    BMET    Component Value Date/Time   NA 140 10/27/2021 0745   K 5.0 10/27/2021 0745   CL 101 10/27/2021 0745   CO2 29 10/27/2021 0745    GLUCOSE 178 (H) 10/27/2021 0745   BUN 14 10/27/2021 0745   CREATININE 1.12 10/27/2021 0745   CALCIUM 9.3 10/27/2021 0745   GFRNONAA >90 09/23/2012 1200   GFRAA >90 09/23/2012 1200    BNP No results found for: "BNP"  ProBNP No results found for: "PROBNP"  Imaging: Intravitreal Injection, Pharmacologic Agent - OS - Left Eye  Result Date: 05/14/2022 Time Out 05/14/2022. 9:17 AM. Confirmed correct patient, procedure, site, and patient consented. Anesthesia Topical anesthesia was used. Anesthetic medications included Lidocaine 4%. Procedure Preparation included 5% betadine to ocular surface, 10% betadine to eyelids, Tobramycin 0.3%. A 30 gauge needle was used. Injection: 2 mg aflibercept 2 MG/0.05ML   Route: Intravitreal, Site: Left Eye   NDC: [id number], Lot: [id number], Expiration date: 04/22/2023, Waste: 0 mL Post-op Post injection exam found visual acuity of at least counting fingers. The patient tolerated the procedure well. There were no complications. The patient received written and verbal post procedure care education. Post injection medications were not given.   OCT, Retina - OU - Both Eyes  Result Date: 05/14/2022 Right Eye Quality was good. Scan locations included subfoveal. Central Foveal Thickness: 358. Progression has improved. Findings include cystoid macular edema, epiretinal membrane. Left Eye Quality was good. Scan locations included subfoveal. Central Foveal Thickness: 308. Progression has been stable. Findings include abnormal foveal contour, cystoid macular edema. Notes CSME OD, improved CSME OD at 4 weeks post most recent injection.  Follow-up OD as scheduled OS, much improved OS today post Eylea at 6 weeks, with low-lying PVD, no longer VMA OS, repeat junction OS now and reading evaluate next OS in 9 weeks    Assessment & Plan:   OSA (obstructive sleep apnea) - Home sleep study 03/15/2022 showed moderate obstructive sleep apnea, AHI 16.6 an hour with severe oxygen  desaturations.  Patient was started on CPAP back in August/September.  DME company is [employer].  He uses  a [employer] is available via [employer].  He is compliant with use.  Pressure settings auto 5 to 15 cm H2O (9.8cm h20-95); Residual AHI 4.9 an hour.  He is having no significant air leaks.  Patient needs overnight oximetry test on CPAP to determine oxygen need.  Continue to encourage patient work on weight loss efforts.  Follow-up in 6 months or sooner if needed.  Allergic rhinitis - Continue Flonase and nasal saline rinses   Martyn Ehrich, NP 06/12/2022

## 2022-06-12 NOTE — Assessment & Plan Note (Signed)
-   Home sleep study 03/15/2022 showed moderate obstructive sleep apnea, AHI 16.6 an hour with severe oxygen desaturations.  Patient was started on CPAP back in August/September.  DME company is [employer].  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  He is compliant with use.  Pressure settings auto 5 to 15 cm H2O (9.8cm h20-95); Residual AHI 4.9 an hour.  He is having no significant air leaks.  Patient needs overnight oximetry test on CPAP to determine oxygen need.  Continue to encourage patient work on weight loss efforts.  Follow-up in 6 months or sooner if needed.

## 2022-06-12 NOTE — Progress Notes (Signed)
Reviewed and agree with assessment/plan.   Chesley Mires, MD [facility] Pulmonary/Critical Care 06/12/2022, 12:16 PM Pager:  [phone]

## 2022-06-12 NOTE — Patient Instructions (Addendum)
Recommendations: - Continue CPAP every night for minimum 4 to 6 hours or longer - Continue weight loss effort as you have been  - Do not drive if experiencing daytime sleepiness - Use flonase nasal spray and ocean saline rinses at bedtime for nasal congestion   Orders: - ONO on CPAP   Follow-up: - 6 months with Beth NP   CPAP and BIPAP Information CPAP and BIPAP are methods that use air pressure to keep your airways open and to help you breathe well. CPAP and BIPAP use different amounts of pressure. Your health care provider will tell you whether CPAP or BIPAP would be more helpful for you. CPAP stands for "continuous positive airway pressure." With CPAP, the amount of pressure stays the same while you breathe in (inhale) and out (exhale). BIPAP stands for "bi-level positive airway pressure." With BIPAP, the amount of pressure will be higher when you inhale and lower when you exhale. This allows you to take larger breaths. CPAP or BIPAP may be used in the hospital, or your health care provider may want you to use it at home. You may need to have a sleep study before your health care provider can order a machine for you to use at home. What are the advantages? CPAP or BIPAP can be helpful if you have: Sleep apnea. Chronic obstructive pulmonary disease (COPD). Heart failure. Medical conditions that cause muscle weakness, including muscular dystrophy or amyotrophic lateral sclerosis (ALS). Other problems that cause breathing to be shallow, weak, abnormal, or difficult. CPAP and BIPAP are most commonly used for obstructive sleep apnea (OSA) to keep the airways from collapsing when the muscles relax during sleep. What are the risks? Generally, this is a safe treatment. However, problems may occur, including: Irritated skin or skin sores if the mask does not fit properly. Dry or stuffy nose or nosebleeds. Dry mouth. Feeling gassy or bloated. Sinus or lung infection if the equipment is not  cleaned properly. When should CPAP or BIPAP be used? In most cases, the mask only needs to be worn during sleep. Generally, the mask needs to be worn throughout the night and during any daytime naps. People with certain medical conditions may also need to wear the mask at other times, such as when they are awake. Follow instructions from your health care provider about when to use the machine. What happens during CPAP or BIPAP?  Both CPAP and BIPAP are provided by a small machine with a flexible plastic tube that attaches to a plastic mask that you wear. Air is blown through the mask into your nose or mouth. The amount of pressure that is used to blow the air can be adjusted on the machine. Your health care provider will set the pressure setting and help you find the best mask for you. Tips for using the mask Because the mask needs to be snug, some people feel trapped or closed-in (claustrophobic) when first using the mask. If you feel this way, you may need to get used to the mask. One way to do this is to hold the mask loosely over your nose or mouth and then gradually apply the mask more snugly. You can also gradually increase the amount of time that you use the mask. Masks are available in various types and sizes. If your mask does not fit well, talk with your health care provider about getting a different one. Some common types of masks include: Full face masks, which fit over the mouth and nose. Nasal  masks, which fit over the nose. Nasal pillow or prong masks, which fit into the nostrils. If you are using a mask that fits over your nose and you tend to breathe through your mouth, a chin strap may be applied to help keep your mouth closed. Use a skin barrier to protect your skin as told by your health care provider. Some CPAP and BIPAP machines have alarms that may sound if the mask comes off or develops a leak. If you have trouble with the mask, it is very important that you talk with your  health care provider about finding a way to make the mask easier to tolerate. Do not stop using the mask. There could be a negative impact on your health if you stop using the mask. Tips for using the machine Place your CPAP or BIPAP machine on a secure table or stand near an electrical outlet. Know where the on/off switch is on the machine. Follow instructions from your health care provider about how to set the pressure on your machine and when you should use it. Do not eat or drink while the CPAP or BIPAP machine is on. Food or fluids could get pushed into your lungs by the pressure of the CPAP or BIPAP. For home use, CPAP and BIPAP machines can be rented or purchased through home health care companies. Many different brands of machines are available. Renting a machine before purchasing may help you find out which particular machine works well for you. Your health insurance company may also decide which machine you may get. Keep the CPAP or BIPAP machine and attachments clean. Ask your health care provider for specific instructions. Check the humidifier if you have a dry stuffy nose or nosebleeds. Make sure it is working correctly. Follow these instructions at home: Take over-the-counter and prescription medicines only as told by your health care provider. Ask if you can take sinus medicine if your sinuses are blocked. Do not use any products that contain nicotine or tobacco. These products include cigarettes, chewing tobacco, and vaping devices, such as e-cigarettes. If you need help quitting, ask your health care provider. Keep all follow-up visits. This is important. Contact a health care provider if: You have redness or pressure sores on your head, face, mouth, or nose from the mask or head gear. You have trouble using the CPAP or BIPAP machine. You cannot tolerate wearing the CPAP or BIPAP mask. Someone tells you that you snore even when wearing your CPAP or BIPAP. Get help right away  if: You have trouble breathing. You feel confused. Summary CPAP and BIPAP are methods that use air pressure to keep your airways open and to help you breathe well. If you have trouble with the mask, it is very important that you talk with your health care provider about finding a way to make the mask easier to tolerate. Do not stop using the mask. There could be a negative impact to your health if you stop using the mask. Follow instructions from your health care provider about when to use the machine. This information is not intended to replace advice given to you by your health care provider. Make sure you discuss any questions you have with your health care provider. Document Revised: 03/15/2021 Document Reviewed: 07/15/2020 Elsevier Patient Education  [place].

## 2022-06-12 NOTE — Assessment & Plan Note (Addendum)
-   Continue Flonase and nasal saline rinses

## 2022-06-26 ENCOUNTER — Telehealth: Payer: Self-pay

## 2022-06-26 ENCOUNTER — Other Ambulatory Visit: Payer: Self-pay | Admitting: Orthopedic Surgery

## 2022-06-26 DIAGNOSIS — M25561 Pain in right knee: Secondary | ICD-10-CM

## 2022-06-26 NOTE — Telephone Encounter (Signed)
Received ONO from [facility] medical. ONO faxed to [facility] at [facility] office for review.  Routing to [employer] as an Pharmacist, hospital.

## 2022-06-27 ENCOUNTER — Encounter (INDEPENDENT_AMBULATORY_CARE_PROVIDER_SITE_OTHER): Payer: 59 | Admitting: Ophthalmology

## 2022-07-02 ENCOUNTER — Encounter: Payer: Self-pay | Admitting: Internal Medicine

## 2022-07-02 ENCOUNTER — Other Ambulatory Visit: Payer: Self-pay | Admitting: Internal Medicine

## 2022-07-02 MED ORDER — TIRZEPATIDE 12.5 MG/0.5ML ~~LOC~~ SOAJ: 12.5000 mg | SUBCUTANEOUS | 5 refills | Status: DC

## 2022-07-03 ENCOUNTER — Other Ambulatory Visit: Payer: Self-pay | Admitting: Internal Medicine

## 2022-07-03 MED ORDER — TIRZEPATIDE 12.5 MG/0.5ML ~~LOC~~ SOAJ: 12.5000 mg | SUBCUTANEOUS | 5 refills | Status: DC

## 2022-07-04 LAB — HM DIABETES EYE EXAM

## 2022-07-06 ENCOUNTER — Telehealth: Payer: Self-pay | Admitting: Primary Care

## 2022-07-06 NOTE — Telephone Encounter (Signed)
Patient is returning phone call. Patient phone number is [phone].

## 2022-07-06 NOTE — Telephone Encounter (Signed)
Attempted to call pt but unable to reach. Left message for him to return call. °

## 2022-07-07 ENCOUNTER — Ambulatory Visit
Admission: RE | Admit: 2022-07-07 | Discharge: 2022-07-07 | Disposition: A | Discharge status: Home / Self Care | Payer: 59 | Source: Ambulatory Visit | Attending: Orthopedic Surgery | Admitting: Orthopedic Surgery

## 2022-07-07 DIAGNOSIS — M25561 Pain in right knee: Secondary | ICD-10-CM

## 2022-07-09 ENCOUNTER — Encounter: Payer: Self-pay | Admitting: Internal Medicine

## 2022-07-09 NOTE — Telephone Encounter (Addendum)
Patient is aware of results and voiced his understanding. Nothing further needed.

## 2022-07-09 NOTE — Telephone Encounter (Signed)
06/22/22  ONO on CPAP  showed patient spent 2 min with SpO2 <88%. SpO2 low 83% and basal 95%.  He does not need supplemental oxygen

## 2022-07-09 NOTE — Telephone Encounter (Signed)
Beth have you received the ONO results?

## 2022-07-09 NOTE — Telephone Encounter (Signed)
Lm x1 for patient.  

## 2022-07-09 NOTE — Telephone Encounter (Signed)
Yes, ill send to [facility]

## 2022-07-13 ENCOUNTER — Other Ambulatory Visit: Payer: Self-pay

## 2022-07-13 ENCOUNTER — Other Ambulatory Visit: Payer: Self-pay | Admitting: Family Medicine

## 2022-07-13 NOTE — Telephone Encounter (Signed)
ONO results were provided in 10/06 encounter. Patient was made aware. Will close this encounter.

## 2022-07-16 ENCOUNTER — Encounter (INDEPENDENT_AMBULATORY_CARE_PROVIDER_SITE_OTHER): Payer: 59 | Admitting: Ophthalmology

## 2022-07-23 ENCOUNTER — Encounter (HOSPITAL_BASED_OUTPATIENT_CLINIC_OR_DEPARTMENT_OTHER): Payer: Self-pay

## 2022-07-23 ENCOUNTER — Emergency Department (HOSPITAL_BASED_OUTPATIENT_CLINIC_OR_DEPARTMENT_OTHER)
Admission: EM | Admit: 2022-07-23 | Discharge: 2022-07-23 | Disposition: A | Discharge status: Home / Self Care | Payer: 59 | Attending: Emergency Medicine | Admitting: Emergency Medicine

## 2022-07-23 ENCOUNTER — Emergency Department (HOSPITAL_BASED_OUTPATIENT_CLINIC_OR_DEPARTMENT_OTHER): Payer: 59

## 2022-07-23 ENCOUNTER — Other Ambulatory Visit: Payer: Self-pay

## 2022-07-23 DIAGNOSIS — R519 Headache, unspecified: Secondary | ICD-10-CM | POA: Diagnosis not present

## 2022-07-23 DIAGNOSIS — Z794 Long term (current) use of insulin: Secondary | ICD-10-CM | POA: Insufficient documentation

## 2022-07-23 DIAGNOSIS — H5789 Other specified disorders of eye and adnexa: Secondary | ICD-10-CM | POA: Diagnosis present

## 2022-07-23 DIAGNOSIS — E119 Type 2 diabetes mellitus without complications: Secondary | ICD-10-CM

## 2022-07-23 DIAGNOSIS — H579 Unspecified disorder of eye and adnexa: Secondary | ICD-10-CM

## 2022-07-23 LAB — CBC WITH DIFFERENTIAL/PLATELET
Abs Immature Granulocytes: 0.02 10*3/uL (ref 0.00–0.07)
Basophils Absolute: 0.1 10*3/uL (ref 0.0–0.1)
Basophils Relative: 1 %
Eosinophils Absolute: 0.2 10*3/uL (ref 0.0–0.5)
Eosinophils Relative: 3 %
HCT: 47.5 % (ref 39.0–52.0)
Hemoglobin: 17.2 g/dL — ABNORMAL HIGH (ref 13.0–17.0)
Immature Granulocytes: 0 %
Lymphocytes Relative: 21 %
Lymphs Abs: 1.5 10*3/uL (ref 0.7–4.0)
MCH: 34.5 pg — ABNORMAL HIGH (ref 26.0–34.0)
MCHC: 36.2 g/dL — ABNORMAL HIGH (ref 30.0–36.0)
MCV: 95.2 fL (ref 80.0–100.0)
Monocytes Absolute: 0.7 10*3/uL (ref 0.1–1.0)
Monocytes Relative: 10 %
Neutro Abs: 4.8 10*3/uL (ref 1.7–7.7)
Neutrophils Relative %: 65 %
Platelets: 215 10*3/uL (ref 150–400)
RBC: 4.99 MIL/uL (ref 4.22–5.81)
RDW: 12.1 % (ref 11.5–15.5)
WBC: 7.4 10*3/uL (ref 4.0–10.5)
nRBC: 0 % (ref 0.0–0.2)

## 2022-07-23 LAB — COMPREHENSIVE METABOLIC PANEL
ALT: 24 U/L (ref 0–44)
AST: 22 U/L (ref 15–41)
Albumin: 4.6 g/dL (ref 3.5–5.0)
Alkaline Phosphatase: 66 U/L (ref 38–126)
Anion gap: 9 (ref 5–15)
BUN: 13 mg/dL (ref 6–20)
CO2: 27 mmol/L (ref 22–32)
Calcium: 9.3 mg/dL (ref 8.9–10.3)
Chloride: 103 mmol/L (ref 98–111)
Creatinine, Ser: 1.05 mg/dL (ref 0.61–1.24)
GFR, Estimated: >60 mL/min (ref >60)
Glucose, Bld: 158 mg/dL — ABNORMAL HIGH (ref 70–99)
Potassium: 4.3 mmol/L (ref 3.5–5.1)
Sodium: 139 mmol/L (ref 135–145)
Total Bilirubin: 0.7 mg/dL (ref 0.3–1.2)
Total Protein: 7.2 g/dL (ref 6.5–8.1)

## 2022-07-23 LAB — TROPONIN I (HIGH SENSITIVITY)
Troponin I (High Sensitivity): 4 ng/L (ref <18)
Troponin I (High Sensitivity): 5 ng/L (ref <18)

## 2022-07-23 LAB — HM DIABETES EYE EXAM

## 2022-07-23 NOTE — ED Notes (Signed)
Pt ambulated to [facility]

## 2022-07-23 NOTE — Discharge Instructions (Signed)
It was our pleasure to provide your ER care today - we hope that you feel better. Drink plenty of fluids/stay well hydrated.   Follow up closely with your eye doctor tomorrow - call him/office in AM - make sure to discuss/clarify what additional testing that they are recommending that you have done.   Also follow up closely with your primary care doctor in the next 1-2 days.  Return to ER if worse, new symptoms, fevers, new/severe pain, sudden change in vision, new numbness/weakness, change in speech, severe headaches, chest pain, or other emergency concern.

## 2022-07-23 NOTE — ED Triage Notes (Addendum)
Pt just came from Dr. Dahlia Bailiff office (eye doctor) Requested pt come to ED to be evaluated for ? Heart issue Rt. Eye is dilated (drops) Pt went to see him today for his bilateral eye swelling. Complains of pressure behind eyes rt > left  Denies any CP or SHOB

## 2022-07-23 NOTE — ED Provider Notes (Incomplete)
[facility] DE[facility] Provider Note   CSN: [id number] Arrival date & time: 07/23/22  1648     History {Add pertinent medical, surgical, social history, OB history to HPI:1} Chief Complaint  Patient presents with   needs and ekg   Pressure Behind the Eyes    Mitchell Palmer is a 56 y.o. male.  Pt indicates he saw his eye doctor today b/c yesterday he had a pressure feeling in right eye - states had dilated eye exam today and that was told pressure in eyes normal, but that he needed to go to ED and 'get ecg'.  Pt denies any chest pain or discomfort. No palpitations. No sob. No current acute change in vision, or acute eye pain.   The history is provided by the patient and medical records.       Home Medications Prior to Admission medications   Medication Sig Start Date End Date Taking? Authorizing Provider  cetirizine (ZYRTEC) 10 MG tablet Take 10 mg by mouth daily.    [provider]  Cholecalciferol (VITAMIN D) 50 MCG (2000 UT) tablet Take 2,000 Units by mouth daily.    [provider]  Continuous Blood Gluc Sensor (FREESTYLE LIBRE 3 SENSOR) MISC 1 applicator by Does not apply route every 14 (fourteen) days. 12/06/21   Philemon Kingdom, MD  cyclobenzaprine (FLEXERIL) 10 MG tablet Take 1 tablet (10 mg total) by mouth 3 (three) times daily as needed. 11/06/21   Tower, Wynelle Fanny, MD  fluticasone (FLONASE) 50 MCG/ACT nasal spray Place 2 sprays into both nostrils daily as needed for allergies or rhinitis. 11/22/21   Tower, Wynelle Fanny, MD  insulin glargine (LANTUS SOLOSTAR) 100 UNIT/ML Solostar Pen INJECT 42 UNITS IN THE MORNING AND 50 UNITS IN THE EVENING 03/21/22   Philemon Kingdom, MD  insulin lispro (HUMALOG KWIKPEN) 100 UNIT/ML KwikPen INJECT 15-25 UNITS INTO THE SKIN 3 TIMES DAILY W/ MEALS AND 10-12 UNITS INTO THE SKIN W/ SNACKS 03/21/22   Philemon Kingdom, MD  Insulin Pen Needle (B-D UF III MINI PEN NEEDLES) 31G X 5 MM MISC USE AS DIRECTED 4 TIMES A DAY  07/16/22   Philemon Kingdom, MD  JARDIANCE 25 MG TABS tablet TAKE 1 TABLET BY MOUTH EVERY DAY BEFORE BREAKFAST 05/28/22   Philemon Kingdom, MD  levothyroxine (SYNTHROID) 75 MCG tablet Take 1 tablet (75 mcg total) by mouth daily before breakfast. 03/21/22   Philemon Kingdom, MD  metFORMIN (GLUCOPHAGE) 1000 MG tablet Take 1 tablet (1,000 mg total) by mouth 2 (two) times daily with a meal. 11/14/21   Philemon Kingdom, MD  olmesartan (BENICAR) 40 MG tablet TAKE 1 TABLET BY MOUTH EVERY DAY 11/22/21   Tower, Wynelle Fanny, MD  omeprazole (PRILOSEC) 40 MG capsule TAKE 1 CAPSULE BY MOUTH EVERY DAY 07/16/22   Tower, Wynelle Fanny, MD  ONETOUCH ULTRA test strip USE TO TEST BLOOD SUGAR 4 TIMES DAILY AS DIRECTED 10/05/21   Philemon Kingdom, MD  rosuvastatin (CRESTOR) 10 MG tablet TAKE 1 TABLET BY MOUTH EVERY DAY 03/23/22   Tower, Wynelle Fanny, MD  tadalafil (CIALIS) 20 MG tablet Take 1 tablet (20 mg total) by mouth daily as needed for erectile dysfunction. 02/23/22   Tower, Wynelle Fanny, MD  tirzepatide [facility]) 12.5 MG/0.5ML Pen Inject 12.5 mg into the skin once a week. 07/03/22   Philemon Kingdom, MD  vitamin B-12 (CYANOCOBALAMIN) 1000 MCG tablet Take 1,000 mcg by mouth daily.    [provider]      Allergies    Codeine and  Lipitor [atorvastatin]    Review of Systems   Review of Systems  Constitutional:  Negative for chills and fever.  HENT:  Negative for sore throat.   Eyes:  Negative for redness and visual disturbance.  Respiratory:  Negative for shortness of breath.   Cardiovascular:  Negative for chest pain and palpitations.  Gastrointestinal:  Negative for nausea and vomiting.  Musculoskeletal:  Negative for neck pain.  Neurological:  Negative for speech difficulty, weakness, numbness and headaches.  Hematological:  Does not bruise/bleed easily.    Physical Exam Updated Vital Signs BP (!) 150/83 (BP Location: Left Arm)   Pulse 78   Temp 98.1 F (36.7 C)   Resp 18   Ht 1.753 m ('5\' 9"'$ )   Wt 99.8 kg    SpO2 100%   BMI 32.49 kg/m  Physical Exam Vitals and nursing note reviewed.  Constitutional:      Appearance: Normal appearance. He is well-developed.  HENT:     Head: Atraumatic.     Nose: Nose normal.     Mouth/Throat:     Mouth: Mucous membranes are moist.     Pharynx: Oropharynx is clear.  Eyes:     General: No scleral icterus.    Conjunctiva/sclera: Conjunctivae normal.     Comments: Right eye dilated (?recent meds). Eomi.  Neck:     Trachea: No tracheal deviation.  Cardiovascular:     Rate and Rhythm: Normal rate and regular rhythm.     Pulses: Normal pulses.     Heart sounds: Normal heart sounds. No murmur heard.    No friction rub. No gallop.  Pulmonary:     Effort: Pulmonary effort is normal. No accessory muscle usage or respiratory distress.     Breath sounds: Normal breath sounds.  Abdominal:     General: There is no distension.  Musculoskeletal:        General: No swelling or tenderness.     Cervical back: Normal range of motion and neck supple. No rigidity.     Right lower leg: No edema.     Left lower leg: No edema.  Skin:    General: Skin is warm and dry.     Findings: No rash.  Neurological:     Mental Status: He is alert.     Comments: Alert, speech clear, no dysarthria or aphasia. Motor/sens grossly intact bil. No pronator drift, stre 5/5. Steady gait.   Psychiatric:        Mood and Affect: Mood normal.     ED Results / Procedures / Treatments   Labs (all labs ordered are listed, but only abnormal results are displayed) Results for orders placed or performed during the hospital encounter of 07/23/22  CBC with Differential  Result Value Ref Range   WBC 7.4 4.0 - 10.5 K/uL   RBC 4.99 4.22 - 5.81 MIL/uL   Hemoglobin 17.2 (H) 13.0 - 17.0 g/dL   HCT 47.5 39.0 - 52.0 %   MCV 95.2 80.0 - 100.0 fL   MCH 34.5 (H) 26.0 - 34.0 pg   MCHC 36.2 (H) 30.0 - 36.0 g/dL   RDW 12.1 11.5 - 15.5 %   Platelets 215 150 - 400 K/uL   nRBC 0.0 0.0 - 0.2 %    Neutrophils Relative % 65 %   Neutro Abs 4.8 1.7 - 7.7 K/uL   Lymphocytes Relative 21 %   Lymphs Abs 1.5 0.7 - 4.0 K/uL   Monocytes Relative 10 %   Monocytes Absolute 0.7  0.1 - 1.0 K/uL   Eosinophils Relative 3 %   Eosinophils Absolute 0.2 0.0 - 0.5 K/uL   Basophils Relative 1 %   Basophils Absolute 0.1 0.0 - 0.1 K/uL   Immature Granulocytes 0 %   Abs Immature Granulocytes 0.02 0.00 - 0.07 K/uL  Comprehensive metabolic panel  Result Value Ref Range   Sodium 139 135 - 145 mmol/L   Potassium 4.3 3.5 - 5.1 mmol/L   Chloride 103 98 - 111 mmol/L   CO2 27 22 - 32 mmol/L   Glucose, Bld 158 (H) 70 - 99 mg/dL   BUN 13 6 - 20 mg/dL   Creatinine, Ser 1.05 0.61 - 1.24 mg/dL   Calcium 9.3 8.9 - 10.3 mg/dL   Total Protein 7.2 6.5 - 8.1 g/dL   Albumin 4.6 3.5 - 5.0 g/dL   AST 22 15 - 41 U/L   ALT 24 0 - 44 U/L   Alkaline Phosphatase 66 38 - 126 U/L   Total Bilirubin 0.7 0.3 - 1.2 mg/dL   GFR, Estimated >60 >60 mL/min   Anion gap 9 5 - 15  Troponin I (High Sensitivity)  Result Value Ref Range   Troponin I (High Sensitivity) 4 <18 ng/L  Troponin I (High Sensitivity)  Result Value Ref Range   Troponin I (High Sensitivity) 5 <18 ng/L   MR KNEE RIGHT WO CONTRAST  Result Date: 07/09/2022 CLINICAL DATA:  Chronic right knee pain.  No prior surgery. EXAM: MRI OF THE RIGHT KNEE WITHOUT CONTRAST TECHNIQUE: Multiplanar, multisequence MR imaging of the knee was performed. No intravenous contrast was administered. COMPARISON:  None Available. FINDINGS: MENISCI Medial meniscus:  Intact. Lateral meniscus: Free edge fraying of the midbody. Otherwise intact. LIGAMENTS Cruciates:  Intact ACL and PCL. Collaterals: Medial collateral ligament is intact. Lateral collateral ligament complex is intact. CARTILAGE Patellofemoral: Large areas of full-thickness cartilage loss over the lateral patellar facet with trace subchondral marrow edema. Full-thickness fissuring over the patellar apex. Diffuse cartilage thinning  over the lateral trochlea with small focal full-thickness cartilage defect superiorly. Medial:  No chondral defect. Lateral:  Mild diffuse cartilage thinning without focal defect. Joint: Small joint effusion. Prominent edema in the superolateral aspect of Hoffa's fat. Popliteal Fossa:  Tiny Baker cyst.  Intact popliteus tendon. Extensor Mechanism: Intact quadriceps tendon and patellar tendon. Intact medial and lateral patellar retinaculum. Intact MPFL. Bones: No acute fracture or dislocation. Mild lateral patellar subluxation. No suspicious bone lesion. Other: None. IMPRESSION: 1. Free edge fraying of the lateral meniscus midbody. 2. 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 3. Prominent edema in the superolateral aspect of Hoffa's fat pad, which can be seen in the setting of patellar tendon-lateral femoral condyle friction syndrome. Electronically Signed   By: Titus Dubin M.D.   On: 07/09/2022 08:02    EKG EKG Interpretation  Date/Time:  Monday July 23 2022 16:56:23 EST Ventricular Rate:  81 PR Interval:  226 QRS Duration: 86 QT Interval:  352 QTC Calculation: 408 R Axis:   45 Text Interpretation: Sinus rhythm with sinus arrhythmia with 1st degree A-V block Non-specific ST-t changes Confirmed by Lajean Saver [id number]) on 07/23/2022 5:11:59 PM  Radiology CT Head Wo Contrast  Result Date: 07/23/2022 CLINICAL DATA:  Headache, new onset.  Pressure behind eyes. EXAM: CT HEAD WITHOUT CONTRAST TECHNIQUE: Contiguous axial images were obtained from the base of the skull through the vertex without intravenous contrast. RADIATION DOSE REDUCTION: This exam was performed according to the departmental dose-optimization program which includes  automated exposure control, adjustment of the mA and/or kV according to patient size and/or use of iterative reconstruction technique. COMPARISON:  None Available. FINDINGS: Brain: No acute intracranial hemorrhage, midline shift or  mass effect. No extra-axial fluid collection. Gray-white matter differentiation is within normal limits. No hydrocephalus. Vascular: No hyperdense vessel or unexpected calcification. Skull: No acute fracture. Surgical changes are noted at the mastoid on the right. Sinuses/Orbits: No acute finding. Other: None. IMPRESSION: No acute intracranial process. Electronically Signed   By: Brett Fairy M.D.   On: 07/23/2022 20:46    Procedures Procedures  {Document cardiac monitor, telemetry assessment procedure when appropriate:1}  Medications Ordered in ED Medications - No data to display  ED Course/ Medical Decision Making/ A&P                           Medical Decision Making Amount and/or Complexity of Data Reviewed Labs: ordered. Radiology: ordered.   Iv ns. Continuous pulse ox and cardiac monitoring. Labs ordered/sent.   Reviewed nursing notes and prior charts for additional history. External reports reviewed.   Cardiac monitor: sinus rhythm, rate 78.  Labs reviewed/interpreted by me - chem normal. Trop normal.   CT reviewed/interpreted by me  - no hem.   Attempted to contact Dr Zadie Rhine. Initially staff paged Dr Ellie Lunch, ophthy on call - she spoke with pt, and indicates not clear what tests pts eye specialist looking for, rec calling on call for that practice.  She tried to page/reach him, but no response.   On call for Dr Zadie Rhine was paged, discussed w Dr Herbert Deaner, she indicates not clear what ED tests he would have been looking for, gave his number - called number, no answer.   Pt continues to deny any acute deterioration in his vision today. No chest pain or discomfort. No severe headache or neck pain. No acute neuro symptoms - no change in speech, no numbness/weakness, no problems w balance or coordination.    Pt currently appears stable for d/c. Rec close f/u with Dr Zadie Rhine tomorrow to clarify concerns/plan, and facilitate appropriate follow up.   Return precautions provided.       {Document critical care time when appropriate:1} {Document review of labs and clinical decision tools ie heart score, Chads2Vasc2 etc:1}  {Document your independent review of radiology images, and any outside records:1} {Document your discussion with family members, caretakers, and with consultants:1} {Document social determinants of health affecting pt's care:1} {Document your decision making why or why not admission, treatments were needed:1} Final Clinical Impression(s) / ED Diagnoses Final diagnoses:  None    Rx / DC Orders ED Discharge Orders     None

## 2022-07-24 ENCOUNTER — Encounter: Payer: Self-pay | Admitting: Internal Medicine

## 2022-07-24 ENCOUNTER — Telehealth: Payer: Self-pay

## 2022-07-24 NOTE — Telephone Encounter (Signed)
Transition Care Management Follow-up Telephone Call Date of discharge and from where:[facility] 07-23-22 Dx: right eye pain   How have you been since you were released from the hospital? Doing ok  Any questions or concerns? No  Items Reviewed: Did the pt receive and understand the discharge instructions provided? Yes  Medications obtained and verified? Yes  Other? No  Any new allergies since your discharge? No  Dietary orders reviewed? Yes Do you have support at home? Yes   Home Care and Equipment/Supplies: Were home health services ordered? not applicable If so, what is the name of the agency? na  Has the agency set up a time to come to the patient's home? not applicable Were any new equipment or medical supplies ordered?  No What is the name of the medical supply agency? na Were you able to get the supplies/equipment? not applicable Do you have any questions related to the use of the equipment or supplies? No  Functional Questionnaire: (I = Independent and D = Dependent) ADLs: I  Bathing/Dressing- I  Meal Prep- I  Eating- I  Maintaining continence- I  Transferring/Ambulation- I  Managing Meds- I  Follow up appointments reviewed:  PCP Hospital f/u appt confirmed? no Specialist Hospital f/u appt confirmed? Yes  Scheduled to see Dr Zadie Rhine  on 08-02-22- unsure of time . Are transportation arrangements needed? No  If their condition worsens, is the pt aware to call PCP or go to the Emergency Dept.? Yes Was the patient provided with contact information for the PCP's office or ED? Yes Was to pt encouraged to call back with questions or concerns? Yes   Juanda Crumble LPN [place] Direct Dial [phone]

## 2022-07-25 ENCOUNTER — Encounter: Payer: Self-pay | Admitting: Internal Medicine

## 2022-07-25 ENCOUNTER — Ambulatory Visit (INDEPENDENT_AMBULATORY_CARE_PROVIDER_SITE_OTHER): Payer: 59 | Admitting: Internal Medicine

## 2022-07-25 VITALS — BP 138/80 | HR 60 | Ht 69.0 in | Wt 221.6 lb

## 2022-07-25 DIAGNOSIS — Z794 Long term (current) use of insulin: Secondary | ICD-10-CM | POA: Diagnosis not present

## 2022-07-25 DIAGNOSIS — E039 Hypothyroidism, unspecified: Secondary | ICD-10-CM

## 2022-07-25 DIAGNOSIS — E11319 Type 2 diabetes mellitus with unspecified diabetic retinopathy without macular edema: Secondary | ICD-10-CM

## 2022-07-25 DIAGNOSIS — E7800 Pure hypercholesterolemia, unspecified: Secondary | ICD-10-CM | POA: Diagnosis not present

## 2022-07-25 DIAGNOSIS — E1122 Type 2 diabetes mellitus with diabetic chronic kidney disease: Secondary | ICD-10-CM | POA: Diagnosis not present

## 2022-07-25 LAB — POCT GLYCOSYLATED HEMOGLOBIN (HGB A1C): Hemoglobin A1C: 6.8 % — AB (ref 4.0–5.6)

## 2022-07-25 MED ORDER — METFORMIN HCL 1000 MG PO TABS: 1000.0000 mg | ORAL_TABLET | Freq: Two times a day (BID) | ORAL | 3 refills | Status: DC

## 2022-07-25 MED ORDER — FREESTYLE LIBRE 3 SENSOR MISC: 1.0000 | 3 refills | Status: DC

## 2022-07-25 MED ORDER — EMPAGLIFLOZIN 25 MG PO TABS: ORAL_TABLET | ORAL | 3 refills | Status: DC

## 2022-07-25 NOTE — Progress Notes (Signed)
Subjective:     Patient ID: Mitchell Palmer, male   DOB: 10-Dec-1965, 56 y.o.   MRN: [id number]   HPI Mitchell Palmer is a 56 y.o. man, returning for followup for DM2, dx in 2000, uncontrolled, insulin-dependent, with complications (CKD stage 3, erectile dysfunction, mild DR). Last visit 4 months ago.  Interim history: He was prev. shoulder and knee steroid injections >> had shoulder sx 07/25/2021.  He is occasionally getting steroid injections. No increased urination, blurry vision, nausea, chest pain. He was in the emergency room yesterday: blurriness, pressure and pain in his right eye. Investigation was negative so far.  He has an appointment with Dr. Zadie Rhine on 08/02/2022.  Reviewed HbA1c levels: Lab Results  Component Value Date   HGBA1C 7.3 (A) 03/21/2022   HGBA1C 8.3 (A) 11/06/2021   HGBA1C 8.1 (A) 02/22/2021   HGBA1C 8.0 (A) 10/17/2020   HGBA1C 7.8 (A) 05/11/2020   HGBA1C 7.5 (A) 01/07/2020   HGBA1C 7.5 (A) 09/08/2019   HGBA1C 7.1 (A) 04/16/2019   HGBA1C 7.6 (A) 09/03/2018   HGBA1C 8.2 (A) 05/21/2018   HGBA1C 7.4 (A) 01/27/2018   HGBA1C 7.6 10/21/2017   HGBA1C 8.0 05/10/2017   HGBA1C 7.4 11/15/2016   HGBA1C 7.6 08/15/2016   HGBA1C 7.7 05/15/2016   HGBA1C 7.8 02/15/2016   HGBA1C 7.7 11/08/2015   HGBA1C 8.0 (H) 07/22/2015   HGBA1C 8.9 (H) 12/06/2014   He is on: - Metformin 1000 mg 2x a day with meals >> 1000 mg x1 a day  - Jardiance 10 mg before breakfast - added 08/2018 >> 25 mg daily -  Mounjaro 10 >> 12.5 units weekly - Lantus 35 >> 42 units 2x a day >> 42 units at waking up and 50 units at bedtime >> 40 units 2x a day - Humalog 20-25 units before each meal, but 25 units with b'fast (40 when on steroids) >> 30-35 units for breakfast but 20-25 units before the rest of the meals Uses 10-15 units for correction.  He was on: - he stopped  Invokana 08/2016 as he had leg tingling - Cycloset - started 04/22/2013 >> at 1.6 mg daily (tried 3 tabs >> dizziness >> backed off to  2) - Victoza 1.8 mg daily - Actos 45 mg daily - Januvia - glyburide/metformin - Avandia. - Bydureon 2 mg weekly - he hated the thick needle   He is checking his CBG more than 4 times a day with his newly acquired freestyle libre 3 CGM:  Previously:  Lowest: 80s >> 77 >> 71 >> 70 >> 52 >> 59 (took too much Humalog for pizza);  He has hypoglycemia awareness in the 80s. Highest 340 (sweets), 400 (steroids) >> 300 >> 331 >> 200s.  He works nights: 6 PM to 7:30 AM 3-4 days a week.  Meals: - 5:30-6 pm (at work): subway sandwich >> 2 hot dogs + doritos >> chicken biscuit - snack at 9-10 pm: pistachios or 1/2 sandwich; pizza  >> 12 am: nabs +peanuts, apples - 11 am-12 pm: salad, meat + veggies + rice (leftovers from take out), pizza rarely  + HL. Last lipid panel: Lab Results  Component Value Date   CHOL 133 10/27/2021   HDL 45.50 10/27/2021   LDLCALC 37 10/20/2019   LDLDIRECT 63.0 10/27/2021   TRIG 210.0 (H) 10/27/2021   CHOLHDL 3 10/27/2021  On Lipitor 40.  -+ Stage II CKD, last BUN/Cr:  Lab Results  Component Value Date   BUN 13 07/23/2022   CREATININE 1.05 07/23/2022  Lab Results  Component Value Date   MICRALBCREAT 0.9 02/15/2016   MICRALBCREAT 1.0 12/07/2014   MICRALBCREAT 0.5 11/04/2012  On olmesartan.  - last eye exam was in 10/2021: + NPDR OS, without macular edema.  Dr. Katy Fitch >> now Dr. Zadie Rhine.  He had laser surgery [facility].  Also, In 07/2018 >> had EMG surgery in his R eye for bleeding. He had cataract sx (Dr. Katy Fitch), another surgery (Dr. Zadie Rhine) on R eye in 2021.  - no numbness or tingling in his legs.  Foot exam was done in 10/2021.  He also has a history of HTN, GERD, esophageal stricture, plantar fasciitis, eustachian tube dysfunction - status post ear surgery.  Hypothyroidism: -Started levothyroxine 01/2016 -He was skipping doses in the past and TFTs were fluctuating.  Pt is on levothyroxine 75 mcg daily, taken: - in am - fasting - at least 30 min from  b'fast - no calcium - no iron - no multivitamins - no PPIs - not on Biotin  Reviewed his TFTs: Lab Results  Component Value Date   TSH 4.19 10/27/2021   TSH 2.47 09/02/2020   TSH 1.85 10/20/2019   TSH 2.95 10/21/2018   TSH 5.18 (H) 09/18/2018   TSH 4.25 10/31/2017   TSH 1.17 09/21/2016   TSH 3.05 04/24/2016   TSH 4.97 (H) 03/06/2016   TSH 8.97 (H) 01/23/2016   In 07/2018, he had C spine surgery.  Review of Systems + see HPI  I reviewed pt's medications, allergies, PMH, social hx, family hx, and changes were documented in the history of present illness. Otherwise, unchanged from my initial visit note.  Past Medical History:  Diagnosis Date   Allergy    allegic rhinitis   Diabetes mellitus    type II   Esophageal stricture    GERD (gastroesophageal reflux disease)    History of hernia repair    as a baby   Hyperlipidemia    Hyperplastic polyp of intestine 2010   Hypertension    OSA (obstructive sleep apnea) 04/18/2022   Proliferative diabetic retinopathy of left eye ([place]) 03/31/2020   This is active as a patient still has tufts of the anterior segment neovascularization, rubeosis in the left eye every region of capillary nonperfusion should be treated with PRP.   S/P ear surgery, follow-up exam    for drainage   Vitreous hemorrhage of right eye ([place]) 12/24/2019   Past Surgical History:  Procedure Laterality Date   BIOPSY  12/07/2021   Procedure: BIOPSY;  Surgeon: Ladene Artist, MD;  Location: [facility];  Service: Gastroenterology;;   CATARACT EXTRACTION     COLONOSCOPY  03/07/2016   COLONOSCOPY WITH PROPOFOL N/A 12/07/2021   Procedure: COLONOSCOPY WITH PROPOFOL;  Surgeon: Ladene Artist, MD;  Location: [facility];  Service: Gastroenterology;  Laterality: N/A;   ESOPHAGOGASTRODUODENOSCOPY  04/20/2009   stricture/GERD   ESOPHAGOGASTRODUODENOSCOPY (EGD) WITH PROPOFOL N/A 12/07/2021   Procedure: ESOPHAGOGASTRODUODENOSCOPY (EGD) WITH PROPOFOL;  Surgeon: Ladene Artist, MD;  Location: [facility];  Service: Gastroenterology;  Laterality: N/A;   EYE SURGERY     HERNIA REPAIR     age 89-midline   INNER EAR SURGERY  08/20/1986   went in behind rt [facility]   POLYPECTOMY     SAVORY DILATION N/A 12/07/2021   Procedure: SAVORY DILATION;  Surgeon: Ladene Artist, MD;  Location: [facility];  Service: Gastroenterology;  Laterality: N/A;   SHOULDER ARTHROSCOPY WITH ROTATOR CUFF  REPAIR AND SUBACROMIAL DECOMPRESSION Right 09/29/2012   Procedure: SHOULDER ARTHROSCOPY WITH ROTATOR CUFF REPAIR AND SUBACROMIAL DECOMPRESSION;  Surgeon: Nita Sells, MD;  Location: [facility];  Service: Orthopedics;  Laterality: Right;  Right shoulder arthroscopy with subacromial decompression and distal clavicle excision & Debridement of Labrial Tear   SHOULDER SURGERY Left    x2   UPPER GASTROINTESTINAL ENDOSCOPY     Social History   Socioeconomic History   Marital status: Single    Spouse name: Not on file   Number of children: Not on file   Years of education: Not on file   Highest education level: Not on file  Occupational History   Occupation: Mining engineer  Tobacco Use   Smoking status: Never   Smokeless tobacco: Never  Vaping Use   Vaping Use: Never used  Substance and Sexual Activity   Alcohol use: Yes    Alcohol/week: 12.0 standard drinks of alcohol    Types: 12 Cans of beer per week   Drug use: No   Sexual activity: Not on file  Other Topics Concern   Not on file  Social History Narrative   Not on file   Social Determinants of Health   Financial Resource Strain: Not on file  Food Insecurity: Not on file  Transportation Needs: Not on file  Physical Activity: Not on file  Stress: Not on file  Social Connections: Not on file  Intimate Partner Violence: Not on file   Current Outpatient Medications on File Prior to Visit  Medication Sig Dispense Refill   cetirizine (ZYRTEC) 10 MG  tablet Take 10 mg by mouth daily.     Cholecalciferol (VITAMIN D) 50 MCG (2000 UT) tablet Take 2,000 Units by mouth daily.     Continuous Blood Gluc Sensor (FREESTYLE LIBRE 3 SENSOR) MISC 1 applicator by Does not apply route every 14 (fourteen) days. 6 each 3   cyclobenzaprine (FLEXERIL) 10 MG tablet Take 1 tablet (10 mg total) by mouth 3 (three) times daily as needed. 30 tablet 3   fluticasone (FLONASE) 50 MCG/ACT nasal spray Place 2 sprays into both nostrils daily as needed for allergies or rhinitis. 16 mL 3   insulin glargine (LANTUS SOLOSTAR) 100 UNIT/ML Solostar Pen INJECT 42 UNITS IN THE MORNING AND 50 UNITS IN THE EVENING 60 mL 3   insulin lispro (HUMALOG KWIKPEN) 100 UNIT/ML KwikPen INJECT 15-25 UNITS INTO THE SKIN 3 TIMES DAILY W/ MEALS AND 10-12 UNITS INTO THE SKIN W/ SNACKS 60 mL 3   Insulin Pen Needle (B-D UF III MINI PEN NEEDLES) 31G X 5 MM MISC USE AS DIRECTED 4 TIMES A DAY 100 each 1   JARDIANCE 25 MG TABS tablet TAKE 1 TABLET BY MOUTH EVERY DAY BEFORE BREAKFAST 90 tablet 0   levothyroxine (SYNTHROID) 75 MCG tablet Take 1 tablet (75 mcg total) by mouth daily before breakfast. 90 tablet 3   metFORMIN (GLUCOPHAGE) 1000 MG tablet Take 1 tablet (1,000 mg total) by mouth 2 (two) times daily with a meal. 60 tablet 11   olmesartan (BENICAR) 40 MG tablet TAKE 1 TABLET BY MOUTH EVERY DAY 90 tablet 2   omeprazole (PRILOSEC) 40 MG capsule TAKE 1 CAPSULE BY MOUTH EVERY DAY 90 capsule 0   ONETOUCH ULTRA test strip USE TO TEST BLOOD SUGAR 4 TIMES DAILY AS DIRECTED 400 strip 11   rosuvastatin (CRESTOR) 10 MG tablet TAKE 1 TABLET BY MOUTH EVERY DAY 30 tablet 11   tadalafil (CIALIS) 20 MG tablet Take 1 tablet (  20 mg total) by mouth daily as needed for erectile dysfunction. 4 tablet 5   tirzepatide (MOUNJARO) 12.5 MG/0.5ML Pen Inject 12.5 mg into the skin once a week. 2 mL 5   vitamin B-12 (CYANOCOBALAMIN) 1000 MCG tablet Take 1,000 mcg by mouth daily.     No current facility-administered medications  on file prior to visit.   Allergies  Allergen Reactions   Codeine     Anxious   Lipitor [Atorvastatin]     Muscle pain    Family History  Problem Relation Age of Onset   Diabetes Mother    Colon cancer Father 55   Diabetes Other    Colon polyps Brother    Diabetes Maternal Aunt    Cancer Cousin        breast cancer     Objective:   Physical Exam BP 138/80 (BP Location: Right Arm, Patient Position: Sitting, Cuff Size: Normal)   Pulse 60   Ht '5\' 9"'$  (1.753 m)   Wt 221 lb 9.6 oz (100.5 kg)   SpO2 97%   BMI 32.72 kg/m     Wt Readings from Last 3 Encounters:  07/25/22 221 lb 9.6 oz (100.5 kg)  07/23/22 220 lb (99.8 kg)  06/12/22 217 lb 6.4 oz (98.6 kg)   Constitutional: overweight, in NAD Eyes: no exophthalmos ENT: no masses palpated in neck, no cervical lymphadenopathy Cardiovascular: RRR, No MRG Respiratory: CTA B Musculoskeletal: no deformities Skin: no rashes Neurological: no tremor with outstretched hands  Assessment:     1. 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 HL  3. Hypothyroidism    Plan:     1.  Patient with longstanding, uncontrolled, type 2 diabetes, on a complex medication regimen including oral medications (metformin, SGLT2 inhibitor), basal-bolus insulin regimen and GLP-1/GIP receptor agonist, with improving control.  His sugars started to improve after starting a freestyle libre 3 CGM and especially after switching from Trulicity to [place].  At last visit, there was a significant improvement in his HbA1c (7.3%) so we did not change his regimen but I did advise him to try to make sure that he was taking Humalog 15 minutes before each meal. CGM interpretation: -At today's visit, we reviewed his CGM downloads: It appears that 69% of values are in target range (goal >70%), while 31% are higher than 180 (goal  <25%), and 0% are lower than 70 (goal <4%).  The calculated average blood sugar is 160.  The projected HbA1c for the next 3 months ([facility]MI) is 7.1%. -Reviewing the CGM trends, sugars appear to be improved from before, fluctuating in the upper half of the target range, and only occasionally above 180. -Upon questioning, he decrease the dose of metformin due to diarrhea and he only takes it once a day now, at the beginning of his day.  Since sugars are slightly high throughout the day especially in the last 2 weeks, I did advise him to move metformin with the last meal of his day.  If the sugars remain elevated, he may increase the dose of Lantus preceding the increase in blood sugars.  Otherwise, we can continue the same regimen. - I advised him to: Patient Instructions  Please change: - Metformin 1000 mg to the last meal of your day  Continue: - Jardiance 25 mg before breakfast - Mounjaro 12.5 mg weekly - Humalog (15 min before  each meal) 30-35 units for breakfast but 20-25 units before the rest of the meals  May need to increase: - Lantus 40-46 units in am and 40-46 units at bedtime  Please return in 4 months.  - we checked his HbA1c: 6.8% (lowest in many years!) - advised to check sugars at different times of the day - 4x a day, rotating check times - advised for yearly eye exams >> he is UTD - return to clinic in 4 months  2. HL  -Reviewed his lipid panel from 10/2021: LDL and HDL at goal, triglycerides high: Lab Results  Component Value Date   CHOL 133 10/27/2021   HDL 45.50 10/27/2021   LDLCALC 37 10/20/2019   LDLDIRECT 63.0 10/27/2021   TRIG 210.0 (H) 10/27/2021   CHOLHDL 3 10/27/2021  -He continues on Lipitor 40 mg daily without side effects  3. Hypothyroidism - latest thyroid labs reviewed with pt. >> normal: Lab Results  Component Value Date   TSH 4.19 10/27/2021  - he continues on LT4 75 mcg daily - pt feels good on this dose. - we discussed about taking the thyroid  hormone every day, with water, >30 minutes before breakfast, separated by >4 hours from acid reflux medications, calcium, iron, multivitamins. Pt. is taking it correctly. - will check thyroid tests at next OV  Philemon Kingdom, MD PhD [facility] Endocrinology

## 2022-07-25 NOTE — Patient Instructions (Addendum)
Please change: - Metformin 1000 mg to the last meal of your day  Continue: - Jardiance 25 mg before breakfast - Mounjaro 12.5 mg weekly - Humalog (15 min before each meal) 30-35 units for breakfast but 20-25 units before the rest of the meals  May need to increase: - Lantus 40-46 units in am and 40-46 units at bedtime  Please return in 4 months.

## 2022-07-30 ENCOUNTER — Encounter: Payer: Self-pay | Admitting: Family Medicine

## 2022-07-30 ENCOUNTER — Ambulatory Visit (INDEPENDENT_AMBULATORY_CARE_PROVIDER_SITE_OTHER): Payer: 59 | Admitting: Family Medicine

## 2022-07-30 VITALS — BP 136/78 | HR 65 | Temp 97.7°F | Ht 69.0 in | Wt 223.0 lb

## 2022-07-30 DIAGNOSIS — E785 Hyperlipidemia, unspecified: Secondary | ICD-10-CM

## 2022-07-30 DIAGNOSIS — E11319 Type 2 diabetes mellitus with unspecified diabetic retinopathy without macular edema: Secondary | ICD-10-CM

## 2022-07-30 DIAGNOSIS — I10 Essential (primary) hypertension: Secondary | ICD-10-CM | POA: Diagnosis not present

## 2022-07-30 DIAGNOSIS — E1169 Type 2 diabetes mellitus with other specified complication: Secondary | ICD-10-CM

## 2022-07-30 DIAGNOSIS — R0609 Other forms of dyspnea: Secondary | ICD-10-CM

## 2022-07-30 DIAGNOSIS — Z794 Long term (current) use of insulin: Secondary | ICD-10-CM

## 2022-07-30 NOTE — Assessment & Plan Note (Signed)
This is new  Also some L arm tingling  Had ER visit 12/4 -reassuring  Reviewed hospital records, lab results and studies in detail   Has risk factors for vasc dz incl DM, HTN and lipidemia  Ref made to cardiology for eval/tx  Also has knee surg planned 12/22   ER precautions reviewed today  Will watch for cp or pressure or new symptoms

## 2022-07-30 NOTE — Progress Notes (Signed)
Subjective:    Patient ID: Mitchell Palmer, male    DOB: [birth year], 56 y.o.   MRN: [id number]  HPI Pt presents for f/u of recent ER visit for eye symptoms   Wt Readings from Last 3 Encounters:  07/30/22 223 lb (101.2 kg)  07/25/22 221 lb 9.6 oz (100.5 kg)  07/23/22 220 lb (99.8 kg)   32.93 kg/m  Was seen on 12/4 Noted saw eye doctor that day because he had pressure feeling in R eye and was told that (after dilated exam) eye pressure was ok but needed urgent EKG  From ER note:  Medical Decision Making Amount and/or Complexity of Data Reviewed Labs: ordered. Radiology: ordered.     Iv ns. Continuous pulse ox and cardiac monitoring. Labs ordered/sent.    Reviewed nursing notes and prior charts for additional history. External reports reviewed.    Cardiac monitor: sinus arrhythmia , first deg AV block    Labs reviewed/interpreted by me - chem normal. Trop normal.    CT reviewed/interpreted by me  - no hem.    Attempted to contact Dr Zadie Rhine. Initially staff paged Dr Ellie Lunch, ophthy on call - she spoke with pt, and indicates not clear what tests pts eye specialist looking for, rec calling on call for that practice.  She tried to page/reach him, but no response.    On call for Dr Zadie Rhine was paged, discussed w Dr Herbert Deaner, she indicates not clear what ED tests he would have been looking for, gave his number - called number, no answer.    Pt continues to deny any acute deterioration in his vision today. No chest pain or discomfort. No severe headache or neck pain. No acute neuro symptoms - no change in speech, no numbness/weakness, no problems w balance or coordination.     Pt currently appears stable for d/c. Rec close f/u with Dr Zadie Rhine tomorrow to clarify concerns/plan, and facilitate appropriate follow up.    Return precautions provided.    Lab Results  Component Value Date   WBC 7.4 07/23/2022   HGB 17.2 (H) 07/23/2022   HCT 47.5 07/23/2022   MCV 95.2 07/23/2022   PLT  215 07/23/2022   Lab Results  Component Value Date   CREATININE 1.05 07/23/2022   BUN 13 07/23/2022   NA 139 07/23/2022   K 4.3 07/23/2022   CL 103 07/23/2022   CO2 27 07/23/2022   Lab Results  Component Value Date   ALT 24 07/23/2022   AST 22 07/23/2022   ALKPHOS 66 07/23/2022   BILITOT 0.7 07/23/2022   EKG with NSR and first deg AV block /nonsp ST changes   CT from 12/4  CT Head Wo Contrast   Result Date: 07/23/2022 CLINICAL DATA:  Headache, new onset.  Pressure behind eyes. EXAM: CT HEAD WITHOUT CONTRAST TECHNIQUE: Contiguous axial images were obtained from the base of the skull through the vertex without intravenous contrast. RADIATION DOSE REDUCTION: This exam was performed according to the departmental dose-optimization program which includes automated exposure control, adjustment of the mA and/or kV according to patient size and/or use of iterative reconstruction technique. COMPARISON:  None Available. FINDINGS: Brain: No acute intracranial hemorrhage, midline shift or mass effect. No extra-axial fluid collection. Gray-white matter differentiation is within normal limits. No hydrocephalus. Vascular: No hyperdense vessel or unexpected calcification. Skull: No acute fracture. Surgical changes are noted at the mastoid on the right. Sinuses/Orbits: No acute finding. Other: None. IMPRESSION: No acute intracranial process. Electronically Signed   By:  Brett Fairy M.D.   On: 07/23/2022 20:46     Has appt to see Dr Zadie Rhine on 12/4  HTN bp is stable today  No cp or palpitations or headaches or edema  No side effects to medicines  BP Readings from Last 3 Encounters:  07/30/22 136/78  07/25/22 138/80  07/23/22 (!) 164/78    Pulse Readings from Last 3 Encounters:  07/30/22 65  07/25/22 60  07/23/22 68    Benicar 40 mg daily   Taking crestor for lipids Lab Results  Component Value Date   CHOL 133 10/27/2021   HDL 45.50 10/27/2021   LDLCALC 37 10/20/2019   LDLDIRECT 63.0  10/27/2021   TRIG 210.0 (H) 10/27/2021   CHOLHDL 3 10/27/2021   Notes recently when going up flight of stairs he gets winded more than he used to  For about a year  No chest pain or pressure Occ tingling in L arm - comes and goes (not exertional)  No ankle swelling except for late day   No nausea No sweating  No syncope or pre syncope   Still has a little bit of pressure in the eye  No loss of vision  Does shots for retinopathy - he has funky vision briefly with each shot   Dm has been in better control with A1c of 6.8   Using cpap for osa   Has knee arthroscopy planned 12/22   Patient Active Problem List   Diagnosis Date Noted   Dyspnea on exertion 07/30/2022   OSA (obstructive sleep apnea) 04/18/2022   Loud snoring 12/12/2021   Family history of colon cancer    Lump on finger 11/06/2021   Pseudophakia, left eye 10/26/2021   Pseudophakia, right eye 10/26/2021   Vitreomacular adhesion of left eye 09/21/2021   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/2021   Vitreous hemorrhage, left eye ([place]) 06/07/2021   Proliferative diabetic retinopathy of left eye with macular edema associated with type 2 diabetes mellitus ([place]) 06/07/2021   Localized swelling, mass and lump, upper limb 05/31/2021   Low vitamin D level 09/14/2020   Prostate cancer screening 09/02/2020   Current use of proton pump inhibitor 09/02/2020   Family history of factor V Leiden mutation 05/30/2020   Left ankle pain 01/29/2020   Diabetic macular edema of right eye with proliferative retinopathy associated with type 2 diabetes mellitus ([place]) 12/24/2019   Proliferative diabetic retinopathy of left eye determined by examination ([place]) 12/24/2019   Nuclear sclerotic cataract of left eye 12/24/2019   Low testosterone 11/13/2019   Fatigue 10/20/2019   Neck pain 04/28/2018   Need for hepatitis B screening test 03/26/2018   Need for hepatitis C screening test 03/26/2018   Paresthesia of both feet 11/01/2017    Hemorrhoids 10/14/2017   Class 2 obesity due to excess calories with body mass index (BMI) of 35.0 to 35.9 in adult 01/24/2016   Hypothyroidism 01/24/2016   Type 2 diabetes mellitus with ophthalmic complication ([place]) 26/83/4196   Alcohol consumption heavy 12/06/2014   Routine general medical examination at a health care facility 09/30/2013   Low back pain 06/17/2012   PLANTAR FASCIITIS, TRAUMATIC 22/29/7989   NEOPLASM UNCERTAIN BEHAVIOR OTHER SPEC SITES 10/07/2009   ESOPHAGEAL STRICTURE 06/27/2009   GERD 03/31/2009   Hyperlipidemia associated with type 2 diabetes mellitus ([place]) 01/06/2009   Essential hypertension 01/06/2009   Allergic rhinitis 01/06/2009   Past Medical History:  Diagnosis Date   Allergy    allegic rhinitis   Diabetes mellitus  type II   Esophageal stricture    GERD (gastroesophageal reflux disease)    History of hernia repair    as a baby   Hyperlipidemia    Hyperplastic polyp of intestine 2010   Hypertension    OSA (obstructive sleep apnea) 04/18/2022   Proliferative diabetic retinopathy of left eye ([place]) 03/31/2020   This is active as a patient still has tufts of the anterior segment neovascularization, rubeosis in the left eye every region of capillary nonperfusion should be treated with PRP.   S/P ear surgery, follow-up exam    for drainage   Vitreous hemorrhage of right eye ([place]) 12/24/2019   Past Surgical History:  Procedure Laterality Date   BIOPSY  12/07/2021   Procedure: BIOPSY;  Surgeon: Ladene Artist, MD;  Location: [facility];  Service: Gastroenterology;;   CATARACT EXTRACTION     COLONOSCOPY  03/07/2016   COLONOSCOPY WITH PROPOFOL N/A 12/07/2021   Procedure: COLONOSCOPY WITH PROPOFOL;  Surgeon: Ladene Artist, MD;  Location: [facility];  Service: Gastroenterology;  Laterality: N/A;   ESOPHAGOGASTRODUODENOSCOPY  04/20/2009   stricture/GERD   ESOPHAGOGASTRODUODENOSCOPY (EGD) WITH PROPOFOL N/A 12/07/2021   Procedure:  ESOPHAGOGASTRODUODENOSCOPY (EGD) WITH PROPOFOL;  Surgeon: Ladene Artist, MD;  Location: [facility];  Service: Gastroenterology;  Laterality: N/A;   EYE SURGERY     HERNIA REPAIR     age 29-midline   INNER EAR SURGERY  08/20/1986   went in behind [facility]   POLYPECTOMY     SAVORY DILATION N/A 12/07/2021   Procedure: SAVORY DILATION;  Surgeon: Ladene Artist, MD;  Location: [facility];  Service: Gastroenterology;  Laterality: N/A;   SHOULDER ARTHROSCOPY WITH ROTATOR CUFF REPAIR AND SUBACROMIAL DECOMPRESSION Right 09/29/2012   Procedure: SHOULDER ARTHROSCOPY WITH ROTATOR CUFF REPAIR AND SUBACROMIAL DECOMPRESSION;  Surgeon: Nita Sells, MD;  Location: [facility];  Service: Orthopedics;  Laterality: Right;  Right shoulder arthroscopy with subacromial decompression and distal clavicle excision & Debridement of Labrial Tear   SHOULDER SURGERY Left    x2   UPPER GASTROINTESTINAL ENDOSCOPY     Social History   Tobacco Use   Smoking status: Never   Smokeless tobacco: Never  Vaping Use   Vaping Use: Never used  Substance Use Topics   Alcohol use: Yes    Alcohol/week: 12.0 standard drinks of alcohol    Types: 12 Cans of beer per week   Drug use: No   Family History  Problem Relation Age of Onset   Diabetes Mother    Colon cancer Father 22   Diabetes Other    Colon polyps Brother    Diabetes Maternal Aunt    Cancer Cousin        breast cancer   Allergies  Allergen Reactions   Codeine     Anxious   Lipitor [Atorvastatin]     Muscle pain    Current Outpatient Medications on File Prior to Visit  Medication Sig Dispense Refill   cetirizine (ZYRTEC) 10 MG tablet Take 10 mg by mouth daily.     Cholecalciferol (VITAMIN D) 50 MCG (2000 UT) tablet Take 2,000 Units by mouth daily.     Continuous Blood Gluc Sensor (FREESTYLE LIBRE 3 SENSOR) MISC 1 applicator by Does not apply route every 14 (fourteen) days. 6 each  3   cyclobenzaprine (FLEXERIL) 10 MG tablet Take 1 tablet (10 mg total) by mouth 3 (three) times daily as needed. [place]  tablet 3   empagliflozin (JARDIANCE) 25 MG TABS tablet TAKE 1 TABLET BY MOUTH EVERY DAY BEFORE BREAKFAST 90 tablet 3   fluticasone (FLONASE) 50 MCG/ACT nasal spray Place 2 sprays into both nostrils daily as needed for allergies or rhinitis. 16 mL 3   insulin glargine (LANTUS SOLOSTAR) 100 UNIT/ML Solostar Pen INJECT 42 UNITS IN THE MORNING AND 50 UNITS IN THE EVENING 60 mL 3   insulin lispro (HUMALOG KWIKPEN) 100 UNIT/ML KwikPen INJECT 15-25 UNITS INTO THE SKIN 3 TIMES DAILY W/ MEALS AND 10-12 UNITS INTO THE SKIN W/ SNACKS 60 mL 3   Insulin Pen Needle (B-D UF III MINI PEN NEEDLES) 31G X 5 MM MISC USE AS DIRECTED 4 TIMES A DAY 100 each 1   levothyroxine (SYNTHROID) 75 MCG tablet Take 1 tablet (75 mcg total) by mouth daily before breakfast. 90 tablet 3   metFORMIN (GLUCOPHAGE) 1000 MG tablet Take 1 tablet (1,000 mg total) by mouth 2 (two) times daily with a meal. 90 tablet 3   olmesartan (BENICAR) 40 MG tablet TAKE 1 TABLET BY MOUTH EVERY DAY 90 tablet 2   omeprazole (PRILOSEC) 40 MG capsule TAKE 1 CAPSULE BY MOUTH EVERY DAY 90 capsule 0   ONETOUCH ULTRA test strip USE TO TEST BLOOD SUGAR 4 TIMES DAILY AS DIRECTED 400 strip 11   rosuvastatin (CRESTOR) 10 MG tablet TAKE 1 TABLET BY MOUTH EVERY DAY 30 tablet 11   tadalafil (CIALIS) 20 MG tablet Take 1 tablet (20 mg total) by mouth daily as needed for erectile dysfunction. 4 tablet 5   tirzepatide (MOUNJARO) 12.5 MG/0.5ML Pen Inject 12.5 mg into the skin once a week. 2 mL 5   vitamin B-12 (CYANOCOBALAMIN) 1000 MCG tablet Take 1,000 mcg by mouth daily.     No current facility-administered medications on file prior to visit.    Review of Systems  Constitutional:  Positive for fatigue. Negative for activity change, appetite change, fever and unexpected weight change.  HENT:  Negative for congestion, rhinorrhea, sore throat and trouble  swallowing.   Eyes:  Negative for pain, redness, itching and visual disturbance.       Has diabetic eye dz and getting injections   Respiratory:  Positive for shortness of breath. Negative for cough, choking, chest tightness, wheezing and stridor.   Cardiovascular:  Negative for chest pain, palpitations and leg swelling.  Gastrointestinal:  Negative for abdominal pain, blood in stool, constipation, diarrhea and nausea.  Endocrine: Negative for cold intolerance, heat intolerance, polydipsia and polyuria.  Genitourinary:  Negative for difficulty urinating, dysuria, frequency and urgency.  Musculoskeletal:  Negative for arthralgias, joint swelling and myalgias.  Skin:  Negative for pallor and rash.  Neurological:  Negative for dizziness, tremors, weakness, numbness and headaches.       Tingling in L arm occ  H/o neck surgery  Hematological:  Negative for adenopathy. Does not bruise/bleed easily.  Psychiatric/Behavioral:  Negative for decreased concentration and dysphoric mood. The patient is not nervous/anxious.        Objective:   Physical Exam Constitutional:      General: He is not in acute distress.    Appearance: Normal appearance. He is well-developed. He is obese. He is not ill-appearing or diaphoretic.  HENT:     Head: Normocephalic and atraumatic.  Eyes:     General: No scleral icterus.       Right eye: No discharge.        Left eye: No discharge.     Conjunctiva/sclera: Conjunctivae  normal.     Pupils: Pupils are equal, round, and reactive to light.  Neck:     Thyroid: No thyromegaly.     Vascular: No carotid bruit or JVD.  Cardiovascular:     Rate and Rhythm: Normal rate and regular rhythm.     Heart sounds: Normal heart sounds. No murmur heard.    No gallop.  Pulmonary:     Effort: Pulmonary effort is normal. No respiratory distress.     Breath sounds: Normal breath sounds. No wheezing or rales.  Abdominal:     General: There is no distension or abdominal bruit.      Palpations: Abdomen is soft. There is no mass.     Tenderness: There is no abdominal tenderness.  Musculoskeletal:     Cervical back: Normal range of motion and neck supple.     Right lower leg: No edema.     Left lower leg: No edema.     Comments: Mild sock line noted  Lymphadenopathy:     Cervical: No cervical adenopathy.  Skin:    General: Skin is warm and dry.     Coloration: Skin is not pale.     Findings: No rash.  Neurological:     Mental Status: He is alert.     Coordination: Coordination normal.     Deep Tendon Reflexes: Reflexes are normal and symmetric. Reflexes normal.  Psychiatric:        Mood and Affect: Mood normal.           Assessment & Plan:   Problem List Items Addressed This Visit       Cardiovascular and Mediastinum   Essential hypertension    bp in fair control at this time  BP Readings from Last 1 Encounters:  07/30/22 136/78  No changes needed Most recent labs reviewed  Disc lifstyle change with low sodium diet and exercise   Plan to continue benicar 40 mg daily        Relevant Orders   Ambulatory referral to Cardiology     Endocrine   Hyperlipidemia associated with type 2 diabetes mellitus ([place])    Disc goals for lipids and reasons to control them Rev last labs with pt Rev low sat fat diet in detail  Last LDL 63 Taking crestor 10 mg daily      Type 2 diabetes mellitus with ophthalmic complication ([facility])    Improved control  Last A1c 6.8  Under care of endocrinology         Other   Dyspnea on exertion - Primary    This is new  Also some L arm tingling  Had ER visit 12/4 -reassuring  Reviewed hospital records, lab results and studies in detail   Has risk factors for vasc dz incl DM, HTN and lipidemia  Ref made to cardiology for eval/tx  Also has knee surg planned 12/22   ER precautions reviewed today  Will watch for cp or pressure or new symptoms       Relevant Orders   Ambulatory referral to Cardiology

## 2022-07-30 NOTE — Patient Instructions (Signed)
I placed a referral to cardiology   If you don't get a call this week let us know   If your symptoms worsen or change let me know  See the eye doctor as planned

## 2022-07-30 NOTE — Assessment & Plan Note (Signed)
Disc goals for lipids and reasons to control them Rev last labs with pt Rev low sat fat diet in detail  Last LDL 63 Taking crestor 10 mg daily

## 2022-07-30 NOTE — Assessment & Plan Note (Signed)
bp in fair control at this time  BP Readings from Last 1 Encounters:  07/30/22 136/78   No changes needed Most recent labs reviewed  Disc lifstyle change with low sodium diet and exercise   Plan to continue benicar 40 mg daily

## 2022-07-30 NOTE — Assessment & Plan Note (Signed)
Improved control  Last A1c 6.8  Under care of endocrinology

## 2022-08-02 ENCOUNTER — Ambulatory Visit: Payer: 59 | Attending: Cardiology | Admitting: Cardiology

## 2022-08-02 ENCOUNTER — Encounter: Payer: Self-pay | Admitting: *Deleted

## 2022-08-02 ENCOUNTER — Encounter: Payer: Self-pay | Admitting: Cardiology

## 2022-08-02 ENCOUNTER — Telehealth (HOSPITAL_COMMUNITY): Payer: Self-pay

## 2022-08-02 VITALS — BP 140/70 | HR 80 | Ht 69.0 in | Wt 220.0 lb

## 2022-08-02 DIAGNOSIS — E1169 Type 2 diabetes mellitus with other specified complication: Secondary | ICD-10-CM

## 2022-08-02 DIAGNOSIS — Z01818 Encounter for other preprocedural examination: Secondary | ICD-10-CM | POA: Diagnosis not present

## 2022-08-02 DIAGNOSIS — R0609 Other forms of dyspnea: Secondary | ICD-10-CM

## 2022-08-02 DIAGNOSIS — I10 Essential (primary) hypertension: Secondary | ICD-10-CM

## 2022-08-02 DIAGNOSIS — E785 Hyperlipidemia, unspecified: Secondary | ICD-10-CM

## 2022-08-02 NOTE — Patient Instructions (Signed)
Medication Instructions:  The current medical regimen is effective;  continue present plan and medications.  *If you need a refill on your cardiac medications before your next appointment, please call your pharmacy*  Testing/Procedures: Your physician has requested that you have a lexiscan myoview. For further information please visit [email or web address]. Please follow instruction sheet, as given.  Your physician has requested that you have an echocardiogram. Echocardiography is a painless test that uses sound waves to create images of your heart. It provides your doctor with information about the size and shape of your heart and how well your heart's chambers and valves are working. This procedure takes approximately one hour. There are no restrictions for this procedure. Please do NOT wear cologne, perfume, aftershave, or lotions (deodorant is allowed). Please arrive 15 minutes prior to your appointment time.   Follow-Up: At [facility], you and your health needs are our priority.  As part of our continuing mission to provide you with exceptional heart care, we have created designated Provider Care Teams.  These Care Teams include your primary Cardiologist (physician) and Advanced Practice Providers (APPs -  Physician Assistants and Nurse Practitioners) who all work together to provide you with the care you need, when you need it.  We recommend signing up for the patient portal called "MyChart".  Sign up information is provided on this After Visit Summary.  MyChart is used to connect with patients for Virtual Visits (Telemedicine).  Patients are able to view lab/test results, encounter notes, upcoming appointments, etc.  Non-urgent messages can be sent to your provider as well.   To learn more about what you can do with MyChart, go to [email or web address].    Your next appointment:   3 month(s)  The format for your next appointment:   In Person  Provider:   Candee Furbish, MD       Important Information About Sugar

## 2022-08-02 NOTE — Telephone Encounter (Signed)
Detailed instructions left on the patient's answering machine. Asked to call back with any questions. S.Lauramae Kneisley EMTP 

## 2022-08-02 NOTE — Progress Notes (Signed)
Cardiology Office Note:    Date:  08/02/2022   ID:  Mitchell Palmer, DOB 07-11-1966, MRN [id number]  PCP:  Abner Greenspan, MD   [place] [facility] Cardiologist:  Candee Furbish, MD     Referring MD: Abner Greenspan, MD    History of Present Illness:    Mitchell Palmer is a 56 y.o. male here for the evaluation of dyspnea on exertion at the request of Dr. Glori Bickers.  Had been experiencing new dyspnea on exertion with left arm tingling. 3 flight felt winded.  ER visit in December 4 overall no evidence of ACS.  Normal troponin.  Hospital records reviewed in detail.  Diabetes Hypertension Hyperlipidemia  Grandparent MI  Is planning surgery on 12/22. Dr. Berenice Primas.   Mild right burning calf. Lower   CPAP for OSA  Past Medical History:  Diagnosis Date   Allergy    allegic rhinitis   Diabetes mellitus    type II   Esophageal stricture    GERD (gastroesophageal reflux disease)    History of hernia repair    as a baby   Hyperlipidemia    Hyperplastic polyp of intestine 2010   Hypertension    OSA (obstructive sleep apnea) 04/18/2022   Proliferative diabetic retinopathy of left eye ([place]) 03/31/2020   This is active as a patient still has tufts of the anterior segment neovascularization, rubeosis in the left eye every region of capillary nonperfusion should be treated with PRP.   S/P ear surgery, follow-up exam    for drainage   Vitreous hemorrhage of right eye ([place]) 12/24/2019    Past Surgical History:  Procedure Laterality Date   BIOPSY  12/07/2021   Procedure: BIOPSY;  Surgeon: Ladene Artist, MD;  Location: [facility];  Service: Gastroenterology;;   CATARACT EXTRACTION     COLONOSCOPY  03/07/2016   COLONOSCOPY WITH PROPOFOL N/A 12/07/2021   Procedure: COLONOSCOPY WITH PROPOFOL;  Surgeon: Ladene Artist, MD;  Location: [facility];  Service: Gastroenterology;  Laterality: N/A;   ESOPHAGOGASTRODUODENOSCOPY  04/20/2009   stricture/GERD   ESOPHAGOGASTRODUODENOSCOPY  (EGD) WITH PROPOFOL N/A 12/07/2021   Procedure: ESOPHAGOGASTRODUODENOSCOPY (EGD) WITH PROPOFOL;  Surgeon: Ladene Artist, MD;  Location: [facility];  Service: Gastroenterology;  Laterality: N/A;   EYE SURGERY     HERNIA REPAIR     age 68-midline   INNER EAR SURGERY  08/20/1986   went in behind [facility]   POLYPECTOMY     SAVORY DILATION N/A 12/07/2021   Procedure: SAVORY DILATION;  Surgeon: Ladene Artist, MD;  Location: [facility];  Service: Gastroenterology;  Laterality: N/A;   SHOULDER ARTHROSCOPY WITH ROTATOR CUFF REPAIR AND SUBACROMIAL DECOMPRESSION Right 09/29/2012   Procedure: SHOULDER ARTHROSCOPY WITH ROTATOR CUFF REPAIR AND SUBACROMIAL DECOMPRESSION;  Surgeon: Nita Sells, MD;  Location: [facility];  Service: Orthopedics;  Laterality: Right;  Right shoulder arthroscopy with subacromial decompression and distal clavicle excision & Debridement of Labrial Tear   SHOULDER SURGERY Left    x2   UPPER GASTROINTESTINAL ENDOSCOPY      Current Medications: Current Meds  Medication Sig   cetirizine (ZYRTEC) 10 MG tablet Take 10 mg by mouth daily.   Cholecalciferol (VITAMIN D) 50 MCG (2000 UT) tablet Take 2,000 Units by mouth daily.   Continuous Blood Gluc Sensor (FREESTYLE LIBRE 3 SENSOR) MISC 1 applicator by Does not apply route every 14 (fourteen) days.   cyclobenzaprine (FLEXERIL) 10 MG tablet  Take 1 tablet (10 mg total) by mouth 3 (three) times daily as needed.   empagliflozin (JARDIANCE) 25 MG TABS tablet TAKE 1 TABLET BY MOUTH EVERY DAY BEFORE BREAKFAST   fluticasone (FLONASE) 50 MCG/ACT nasal spray Place 2 sprays into both nostrils daily as needed for allergies or rhinitis.   insulin glargine (LANTUS SOLOSTAR) 100 UNIT/ML Solostar Pen INJECT 42 UNITS IN THE MORNING AND 50 UNITS IN THE EVENING   insulin lispro (HUMALOG KWIKPEN) 100 UNIT/ML KwikPen INJECT 15-25 UNITS INTO THE SKIN 3 TIMES DAILY W/ MEALS AND 10-12  UNITS INTO THE SKIN W/ SNACKS   Insulin Pen Needle (B-D UF III MINI PEN NEEDLES) 31G X 5 MM MISC USE AS DIRECTED 4 TIMES A DAY   levothyroxine (SYNTHROID) 75 MCG tablet Take 1 tablet (75 mcg total) by mouth daily before breakfast.   metFORMIN (GLUCOPHAGE) 1000 MG tablet Take 1 tablet (1,000 mg total) by mouth 2 (two) times daily with a meal. (Patient taking differently: Take 1,000 mg by mouth daily.)   olmesartan (BENICAR) 40 MG tablet TAKE 1 TABLET BY MOUTH EVERY DAY   omeprazole (PRILOSEC) 40 MG capsule TAKE 1 CAPSULE BY MOUTH EVERY DAY   ONETOUCH ULTRA test strip USE TO TEST BLOOD SUGAR 4 TIMES DAILY AS DIRECTED   rosuvastatin (CRESTOR) 10 MG tablet TAKE 1 TABLET BY MOUTH EVERY DAY   tadalafil (CIALIS) 20 MG tablet Take 1 tablet (20 mg total) by mouth daily as needed for erectile dysfunction.   tirzepatide (MOUNJARO) 12.5 MG/0.5ML Pen Inject 12.5 mg into the skin once a week.   vitamin B-12 (CYANOCOBALAMIN) 500 MCG tablet Take 500 mcg by mouth daily.     Allergies:   Codeine and Lipitor [atorvastatin]   Social History   Socioeconomic History   Marital status: Single    Spouse name: Not on file   Number of children: Not on file   Years of education: Not on file   Highest education level: Not on file  Occupational History   Occupation: Mining engineer  Tobacco Use   Smoking status: Never   Smokeless tobacco: Never  Vaping Use   Vaping Use: Never used  Substance and Sexual Activity   Alcohol use: Yes    Alcohol/week: 12.0 standard drinks of alcohol    Types: 12 Cans of beer per week   Drug use: No   Sexual activity: Not on file  Other Topics Concern   Not on file  Social History Narrative   Not on file   Social Determinants of Health   Financial Resource Strain: Not on file  Food Insecurity: Not on file  Transportation Needs: Not on file  Physical Activity: Not on file  Stress: Not on file  Social Connections: Not on file     Family History: The patient's family history  includes Cancer in his cousin; Colon cancer (age of onset: 52) in his father; Colon polyps in his brother; Diabetes in his maternal aunt, mother, and another family member.  ROS:   Please see the history of present illness.    No fevers chills nausea vomiting syncope.  All other systems reviewed and are negative.  EKGs/Labs/Other Studies Reviewed:    The following studies were reviewed today: ER visit reviewed EKG reviewed  EKG:   07/23/2022-heart rate 70s with first-degree AV block 226 ms.  No ST changes  Recent Labs: 10/27/2021: TSH 4.19 07/23/2022: ALT 24; BUN 13; Creatinine, Ser 1.05; Hemoglobin 17.2; Platelets 215; Potassium 4.3; Sodium 139  Recent Lipid Panel  Component Value Date/Time   CHOL 133 10/27/2021 0745   TRIG 210.0 (H) 10/27/2021 0745   HDL 45.50 10/27/2021 0745   CHOLHDL 3 10/27/2021 0745   VLDL 42.0 (H) 10/27/2021 0745   LDLCALC 37 10/20/2019 0944   LDLDIRECT 63.0 10/27/2021 0745     Risk Assessment/Calculations:           Physical Exam:    VS:  BP (!) 140/70 (BP Location: Right Arm, Patient Position: Sitting, Cuff Size: Normal)   Pulse 80   Ht '5\' 9"'$  (1.753 m)   Wt 220 lb (99.8 kg)   SpO2 98%   BMI 32.49 kg/m     Wt Readings from Last 3 Encounters:  08/02/22 220 lb (99.8 kg)  07/30/22 223 lb (101.2 kg)  07/25/22 221 lb 9.6 oz (100.5 kg)     GEN:  Well nourished, well developed in no acute distress HEENT: Normal NECK: No JVD; No carotid bruits LYMPHATICS: No lymphadenopathy CARDIAC: RRR, no murmurs, rubs, gallops RESPIRATORY:  Clear to auscultation without rales, wheezing or rhonchi  ABDOMEN: Soft, non-tender, non-distended MUSCULOSKELETAL:  No edema; No deformity  SKIN: Warm and dry NEUROLOGIC:  Alert and oriented x 3 PSYCHIATRIC:  Normal affect   ASSESSMENT:    1. Pre-op evaluation   2. Hyperlipidemia associated with type 2 diabetes mellitus ([place])   3. Dyspnea on exertion   4. Essential hypertension    PLAN:    In order of  problems listed above:  56 year old with type 2 diabetes dyspnea on exertion possible anginal equivalent with left arm tingling while going upstairs -We will go ahead and check a pharmacologic stress test.  He has an upcoming knee surgery on 08/10/2022 with Dr. Dorna Leitz of orthopedics.  If stress test is overall low risk he may proceed with surgery with low overall cardiac risk.  We will check an echocardiogram as well. - Continue with blood pressure control, on olmesartan 40 mg.  He is also on Jardiance 25 mg for his diabetes which is also beneficial from a cardiac perspective. - Takes Crestor 10 mg once a day as well excellent with his underlying diabetes.  His LDL is 37 back in 2021 fantastic.  Creatinine 1.0 hemoglobin 17.2.  Hemoglobin A1c 6.8.  We will see him back in 3 months.  If he continues to have significant issues, we will have low threshold for coronary CT scan.  I would not be able to obtain this test prior to his upcoming surgery.  He does have some occasional calf pain.  At next visit we will consider checking vascular ultrasound arterial to make sure he does not have evidence of peripheral arterial disease with his underlying diabetes.     Shared Decision Making/Informed Consent The risks [chest pain, shortness of breath, cardiac arrhythmias, dizziness, blood pressure fluctuations, myocardial infarction, stroke/transient ischemic attack, nausea, vomiting, allergic reaction, radiation exposure, metallic taste sensation and life-threatening complications (estimated to be 1 in 10,000)], benefits (risk stratification, diagnosing coronary artery disease, treatment guidance) and alternatives of a nuclear stress test were discussed in detail with Mitchell Palmer and he agrees to proceed.    Medication Adjustments/Labs and Tests Ordered: Current medicines are reviewed at length with the patient today.  Concerns regarding medicines are outlined above.  Orders Placed This Encounter   Procedures   Cardiac Stress Test: Informed Consent Details: Physician/Practitioner Attestation; Transcribe to consent form and obtain patient signature   MYOCARDIAL PERFUSION IMAGING   ECHOCARDIOGRAM COMPLETE   No orders of the defined  types were placed in this encounter.   Patient Instructions  Medication Instructions:  The current medical regimen is effective;  continue present plan and medications.  *If you need a refill on your cardiac medications before your next appointment, please call your pharmacy*  Testing/Procedures: Your physician has requested that you have a lexiscan myoview. For further information please visit [email or web address]. Please follow instruction sheet, as given.  Your physician has requested that you have an echocardiogram. Echocardiography is a painless test that uses sound waves to create images of your heart. It provides your doctor with information about the size and shape of your heart and how well your heart's chambers and valves are working. This procedure takes approximately one hour. There are no restrictions for this procedure. Please do NOT wear cologne, perfume, aftershave, or lotions (deodorant is allowed). Please arrive 15 minutes prior to your appointment time.   Follow-Up: At [facility], you and your health needs are our priority.  As part of our continuing mission to provide you with exceptional heart care, we have created designated Provider Care Teams.  These Care Teams include your primary Cardiologist (physician) and Advanced Practice Providers (APPs -  Physician Assistants and Nurse Practitioners) who all work together to provide you with the care you need, when you need it.  We recommend signing up for the patient portal called "MyChart".  Sign up information is provided on this After Visit Summary.  MyChart is used to connect with patients for Virtual Visits (Telemedicine).  Patients are able to view lab/test results, encounter  notes, upcoming appointments, etc.  Non-urgent messages can be sent to your provider as well.   To learn more about what you can do with MyChart, go to [email or web address].    Your next appointment:   3 month(s)  The format for your next appointment:   In Person  Provider:   Candee Furbish, MD      Important Information About Sugar         Signed, Candee Furbish, MD  08/02/2022 12:32 PM    [place]

## 2022-08-07 ENCOUNTER — Ambulatory Visit (HOSPITAL_COMMUNITY): Payer: 59 | Attending: Cardiology

## 2022-08-07 DIAGNOSIS — R0609 Other forms of dyspnea: Secondary | ICD-10-CM | POA: Insufficient documentation

## 2022-08-07 DIAGNOSIS — I10 Essential (primary) hypertension: Secondary | ICD-10-CM | POA: Diagnosis not present

## 2022-08-07 DIAGNOSIS — Z01818 Encounter for other preprocedural examination: Secondary | ICD-10-CM | POA: Insufficient documentation

## 2022-08-07 LAB — MYOCARDIAL PERFUSION IMAGING
LV dias vol: 92 mL (ref 62–150)
LV sys vol: 43 mL
Nuc Stress EF: 53 %
Peak HR: 91 {beats}/min
Rest HR: 57 {beats}/min
Rest Nuclear Isotope Dose: 10.9 mCi
SDS: 1
SRS: 0
SSS: 1
ST Depression (mm): 0 mm
Stress Nuclear Isotope Dose: 31 mCi
TID: 1.03

## 2022-08-07 MED ORDER — REGADENOSON 0.4 MG/5ML IV SOLN
0.4000 mg | Freq: Once | INTRAVENOUS | Status: AC
  Administered 2022-08-07: 0.4 mg via INTRAVENOUS

## 2022-08-07 MED ORDER — TECHNETIUM TC 99M TETROFOSMIN IV KIT
31.0000 | PACK | Freq: Once | INTRAVENOUS | Status: AC | PRN
  Administered 2022-08-07: 31 via INTRAVENOUS

## 2022-08-07 MED ORDER — TECHNETIUM TC 99M TETROFOSMIN IV KIT
10.9000 | PACK | Freq: Once | INTRAVENOUS | Status: AC | PRN
  Administered 2022-08-07: 10.9 via INTRAVENOUS

## 2022-08-09 ENCOUNTER — Encounter: Payer: Self-pay | Admitting: Family Medicine

## 2022-08-10 ENCOUNTER — Other Ambulatory Visit: Payer: Self-pay | Admitting: Family Medicine

## 2022-08-30 LAB — HM DIABETES EYE EXAM

## 2022-09-02 ENCOUNTER — Other Ambulatory Visit: Payer: Self-pay | Admitting: Internal Medicine

## 2022-09-03 ENCOUNTER — Ambulatory Visit (HOSPITAL_COMMUNITY): Payer: 59 | Attending: Cardiology

## 2022-09-03 DIAGNOSIS — Z01818 Encounter for other preprocedural examination: Secondary | ICD-10-CM | POA: Diagnosis not present

## 2022-09-03 DIAGNOSIS — R0609 Other forms of dyspnea: Secondary | ICD-10-CM | POA: Insufficient documentation

## 2022-09-03 DIAGNOSIS — I10 Essential (primary) hypertension: Secondary | ICD-10-CM

## 2022-09-03 LAB — ECHOCARDIOGRAM COMPLETE
Area-P 1/2: 4.99 cm2
Est EF: 55
S' Lateral: 3.4 cm

## 2022-09-11 LAB — HM DIABETES EYE EXAM

## 2022-09-12 ENCOUNTER — Encounter: Payer: Self-pay | Admitting: Internal Medicine

## 2022-09-26 ENCOUNTER — Encounter: Payer: Self-pay | Admitting: Internal Medicine

## 2022-10-01 ENCOUNTER — Telehealth: Payer: Self-pay

## 2022-10-01 ENCOUNTER — Other Ambulatory Visit (HOSPITAL_COMMUNITY): Payer: Self-pay

## 2022-10-01 DIAGNOSIS — E11319 Type 2 diabetes mellitus with unspecified diabetic retinopathy without macular edema: Secondary | ICD-10-CM

## 2022-10-01 MED ORDER — MOUNJARO 15 MG/0.5ML ~~LOC~~ SOAJ
15.0000 mg | SUBCUTANEOUS | 5 refills | Status: DC
  Filled 2022-10-01: qty 2, 28d supply, fill #0

## 2022-10-01 NOTE — Telephone Encounter (Signed)
Rx sent to requested pharmacy for 15 mg Mounjaro.

## 2022-10-01 NOTE — Telephone Encounter (Signed)
Pt called to advise [facility] has the 15 mg dose of Mounjaro and [facility] on [address] has the 10 mg dose available. States if you want to increase his dose we can sent a rx to [facility] and if we want to decrease we can send it to [facility].

## 2022-10-04 ENCOUNTER — Encounter: Payer: Self-pay | Admitting: Family Medicine

## 2022-10-16 ENCOUNTER — Other Ambulatory Visit: Payer: Self-pay | Admitting: Internal Medicine

## 2022-10-17 ENCOUNTER — Other Ambulatory Visit (HOSPITAL_COMMUNITY): Payer: Self-pay

## 2022-10-17 ENCOUNTER — Other Ambulatory Visit: Payer: Self-pay | Admitting: Family Medicine

## 2022-10-21 ENCOUNTER — Other Ambulatory Visit: Payer: Self-pay | Admitting: Family Medicine

## 2022-10-23 ENCOUNTER — Other Ambulatory Visit: Payer: Self-pay | Admitting: Orthopedic Surgery

## 2022-10-23 DIAGNOSIS — M545 Low back pain, unspecified: Secondary | ICD-10-CM

## 2022-10-23 DIAGNOSIS — M542 Cervicalgia: Secondary | ICD-10-CM

## 2022-11-13 ENCOUNTER — Ambulatory Visit
Admission: RE | Admit: 2022-11-13 | Discharge: 2022-11-13 | Disposition: A | Discharge status: Home / Self Care | Payer: 59 | Source: Ambulatory Visit | Attending: Orthopedic Surgery | Admitting: Orthopedic Surgery

## 2022-11-13 DIAGNOSIS — M545 Low back pain, unspecified: Secondary | ICD-10-CM

## 2022-11-13 DIAGNOSIS — M542 Cervicalgia: Secondary | ICD-10-CM

## 2022-11-20 ENCOUNTER — Ambulatory Visit: Payer: 59 | Admitting: Cardiology

## 2022-11-27 ENCOUNTER — Ambulatory Visit (INDEPENDENT_AMBULATORY_CARE_PROVIDER_SITE_OTHER): Payer: 59 | Admitting: Internal Medicine

## 2022-11-27 ENCOUNTER — Encounter: Payer: Self-pay | Admitting: Internal Medicine

## 2022-11-27 VITALS — BP 130/84 | HR 74 | Ht 69.0 in | Wt 219.0 lb

## 2022-11-27 DIAGNOSIS — E11319 Type 2 diabetes mellitus with unspecified diabetic retinopathy without macular edema: Secondary | ICD-10-CM

## 2022-11-27 DIAGNOSIS — E039 Hypothyroidism, unspecified: Secondary | ICD-10-CM | POA: Diagnosis not present

## 2022-11-27 DIAGNOSIS — Z794 Long term (current) use of insulin: Secondary | ICD-10-CM

## 2022-11-27 DIAGNOSIS — E7800 Pure hypercholesterolemia, unspecified: Secondary | ICD-10-CM

## 2022-11-27 LAB — POCT GLYCOSYLATED HEMOGLOBIN (HGB A1C): Hemoglobin A1C: 7.3 % — AB (ref 4.0–5.6)

## 2022-11-27 NOTE — Progress Notes (Signed)
Subjective:     Patient ID: Mitchell Palmer, male   DOB: [birth year], 57 y.o.   MRN: [id number]   HPI Mitchell Palmer is a 57 y.o. man, returning for followup for DM2, dx in 2000, uncontrolled, insulin-dependent, with complications (CKD stage 3, erectile dysfunction, mild DR). Last visit 4 months ago.  Interim history: He was prev. shoulder and knee steroid injections >> had shoulder sx 07/25/2021.  He is occasionally getting steroid injections. He had R knee surgery in 07/2022. Still needs steroid inj's. Last inj helped a lot - 10/15/2022. He has spinal stenosis - may need steroid inj's at that time. No increased urination, nausea, chest pain. He has blurry vision in R eye - gets IO inj's.  Reviewed HbA1c levels: Lab Results  Component Value Date   HGBA1C 6.8 (A) 07/25/2022   HGBA1C 7.3 (A) 03/21/2022   HGBA1C 8.3 (A) 11/06/2021   HGBA1C 8.1 (A) 02/22/2021   HGBA1C 8.0 (A) 10/17/2020   HGBA1C 7.8 (A) 05/11/2020   HGBA1C 7.5 (A) 01/07/2020   HGBA1C 7.5 (A) 09/08/2019   HGBA1C 7.1 (A) 04/16/2019   HGBA1C 7.6 (A) 09/03/2018   HGBA1C 8.2 (A) 05/21/2018   HGBA1C 7.4 (A) 01/27/2018   HGBA1C 7.6 10/21/2017   HGBA1C 8.0 05/10/2017   HGBA1C 7.4 11/15/2016   HGBA1C 7.6 08/15/2016   HGBA1C 7.7 05/15/2016   HGBA1C 7.8 02/15/2016   HGBA1C 7.7 11/08/2015   HGBA1C 8.0 (H) 07/22/2015   He is on: - Metformin 1000 mg 2x a day with meals >> 1000 mg x1 a day - moved with the last meal of his day - Jardiance 25 mg daily - Mounjaro 10 >> 12.5 >> 15 units weekly - Lantus 42 units at waking up and 50 units at bedtime >> 40 units 2x a day >> 40-46 units 2x a day - Humalog 0-35 units for breakfast but 20-25 units before the rest of the meals Uses 10-15 units for correction. He was previously on Trulicity.  Also, on Basaglar. He was on: - he stopped  Invokana 08/2016 as he had leg tingling - Cycloset - started 04/22/2013 >> at 1.6 mg daily (tried 3 tabs >> dizziness >> backed off to 2) - Victoza 1.8 mg  daily - Actos 45 mg daily - Januvia - glyburide/metformin - Avandia. - Bydureon 2 mg weekly - he hated the thick needle   He is checking his CBG more than 4 times a day with his newly acquired freestyle libre 3 CGM:  Previously:  Previously:  Lowest: 52 >> 59 (took too much Humalog for pizza) >> 60s;  He has hypoglycemia awareness in the 80s. Highest 400 (steroids) >> 300 >> 331 >> 200s >> 300s.  He works nights: 6 PM to 7:30 AM 3-4 days a week.  Meals: - 5:30-6 pm (at work): subway sandwich >> 2 hot dogs + doritos >> chicken biscuit - snack at 9-10 pm: pistachios or 1/2 sandwich; pizza  >> 12 am: nabs +peanuts, apples - 11 am-12 pm: salad, meat + veggies + rice (leftovers from take out), pizza rarely  + HL. Last lipid panel: Lab Results  Component Value Date   CHOL 133 10/27/2021   HDL 45.50 10/27/2021   LDLCALC 37 10/20/2019   LDLDIRECT 63.0 10/27/2021   TRIG 210.0 (H) 10/27/2021   CHOLHDL 3 10/27/2021  On Lipitor 40.  -+ Stage II CKD, last BUN/Cr:  Lab Results  Component Value Date   BUN 13 07/23/2022   CREATININE 1.05 07/23/2022   Lab  Results  Component Value Date   MICRALBCREAT 0.9 02/15/2016   MICRALBCREAT 1.0 12/07/2014   MICRALBCREAT 0.5 11/04/2012  On olmesartan.  - last eye exam was 09/11/2022: + DR.  Dr. Dione Booze >> now Dr. Luciana Axe.  He had laser surgery [facility].  Also, In 07/2018 >> had EMG surgery in his R eye for bleeding. He had cataract sx (Dr. Dione Booze), another surgery (Dr. Luciana Axe) on R eye in 2021.  - no numbness or tingling in his legs.  Foot exam was done in 10/2021.  He also has a history of HTN, GERD, esophageal stricture, plantar fasciitis, eustachian tube dysfunction - status post ear surgery.  Hypothyroidism: -Started levothyroxine 01/2016 -He was skipping doses in the past and TFTs were fluctuating.  Pt is on levothyroxine 75 mcg daily, taken: - in am - fasting - at least 30 min from b'fast - no calcium - no iron - no multivitamins - no  PPIs - not on Biotin  Reviewed his TFTs: Lab Results  Component Value Date   TSH 4.19 10/27/2021   TSH 2.47 09/02/2020   TSH 1.85 10/20/2019   TSH 2.95 10/21/2018   TSH 5.18 (H) 09/18/2018   TSH 4.25 10/31/2017   TSH 1.17 09/21/2016   TSH 3.05 04/24/2016   TSH 4.97 (H) 03/06/2016   TSH 8.97 (H) 01/23/2016   In 07/2018, he had C spine surgery.  Review of Systems + see HPI  I reviewed pt's medications, allergies, PMH, social hx, family hx, and changes were documented in the history of present illness. Otherwise, unchanged from my initial visit note.  Past Medical History:  Diagnosis Date   Allergy    allegic rhinitis   Diabetes mellitus    type II   Esophageal stricture    GERD (gastroesophageal reflux disease)    History of hernia repair    as a baby   Hyperlipidemia    Hyperplastic polyp of intestine 2010   Hypertension    OSA (obstructive sleep apnea) 04/18/2022   Proliferative diabetic retinopathy of left eye (HCC) 03/31/2020   This is active as a patient still has tufts of the anterior segment neovascularization, rubeosis in the left eye every region of capillary nonperfusion should be treated with PRP.   S/P ear surgery, follow-up exam    for drainage   Vitreous hemorrhage of right eye (HCC) 12/24/2019   Past Surgical History:  Procedure Laterality Date   BIOPSY  12/07/2021   Procedure: BIOPSY;  Surgeon: Meryl Dare, MD;  Location: [facility];  Service: Gastroenterology;;   CATARACT EXTRACTION     COLONOSCOPY  03/07/2016   COLONOSCOPY WITH PROPOFOL N/A 12/07/2021   Procedure: COLONOSCOPY WITH PROPOFOL;  Surgeon: Meryl Dare, MD;  Location: [facility];  Service: Gastroenterology;  Laterality: N/A;   ESOPHAGOGASTRODUODENOSCOPY  04/20/2009   stricture/GERD   ESOPHAGOGASTRODUODENOSCOPY (EGD) WITH PROPOFOL N/A 12/07/2021   Procedure: ESOPHAGOGASTRODUODENOSCOPY (EGD) WITH PROPOFOL;  Surgeon: Meryl Dare, MD;  Location: [facility];  Service:  Gastroenterology;  Laterality: N/A;   EYE SURGERY     HERNIA REPAIR     age 41-midline   INNER EAR SURGERY  08/20/1986   went in behind rt ear-blockage   NECK SURGERY     [facility]   POLYPECTOMY     SAVORY DILATION N/A 12/07/2021   Procedure: SAVORY DILATION;  Surgeon: Meryl Dare, MD;  Location: [facility];  Service: Gastroenterology;  Laterality: N/A;   SHOULDER ARTHROSCOPY WITH ROTATOR CUFF REPAIR AND SUBACROMIAL DECOMPRESSION Right 09/29/2012  Procedure: SHOULDER ARTHROSCOPY WITH ROTATOR CUFF REPAIR AND SUBACROMIAL DECOMPRESSION;  Surgeon: Mable ParisJustin William Chandler, MD;  Location: [facility];  Service: Orthopedics;  Laterality: Right;  Right shoulder arthroscopy with subacromial decompression and distal clavicle excision & Debridement of Labrial Tear   SHOULDER SURGERY Left    x2   UPPER GASTROINTESTINAL ENDOSCOPY     Social History   Socioeconomic History   Marital status: Single    Spouse name: Not on file   Number of children: Not on file   Years of education: Not on file   Highest education level: Not on file  Occupational History   Occupation: Designer, television/film setperator  Tobacco Use   Smoking status: Never   Smokeless tobacco: Never  Vaping Use   Vaping Use: Never used  Substance and Sexual Activity   Alcohol use: Yes    Alcohol/week: 12.0 standard drinks of alcohol    Types: 12 Cans of beer per week   Drug use: No   Sexual activity: Not on file  Other Topics Concern   Not on file  Social History Narrative   Not on file   Social Determinants of Health   Financial Resource Strain: Not on file  Food Insecurity: Not on file  Transportation Needs: Not on file  Physical Activity: Not on file  Stress: Not on file  Social Connections: Not on file  Intimate Partner Violence: Not on file   Current Outpatient Medications on File Prior to Visit  Medication Sig Dispense Refill   cetirizine (ZYRTEC) 10 MG tablet Take 10 mg by mouth daily.     Cholecalciferol  (VITAMIN D) 50 MCG (2000 UT) tablet Take 2,000 Units by mouth daily.     Continuous Blood Gluc Sensor (FREESTYLE LIBRE 3 SENSOR) MISC 1 applicator by Does not apply route every 14 (fourteen) days. 6 each 3   cyclobenzaprine (FLEXERIL) 10 MG tablet Take 1 tablet (10 mg total) by mouth 3 (three) times daily as needed. 30 tablet 3   empagliflozin (JARDIANCE) 25 MG TABS tablet TAKE 1 TABLET BY MOUTH EVERY DAY BEFORE BREAKFAST 90 tablet 3   fluticasone (FLONASE) 50 MCG/ACT nasal spray PLACE 2 SPRAYS INTO BOTH NOSTRILS DAILY AS NEEDED FOR ALLERGIES OR RHINITIS. 48 mL 0   insulin glargine (LANTUS SOLOSTAR) 100 UNIT/ML Solostar Pen INJECT 42 UNITS IN THE MORNING AND 50 UNITS IN THE EVENING 60 mL 3   insulin lispro (HUMALOG KWIKPEN) 100 UNIT/ML KwikPen INJECT 15-25 UNITS INTO THE SKIN 3 TIMES DAILY W/ MEALS AND 10-12 UNITS INTO THE SKIN W/ SNACKS 60 mL 3   Insulin Pen Needle (B-D UF III MINI PEN NEEDLES) 31G X 5 MM MISC USE AS DIRECTED 4 TIMES A DAY 100 each 3   levothyroxine (SYNTHROID) 75 MCG tablet Take 1 tablet (75 mcg total) by mouth daily before breakfast. 90 tablet 3   metFORMIN (GLUCOPHAGE) 1000 MG tablet Take 1 tablet (1,000 mg total) by mouth 2 (two) times daily with a meal. (Patient taking differently: Take 1,000 mg by mouth daily.) 90 tablet 3   olmesartan (BENICAR) 40 MG tablet TAKE 1 TABLET BY MOUTH EVERY DAY 90 tablet 1   omeprazole (PRILOSEC) 40 MG capsule TAKE 1 CAPSULE BY MOUTH EVERY DAY 90 capsule 1   ONETOUCH ULTRA test strip USE TO TEST BLOOD SUGAR 4 TIMES DAILY AS DIRECTED 400 strip 11   rosuvastatin (CRESTOR) 10 MG tablet TAKE 1 TABLET BY MOUTH EVERY DAY 30 tablet 11   tadalafil (CIALIS) 20 MG tablet Take  1 tablet (20 mg total) by mouth daily as needed for erectile dysfunction. 4 tablet 5   tirzepatide (MOUNJARO) 12.5 MG/0.5ML Pen Inject 12.5 mg into the skin once a week. 2 mL 5   tirzepatide (MOUNJARO) 15 MG/0.5ML Pen Inject 15 mg into the skin once a week. 2 mL 5   vitamin B-12  (CYANOCOBALAMIN) 500 MCG tablet Take 500 mcg by mouth daily.     No current facility-administered medications on file prior to visit.   Allergies  Allergen Reactions   Codeine     Anxious   Lipitor [Atorvastatin]     Muscle pain    Family History  Problem Relation Age of Onset   Diabetes Mother    Colon cancer Father 90   Diabetes Other    Colon polyps Brother    Diabetes Maternal Aunt    Cancer Cousin        breast cancer     Objective:   Physical Exam BP 130/84 (BP Location: Right Arm, Patient Position: Sitting, Cuff Size: Normal)   Pulse 74   Ht 5\' 9"  (1.753 m)   Wt 219 lb (99.3 kg)   SpO2 99%   BMI 32.34 kg/m     Wt Readings from Last 3 Encounters:  11/27/22 219 lb (99.3 kg)  08/07/22 220 lb (99.8 kg)  08/02/22 220 lb (99.8 kg)   Constitutional: overweight, in NAD Eyes: no exophthalmos ENT: no masses palpated in neck, no cervical lymphadenopathy Cardiovascular: RRR, No MRG Respiratory: CTA B Musculoskeletal: no deformities Skin: no rashes Neurological: no tremor with outstretched hands Diabetic Foot Exam - Simple   Simple Foot Form Diabetic Foot exam was performed with the following findings: Yes 11/27/2022  9:59 AM  Visual Inspection No deformities, no ulcerations, no other skin breakdown bilaterally: Yes Sensation Testing Intact to touch and monofilament testing bilaterally: Yes Pulse Check Posterior Tibialis and Dorsalis pulse intact bilaterally: Yes Comments    Assessment:     1. 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 HL  3. Hypothyroidism    Plan:     1.  Patient with longstanding, uncontrolled, type 2 diabetes, on a complex medication regimen including metformin, SGLT2 inhibitor and also basal/bolus insulin regimen and weekly GLP-1/GIP receptor agonist with finally improved control on the higher dose  of Mounjaro.  Last HbA1c was 6.8% at last visit, the best he had in many years.  At that time, sugars were fluctuating in the upper half of the target range with only occasional values above 180.  Upon questioning, he decreased the dose of metformin due to diarrhea and was only taking it once a day, at the beginning of his day.  I advised him to lose weight with the last meal of his diet.  I advised him that if the sugars remain elevated, he could increase the dose of Lantus. CGM interpretation: -At today's visit, we reviewed his CGM downloads: It appears that 71% of values are in target range (goal >70%), while 29% are higher than 180 (goal <25%), and 0% are lower than 70 (goal <4%).  The calculated average blood sugar is 161.  The projected HbA1c for the next 3 months (GMI) is 7.2%. -Reviewing the CGM trends, sugars are fluctuating in the upper normal range with higher blood sugars after 6 AM and then again late afternoon, around dinnertime. -Patient mentions that he feels  his sugars are dropping when he is more active at work if he takes the entire dose of Lantus so he is sometimes skipping it without necessarily worsening of his blood sugars.  However, he also describes that his sugars are higher in the morning even without eating many times show we discussed about not skipping the Lantus but may be reducing the dose whenever he is more active during the night (at work).  Also, we discussed about possibly reducing the Humalog with dinner to avoid lower blood sugars overnight.  Otherwise, we will continue the current regimen.  We did discuss about increasing hydration and also exercise to improve his insulin resistance.  Unfortunately, he still needs to get steroid injections, which I suspect are impacting his insulin sensitivity in a negative way. - I advised him to: Patient Instructions  Please continue: - Metformin 1000 mg with the last meal of your day - Jardiance 25 mg before breakfast - Mounjaro  15 mg weekly  Use: - Humalog (15 min before each meal) 25-30 units for breakfast but 20-25 units before the rest of the meals - Lantus 30-40 units 2x a day (use the lower dose before your shift)  Please return in 4 months.  - we checked his HbA1c: 7.3% (higher) - advised to check sugars at different times of the day - 4x a day, rotating check times - advised for yearly eye exams >> he is UTD - return to clinic in 4 months  2. HL  -Reviewed latest lipid panel from 10/2021: LDL and HDL at goal, triglycerides high: Lab Results  Component Value Date   CHOL 133 10/27/2021   HDL 45.50 10/27/2021   LDLCALC 37 10/20/2019   LDLDIRECT 63.0 10/27/2021   TRIG 210.0 (H) 10/27/2021   CHOLHDL 3 10/27/2021  -He continues on Lipitor 40 mg daily without side effects  3. Hypothyroidism - latest thyroid labs reviewed with pt. >> normal: Lab Results  Component Value Date   TSH 4.19 10/27/2021  - he continues on LT4 75 mcg daily - pt feels good on this dose. - we discussed about taking the thyroid hormone every day, with water, >30 minutes before breakfast, separated by >4 hours from acid reflux medications, calcium, iron, multivitamins. Pt. is taking it correctly. - will check thyroid tests at next visit, if not checked that the annual physical exam with PCP.  Carlus Pavlov, MD PhD [facility]

## 2022-11-27 NOTE — Patient Instructions (Addendum)
Please continue: - Metformin 1000 mg with the last meal of your day - Jardiance 25 mg before breakfast - Mounjaro 15 mg weekly  Use: - Humalog (15 min before each meal) 25-30 units for breakfast but 20-25 units before the rest of the meals - Lantus 30-40 units 2x a day (use the lower dose before your shift)  Please return in 4 months.

## 2022-12-06 LAB — HM DIABETES EYE EXAM

## 2022-12-07 ENCOUNTER — Encounter: Payer: Self-pay | Admitting: Internal Medicine

## 2022-12-12 ENCOUNTER — Encounter: Payer: Self-pay | Admitting: Primary Care

## 2022-12-12 ENCOUNTER — Ambulatory Visit: Payer: 59 | Admitting: Primary Care

## 2022-12-12 VITALS — BP 134/68 | HR 61 | Temp 98.3°F | Ht 69.0 in | Wt 216.0 lb

## 2022-12-12 DIAGNOSIS — G4733 Obstructive sleep apnea (adult) (pediatric): Secondary | ICD-10-CM

## 2022-12-12 NOTE — Patient Instructions (Addendum)
Good compliance with CPAP Apnea is well controlled on current pressure settings  Orders: Renew CPAP supplies with [employer]  Follow-up: 1 year with Beth NP or sooner if needed

## 2022-12-12 NOTE — Progress Notes (Signed)
@Patient  ID: Mitchell Palmer, male    DOB: 1965/09/08, 57 y.o.   MRN: [id number]  Chief Complaint  Patient presents with   Follow-up    CPAP compliance    Referring provider: Tower, Audrie Gallus, MD  HPI: 57 year-old male, never smoked.  Past medical history significant for hypertension, allergic rhinitis, GERD, type 2 diabetes, obesity.  Previous [facility] pulmonary encounter:  12/29/2021 Patient presents today for sleep consult. He was referred by Dr. Fawn Kirk for possible sleep apnea d/t diabetic retinopathy He has symptoms of restless sleep, loud snoring, wakes up gasping for air and daytime fatigue. 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 Typical bedtime is either 7:30am or 9pm. It can take him 30-60 mins to fall asleep. He wakes up 3-4 times a night. He starts his day at either 4:30pm and 9am. No previous sleep study. He is not on CPAP or oxygen. Epworh 2. Denies narcolepsy, cataplexy or sleep walking.   Sleep questionnaire Symptoms-   Restless sleep, loud snoring, wakes up gasping for air Prior sleep study- None  Bedtime- 7:20am or 9pm  Time to fall asleep- 30-60 mins  Nocturnal awakenings- 3-4 times  Out of bed/start of day- 4:30pm or 9am Weight changes- fluctuates 5-10lbs  Do you operate heavy machinery- Yes  Do you currently wear CPAP- No Do you current wear oxygen- No Epworth- 2  04/12/2022  Patient is here today to review sleep study results.  Patient has symptoms of restless sleep, loud snoring and waking up gasping for air.  HST on 03/15/22 showed moderate OSA, AHI 16.6/hr with SpO2 low 48% (baseline 89%).  Due to severity of sleep apnea and oxygen desaturation recommending patient be started on CPAP.  Patient is in agreement with plan.  06/12/2022 Patient presents today for 2 to 47-month follow-up OSA.  Patient originally presented back in May for sleep consult due to symptoms of restless sleep, loud snoring and waking up gasping for air.  Home  sleep study on 03/15/2022 showed moderate obstructive sleep apnea, AHI 16.6 an hour.  Due to moderate severity of his sleep apnea and clinical symptoms along with severe oxygen desaturation patient was started on auto CPAP 5 to 15 cm H2O with mask of choice.  He originally had some issues with mask. He has nasal congestion at night, uses flonase nasal spray. More than half the time he feels that he is sleeping better. Snoring has improved. Needs ONO on CPAP.   ICodeConnect 05/01/22-05/22/22 21/22 days used (95%) Average usage 7 hours 28 mins Pressure 5-15cm h20 (9.8cm h20-95%) Airleaks 4.4L/min AHI 4.9    12/12/2022 Patient presents today for OSA follow-up. Home sleep study 03/15/22 showed moderate obstructive sleep apnea, AHI 16.6/hour with severe oxygen desaturations. He was started on CPAP in fall 2023. He has a luna machine. He was doing well on CPAP until allergy season. He has been having some nasal congestion.  Most of the time he feels he sleeps better since being started on PAP therapy. Snoring has improved. He is using full face mask.   ICodeConnect 11/09/22-12/08/22 Usage  29/30 days (96%) Average usage 5 hours 1 min Pressure 5-15cm h20 (9.2cm h20-95%) Airleaks 25L/min (95%) AHI 1.1   Allergies  Allergen Reactions   Codeine     Anxious   Lipitor [Atorvastatin]     Muscle pain     Immunization History  Administered Date(s) Administered   Hep A / Hep B 03/26/2018, 04/28/2018, 09/30/2018   Influenza  Split 06/17/2012   Influenza Whole 06/20/2010   Influenza,inj,Quad PF,6+ Mos 06/03/2013, 06/30/2014, 07/22/2015, 05/15/2016, 05/10/2017, 04/28/2018, 05/28/2019, 05/31/2020, 05/24/2021, 05/15/2022   PFIZER(Purple Top)SARS-COV-2 Vaccination 03/16/2020, 04/08/2020   Pneumococcal Polysaccharide-23 10/12/2011, 02/27/2017   Td 08/21/1999, 09/14/2020   Tdap 01/12/2011   Zoster Recombinat (Shingrix) 05/23/2022    Past Medical History:  Diagnosis Date   Allergy    allegic rhinitis    Diabetes mellitus    type II   Esophageal stricture    GERD (gastroesophageal reflux disease)    History of hernia repair    as a baby   Hyperlipidemia    Hyperplastic polyp of intestine 2010   Hypertension    OSA (obstructive sleep apnea) 04/18/2022   Proliferative diabetic retinopathy of left eye 03/31/2020   This is active as a patient still has tufts of the anterior segment neovascularization, rubeosis in the left eye every region of capillary nonperfusion should be treated with PRP.   S/P ear surgery, follow-up exam    for drainage   Vitreous hemorrhage of right eye 12/24/2019    Tobacco History: Social History   Tobacco Use  Smoking Status Never  Smokeless Tobacco Never   Counseling given: Not Answered   Outpatient Medications Prior to Visit  Medication Sig Dispense Refill   cetirizine (ZYRTEC) 10 MG tablet Take 10 mg by mouth daily.     Cholecalciferol (VITAMIN D) 50 MCG (2000 UT) tablet Take 2,000 Units by mouth daily.     Continuous Blood Gluc Sensor (FREESTYLE LIBRE 3 SENSOR) MISC 1 applicator by Does not apply route every 14 (fourteen) days. 6 each 3   cyclobenzaprine (FLEXERIL) 10 MG tablet Take 1 tablet (10 mg total) by mouth 3 (three) times daily as needed. 30 tablet 3   empagliflozin (JARDIANCE) 25 MG TABS tablet TAKE 1 TABLET BY MOUTH EVERY DAY BEFORE BREAKFAST 90 tablet 3   fluticasone (FLONASE) 50 MCG/ACT nasal spray PLACE 2 SPRAYS INTO BOTH NOSTRILS DAILY AS NEEDED FOR ALLERGIES OR RHINITIS. 48 mL 0   insulin glargine (LANTUS SOLOSTAR) 100 UNIT/ML Solostar Pen INJECT 42 UNITS IN THE MORNING AND 50 UNITS IN THE EVENING 60 mL 3   insulin lispro (HUMALOG KWIKPEN) 100 UNIT/ML KwikPen INJECT 15-25 UNITS INTO THE SKIN 3 TIMES DAILY W/ MEALS AND 10-12 UNITS INTO THE SKIN W/ SNACKS 60 mL 3   Insulin Pen Needle (B-D UF III MINI PEN NEEDLES) 31G X 5 MM MISC USE AS DIRECTED 4 TIMES A DAY 100 each 3   levothyroxine (SYNTHROID) 75 MCG tablet Take 1 tablet (75 mcg total) by  mouth daily before breakfast. 90 tablet 3   metFORMIN (GLUCOPHAGE) 1000 MG tablet Take 1 tablet (1,000 mg total) by mouth 2 (two) times daily with a meal. (Patient taking differently: Take 1,000 mg by mouth daily.) 90 tablet 3   methocarbamol (ROBAXIN) 500 MG tablet Take 500-1,000 mg by mouth every 6 (six) hours as needed.     olmesartan (BENICAR) 40 MG tablet TAKE 1 TABLET BY MOUTH EVERY DAY 90 tablet 1   omeprazole (PRILOSEC) 40 MG capsule TAKE 1 CAPSULE BY MOUTH EVERY DAY 90 capsule 1   ONETOUCH ULTRA test strip USE TO TEST BLOOD SUGAR 4 TIMES DAILY AS DIRECTED 400 strip 11   rosuvastatin (CRESTOR) 10 MG tablet TAKE 1 TABLET BY MOUTH EVERY DAY 30 tablet 11   tadalafil (CIALIS) 20 MG tablet Take 1 tablet (20 mg total) by mouth daily as needed for erectile dysfunction. 4 tablet 5  tirzepatide (MOUNJARO) 12.5 MG/0.5ML Pen Inject 12.5 mg into the skin once a week. 2 mL 5   tirzepatide (MOUNJARO) 15 MG/0.5ML Pen Inject 15 mg into the skin once a week. 2 mL 5   vitamin B-12 (CYANOCOBALAMIN) 500 MCG tablet Take 500 mcg by mouth daily.     No facility-administered medications prior to visit.    Review of Systems  Review of Systems  Constitutional: Negative.   HENT: Negative.    Respiratory: Negative.    Cardiovascular: Negative.    Physical Exam  BP 134/68 (BP Location: Right Arm, Patient Position: Sitting, Cuff Size: Normal)   Pulse 61   Temp 98.3 F (36.8 C) (Oral)   Ht 5\' 9"  (1.753 m)   Wt 216 lb (98 kg)   SpO2 99%   BMI 31.90 kg/m  Physical Exam Constitutional:      General: He is not in acute distress.    Appearance: Normal appearance. He is not ill-appearing.  HENT:     Head: Normocephalic and atraumatic.  Cardiovascular:     Rate and Rhythm: Normal rate and regular rhythm.  Pulmonary:     Effort: Pulmonary effort is normal.     Breath sounds: Normal breath sounds. No wheezing, rhonchi or rales.  Neurological:     General: No focal deficit present.     Mental Status:  He is alert and oriented to person, place, and time. Mental status is at baseline.  Psychiatric:        Mood and Affect: Mood normal.        Behavior: Behavior normal.        Thought Content: Thought content normal.        Judgment: Judgment normal.      Lab Results:  CBC    Component Value Date/Time   WBC 7.4 07/23/2022 1717   RBC 4.99 07/23/2022 1717   HGB 17.2 (H) 07/23/2022 1717   HCT 47.5 07/23/2022 1717   PLT 215 07/23/2022 1717   MCV 95.2 07/23/2022 1717   MCH 34.5 (H) 07/23/2022 1717   MCHC 36.2 (H) 07/23/2022 1717   RDW 12.1 07/23/2022 1717   LYMPHSABS 1.5 07/23/2022 1717   MONOABS 0.7 07/23/2022 1717   EOSABS 0.2 07/23/2022 1717   BASOSABS 0.1 07/23/2022 1717    BMET    Component Value Date/Time   NA 139 07/23/2022 1717   K 4.3 07/23/2022 1717   CL 103 07/23/2022 1717   CO2 27 07/23/2022 1717   GLUCOSE 158 (H) 07/23/2022 1717   BUN 13 07/23/2022 1717   CREATININE 1.05 07/23/2022 1717   CALCIUM 9.3 07/23/2022 1717   GFRNONAA >60 07/23/2022 1717   GFRAA >90 09/23/2012 1200    BNP No results found for: "BNP"  ProBNP No results found for: "PROBNP"  Imaging: MR LUMBAR SPINE WO CONTRAST  Result Date: 11/15/2022 CLINICAL DATA:  Chronic low back pain for 20 years EXAM: MRI LUMBAR SPINE WITHOUT CONTRAST TECHNIQUE: Multiplanar, multisequence MR imaging of the lumbar spine was performed. No intravenous contrast was administered. COMPARISON:  05/25/2018 FINDINGS: Segmentation: In keeping with the prior exam, there are 5 lumbar type vertebral bodies. Alignment: Trace retrolisthesis of L1 on L2, L2 on L3, and L3 on L4, which have progressed slightly since the prior exam. There is now trace retrolisthesis of L4 on L5. unchanged trace anterolisthesis of L5 on S1. Dextrocurvature of the thoracolumbar junction and upper lumbar spine, with compensatory levocurvature lower lumbar spine. Vertebrae: No acute fracture or suspicious osseous lesion. Endplate  degenerative  changes, eccentric to the left L2-L3 and eccentric to the right at L4-L5. Conus medullaris and cauda equina: Conus extends to the L1 level. Possible mild clumping of the nerve roots at L4-L5 through L5-S1. Conus and cauda equina appear normal. Paraspinal and other soft tissues: Negative. Disc levels: T11-T12: Seen only on the sagittal images. Small left subarticular disc protrusion. No spinal canal stenosis or neural foraminal narrowing. T12-L1: Minimal disc bulge. No spinal canal stenosis or neural foraminal narrowing. L1-L2: Trace retrolisthesis and mild disc bulge. Mild facet arthropathy. No spinal canal stenosis or neural foraminal narrowing. L2-L3: Trace retrolisthesis with mild disc bulge and superimposed left paracentral disc protrusion. Mild facet arthropathy. Ligamentum flavum hypertrophy. Effacement of the left lateral recess and narrowing of the right lateral recess. Moderate to severe spinal canal stenosis and mild bilateral neural foraminal narrowing, unchanged. L3-L4: Trace retrolisthesis and moderate disc bulge with superimposed central disc protrusion. Effacement of the left greater than right lateral recess. Moderate facet arthropathy. Ligamentum flavum hypertrophy. Moderate to severe spinal canal stenosis and mild left greater than right neural foraminal narrowing, unchanged. L4-L5: Redemonstrated dysplastic left-sided posterior elements, with largely conjoined left L4 and L5 neural foramina, which are widely patent. Mild disc bulge with central disc protrusion. Right greater than left facet arthropathy. Narrowing of the right lateral recess. No spinal canal stenosis. Severe right neural foraminal narrowing, which has progressed from the prior exam. L5-S1: Trace anterolisthesis with disc unroofing and right paracentral disc extrusion with 5 mm cranial migration. Mild facet arthropathy. No spinal canal stenosis. Severe right neural foraminal narrowing, which has progressed from the prior exam  IMPRESSION: 1. L2-L3 and L3-L4 moderate to severe spinal canal stenosis and mild bilateral neural foraminal narrowing, unchanged. 2. L4-L5 and L5-S1 severe right neural foraminal narrowing, which has progressed from the prior exam. Narrowing of the right lateral recess at L4-L5 could affect the descending right L5 nerve roots. 3. Clumping of the nerve roots at L4-L5 through L5-S1, which can be seen in the setting of arachnoiditis. Electronically Signed   By: Wiliam Ke M.D.   On: 11/15/2022 14:26   MR CERVICAL SPINE WO CONTRAST  Result Date: 11/14/2022 CLINICAL DATA:  Initial evaluation for chronic neck pain, left upper extremity tingling with flexion. Prior surgery. EXAM: MRI CERVICAL SPINE WITHOUT CONTRAST TECHNIQUE: Multiplanar, multisequence MR imaging of the cervical spine was performed. No intravenous contrast was administered. COMPARISON:  Prior study from 05/25/2018. FINDINGS: Alignment: Moderate levoscoliosis. Trace degenerative anterolisthesis of C2 on C3. Vertebrae: Susceptibility artifact related to prior ACDF at C5-6. Arthrodesis not established by MRI. Vertebral body height maintained without acute or chronic fracture. Bone marrow signal intensity within normal limits. No discrete or worrisome osseous lesions. No abnormal marrow edema. Cord: Normal signal and morphology. Posterior Fossa, vertebral arteries, paraspinal tissues: Unremarkable. Disc levels: C2-C3: Mild disc bulge with right greater than left uncovertebral spurring. Right-sided facet hypertrophy. No significant spinal stenosis. Moderate right C3 foraminal narrowing. Left neural foramina remains patent. C3-C4: Degenerative intervertebral disc space narrowing. Broad-based right paracentral disc osteophyte complex flattens and partially effaces the ventral thecal sac. Minimal cord flattening no cord signal changes. Mild right-sided facet hypertrophy. No significant spinal stenosis. Moderate right C4 foraminal narrowing. Left neural  foramen remains patent. C4-C5: Degenerative vertebral disc space narrowing with diffuse disc bulge, asymmetric to the left. Mild bilateral facet hypertrophy. Right-sided facet degeneration. No spinal stenosis. Foramina remain patent. C5-C6: Prior fusion. No residual spinal stenosis. Uncovertebral spurring with residual moderate left C6 foraminal stenosis.  Right neural foramen remains patent. C6-C7: Minimal uncovertebral spurring without significant disc bulge. No spinal stenosis. Foramina remain patent. C7-T1: Normal interspace. Minimal facet degeneration. No canal or foraminal stenosis. IMPRESSION: 1. Prior ACDF at C5-6 without residual spinal stenosis. Uncovertebral spurring with residual moderate left C6 foraminal narrowing. 2. Right paracentral disc osteophyte complex at C3-4 with resultant moderate right C4 foraminal stenosis. 3. Right-sided uncovertebral and facet hypertrophy at C2-3 and C3-4 with resultant moderate right C3 and C4 foraminal stenosis. 4. Underlying moderate levoscoliosis. Electronically Signed   By: Rise Mu M.D.   On: 11/14/2022 16:25     Assessment & Plan:   OSA (obstructive sleep apnea) - Patient has good compliance with CPAP. Apneas are well controlled on current pressure settings 5-15cm h20. Renew CPAP supplies with [employer]. Encourage patient continue to wear CPAP nightly for 4 to 6 hours.  FU in 1 year with [facility] NP or sooner if needed.    Glenford Bayley, NP 12/12/2022

## 2022-12-12 NOTE — Assessment & Plan Note (Signed)
-   Patient has good compliance with CPAP. Apneas are well controlled on current pressure settings 5-15cm h20. Renew CPAP supplies with [employer]. Encourage patient continue to wear CPAP nightly for 4 to 6 hours.  FU in 1 year with [facility] NP or sooner if needed.

## 2022-12-13 NOTE — Progress Notes (Signed)
Reviewed and agree with assessment/plan.   Coralyn Helling, MD [facility] Pulmonary/Critical Care 12/13/2022, 8:21 AM Pager:  [phone]

## 2022-12-24 LAB — HM DIABETES EYE EXAM

## 2023-01-21 ENCOUNTER — Other Ambulatory Visit: Payer: Self-pay | Admitting: Family Medicine

## 2023-01-29 ENCOUNTER — Other Ambulatory Visit: Payer: Self-pay | Admitting: Internal Medicine

## 2023-02-08 ENCOUNTER — Ambulatory Visit: Payer: 59 | Admitting: Family Medicine

## 2023-02-08 ENCOUNTER — Encounter: Payer: Self-pay | Admitting: Family Medicine

## 2023-02-08 VITALS — BP 126/62 | HR 82 | Temp 97.7°F | Ht 69.0 in | Wt 208.0 lb

## 2023-02-08 DIAGNOSIS — M79672 Pain in left foot: Secondary | ICD-10-CM | POA: Diagnosis not present

## 2023-02-08 DIAGNOSIS — G6289 Other specified polyneuropathies: Secondary | ICD-10-CM

## 2023-02-08 DIAGNOSIS — I10 Essential (primary) hypertension: Secondary | ICD-10-CM

## 2023-02-08 DIAGNOSIS — M79671 Pain in right foot: Secondary | ICD-10-CM | POA: Diagnosis not present

## 2023-02-08 DIAGNOSIS — G629 Polyneuropathy, unspecified: Secondary | ICD-10-CM | POA: Insufficient documentation

## 2023-02-08 MED ORDER — GABAPENTIN 100 MG PO CAPS: 100.0000 mg | ORAL_CAPSULE | Freq: Two times a day (BID) | ORAL | 1 refills | Status: DC

## 2023-02-08 NOTE — Patient Instructions (Addendum)
Use a cold compress/ ice on the right heel and calf area when you can  Get voltaren gel 1% over the counter - as needed up to four times daily   I think you also have diabetic neuropathy   Try gabapentin 100 mg  Start with 100 mg at bedtime  If well tolerated after a week increase to 100 mg am and pm   If any intolerable side effects stop it and let us know      I will place a referral to podiatry Please let us know if you don't hear in 1-2 weeks    I will order labs for thyroid /cholesterol / chem - stop at check out to schedule fasting labs

## 2023-02-08 NOTE — Assessment & Plan Note (Signed)
bp in fair control at this time  BP Readings from Last 1 Encounters:  02/08/23 126/62   No changes needed Most recent labs reviewed  Disc lifstyle change with low sodium diet and exercise   Plan to continue benicar 40 mg daily  Pt is interested in labs but has eaten today Will schedule a fasting draw

## 2023-02-08 NOTE — Progress Notes (Unsigned)
Subjective:    Patient ID: Mitchell Palmer, male    DOB: Nov 05, 1965, 57 y.o.   MRN: [id number]  HPI  Wt Readings from Last 3 Encounters:  02/08/23 208 lb (94.3 kg)  12/12/22 216 lb (98 kg)  11/27/22 219 lb (99.3 kg)   30.72 kg/m  Vitals:   02/08/23 1158  BP: 126/62  Pulse: 82  Temp: 97.7 F (36.5 C)  SpO2: 98%   Pt presents for right heel pain  Left foot pain  ? Neuropathy  Also mt labs   Tingling in feet    Right heel area pain - / on /off since knee surgery last dec Worse since january Comes up into the achilles tendon  Some in the forefoot also - on and off  No swelling or redness No trauma  No new activity   Left foot is numb feeling  Years ago tore some tendons also   Wearing inserts in shoes  High arches   No over the counter meds for pain right now  Has occational in the past -tylenol     DM2  Lab Results  Component Value Date   HGBA1C 7.3 (A) 11/27/2022   Lab Results  Component Value Date   VITAMINB12 274 10/27/2021   Taking B12 500 mcg  Also taking D3      Patient Active Problem List   Diagnosis Date Noted   Peripheral neuropathy 02/08/2023   Heel pain, bilateral 02/08/2023   Dyspnea on exertion 07/30/2022   OSA (obstructive sleep apnea) 04/18/2022   Loud snoring 12/12/2021   Family history of colon cancer    Lump on finger 11/06/2021   Pseudophakia, left eye 10/26/2021   Pseudophakia, right eye 10/26/2021   Vitreomacular adhesion of left eye 09/21/2021   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/2021   Vitreous hemorrhage, left eye (HCC) 06/07/2021   Proliferative diabetic retinopathy of left eye with macular edema associated with type 2 diabetes mellitus (HCC) 06/07/2021   Localized swelling, mass and lump, upper limb 05/31/2021   Low vitamin D level 09/14/2020   Prostate cancer screening 09/02/2020   Current use of proton pump inhibitor 09/02/2020   Family history of factor V Leiden mutation 05/30/2020   Left ankle pain  01/29/2020   Diabetic macular edema of right eye with proliferative retinopathy associated with type 2 diabetes mellitus (HCC) 12/24/2019   Proliferative diabetic retinopathy of left eye determined by examination (HCC) 12/24/2019   Nuclear sclerotic cataract of left eye 12/24/2019   Low testosterone 11/13/2019   Fatigue 10/20/2019   Neck pain 04/28/2018   Need for hepatitis B screening test 03/26/2018   Need for hepatitis C screening test 03/26/2018   Paresthesia of both feet 11/01/2017   Hemorrhoids 10/14/2017   Class 2 obesity due to excess calories with body mass index (BMI) of 35.0 to 35.9 in adult 01/24/2016   Hypothyroidism 01/24/2016   Type 2 diabetes mellitus with ophthalmic complication (HCC) 07/28/2015   Alcohol consumption heavy 12/06/2014   Routine general medical examination at a health care facility 09/30/2013   Low back pain 06/17/2012   PLANTAR FASCIITIS, TRAUMATIC 11/09/2009   NEOPLASM UNCERTAIN BEHAVIOR OTHER SPEC SITES 10/07/2009   ESOPHAGEAL STRICTURE 06/27/2009   GERD 03/31/2009   Hyperlipidemia associated with type 2 diabetes mellitus (HCC) 01/06/2009   Essential hypertension 01/06/2009   Allergic rhinitis 01/06/2009   Past Medical History:  Diagnosis Date   Allergy    allegic rhinitis   Diabetes mellitus    type II  Esophageal stricture    GERD (gastroesophageal reflux disease)    History of hernia repair    as a baby   Hyperlipidemia    Hyperplastic polyp of intestine 2010   Hypertension    OSA (obstructive sleep apnea) 04/18/2022   Proliferative diabetic retinopathy of left eye (HCC) 03/31/2020   This is active as a patient still has tufts of the anterior segment neovascularization, rubeosis in the left eye every region of capillary nonperfusion should be treated with PRP.   S/P ear surgery, follow-up exam    for drainage   Vitreous hemorrhage of right eye (HCC) 12/24/2019   Past Surgical History:  Procedure Laterality Date   BIOPSY  12/07/2021    Procedure: BIOPSY;  Surgeon: Meryl Dare, MD;  Location: [facility];  Service: Gastroenterology;;   CATARACT EXTRACTION     COLONOSCOPY  03/07/2016   COLONOSCOPY WITH PROPOFOL N/A 12/07/2021   Procedure: COLONOSCOPY WITH PROPOFOL;  Surgeon: Meryl Dare, MD;  Location: [facility];  Service: Gastroenterology;  Laterality: N/A;   ESOPHAGOGASTRODUODENOSCOPY  04/20/2009   stricture/GERD   ESOPHAGOGASTRODUODENOSCOPY (EGD) WITH PROPOFOL N/A 12/07/2021   Procedure: ESOPHAGOGASTRODUODENOSCOPY (EGD) WITH PROPOFOL;  Surgeon: Meryl Dare, MD;  Location: [facility];  Service: Gastroenterology;  Laterality: N/A;   EYE SURGERY     HERNIA REPAIR     age 62-midline   INNER EAR SURGERY  08/20/1986   went in behind rt ear-blockage   NECK SURGERY     [facility]   POLYPECTOMY     SAVORY DILATION N/A 12/07/2021   Procedure: SAVORY DILATION;  Surgeon: Meryl Dare, MD;  Location: [facility];  Service: Gastroenterology;  Laterality: N/A;   SHOULDER ARTHROSCOPY WITH ROTATOR CUFF REPAIR AND SUBACROMIAL DECOMPRESSION Right 09/29/2012   Procedure: SHOULDER ARTHROSCOPY WITH ROTATOR CUFF REPAIR AND SUBACROMIAL DECOMPRESSION;  Surgeon: Mable Paris, MD;  Location: [facility];  Service: Orthopedics;  Laterality: Right;  Right shoulder arthroscopy with subacromial decompression and distal clavicle excision & Debridement of Labrial Tear   SHOULDER SURGERY Left    x2   UPPER GASTROINTESTINAL ENDOSCOPY     Social History   Tobacco Use   Smoking status: Never   Smokeless tobacco: Never  Vaping Use   Vaping Use: Never used  Substance Use Topics   Alcohol use: Yes    Alcohol/week: 12.0 standard drinks of alcohol    Types: 12 Cans of beer per week   Drug use: No   Family History  Problem Relation Age of Onset   Diabetes Mother    Colon cancer Father 31   Diabetes Other    Colon polyps Brother    Diabetes Maternal Aunt    Cancer Cousin        breast cancer    Allergies  Allergen Reactions   Codeine     Anxious   Lipitor [Atorvastatin]     Muscle pain    Current Outpatient Medications on File Prior to Visit  Medication Sig Dispense Refill   B-D UF III MINI PEN NEEDLES 31G X 5 MM MISC USE AS DIRECTED 4 TIMES A DAY 100 each 3   cetirizine (ZYRTEC) 10 MG tablet Take 10 mg by mouth daily.     Cholecalciferol (VITAMIN D) 50 MCG (2000 UT) tablet Take 2,000 Units by mouth daily.     Continuous Blood Gluc Sensor (FREESTYLE LIBRE 3 SENSOR) MISC 1 applicator by Does not apply route every 14 (fourteen) days. 6 each 3   cyclobenzaprine (  FLEXERIL) 10 MG tablet Take 1 tablet (10 mg total) by mouth 3 (three) times daily as needed. 30 tablet 3   empagliflozin (JARDIANCE) 25 MG TABS tablet TAKE 1 TABLET BY MOUTH EVERY DAY BEFORE BREAKFAST 90 tablet 3   fluticasone (FLONASE) 50 MCG/ACT nasal spray PLACE 2 SPRAYS INTO BOTH NOSTRILS DAILY AS NEEDED FOR ALLERGIES OR RHINITIS. 16 mL 2   insulin glargine (LANTUS SOLOSTAR) 100 UNIT/ML Solostar Pen INJECT 42 UNITS IN THE MORNING AND 50 UNITS IN THE EVENING 60 mL 3   insulin lispro (HUMALOG KWIKPEN) 100 UNIT/ML KwikPen INJECT 15-25 UNITS INTO THE SKIN 3 TIMES DAILY W/ MEALS AND 10-12 UNITS INTO THE SKIN W/ SNACKS 60 mL 3   levothyroxine (SYNTHROID) 75 MCG tablet Take 1 tablet (75 mcg total) by mouth daily before breakfast. 90 tablet 3   metFORMIN (GLUCOPHAGE) 1000 MG tablet Take 1 tablet (1,000 mg total) by mouth 2 (two) times daily with a meal. (Patient taking differently: Take 1,000 mg by mouth daily.) 90 tablet 3   methocarbamol (ROBAXIN) 500 MG tablet Take 500-1,000 mg by mouth every 6 (six) hours as needed.     olmesartan (BENICAR) 40 MG tablet TAKE 1 TABLET BY MOUTH EVERY DAY 90 tablet 1   omeprazole (PRILOSEC) 40 MG capsule TAKE 1 CAPSULE BY MOUTH EVERY DAY 90 capsule 1   ONETOUCH ULTRA test strip USE TO TEST BLOOD SUGAR 4 TIMES DAILY AS DIRECTED 400 strip 11   rosuvastatin (CRESTOR) 10 MG tablet TAKE 1 TABLET BY  MOUTH EVERY DAY 30 tablet 11   tadalafil (CIALIS) 20 MG tablet Take 1 tablet (20 mg total) by mouth daily as needed for erectile dysfunction. 4 tablet 5   tirzepatide (MOUNJARO) 12.5 MG/0.5ML Pen Inject 12.5 mg into the skin once a week. 2 mL 5   tirzepatide (MOUNJARO) 15 MG/0.5ML Pen Inject 15 mg into the skin once a week. 2 mL 5   vitamin B-12 (CYANOCOBALAMIN) 500 MCG tablet Take 500 mcg by mouth daily.     No current facility-administered medications on file prior to visit.    Review of Systems  Constitutional:  Negative for activity change, appetite change, fatigue, fever and unexpected weight change.  HENT:  Negative for congestion, rhinorrhea, sore throat and trouble swallowing.   Eyes:  Negative for pain, redness, itching and visual disturbance.  Respiratory:  Negative for cough, chest tightness, shortness of breath and wheezing.   Cardiovascular:  Negative for chest pain, palpitations and leg swelling.  Gastrointestinal:  Negative for abdominal pain, blood in stool, constipation, diarrhea and nausea.  Endocrine: Negative for cold intolerance, heat intolerance, polydipsia and polyuria.  Genitourinary:  Negative for difficulty urinating, dysuria, frequency and urgency.  Musculoskeletal:  Positive for arthralgias. Negative for joint swelling and myalgias.       Foot pain   Skin:  Negative for pallor and rash.  Neurological:  Positive for numbness. Negative for dizziness, tremors, weakness, light-headedness and headaches.  Hematological:  Negative for adenopathy. Does not bruise/bleed easily.  Psychiatric/Behavioral:  Negative for decreased concentration and dysphoric mood. The patient is not nervous/anxious.        Objective:   Physical Exam Constitutional:      General: He is not in acute distress.    Appearance: Normal appearance. He is obese. He is not ill-appearing or diaphoretic.  Eyes:     General: No scleral icterus.    Conjunctiva/sclera: Conjunctivae normal.      Pupils: Pupils are equal, round, and reactive to light.  Cardiovascular:     Rate and Rhythm: Normal rate and regular rhythm.  Pulmonary:     Effort: Pulmonary effort is normal. No respiratory distress.  Musculoskeletal:     Right lower leg: No edema.     Left lower leg: No edema.     Comments: Right foot- tender under 1,2 MT heads  Also over   Neurological:     Mental Status: He is alert.     Cranial Nerves: No cranial nerve deficit.     Sensory: Sensory deficit present.     Motor: No weakness.     Deep Tendon Reflexes: Reflexes normal.     Comments: Some loss of sensation to light touch on bottom of feet   Psychiatric:        Mood and Affect: Mood normal.           Assessment & Plan:   Problem List Items Addressed This Visit       Cardiovascular and Mediastinum   Essential hypertension    bp in fair control at this time  BP Readings from Last 1 Encounters:  02/08/23 126/62  No changes needed Most recent labs reviewed  Disc lifstyle change with low sodium diet and exercise   Plan to continue benicar 40 mg daily  Pt is interested in labs but has eaten today Will schedule a fasting draw         Nervous and Auditory   Peripheral neuropathy    Burning and tingling in feet  Suspect dm neuropathy  Discussed therapeutic opt and will try gabapentin 100 mg at bedtime for 1 wk then increase go bid if well tolerated (reviewed side effects and handout given)   Encouraged to keep working on glucose control, doing better lately      Relevant Medications   gabapentin (NEURONTIN) 100 MG capsule   Other Relevant Orders   Ambulatory referral to Podiatry     Other   Heel pain, bilateral - Primary    On right- radiates into achilles On left more focal to heel  High arches Past tendon inj to left foot   Recommend ice  Supportive shoes with arch support  Voltaren gel prn  Ref made to podiatry      Relevant Orders   Ambulatory referral to Podiatry

## 2023-02-08 NOTE — Assessment & Plan Note (Signed)
On right- radiates into achilles On left more focal to heel  High arches Past tendon inj to left foot   Recommend ice  Supportive shoes with arch support  Voltaren gel prn  Ref made to podiatry

## 2023-02-08 NOTE — Assessment & Plan Note (Signed)
Burning and tingling in feet  Suspect dm neuropathy  Discussed therapeutic opt and will try gabapentin 100 mg at bedtime for 1 wk then increase go bid if well tolerated (reviewed side effects and handout given)   Encouraged to keep working on glucose control, doing better lately

## 2023-02-11 ENCOUNTER — Other Ambulatory Visit (INDEPENDENT_AMBULATORY_CARE_PROVIDER_SITE_OTHER): Payer: 59

## 2023-02-11 DIAGNOSIS — I10 Essential (primary) hypertension: Secondary | ICD-10-CM | POA: Diagnosis not present

## 2023-02-11 LAB — COMPREHENSIVE METABOLIC PANEL
ALT: 16 U/L (ref 0–53)
AST: 18 U/L (ref 0–37)
Albumin: 4 g/dL (ref 3.5–5.2)
Alkaline Phosphatase: 85 U/L (ref 39–117)
BUN: 17 mg/dL (ref 6–23)
CO2: 29 mEq/L (ref 19–32)
Calcium: 9.4 mg/dL (ref 8.4–10.5)
Chloride: 103 mEq/L (ref 96–112)
Creatinine, Ser: 1.42 mg/dL (ref 0.40–1.50)
GFR: 55.46 mL/min — ABNORMAL LOW (ref >60.00)
Glucose, Bld: 196 mg/dL — ABNORMAL HIGH (ref 70–99)
Potassium: 4.8 mEq/L (ref 3.5–5.1)
Sodium: 138 mEq/L (ref 135–145)
Total Bilirubin: 0.5 mg/dL (ref 0.2–1.2)
Total Protein: 6.2 g/dL (ref 6.0–8.3)

## 2023-02-11 LAB — CBC WITH DIFFERENTIAL/PLATELET
Basophils Absolute: 0.1 10*3/uL (ref 0.0–0.1)
Basophils Relative: 1.1 % (ref 0.0–3.0)
Eosinophils Absolute: 0.4 10*3/uL (ref 0.0–0.7)
Eosinophils Relative: 6.2 % — ABNORMAL HIGH (ref 0.0–5.0)
HCT: 47.8 % (ref 39.0–52.0)
Hemoglobin: 16.1 g/dL (ref 13.0–17.0)
Lymphocytes Relative: 29.9 % (ref 12.0–46.0)
Lymphs Abs: 1.9 10*3/uL (ref 0.7–4.0)
MCHC: 33.7 g/dL (ref 30.0–36.0)
MCV: 99.5 fl (ref 78.0–100.0)
Monocytes Absolute: 0.7 10*3/uL (ref 0.1–1.0)
Monocytes Relative: 11 % (ref 3.0–12.0)
Neutro Abs: 3.2 10*3/uL (ref 1.4–7.7)
Neutrophils Relative %: 51.8 % (ref 43.0–77.0)
Platelets: 213 10*3/uL (ref 150.0–400.0)
RBC: 4.81 Mil/uL (ref 4.22–5.81)
RDW: 12.9 % (ref 11.5–15.5)
WBC: 6.3 10*3/uL (ref 4.0–10.5)

## 2023-02-11 LAB — LIPID PANEL
Cholesterol: 142 mg/dL (ref 0–200)
HDL: 47.3 mg/dL (ref >39.00)
LDL Cholesterol: 68 mg/dL (ref 0–99)
NonHDL: 94.56
Total CHOL/HDL Ratio: 3
Triglycerides: 133 mg/dL (ref 0.0–149.0)
VLDL: 26.6 mg/dL (ref 0.0–40.0)

## 2023-02-11 LAB — TSH: TSH: 2.66 u[IU]/mL (ref 0.35–5.50)

## 2023-02-11 NOTE — Addendum Note (Signed)
Addended by: Roxy Manns A on: 02/11/2023 08:10 AM   Modules accepted: Orders

## 2023-02-11 NOTE — Addendum Note (Signed)
Addended by: Roxy Manns A on: 02/11/2023 09:38 AM   Modules accepted: Orders

## 2023-02-19 ENCOUNTER — Other Ambulatory Visit: Payer: Self-pay | Admitting: Family Medicine

## 2023-02-22 ENCOUNTER — Ambulatory Visit: Payer: 59 | Attending: Cardiology | Admitting: Cardiology

## 2023-02-22 ENCOUNTER — Encounter: Payer: Self-pay | Admitting: Cardiology

## 2023-02-22 VITALS — BP 118/70 | HR 56 | Ht 69.0 in | Wt 209.0 lb

## 2023-02-22 DIAGNOSIS — M79672 Pain in left foot: Secondary | ICD-10-CM

## 2023-02-22 DIAGNOSIS — Z794 Long term (current) use of insulin: Secondary | ICD-10-CM

## 2023-02-22 DIAGNOSIS — I10 Essential (primary) hypertension: Secondary | ICD-10-CM

## 2023-02-22 DIAGNOSIS — R42 Dizziness and giddiness: Secondary | ICD-10-CM

## 2023-02-22 DIAGNOSIS — E119 Type 2 diabetes mellitus without complications: Secondary | ICD-10-CM | POA: Diagnosis not present

## 2023-02-22 DIAGNOSIS — M79671 Pain in right foot: Secondary | ICD-10-CM | POA: Diagnosis not present

## 2023-02-22 MED ORDER — OLMESARTAN MEDOXOMIL 40 MG PO TABS: 20.0000 mg | ORAL_TABLET | Freq: Every day | ORAL | 1 refills | Status: DC

## 2023-02-22 NOTE — Progress Notes (Signed)
  Cardiology Office Note:  .   Date:  02/22/2023  ID:  Mitchell Palmer, DOB May 03, 1966, MRN [id number] PCP: Judy Pimple, MD  [facility] Providers Cardiologist:  Donato Schultz, MD    History of Present Illness: .   Mitchell Palmer is a 57 y.o. male here for follow-up dyspnea on exertion.  Previously seen in December 2023 with left arm tingling dyspnea on exertion 3 flights of stairs felt winded.  Went to the ER July 23, 2022 with normal troponin Hospital records reviewed.  Head CT unremarkable.  Dr. Luciana Axe eye injections.  Grandparent had an MI.  Carotids.  At prior mild right burning cath lower extremity.  Tingling in feet.  Also has some tingling in hands occasionally.  Light headed when bending over and raising up. ?carotids. On Benicar. Lost 20 pounds with [place]unjaro.     Diabetes Hypertension Hyperlipidemia CPAP OSA First-degree AV block 226 ms 2023  ROS: As above  Studies Reviewed: .        Nuclear stress test 08/07/2022-low risk no ischemia EF 53 Echocardiogram 09/03/2022-EF 55% grade 1 diastolic dysfunction Risk Assessment/Calculations:            Physical Exam:   VS:  BP 118/70   Pulse (!) 56   Ht 5\' 9"  (1.753 m)   Wt 209 lb (94.8 kg)   BMI 30.86 kg/m    Wt Readings from Last 3 Encounters:  02/22/23 209 lb (94.8 kg)  02/08/23 208 lb (94.3 kg)  12/12/22 216 lb (98 kg)    GEN: Well nourished, well developed in no acute distress NECK: No JVD; No carotid bruits CARDIAC: RRR, no murmurs, rubs, gallops RESPIRATORY:  Clear to auscultation without rales, wheezing or rhonchi  ABDOMEN: Soft, non-tender, non-distended EXTREMITIES:  No edema; No deformity, difficult to palpate distal pulses  ASSESSMENT AND PLAN: .    Dizziness - Could be orthostasis.  We will cut his Benicar back to 20 mg from 40.  May need to discontinue it altogether.  He will follow blood pressures at home.  20 pound weight loss may be contributing to his lower blood pressure. -I will  also check carotid Dopplers.  He was concerned because several of his family members have had carotid artery disease.  Rarely uses a cause for dizziness however.  Lower extremity burning, feet burning, calf burning - With diabetes, checking lower extremity arterial ultrasound.  Diabetes with hypertension - Excellent job with weight loss, GLP-1 receptor agonist.  20 pounds.  Hemoglobin A1c 7.3 back in April 2024.  LDL 68.  Continue with rosuvastatin 10 mg daily.       Dispo: 6 months APP  Signed, Donato Schultz, MD

## 2023-02-22 NOTE — Patient Instructions (Signed)
Medication Instructions:  Please decrease your Benicar to 20 mg (1/2 tablet) daily.  Keep a blood pressure diary for 2 weeks and send it to Dr Anne Fu through your MyChart. Continue all other medications as listed.  *If you need a refill on your cardiac medications before your next appointment, please call your pharmacy*  Testing/Procedures: Your physician has requested that you have a carotid duplex. This test is an ultrasound of the carotid arteries in your neck. It looks at blood flow through these arteries that supply the brain with blood. Allow one hour for this exam. There are no restrictions or special instructions.  Your physician has requested that you have a lower extremity arterial exercise duplex. During this test, exercise and ultrasound are used to evaluate arterial blood flow in the legs. Allow one hour for this exam. There are no restrictions or special instructions.  Both the above tests are completed at our [facility] office.   Follow-Up: At [facility], you and your health needs are our priority.  As part of our continuing mission to provide you with exceptional heart care, we have created designated Provider Care Teams.  These Care Teams include your primary Cardiologist (physician) and Advanced Practice Providers (APPs -  Physician Assistants and Nurse Practitioners) who all work together to provide you with the care you need, when you need it.  We recommend signing up for the patient portal called "MyChart".  Sign up information is provided on this After Visit Summary.  MyChart is used to connect with patients for Virtual Visits (Telemedicine).  Patients are able to view lab/test results, encounter notes, upcoming appointments, etc.  Non-urgent messages can be sent to your provider as well.   To learn more about what you can do with MyChart, go to [email or web address].    Your next appointment:   6 month(s)  Provider:   Jari Favre, PA-C, Robin Searing, NP,  Jacolyn Reedy, PA-C, Eligha Bridegroom, NP, Tereso Newcomer, PA-C, or Perlie Gold, PA-C

## 2023-02-25 ENCOUNTER — Ambulatory Visit: Payer: 59 | Admitting: Podiatry

## 2023-02-25 ENCOUNTER — Ambulatory Visit (INDEPENDENT_AMBULATORY_CARE_PROVIDER_SITE_OTHER): Payer: 59

## 2023-02-25 ENCOUNTER — Encounter: Payer: Self-pay | Admitting: Podiatry

## 2023-02-25 DIAGNOSIS — M722 Plantar fascial fibromatosis: Secondary | ICD-10-CM | POA: Diagnosis not present

## 2023-02-25 DIAGNOSIS — E1169 Type 2 diabetes mellitus with other specified complication: Secondary | ICD-10-CM

## 2023-02-25 DIAGNOSIS — E785 Hyperlipidemia, unspecified: Secondary | ICD-10-CM

## 2023-02-25 DIAGNOSIS — G6289 Other specified polyneuropathies: Secondary | ICD-10-CM

## 2023-02-25 MED ORDER — MELOXICAM 15 MG PO TABS: 15.0000 mg | ORAL_TABLET | Freq: Every day | ORAL | 0 refills | Status: DC

## 2023-02-25 NOTE — Progress Notes (Signed)
  Subjective:  Patient ID: Mitchell Palmer, male    DOB: 1966-06-04,   MRN: [id number]  Chief Complaint  Patient presents with   Numbness    Bilateral numbness, patient states he is a diabetic states most of his pain is a numbness feeling states within in right foot he feels a pulling sensation coming from his heel.     57 y.o. male presents for concern of bilateral numbness in feet. He is diabetic and has history of back pain. Relates the right heel he will feel a pulling sensation that comes from his heels which he is most concerned with today. He takes gabapentin.  Has been wearing supportive shoes and using Voltaren. Relates he does get pain in the morning with first steps.   Denies any other pedal complaints. Denies n/v/f/c.   Past Medical History:  Diagnosis Date   Allergy    allegic rhinitis   Diabetes mellitus    type II   Esophageal stricture    GERD (gastroesophageal reflux disease)    History of hernia repair    as a baby   Hyperlipidemia    Hyperplastic polyp of intestine 2010   Hypertension    OSA (obstructive sleep apnea) 04/18/2022   Proliferative diabetic retinopathy of left eye (HCC) 03/31/2020   This is active as a patient still has tufts of the anterior segment neovascularization, rubeosis in the left eye every region of capillary nonperfusion should be treated with PRP.   S/P ear surgery, follow-up exam    for drainage   Vitreous hemorrhage of right eye (HCC) 12/24/2019    Objective:  Physical Exam: Vascular: DP/PT pulses 2/4 bilateral. CFT <3 seconds. Normal hair growth on digits. No edema.  Skin. No lacerations or abrasions bilateral feet.  Musculoskeletal: MMT 5/5 bilateral lower extremities in DF, PF, Inversion and Eversion. Deceased ROM in DF of ankle joint. Tender to medial calcaneal tubercle on the right . No pain along achilles PT or arch. Pes cavus noted on the right. Collapsed arch noted on the left. Negative tinels sign Neurological: Sensation intact to  light touch.   Assessment:   1. Plantar fasciitis, right   2. Hyperlipidemia associated with type 2 diabetes mellitus (HCC)   3. Other polyneuropathy      Plan:  Patient was evaluated and treated and all questions answered. Discussed plantar fasciitis with patient. Discussed neruopathy and radiculopathy as well.  Reviewed notes from PCP.  Continue gabapentin.  X-rays reviewed and discussed with patient. No acute fractures or dislocations noted. Mild spurring noted at inferior calcaneus.  Discussed treatment options including, ice, NSAIDS, supportive shoes, bracing, and stretching. Stretching exercises provided to be done on a daily basis.   Prescription for meloxicam provided and sent to pharmacy. PF brace dispensed.  Follow-up 6 weeks or sooner if any problems arise. In the meantime, encouraged to call the office with any questions, concerns, change in symptoms.     Louann Sjogren, DPM

## 2023-02-25 NOTE — Patient Instructions (Signed)

## 2023-03-15 ENCOUNTER — Other Ambulatory Visit: Payer: Self-pay | Admitting: Internal Medicine

## 2023-03-15 DIAGNOSIS — E11319 Type 2 diabetes mellitus with unspecified diabetic retinopathy without macular edema: Secondary | ICD-10-CM

## 2023-03-19 ENCOUNTER — Ambulatory Visit (HOSPITAL_BASED_OUTPATIENT_CLINIC_OR_DEPARTMENT_OTHER)
Admission: RE | Admit: 2023-03-19 | Discharge: 2023-03-19 | Disposition: A | Discharge status: Home / Self Care | Payer: 59 | Source: Ambulatory Visit | Attending: Cardiology | Admitting: Cardiology

## 2023-03-19 ENCOUNTER — Ambulatory Visit (HOSPITAL_COMMUNITY)
Admission: RE | Admit: 2023-03-19 | Discharge: 2023-03-19 | Disposition: A | Discharge status: Home / Self Care | Payer: 59 | Source: Ambulatory Visit | Attending: Cardiology | Admitting: Cardiology

## 2023-03-19 DIAGNOSIS — R42 Dizziness and giddiness: Secondary | ICD-10-CM | POA: Diagnosis present

## 2023-03-19 DIAGNOSIS — M79672 Pain in left foot: Secondary | ICD-10-CM | POA: Diagnosis present

## 2023-03-19 DIAGNOSIS — M79671 Pain in right foot: Secondary | ICD-10-CM

## 2023-03-19 LAB — VAS US ABI WITH/WO TBI
Left ABI: 1.25
Right ABI: 1.25

## 2023-04-04 ENCOUNTER — Other Ambulatory Visit: Payer: Self-pay | Admitting: Internal Medicine

## 2023-04-08 ENCOUNTER — Encounter: Payer: Self-pay | Admitting: Podiatry

## 2023-04-08 ENCOUNTER — Ambulatory Visit (INDEPENDENT_AMBULATORY_CARE_PROVIDER_SITE_OTHER): Payer: 59 | Admitting: Podiatry

## 2023-04-08 DIAGNOSIS — E785 Hyperlipidemia, unspecified: Secondary | ICD-10-CM

## 2023-04-08 DIAGNOSIS — M722 Plantar fascial fibromatosis: Secondary | ICD-10-CM | POA: Diagnosis not present

## 2023-04-08 DIAGNOSIS — E1169 Type 2 diabetes mellitus with other specified complication: Secondary | ICD-10-CM

## 2023-04-08 DIAGNOSIS — G6289 Other specified polyneuropathies: Secondary | ICD-10-CM

## 2023-04-08 MED ORDER — DEXAMETHASONE SODIUM PHOSPHATE 120 MG/30ML IJ SOLN
4.0000 mg | Freq: Once | INTRAMUSCULAR | Status: AC
Start: 2023-04-08 — End: 2023-04-08
  Administered 2023-04-08: 4 mg via INTRA_ARTICULAR

## 2023-04-08 MED ORDER — TRIAMCINOLONE ACETONIDE 10 MG/ML IJ SUSP
2.5000 mg | Freq: Once | INTRAMUSCULAR | Status: AC
Start: 2023-04-08 — End: 2023-04-08
  Administered 2023-04-08: 2.5 mg via INTRA_ARTICULAR

## 2023-04-08 NOTE — Progress Notes (Signed)
  Subjective:  Patient ID: Mitchell Palmer, male    DOB: 03-18-1966,   MRN: [id number]  Chief Complaint  Patient presents with   Numbness    Pt stated that he still gets numbness sensation mostly in is right foot.    57 y.o. male presents for follow-up of plantar fasciitis on the right and numbness in the feet. Relates numbness still present and seen back doctor. Relates still having heel pain and brace is helping and been stretching.  Denies any other pedal complaints. Denies n/v/f/c.   Past Medical History:  Diagnosis Date   Allergy    allegic rhinitis   Diabetes mellitus    type II   Esophageal stricture    GERD (gastroesophageal reflux disease)    History of hernia repair    as a baby   Hyperlipidemia    Hyperplastic polyp of intestine 2010   Hypertension    OSA (obstructive sleep apnea) 04/18/2022   Proliferative diabetic retinopathy of left eye (HCC) 03/31/2020   This is active as a patient still has tufts of the anterior segment neovascularization, rubeosis in the left eye every region of capillary nonperfusion should be treated with PRP.   S/P ear surgery, follow-up exam    for drainage   Vitreous hemorrhage of right eye (HCC) 12/24/2019    Objective:  Physical Exam: Vascular: DP/PT pulses 2/4 bilateral. CFT <3 seconds. Normal hair growth on digits. No edema.  Skin. No lacerations or abrasions bilateral feet.  Musculoskeletal: MMT 5/5 bilateral lower extremities in DF, PF, Inversion and Eversion. Deceased ROM in DF of ankle joint. Tender to medial calcaneal tubercle on the right . No pain along achilles PT or arch. Pes cavus noted on the right. Collapsed arch noted on the left. Negative tinels sign Neurological: Sensation intact to light touch.   Assessment:   1. Plantar fasciitis, right   2. Other polyneuropathy   3. Hyperlipidemia associated with type 2 diabetes mellitus (HCC)       Plan:  Patient was evaluated and treated and all questions answered. Discussed  plantar fasciitis with patient. Discussed neruopathy and radiculopathy as well.  Reviewed notes from PCP.  Continue gabapentin.  X-rays reviewed and discussed with patient. No acute fractures or dislocations noted. Mild spurring noted at inferior calcaneus.  Discussed treatment options including, ice, NSAIDS, supportive shoes, bracing, and stretching. Continue stretching and brace.  Injection provided today procedure below.  Follow-up 6 weeks or sooner if any problems arise. In the meantime, encouraged to call the office with any questions, concerns, change in symptoms.    Procedure: Injection Tendon/Ligament Discussed alternatives, risks, complications and verbal consent was obtained.  Location: Right plantar fascia . Skin Prep: Alcohol. Injectate: 1cc 0.5% marcaine plain, 1 cc dexamethasone 0.5 cc kenalog  Disposition: Patient tolerated procedure well. Injection site dressed with a band-aid.  Post-injection care was discussed and return precautions discussed.     Louann Sjogren, DPM

## 2023-04-09 ENCOUNTER — Ambulatory Visit: Payer: 59 | Admitting: Internal Medicine

## 2023-04-18 ENCOUNTER — Other Ambulatory Visit: Payer: Self-pay | Admitting: Family Medicine

## 2023-04-18 ENCOUNTER — Other Ambulatory Visit: Payer: Self-pay | Admitting: Oncology

## 2023-04-18 ENCOUNTER — Encounter (INDEPENDENT_AMBULATORY_CARE_PROVIDER_SITE_OTHER): Payer: 59 | Admitting: Cardiology

## 2023-04-18 DIAGNOSIS — I10 Essential (primary) hypertension: Secondary | ICD-10-CM | POA: Diagnosis not present

## 2023-04-18 DIAGNOSIS — Z006 Encounter for examination for normal comparison and control in clinical research program: Secondary | ICD-10-CM

## 2023-04-18 NOTE — Telephone Encounter (Signed)
Pt is overdue for CPE (labs prior), please schedule and then route back to me to refill, thanks

## 2023-04-18 NOTE — Telephone Encounter (Signed)
Patient scheduled.

## 2023-04-22 MED ORDER — OLMESARTAN MEDOXOMIL 5 MG PO TABS: 5.0000 mg | ORAL_TABLET | Freq: Every day | ORAL | 3 refills | Status: DC

## 2023-04-22 NOTE — Telephone Encounter (Signed)
Please see the MyChart message reply(ies) for my assessment and plan.   Let's go ahead and change the Benicar from 20mg  to 5mg  once a day. I sent in a prescription to [facility] for you.  Let me know how this works for you.    This patient gave consent for this Medical Advice Message and is aware that it may result in a bill to [employer], as well as the possibility of receiving a bill for a co-payment or deductible. They are an established patient, but are not seeking medical advice exclusively about a problem treated during an in person or video visit in the last seven days. I did not recommend an in person or video visit within seven days of my reply.    I spent a total of 6 minutes cumulative time within 7 days through [employer].  Donato Schultz, MD

## 2023-04-25 ENCOUNTER — Encounter: Payer: Self-pay | Admitting: Internal Medicine

## 2023-04-25 ENCOUNTER — Ambulatory Visit (INDEPENDENT_AMBULATORY_CARE_PROVIDER_SITE_OTHER): Payer: 59 | Admitting: Internal Medicine

## 2023-04-25 VITALS — BP 138/70 | HR 75 | Ht 69.0 in | Wt 206.6 lb

## 2023-04-25 DIAGNOSIS — Z23 Encounter for immunization: Secondary | ICD-10-CM

## 2023-04-25 DIAGNOSIS — Z7985 Long-term (current) use of injectable non-insulin antidiabetic drugs: Secondary | ICD-10-CM

## 2023-04-25 DIAGNOSIS — E039 Hypothyroidism, unspecified: Secondary | ICD-10-CM | POA: Diagnosis not present

## 2023-04-25 DIAGNOSIS — E7800 Pure hypercholesterolemia, unspecified: Secondary | ICD-10-CM

## 2023-04-25 DIAGNOSIS — E11319 Type 2 diabetes mellitus with unspecified diabetic retinopathy without macular edema: Secondary | ICD-10-CM

## 2023-04-25 DIAGNOSIS — Z7984 Long term (current) use of oral hypoglycemic drugs: Secondary | ICD-10-CM

## 2023-04-25 DIAGNOSIS — Z794 Long term (current) use of insulin: Secondary | ICD-10-CM

## 2023-04-25 LAB — HM DIABETES EYE EXAM

## 2023-04-25 LAB — MICROALBUMIN / CREATININE URINE RATIO
Creatinine,U: 59.7 mg/dL
Microalb Creat Ratio: 1.2 mg/g (ref 0.0–30.0)
Microalb, Ur: <0.7 mg/dL (ref 0.0–1.9)

## 2023-04-25 LAB — HEMOGLOBIN A1C: Hemoglobin A1C: 6.9

## 2023-04-25 NOTE — Progress Notes (Signed)
Continue subjective:     Patient ID: Spero Wehe, male   DOB: [birth year], 57 y.o.   MRN: [id number]   HPI Mr. Biers is a 57 y.o. man, returning for followup for DM2, dx in 2000, uncontrolled, insulin-dependent, with complications (CKD stage 3, erectile dysfunction, mild DR). Last visit 5 months ago.  Interim history: He continues to have steroid injections -he has significant generalized joint pains and previously had injections in shoulder, knee and since last OV: R foot injection.  He also had another injection in his shoulder.  Sugars are elevated for 2 to 3 days after these. No increased urination, nausea, chest pain.  He has blurry vision in the right eye (gets IO injections).  Reviewed HbA1c levels: Lab Results  Component Value Date   HGBA1C 7.3 (A) 11/27/2022   HGBA1C 6.8 (A) 07/25/2022   HGBA1C 7.3 (A) 03/21/2022   HGBA1C 8.3 (A) 11/06/2021   HGBA1C 8.1 (A) 02/22/2021   HGBA1C 8.0 (A) 10/17/2020   HGBA1C 7.8 (A) 05/11/2020   HGBA1C 7.5 (A) 01/07/2020   HGBA1C 7.5 (A) 09/08/2019   HGBA1C 7.1 (A) 04/16/2019   HGBA1C 7.6 (A) 09/03/2018   HGBA1C 8.2 (A) 05/21/2018   HGBA1C 7.4 (A) 01/27/2018   HGBA1C 7.6 10/21/2017   HGBA1C 8.0 05/10/2017   HGBA1C 7.4 11/15/2016   HGBA1C 7.6 08/15/2016   HGBA1C 7.7 05/15/2016   HGBA1C 7.8 02/15/2016   HGBA1C 7.7 11/08/2015   He is on: -  at today's visit he tells me that he actually stopped 1 year ago 2/2 fatigue - Jardiance 25 mg daily - Mounjaro 10 >> 12.5 >> 15 units weekly - Lantus 42 units at waking up and 50 units at bedtime >> 40 units 2x a day >> 40-46 units 2x a day >> 30-40 units 2x a day (use the lower dose before your shift) - Humalog 0-35 units for breakfast but 20-25 units before the rest of the meals >>  (15 min before each meal) 25-30 units for breakfast but 20-25 units before the rest of the meals Uses 10-15 units for correction.  He was on: - he stopped  Invokana 08/2016 as he had leg tingling - Cycloset - started  04/22/2013 >> at 1.6 mg daily (tried 3 tabs >> dizziness >> backed off to 2) - Victoza 1.8 mg daily - Actos 45 mg daily - Januvia - glyburide/metformin - Avandia. - Bydureon 2 mg weekly - he hated the thick needle  - Trulicity - Basaglar  He is checking his CBG more than 4 times a day with his newly acquired freestyle libre 3 CGM:  Previously:  Previously:   Lowest: 52 >> 59 (took too much Humalog for pizza) >> 60s >> 58;  He has hypoglycemia awareness in the 80s. Highest 400 (steroids) >> 3... 300s >> 350s.  He works nights: 6 PM to 7:30 AM 3-4 days a week.  Meals: - 5:30-6 pm (at work): subway sandwich >> 2 hot dogs + doritos >> chicken biscuit - snack at 9-10 pm: pistachios or 1/2 sandwich; pizza  >> 12 am: nabs +peanuts, apples - 11 am-12 pm: salad, meat + veggies + rice (leftovers from take out), pizza  + HL. Last lipid panel: Lab Results  Component Value Date   CHOL 142 02/11/2023   HDL 47.30 02/11/2023   LDLCALC 68 02/11/2023   LDLDIRECT 63.0 10/27/2021   TRIG 133.0 02/11/2023   CHOLHDL 3 02/11/2023  On Lipitor 40.  -+ Stage II CKD, last BUN/Cr:  Lab  Results  Component Value Date   BUN 17 02/11/2023   CREATININE 1.42 02/11/2023   Lab Results  Component Value Date   MICRALBCREAT 0.9 02/15/2016   MICRALBCREAT 1.0 12/07/2014   MICRALBCREAT 0.5 11/04/2012  On Benicar 40 >> 20 >> 5 mg daily.  - last eye exam was 12/24/2022: + DR.  Dr. Dione Booze >> now Dr. Luciana Axe.  He had laser surgery [facility].  Also, In 07/2018 >> had EMG surgery in his R eye for bleeding. He had cataract sx (Dr. Dione Booze), another surgery (Dr. Luciana Axe) on R eye in 2021.  Getting intraocular injections.  - no numbness or tingling in his legs.  Foot exam - 04/08/2023 (Dr. Ralene Cork).  He also has a history of HTN, GERD, esophageal stricture, plantar fasciitis, eustachian tube dysfunction - status post ear surgery.  Hypothyroidism: -Started levothyroxine 01/2016 -He was skipping doses in the past and TFTs were  fluctuating.  Pt is on levothyroxine 75 mcg daily, taken: - in am - fasting - at least 30 min from b'fast - no calcium - no iron - no multivitamins - no PPIs - not on Biotin  Reviewed his TFTs: Lab Results  Component Value Date   TSH 2.66 02/11/2023   TSH 4.19 10/27/2021   TSH 2.47 09/02/2020   TSH 1.85 10/20/2019   TSH 2.95 10/21/2018   TSH 5.18 (H) 09/18/2018   TSH 4.25 10/31/2017   TSH 1.17 09/21/2016   TSH 3.05 04/24/2016   TSH 4.97 (H) 03/06/2016   In 07/2018, he had C spine surgery.  Review of Systems + see HPI  I reviewed pt's medications, allergies, PMH, social hx, family hx, and changes were documented in the history of present illness. Otherwise, unchanged from my initial visit note.  Past Medical History:  Diagnosis Date   Allergy    allegic rhinitis   Diabetes mellitus    type II   Esophageal stricture    GERD (gastroesophageal reflux disease)    History of hernia repair    as a baby   Hyperlipidemia    Hyperplastic polyp of intestine 2010   Hypertension    OSA (obstructive sleep apnea) 04/18/2022   Proliferative diabetic retinopathy of left eye (HCC) 03/31/2020   This is active as a patient still has tufts of the anterior segment neovascularization, rubeosis in the left eye every region of capillary nonperfusion should be treated with PRP.   S/P ear surgery, follow-up exam    for drainage   Vitreous hemorrhage of right eye (HCC) 12/24/2019   Past Surgical History:  Procedure Laterality Date   BIOPSY  12/07/2021   Procedure: BIOPSY;  Surgeon: Meryl Dare, MD;  Location: [facility];  Service: Gastroenterology;;   CATARACT EXTRACTION     COLONOSCOPY  03/07/2016   COLONOSCOPY WITH PROPOFOL N/A 12/07/2021   Procedure: COLONOSCOPY WITH PROPOFOL;  Surgeon: Meryl Dare, MD;  Location: [facility];  Service: Gastroenterology;  Laterality: N/A;   ESOPHAGOGASTRODUODENOSCOPY  04/20/2009   stricture/GERD   ESOPHAGOGASTRODUODENOSCOPY (EGD) WITH  PROPOFOL N/A 12/07/2021   Procedure: ESOPHAGOGASTRODUODENOSCOPY (EGD) WITH PROPOFOL;  Surgeon: Meryl Dare, MD;  Location: [facility];  Service: Gastroenterology;  Laterality: N/A;   EYE SURGERY     HERNIA REPAIR     age 50-midline   INNER EAR SURGERY  08/20/1986   went in behind rt ear-blockage   NECK SURGERY     [facility]   POLYPECTOMY     SAVORY DILATION N/A 12/07/2021   Procedure: SAVORY DILATION;  Surgeon: Meryl Dare, MD;  Location: [facility];  Service: Gastroenterology;  Laterality: N/A;   SHOULDER ARTHROSCOPY WITH ROTATOR CUFF REPAIR AND SUBACROMIAL DECOMPRESSION Right 09/29/2012   Procedure: SHOULDER ARTHROSCOPY WITH ROTATOR CUFF REPAIR AND SUBACROMIAL DECOMPRESSION;  Surgeon: Mable Paris, MD;  Location:  SURGERY CENTER;  Service: Orthopedics;  Laterality: Right;  Right shoulder arthroscopy with subacromial decompression and distal clavicle excision & Debridement of Labrial Tear   SHOULDER SURGERY Left    x2   UPPER GASTROINTESTINAL ENDOSCOPY     Social History   Socioeconomic History   Marital status: Single    Spouse name: Not on file   Number of children: Not on file   Years of education: Not on file   Highest education level: Not on file  Occupational History   Occupation: Designer, television/film set  Tobacco Use   Smoking status: Never   Smokeless tobacco: Never  Vaping Use   Vaping status: Never Used  Substance and Sexual Activity   Alcohol use: Yes    Alcohol/week: 12.0 standard drinks of alcohol    Types: 12 Cans of beer per week   Drug use: No   Sexual activity: Not on file  Other Topics Concern   Not on file  Social History Narrative   Not on file   Social Determinants of Health   Financial Resource Strain: Not on file  Food Insecurity: Not on file  Transportation Needs: Not on file  Physical Activity: Not on file  Stress: Not on file  Social Connections: Not on file  Intimate Partner Violence: Not on file   Current  Outpatient Medications on File Prior to Visit  Medication Sig Dispense Refill   B-D UF III MINI PEN NEEDLES 31G X 5 MM MISC USE AS DIRECTED 4 TIMES A DAY 100 each 3   cetirizine (ZYRTEC) 10 MG tablet Take 10 mg by mouth daily.     Cholecalciferol (VITAMIN D) 50 MCG (2000 UT) tablet Take 2,000 Units by mouth daily.     Continuous Blood Gluc Sensor (FREESTYLE LIBRE 3 SENSOR) MISC 1 applicator by Does not apply route every 14 (fourteen) days. 6 each 3   cyclobenzaprine (FLEXERIL) 10 MG tablet Take 1 tablet (10 mg total) by mouth 3 (three) times daily as needed. 30 tablet 3   empagliflozin (JARDIANCE) 25 MG TABS tablet TAKE 1 TABLET BY MOUTH EVERY DAY BEFORE BREAKFAST 90 tablet 3   fluticasone (FLONASE) 50 MCG/ACT nasal spray PLACE 2 SPRAYS INTO BOTH NOSTRILS DAILY AS NEEDED FOR ALLERGIES OR RHINITIS. 16 mL 2   gabapentin (NEURONTIN) 100 MG capsule Take 1 capsule (100 mg total) by mouth 2 (two) times daily. 60 capsule 1   insulin glargine (LANTUS SOLOSTAR) 100 UNIT/ML Solostar Pen INJECT 42 UNITS IN THE MORNING AND 50 UNITS IN THE EVENING 60 mL 3   insulin lispro (HUMALOG KWIKPEN) 100 UNIT/ML KwikPen INJECT 15-25 UNITS INTO THE SKIN 3 TIMES DAILY W/ MEALS AND 10-12 UNITS INTO THE SKIN W/ SNACKS 60 mL 3   levothyroxine (SYNTHROID) 75 MCG tablet TAKE 1 TABLET BY MOUTH DAILY BEFORE BREAKFAST 90 tablet 1   meloxicam (MOBIC) 15 MG tablet Take 1 tablet (15 mg total) by mouth daily. 30 tablet 0   metFORMIN (GLUCOPHAGE) 1000 MG tablet Take 1 tablet (1,000 mg total) by mouth 2 (two) times daily with a meal. (Patient taking differently: Take 1,000 mg by mouth daily.) 90 tablet 3   MOUNJARO 15 MG/0.5ML Pen INJECT 15 MG UNDER THE SKIN ONCE  WEEKLY 2 mL 5   olmesartan (BENICAR) 5 MG tablet Take 1 tablet (5 mg total) by mouth daily. 90 tablet 3   omeprazole (PRILOSEC) 40 MG capsule TAKE 1 CAPSULE BY MOUTH EVERY DAY 30 capsule 0   ONETOUCH ULTRA test strip USE TO TEST BLOOD SUGAR 4 TIMES DAILY AS DIRECTED 400 strip 11    rosuvastatin (CRESTOR) 10 MG tablet TAKE 1 TABLET BY MOUTH EVERY DAY 30 tablet 0   tadalafil (CIALIS) 20 MG tablet Take 1 tablet (20 mg total) by mouth daily as needed for erectile dysfunction. 4 tablet 5   vitamin B-12 (CYANOCOBALAMIN) 500 MCG tablet Take 500 mcg by mouth daily.     No current facility-administered medications on file prior to visit.   Allergies  Allergen Reactions   Codeine     Anxious   Lipitor [Atorvastatin]     Muscle pain    Family History  Problem Relation Age of Onset   Diabetes Mother    Colon cancer Father 46   Diabetes Other    Colon polyps Brother    Diabetes Maternal Aunt    Cancer Cousin        breast cancer     Objective:   Physical Exam BP 138/70   Pulse 75   Ht 5\' 9"  (1.753 m)   Wt 206 lb 9.6 oz (93.7 kg)   SpO2 96%   BMI 30.51 kg/m     Wt Readings from Last 3 Encounters:  04/25/23 206 lb 9.6 oz (93.7 kg)  02/22/23 209 lb (94.8 kg)  02/08/23 208 lb (94.3 kg)   Constitutional: overweight, in NAD Eyes: no exophthalmos ENT: no masses palpated in neck, no cervical lymphadenopathy Cardiovascular: RRR, No MRG Respiratory: CTA B Musculoskeletal: no deformities Skin: no rashes Neurological: no tremor with outstretched hands  Assessment:     1. 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 HL  3. Hypothyroidism    Plan:     1.  Patient with longstanding, uncontrolled, type 2 diabetes, on a complex medication regimen including metformin, SGLT2 inhibitor, weekly high-dose GLP-1/GIP receptor agonist and also basal-bolus insulin regimen, adjusted at last visit, with still poor control.  At last visit, HbA1c was higher, at 7.3%, increased from 6.8% at the previous visit.  Sugars were fluctuating in the upper normal range, with higher blood sugars after 6 AM and then again late afternoon, around  dinnertime.  He was telling me that he felt that his sugars were dropping when he was more active at work and he was reducing the Lantus dose whenever he was more active at work.  We discussed about also reducing Humalog with dinner to avoid low blood sugars overnight but we otherwise did not change the regimen.  We did discuss about increasing hydration and also I recommended exercise to improve his insulin resistance.  Unfortunately, he continues to take steroid injections on a regular basis, which are a major impediment in his diabetes control. CGM interpretation: -At today's visit, we reviewed his CGM downloads: It appears that 70% of values are in target range (goal >70%), while 29% are higher than 180 (goal <25%), and 1% are lower than 70 (goal <4%).  The calculated average blood sugar is 155.  The projected HbA1c for the next 3 months (GMI) is 7.0%. -Reviewing the CGM trends, sugars appear to be slightly better than before,  fluctuating mainly within the target range but with hyperglycemic spikes after meals.  In the last 4 days, however, sugars have been mostly at goal.  He does not identify any changes in the last 4 days.  He does mention that he actually stopped metformin approximately a year ago due to fatigue to see if this would improve.  Indeed, he felt better after stopping it so he ended up not restarting.  We discussed about the many benefits of metformin, but I do not feel that this is absolutely necessary for now, especially as he felt fatigued while on it. -He does mention that he is cautious about taking insulin as sometimes his sugars drop after taking the full dose as recommended.  I advised him to use the lower doses in the recommended intervals and to continue with [facility] and Jardiance.  He definitely needs a change in diet, also. - I advised him to: Patient Instructions  Please continue: - Jardiance 25 mg before breakfast - Mounjaro 15 mg weekly - Humalog (15 min before each  meal) 25-30 units for breakfast but 20-25 units before the rest of the meals - Lantus 30-40 units 2x a day (use the lower dose before your shift)  Please continue levothyroxine 75 mcg daily.  Take the thyroid hormone every day, with water, at least 30 minutes before breakfast, separated by at least 4 hours from: - acid reflux medications - calcium - iron - multivitamins  Please return in 4 months.  - we checked his HbA1c: 6.9% (lower) - advised to check sugars at different times of the day - 4x a day, rotating check times - advised for yearly eye exams >> he is UTD - return to clinic in 4 months  2. HL  -Reviewed latest lipid panel from 01/2023: Fractions at goal: Lab Results  Component Value Date   CHOL 142 02/11/2023   HDL 47.30 02/11/2023   LDLCALC 68 02/11/2023   LDLDIRECT 63.0 10/27/2021   TRIG 133.0 02/11/2023   CHOLHDL 3 02/11/2023  -He is on Lipitor 40 mg daily without side effects  3. Hypothyroidism - latest thyroid labs reviewed with pt. >> normal: Lab Results  Component Value Date   TSH 2.66 02/11/2023  - he continues on LT4 75 mcg daily - pt feels good on this dose. - we discussed about taking the thyroid hormone every day, with water, >30 minutes before breakfast, separated by >4 hours from acid reflux medications, calcium, iron, multivitamins. Pt. is taking it correctly.  + flu shot today  Carlus Pavlov, MD PhD [facility]

## 2023-04-25 NOTE — Patient Instructions (Addendum)
Please continue: - Jardiance 25 mg before breakfast - Mounjaro 15 mg weekly - Humalog (15 min before each meal) 25-30 units for breakfast but 20-25 units before the rest of the meals - Lantus 30-40 units 2x a day (use the lower dose before your shift)  Please continue levothyroxine 75 mcg daily.  Take the thyroid hormone every day, with water, at least 30 minutes before breakfast, separated by at least 4 hours from: - acid reflux medications - calcium - iron - multivitamins  Please return in 4 months.

## 2023-04-26 ENCOUNTER — Encounter: Payer: Self-pay | Admitting: Family Medicine

## 2023-04-26 ENCOUNTER — Other Ambulatory Visit: Payer: Self-pay | Admitting: Family Medicine

## 2023-04-26 NOTE — Telephone Encounter (Signed)
CPE is on 05/08/23  Gabapentin last filled on 02/08/23 #60 tabs/ 1 refill

## 2023-04-27 ENCOUNTER — Other Ambulatory Visit: Payer: Self-pay | Admitting: Family Medicine

## 2023-04-30 ENCOUNTER — Telehealth: Payer: Self-pay | Admitting: Family Medicine

## 2023-04-30 DIAGNOSIS — R7989 Other specified abnormal findings of blood chemistry: Secondary | ICD-10-CM

## 2023-04-30 DIAGNOSIS — E1169 Type 2 diabetes mellitus with other specified complication: Secondary | ICD-10-CM

## 2023-04-30 DIAGNOSIS — E039 Hypothyroidism, unspecified: Secondary | ICD-10-CM

## 2023-04-30 DIAGNOSIS — Z79899 Other long term (current) drug therapy: Secondary | ICD-10-CM

## 2023-04-30 DIAGNOSIS — I10 Essential (primary) hypertension: Secondary | ICD-10-CM

## 2023-04-30 DIAGNOSIS — Z125 Encounter for screening for malignant neoplasm of prostate: Secondary | ICD-10-CM

## 2023-04-30 NOTE — Telephone Encounter (Signed)
-----   Message from Alvina Chou sent at 04/19/2023  9:57 AM EDT ----- Regarding: lab orders for Wed, 9.11.24 Patient is scheduled for CPX labs, please order future labs, Thanks , Camelia Eng

## 2023-05-01 ENCOUNTER — Other Ambulatory Visit (INDEPENDENT_AMBULATORY_CARE_PROVIDER_SITE_OTHER): Payer: 59

## 2023-05-01 DIAGNOSIS — I10 Essential (primary) hypertension: Secondary | ICD-10-CM

## 2023-05-01 DIAGNOSIS — R7989 Other specified abnormal findings of blood chemistry: Secondary | ICD-10-CM

## 2023-05-01 DIAGNOSIS — E039 Hypothyroidism, unspecified: Secondary | ICD-10-CM | POA: Diagnosis not present

## 2023-05-01 DIAGNOSIS — E1169 Type 2 diabetes mellitus with other specified complication: Secondary | ICD-10-CM

## 2023-05-01 DIAGNOSIS — E785 Hyperlipidemia, unspecified: Secondary | ICD-10-CM | POA: Diagnosis not present

## 2023-05-01 DIAGNOSIS — Z125 Encounter for screening for malignant neoplasm of prostate: Secondary | ICD-10-CM | POA: Diagnosis not present

## 2023-05-01 DIAGNOSIS — Z79899 Other long term (current) drug therapy: Secondary | ICD-10-CM | POA: Diagnosis not present

## 2023-05-01 LAB — CBC WITH DIFFERENTIAL/PLATELET
Basophils Absolute: 0.1 10*3/uL (ref 0.0–0.1)
Basophils Relative: 0.8 % (ref 0.0–3.0)
Eosinophils Absolute: 0.3 10*3/uL (ref 0.0–0.7)
Eosinophils Relative: 5 % (ref 0.0–5.0)
HCT: 50.3 % (ref 39.0–52.0)
Hemoglobin: 17.1 g/dL — ABNORMAL HIGH (ref 13.0–17.0)
Lymphocytes Relative: 25.7 % (ref 12.0–46.0)
Lymphs Abs: 1.8 10*3/uL (ref 0.7–4.0)
MCHC: 34.1 g/dL (ref 30.0–36.0)
MCV: 99.4 fl (ref 78.0–100.0)
Monocytes Absolute: 0.8 10*3/uL (ref 0.1–1.0)
Monocytes Relative: 12.2 % — ABNORMAL HIGH (ref 3.0–12.0)
Neutro Abs: 3.9 10*3/uL (ref 1.4–7.7)
Neutrophils Relative %: 56.3 % (ref 43.0–77.0)
Platelets: 236 10*3/uL (ref 150.0–400.0)
RBC: 5.06 Mil/uL (ref 4.22–5.81)
RDW: 12.6 % (ref 11.5–15.5)
WBC: 6.8 10*3/uL (ref 4.0–10.5)

## 2023-05-01 LAB — LIPID PANEL
Cholesterol: 129 mg/dL (ref 0–200)
HDL: 50.5 mg/dL (ref >39.00)
LDL Cholesterol: 53 mg/dL (ref 0–99)
NonHDL: 78.58
Total CHOL/HDL Ratio: 3
Triglycerides: 126 mg/dL (ref 0.0–149.0)
VLDL: 25.2 mg/dL (ref 0.0–40.0)

## 2023-05-01 LAB — COMPREHENSIVE METABOLIC PANEL
ALT: 20 U/L (ref 0–53)
AST: 20 U/L (ref 0–37)
Albumin: 4 g/dL (ref 3.5–5.2)
Alkaline Phosphatase: 93 U/L (ref 39–117)
BUN: 17 mg/dL (ref 6–23)
CO2: 32 meq/L (ref 19–32)
Calcium: 9.6 mg/dL (ref 8.4–10.5)
Chloride: 100 meq/L (ref 96–112)
Creatinine, Ser: 1.18 mg/dL (ref 0.40–1.50)
GFR: 69.16 mL/min (ref >60.00)
Glucose, Bld: 174 mg/dL — ABNORMAL HIGH (ref 70–99)
Potassium: 4.7 meq/L (ref 3.5–5.1)
Sodium: 139 meq/L (ref 135–145)
Total Bilirubin: 0.6 mg/dL (ref 0.2–1.2)
Total Protein: 6.2 g/dL (ref 6.0–8.3)

## 2023-05-01 LAB — VITAMIN B12: Vitamin B-12: 472 pg/mL (ref 211–911)

## 2023-05-01 LAB — VITAMIN D 25 HYDROXY (VIT D DEFICIENCY, FRACTURES): VITD: 39.16 ng/mL (ref 30.00–100.00)

## 2023-05-01 LAB — PSA: PSA: 0.61 ng/mL (ref 0.10–4.00)

## 2023-05-01 LAB — TSH: TSH: 2.3 u[IU]/mL (ref 0.35–5.50)

## 2023-05-02 ENCOUNTER — Other Ambulatory Visit: Payer: 59

## 2023-05-06 ENCOUNTER — Encounter: Payer: Self-pay | Admitting: Family Medicine

## 2023-05-08 ENCOUNTER — Ambulatory Visit (INDEPENDENT_AMBULATORY_CARE_PROVIDER_SITE_OTHER): Payer: 59 | Admitting: Family Medicine

## 2023-05-08 ENCOUNTER — Encounter: Payer: Self-pay | Admitting: Family Medicine

## 2023-05-08 VITALS — BP 124/68 | HR 71 | Temp 98.0°F | Ht 68.0 in | Wt 198.0 lb

## 2023-05-08 DIAGNOSIS — Z789 Other specified health status: Secondary | ICD-10-CM

## 2023-05-08 DIAGNOSIS — E039 Hypothyroidism, unspecified: Secondary | ICD-10-CM

## 2023-05-08 DIAGNOSIS — Z79899 Other long term (current) drug therapy: Secondary | ICD-10-CM

## 2023-05-08 DIAGNOSIS — G4733 Obstructive sleep apnea (adult) (pediatric): Secondary | ICD-10-CM

## 2023-05-08 DIAGNOSIS — E11319 Type 2 diabetes mellitus with unspecified diabetic retinopathy without macular edema: Secondary | ICD-10-CM

## 2023-05-08 DIAGNOSIS — Z125 Encounter for screening for malignant neoplasm of prostate: Secondary | ICD-10-CM

## 2023-05-08 DIAGNOSIS — R7989 Other specified abnormal findings of blood chemistry: Secondary | ICD-10-CM

## 2023-05-08 DIAGNOSIS — G6289 Other specified polyneuropathies: Secondary | ICD-10-CM | POA: Diagnosis not present

## 2023-05-08 DIAGNOSIS — E113592 Type 2 diabetes mellitus with proliferative diabetic retinopathy without macular edema, left eye: Secondary | ICD-10-CM

## 2023-05-08 DIAGNOSIS — Z0000 Encounter for general adult medical examination without abnormal findings: Secondary | ICD-10-CM

## 2023-05-08 DIAGNOSIS — Z794 Long term (current) use of insulin: Secondary | ICD-10-CM

## 2023-05-08 DIAGNOSIS — E669 Obesity, unspecified: Secondary | ICD-10-CM

## 2023-05-08 NOTE — Assessment & Plan Note (Signed)
Utd eye care  Dm is better controlled

## 2023-05-08 NOTE — Assessment & Plan Note (Signed)
Gabapentin has helped discomfort (not numbness) Wants to continue it  If pain returns can consider increase dose

## 2023-05-08 NOTE — Assessment & Plan Note (Signed)
Reviewed health habits including diet and exercise and skin cancer prevention Reviewed appropriate screening tests for age  Also reviewed health mt list, fam hx and immunization status , as well as social and family history   See HPI Labs reviewed and ordered Sent for shingrix vaccine dates Utd flu shot  Utd eye care  Psa stable  Colonoscopy 11/2021- suspect 5 y recall for fam history  Discussed fall prevention, supplements and exercise for bone density  (D level is normal now) Encouraged less etoh intake  PHQ 3 - due to fatigue

## 2023-05-08 NOTE — Assessment & Plan Note (Signed)
Lab Results  Component Value Date   PSA 0.61 05/01/2023   PSA 0.66 10/27/2021   PSA 0.60 09/02/2020    No change in voiding  Nocturia 0-1  Brother has recent elevated psa and is being worked up for that currently

## 2023-05-08 NOTE — Assessment & Plan Note (Signed)
Seeing endocrinology  Per pt last A1c 6.9  Mounjaro has helped weight loss  Continues jardiance, lantus, humalog  Stopped metformin

## 2023-05-08 NOTE — Assessment & Plan Note (Signed)
With co morbidities Lost over 50 lb with mounjaro for dm  Discussed how this problem influences overall health and the risks it imposes  Reviewed plan for weight loss with lower calorie diet (via better food choices (lower glycemic and portion control) along with exercise building up to or more than 30 minutes 5 days per week including some aerobic activity and strength training

## 2023-05-08 NOTE — Assessment & Plan Note (Signed)
Continues cpap use

## 2023-05-08 NOTE — Assessment & Plan Note (Signed)
Last vitamin D Lab Results  Component Value Date   VD25OH 39.16 05/01/2023   Vitamin D level is therapeutic with current supplementation Disc importance of this to bone and overall health

## 2023-05-08 NOTE — Assessment & Plan Note (Signed)
Less than he used to drink but still binges when not working Discussed risks of etoh (including effect on dm)  Encouraged to cut back to 2 or less drinks per day   LFTs are in normal range

## 2023-05-08 NOTE — Progress Notes (Signed)
Subjective:    Patient ID: Mitchell Palmer, male    DOB: 1966-01-29, 57 y.o.   MRN: [id number]  HPI  Here for health maintenance exam and to review chronic medical problems   Wt Readings from Last 3 Encounters:  05/08/23 198 lb (89.8 kg)  04/25/23 206 lb 9.6 oz (93.7 kg)  02/22/23 209 lb (94.8 kg)   30.11 kg/m  Vitals:   05/08/23 1415  BP: 124/68  Pulse: 71  Temp: 98 F (36.7 C)  SpO2: 95%    Immunization History  Administered Date(s) Administered   Hep A / Hep B 03/26/2018, 04/28/2018, 09/30/2018   Influenza Split 06/17/2012   Influenza Whole 06/20/2010   Influenza, Seasonal, Injecte, Preservative Fre 04/25/2023   Influenza,inj,Quad PF,6+ Mos 06/03/2013, 06/30/2014, 07/22/2015, 05/15/2016, 05/10/2017, 04/28/2018, 05/28/2019, 05/31/2020, 05/24/2021, 05/15/2022   PFIZER(Purple Top)SARS-COV-2 Vaccination 03/16/2020, 04/08/2020   Pneumococcal Polysaccharide-23 10/12/2011, 02/27/2017   Td 08/21/1999, 09/14/2020   Tdap 01/12/2011   Zoster Recombinant(Shingrix) 05/23/2022, 01/12/2023    There are no preventive care reminders to display for this patient.  Thinks he re injured right knee  Knee popped coming out of truck - sees ortho tomorrow Has had gel shots in both knees recently and it helped   Shingrix vaccine -had both   Had flu vaccine this season   Eye exam was this month -retinopatly Sees Dr Luciana Axe frequently    Prostate health Lab Results  Component Value Date   PSA 0.61 05/01/2023   PSA 0.66 10/27/2021   PSA 0.60 09/02/2020   Older brother has elevated psa - working that up  No diagnosis   No urinary complaints  Occational nocuturia -if so once    Colon cancer screening -colonoscopy 11/2021 Father had colon cancer at 77    Bone health   Falls-none  Fractures-none  Supplements - taking vit D  Last vitamin D Lab Results  Component Value Date   VD25OH 39.16 05/01/2023   Exercise: nothing regular  Lot of stairs at work  No heavy lifting   Re doing house right now -will have exercise room    Etoh intake :  Has 2-3 drinks (mixed) on off days  Vacation as well  Work days no alcohol   Lab Results  Component Value Date   ALT 20 05/01/2023   AST 20 05/01/2023   ALKPHOS 93 05/01/2023   BILITOT 0.6 05/01/2023      Mood    05/08/2023    2:32 PM 02/08/2023   12:03 PM 11/06/2021   10:31 AM 09/14/2020   12:10 PM 11/13/2019   11:46 AM  Depression screen PHQ 2/9  Decreased Interest 0 1 0 1 0  Down, Depressed, Hopeless 0 1 0 1 0  PHQ - 2 Score 0 2 0 2 0  Altered sleeping 0 0  1 0  Tired, decreased energy 0 1  2 1   Change in appetite 0 0  0 0  Feeling bad or failure about yourself  0 0  0 0  Trouble concentrating 0 0  0 0  Moving slowly or fidgety/restless 0 0  0 0  Suicidal thoughts 0 0  0 0  PHQ-9 Score 0 3  5 1   Difficult doing work/chores Not difficult at all Not difficult at all  Not difficult at all Not difficult at all   HTN bp is stable today  No cp or palpitations or headaches or edema  No side effects to medicines  BP Readings from Last 3 Encounters:  05/08/23 124/68  04/25/23 138/70  02/22/23 118/70    Benicar 40 mg daily   Using cpap for OSA   Lab Results  Component Value Date   NA 139 05/01/2023   K 4.7 05/01/2023   CO2 32 05/01/2023   GLUCOSE 174 (H) 05/01/2023   BUN 17 05/01/2023   CREATININE 1.18 05/01/2023   CALCIUM 9.6 05/01/2023   GFR 69.16 05/01/2023   GFRNONAA >60 07/23/2022   DM with retinopathy and neuropathy  Under care of endocrinology  Mounjaro -has helped weight loss (less appetite)  Jardiance lantus  Humalog Stopped metformin and he feels ok without it -feels better without it  Last A1c 7.3 - down ot 6.9   Hypothyroidism  Pt has no clinical changes No change in energy level/ hair or skin/ edema and no tremor Lab Results  Component Value Date   TSH 2.30 05/01/2023    Levothyroxine 75 mcg daily   Hyperlipidemia Lab Results  Component Value Date   CHOL 129  05/01/2023   CHOL 142 02/11/2023   CHOL 133 10/27/2021   Lab Results  Component Value Date   HDL 50.50 05/01/2023   HDL 47.30 02/11/2023   HDL 45.50 10/27/2021   Lab Results  Component Value Date   LDLCALC 53 05/01/2023   LDLCALC 68 02/11/2023   LDLCALC 37 10/20/2019   Lab Results  Component Value Date   TRIG 126.0 05/01/2023   TRIG 133.0 02/11/2023   TRIG 210.0 (H) 10/27/2021   Lab Results  Component Value Date   CHOLHDL 3 05/01/2023   CHOLHDL 3 02/11/2023   CHOLHDL 3 10/27/2021   Lab Results  Component Value Date   LDLDIRECT 63.0 10/27/2021   LDLDIRECT 67.0 05/24/2021   LDLDIRECT 65.0 09/02/2020   LL 53 On crestor 10 mg daily    GERD Omeprazole 40 mg daily  Lab Results  Component Value Date   VITAMINB12 472 05/01/2023   Last vitamin D Lab Results  Component Value Date   VD25OH 39.16 05/01/2023   Remote history of low testosterone  Some fatigue      Patient Active Problem List   Diagnosis Date Noted   Peripheral neuropathy 02/08/2023   Heel pain, bilateral 02/08/2023   Dyspnea on exertion 07/30/2022   OSA (obstructive sleep apnea) 04/18/2022   Loud snoring 12/12/2021   Family history of colon cancer    Lump on finger 11/06/2021   Pseudophakia, left eye 10/26/2021   Pseudophakia, right eye 10/26/2021   Vitreomacular adhesion of left eye 09/21/2021   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/2021   Vitreous hemorrhage, left eye (HCC) 06/07/2021   Proliferative diabetic retinopathy of left eye with macular edema associated with type 2 diabetes mellitus (HCC) 06/07/2021   Localized swelling, mass and lump, upper limb 05/31/2021   Low vitamin D level 09/14/2020   Prostate cancer screening 09/02/2020   Current use of proton pump inhibitor 09/02/2020   Family history of factor V Leiden mutation 05/30/2020   Left ankle pain 01/29/2020   Obesity (BMI 30-39.9) 12/24/2019   Proliferative diabetic retinopathy of left eye determined by examination (HCC)  12/24/2019   Nuclear sclerotic cataract of left eye 12/24/2019   Low testosterone 11/13/2019   Fatigue 10/20/2019   Neck pain 04/28/2018   Need for hepatitis B screening test 03/26/2018   Need for hepatitis C screening test 03/26/2018   Paresthesia of both feet 11/01/2017   Hemorrhoids 10/14/2017   Class 2 obesity due to excess calories with body mass index (  BMI) of 35.0 to 35.9 in adult 01/24/2016   Hypothyroidism 01/24/2016   Type 2 diabetes mellitus with ophthalmic complication (HCC) 07/28/2015   Alcohol consumption heavy 12/06/2014   Routine general medical examination at a health care facility 09/30/2013   Low back pain 06/17/2012   PLANTAR FASCIITIS, TRAUMATIC 11/09/2009   NEOPLASM UNCERTAIN BEHAVIOR OTHER SPEC SITES 10/07/2009   ESOPHAGEAL STRICTURE 06/27/2009   GERD 03/31/2009   Hyperlipidemia associated with type 2 diabetes mellitus (HCC) 01/06/2009   Essential hypertension 01/06/2009   Allergic rhinitis 01/06/2009   Past Medical History:  Diagnosis Date   Allergy    allegic rhinitis   Diabetes mellitus    type II   Esophageal stricture    GERD (gastroesophageal reflux disease)    History of hernia repair    as a baby   Hyperlipidemia    Hyperplastic polyp of intestine 2010   Hypertension    OSA (obstructive sleep apnea) 04/18/2022   Proliferative diabetic retinopathy of left eye (HCC) 03/31/2020   This is active as a patient still has tufts of the anterior segment neovascularization, rubeosis in the left eye every region of capillary nonperfusion should be treated with PRP.   S/P ear surgery, follow-up exam    for drainage   Vitreous hemorrhage of right eye (HCC) 12/24/2019   Past Surgical History:  Procedure Laterality Date   BIOPSY  12/07/2021   Procedure: BIOPSY;  Surgeon: Meryl Dare, MD;  Location: [facility];  Service: Gastroenterology;;   CATARACT EXTRACTION     COLONOSCOPY  03/07/2016   COLONOSCOPY WITH PROPOFOL N/A 12/07/2021   Procedure:  COLONOSCOPY WITH PROPOFOL;  Surgeon: Meryl Dare, MD;  Location: [facility];  Service: Gastroenterology;  Laterality: N/A;   ESOPHAGOGASTRODUODENOSCOPY  04/20/2009   stricture/GERD   ESOPHAGOGASTRODUODENOSCOPY (EGD) WITH PROPOFOL N/A 12/07/2021   Procedure: ESOPHAGOGASTRODUODENOSCOPY (EGD) WITH PROPOFOL;  Surgeon: Meryl Dare, MD;  Location: [facility];  Service: Gastroenterology;  Laterality: N/A;   EYE SURGERY     HERNIA REPAIR     age 27-midline   INNER EAR SURGERY  08/20/1986   went in behind rt ear-blockage   NECK SURGERY     [facility]   POLYPECTOMY     SAVORY DILATION N/A 12/07/2021   Procedure: SAVORY DILATION;  Surgeon: Meryl Dare, MD;  Location: [facility];  Service: Gastroenterology;  Laterality: N/A;   SHOULDER ARTHROSCOPY WITH ROTATOR CUFF REPAIR AND SUBACROMIAL DECOMPRESSION Right 09/29/2012   Procedure: SHOULDER ARTHROSCOPY WITH ROTATOR CUFF REPAIR AND SUBACROMIAL DECOMPRESSION;  Surgeon: Mable Paris, MD;  Location: [facility];  Service: Orthopedics;  Laterality: Right;  Right shoulder arthroscopy with subacromial decompression and distal clavicle excision & Debridement of Labrial Tear   SHOULDER SURGERY Left    x2   UPPER GASTROINTESTINAL ENDOSCOPY     Social History   Tobacco Use   Smoking status: Never   Smokeless tobacco: Never  Vaping Use   Vaping status: Never Used  Substance Use Topics   Alcohol use: Yes    Alcohol/week: 12.0 standard drinks of alcohol    Types: 12 Cans of beer per week   Drug use: No   Family History  Problem Relation Age of Onset   Diabetes Mother    Colon cancer Father 40   Diabetes Other    Colon polyps Brother    Diabetes Maternal Aunt    Cancer Cousin        breast cancer   Allergies  Allergen Reactions  Codeine     Anxious   Lipitor [Atorvastatin]     Muscle pain    Current Outpatient Medications on File Prior to Visit  Medication Sig Dispense Refill   B-D UF III MINI  PEN NEEDLES 31G X 5 MM MISC USE AS DIRECTED 4 TIMES A DAY 100 each 3   cetirizine (ZYRTEC) 10 MG tablet Take 10 mg by mouth daily.     Cholecalciferol (VITAMIN D) 50 MCG (2000 UT) tablet Take 2,000 Units by mouth daily.     Continuous Blood Gluc Sensor (FREESTYLE LIBRE 3 SENSOR) MISC 1 applicator by Does not apply route every 14 (fourteen) days. 6 each 3   cyclobenzaprine (FLEXERIL) 10 MG tablet Take 1 tablet (10 mg total) by mouth 3 (three) times daily as needed. 30 tablet 3   empagliflozin (JARDIANCE) 25 MG TABS tablet TAKE 1 TABLET BY MOUTH EVERY DAY BEFORE BREAKFAST 90 tablet 3   fluticasone (FLONASE) 50 MCG/ACT nasal spray PLACE 2 SPRAYS INTO BOTH NOSTRILS DAILY AS NEEDED FOR ALLERGIES OR RHINITIS. 16 mL 2   gabapentin (NEURONTIN) 100 MG capsule TAKE 1 CAPSULE BY MOUTH TWICE A DAY 60 capsule 0   insulin glargine (LANTUS SOLOSTAR) 100 UNIT/ML Solostar Pen INJECT 42 UNITS IN THE MORNING AND 50 UNITS IN THE EVENING 60 mL 3   insulin lispro (HUMALOG KWIKPEN) 100 UNIT/ML KwikPen INJECT 15-25 UNITS INTO THE SKIN 3 TIMES DAILY W/ MEALS AND 10-12 UNITS INTO THE SKIN W/ SNACKS 60 mL 3   levothyroxine (SYNTHROID) 75 MCG tablet TAKE 1 TABLET BY MOUTH DAILY BEFORE BREAKFAST 90 tablet 1   meloxicam (MOBIC) 15 MG tablet Take 1 tablet (15 mg total) by mouth daily. 30 tablet 0   MOUNJARO 15 MG/0.5ML Pen INJECT 15 MG UNDER THE SKIN ONCE WEEKLY 2 mL 5   olmesartan (BENICAR) 5 MG tablet Take 1 tablet (5 mg total) by mouth daily. 90 tablet 3   omeprazole (PRILOSEC) 40 MG capsule TAKE 1 CAPSULE BY MOUTH EVERY DAY 30 capsule 0   ONETOUCH ULTRA test strip USE TO TEST BLOOD SUGAR 4 TIMES DAILY AS DIRECTED 400 strip 11   rosuvastatin (CRESTOR) 10 MG tablet TAKE 1 TABLET BY MOUTH EVERY DAY 30 tablet 0   tadalafil (CIALIS) 20 MG tablet Take 1 tablet (20 mg total) by mouth daily as needed for erectile dysfunction. 4 tablet 5   vitamin B-12 (CYANOCOBALAMIN) 500 MCG tablet Take 500 mcg by mouth daily.     No current  facility-administered medications on file prior to visit.    Review of Systems  Constitutional:  Negative for activity change, appetite change, fatigue, fever and unexpected weight change.  HENT:  Negative for congestion, rhinorrhea, sore throat and trouble swallowing.   Eyes:  Negative for pain, redness, itching and visual disturbance.  Respiratory:  Negative for cough, chest tightness, shortness of breath and wheezing.   Cardiovascular:  Negative for chest pain and palpitations.  Gastrointestinal:  Negative for abdominal pain, blood in stool, constipation, diarrhea and nausea.  Endocrine: Negative for cold intolerance, heat intolerance, polydipsia and polyuria.  Genitourinary:  Negative for difficulty urinating, dysuria, frequency and urgency.  Musculoskeletal:  Positive for arthralgias. Negative for joint swelling and myalgias.  Skin:  Negative for pallor and rash.  Neurological:  Negative for dizziness, tremors, weakness, numbness and headaches.  Hematological:  Negative for adenopathy. Does not bruise/bleed easily.  Psychiatric/Behavioral:  Negative for decreased concentration and dysphoric mood. The patient is not nervous/anxious.  Objective:   Physical Exam Constitutional:      General: He is not in acute distress.    Appearance: Normal appearance. He is well-developed. He is obese. He is not ill-appearing or diaphoretic.  HENT:     Head: Normocephalic and atraumatic.     Right Ear: Tympanic membrane, ear canal and external ear normal.     Left Ear: Tympanic membrane, ear canal and external ear normal.     Nose: Nose normal. No congestion.     Mouth/Throat:     Mouth: Mucous membranes are moist.     Pharynx: Oropharynx is clear. No posterior oropharyngeal erythema.  Eyes:     General: No scleral icterus.       Right eye: No discharge.        Left eye: No discharge.     Conjunctiva/sclera: Conjunctivae normal.     Pupils: Pupils are equal, round, and reactive to  light.  Neck:     Thyroid: No thyromegaly.     Vascular: No carotid bruit or JVD.  Cardiovascular:     Rate and Rhythm: Normal rate and regular rhythm.     Pulses: Normal pulses.     Heart sounds: Normal heart sounds.     No gallop.  Pulmonary:     Effort: Pulmonary effort is normal. No respiratory distress.     Breath sounds: Normal breath sounds. No wheezing or rales.     Comments: Good air exch Chest:     Chest wall: No tenderness.  Abdominal:     General: Bowel sounds are normal. There is no distension or abdominal bruit.     Palpations: Abdomen is soft. There is no mass.     Tenderness: There is no abdominal tenderness.     Hernia: No hernia is present.  Musculoskeletal:        General: No tenderness.     Cervical back: Normal range of motion and neck supple. No rigidity. No muscular tenderness.     Right lower leg: No edema.     Left lower leg: No edema.     Comments: Limited rom r knee    Lymphadenopathy:     Cervical: No cervical adenopathy.  Skin:    General: Skin is warm and dry.     Coloration: Skin is not pale.     Findings: No erythema or rash.     Comments: Solar lentigines diffusely   Neurological:     Mental Status: He is alert.     Cranial Nerves: No cranial nerve deficit.     Motor: No abnormal muscle tone.     Coordination: Coordination normal.     Gait: Gait normal.     Deep Tendon Reflexes: Reflexes are normal and symmetric. Reflexes normal.  Psychiatric:        Mood and Affect: Mood normal.        Cognition and Memory: Cognition and memory normal.           Assessment & Plan:   Problem List Items Addressed This Visit       Respiratory   OSA (obstructive sleep apnea)    Continues cpap use         Endocrine   Hypothyroidism    Hypothyroidism  Pt has no clinical changes No change in energy level/ hair or skin/ edema and no tremor Lab Results  Component Value Date   TSH 2.30 05/01/2023  Plan to continue levothyroxine 75 mcg  once  Proliferative diabetic retinopathy of left eye determined by examination [facility])    [facility] eye [facility]  Dm is better controlled       Type 2 diabetes mellitus with ophthalmic complication [facility])    Seeing endocrinology  Per pt last A1c 6.9  Mounjaro has helped weight loss  Continues jardiance, lantus, humalog  Stopped metformin          Nervous and Auditory   Peripheral neuropathy    Gabapentin has helped discomfort (not numbness) Wants to continue it  If pain returns can consider increase dose         Other   Alcohol consumption heavy    Less than he used to drink but still binges when not working Discussed risks of etoh (including effect on dm)  Encouraged to cut back to 2 or less drinks per day   LFTs are in normal range       Current use of proton pump inhibitor    Lab Results  Component Value Date   VITAMINB12 472 05/01/2023   Last vitamin D Lab Results  Component Value Date   VD25OH 39.16 05/01/2023   Will continue to monitor       Low vitamin D level    Last vitamin D Lab Results  Component Value Date   VD25OH 39.16 05/01/2023   Vitamin D level is therapeutic with current supplementation Disc importance of this to bone and overall health       Obesity (BMI 30-39.9)    With co morbidities Lost over 50 lb with mounjaro for dm  Discussed how this problem influences overall health and the risks it imposes  Reviewed plan for weight loss with lower calorie diet (via better food choices (lower glycemic and portion control) along with exercise building up to or more than 30 minutes 5 days per week including some aerobic activity and strength training         Prostate cancer screening    Lab Results  Component Value Date   PSA 0.61 05/01/2023   PSA 0.66 10/27/2021   PSA 0.60 09/02/2020    No change in voiding  Nocturia 0-1  Brother has recent elevated psa and is being worked up for that currently       Routine general medical  examination at a health care facility - Primary    Reviewed health habits including diet and exercise and skin cancer prevention Reviewed appropriate screening tests for age  Also reviewed health mt list, fam hx and immunization status , as well as social and family history   See HPI Labs reviewed and ordered Sent for shingrix vaccine dates Utd flu shot  [facility] eye care  Psa stable  Colonoscopy 11/2021- suspect 5 y recall for fam history  Discussed fall prevention, supplements and exercise for bone density  (D level is normal now) Encouraged less etoh intake  PHQ 3 - due to fatigue

## 2023-05-08 NOTE — Assessment & Plan Note (Signed)
Hypothyroidism  Pt has no clinical changes No change in energy level/ hair or skin/ edema and no tremor Lab Results  Component Value Date   TSH 2.30 05/01/2023  Plan to continue levothyroxine 75 mcg once

## 2023-05-08 NOTE — Assessment & Plan Note (Signed)
Lab Results  Component Value Date   VITAMINB12 472 05/01/2023   Last vitamin D Lab Results  Component Value Date   VD25OH 39.16 05/01/2023   Will continue to monitor

## 2023-05-08 NOTE — Patient Instructions (Addendum)
Add some strength training to your routine, this is important for bone and brain health and can reduce your risk of falls and help your body use insulin properly and regulate weight  Light weights, exercise bands , and internet videos are a good way to start  Yoga (chair or regular), machines , floor exercises or a gym with machines are also good options   Try to get most of your carbohydrates from produce (with the exception of white potatoes) and whole grains Eat less bread/pasta/rice/snack foods/cereals/sweets and other items from the middle of the grocery store (processed carbs)  Take care of yourself   Try not to drink more than 2 alcoholic drinks per day

## 2023-05-09 ENCOUNTER — Other Ambulatory Visit: Payer: Self-pay | Admitting: Internal Medicine

## 2023-05-20 ENCOUNTER — Encounter: Payer: Self-pay | Admitting: Podiatry

## 2023-05-20 ENCOUNTER — Ambulatory Visit (INDEPENDENT_AMBULATORY_CARE_PROVIDER_SITE_OTHER): Payer: 59 | Admitting: Podiatry

## 2023-05-20 DIAGNOSIS — G6289 Other specified polyneuropathies: Secondary | ICD-10-CM

## 2023-05-20 DIAGNOSIS — M722 Plantar fascial fibromatosis: Secondary | ICD-10-CM | POA: Diagnosis not present

## 2023-05-20 MED ORDER — TRIAMCINOLONE ACETONIDE 10 MG/ML IJ SUSP
2.5000 mg | Freq: Once | INTRAMUSCULAR | Status: AC
Start: 2023-05-20 — End: 2023-05-20
  Administered 2023-05-20: 2.5 mg via INTRA_ARTICULAR

## 2023-05-20 MED ORDER — DEXAMETHASONE SODIUM PHOSPHATE 120 MG/30ML IJ SOLN
4.0000 mg | Freq: Once | INTRAMUSCULAR | Status: AC
Start: 2023-05-20 — End: 2023-05-20
  Administered 2023-05-20: 4 mg via INTRA_ARTICULAR

## 2023-05-20 NOTE — Progress Notes (Signed)
Subjective:  Patient ID: Mitchell Palmer, male    DOB: 1965/12/21,   MRN: [id number]  Chief Complaint  Patient presents with   Plantar Fasciitis    Pt presents for follow-up of plantar fasciitis on the right     57 y.o. male presents for follow-up of plantar fasciitis on the right and numbness in the feet. Relates injection did help last time and was about 90% better however it is coming back and starting to hurt again. Relates he has been occasionally stretching.   Denies any other pedal complaints. Denies n/v/f/c.   Past Medical History:  Diagnosis Date   Allergy    allegic rhinitis   Diabetes mellitus    type II   Esophageal stricture    GERD (gastroesophageal reflux disease)    History of hernia repair    as a baby   Hyperlipidemia    Hyperplastic polyp of intestine 2010   Hypertension    OSA (obstructive sleep apnea) 04/18/2022   Proliferative diabetic retinopathy of left eye (HCC) 03/31/2020   This is active as a patient still has tufts of the anterior segment neovascularization, rubeosis in the left eye every region of capillary nonperfusion should be treated with PRP.   S/P ear surgery, follow-up exam    for drainage   Vitreous hemorrhage of right eye (HCC) 12/24/2019    Objective:  Physical Exam: Vascular: DP/PT pulses 2/4 bilateral. CFT <3 seconds. Normal hair growth on digits. No edema.  Skin. No lacerations or abrasions bilateral feet.  Musculoskeletal: MMT 5/5 bilateral lower extremities in DF, PF, Inversion and Eversion. Deceased ROM in DF of ankle joint. Tender to medial calcaneal tubercle on the right . No pain along achilles PT or arch. Pes cavus noted on the right. Collapsed arch noted on the left. Negative tinels sign Neurological: Sensation intact to light touch.   Assessment:   1. Plantar fasciitis, right        Plan:  Patient was evaluated and treated and all questions answered. Discussed plantar fasciitis with patient. Discussed neruopathy and  radiculopathy as well.  Reviewed notes from PCP.  Continue gabapentin.  X-rays reviewed and discussed with patient. No acute fractures or dislocations noted. Mild spurring noted at inferior calcaneus.  Discussed treatment options including, ice, NSAIDS, supportive shoes, bracing, and stretching. Continue stretching and brace.  Amb ref to PT provided.  Injection provided today procedure below.  Follow-up 9 weeks or sooner if any problems arise. In the meantime, encouraged to call the office with any questions, concerns, change in symptoms.    Procedure: Injection Tendon/Ligament Discussed alternatives, risks, complications and verbal consent was obtained.  Location: Right plantar fascia . Skin Prep: Alcohol. Injectate: 1cc 0.5% marcaine plain, 1 cc dexamethasone 0.5 cc kenalog  Disposition: Patient tolerated procedure well. Injection site dressed with a band-aid.  Post-injection care was discussed and return precautions discussed.     Louann Sjogren, DPM

## 2023-05-28 ENCOUNTER — Encounter: Payer: Self-pay | Admitting: Internal Medicine

## 2023-05-29 ENCOUNTER — Other Ambulatory Visit: Payer: Self-pay | Admitting: Orthopedic Surgery

## 2023-05-29 ENCOUNTER — Telehealth: Payer: Self-pay

## 2023-05-29 DIAGNOSIS — M25561 Pain in right knee: Secondary | ICD-10-CM

## 2023-05-29 NOTE — Telephone Encounter (Signed)
Sample  Device: FreeStyle Libre 3 plus  Quantity:1 MWN:[id number] EXP:10/18/2023  Dicie Beam

## 2023-05-31 NOTE — Telephone Encounter (Signed)
Patient picked up Free Style Libre 3 Plus sample

## 2023-06-02 ENCOUNTER — Ambulatory Visit
Admission: RE | Admit: 2023-06-02 | Discharge: 2023-06-02 | Disposition: A | Discharge status: Home / Self Care | Payer: 59 | Source: Ambulatory Visit | Attending: Orthopedic Surgery | Admitting: Orthopedic Surgery

## 2023-06-02 DIAGNOSIS — M25561 Pain in right knee: Secondary | ICD-10-CM

## 2023-06-03 ENCOUNTER — Ambulatory Visit: Payer: 59 | Attending: Podiatry | Admitting: Physical Therapy

## 2023-06-03 ENCOUNTER — Other Ambulatory Visit: Payer: Self-pay

## 2023-06-03 ENCOUNTER — Other Ambulatory Visit: Payer: Self-pay | Admitting: Family Medicine

## 2023-06-03 ENCOUNTER — Encounter: Payer: Self-pay | Admitting: Physical Therapy

## 2023-06-03 DIAGNOSIS — M722 Plantar fascial fibromatosis: Secondary | ICD-10-CM | POA: Insufficient documentation

## 2023-06-03 DIAGNOSIS — R2689 Other abnormalities of gait and mobility: Secondary | ICD-10-CM | POA: Insufficient documentation

## 2023-06-03 DIAGNOSIS — M25571 Pain in right ankle and joints of right foot: Secondary | ICD-10-CM | POA: Diagnosis present

## 2023-06-03 DIAGNOSIS — M6281 Muscle weakness (generalized): Secondary | ICD-10-CM | POA: Insufficient documentation

## 2023-06-03 MED ORDER — GABAPENTIN 100 MG PO CAPS: 100.0000 mg | ORAL_CAPSULE | Freq: Two times a day (BID) | ORAL | 1 refills | Status: DC

## 2023-06-03 NOTE — Telephone Encounter (Signed)
Gabapentin last filled on 04/26/23 #60 tabs/ 0 refill  CPE scheduled on 05/08/23

## 2023-06-03 NOTE — Therapy (Signed)
OUTPATIENT PHYSICAL THERAPY LOWER EXTREMITY EVALUATION   Patient Name: Mitchell Palmer MRN: [id number] DOB:July 03, 1966, 57 y.o., male Today's Date: 06/03/2023  END OF SESSION:  PT End of Session - 06/03/23 1401     Visit Number 1    Number of Visits 17    Date for PT Re-Evaluation 07/29/23    Authorization Type [employer]    PT Start Time 1401    PT Stop Time 1445    PT Time Calculation (min) 44 min    Activity Tolerance Patient tolerated treatment well    Behavior During Therapy WFL for tasks assessed/performed             Past Medical History:  Diagnosis Date   Allergy    allegic rhinitis   Diabetes mellitus    type II   Esophageal stricture    GERD (gastroesophageal reflux disease)    History of hernia repair    as a baby   Hyperlipidemia    Hyperplastic polyp of intestine 2010   Hypertension    OSA (obstructive sleep apnea) 04/18/2022   Proliferative diabetic retinopathy of left eye (HCC) 03/31/2020   This is active as a patient still has tufts of the anterior segment neovascularization, rubeosis in the left eye every region of capillary nonperfusion should be treated with PRP.   S/P ear surgery, follow-up exam    for drainage   Vitreous hemorrhage of right eye (HCC) 12/24/2019   Past Surgical History:  Procedure Laterality Date   BIOPSY  12/07/2021   Procedure: BIOPSY;  Surgeon: Meryl Dare, MD;  Location: [facility];  Service: Gastroenterology;;   CATARACT EXTRACTION     COLONOSCOPY  03/07/2016   COLONOSCOPY WITH PROPOFOL N/A 12/07/2021   Procedure: COLONOSCOPY WITH PROPOFOL;  Surgeon: Meryl Dare, MD;  Location: [facility];  Service: Gastroenterology;  Laterality: N/A;   ESOPHAGOGASTRODUODENOSCOPY  04/20/2009   stricture/GERD   ESOPHAGOGASTRODUODENOSCOPY (EGD) WITH PROPOFOL N/A 12/07/2021   Procedure: ESOPHAGOGASTRODUODENOSCOPY (EGD) WITH PROPOFOL;  Surgeon: Meryl Dare, MD;  Location: [facility];  Service: Gastroenterology;  Laterality: N/A;    EYE SURGERY     HERNIA REPAIR     age 69-midline   INNER EAR SURGERY  08/20/1986   went in behind rt ear-blockage   KNEE ARTHROSCOPY Right 2023   NECK SURGERY     [facility]   POLYPECTOMY     SAVORY DILATION N/A 12/07/2021   Procedure: SAVORY DILATION;  Surgeon: Meryl Dare, MD;  Location: [facility];  Service: Gastroenterology;  Laterality: N/A;   SHOULDER ARTHROSCOPY WITH ROTATOR CUFF REPAIR AND SUBACROMIAL DECOMPRESSION Right 09/29/2012   Procedure: SHOULDER ARTHROSCOPY WITH ROTATOR CUFF REPAIR AND SUBACROMIAL DECOMPRESSION;  Surgeon: Mable Paris, MD;  Location: [facility];  Service: Orthopedics;  Laterality: Right;  Right shoulder arthroscopy with subacromial decompression and distal clavicle excision & Debridement of Labrial Tear   SHOULDER SURGERY Left    x2   UPPER GASTROINTESTINAL ENDOSCOPY     Patient Active Problem List   Diagnosis Date Noted   Peripheral neuropathy 02/08/2023   Heel pain, bilateral 02/08/2023   Dyspnea on exertion 07/30/2022   OSA (obstructive sleep apnea) 04/18/2022   Loud snoring 12/12/2021   Family history of colon cancer    Lump on finger 11/06/2021   Pseudophakia, left eye 10/26/2021   Pseudophakia, right eye 10/26/2021   Vitreomacular adhesion of left eye 09/21/2021   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/2021   Vitreous hemorrhage, left eye (HCC) 06/07/2021  Proliferative diabetic retinopathy of left eye with macular edema associated with type 2 diabetes mellitus (HCC) 06/07/2021   Localized swelling, mass and lump, upper limb 05/31/2021   Low vitamin D level 09/14/2020   Prostate cancer screening 09/02/2020   Current use of proton pump inhibitor 09/02/2020   Family history of factor V Leiden mutation 05/30/2020   Left ankle pain 01/29/2020   Obesity (BMI 30-39.9) 12/24/2019   Proliferative diabetic retinopathy of left eye determined by examination (HCC) 12/24/2019   Nuclear sclerotic cataract of left eye  12/24/2019   Low testosterone 11/13/2019   Fatigue 10/20/2019   Neck pain 04/28/2018   Need for hepatitis B screening test 03/26/2018   Need for hepatitis C screening test 03/26/2018   Paresthesia of both feet 11/01/2017   Hemorrhoids 10/14/2017   Class 2 obesity due to excess calories with body mass index (BMI) of 35.0 to 35.9 in adult 01/24/2016   Hypothyroidism 01/24/2016   Type 2 diabetes mellitus with ophthalmic complication (HCC) 07/28/2015   Alcohol consumption heavy 12/06/2014   Routine general medical examination at a health care facility 09/30/2013   Low back pain 06/17/2012   PLANTAR FASCIITIS, TRAUMATIC 11/09/2009   NEOPLASM UNCERTAIN BEHAVIOR OTHER SPEC SITES 10/07/2009   ESOPHAGEAL STRICTURE 06/27/2009   GERD 03/31/2009   Hyperlipidemia associated with type 2 diabetes mellitus (HCC) 01/06/2009   Essential hypertension 01/06/2009   Allergic rhinitis 01/06/2009    PCP: Judy Pimple, MD  REFERRING PROVIDER: Louann Sjogren, DPM   REFERRING DIAG: Plantar fasciitis, right [M72.2]   THERAPY DIAG:  Pain in right ankle and joints of right foot  Muscle weakness (generalized)  Other abnormalities of gait and mobility  Rationale for Evaluation and Treatment: Rehabilitation  ONSET DATE: 6 months   SUBJECTIVE:   SUBJECTIVE STATEMENT: Heel problem started 6 months ago with no specific onset. The pain in the heel which he has had 2 injections in the heel which makes if feel good but does wear off fairly quickly. Pain is currently a 3/10 that is worse in the morning rated at a 10/10 located in his heel. He reports of hx. Hx of tearing the L PF over 30 years ago.   PERTINENT HISTORY: See P PAIN:  Are you having pain? Yes: NPRS scale: 3/10, at worst 10/10 Pain location: R heel  Pain description: sharpness Aggravating factors: getting out of bed in the morning Relieving factors: ankle brace, stretch   PRECAUTIONS: None  RED FLAGS: None   WEIGHT BEARING  RESTRICTIONS: No  FALLS:  Has patient fallen in last 6 months? No  LIVING ENVIRONMENT: Lives with: lives with their family Lives in: House/apartment Stairs: Yes: External: 3 steps; none Has following equipment at home:  Brace  OCCUPATION: [place] [employer] [place],  PLOF: Independent with basic ADLs  PATIENT GOALS: stretching, relieving pain.    OBJECTIVE:  Note: Objective measures were completed at Evaluation unless otherwise noted.  DIAGNOSTIC FINDINGS:at MD's office.   PATIENT SURVEYS:  FOTO 65%  predicted 70%  COGNITION: Overall cognitive status: Within functional limits for tasks assessed     SENSATION: WFL  POSTURE: rounded shoulders and forward head  PALPATION: TTP along the  bil with R>L calf/ gastroc and posterior tibialis and at the R medial calcaneal tubercle.   LOWER EXTREMITY ROM:  Active ROM Right eval Left eval  Hip flexion    Hip extension    Hip abduction    Hip adduction    Hip internal rotation    Hip  external rotation    Knee flexion    Knee extension    Ankle dorsiflexion    Ankle plantarflexion    Ankle inversion    Ankle eversion     (Blank rows = not tested)  LOWER EXTREMITY MMT:  MMT Right eval Left eval  Hip flexion    Hip extension    Hip abduction    Hip adduction    Hip internal rotation    Hip external rotation    Knee flexion    Knee extension    Ankle dorsiflexion 4+ P! 5  Ankle plantarflexion 5 5  Ankle inversion 4+ P! 5  Ankle eversion 3+  5   (Blank rows = not tested)   GAIT: Distance walked: 120 from waiting room to tx area Assistive device utilized: None Level of assistance: Complete Independence Comments: antalgic gait pattern with limited heel strike on the R compared bil   TODAY'S TREATMENT:                                                                                                                              [facility] Adult PT Treatment:                                                DATE:  06/03/2023 Therapeutic Exercise: Seated gastroc stretch with strap 2 x 30 sec Standing gastroc/ soleus stretch against wall 1 x 30 sec ea. Ankle DF/ eversion/ inversion 1 x 10 with RTB Seated heel raise with pushing through great toe at end range 1 x 10  Provided initial HEP Manual Therapy: MTPR along the gastroc/ soleus and posterior tib bil Neuromuscular re-ed: Gait training heel strike/ toe off taking smaller stride utilizing natural rocker to assist with efficient gait pattern.   PATIENT EDUCATION:  Education details: Evaluation findings, POC, goals, HEP with proper form/ rationale. Person educated: Patient Education method: Explanation, Demonstration, Verbal cues, and Handouts Education comprehension: verbalized understanding  HOME EXERCISE PROGRAM: Access Code: [email or web address] URL: [email or web address] Date: 06/03/2023 Prepared by: [name]  Exercises - Gastroc Stretch with Foot at Wall  - 3 x daily - 7 x weekly - 1-2 sets - 1-2 reps - 30-60 seconds hold - Soleus Stretch with Foot at Wall  - 3 x daily - 7 x weekly - 1 -2 sets - 1 - 2 reps - 30- 60 seconds hold - Seated Ankle Eversion with Resistance  - 1 x daily - 7 x weekly - 2 sets - 10 reps - Seated Calf Stretch with Strap  - 1 x daily - 7 x weekly - 1 - 2 sets - 2 reps - 30 - 60 seconds hold - Seated Ankle Dorsiflexion with Resistance  - 1 x daily - 7 x weekly - 2 sets - 10 reps - Seated Ankle Inversion with  Resistance  - 1 x daily - 7 x weekly - 2 sets - 10 reps - Seated Heel Raise  - 1 x daily - 7 x weekly - 2 sets - 15 reps  ASSESSMENT:  CLINICAL IMPRESSION: Patient is a 57 y.o. M who was seen today for physical therapy evaluation and treatment for dx of R Plantar fasciitis. Pt presents with R heel pain that started insidisously 6 months ago.  He has functional ankle ROM compared bil with weakness in R ankle DF/ inversion/ eversion with pain during assessment. TTP along bil gastroc/ soleus and  posterior tib with R worse than left and specific point tenderness along the medial calcaneal tubercle. Patient responded well in session with MTPR along the R calf and posterior tib, followed with stretching calf strengthening. He noted relief in the calf and in the heel end of the session. Pt would benefit from physical therapy to decrease R heel pain, reduce calf tightness, improve gross LE strength, and maximize gait efficiency and overall function by addressing the deficits listed.   OBJECTIVE IMPAIRMENTS: Abnormal gait, decreased activity tolerance, decreased endurance, decreased strength, increased fascial restrictions, increased muscle spasms, improper body mechanics, postural dysfunction, and pain.   ACTIVITY LIMITATIONS: lifting, standing, squatting, and locomotion level  PARTICIPATION LIMITATIONS: community activity, occupation, and yard work  PERSONAL FACTORS: Age, Past/current experiences, and 1-2 comorbidities: hx of L PF tear, R knee surgery  are also affecting patient's functional outcome.   REHAB POTENTIAL: Good  CLINICAL DECISION MAKING: Evolving/moderate complexity  EVALUATION COMPLEXITY: Moderate   GOALS: Goals reviewed with patient? Yes  SHORT TERM GOALS: Target date: 07/01/2023   PT to be IND with initial HEP for therapeutic progression Baseline: Goal status: INITIAL  2.  Pt to report pain to </= 7/10 max with getting out of bed in the AM for improvement in condition Baseline:  Goal status: INITIAL  3.  Pt to verbalize/ demo efficient gait pattern utilizing heel strike/ toe off with </= 5/10 max pain noted during gait. Baseline:  Goal status: INITIAL   LONG TERM GOALS: Target date: 07/29/2023  Increase R calf gross strength to >/= 4+/5 to promote ankle strength/ stability with walking/ standing Baseline:  Goal status: INITIAL  2.  Pt to report max pain in the morning to </= 3/10 and during the day to </=1/10 pain for improvement in condition.  Baseline:   Goal status: INITIAL  3.  Improve FOTO score to >/= 70% to demo improvement in function Baseline:  Goal status: INITIAL  4.  Pt to be able to sit/ stand and walk  and perform work related activities without limitation. Baseline:  Goal status: INITIAL  5.  Pt to be IND with all HEP and will is able to maintain and progress their current level of function IND. Baseline:  Goal status: INITIAL   PLAN:  PT FREQUENCY: 1-2x/week  PT DURATION: 8 weeks  PLANNED INTERVENTIONS: 97110-Therapeutic exercises, 97530- Therapeutic activity, [id number]- Neuromuscular re-education, 97535- Self Care, [id number]- Manual therapy, [id number]- Gait training, [id number]- Ultrasound, [id number]- Ionotophoresis 4mg /ml Dexamethasone, Taping, Dry Needling, Joint mobilization, Joint manipulation, Cryotherapy, and Moist heat  PLAN FOR NEXT SESSION: Review/ update HEP PRN. STW along the R gastroc/ soleus, consider DN if pt is up for it. Foot intrinsic strengthening, ankle extrinsic strengtheneing, calf stretching.    Marga Gramajo PT, DPT, LAT, ATC  06/03/23  3:29 PM

## 2023-06-05 ENCOUNTER — Telehealth: Payer: Self-pay

## 2023-06-05 DIAGNOSIS — E11319 Type 2 diabetes mellitus with unspecified diabetic retinopathy without macular edema: Secondary | ICD-10-CM

## 2023-06-05 MED ORDER — FREESTYLE LIBRE 3 SENSOR MISC
1.0000 | 0 refills | Status: DC
Start: 2023-06-05 — End: 2023-07-22

## 2023-06-05 NOTE — Telephone Encounter (Signed)
Patient called and left a message stating that he needed 1 sensor sent in to [facility].  Rx sent.   Requested Prescriptions   Signed Prescriptions Disp Refills   Continuous Glucose Sensor (FREESTYLE LIBRE 3 SENSOR) MISC 1 each 0    Sig: 1 applicator by Does not apply route every 14 (fourteen) days.    Authorizing Provider: Carlus Pavlov    Ordering User: Pollie Meyer

## 2023-06-10 ENCOUNTER — Ambulatory Visit: Payer: 59 | Admitting: Physical Therapy

## 2023-06-10 DIAGNOSIS — M25571 Pain in right ankle and joints of right foot: Secondary | ICD-10-CM | POA: Diagnosis not present

## 2023-06-10 DIAGNOSIS — M6281 Muscle weakness (generalized): Secondary | ICD-10-CM

## 2023-06-10 DIAGNOSIS — R2689 Other abnormalities of gait and mobility: Secondary | ICD-10-CM

## 2023-06-10 NOTE — Therapy (Signed)
OUTPATIENT PHYSICAL THERAPY TREATMENT   Patient Name: Mitchell Palmer MRN: [id number] DOB:1965-08-21, 57 y.o., male Today's Date: 06/10/2023  END OF SESSION:  PT End of Session - 06/10/23 1509     Visit Number 2    Number of Visits 17    Date for PT Re-Evaluation 07/29/23    Authorization Type [employer]    PT Start Time 1415    PT Stop Time 1505    PT Time Calculation (min) 50 min    Activity Tolerance Patient tolerated treatment well    Behavior During Therapy WFL for tasks assessed/performed              Past Medical History:  Diagnosis Date   Allergy    allegic rhinitis   Diabetes mellitus    type II   Esophageal stricture    GERD (gastroesophageal reflux disease)    History of hernia repair    as a baby   Hyperlipidemia    Hyperplastic polyp of intestine 2010   Hypertension    OSA (obstructive sleep apnea) 04/18/2022   Proliferative diabetic retinopathy of left eye (HCC) 03/31/2020   This is active as a patient still has tufts of the anterior segment neovascularization, rubeosis in the left eye every region of capillary nonperfusion should be treated with PRP.   S/P ear surgery, follow-up exam    for drainage   Vitreous hemorrhage of right eye (HCC) 12/24/2019   Past Surgical History:  Procedure Laterality Date   BIOPSY  12/07/2021   Procedure: BIOPSY;  Surgeon: Meryl Dare, MD;  Location: [facility];  Service: Gastroenterology;;   CATARACT EXTRACTION     COLONOSCOPY  03/07/2016   COLONOSCOPY WITH PROPOFOL N/A 12/07/2021   Procedure: COLONOSCOPY WITH PROPOFOL;  Surgeon: Meryl Dare, MD;  Location: [facility];  Service: Gastroenterology;  Laterality: N/A;   ESOPHAGOGASTRODUODENOSCOPY  04/20/2009   stricture/GERD   ESOPHAGOGASTRODUODENOSCOPY (EGD) WITH PROPOFOL N/A 12/07/2021   Procedure: ESOPHAGOGASTRODUODENOSCOPY (EGD) WITH PROPOFOL;  Surgeon: Meryl Dare, MD;  Location: [facility];  Service: Gastroenterology;  Laterality: N/A;   EYE SURGERY      HERNIA REPAIR     age 25-midline   INNER EAR SURGERY  08/20/1986   went in behind rt ear-blockage   KNEE ARTHROSCOPY Right 2023   NECK SURGERY     [facility]   POLYPECTOMY     SAVORY DILATION N/A 12/07/2021   Procedure: SAVORY DILATION;  Surgeon: Meryl Dare, MD;  Location: [facility];  Service: Gastroenterology;  Laterality: N/A;   SHOULDER ARTHROSCOPY WITH ROTATOR CUFF REPAIR AND SUBACROMIAL DECOMPRESSION Right 09/29/2012   Procedure: SHOULDER ARTHROSCOPY WITH ROTATOR CUFF REPAIR AND SUBACROMIAL DECOMPRESSION;  Surgeon: Mable Paris, MD;  Location: [facility];  Service: Orthopedics;  Laterality: Right;  Right shoulder arthroscopy with subacromial decompression and distal clavicle excision & Debridement of Labrial Tear   SHOULDER SURGERY Left    x2   UPPER GASTROINTESTINAL ENDOSCOPY     Patient Active Problem List   Diagnosis Date Noted   Peripheral neuropathy 02/08/2023   Heel pain, bilateral 02/08/2023   Dyspnea on exertion 07/30/2022   OSA (obstructive sleep apnea) 04/18/2022   Loud snoring 12/12/2021   Family history of colon cancer    Lump on finger 11/06/2021   Pseudophakia, left eye 10/26/2021   Pseudophakia, right eye 10/26/2021   Vitreomacular adhesion of left eye 09/21/2021   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/2021   Vitreous hemorrhage, left eye (HCC) 06/07/2021  Proliferative diabetic retinopathy of left eye with macular edema associated with type 2 diabetes mellitus (HCC) 06/07/2021   Localized swelling, mass and lump, upper limb 05/31/2021   Low vitamin D level 09/14/2020   Prostate cancer screening 09/02/2020   Current use of proton pump inhibitor 09/02/2020   Family history of factor V Leiden mutation 05/30/2020   Left ankle pain 01/29/2020   Obesity (BMI 30-39.9) 12/24/2019   Proliferative diabetic retinopathy of left eye determined by examination (HCC) 12/24/2019   Nuclear sclerotic cataract of left eye 12/24/2019    Low testosterone 11/13/2019   Fatigue 10/20/2019   Neck pain 04/28/2018   Need for hepatitis B screening test 03/26/2018   Need for hepatitis C screening test 03/26/2018   Paresthesia of both feet 11/01/2017   Hemorrhoids 10/14/2017   Class 2 obesity due to excess calories with body mass index (BMI) of 35.0 to 35.9 in adult 01/24/2016   Hypothyroidism 01/24/2016   Type 2 diabetes mellitus with ophthalmic complication (HCC) 07/28/2015   Alcohol consumption heavy 12/06/2014   Routine general medical examination at a health care facility 09/30/2013   Low back pain 06/17/2012   PLANTAR FASCIITIS, TRAUMATIC 11/09/2009   NEOPLASM UNCERTAIN BEHAVIOR OTHER SPEC SITES 10/07/2009   ESOPHAGEAL STRICTURE 06/27/2009   GERD 03/31/2009   Hyperlipidemia associated with type 2 diabetes mellitus (HCC) 01/06/2009   Essential hypertension 01/06/2009   Allergic rhinitis 01/06/2009    PCP: Judy Pimple, MD  REFERRING PROVIDER: Louann Sjogren, DPM   REFERRING DIAG: Plantar fasciitis, right [M72.2]   THERAPY DIAG:  Pain in right ankle and joints of right foot  Muscle weakness (generalized)  Other abnormalities of gait and mobility  Rationale for Evaluation and Treatment: Rehabilitation  ONSET DATE: 6 months   SUBJECTIVE:   SUBJECTIVE STATEMENT: "I've been stretching, felt good after the last session which lasted for several hours."  PERTINENT HISTORY: See PMHx PAIN:  Are you having pain? Yes: NPRS scale: 4/10, this AM 9/10 Pain location: R heel  Pain description: sharpness Aggravating factors: getting out of bed in the morning Relieving factors: ankle brace, stretch   PRECAUTIONS: None  RED FLAGS: None   WEIGHT BEARING RESTRICTIONS: No  FALLS:  Has patient fallen in last 6 months? No  LIVING ENVIRONMENT: Lives with: lives with their family Lives in: House/apartment Stairs: Yes: External: 3 steps; none Has following equipment at home:  Brace  OCCUPATION: city of  [place],  PLOF: Independent with basic ADLs  PATIENT GOALS: stretching, relieving pain.    OBJECTIVE:  Note: Objective measures were completed at Evaluation unless otherwise noted.  DIAGNOSTIC FINDINGS:at MD's office.   PATIENT SURVEYS:  FOTO 65%  predicted 70%  COGNITION: Overall cognitive status: Within functional limits for tasks assessed     SENSATION: WFL  POSTURE: rounded shoulders and forward head  PALPATION: TTP along the  bil with R>L calf/ gastroc and posterior tibialis and at the R medial calcaneal tubercle.   LOWER EXTREMITY ROM:  Active ROM Right eval Left eval  Hip flexion    Hip extension    Hip abduction    Hip adduction    Hip internal rotation    Hip external rotation    Knee flexion    Knee extension    Ankle dorsiflexion    Ankle plantarflexion    Ankle inversion    Ankle eversion     (Blank rows = not tested)  LOWER EXTREMITY MMT:  MMT Right eval Left eval  Hip flexion  Hip extension    Hip abduction    Hip adduction    Hip internal rotation    Hip external rotation    Knee flexion    Knee extension    Ankle dorsiflexion 4+ P! 5  Ankle plantarflexion 5 5  Ankle inversion 4+ P! 5  Ankle eversion 3+  5   (Blank rows = not tested)   GAIT: Distance walked: 120 from waiting room to tx area Assistive device utilized: None Level of assistance: Complete Independence Comments: antalgic gait pattern with limited heel strike on the R compared bil   TODAY'S TREATMENT:                                                                                                                               [facility] Adult PT Treatment:                                                DATE: 06/10/2023 Therapeutic Exercise: Seated calf stretch with strap 3 x 30 sec bil Seated heel raise with met heads on rolled towel 1 x 12, progressed to standing 2 x 12 Towel scrunch 2 x 15 Manual Therapy: MTPR along bil gastroc/ soleus, and R quadratus  plantae IASTM techniques along bil gastroc/ soleus IASTM turning / stretching technique focused on medial calcaneal tubercle Active release on the R    [facility] Adult PT Treatment:                                                DATE: 06/03/2023 Therapeutic Exercise: Seated gastroc stretch with strap 2 x 30 sec Standing gastroc/ soleus stretch against wall 1 x 30 sec ea. Ankle DF/ eversion/ inversion 1 x 10 with RTB Seated heel raise with pushing through great toe at end range 1 x 10  Provided initial HEP Manual Therapy: MTPR along the gastroc/ soleus and posterior tib bil Neuromuscular re-ed: Gait training heel strike/ toe off taking smaller stride utilizing natural rocker to assist with efficient gait pattern.   PATIENT EDUCATION:  Education details: Evaluation findings, POC, goals, HEP with proper form/ rationale. Person educated: Patient Education method: Explanation, Demonstration, Verbal cues, and Handouts Education comprehension: verbalized understanding  HOME EXERCISE PROGRAM: Access Code: [email or web address] URL: [email or web address] Date: 06/03/2023 Prepared by: [name]  Exercises - Gastroc Stretch with Foot at Wall  - 3 x daily - 7 x weekly - 1-2 sets - 1-2 reps - 30-60 seconds hold - Soleus Stretch with Foot at Wall  - 3 x daily - 7 x weekly - 1 -2 sets - 1 - 2 reps - 30- 60 seconds hold - Seated Ankle Eversion with Resistance  - 1  x daily - 7 x weekly - 2 sets - 10 reps - Seated Calf Stretch with Strap  - 1 x daily - 7 x weekly - 1 - 2 sets - 2 reps - 30 - 60 seconds hold - Seated Ankle Dorsiflexion with Resistance  - 1 x daily - 7 x weekly - 2 sets - 10 reps - Seated Ankle Inversion with Resistance  - 1 x daily - 7 x weekly - 2 sets - 10 reps - Seated Heel Raise  - 1 x daily - 7 x weekly - 2 sets - 15 reps  ASSESSMENT:  CLINICAL IMPRESSION: 10/21/2024David reports consistency with his HEP and notes that he is doing better with stretching which has  helped more in the morning but he continues to have elevated pain. Continued working on STW along bil gastroc/ soleus and R medical calcaneal tubercle. Continue stretching and working on intrinsic strengthening. Pt reports having N/T in both ankles/ feet noted along the stocking distrubution which he notes fluctuating severity which could be potentially peripheral nerve involvement especially taking into account  his hx of DM and would benefit from further assessment from a neurologist.     ASSESSMENT AT EVALUATION: Patient is a 57 y.o. M who was seen today for physical therapy evaluation and treatment for dx of R Plantar fasciitis. Pt presents with R heel pain that started insidisously 6 months ago.  He has functional ankle ROM compared bil with weakness in R ankle DF/ inversion/ eversion with pain during assessment. TTP along bil gastroc/ soleus and posterior tib with R worse than left and specific point tenderness along the medial calcaneal tubercle. Patient responded well in session with MTPR along the R calf and posterior tib, followed with stretching calf strengthening. He noted relief in the calf and in the heel end of the session. Pt would benefit from physical therapy to decrease R heel pain, reduce calf tightness, improve gross LE strength, and maximize gait efficiency and overall function by addressing the deficits listed.   OBJECTIVE IMPAIRMENTS: Abnormal gait, decreased activity tolerance, decreased endurance, decreased strength, increased fascial restrictions, increased muscle spasms, improper body mechanics, postural dysfunction, and pain.   ACTIVITY LIMITATIONS: lifting, standing, squatting, and locomotion level  PARTICIPATION LIMITATIONS: community activity, occupation, and yard work  PERSONAL FACTORS: Age, Past/current experiences, and 1-2 comorbidities: hx of L PF tear, R knee surgery  are also affecting patient's functional outcome.   REHAB POTENTIAL: Good  CLINICAL DECISION  MAKING: Evolving/moderate complexity  EVALUATION COMPLEXITY: Moderate   GOALS: Goals reviewed with patient? Yes  SHORT TERM GOALS: Target date: 07/01/2023   PT to be IND with initial HEP for therapeutic progression Baseline: Goal status: INITIAL  2.  Pt to report pain to </= 7/10 max with getting out of bed in the AM for improvement in condition Baseline:  Goal status: INITIAL  3.  Pt to verbalize/ demo efficient gait pattern utilizing heel strike/ toe off with </= 5/10 max pain noted during gait. Baseline:  Goal status: INITIAL   LONG TERM GOALS: Target date: 07/29/2023  Increase R calf gross strength to >/= 4+/5 to promote ankle strength/ stability with walking/ standing Baseline:  Goal status: INITIAL  2.  Pt to report max pain in the morning to </= 3/10 and during the day to </=1/10 pain for improvement in condition.  Baseline:  Goal status: INITIAL  3.  Improve FOTO score to >/= 70% to demo improvement in function Baseline:  Goal status: INITIAL  4.  Pt to be able to sit/ stand and walk  and perform work related activities without limitation. Baseline:  Goal status: INITIAL  5.  Pt to be IND with all HEP and will is able to maintain and progress their current level of function IND. Baseline:  Goal status: INITIAL   PLAN:  PT FREQUENCY: 1-2x/week  PT DURATION: 8 weeks  PLANNED INTERVENTIONS: 97110-Therapeutic exercises, 97530- Therapeutic activity, [id number]- Neuromuscular re-education, 97535- Self Care, 29528- Manual therapy, [id number]- Gait training, [id number]- Ultrasound, 41324- Ionotophoresis 4mg /ml Dexamethasone, Taping, Dry Needling, Joint mobilization, Joint manipulation, Cryotherapy, and Moist heat  PLAN FOR NEXT SESSION: Review/ update HEP PRN. STW along the R gastroc/ soleus, consider DN if pt is up for it. Foot intrinsic strengthening, ankle extrinsic strengtheneing, calf stretching.    Zachary Nole PT, DPT, LAT, ATC  06/10/23  3:09 PM

## 2023-06-12 ENCOUNTER — Ambulatory Visit: Payer: 59 | Admitting: Physical Therapy

## 2023-06-12 ENCOUNTER — Telehealth: Payer: Self-pay | Admitting: *Deleted

## 2023-06-12 ENCOUNTER — Telehealth: Payer: Self-pay | Admitting: Cardiology

## 2023-06-12 ENCOUNTER — Encounter: Payer: Self-pay | Admitting: Physical Therapy

## 2023-06-12 DIAGNOSIS — M6281 Muscle weakness (generalized): Secondary | ICD-10-CM

## 2023-06-12 DIAGNOSIS — M25571 Pain in right ankle and joints of right foot: Secondary | ICD-10-CM | POA: Diagnosis not present

## 2023-06-12 DIAGNOSIS — R2689 Other abnormalities of gait and mobility: Secondary | ICD-10-CM

## 2023-06-12 NOTE — Telephone Encounter (Signed)
Pre-operative Risk Assessment    Patient Name: Mitchell Palmer  DOB: 1966-04-07 MRN: [id number]      Request for Surgical Clearance    Procedure:   Right Knee Scope  Date of Surgery:  Clearance - 06/26/23                                    Surgeon:  Dr. Luiz Blare Surgeon's Group or Practice Name:  [facility] Phone number:  [phone] Fax number:  [phone]   Type of Clearance Requested:   - Medical  - Pharmacy:  Hold        Type of Anesthesia:  General    Additional requests/questions:   Caller Raynelle Fanning) stated patient will need medical and pharmacy clearance.  Signed, Annetta Maw   06/12/2023, 4:26 PM

## 2023-06-12 NOTE — Telephone Encounter (Signed)
Name: Mitchell Palmer  DOB: 07-24-1966  MRN: [id number]  Primary Cardiologist: Donato Schultz, MD   Preoperative team, please contact this patient and set up a phone call appointment for further preoperative risk assessment. Please obtain consent and complete medication review. Thank you for your help.  I confirm that guidance regarding antiplatelet and oral anticoagulation therapy has been completed and, if necessary, noted below.  Patient not currently taking antiplatelet therapy or anticoagulation.  I also confirmed the patient resides in the state of [place]. As per [facility] Medical Board telemedicine laws, the patient must reside in the state in which the provider is licensed.   Ronney Asters, NP 06/12/2023, 4:41 PM [facility]

## 2023-06-12 NOTE — Telephone Encounter (Signed)
Pt has been scheduled for tele pre op appt 06/24/23, this is our 1st available. Med rec and consent are done

## 2023-06-12 NOTE — Telephone Encounter (Signed)
Pt has been scheduled for tele pre op appt 06/24/23, this is our 1st available. Med rec and consent are done.     Patient Consent for Virtual Visit        [name] has provided verbal consent on 06/12/2023 for a virtual visit (video or telephone).   CONSENT FOR VIRTUAL VISIT FOR:  Mitchell Palmer  By participating in this virtual visit I agree to the following:  I hereby voluntarily request, consent and authorize [facility] and its employed or contracted physicians, physician assistants, nurse practitioners or other licensed health care professionals (the Practitioner), to provide me with telemedicine health care services (the "Services") as deemed necessary by the treating Practitioner. I acknowledge and consent to receive the Services by the Practitioner via telemedicine. I understand that the telemedicine visit will involve communicating with the Practitioner through live audiovisual communication technology and the disclosure of certain medical information by electronic transmission. I acknowledge that I have been given the opportunity to request an in-person assessment or other available alternative prior to the telemedicine visit and am voluntarily participating in the telemedicine visit.  I understand that I have the right to withhold or withdraw my consent to the use of telemedicine in the course of my care at any time, without affecting my right to future care or treatment, and that the Practitioner or I may terminate the telemedicine visit at any time. I understand that I have the right to inspect all information obtained and/or recorded in the course of the telemedicine visit and may receive copies of available information for a reasonable fee.  I understand that some of the potential risks of receiving the Services via telemedicine include:  Delay or interruption in medical evaluation due to technological equipment failure or disruption; Information transmitted may not be  sufficient (e.g. poor resolution of images) to allow for appropriate medical decision making by the Practitioner; and/or  In rare instances, security protocols could fail, causing a breach of personal health information.  Furthermore, I acknowledge that it is my responsibility to provide information about my medical history, conditions and care that is complete and accurate to the best of my ability. I acknowledge that Practitioner's advice, recommendations, and/or decision may be based on factors not within their control, such as incomplete or inaccurate data provided by me or distortions of diagnostic images or specimens that may result from electronic transmissions. I understand that the practice of medicine is not an exact science and that Practitioner makes no warranties or guarantees regarding treatment outcomes. I acknowledge that a copy of this consent can be made available to me via my patient portal [facility] MyChart), or I can request a printed copy by calling the office of [facility].    I understand that my insurance will be billed for this visit.   I have read or had this consent read to me. I understand the contents of this consent, which adequately explains the benefits and risks of the Services being provided via telemedicine.  I have been provided ample opportunity to ask questions regarding this consent and the Services and have had my questions answered to my satisfaction. I give my informed consent for the services to be provided through the use of telemedicine in my medical care

## 2023-06-12 NOTE — Therapy (Signed)
OUTPATIENT PHYSICAL THERAPY TREATMENT   Patient Name: Mitchell Palmer MRN: [id number] DOB:1965/08/31, 57 y.o., male Today's Date: 06/12/2023  END OF SESSION:  PT End of Session - 06/12/23 1443     Visit Number 3    Number of Visits 17    Date for PT Re-Evaluation 07/29/23    Authorization Type [employer]    PT Start Time 0245    PT Stop Time 0329    PT Time Calculation (min) 44 min              Past Medical History:  Diagnosis Date   Allergy    allegic rhinitis   Diabetes mellitus    type II   Esophageal stricture    GERD (gastroesophageal reflux disease)    History of hernia repair    as a baby   Hyperlipidemia    Hyperplastic polyp of intestine 2010   Hypertension    OSA (obstructive sleep apnea) 04/18/2022   Proliferative diabetic retinopathy of left eye (HCC) 03/31/2020   This is active as a patient still has tufts of the anterior segment neovascularization, rubeosis in the left eye every region of capillary nonperfusion should be treated with PRP.   S/P ear surgery, follow-up exam    for drainage   Vitreous hemorrhage of right eye (HCC) 12/24/2019   Past Surgical History:  Procedure Laterality Date   BIOPSY  12/07/2021   Procedure: BIOPSY;  Surgeon: Meryl Dare, MD;  Location: [facility];  Service: Gastroenterology;;   CATARACT EXTRACTION     COLONOSCOPY  03/07/2016   COLONOSCOPY WITH PROPOFOL N/A 12/07/2021   Procedure: COLONOSCOPY WITH PROPOFOL;  Surgeon: Meryl Dare, MD;  Location: [facility];  Service: Gastroenterology;  Laterality: N/A;   ESOPHAGOGASTRODUODENOSCOPY  04/20/2009   stricture/GERD   ESOPHAGOGASTRODUODENOSCOPY (EGD) WITH PROPOFOL N/A 12/07/2021   Procedure: ESOPHAGOGASTRODUODENOSCOPY (EGD) WITH PROPOFOL;  Surgeon: Meryl Dare, MD;  Location: [facility];  Service: Gastroenterology;  Laterality: N/A;   EYE SURGERY     HERNIA REPAIR     age 74-midline   INNER EAR SURGERY  08/20/1986   went in behind rt ear-blockage   KNEE  ARTHROSCOPY Right 2023   NECK SURGERY     [facility]   POLYPECTOMY     SAVORY DILATION N/A 12/07/2021   Procedure: SAVORY DILATION;  Surgeon: Meryl Dare, MD;  Location: [facility];  Service: Gastroenterology;  Laterality: N/A;   SHOULDER ARTHROSCOPY WITH ROTATOR CUFF REPAIR AND SUBACROMIAL DECOMPRESSION Right 09/29/2012   Procedure: SHOULDER ARTHROSCOPY WITH ROTATOR CUFF REPAIR AND SUBACROMIAL DECOMPRESSION;  Surgeon: Mable Paris, MD;  Location: [facility];  Service: Orthopedics;  Laterality: Right;  Right shoulder arthroscopy with subacromial decompression and distal clavicle excision & Debridement of Labrial Tear   SHOULDER SURGERY Left    x2   UPPER GASTROINTESTINAL ENDOSCOPY     Patient Active Problem List   Diagnosis Date Noted   Peripheral neuropathy 02/08/2023   Heel pain, bilateral 02/08/2023   Dyspnea on exertion 07/30/2022   OSA (obstructive sleep apnea) 04/18/2022   Loud snoring 12/12/2021   Family history of colon cancer    Lump on finger 11/06/2021   Pseudophakia, left eye 10/26/2021   Pseudophakia, right eye 10/26/2021   Vitreomacular adhesion of left eye 09/21/2021   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/2021   Vitreous hemorrhage, left eye (HCC) 06/07/2021   Proliferative diabetic retinopathy of left eye with macular edema associated with type 2 diabetes mellitus (HCC) 06/07/2021  Localized swelling, mass and lump, upper limb 05/31/2021   Low vitamin D level 09/14/2020   Prostate cancer screening 09/02/2020   Current use of proton pump inhibitor 09/02/2020   Family history of factor V Leiden mutation 05/30/2020   Left ankle pain 01/29/2020   Obesity (BMI 30-39.9) 12/24/2019   Proliferative diabetic retinopathy of left eye determined by examination (HCC) 12/24/2019   Nuclear sclerotic cataract of left eye 12/24/2019   Low testosterone 11/13/2019   Fatigue 10/20/2019   Neck pain 04/28/2018   Need for hepatitis B screening test  03/26/2018   Need for hepatitis C screening test 03/26/2018   Paresthesia of both feet 11/01/2017   Hemorrhoids 10/14/2017   Class 2 obesity due to excess calories with body mass index (BMI) of 35.0 to 35.9 in adult 01/24/2016   Hypothyroidism 01/24/2016   Type 2 diabetes mellitus with ophthalmic complication (HCC) 07/28/2015   Alcohol consumption heavy 12/06/2014   Routine general medical examination at a health care facility 09/30/2013   Low back pain 06/17/2012   PLANTAR FASCIITIS, TRAUMATIC 11/09/2009   NEOPLASM UNCERTAIN BEHAVIOR OTHER SPEC SITES 10/07/2009   ESOPHAGEAL STRICTURE 06/27/2009   GERD 03/31/2009   Hyperlipidemia associated with type 2 diabetes mellitus (HCC) 01/06/2009   Essential hypertension 01/06/2009   Allergic rhinitis 01/06/2009    PCP: Judy Pimple, MD  REFERRING PROVIDER: Louann Sjogren, DPM   REFERRING DIAG: Plantar fasciitis, right [M72.2]   THERAPY DIAG:  Pain in right ankle and joints of right foot  Muscle weakness (generalized)  Other abnormalities of gait and mobility  Rationale for Evaluation and Treatment: Rehabilitation  ONSET DATE: 6 months   SUBJECTIVE:   SUBJECTIVE STATEMENT: I walked a little more than I should have today, about 20 minutes.   PERTINENT HISTORY: See PMHx PAIN:  Are you having pain? Yes: NPRS scale: 5/10  Pain location: R heel  Pain description: sharpness Aggravating factors: getting out of bed in the morning Relieving factors: ankle brace, stretch   PRECAUTIONS: None  RED FLAGS: None   WEIGHT BEARING RESTRICTIONS: No  FALLS:  Has patient fallen in last 6 months? No  LIVING ENVIRONMENT: Lives with: lives with their family Lives in: House/apartment Stairs: Yes: External: 3 steps; none Has following equipment at home:  Brace  OCCUPATION: city [employer] [place],  PLOF: Independent with basic ADLs  PATIENT GOALS: stretching, relieving pain.    OBJECTIVE:  Note: Objective measures were completed  at Evaluation unless otherwise noted.  DIAGNOSTIC FINDINGS:at MD's office.   PATIENT SURVEYS:  FOTO 65%  predicted 70%  COGNITION: Overall cognitive status: Within functional limits for tasks assessed     SENSATION: WFL  POSTURE: rounded shoulders and forward head  PALPATION: TTP along the  bil with R>L calf/ gastroc and posterior tibialis and at the R medial calcaneal tubercle.   LOWER EXTREMITY ROM:  Active ROM Right eval Left eval  Hip flexion    Hip extension    Hip abduction    Hip adduction    Hip internal rotation    Hip external rotation    Knee flexion    Knee extension    Ankle dorsiflexion    Ankle plantarflexion    Ankle inversion    Ankle eversion     (Blank rows = not tested)  LOWER EXTREMITY MMT:  MMT Right eval Left eval  Hip flexion    Hip extension    Hip abduction    Hip adduction    Hip internal rotation  Hip external rotation    Knee flexion    Knee extension    Ankle dorsiflexion 4+ P! 5  Ankle plantarflexion 5 5  Ankle inversion 4+ P! 5  Ankle eversion 3+  5   (Blank rows = not tested)   GAIT: Distance walked: 120 from waiting room to tx area Assistive device utilized: None Level of assistance: Complete Independence Comments: antalgic gait pattern with limited heel strike on the R compared bil   TODAY'S TREATMENT:                                                                                                                               [facility] Adult PT Treatment:                                                DATE: 06/12/23 Therapeutic Exercise: Towel scrunch  Toe extensions  Toe Yoga  Seated heel raise  Standing gastroc stretch x 2 L, 15-20 sec Standing soleus stretch x 2 L, 15-20 sec Manual Therapy: STW along gastroc and soleus TPR medial Gastroc Soft tissue strumming to plantar fascia Passive Gastroc and soleus stretches     [facility] Adult PT Treatment:                                                DATE:  06/10/2023 Therapeutic Exercise: Seated calf stretch with strap 3 x 30 sec bil Seated heel raise with met heads on rolled towel 1 x 12, progressed to standing 2 x 12 Towel scrunch 2 x 15 Manual Therapy: MTPR along bil gastroc/ soleus, and R quadratus plantae IASTM techniques along bil gastroc/ soleus IASTM turning / stretching technique focused on medial calcaneal tubercle Active release on the R    [facility] Adult PT Treatment:                                                DATE: 06/03/2023 Therapeutic Exercise: Seated gastroc stretch with strap 2 x 30 sec Standing gastroc/ soleus stretch against wall 1 x 30 sec ea. Ankle DF/ eversion/ inversion 1 x 10 with RTB Seated heel raise with pushing through great toe at end range 1 x 10  Provided initial HEP Manual Therapy: MTPR along the gastroc/ soleus and posterior tib bil Neuromuscular re-ed: Gait training heel strike/ toe off taking smaller stride utilizing natural rocker to assist with efficient gait pattern.   PATIENT EDUCATION:  Education details: Evaluation findings, POC, goals, HEP with proper form/ rationale. Person educated: Patient Education method: Explanation, Demonstration, Verbal cues,  and Handouts Education comprehension: verbalized understanding  HOME EXERCISE PROGRAM: Access Code: [email or web address] URL: [email or web address] Date: 06/03/2023 Prepared by: [name]  Exercises - Gastroc Stretch with Foot at Wall  - 3 x daily - 7 x weekly - 1-2 sets - 1-2 reps - 30-60 seconds hold - Soleus Stretch with Foot at Wall  - 3 x daily - 7 x weekly - 1 -2 sets - 1 - 2 reps - 30- 60 seconds hold - Seated Ankle Eversion with Resistance  - 1 x daily - 7 x weekly - 2 sets - 10 reps - Seated Calf Stretch with Strap  - 1 x daily - 7 x weekly - 1 - 2 sets - 2 reps - 30 - 60 seconds hold - Seated Ankle Dorsiflexion with Resistance  - 1 x daily - 7 x weekly - 2 sets - 10 reps - Seated Ankle Inversion with Resistance  - 1  x daily - 7 x weekly - 2 sets - 10 reps - Seated Heel Raise  - 1 x daily - 7 x weekly - 2 sets - 15 reps - Gastroc Stretch on Wall  - 1 x daily - 7 x weekly - 1 sets - 3 reps - 20 hold - Standing Soleus Stretch  - 1 x daily - 7 x weekly - 1 sets - 3 reps - 20 hold ASSESSMENT:  CLINICAL IMPRESSION: 06/12/2023 Turan reports consistency with his HEP and notes that he is doing better with stretching which has helped more in the morning but he continues to have elevated pain. He did a lot of walking earlier while picking up trash at the baseball field. Today his HEP was progressed with closed chain stretches which he tolerated well. Continued working on STW along bil gastroc/ soleus, plantar fascia. He still demonstrates taut bands of muscle and Trigger points are in medial gastroc. Minimal reduction in Tension after manual. Pt is still considering TPDN. He will have 3 more visits prior to knee scope.     ASSESSMENT AT EVALUATION: Patient is a 57 y.o. M who was seen today for physical therapy evaluation and treatment for dx of R Plantar fasciitis. Pt presents with R heel pain that started insidisously 6 months ago.  He has functional ankle ROM compared bil with weakness in R ankle DF/ inversion/ eversion with pain during assessment. TTP along bil gastroc/ soleus and posterior tib with R worse than left and specific point tenderness along the medial calcaneal tubercle. Patient responded well in session with MTPR along the R calf and posterior tib, followed with stretching calf strengthening. He noted relief in the calf and in the heel end of the session. Pt would benefit from physical therapy to decrease R heel pain, reduce calf tightness, improve gross LE strength, and maximize gait efficiency and overall function by addressing the deficits listed.   OBJECTIVE IMPAIRMENTS: Abnormal gait, decreased activity tolerance, decreased endurance, decreased strength, increased fascial restrictions, increased muscle  spasms, improper body mechanics, postural dysfunction, and pain.   ACTIVITY LIMITATIONS: lifting, standing, squatting, and locomotion level  PARTICIPATION LIMITATIONS: community activity, occupation, and yard work  PERSONAL FACTORS: Age, Past/current experiences, and 1-2 comorbidities: hx of L PF tear, R knee surgery  are also affecting patient's functional outcome.   REHAB POTENTIAL: Good  CLINICAL DECISION MAKING: Evolving/moderate complexity  EVALUATION COMPLEXITY: Moderate   GOALS: Goals reviewed with patient? Yes  SHORT TERM GOALS: Target date: 07/01/2023   PT to be IND with initial HEP for therapeutic  progression Baseline: Goal status: INITIAL  2.  Pt to report pain to </= 7/10 max with getting out of bed in the AM for improvement in condition Baseline:  Goal status: INITIAL  3.  Pt to verbalize/ demo efficient gait pattern utilizing heel strike/ toe off with </= 5/10 max pain noted during gait. Baseline:  Goal status: INITIAL   LONG TERM GOALS: Target date: 07/29/2023  Increase R calf gross strength to >/= 4+/5 to promote ankle strength/ stability with walking/ standing Baseline:  Goal status: INITIAL  2.  Pt to report max pain in the morning to </= 3/10 and during the day to </=1/10 pain for improvement in condition.  Baseline:  Goal status: INITIAL  3.  Improve FOTO score to >/= 70% to demo improvement in function Baseline:  Goal status: INITIAL  4.  Pt to be able to sit/ stand and walk  and perform work related activities without limitation. Baseline:  Goal status: INITIAL  5.  Pt to be IND with all HEP and will is able to maintain and progress their current level of function IND. Baseline:  Goal status: INITIAL   PLAN:  PT FREQUENCY: 1-2x/week  PT DURATION: 8 weeks  PLANNED INTERVENTIONS: 97110-Therapeutic exercises, 97530- Therapeutic activity, [id number]- Neuromuscular re-education, 97535- Self Care, [id number]- Manual therapy, [id number]- Gait training,  [id number]- Ultrasound, [id number]- Ionotophoresis 4mg /ml Dexamethasone, Taping, Dry Needling, Joint mobilization, Joint manipulation, Cryotherapy, and Moist heat  PLAN FOR NEXT SESSION: Review/ update HEP PRN. STW along the R gastroc/ soleus, consider DN if pt is up for it. Foot intrinsic strengthening, ankle extrinsic strengtheneing, calf stretching.    Jannette Spanner, PTA 06/12/23 3:37 PM Phone: [phone] Fax: [phone]

## 2023-06-14 ENCOUNTER — Other Ambulatory Visit: Payer: Self-pay | Admitting: Family Medicine

## 2023-06-16 ENCOUNTER — Other Ambulatory Visit: Payer: Self-pay | Admitting: Family Medicine

## 2023-06-17 ENCOUNTER — Ambulatory Visit: Payer: 59 | Admitting: Physical Therapy

## 2023-06-17 ENCOUNTER — Encounter: Payer: Self-pay | Admitting: Internal Medicine

## 2023-06-17 ENCOUNTER — Encounter: Payer: Self-pay | Admitting: Physical Therapy

## 2023-06-17 DIAGNOSIS — M25571 Pain in right ankle and joints of right foot: Secondary | ICD-10-CM

## 2023-06-17 DIAGNOSIS — R2689 Other abnormalities of gait and mobility: Secondary | ICD-10-CM

## 2023-06-17 NOTE — Therapy (Signed)
OUTPATIENT PHYSICAL THERAPY TREATMENT   Patient Name: Mitchell Palmer MRN: [id number] DOB:1965-12-10, 57 y.o., male Today's Date: 06/17/2023  END OF SESSION:  PT End of Session - 06/17/23 1058     Visit Number 4    Number of Visits 17    Date for PT Re-Evaluation 07/29/23    Authorization Type [employer]    PT Start Time 1058    PT Stop Time 1146    PT Time Calculation (min) 48 min    Activity Tolerance Patient tolerated treatment well    Behavior During Therapy WFL for tasks assessed/performed               Past Medical History:  Diagnosis Date   Allergy    allegic rhinitis   Diabetes mellitus    type II   Esophageal stricture    GERD (gastroesophageal reflux disease)    History of hernia repair    as a baby   Hyperlipidemia    Hyperplastic polyp of intestine 2010   Hypertension    OSA (obstructive sleep apnea) 04/18/2022   Proliferative diabetic retinopathy of left eye (HCC) 03/31/2020   This is active as a patient still has tufts of the anterior segment neovascularization, rubeosis in the left eye every region of capillary nonperfusion should be treated with PRP.   S/P ear surgery, follow-up exam    for drainage   Vitreous hemorrhage of right eye (HCC) 12/24/2019   Past Surgical History:  Procedure Laterality Date   BIOPSY  12/07/2021   Procedure: BIOPSY;  Surgeon: Meryl Dare, MD;  Location: [facility];  Service: Gastroenterology;;   CATARACT EXTRACTION     COLONOSCOPY  03/07/2016   COLONOSCOPY WITH PROPOFOL N/A 12/07/2021   Procedure: COLONOSCOPY WITH PROPOFOL;  Surgeon: Meryl Dare, MD;  Location: [facility];  Service: Gastroenterology;  Laterality: N/A;   ESOPHAGOGASTRODUODENOSCOPY  04/20/2009   stricture/GERD   ESOPHAGOGASTRODUODENOSCOPY (EGD) WITH PROPOFOL N/A 12/07/2021   Procedure: ESOPHAGOGASTRODUODENOSCOPY (EGD) WITH PROPOFOL;  Surgeon: Meryl Dare, MD;  Location: [facility];  Service: Gastroenterology;  Laterality: N/A;   EYE SURGERY      HERNIA REPAIR     age 72-midline   INNER EAR SURGERY  08/20/1986   went in behind rt ear-blockage   KNEE ARTHROSCOPY Right 2023   NECK SURGERY     [facility]   POLYPECTOMY     SAVORY DILATION N/A 12/07/2021   Procedure: SAVORY DILATION;  Surgeon: Meryl Dare, MD;  Location: [facility];  Service: Gastroenterology;  Laterality: N/A;   SHOULDER ARTHROSCOPY WITH ROTATOR CUFF REPAIR AND SUBACROMIAL DECOMPRESSION Right 09/29/2012   Procedure: SHOULDER ARTHROSCOPY WITH ROTATOR CUFF REPAIR AND SUBACROMIAL DECOMPRESSION;  Surgeon: Mable Paris, MD;  Location: [facility];  Service: Orthopedics;  Laterality: Right;  Right shoulder arthroscopy with subacromial decompression and distal clavicle excision & Debridement of Labrial Tear   SHOULDER SURGERY Left    x2   UPPER GASTROINTESTINAL ENDOSCOPY     Patient Active Problem List   Diagnosis Date Noted   Peripheral neuropathy 02/08/2023   Heel pain, bilateral 02/08/2023   Dyspnea on exertion 07/30/2022   OSA (obstructive sleep apnea) 04/18/2022   Loud snoring 12/12/2021   Family history of colon cancer    Lump on finger 11/06/2021   Pseudophakia, left eye 10/26/2021   Pseudophakia, right eye 10/26/2021   Vitreomacular adhesion of left eye 09/21/2021   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/2021   Vitreous hemorrhage, left eye (HCC) 06/07/2021  Proliferative diabetic retinopathy of left eye with macular edema associated with type 2 diabetes mellitus (HCC) 06/07/2021   Localized swelling, mass and lump, upper limb 05/31/2021   Low vitamin D level 09/14/2020   Prostate cancer screening 09/02/2020   Current use of proton pump inhibitor 09/02/2020   Family history of factor V Leiden mutation 05/30/2020   Left ankle pain 01/29/2020   Obesity (BMI 30-39.9) 12/24/2019   Proliferative diabetic retinopathy of left eye determined by examination (HCC) 12/24/2019   Nuclear sclerotic cataract of left eye 12/24/2019    Low testosterone 11/13/2019   Fatigue 10/20/2019   Neck pain 04/28/2018   Need for hepatitis B screening test 03/26/2018   Need for hepatitis C screening test 03/26/2018   Paresthesia of both feet 11/01/2017   Hemorrhoids 10/14/2017   Class 2 obesity due to excess calories with body mass index (BMI) of 35.0 to 35.9 in adult 01/24/2016   Hypothyroidism 01/24/2016   Type 2 diabetes mellitus with ophthalmic complication (HCC) 07/28/2015   Alcohol consumption heavy 12/06/2014   Routine general medical examination at a health care facility 09/30/2013   Low back pain 06/17/2012   PLANTAR FASCIITIS, TRAUMATIC 11/09/2009   NEOPLASM UNCERTAIN BEHAVIOR OTHER SPEC SITES 10/07/2009   ESOPHAGEAL STRICTURE 06/27/2009   GERD 03/31/2009   Hyperlipidemia associated with type 2 diabetes mellitus (HCC) 01/06/2009   Essential hypertension 01/06/2009   Allergic rhinitis 01/06/2009    PCP: Judy Pimple, MD  REFERRING PROVIDER: Louann Sjogren, DPM   REFERRING DIAG: Plantar fasciitis, right [M72.2]   THERAPY DIAG:  Pain in right ankle and joints of right foot  Other abnormalities of gait and mobility  Rationale for Evaluation and Treatment: Rehabilitation  ONSET DATE: 6 months   SUBJECTIVE:   SUBJECTIVE STATEMENT: "I think things are going pretty good, plan to get the R knee scoped on 11/6."  PERTINENT HISTORY: See PMHx PAIN:  Are you having pain? Yes: NPRS scale: 4/10 Pain location: R heel  Pain description: sharpness Aggravating factors: getting out of bed in the morning Relieving factors: ankle brace, stretch   PRECAUTIONS: None  RED FLAGS: None   WEIGHT BEARING RESTRICTIONS: No  FALLS:  Has patient fallen in last 6 months? No  LIVING ENVIRONMENT: Lives with: lives with their family Lives in: House/apartment Stairs: Yes: External: 3 steps; none Has following equipment at home:  Brace  OCCUPATION: city of [place],  PLOF: Independent with basic ADLs  PATIENT  GOALS: stretching, relieving pain.    OBJECTIVE:  Note: Objective measures were completed at Evaluation unless otherwise noted.  DIAGNOSTIC FINDINGS:at MD's office.   PATIENT SURVEYS:  FOTO 65%  predicted 70%  COGNITION: Overall cognitive status: Within functional limits for tasks assessed     SENSATION: WFL  POSTURE: rounded shoulders and forward head  PALPATION: TTP along the  bil with R>L calf/ gastroc and posterior tibialis and at the R medial calcaneal tubercle.   LOWER EXTREMITY ROM:  Active ROM Right eval Left eval  Hip flexion    Hip extension    Hip abduction    Hip adduction    Hip internal rotation    Hip external rotation    Knee flexion    Knee extension    Ankle dorsiflexion    Ankle plantarflexion    Ankle inversion    Ankle eversion     (Blank rows = not tested)  LOWER EXTREMITY MMT:  MMT Right eval Left eval  Hip flexion    Hip extension  Hip abduction    Hip adduction    Hip internal rotation    Hip external rotation    Knee flexion    Knee extension    Ankle dorsiflexion 4+ P! 5  Ankle plantarflexion 5 5  Ankle inversion 4+ P! 5  Ankle eversion 3+  5   (Blank rows = not tested)   GAIT: Distance walked: 120 from waiting room to tx area Assistive device utilized: None Level of assistance: Complete Independence Comments: antalgic gait pattern with limited heel strike on the R compared bil   TODAY'S TREATMENT:                                                                                                                               [facility] Adult PT Treatment:                                                DATE: 06/17/23  Therapeutic Exercise: Standing slant board calf stretch knee straight/ bent 2 x 30 sec ea.  Standing heel raise with ball between ankles 1 x 12, progressed to doing on slant board 1 x 12 Seated hip ER 2 x 12 with ball between knees RLE with RTB  Updated HEP for R hip ER Manual Therapy: IASTM along the R  gastroc/ soleues Twisting over the medial calcaneal tubercle  Trigger Point Dry-Needling  Treatment instructions: Expect mild to moderate muscle soreness. S/S of pneumothorax if dry needled over a lung field, and to seek immediate medical attention should they occur. Patient verbalized understanding of these instructions and education.  Patient Consent Given: Yes Education handout provided: Yes Muscles treated: R gastroc/ soleus (medial aspect) Electrical stimulation performed: Yes Parameters:  CPS L 20 x 10 min adjusting to tolerance Treatment response/outcome: twitch response , relief of tension noted   [facility] Adult PT Treatment:                                                DATE: 06/12/23 Therapeutic Exercise: Towel scrunch  Toe extensions  Toe Yoga  Seated heel raise  Standing gastroc stretch x 2 L, 15-20 sec Standing soleus stretch x 2 L, 15-20 sec Manual Therapy: STW along gastroc and soleus TPR medial Gastroc Soft tissue strumming to plantar fascia Passive Gastroc and soleus stretches   [facility] Adult PT Treatment:                                                DATE: 06/10/2023 Therapeutic Exercise: Seated calf stretch with strap 3 x  30 sec bil Seated heel raise with met heads on rolled towel 1 x 12, progressed to standing 2 x 12 Towel scrunch 2 x 15 Manual Therapy: MTPR along bil gastroc/ soleus, and R quadratus plantae IASTM techniques along bil gastroc/ soleus IASTM turning / stretching technique focused on medial calcaneal tubercle Active release on the R  PATIENT EDUCATION:  Education details: Evaluation findings, POC, goals, HEP with proper form/ rationale. Person educated: Patient Education method: Explanation, Demonstration, Verbal cues, and Handouts Education comprehension: verbalized understanding  HOME EXERCISE PROGRAM: Access Code: [email or web address] URL: [email or web address] Date: 06/17/2023 Prepared by: [name]  Exercises - Gastroc  Stretch with Foot at Wall  - 3 x daily - 7 x weekly - 1-2 sets - 1-2 reps - 30-60 seconds hold - Soleus Stretch with Foot at Wall  - 3 x daily - 7 x weekly - 1 -2 sets - 1 - 2 reps - 30- 60 seconds hold - Seated Ankle Eversion with Resistance  - 1 x daily - 7 x weekly - 2 sets - 10 reps - Seated Calf Stretch with Strap  - 1 x daily - 7 x weekly - 1 - 2 sets - 2 reps - 30 - 60 seconds hold - Seated Ankle Dorsiflexion with Resistance  - 1 x daily - 7 x weekly - 2 sets - 10 reps - Seated Ankle Inversion with Resistance  - 1 x daily - 7 x weekly - 2 sets - 10 reps - Seated Heel Raise  - 1 x daily - 7 x weekly - 2 sets - 15 reps - Gastroc Stretch on Wall  - 1 x daily - 7 x weekly - 1 sets - 3 reps - 20 hold - Standing Soleus Stretch  - 1 x daily - 7 x weekly - 1 sets - 3 reps - 20 hold - Seated Hip External Rotation with Resistance  - 1 x daily - 7 x weekly - 2 sets - 12 reps  ASSESSMENT:  CLINICAL IMPRESSION: 06/17/2023 Elston reports the foot continues to improves with physical therapy. Educated and consent was given for TPDN for the R gastroc/ soleus combined with E-stim followed with IASTM techniques. Continued stretching, and posterior tib activation as well as hip ER activation to promote both proximal and distal activation. End of session he reported feeling no pain with only a charlie horse sensation located at the areas that were dry needled today.     ASSESSMENT AT EVALUATION: Patient is a 57 y.o. M who was seen today for physical therapy evaluation and treatment for dx of R Plantar fasciitis. Pt presents with R heel pain that started insidisously 6 months ago.  He has functional ankle ROM compared bil with weakness in R ankle DF/ inversion/ eversion with pain during assessment. TTP along bil gastroc/ soleus and posterior tib with R worse than left and specific point tenderness along the medial calcaneal tubercle. Patient responded well in session with MTPR along the R calf and posterior tib,  followed with stretching calf strengthening. He noted relief in the calf and in the heel end of the session. Pt would benefit from physical therapy to decrease R heel pain, reduce calf tightness, improve gross LE strength, and maximize gait efficiency and overall function by addressing the deficits listed.   OBJECTIVE IMPAIRMENTS: Abnormal gait, decreased activity tolerance, decreased endurance, decreased strength, increased fascial restrictions, increased muscle spasms, improper body mechanics, postural dysfunction, and pain.   ACTIVITY LIMITATIONS: lifting, standing, squatting, and locomotion level  PARTICIPATION LIMITATIONS: community activity, occupation, and yard work  PERSONAL FACTORS: Age, Past/current experiences, and 1-2 comorbidities: hx of L PF tear, R knee surgery  are also affecting patient's functional outcome.   REHAB POTENTIAL: Good  CLINICAL DECISION MAKING: Evolving/moderate complexity  EVALUATION COMPLEXITY: Moderate   GOALS: Goals reviewed with patient? Yes  SHORT TERM GOALS: Target date: 07/01/2023   PT to be IND with initial HEP for therapeutic progression Baseline: Goal status: INITIAL  2.  Pt to report pain to </= 7/10 max with getting out of bed in the AM for improvement in condition Baseline:  Goal status: INITIAL  3.  Pt to verbalize/ demo efficient gait pattern utilizing heel strike/ toe off with </= 5/10 max pain noted during gait. Baseline:  Goal status: INITIAL   LONG TERM GOALS: Target date: 07/29/2023  Increase R calf gross strength to >/= 4+/5 to promote ankle strength/ stability with walking/ standing Baseline:  Goal status: INITIAL  2.  Pt to report max pain in the morning to </= 3/10 and during the day to </=1/10 pain for improvement in condition.  Baseline:  Goal status: INITIAL  3.  Improve FOTO score to >/= 70% to demo improvement in function Baseline:  Goal status: INITIAL  4.  Pt to be able to sit/ stand and walk  and perform  work related activities without limitation. Baseline:  Goal status: INITIAL  5.  Pt to be IND with all HEP and will is able to maintain and progress their current level of function IND. Baseline:  Goal status: INITIAL   PLAN:  PT FREQUENCY: 1-2x/week  PT DURATION: 8 weeks  PLANNED INTERVENTIONS: 97110-Therapeutic exercises, 97530- Therapeutic activity, [id number]- Neuromuscular re-education, 97535- Self Care, [id number]- Manual therapy, [id number]- Gait training, [id number]- Ultrasound, [phone]- Ionotophoresis 4mg /ml Dexamethasone, Taping, Dry Needling, Joint mobilization, Joint manipulation, Cryotherapy, and Moist heat  PLAN FOR NEXT SESSION: Review/ update HEP PRN. STW along the R gastroc/ soleus, consider DN if pt is up for it. Foot intrinsic strengthening, ankle extrinsic strengtheneing, calf stretching.  Response to TPDN & continue ?  Allyiah Gartner PT, DPT, LAT, ATC  06/17/23  11:58 AM

## 2023-06-17 NOTE — Patient Instructions (Signed)

## 2023-06-20 ENCOUNTER — Other Ambulatory Visit (HOSPITAL_COMMUNITY): Payer: Self-pay

## 2023-06-20 ENCOUNTER — Telehealth: Payer: Self-pay | Admitting: Internal Medicine

## 2023-06-20 ENCOUNTER — Ambulatory Visit: Payer: 59 | Admitting: Physical Therapy

## 2023-06-20 ENCOUNTER — Encounter: Payer: Self-pay | Admitting: Physical Therapy

## 2023-06-20 DIAGNOSIS — E11319 Type 2 diabetes mellitus with unspecified diabetic retinopathy without macular edema: Secondary | ICD-10-CM

## 2023-06-20 DIAGNOSIS — M25571 Pain in right ankle and joints of right foot: Secondary | ICD-10-CM | POA: Diagnosis not present

## 2023-06-20 DIAGNOSIS — M6281 Muscle weakness (generalized): Secondary | ICD-10-CM

## 2023-06-20 DIAGNOSIS — R2689 Other abnormalities of gait and mobility: Secondary | ICD-10-CM

## 2023-06-20 MED ORDER — MOUNJARO 15 MG/0.5ML ~~LOC~~ SOAJ
15.0000 mg | SUBCUTANEOUS | 2 refills | Status: DC
  Filled 2023-06-20: qty 2, 28d supply, fill #0

## 2023-06-20 NOTE — Telephone Encounter (Signed)
Patient called back to check on status of refill for [facility] - refill requested - patient is out of medication.

## 2023-06-20 NOTE — Therapy (Signed)
OUTPATIENT PHYSICAL THERAPY TREATMENT   Patient Name: Mitchell Palmer MRN: [id number] DOB:1965-12-31, 57 y.o., male Today's Date: 06/20/2023  END OF SESSION:  PT End of Session - 06/20/23 1321     Visit Number 5    Number of Visits 17    Date for PT Re-Evaluation 07/29/23    Authorization Type [employer]    PT Start Time 1320    PT Stop Time 1405    PT Time Calculation (min) 45 min    Activity Tolerance Patient tolerated treatment well    Behavior During Therapy WFL for tasks assessed/performed               Past Medical History:  Diagnosis Date   Allergy    allegic rhinitis   Diabetes mellitus    type II   Esophageal stricture    GERD (gastroesophageal reflux disease)    History of hernia repair    as a baby   Hyperlipidemia    Hyperplastic polyp of intestine 2010   Hypertension    OSA (obstructive sleep apnea) 04/18/2022   Proliferative diabetic retinopathy of left eye (HCC) 03/31/2020   This is active as a patient still has tufts of the anterior segment neovascularization, rubeosis in the left eye every region of capillary nonperfusion should be treated with PRP.   S/P ear surgery, follow-up exam    for drainage   Vitreous hemorrhage of right eye (HCC) 12/24/2019   Past Surgical History:  Procedure Laterality Date   BIOPSY  12/07/2021   Procedure: BIOPSY;  Surgeon: Meryl Dare, MD;  Location: [facility];  Service: Gastroenterology;;   CATARACT EXTRACTION     COLONOSCOPY  03/07/2016   COLONOSCOPY WITH PROPOFOL N/A 12/07/2021   Procedure: COLONOSCOPY WITH PROPOFOL;  Surgeon: Meryl Dare, MD;  Location: [facility];  Service: Gastroenterology;  Laterality: N/A;   ESOPHAGOGASTRODUODENOSCOPY  04/20/2009   stricture/GERD   ESOPHAGOGASTRODUODENOSCOPY (EGD) WITH PROPOFOL N/A 12/07/2021   Procedure: ESOPHAGOGASTRODUODENOSCOPY (EGD) WITH PROPOFOL;  Surgeon: Meryl Dare, MD;  Location: [facility];  Service: Gastroenterology;  Laterality: N/A;   EYE SURGERY      HERNIA REPAIR     age 36-midline   INNER EAR SURGERY  08/20/1986   went in behind rt ear-blockage   KNEE ARTHROSCOPY Right 2023   NECK SURGERY     [facility]   POLYPECTOMY     SAVORY DILATION N/A 12/07/2021   Procedure: SAVORY DILATION;  Surgeon: Meryl Dare, MD;  Location: [facility];  Service: Gastroenterology;  Laterality: N/A;   SHOULDER ARTHROSCOPY WITH ROTATOR CUFF REPAIR AND SUBACROMIAL DECOMPRESSION Right 09/29/2012   Procedure: SHOULDER ARTHROSCOPY WITH ROTATOR CUFF REPAIR AND SUBACROMIAL DECOMPRESSION;  Surgeon: Mable Paris, MD;  Location: [facility];  Service: Orthopedics;  Laterality: Right;  Right shoulder arthroscopy with subacromial decompression and distal clavicle excision & Debridement of Labrial Tear   SHOULDER SURGERY Left    x2   UPPER GASTROINTESTINAL ENDOSCOPY     Patient Active Problem List   Diagnosis Date Noted   Peripheral neuropathy 02/08/2023   Heel pain, bilateral 02/08/2023   Dyspnea on exertion 07/30/2022   OSA (obstructive sleep apnea) 04/18/2022   Loud snoring 12/12/2021   Family history of colon cancer    Lump on finger 11/06/2021   Pseudophakia, left eye 10/26/2021   Pseudophakia, right eye 10/26/2021   Vitreomacular adhesion of left eye 09/21/2021   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/2021   Vitreous hemorrhage, left eye (HCC) 06/07/2021  Proliferative diabetic retinopathy of left eye with macular edema associated with type 2 diabetes mellitus (HCC) 06/07/2021   Localized swelling, mass and lump, upper limb 05/31/2021   Low vitamin D level 09/14/2020   Prostate cancer screening 09/02/2020   Current use of proton pump inhibitor 09/02/2020   Family history of factor V Leiden mutation 05/30/2020   Left ankle pain 01/29/2020   Obesity (BMI 30-39.9) 12/24/2019   Proliferative diabetic retinopathy of left eye determined by examination (HCC) 12/24/2019   Nuclear sclerotic cataract of left eye 12/24/2019    Low testosterone 11/13/2019   Fatigue 10/20/2019   Neck pain 04/28/2018   Need for hepatitis B screening test 03/26/2018   Need for hepatitis C screening test 03/26/2018   Paresthesia of both feet 11/01/2017   Hemorrhoids 10/14/2017   Class 2 obesity due to excess calories with body mass index (BMI) of 35.0 to 35.9 in adult 01/24/2016   Hypothyroidism 01/24/2016   Type 2 diabetes mellitus with ophthalmic complication (HCC) 07/28/2015   Alcohol consumption heavy 12/06/2014   Routine general medical examination at a health care facility 09/30/2013   Low back pain 06/17/2012   PLANTAR FASCIITIS, TRAUMATIC 11/09/2009   NEOPLASM UNCERTAIN BEHAVIOR OTHER SPEC SITES 10/07/2009   ESOPHAGEAL STRICTURE 06/27/2009   GERD 03/31/2009   Hyperlipidemia associated with type 2 diabetes mellitus (HCC) 01/06/2009   Essential hypertension 01/06/2009   Allergic rhinitis 01/06/2009    PCP: Judy Pimple, MD  REFERRING PROVIDER: Louann Sjogren, DPM   REFERRING DIAG: Plantar fasciitis, right [M72.2]   THERAPY DIAG:  Pain in right ankle and joints of right foot  Muscle weakness (generalized)  Other abnormalities of gait and mobility  Rationale for Evaluation and Treatment: Rehabilitation  ONSET DATE: 6 months   SUBJECTIVE:   SUBJECTIVE STATEMENT: "I still feel pretty good after the last session, with an exception of walking up the hill today to come into the clinic."  PERTINENT HISTORY: See PMHx PAIN:  Are you having pain? Yes: NPRS scale: 2/10 Pain location: R heel  Pain description: sharpness Aggravating factors: getting out of bed in the morning Relieving factors: ankle brace, stretch   PRECAUTIONS: None  RED FLAGS: None   WEIGHT BEARING RESTRICTIONS: No  FALLS:  Has patient fallen in last 6 months? No  LIVING ENVIRONMENT: Lives with: lives with their family Lives in: House/apartment Stairs: Yes: External: 3 steps; none Has following equipment at home:   Brace  OCCUPATION: city [employer] [place],  PLOF: Independent with basic ADLs  PATIENT GOALS: stretching, relieving pain.    OBJECTIVE:  Note: Objective measures were completed at Evaluation unless otherwise noted.  DIAGNOSTIC FINDINGS:at MD's office.   PATIENT SURVEYS:  FOTO 65%  predicted 70%  COGNITION: Overall cognitive status: Within functional limits for tasks assessed     SENSATION: WFL  POSTURE: rounded shoulders and forward head  PALPATION: TTP along the  bil with R>L calf/ gastroc and posterior tibialis and at the R medial calcaneal tubercle.   LOWER EXTREMITY ROM:  Active ROM Right eval Left eval  Hip flexion    Hip extension    Hip abduction    Hip adduction    Hip internal rotation    Hip external rotation    Knee flexion    Knee extension    Ankle dorsiflexion    Ankle plantarflexion    Ankle inversion    Ankle eversion     (Blank rows = not tested)  LOWER EXTREMITY MMT:  MMT Right eval  Left eval  Hip flexion    Hip extension    Hip abduction    Hip adduction    Hip internal rotation    Hip external rotation    Knee flexion    Knee extension    Ankle dorsiflexion 4+ P! 5  Ankle plantarflexion 5 5  Ankle inversion 4+ P! 5  Ankle eversion 3+  5   (Blank rows = not tested)   GAIT: Distance walked: 120 from waiting room to tx area Assistive device utilized: None Level of assistance: Complete Independence Comments: antalgic gait pattern with limited heel strike on the R compared bil   TODAY'S TREATMENT:                                                                                                                               [facility] Adult PT Treatment:                                                DATE: 06/20/2023 Therapeutic Exercise: Standing calf stretch 2 x 30 sec with knee straight/ bent on slant board Seated ankle DF R 2 x 15 with Blue theraband Manual Therapy: IASTM along the R gastroc/ soleues Twisting over the medial  calcaneal tubercle Talocrural grade V bil with cavitation noted  Treatment instructions: Expect mild to moderate muscle soreness. S/S of pneumothorax if dry needled over a lung field, and to seek immediate medical attention should they occur. Patient verbalized understanding of these instructions and education.  Patient Consent Given: Yes Education handout provided: Yes Muscles treated: R gastroc/ soleus (medial aspect) Electrical stimulation performed: Yes Parameters: CPS L 20 x 10 min adjusting to tolerance Treatment response/outcome: twitch response , relief of tension noted   [facility] Adult PT Treatment:                                                DATE: 06/17/23 Therapeutic Exercise: Standing slant board calf stretch knee straight/ bent 2 x 30 sec ea.  Standing heel raise with ball between ankles 1 x 12, progressed to doing on slant board 1 x 12 Seated hip ER 2 x 12 with ball between knees RLE with RTB  Updated HEP for R hip ER Manual Therapy: IASTM along the R gastroc/ soleues Twisting over the medial calcaneal tubercle  Trigger Point Dry-Needling  Treatment instructions: Expect mild to moderate muscle soreness. S/S of pneumothorax if dry needled over a lung field, and to seek immediate medical attention should they occur. Patient verbalized understanding of these instructions and education.  Patient Consent Given: Yes Education handout provided: Yes Muscles treated: R gastroc/ soleus (medial aspect) Electrical stimulation performed: Yes Parameters:  CPS L 20 x 10 min adjusting to tolerance Treatment response/outcome: twitch response , relief of tension noted   [facility] Adult PT Treatment:                                                DATE: 06/12/23 Therapeutic Exercise: Towel scrunch  Toe extensions  Toe Yoga  Seated heel raise  Standing gastroc stretch x 2 L, 15-20 sec Standing soleus stretch x 2 L, 15-20 sec Manual Therapy: STW along gastroc and soleus TPR medial  Gastroc Soft tissue strumming to plantar fascia Passive Gastroc and soleus stretches   PATIENT EDUCATION:  Education details: Evaluation findings, POC, goals, HEP with proper form/ rationale. Person educated: Patient Education method: Explanation, Demonstration, Verbal cues, and Handouts Education comprehension: verbalized understanding  HOME EXERCISE PROGRAM: Access Code: [email or web address] URL: [email or web address] Date: 06/17/2023 Prepared by: [name]  Exercises - Gastroc Stretch with Foot at Wall  - 3 x daily - 7 x weekly - 1-2 sets - 1-2 reps - 30-60 seconds hold - Soleus Stretch with Foot at Wall  - 3 x daily - 7 x weekly - 1 -2 sets - 1 - 2 reps - 30- 60 seconds hold - Seated Ankle Eversion with Resistance  - 1 x daily - 7 x weekly - 2 sets - 10 reps - Seated Calf Stretch with Strap  - 1 x daily - 7 x weekly - 1 - 2 sets - 2 reps - 30 - 60 seconds hold - Seated Ankle Dorsiflexion with Resistance  - 1 x daily - 7 x weekly - 2 sets - 10 reps - Seated Ankle Inversion with Resistance  - 1 x daily - 7 x weekly - 2 sets - 10 reps - Seated Heel Raise  - 1 x daily - 7 x weekly - 2 sets - 15 reps - Gastroc Stretch on Wall  - 1 x daily - 7 x weekly - 1 sets - 3 reps - 20 hold - Standing Soleus Stretch  - 1 x daily - 7 x weekly - 1 sets - 3 reps - 20 hold - Seated Hip External Rotation with Resistance  - 1 x daily - 7 x weekly - 2 sets - 12 reps  ASSESSMENT:  CLINICAL IMPRESSION: 06/20/2023 Mitchell Hua reported improvement in calf tightness/ pain following previous session with dry needling and reports pain today at 2/10. Continued TPDN today combined with E-stim followed with IASTM techniques, and trialed talocrural manipulation with cavitation noted that occurred without needing to utilize a thrust technique. Continued stretching, end of session he noted feeling more soreness which is likely a side effect of the dry needling which will likely resolve in the next 24-48 hours.      ASSESSMENT AT EVALUATION: Patient is a 57 y.o. M who was seen today for physical therapy evaluation and treatment for dx of R Plantar fasciitis. Pt presents with R heel pain that started insidisously 6 months ago.  He has functional ankle ROM compared bil with weakness in R ankle DF/ inversion/ eversion with pain during assessment. TTP along bil gastroc/ soleus and posterior tib with R worse than left and specific point tenderness along the medial calcaneal tubercle. Patient responded well in session with MTPR along the R calf and posterior tib, followed with stretching calf strengthening. He noted relief in the calf and in the  heel end of the session. Pt would benefit from physical therapy to decrease R heel pain, reduce calf tightness, improve gross LE strength, and maximize gait efficiency and overall function by addressing the deficits listed.   OBJECTIVE IMPAIRMENTS: Abnormal gait, decreased activity tolerance, decreased endurance, decreased strength, increased fascial restrictions, increased muscle spasms, improper body mechanics, postural dysfunction, and pain.   ACTIVITY LIMITATIONS: lifting, standing, squatting, and locomotion level  PARTICIPATION LIMITATIONS: community activity, occupation, and yard work  PERSONAL FACTORS: Age, Past/current experiences, and 1-2 comorbidities: hx of L PF tear, R knee surgery  are also affecting patient's functional outcome.   REHAB POTENTIAL: Good  CLINICAL DECISION MAKING: Evolving/moderate complexity  EVALUATION COMPLEXITY: Moderate   GOALS: Goals reviewed with patient? Yes  SHORT TERM GOALS: Target date: 07/01/2023   PT to be IND with initial HEP for therapeutic progression Baseline: Goal status: INITIAL  2.  Pt to report pain to </= 7/10 max with getting out of bed in the AM for improvement in condition Baseline:  Goal status: INITIAL  3.  Pt to verbalize/ demo efficient gait pattern utilizing heel strike/ toe off with </= 5/10  max pain noted during gait. Baseline:  Goal status: INITIAL   LONG TERM GOALS: Target date: 07/29/2023  Increase R calf gross strength to >/= 4+/5 to promote ankle strength/ stability with walking/ standing Baseline:  Goal status: INITIAL  2.  Pt to report max pain in the morning to </= 3/10 and during the day to </=1/10 pain for improvement in condition.  Baseline:  Goal status: INITIAL  3.  Improve FOTO score to >/= 70% to demo improvement in function Baseline:  Goal status: INITIAL  4.  Pt to be able to sit/ stand and walk  and perform work related activities without limitation. Baseline:  Goal status: INITIAL  5.  Pt to be IND with all HEP and will is able to maintain and progress their current level of function IND. Baseline:  Goal status: INITIAL   PLAN:  PT FREQUENCY: 1-2x/week  PT DURATION: 8 weeks  PLANNED INTERVENTIONS: 97110-Therapeutic exercises, 97530- Therapeutic activity, [id number]- Neuromuscular re-education, 97535- Self Care, [id number]- Manual therapy, [id number]- Gait training, [id number]- Ultrasound, [id number]- Ionotophoresis 4mg /ml Dexamethasone, Taping, Dry Needling, Joint mobilization, Joint manipulation, Cryotherapy, and Moist heat  PLAN FOR NEXT SESSION: Review/ update HEP PRN. STW along the R gastroc/ soleus, consider DN if pt is up for it. Foot intrinsic strengthening, ankle extrinsic strengtheneing, calf stretching.  Response to TPDN & continue ?  Myna Freimark PT, DPT, LAT, ATC  06/20/23  2:12 PM

## 2023-06-20 NOTE — Telephone Encounter (Signed)
MEDICATION:  Harl Favor 15 MG/0.5ML Pen  PHARMACY:   [facility] Address: [address] [place], [place], [place] [address] Phone: [phone]  HAS THE PATIENT CONTACTED THEIR PHARMACY?  Yes  IS THIS A 90 DAY SUPPLY : Yes  IS PATIENT OUT OF MEDICATION: Yes  IF NOT; HOW MUCH IS LEFT:   LAST APPOINTMENT DATE: @9 /12/2022  NEXT APPOINTMENT DATE:@1 /04/2024  DO WE HAVE YOUR PERMISSION TO LEAVE A DETAILED MESSAGE?:Yes  OTHER COMMENTS:    **Let patient know to contact pharmacy at the end of the day to make sure medication is ready. **  ** Please notify patient to allow 48-72 hours to process**  **Encourage patient to contact the pharmacy for refills or they can request refills through [facility]**

## 2023-06-20 NOTE — Telephone Encounter (Signed)
Requested Prescriptions   Signed Prescriptions Disp Refills   tirzepatide (MOUNJARO) 15 MG/0.5ML Pen 9 mL 2    Sig: Inject 15 mg into the skin once a week.    Authorizing Provider: Carlus Pavlov    Ordering User: Pollie Meyer

## 2023-06-24 ENCOUNTER — Ambulatory Visit: Payer: 59 | Attending: Cardiology | Admitting: Student

## 2023-06-24 ENCOUNTER — Ambulatory Visit: Payer: 59 | Admitting: Physical Therapy

## 2023-06-24 DIAGNOSIS — Z01810 Encounter for preprocedural cardiovascular examination: Secondary | ICD-10-CM

## 2023-06-24 NOTE — Progress Notes (Signed)
Virtual Visit via Telephone Note   Because of Dalante Minus co-morbid illnesses, he is at least at moderate risk for complications without adequate follow up.  This format is felt to be most appropriate for this patient at this time.  The patient did not have access to video technology/had technical difficulties with video requiring transitioning to audio format only (telephone).  All issues noted in this document were discussed and addressed.  No physical exam could be performed with this format.  Please refer to the patient's chart for his consent to telehealth for [facility].  Evaluation Performed:  Preoperative cardiovascular risk assessment _____________   Date:  06/24/2023   Patient ID:  Mitchell Palmer, DOB Jan 24, 1966, MRN [id number] Patient Location:  Home Provider location:   Office  Primary Care Provider:  Judy Pimple, MD Primary Cardiologist:  Donato Schultz, MD  Chief Complaint / Patient Profile   57 y.o. y/o male with a h/o hypertension, hyperlipidemia, carotid artery stenosis, OSA, T2DM who is pending right knee scope by Dr. Luiz Blare on 06/26/2023 and presents today for telephonic preoperative cardiovascular risk assessment.  History of Present Illness    Mitchell Palmer is a 57 y.o. male who presents via audio/video conferencing for a telehealth visit today.  Pt was last seen in cardiology clinic on 02/22/2023 by Dr. Anne Fu.  At that time Mitchell Palmer was stable from a cardiac standpoint.  The patient is now pending procedure as outlined above. Since his last visit, he is doing well. Patient denies shortness of breath, dyspnea on exertion, lower extremity edema, orthopnea or PND. No chest pain, pressure, or tightness. No palpitations.  He stays active with work doing a lot of walking and going up and down stairs. He is able to perform light to moderate household chores and yard work.   Past Medical History    Past Medical History:  Diagnosis Date   Allergy     allegic rhinitis   Diabetes mellitus    type II   Esophageal stricture    GERD (gastroesophageal reflux disease)    History of hernia repair    as a baby   Hyperlipidemia    Hyperplastic polyp of intestine 2010   Hypertension    OSA (obstructive sleep apnea) 04/18/2022   Proliferative diabetic retinopathy of left eye (HCC) 03/31/2020   This is active as a patient still has tufts of the anterior segment neovascularization, rubeosis in the left eye every region of capillary nonperfusion should be treated with PRP.   S/P ear surgery, follow-up exam    for drainage   Vitreous hemorrhage of right eye (HCC) 12/24/2019   Past Surgical History:  Procedure Laterality Date   BIOPSY  12/07/2021   Procedure: BIOPSY;  Surgeon: Meryl Dare, MD;  Location: [facility];  Service: Gastroenterology;;   CATARACT EXTRACTION     COLONOSCOPY  03/07/2016   COLONOSCOPY WITH PROPOFOL N/A 12/07/2021   Procedure: COLONOSCOPY WITH PROPOFOL;  Surgeon: Meryl Dare, MD;  Location: [facility];  Service: Gastroenterology;  Laterality: N/A;   ESOPHAGOGASTRODUODENOSCOPY  04/20/2009   stricture/GERD   ESOPHAGOGASTRODUODENOSCOPY (EGD) WITH PROPOFOL N/A 12/07/2021   Procedure: ESOPHAGOGASTRODUODENOSCOPY (EGD) WITH PROPOFOL;  Surgeon: Meryl Dare, MD;  Location: [facility];  Service: Gastroenterology;  Laterality: N/A;   EYE SURGERY     HERNIA REPAIR     age 82-midline   INNER EAR SURGERY  08/20/1986   went in behind rt ear-blockage   KNEE ARTHROSCOPY Right 2023   NECK  SURGERY     [facility]   POLYPECTOMY     SAVORY DILATION N/A 12/07/2021   Procedure: SAVORY DILATION;  Surgeon: Meryl Dare, MD;  Location: [facility];  Service: Gastroenterology;  Laterality: N/A;   SHOULDER ARTHROSCOPY WITH ROTATOR CUFF REPAIR AND SUBACROMIAL DECOMPRESSION Right 09/29/2012   Procedure: SHOULDER ARTHROSCOPY WITH ROTATOR CUFF REPAIR AND SUBACROMIAL DECOMPRESSION;  Surgeon: Mable Paris, MD;   Location: [facility];  Service: Orthopedics;  Laterality: Right;  Right shoulder arthroscopy with subacromial decompression and distal clavicle excision & Debridement of Labrial Tear   SHOULDER SURGERY Left    x2   UPPER GASTROINTESTINAL ENDOSCOPY      Allergies  Allergies  Allergen Reactions   Codeine     Anxious   Lipitor [Atorvastatin]     Muscle pain     Home Medications    Prior to Admission medications   Medication Sig Start Date End Date Taking? Authorizing Provider  B-D UF III MINI PEN NEEDLES 31G X 5 MM MISC USE AS DIRECTED 4 TIMES A DAY 05/09/23   Carlus Pavlov, MD  cetirizine (ZYRTEC) 10 MG tablet Take 10 mg by mouth daily.    [provider]  Cholecalciferol (VITAMIN D) 50 MCG (2000 UT) tablet Take 2,000 Units by mouth daily.    [provider]  Continuous Glucose Sensor (FREESTYLE LIBRE 3 SENSOR) MISC 1 applicator by Does not apply route every 14 (fourteen) days. 06/05/23   Carlus Pavlov, MD  cyclobenzaprine (FLEXERIL) 10 MG tablet Take 1 tablet (10 mg total) by mouth 3 (three) times daily as needed. 11/06/21   Tower, Audrie Gallus, MD  empagliflozin (JARDIANCE) 25 MG TABS tablet TAKE 1 TABLET BY MOUTH EVERY DAY BEFORE BREAKFAST 07/25/22   Carlus Pavlov, MD  fluticasone (FLONASE) 50 MCG/ACT nasal spray PLACE 2 SPRAYS INTO BOTH NOSTRILS DAILY AS NEEDED FOR ALLERGIES OR RHINITIS. 01/21/23   Tower, Audrie Gallus, MD  gabapentin (NEURONTIN) 100 MG capsule Take 1 capsule (100 mg total) by mouth 2 (two) times daily. 06/03/23   Tower, Audrie Gallus, MD  insulin glargine (LANTUS SOLOSTAR) 100 UNIT/ML Solostar Pen INJECT 42 UNITS IN THE MORNING AND 50 UNITS IN THE EVENING 03/21/22   Carlus Pavlov, MD  insulin lispro (HUMALOG KWIKPEN) 100 UNIT/ML KwikPen INJECT 15-25 UNITS INTO THE SKIN 3 TIMES DAILY W/ MEALS AND 10-12 UNITS INTO THE SKIN W/ SNACKS 03/21/22   Carlus Pavlov, MD  levothyroxine (SYNTHROID) 75 MCG tablet TAKE 1 TABLET BY MOUTH DAILY BEFORE  BREAKFAST 04/04/23   Carlus Pavlov, MD  meloxicam (MOBIC) 15 MG tablet Take 1 tablet (15 mg total) by mouth daily. 02/25/23   Louann Sjogren, DPM  olmesartan (BENICAR) 5 MG tablet Take 1 tablet (5 mg total) by mouth daily. 04/22/23   Jake Bathe, MD  omeprazole (PRILOSEC) 40 MG capsule TAKE 1 CAPSULE BY MOUTH EVERY DAY 06/17/23   Tower, Audrie Gallus, MD  [facility] ULTRA test strip USE TO TEST BLOOD SUGAR 4 TIMES DAILY AS DIRECTED 10/05/21   Carlus Pavlov, MD  rosuvastatin (CRESTOR) 10 MG tablet TAKE 1 TABLET BY MOUTH EVERY DAY 06/14/23   Tower, Audrie Gallus, MD  tadalafil (CIALIS) 20 MG tablet Take 1 tablet (20 mg total) by mouth daily as needed for erectile dysfunction. 02/23/22   Tower, Audrie Gallus, MD  tirzepatide [facility]) 15 MG/0.5ML Pen Inject 15 mg into the skin once a week. 06/20/23   Carlus Pavlov, MD  vitamin B-12 (CYANOCOBALAMIN) 500 MCG tablet Take 500 mcg  by mouth daily.    [provider]    Physical Exam    Vital Signs:  Mitchell Palmer does not have vital signs available for review today.  Given telephonic nature of communication, physical exam is limited. AAOx3. NAD. Normal affect.  Speech and respirations are unlabored.  Accessory Clinical Findings    None  Assessment & Plan    Primary Cardiologist: Donato Schultz, MD  Preoperative cardiovascular risk assessment.  Right knee scope by Dr. Luiz Blare on 06/26/2023  Chart reviewed as part of pre-operative protocol coverage. According to the RCRI, patient has a 0.4% risk of MACE. Patient reports activity equivalent to >4.0 METS (does a lot of walking with work going up and down stairs, performs light to moderate household activities and yard work).   Given past medical history and time since last visit, based on ACC/AHA guidelines, Mitchell Palmer would be at acceptable risk for the planned procedure without further cardiovascular testing.   Patient was advised that if he develops new symptoms prior to surgery to contact our  office to arrange a follow-up appointment.  he verbalized understanding.  I will route this recommendation to the requesting party via Epic fax function.  Please call with questions.  Time:   Today, I have spent 5 minutes with the patient with telehealth technology discussing medical history, symptoms, and management plan.     Mitchell Levering, NP  06/24/2023, 7:59 AM

## 2023-06-26 ENCOUNTER — Encounter: Payer: 59 | Admitting: Physical Therapy

## 2023-07-01 ENCOUNTER — Encounter: Payer: 59 | Admitting: Physical Therapy

## 2023-07-03 ENCOUNTER — Encounter: Payer: 59 | Admitting: Physical Therapy

## 2023-07-04 ENCOUNTER — Other Ambulatory Visit: Payer: Self-pay | Admitting: Family Medicine

## 2023-07-09 ENCOUNTER — Ambulatory Visit: Payer: 59 | Admitting: Physical Therapy

## 2023-07-10 ENCOUNTER — Encounter: Payer: Self-pay | Admitting: Physical Therapy

## 2023-07-10 ENCOUNTER — Ambulatory Visit: Payer: 59 | Attending: Podiatry | Admitting: Physical Therapy

## 2023-07-10 ENCOUNTER — Other Ambulatory Visit (HOSPITAL_COMMUNITY): Payer: Self-pay

## 2023-07-10 DIAGNOSIS — M25511 Pain in right shoulder: Secondary | ICD-10-CM | POA: Diagnosis present

## 2023-07-10 DIAGNOSIS — R2689 Other abnormalities of gait and mobility: Secondary | ICD-10-CM | POA: Insufficient documentation

## 2023-07-10 DIAGNOSIS — M6281 Muscle weakness (generalized): Secondary | ICD-10-CM | POA: Diagnosis present

## 2023-07-10 DIAGNOSIS — M25571 Pain in right ankle and joints of right foot: Secondary | ICD-10-CM | POA: Diagnosis present

## 2023-07-10 NOTE — Therapy (Unsigned)
OUTPATIENT PHYSICAL THERAPY TREATMENT / Re-evaluation   Patient Name: Mitchell Palmer MRN: [id number] DOB:Apr 21, 1966, 57 y.o., male Today's Date: 07/10/2023  END OF SESSION:  PT End of Session - 07/10/23 1537     Visit Number 6    Number of Visits 17    Date for PT Re-Evaluation 09/04/23    Authorization Type [employer]    PT Start Time 1330    PT Stop Time 1418    PT Time Calculation (min) 48 min    Activity Tolerance Patient tolerated treatment well;Patient limited by pain                Past Medical History:  Diagnosis Date   Allergy    allegic rhinitis   Diabetes mellitus    type II   Esophageal stricture    GERD (gastroesophageal reflux disease)    History of hernia repair    as a baby   Hyperlipidemia    Hyperplastic polyp of intestine 2010   Hypertension    OSA (obstructive sleep apnea) 04/18/2022   Proliferative diabetic retinopathy of left eye (HCC) 03/31/2020   This is active as a patient still has tufts of the anterior segment neovascularization, rubeosis in the left eye every region of capillary nonperfusion should be treated with PRP.   S/P ear surgery, follow-up exam    for drainage   Vitreous hemorrhage of right eye (HCC) 12/24/2019   Past Surgical History:  Procedure Laterality Date   BIOPSY  12/07/2021   Procedure: BIOPSY;  Surgeon: Meryl Dare, MD;  Location: [facility];  Service: Gastroenterology;;   CATARACT EXTRACTION     COLONOSCOPY  03/07/2016   COLONOSCOPY WITH PROPOFOL N/A 12/07/2021   Procedure: COLONOSCOPY WITH PROPOFOL;  Surgeon: Meryl Dare, MD;  Location: [facility];  Service: Gastroenterology;  Laterality: N/A;   ESOPHAGOGASTRODUODENOSCOPY  04/20/2009   stricture/GERD   ESOPHAGOGASTRODUODENOSCOPY (EGD) WITH PROPOFOL N/A 12/07/2021   Procedure: ESOPHAGOGASTRODUODENOSCOPY (EGD) WITH PROPOFOL;  Surgeon: Meryl Dare, MD;  Location: [facility];  Service: Gastroenterology;  Laterality: N/A;   EYE SURGERY     HERNIA REPAIR      age 37-midline   INNER EAR SURGERY  08/20/1986   went in behind rt ear-blockage   KNEE ARTHROSCOPY Right 2023   NECK SURGERY     [facility]   POLYPECTOMY     SAVORY DILATION N/A 12/07/2021   Procedure: SAVORY DILATION;  Surgeon: Meryl Dare, MD;  Location: [facility];  Service: Gastroenterology;  Laterality: N/A;   SHOULDER ARTHROSCOPY WITH ROTATOR CUFF REPAIR AND SUBACROMIAL DECOMPRESSION Right 09/29/2012   Procedure: SHOULDER ARTHROSCOPY WITH ROTATOR CUFF REPAIR AND SUBACROMIAL DECOMPRESSION;  Surgeon: Mable Paris, MD;  Location: [facility];  Service: Orthopedics;  Laterality: Right;  Right shoulder arthroscopy with subacromial decompression and distal clavicle excision & Debridement of Labrial Tear   SHOULDER SURGERY Left    x2   UPPER GASTROINTESTINAL ENDOSCOPY     Patient Active Problem List   Diagnosis Date Noted   Peripheral neuropathy 02/08/2023   Heel pain, bilateral 02/08/2023   Dyspnea on exertion 07/30/2022   OSA (obstructive sleep apnea) 04/18/2022   Loud snoring 12/12/2021   Family history of colon cancer    Lump on finger 11/06/2021   Pseudophakia, left eye 10/26/2021   Pseudophakia, right eye 10/26/2021   Vitreomacular adhesion of left eye 09/21/2021   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/2021   Vitreous hemorrhage, left eye (HCC) 06/07/2021   Proliferative diabetic  retinopathy of left eye with macular edema associated with type 2 diabetes mellitus (HCC) 06/07/2021   Localized swelling, mass and lump, upper limb 05/31/2021   Low vitamin D level 09/14/2020   Prostate cancer screening 09/02/2020   Current use of proton pump inhibitor 09/02/2020   Family history of factor V Leiden mutation 05/30/2020   Left ankle pain 01/29/2020   Obesity (BMI 30-39.9) 12/24/2019   Proliferative diabetic retinopathy of left eye determined by examination (HCC) 12/24/2019   Nuclear sclerotic cataract of left eye 12/24/2019   Low testosterone  11/13/2019   Fatigue 10/20/2019   Neck pain 04/28/2018   Need for hepatitis B screening test 03/26/2018   Need for hepatitis C screening test 03/26/2018   Paresthesia of both feet 11/01/2017   Hemorrhoids 10/14/2017   Class 2 obesity due to excess calories with body mass index (BMI) of 35.0 to 35.9 in adult 01/24/2016   Hypothyroidism 01/24/2016   Type 2 diabetes mellitus with ophthalmic complication (HCC) 07/28/2015   Alcohol consumption heavy 12/06/2014   Routine general medical examination at a health care facility 09/30/2013   Low back pain 06/17/2012   PLANTAR FASCIITIS, TRAUMATIC 11/09/2009   NEOPLASM UNCERTAIN BEHAVIOR OTHER SPEC SITES 10/07/2009   ESOPHAGEAL STRICTURE 06/27/2009   GERD 03/31/2009   Hyperlipidemia associated with type 2 diabetes mellitus (HCC) 01/06/2009   Essential hypertension 01/06/2009   Allergic rhinitis 01/06/2009    PCP: Judy Pimple, MD  REFERRING PROVIDER: Louann Sjogren, DPM           Shoulder: Jodi Geralds  MD  REFERRING DIAG: Plantar fasciitis, right [M72.2]         S/p R knee Arthroscopy      THERAPY DIAG:  Pain in right ankle and joints of right foot  Muscle weakness (generalized)  Other abnormalities of gait and mobility  Acute pain of right shoulder  Rationale for Evaluation and Treatment: Rehabilitation  ONSET DATE: 6 months for R plantar fasciitis         06/26/2023 R Knee arthroscopy         SUBJECTIVE:   SUBJECTIVE STATEMENT: Patient had R knee Arthoscopy on 06/26/2023 and reports overall doing well but does have stiffness and pain in the R knee. Pain is a 7/10 and stays in the front of the knee, along the the portals and under the knee cap. Overall he reports it is better than it was before the surgery.   PERTINENT HISTORY: See PMHx  PAIN: R knee  Are you having pain? Yes: NPRS scale: 7/10 Pain location: Lateral aspect of the knee Pain description: sharp/ aching Aggravating factors: getting out of bed in the AM,  getting out of chair initially Relieving factors:  N/A   R PF Are you having pain? Yes: NPRS scale: 2/10 Pain location: R heel  Pain description: sharpness Aggravating factors: getting out of bed in the morning Relieving factors: ankle brace, stretch    PRECAUTIONS: None  RED FLAGS: None   WEIGHT BEARING RESTRICTIONS: No  FALLS:  Has patient fallen in last 6 months? No  LIVING ENVIRONMENT: Lives with: lives with their family Lives in: House/apartment Stairs: Yes: External: 3 steps; none Has following equipment at home:  Brace  OCCUPATION: city of [place],  PLOF: Independent with basic ADLs  PATIENT GOALS: stretching, relieving pain.    OBJECTIVE:  Note: Objective measures were completed at Evaluation unless otherwise noted.  DIAGNOSTIC FINDINGS:at MD's office.   PATIENT SURVEYS:  FOTO 65%  predicted 70%  11/20/224 knee 54% predicted 70%  COGNITION: Overall cognitive status: Within functional limits for tasks assessed     SENSATION: WFL  POSTURE: rounded shoulders and forward head  PALPATION: TTP along the  bil with R>L calf/ gastroc and posterior tibialis and at the R medial calcaneal tubercle.   R Knee 07/10/23 Pocket of swelling superolaterlal aspect of the lateral joint of the femur. And TTP along the anterior aspect of the knee.    R knee Edema joint line:39.5 mm  L knee edema joint line: 37 mm  LOWER EXTREMITY ROM:     Active  Right Re-eval Left Re-evaleval  Hip flexion    Hip extension    Hip abduction    Hip adduction    Hip internal rotation    Hip external rotation    Knee flexion 112*   Knee extension 8 *   Ankle dorsiflexion    Ankle plantarflexion    Ankle inversion    Ankle eversion     (Blank rows = not tested)    Notes: Pain and Stiffness at end range flexion,    LOWER EXTREMITY MMT:  MMT Right eval Left eval 07/10/2023 Re-eval 07/10/2023 Re-eval  Hip flexion   4+ 4+  Hip extension   4+ 4+  Hip abduction    4+ 4+  Hip adduction      Hip internal rotation      Hip external rotation      Knee flexion   4-* 4+  Knee extension   4-* 4+  Ankle dorsiflexion 4+ P! 5    Ankle plantarflexion 5 5    Ankle inversion 4+ P! 5    Ankle eversion 3+  5     (Blank rows = not tested)       Notes: Pain with testing with weakness with R knee extension/   GAIT: Distance walked: 120 from waiting room to tx area Assistive device utilized: None Level of assistance: Complete Independence Comments: antalgic gait pattern with limited heel strike on the R compared bil   TREATMENT:                                                                                                                               [facility] Adult PT Treatment:                                                DATE: 07/10/2023 Therapeutic Exercise: Heel slides 1 x 5 Thomas test quad / hip flexor stretch 1 x 30 sec  Updated HEP for knee   [facility] Adult PT Treatment:  DATE: 06/20/2023 Therapeutic Exercise: Standing calf stretch 2 x 30 sec with knee straight/ bent on slant board Seated ankle DF R 2 x 15 with Blue theraband Manual Therapy: IASTM along the R gastroc/ soleues Twisting over the medial calcaneal tubercle Talocrural grade V bil with cavitation noted  Treatment instructions: Expect mild to moderate muscle soreness. S/S of pneumothorax if dry needled over a lung field, and to seek immediate medical attention should they occur. Patient verbalized understanding of these instructions and education.  Patient Consent Given: Yes Education handout provided: Yes Muscles treated: R gastroc/ soleus (medial aspect) Electrical stimulation performed: Yes Parameters: CPS L 20 x 10 min adjusting to tolerance Treatment response/outcome: twitch response , relief of tension noted   [facility] Adult PT Treatment:                                                DATE: 06/17/23 Therapeutic Exercise: Standing  slant board calf stretch knee straight/ bent 2 x 30 sec ea.  Standing heel raise with ball between ankles 1 x 12, progressed to doing on slant board 1 x 12 Seated hip ER 2 x 12 with ball between knees RLE with RTB  Updated HEP for R hip ER Manual Therapy: IASTM along the R gastroc/ soleues Twisting over the medial calcaneal tubercle  Trigger Point Dry-Needling  Treatment instructions: Expect mild to moderate muscle soreness. S/S of pneumothorax if dry needled over a lung field, and to seek immediate medical attention should they occur. Patient verbalized understanding of these instructions and education.  Patient Consent Given: Yes Education handout provided: Yes Muscles treated: R gastroc/ soleus (medial aspect) Electrical stimulation performed: Yes Parameters:  CPS L 20 x 10 min adjusting to tolerance Treatment response/outcome: twitch response , relief of tension noted  PATIENT EDUCATION: 07/10/2023 Education details: Evaluation findings, POC, goals, HEP with proper form/ rationale. Person educated: Patient Education method: Explanation, Demonstration, Verbal cues, and Handouts Education comprehension: verbalized understanding  HOME EXERCISE PROGRAM: Access Code: [email or web address] URL: [email or web address] Date: 06/17/2023 Prepared by: [name]  Plantar Fascia Exercises  - Gastroc Stretch with Foot at Wall  - 3 x daily - 7 x weekly - 1-2 sets - 1-2 reps - 30-60 seconds hold - Soleus Stretch with Foot at Wall  - 3 x daily - 7 x weekly - 1 -2 sets - 1 - 2 reps - 30- 60 seconds hold - Seated Ankle Eversion with Resistance  - 1 x daily - 7 x weekly - 2 sets - 10 reps - Seated Calf Stretch with Strap  - 1 x daily - 7 x weekly - 1 - 2 sets - 2 reps - 30 - 60 seconds hold - Seated Ankle Dorsiflexion with Resistance  - 1 x daily - 7 x weekly - 2 sets - 10 reps - Seated Ankle Inversion with Resistance  - 1 x daily - 7 x weekly - 2 sets - 10 reps - Seated Heel Raise  -  1 x daily - 7 x weekly - 2 sets - 15 reps - Gastroc Stretch on Wall  - 1 x daily - 7 x weekly - 1 sets - 3 reps - 20 hold - Standing Soleus Stretch  - 1 x daily - 7 x weekly - 1 sets - 3 reps - 20 hold - Seated Hip External Rotation with Resistance  -  1 x daily - 7 x weekly - 2 sets - 12 reps  Knee:  Access Code: [email or web address] URL: [email or web address] Date: 07/10/2023 Prepared by: [name]  Exercises - Supine Heel Slides  - 1 x daily - 7 x weekly - 2 sets - 10 reps - Supine Quad Set  - 1 x daily - 7 x weekly - 2 sets - 10 reps - SLR (Mirrored)  - 1 x daily - 7 x weekly - 2 sets - 10 reps - 1 hold - Supine Bridge  - 1 x daily - 7 x weekly - 2 sets - 10 reps - Modified Thomas Stretch  - 1 x daily - 7 x weekly - 2 sets - 2 reps - 30 seconds hold ASSESSMENT:  CLINICAL IMPRESSION: Re-evaluation adding R Knee  Pt arrives to PT s/p R knee scope on 11/6. Since surgery he reports overall doing well, but does have pain and swelling which most noticeably is occurring along the anterolateral aspect of the R knee. He demonstrates limited knee ROM and weakness as a result of pain and stiffness. He is TTPD and the anterior aspect of the knee. He is going to the MD tomorrow to get fluid pulled of his knee, per the pt report. He would benefit from physical therapy to reduce  R knee pain, improve ROM and strength, and maximize his function by addressing the deficits listed.     R PF  EVALUATION: Patient is a 56 y.o. M who was seen today for physical therapy evaluation and treatment for dx of R Plantar fasciitis. Pt presents with R heel pain that started insidisously 6 months ago.  He has functional ankle ROM compared bil with weakness in R ankle DF/ inversion/ eversion with pain during assessment. TTP along bil gastroc/ soleus and posterior tib with R worse than left and specific point tenderness along the medial calcaneal tubercle. Patient responded well in session with MTPR along the R  calf and posterior tib, followed with stretching calf strengthening. He noted relief in the calf and in the heel end of the session. Pt would benefit from physical therapy to decrease R heel pain, reduce calf tightness, improve gross LE strength, and maximize gait efficiency and overall function by addressing the deficits listed.   OBJECTIVE IMPAIRMENTS: Abnormal gait, decreased activity tolerance, decreased endurance, decreased strength, increased fascial restrictions, increased muscle spasms, improper body mechanics, postural dysfunction, and pain.   ACTIVITY LIMITATIONS: lifting, standing, squatting, and locomotion level  PARTICIPATION LIMITATIONS: community activity, occupation, and yard work  PERSONAL FACTORS: Age, Past/current experiences, and 1-2 comorbidities: hx of L PF tear, R knee surgery  are also affecting patient's functional outcome.   REHAB POTENTIAL: Good  CLINICAL DECISION MAKING: Evolving/moderate complexity  EVALUATION COMPLEXITY: Moderate   GOALS: Goals reviewed with patient? Yes  SHORT TERM GOALS: Target date: 07/01/2023   PT to be IND with initial HEP for therapeutic progression Baseline: Goal status: MET 07/10/2023  2.  Pt to report pain to </= 7/10 max with getting out of bed in the AM for improvement in condition Baseline:  Goal status: Met 07/10/2023  3.  Pt to verbalize/ demo efficient gait pattern utilizing heel strike/ toe off with </= 5/10 max pain noted during gait. Baseline:  Goal status: MET 07/10/2023   LONG TERM GOALS: Target date: updated to 09/04/2023  Increase R calf gross strength to >/= 4+/5 to promote ankle strength/ stability with walking/ standing Baseline:  Goal status: ongoing 07/10/2023  2.  Pt  to report max pain in the morning to </= 3/10 and during the day to </=1/10 pain for improvement in condition.  Baseline:  Goal status: ongoing 07/10/2023  3.  Improve FOTO score to >/= 70% to demo improvement in function Baseline:   Goal status: updated 11/20/204  4.  Pt to be able to sit/ stand and walk  and perform work related activities without limitation. Baseline:  Goal status: ongoing 07/10/2023  5.  Pt to be IND with all HEP and will is able to maintain and progress their current level of function IND. Baseline:  Goal status: ongoing 07/10/2023    6.  Increase knee total arc ROM to >/= 4 - 118 degrees for function ROM required for mobility for ADLS Baseline:  Goal status: INITIAL    7.  Increase R knee strength to >/= 4+/5 to promote stability with lifting and ADLS.  Baseline:  Goal status: INITIAL  8.  Pt be able to sit, stand and walk for >/= 1 hour with </= 2/10 max pain for functional endurance required for ADLS and work related activities  Baseline:  Goal status: INITIAL  PLAN:  PT FREQUENCY: 1-2x/week  PT DURATION: 8 weeks  PLANNED INTERVENTIONS: 97110-Therapeutic exercises, 97530- Therapeutic activity, [id number]- Neuromuscular re-education, 97535- Self Care, [id number]- Manual therapy, [id number]- Gait training, [id number]- Ultrasound, [id number]- Ionotophoresis 4mg /ml Dexamethasone, Taping, Dry Needling, Joint mobilization, Joint manipulation, Cryotherapy, and Moist heat  PLAN FOR NEXT SESSION: Review/ update HEP PRN. STW along the R gastroc/ soleus, consider DN if pt is up for it. Foot intrinsic strengthening, ankle extrinsic strengtheneing, calf stretching.  Knee ROM, STrengthening,  Ainslee Sou PT, DPT, LAT, ATC  07/10/23  3:41 PM

## 2023-07-11 ENCOUNTER — Telehealth: Payer: Self-pay

## 2023-07-11 ENCOUNTER — Other Ambulatory Visit (HOSPITAL_COMMUNITY): Payer: Self-pay

## 2023-07-11 NOTE — Telephone Encounter (Signed)
Pharmacy Patient Advocate Encounter   Received notification from CoverMyMeds that prior authorization for [facility] is required/requested.   Insurance verification completed.   The patient is insured through [facility] .   Per test claim: PA required; PA submitted to above mentioned insurance via CoverMyMeds Key/confirmation #/EOC [id number] Status is pending

## 2023-07-15 ENCOUNTER — Ambulatory Visit: Payer: 59 | Admitting: Physical Therapy

## 2023-07-15 DIAGNOSIS — M25511 Pain in right shoulder: Secondary | ICD-10-CM

## 2023-07-15 DIAGNOSIS — M25571 Pain in right ankle and joints of right foot: Secondary | ICD-10-CM | POA: Diagnosis not present

## 2023-07-15 DIAGNOSIS — R2689 Other abnormalities of gait and mobility: Secondary | ICD-10-CM

## 2023-07-15 DIAGNOSIS — M6281 Muscle weakness (generalized): Secondary | ICD-10-CM

## 2023-07-15 NOTE — Therapy (Signed)
OUTPATIENT PHYSICAL THERAPY TREATMENT   Patient Name: Mitchell Palmer MRN: [id number] DOB:December 13, 1965, 57 y.o., male Today's Date: 07/15/2023  END OF SESSION:  PT End of Session - 07/15/23 1327     Visit Number 7    Number of Visits 17    Date for PT Re-Evaluation 09/04/23    PT Start Time 1325    PT Stop Time 1416    PT Time Calculation (min) 51 min    Activity Tolerance Patient tolerated treatment well;Patient limited by pain                 Past Medical History:  Diagnosis Date   Allergy    allegic rhinitis   Diabetes mellitus    type II   Esophageal stricture    GERD (gastroesophageal reflux disease)    History of hernia repair    as a baby   Hyperlipidemia    Hyperplastic polyp of intestine 2010   Hypertension    OSA (obstructive sleep apnea) 04/18/2022   Proliferative diabetic retinopathy of left eye (HCC) 03/31/2020   This is active as a patient still has tufts of the anterior segment neovascularization, rubeosis in the left eye every region of capillary nonperfusion should be treated with PRP.   S/P ear surgery, follow-up exam    for drainage   Vitreous hemorrhage of right eye (HCC) 12/24/2019   Past Surgical History:  Procedure Laterality Date   BIOPSY  12/07/2021   Procedure: BIOPSY;  Surgeon: Meryl Dare, MD;  Location: [facility];  Service: Gastroenterology;;   CATARACT EXTRACTION     COLONOSCOPY  03/07/2016   COLONOSCOPY WITH PROPOFOL N/A 12/07/2021   Procedure: COLONOSCOPY WITH PROPOFOL;  Surgeon: Meryl Dare, MD;  Location: [facility];  Service: Gastroenterology;  Laterality: N/A;   ESOPHAGOGASTRODUODENOSCOPY  04/20/2009   stricture/GERD   ESOPHAGOGASTRODUODENOSCOPY (EGD) WITH PROPOFOL N/A 12/07/2021   Procedure: ESOPHAGOGASTRODUODENOSCOPY (EGD) WITH PROPOFOL;  Surgeon: Meryl Dare, MD;  Location: [facility];  Service: Gastroenterology;  Laterality: N/A;   EYE SURGERY     HERNIA REPAIR     age 80-midline   INNER EAR SURGERY   08/20/1986   went in behind rt ear-blockage   KNEE ARTHROSCOPY Right 2023   NECK SURGERY     [facility]   POLYPECTOMY     SAVORY DILATION N/A 12/07/2021   Procedure: SAVORY DILATION;  Surgeon: Meryl Dare, MD;  Location: [facility];  Service: Gastroenterology;  Laterality: N/A;   SHOULDER ARTHROSCOPY WITH ROTATOR CUFF REPAIR AND SUBACROMIAL DECOMPRESSION Right 09/29/2012   Procedure: SHOULDER ARTHROSCOPY WITH ROTATOR CUFF REPAIR AND SUBACROMIAL DECOMPRESSION;  Surgeon: Mable Paris, MD;  Location: [facility];  Service: Orthopedics;  Laterality: Right;  Right shoulder arthroscopy with subacromial decompression and distal clavicle excision & Debridement of Labrial Tear   SHOULDER SURGERY Left    x2   UPPER GASTROINTESTINAL ENDOSCOPY     Patient Active Problem List   Diagnosis Date Noted   Peripheral neuropathy 02/08/2023   Heel pain, bilateral 02/08/2023   Dyspnea on exertion 07/30/2022   OSA (obstructive sleep apnea) 04/18/2022   Loud snoring 12/12/2021   Family history of colon cancer    Lump on finger 11/06/2021   Pseudophakia, left eye 10/26/2021   Pseudophakia, right eye 10/26/2021   Vitreomacular adhesion of left eye 09/21/2021   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/2021   Vitreous hemorrhage, left eye (HCC) 06/07/2021   Proliferative diabetic retinopathy of left eye with macular edema  associated with type 2 diabetes mellitus (HCC) 06/07/2021   Localized swelling, mass and lump, upper limb 05/31/2021   Low vitamin D level 09/14/2020   Prostate cancer screening 09/02/2020   Current use of proton pump inhibitor 09/02/2020   Family history of factor V Leiden mutation 05/30/2020   Left ankle pain 01/29/2020   Obesity (BMI 30-39.9) 12/24/2019   Proliferative diabetic retinopathy of left eye determined by examination (HCC) 12/24/2019   Nuclear sclerotic cataract of left eye 12/24/2019   Low testosterone 11/13/2019   Fatigue 10/20/2019   Neck  pain 04/28/2018   Need for hepatitis B screening test 03/26/2018   Need for hepatitis C screening test 03/26/2018   Paresthesia of both feet 11/01/2017   Hemorrhoids 10/14/2017   Class 2 obesity due to excess calories with body mass index (BMI) of 35.0 to 35.9 in adult 01/24/2016   Hypothyroidism 01/24/2016   Type 2 diabetes mellitus with ophthalmic complication (HCC) 07/28/2015   Alcohol consumption heavy 12/06/2014   Routine general medical examination at a health care facility 09/30/2013   Low back pain 06/17/2012   PLANTAR FASCIITIS, TRAUMATIC 11/09/2009   NEOPLASM UNCERTAIN BEHAVIOR OTHER SPEC SITES 10/07/2009   ESOPHAGEAL STRICTURE 06/27/2009   GERD 03/31/2009   Hyperlipidemia associated with type 2 diabetes mellitus (HCC) 01/06/2009   Essential hypertension 01/06/2009   Allergic rhinitis 01/06/2009    PCP: Judy Pimple, MD  REFERRING PROVIDER: Louann Sjogren, DPM           Shoulder: Jodi Geralds  MD  REFERRING DIAG: Plantar fasciitis, right [M72.2]         S/p R knee Arthroscopy      THERAPY DIAG:  Pain in right ankle and joints of right foot  Muscle weakness (generalized)  Acute pain of right shoulder  Other abnormalities of gait and mobility  Rationale for Evaluation and Treatment: Rehabilitation  ONSET DATE: 6 months for R plantar fasciitis         06/26/2023 R Knee arthroscopy         SUBJECTIVE:   SUBJECTIVE STATEMENT: " I am doing pretty good, had the knee drained and it helped."   PERTINENT HISTORY: See PMHx  PAIN: R knee  Are you having pain? Yes: NPRS scale: 4/10 Pain location: Lateral aspect of the knee Pain description: sharp/ aching Aggravating factors: getting out of bed in the AM, getting out of chair initially Relieving factors:  N/A   R PF Are you having pain? Yes: NPRS scale: 4/10 Pain location: R heel  Pain description: sharpness Aggravating factors: getting out of bed in the morning Relieving factors: ankle brace, stretch     PRECAUTIONS: None  RED FLAGS: None   WEIGHT BEARING RESTRICTIONS: No  FALLS:  Has patient fallen in last 6 months? No  LIVING ENVIRONMENT: Lives with: lives with their family Lives in: House/apartment Stairs: Yes: External: 3 steps; none Has following equipment at home:  Brace  OCCUPATION: city [employer] [place],  PLOF: Independent with basic ADLs  PATIENT GOALS: stretching, relieving pain.    OBJECTIVE:  Note: Objective measures were completed at Evaluation unless otherwise noted.  DIAGNOSTIC FINDINGS:at MD's office.   PATIENT SURVEYS:  FOTO 65%  predicted 70%  11/20/224 knee 54% predicted 70%  COGNITION: Overall cognitive status: Within functional limits for tasks assessed     SENSATION: WFL  POSTURE: rounded shoulders and forward head  PALPATION: TTP along the  bil with R>L calf/ gastroc and posterior tibialis and at the R medial calcaneal  tubercle.   R Knee 07/10/23 Pocket of swelling superolaterlal aspect of the lateral joint of the femur. And TTP along the anterior aspect of the knee.    R knee Edema joint line:39.5 mm  L knee edema joint line: 37 mm  LOWER EXTREMITY ROM:     Active  Right Re-eval Left Re-eval Right 07/15/2023  Hip flexion     Hip extension     Hip abduction     Hip adduction     Hip internal rotation     Hip external rotation     Knee flexion 112*  118  Knee extension 8 *  6  Ankle dorsiflexion     Ankle plantarflexion     Ankle inversion     Ankle eversion      (Blank rows = not tested)    Notes: Pain and Stiffness at end range flexion,    LOWER EXTREMITY MMT:  MMT Right eval Left eval 07/10/2023 Re-eval 07/10/2023 Re-eval  Hip flexion   4+ 4+  Hip extension   4+ 4+  Hip abduction   4+ 4+  Hip adduction      Hip internal rotation      Hip external rotation      Knee flexion   4-* 4+  Knee extension   4-* 4+  Ankle dorsiflexion 4+ P! 5    Ankle plantarflexion 5 5    Ankle inversion 4+ P! 5    Ankle  eversion 3+  5     (Blank rows = not tested)       Notes: Pain with testing with weakness with R knee extension/   GAIT: Distance walked: 120 from waiting room to tx area Assistive device utilized: None Level of assistance: Complete Independence Comments: antalgic gait pattern with limited heel strike on the R compared bil   TREATMENT:                                                                                                                               [facility] Adult PT Treatment:                                                DATE: 07/15/2023 Therapeutic Exercise: Recumbent bike L 1 x 5 min  Quad set 1 x 10 holding 5 seconds (verbal cues to keep up towards the ceiling) RL SLR 2 x 10 Bridge 2 x 10 with knees bent equally.  LAQ with ballsqueeze 2 x 10 for VMO activation Sit to stand 2 x 10  Seated hamstring curling 2 x 10 with GTB Sit to stand 1 x 10  Manual Therapy: MTPR the vastus lateralis x 3 Patellar mobs and how to perform at home Modalities: Ice pack in supine with elevation x 10 min   [facility] Adult PT  Treatment:                                                DATE: 07/10/2023 Therapeutic Exercise: Heel slides 1 x 5 Thomas test quad / hip flexor stretch 1 x 30 sec  Updated HEP for knee  [facility] Adult PT Treatment:                                                DATE: 06/20/2023 Therapeutic Exercise: Standing calf stretch 2 x 30 sec with knee straight/ bent on slant board Seated ankle DF R 2 x 15 with Blue theraband Manual Therapy: IASTM along the R gastroc/ soleues Twisting over the medial calcaneal tubercle Talocrural grade V bil with cavitation noted  Treatment instructions: Expect mild to moderate muscle soreness. S/S of pneumothorax if dry needled over a lung field, and to seek immediate medical attention should they occur. Patient verbalized understanding of these instructions and education.  Patient Consent Given: Yes Education handout provided: Yes Muscles  treated: R gastroc/ soleus (medial aspect) Electrical stimulation performed: Yes Parameters: CPS L 20 x 10 min adjusting to tolerance Treatment response/outcome: twitch response , relief of tension noted   PATIENT EDUCATION: 07/10/2023 Education details: Evaluation findings, POC, goals, HEP with proper form/ rationale. Person educated: Patient Education method: Explanation, Demonstration, Verbal cues, and Handouts Education comprehension: verbalized understanding  HOME EXERCISE PROGRAM: Access Code: [email or web address] URL: [email or web address] Date: 06/17/2023 Prepared by: [name]  Plantar Fascia Exercises  - Gastroc Stretch with Foot at Wall  - 3 x daily - 7 x weekly - 1-2 sets - 1-2 reps - 30-60 seconds hold - Soleus Stretch with Foot at Wall  - 3 x daily - 7 x weekly - 1 -2 sets - 1 - 2 reps - 30- 60 seconds hold - Seated Ankle Eversion with Resistance  - 1 x daily - 7 x weekly - 2 sets - 10 reps - Seated Calf Stretch with Strap  - 1 x daily - 7 x weekly - 1 - 2 sets - 2 reps - 30 - 60 seconds hold - Seated Ankle Dorsiflexion with Resistance  - 1 x daily - 7 x weekly - 2 sets - 10 reps - Seated Ankle Inversion with Resistance  - 1 x daily - 7 x weekly - 2 sets - 10 reps - Seated Heel Raise  - 1 x daily - 7 x weekly - 2 sets - 15 reps - Gastroc Stretch on Wall  - 1 x daily - 7 x weekly - 1 sets - 3 reps - 20 hold - Standing Soleus Stretch  - 1 x daily - 7 x weekly - 1 sets - 3 reps - 20 hold - Seated Hip External Rotation with Resistance  - 1 x daily - 7 x weekly - 2 sets - 12 reps  Knee:  Access Code: [email or web address] URL: [email or web address] Date: 07/10/2023 Prepared by: [name]  Exercises - Supine Heel Slides  - 1 x daily - 7 x weekly - 2 sets - 10 reps - Supine Quad Set  - 1 x daily - 7 x weekly - 2 sets - 10 reps - SLR (Mirrored)  - 1 x daily - 7 x  weekly - 2 sets - 10 reps - 1 hold - Supine Bridge  - 1 x daily - 7 x weekly - 2 sets -  10 reps - Modified Thomas Stretch  - 1 x daily - 7 x weekly - 2 sets - 2 reps - 30 seconds hold ASSESSMENT:  CLINICAL IMPRESSION: 11/25/2024David arrives to session noting improvement in pain and notes he had his knee drained last week. He demonstrates improvement in ROM with total arc being 6 - 118 which. Cotninued working on knee ROM via patellar mobs and gross knee/ hp strengthening. He did very well with strengthening overall but reports some soreness in the knee, and continued palpable swelling. Utilized ice pack end of session to calm soreness.   Re-evaluation adding R Knee  Pt arrives to PT s/p R knee scope on 11/6. Since surgery he reports overall doing well, but does have pain and swelling which most noticeably is occurring along the anterolateral aspect of the R knee. He demonstrates limited knee ROM and weakness as a result of pain and stiffness. He is TTPD and the anterior aspect of the knee. He is going to the MD tomorrow to get fluid pulled of his knee, per the pt report. He would benefit from physical therapy to reduce  R knee pain, improve ROM and strength, and maximize his function by addressing the deficits listed.    R PF  EVALUATION: Patient is a 57 y.o. M who was seen today for physical therapy evaluation and treatment for dx of R Plantar fasciitis. Pt presents with R heel pain that started insidisously 6 months ago.  He has functional ankle ROM compared bil with weakness in R ankle DF/ inversion/ eversion with pain during assessment. TTP along bil gastroc/ soleus and posterior tib with R worse than left and specific point tenderness along the medial calcaneal tubercle. Patient responded well in session with MTPR along the R calf and posterior tib, followed with stretching calf strengthening. He noted relief in the calf and in the heel end of the session. Pt would benefit from physical therapy to decrease R heel pain, reduce calf tightness, improve gross LE strength, and maximize  gait efficiency and overall function by addressing the deficits listed.   OBJECTIVE IMPAIRMENTS: Abnormal gait, decreased activity tolerance, decreased endurance, decreased strength, increased fascial restrictions, increased muscle spasms, improper body mechanics, postural dysfunction, and pain.   ACTIVITY LIMITATIONS: lifting, standing, squatting, and locomotion level  PARTICIPATION LIMITATIONS: community activity, occupation, and yard work  PERSONAL FACTORS: Age, Past/current experiences, and 1-2 comorbidities: hx of L PF tear, R knee surgery  are also affecting patient's functional outcome.   REHAB POTENTIAL: Good  CLINICAL DECISION MAKING: Evolving/moderate complexity  EVALUATION COMPLEXITY: Moderate   GOALS: Goals reviewed with patient? Yes  SHORT TERM GOALS: Target date: 07/01/2023   PT to be IND with initial HEP for therapeutic progression Baseline: Goal status: MET 07/10/2023  2.  Pt to report pain to </= 7/10 max with getting out of bed in the AM for improvement in condition Baseline:  Goal status: Met 07/10/2023  3.  Pt to verbalize/ demo efficient gait pattern utilizing heel strike/ toe off with </= 5/10 max pain noted during gait. Baseline:  Goal status: MET 07/10/2023   LONG TERM GOALS: Target date: updated to 09/04/2023  Increase R calf gross strength to >/= 4+/5 to promote ankle strength/ stability with walking/ standing Baseline:  Goal status: ongoing 07/10/2023  2.  Pt to report max pain in the  morning to </= 3/10 and during the day to </=1/10 pain for improvement in condition.  Baseline:  Goal status: ongoing 07/10/2023  3.  Improve FOTO score to >/= 70% to demo improvement in function Baseline:  Goal status: updated 11/20/204  4.  Pt to be able to sit/ stand and walk  and perform work related activities without limitation. Baseline:  Goal status: ongoing 07/10/2023  5.  Pt to be IND with all HEP and will is able to maintain and progress their  current level of function IND. Baseline:  Goal status: ongoing 07/10/2023    6.  Increase knee total arc ROM to >/= 4 - 118 degrees for function ROM required for mobility for ADLS Baseline:  Goal status: INITIAL    7.  Increase R knee strength to >/= 4+/5 to promote stability with lifting and ADLS.  Baseline:  Goal status: INITIAL  8.  Pt be able to sit, stand and walk for >/= 1 hour with </= 2/10 max pain for functional endurance required for ADLS and work related activities  Baseline:  Goal status: INITIAL  PLAN:  PT FREQUENCY: 1-2x/week  PT DURATION: 8 weeks  PLANNED INTERVENTIONS: 97110-Therapeutic exercises, 97530- Therapeutic activity, [id number]- Neuromuscular re-education, 97535- Self Care, [id number]- Manual therapy, [id number]- Gait training, [id number]- Ultrasound, 13244- Ionotophoresis 4mg /ml Dexamethasone, Taping, Dry Needling, Joint mobilization, Joint manipulation, Cryotherapy, and Moist heat  PLAN FOR NEXT SESSION: Review/ update HEP PRN. STW along the R gastroc/ soleus, consider DN if pt is up for it. Foot intrinsic strengthening, ankle extrinsic strengtheneing, calf stretching.  Knee ROM, STrengthening,  Jushua Waltman PT, DPT, LAT, ATC  07/15/23  2:48 PM

## 2023-07-15 NOTE — Telephone Encounter (Signed)
Pharmacy Patient Advocate Encounter  Received notification from [facility] that Prior Authorization for Mitchell Palmer has been APPROVED from 07/11/23 to 07/10/24   PA #/Case ID/Reference #: [id number]

## 2023-07-17 ENCOUNTER — Other Ambulatory Visit: Payer: Self-pay | Admitting: Internal Medicine

## 2023-07-17 DIAGNOSIS — E11319 Type 2 diabetes mellitus with unspecified diabetic retinopathy without macular edema: Secondary | ICD-10-CM

## 2023-07-22 ENCOUNTER — Ambulatory Visit: Payer: 59 | Attending: Podiatry | Admitting: Physical Therapy

## 2023-07-22 ENCOUNTER — Ambulatory Visit (INDEPENDENT_AMBULATORY_CARE_PROVIDER_SITE_OTHER): Payer: 59 | Admitting: Podiatry

## 2023-07-22 ENCOUNTER — Encounter: Payer: Self-pay | Admitting: Podiatry

## 2023-07-22 ENCOUNTER — Encounter: Payer: Self-pay | Admitting: Physical Therapy

## 2023-07-22 DIAGNOSIS — G6289 Other specified polyneuropathies: Secondary | ICD-10-CM

## 2023-07-22 DIAGNOSIS — R2689 Other abnormalities of gait and mobility: Secondary | ICD-10-CM | POA: Insufficient documentation

## 2023-07-22 DIAGNOSIS — E785 Hyperlipidemia, unspecified: Secondary | ICD-10-CM

## 2023-07-22 DIAGNOSIS — M722 Plantar fascial fibromatosis: Secondary | ICD-10-CM | POA: Diagnosis not present

## 2023-07-22 DIAGNOSIS — M5416 Radiculopathy, lumbar region: Secondary | ICD-10-CM | POA: Diagnosis not present

## 2023-07-22 DIAGNOSIS — M6281 Muscle weakness (generalized): Secondary | ICD-10-CM | POA: Diagnosis present

## 2023-07-22 DIAGNOSIS — M25511 Pain in right shoulder: Secondary | ICD-10-CM | POA: Insufficient documentation

## 2023-07-22 DIAGNOSIS — E1169 Type 2 diabetes mellitus with other specified complication: Secondary | ICD-10-CM | POA: Diagnosis not present

## 2023-07-22 DIAGNOSIS — M25571 Pain in right ankle and joints of right foot: Secondary | ICD-10-CM | POA: Insufficient documentation

## 2023-07-22 NOTE — Progress Notes (Signed)
  Subjective:  Patient ID: Mitchell Palmer, male    DOB: 1966-06-24,   MRN: [id number]  No chief complaint on file.   57 y.o. male presents for follow-up of plantar fasciitis on the right and numbness in the feet. Relates injection did help last time and was about 90% better. He did miss some PT because of knee surgery and feels that set him back a bit. Relates more pain from burning tingling and numbness starting in his knees and going down to his fee. Currently taking 200 mg gabapentin daily.   Denies any other pedal complaints. Denies n/v/f/c.   Past Medical History:  Diagnosis Date   Allergy    allegic rhinitis   Diabetes mellitus    type II   Esophageal stricture    GERD (gastroesophageal reflux disease)    History of hernia repair    as a baby   Hyperlipidemia    Hyperplastic polyp of intestine 2010   Hypertension    OSA (obstructive sleep apnea) 04/18/2022   Proliferative diabetic retinopathy of left eye (HCC) 03/31/2020   This is active as a patient still has tufts of the anterior segment neovascularization, rubeosis in the left eye every region of capillary nonperfusion should be treated with PRP.   S/P ear surgery, follow-up exam    for drainage   Vitreous hemorrhage of right eye (HCC) 12/24/2019    Objective:  Physical Exam: Vascular: DP/PT pulses 2/4 bilateral. CFT <3 seconds. Normal hair growth on digits. No edema.  Skin. No lacerations or abrasions bilateral feet.  Musculoskeletal: MMT 5/5 bilateral lower extremities in DF, PF, Inversion and Eversion. Deceased ROM of the ankle. Tender to medial calcaneal tubercle mild.  Neurological: Sensation intact to light touch. Relates burning and stinlign pains.   Assessment:   1. Plantar fasciitis, right   2. Other polyneuropathy   3. Lumbar radiculopathy   4. Hyperlipidemia associated with type 2 diabetes mellitus (HCC)         Plan:  Patient was evaluated and treated and all questions answered. Discussed plantar  fasciitis with patient. Discussed neruopathy and radiculopathy as well.  Reviewed notes from PCP.  Continue gabapentin.  X-rays reviewed and discussed with patient. No acute fractures or dislocations noted. Mild spurring noted at inferior calcaneus.  Discussed treatment options including, ice, NSAIDS, supportive shoes, bracing, and stretching. Continue stretching and brace.  Continue PT  -Discussed neuropathy and radicular pain. Referral to neurology placed to determine cause possible NCV/EMG study.  Follow-up 3 months for diabetic foot care/ pf         Louann Sjogren, DPM

## 2023-07-22 NOTE — Therapy (Addendum)
OUTPATIENT PHYSICAL THERAPY TREATMENT   Patient Name: Mitchell Palmer MRN: [id number] DOB:[birth year], 57 y.o., male Today's Date: 07/22/2023  END OF SESSION:  PT End of Session - 07/22/23 1421     Visit Number 8    Number of Visits 17    Date for PT Re-Evaluation 09/04/23    Authorization Type [employer]    PT Start Time 1418    PT Stop Time 1505    PT Time Calculation (min) 47 min    Activity Tolerance Patient tolerated treatment well;Patient limited by pain    Behavior During Therapy WFL for tasks assessed/performed                  Past Medical History:  Diagnosis Date   Allergy    allegic rhinitis   Diabetes mellitus    type II   Esophageal stricture    GERD (gastroesophageal reflux disease)    History of hernia repair    as a baby   Hyperlipidemia    Hyperplastic polyp of intestine 2010   Hypertension    OSA (obstructive sleep apnea) 04/18/2022   Proliferative diabetic retinopathy of left eye (HCC) 03/31/2020   This is active as a patient still has tufts of the anterior segment neovascularization, rubeosis in the left eye every region of capillary nonperfusion should be treated with PRP.   S/P ear surgery, follow-up exam    for drainage   Vitreous hemorrhage of right eye (HCC) 12/24/2019   Past Surgical History:  Procedure Laterality Date   BIOPSY  12/07/2021   Procedure: BIOPSY;  Surgeon: Meryl Dare, MD;  Location: [facility];  Service: Gastroenterology;;   CATARACT EXTRACTION     COLONOSCOPY  03/07/2016   COLONOSCOPY WITH PROPOFOL N/A 12/07/2021   Procedure: COLONOSCOPY WITH PROPOFOL;  Surgeon: Meryl Dare, MD;  Location: [facility];  Service: Gastroenterology;  Laterality: N/A;   ESOPHAGOGASTRODUODENOSCOPY  04/20/2009   stricture/GERD   ESOPHAGOGASTRODUODENOSCOPY (EGD) WITH PROPOFOL N/A 12/07/2021   Procedure: ESOPHAGOGASTRODUODENOSCOPY (EGD) WITH PROPOFOL;  Surgeon: Meryl Dare, MD;  Location: [facility];  Service: Gastroenterology;   Laterality: N/A;   EYE SURGERY     HERNIA REPAIR     age 81-midline   INNER EAR SURGERY  08/20/1986   went in behind rt ear-blockage   KNEE ARTHROSCOPY Right 2023   NECK SURGERY     [facility]   POLYPECTOMY     SAVORY DILATION N/A 12/07/2021   Procedure: SAVORY DILATION;  Surgeon: Meryl Dare, MD;  Location: [facility];  Service: Gastroenterology;  Laterality: N/A;   SHOULDER ARTHROSCOPY WITH ROTATOR CUFF REPAIR AND SUBACROMIAL DECOMPRESSION Right 09/29/2012   Procedure: SHOULDER ARTHROSCOPY WITH ROTATOR CUFF REPAIR AND SUBACROMIAL DECOMPRESSION;  Surgeon: Mable Paris, MD;  Location: [facility];  Service: Orthopedics;  Laterality: Right;  Right shoulder arthroscopy with subacromial decompression and distal clavicle excision & Debridement of Labrial Tear   SHOULDER SURGERY Left    x2   UPPER GASTROINTESTINAL ENDOSCOPY     Patient Active Problem List   Diagnosis Date Noted   Peripheral neuropathy 02/08/2023   Heel pain, bilateral 02/08/2023   Dyspnea on exertion 07/30/2022   OSA (obstructive sleep apnea) 04/18/2022   Loud snoring 12/12/2021   Family history of colon cancer    Lump on finger 11/06/2021   Pseudophakia, left eye 10/26/2021   Pseudophakia, right eye 10/26/2021   Vitreomacular adhesion of left eye 09/21/2021   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/2021  Vitreous hemorrhage, left eye (HCC) 06/07/2021   Proliferative diabetic retinopathy of left eye with macular edema associated with type 2 diabetes mellitus (HCC) 06/07/2021   Localized swelling, mass and lump, upper limb 05/31/2021   Low vitamin D level 09/14/2020   Prostate cancer screening 09/02/2020   Current use of proton pump inhibitor 09/02/2020   Family history of factor V Leiden mutation 05/30/2020   Left ankle pain 01/29/2020   Obesity (BMI 30-39.9) 12/24/2019   Proliferative diabetic retinopathy of left eye determined by examination (HCC) 12/24/2019   Nuclear sclerotic  cataract of left eye 12/24/2019   Low testosterone 11/13/2019   Fatigue 10/20/2019   Neck pain 04/28/2018   Need for hepatitis B screening test 03/26/2018   Need for hepatitis C screening test 03/26/2018   Paresthesia of both feet 11/01/2017   Hemorrhoids 10/14/2017   Class 2 obesity due to excess calories with body mass index (BMI) of 35.0 to 35.9 in adult 01/24/2016   Hypothyroidism 01/24/2016   Type 2 diabetes mellitus with ophthalmic complication (HCC) 07/28/2015   Alcohol consumption heavy 12/06/2014   Routine general medical examination at a health care facility 09/30/2013   Low back pain 06/17/2012   PLANTAR FASCIITIS, TRAUMATIC 11/09/2009   NEOPLASM UNCERTAIN BEHAVIOR OTHER SPEC SITES 10/07/2009   ESOPHAGEAL STRICTURE 06/27/2009   GERD 03/31/2009   Hyperlipidemia associated with type 2 diabetes mellitus (HCC) 01/06/2009   Essential hypertension 01/06/2009   Allergic rhinitis 01/06/2009    PCP: Mitchell Pimple, MD  REFERRING PROVIDER: Louann Sjogren, DPM           Knee: Jodi Geralds  MD  REFERRING DIAG: Plantar fasciitis, right [M72.2]         S/p R knee Arthroscopy      THERAPY DIAG:  Pain in right ankle and joints of right foot  Muscle weakness (generalized)  Acute pain of right shoulder  Other abnormalities of gait and mobility  Rationale for Evaluation and Treatment: Rehabilitation  ONSET DATE: 6 months for R plantar fasciitis         06/26/2023 R Knee arthroscopy         SUBJECTIVE:   SUBJECTIVE STATEMENT: "Knee is doing okay, sore when I first get up from a chair, or getting out of bed. I did see the podiatris today, she didn't give him a shot, he still rolls the tennis ball. "  PERTINENT HISTORY: See PMHx  PAIN: R knee  Are you having pain? Yes: NPRS scale: 5/10 Pain location: Lateral aspect of the knee Pain description: sharp/ aching Aggravating factors: getting out of bed in the AM, getting out of chair initially Relieving factors:   N/A   R PF Are you having pain? Yes: NPRS scale: 0/10 Pain location: R heel  Pain description: sharpness Aggravating factors: getting out of bed in the morning Relieving factors: ankle brace, stretch    PRECAUTIONS: None  RED FLAGS: None   WEIGHT BEARING RESTRICTIONS: No  FALLS:  Has patient fallen in last 6 months? No  LIVING ENVIRONMENT: Lives with: lives with their family Lives in: House/apartment Stairs: Yes: External: 3 steps; none Has following equipment at home:  Brace  OCCUPATION: city [employer] [place],  PLOF: Independent with basic ADLs  PATIENT GOALS: stretching, relieving pain.    OBJECTIVE:  Note: Objective measures were completed at Evaluation unless otherwise noted.  DIAGNOSTIC FINDINGS:at MD's office.   PATIENT SURVEYS:  FOTO 65%  predicted 70%  11/20/224 knee 54% predicted 70%  COGNITION: Overall cognitive  status: Within functional limits for tasks assessed     SENSATION: WFL  POSTURE: rounded shoulders and forward head  PALPATION: TTP along the  bil with R>L calf/ gastroc and posterior tibialis and at the R medial calcaneal tubercle.   R Knee 07/10/23 Pocket of swelling superolaterlal aspect of the lateral joint of the femur. And TTP along the anterior aspect of the knee.    R knee Edema joint line:39.5 mm  L knee edema joint line: 37 mm  LOWER EXTREMITY ROM:     Active  Right Re-eval Left Re-eval Right 07/15/2023 Right 07/22/2023  Hip flexion      Hip extension      Hip abduction      Hip adduction      Hip internal rotation      Hip external rotation      Knee flexion 112*  118 119  Knee extension 8 *  6 8  Ankle dorsiflexion      Ankle plantarflexion      Ankle inversion      Ankle eversion       (Blank rows = not tested)    Notes: Pain and Stiffness at end range flexion,    LOWER EXTREMITY MMT:  MMT Right eval Left eval 07/10/2023 Re-eval 07/10/2023 Re-eval  Hip flexion   4+ 4+  Hip extension   4+ 4+  Hip  abduction   4+ 4+  Hip adduction      Hip internal rotation      Hip external rotation      Knee flexion   4-* 4+  Knee extension   4-* 4+  Ankle dorsiflexion 4+ P! 5    Ankle plantarflexion 5 5    Ankle inversion 4+ P! 5    Ankle eversion 3+  5     (Blank rows = not tested)       Notes: Pain with testing with weakness with R knee extension/   GAIT: Distance walked: 120 from waiting room to tx area Assistive device utilized: None Level of assistance: Complete Independence Comments: antalgic gait pattern with limited heel strike on the R compared bil   TREATMENT:                                                                                                                               [facility] Adult PT Treatment:                                                DATE: 07/22/2023 Therapeutic Exercise: Recumbent bike L2 x 6 min - lowered seat at 2:30 to promote knee flexion Standing calf/ hamstring stretch via slant board 2 x 30 sec Standing TKE with ball against the wall 2 x 10 with weight shift to the R  Step up 2  x 10 6 inch RLE  Updated HEP for seated hamstring stretch and sit to stand exercise.  Manual Therapy: Patellar mobs superior/ inferior grade III MTRP along the lateral proximal gastroc, vastus lateralis   [facility] Adult PT Treatment:                                                DATE: 07/15/2023 Therapeutic Exercise: Recumbent bike L 1 x 5 min  Quad set 1 x 10 holding 5 seconds (verbal cues to keep up towards the ceiling) RL SLR 2 x 10 Bridge 2 x 10 with knees bent equally.  LAQ with ballsqueeze 2 x 10 for VMO activation Sit to stand 2 x 10  Seated hamstring curling 2 x 10 with GTB Sit to stand 1 x 10 Manual Therapy: MTPR the vastus lateralis x 3 Patellar mobs and how to perform at home Modalities: Ice pack in supine with elevation x 10 min   [facility] Adult PT Treatment:                                                DATE: 07/10/2023 Therapeutic Exercise: Heel slides  1 x 5 Thomas test quad / hip flexor stretch 1 x 30 sec  Updated HEP for knee   PATIENT EDUCATION: 07/10/2023 Education details: Evaluation findings, POC, goals, HEP with proper form/ rationale. Person educated: Patient Education method: Explanation, Demonstration, Verbal cues, and Handouts Education comprehension: verbalized understanding  HOME EXERCISE PROGRAM: Access Code: [email or web address] URL: [email or web address] Date: 06/17/2023 Prepared by: [name]  Plantar Fascia Exercises  - Gastroc Stretch with Foot at Wall  - 3 x daily - 7 x weekly - 1-2 sets - 1-2 reps - 30-60 seconds hold - Soleus Stretch with Foot at Wall  - 3 x daily - 7 x weekly - 1 -2 sets - 1 - 2 reps - 30- 60 seconds hold - Seated Ankle Eversion with Resistance  - 1 x daily - 7 x weekly - 2 sets - 10 reps - Seated Calf Stretch with Strap  - 1 x daily - 7 x weekly - 1 - 2 sets - 2 reps - 30 - 60 seconds hold - Seated Ankle Dorsiflexion with Resistance  - 1 x daily - 7 x weekly - 2 sets - 10 reps - Seated Ankle Inversion with Resistance  - 1 x daily - 7 x weekly - 2 sets - 10 reps - Seated Heel Raise  - 1 x daily - 7 x weekly - 2 sets - 15 reps - Gastroc Stretch on Wall  - 1 x daily - 7 x weekly - 1 sets - 3 reps - 20 hold - Standing Soleus Stretch  - 1 x daily - 7 x weekly - 1 sets - 3 reps - 20 hold - Seated Hip External Rotation with Resistance  - 1 x daily - 7 x weekly - 2 sets - 12 reps  Knee:  Access Code: [email or web address] URL: [email or web address] Date: 07/22/2023 Prepared by: [name]  Exercises - Supine Heel Slides  - 1 x daily - 7 x weekly - 2 sets - 10 reps - Supine Quad Set  - 1 x daily - 7 x weekly - 2 sets -  10 reps - SLR (Mirrored)  - 1 x daily - 7 x weekly - 2 sets - 10 reps - 1 hold - Supine Bridge  - 1 x daily - 7 x weekly - 2 sets - 10 reps - Modified Thomas Stretch  - 1 x daily - 7 x weekly - 2 sets - 2 reps - 30 seconds hold - Sit to Stand  - 1 x daily -  7 x weekly - 2 sets - 10 reps - Seated Hamstring Stretch  - 2 x daily - 7 x weekly - 2 sets - 2 reps - 30 hold ASSESSMENT:  CLINICAL IMPRESSION: 07/22/2023 Jmarcus arrives to PT reporting the knee is overall doing ok, continues to have soreness that occurs mostly when he gets up after he has been inactive for a little bit. He does continue to have some edema located along the anterolateral aspect of the knee which does appear to impact his ROM which today measured 8-119 degrees. Continued working on ROM and gross hip/ knee strengthening which he did well with. Updated HEP today to continued progression of strengthening.   Re-evaluation adding R Knee  Pt arrives to PT s/p R knee scope on 11/6. Since surgery he reports overall doing well, but does have pain and swelling which most noticeably is occurring along the anterolateral aspect of the R knee. He demonstrates limited knee ROM and weakness as a result of pain and stiffness. He is TTPD and the anterior aspect of the knee. He is going to the MD tomorrow to get fluid pulled of his knee, per the pt report. He would benefit from physical therapy to reduce  R knee pain, improve ROM and strength, and maximize his function by addressing the deficits listed.    R PF  EVALUATION: Patient is a 57 y.o. M who was seen today for physical therapy evaluation and treatment for dx of R Plantar fasciitis. Pt presents with R heel pain that started insidisously 6 months ago.  He has functional ankle ROM compared bil with weakness in R ankle DF/ inversion/ eversion with pain during assessment. TTP along bil gastroc/ soleus and posterior tib with R worse than left and specific point tenderness along the medial calcaneal tubercle. Patient responded well in session with MTPR along the R calf and posterior tib, followed with stretching calf strengthening. He noted relief in the calf and in the heel end of the session. Pt would benefit from physical therapy to decrease R heel  pain, reduce calf tightness, improve gross LE strength, and maximize gait efficiency and overall function by addressing the deficits listed.   OBJECTIVE IMPAIRMENTS: Abnormal gait, decreased activity tolerance, decreased endurance, decreased strength, increased fascial restrictions, increased muscle spasms, improper body mechanics, postural dysfunction, and pain.   ACTIVITY LIMITATIONS: lifting, standing, squatting, and locomotion level  PARTICIPATION LIMITATIONS: community activity, occupation, and yard work  PERSONAL FACTORS: Age, Past/current experiences, and 1-2 comorbidities: hx of L PF tear, R knee surgery  are also affecting patient's functional outcome.   REHAB POTENTIAL: Good  CLINICAL DECISION MAKING: Evolving/moderate complexity  EVALUATION COMPLEXITY: Moderate   GOALS: Goals reviewed with patient? Yes  SHORT TERM GOALS: Target date: 07/01/2023   PT to be IND with initial HEP for therapeutic progression Baseline: Goal status: MET 07/10/2023  2.  Pt to report pain to </= 7/10 max with getting out of bed in the AM for improvement in condition Baseline:  Goal status: Met 07/10/2023  3.  Pt to verbalize/ demo  efficient gait pattern utilizing heel strike/ toe off with </= 5/10 max pain noted during gait. Baseline:  Goal status: MET 07/10/2023   LONG TERM GOALS: Target date: updated to 09/04/2023  Increase R calf gross strength to >/= 4+/5 to promote ankle strength/ stability with walking/ standing Baseline:  Goal status: ongoing 07/10/2023  2.  Pt to report max pain in the morning to </= 3/10 and during the day to </=1/10 pain for improvement in condition.  Baseline:  Goal status: ongoing 07/10/2023  3.  Improve FOTO score to >/= 70% to demo improvement in function Baseline:  Goal status: updated 11/20/204  4.  Pt to be able to sit/ stand and walk  and perform work related activities without limitation. Baseline:  Goal status: ongoing 07/10/2023  5.  Pt to  be IND with all HEP and will is able to maintain and progress their current level of function IND. Baseline:  Goal status: ongoing 07/10/2023    6.  Increase knee total arc ROM to >/= 4 - 118 degrees for function ROM required for mobility for ADLS Baseline:  Goal status: INITIAL    7.  Increase R knee strength to >/= 4+/5 to promote stability with lifting and ADLS.  Baseline:  Goal status: INITIAL  8.  Pt be able to sit, stand and walk for >/= 1 hour with </= 2/10 max pain for functional endurance required for ADLS and work related activities  Baseline:  Goal status: INITIAL  PLAN:  PT FREQUENCY: 1-2x/week  PT DURATION: 8 weeks  PLANNED INTERVENTIONS: 97110-Therapeutic exercises, 97530- Therapeutic activity, [id number]- Neuromuscular re-education, 97535- Self Care, [id number]- Manual therapy, [id number]- Gait training, [id number]- Ultrasound, [id number]- Ionotophoresis 4mg /ml Dexamethasone, Taping, Dry Needling, Joint mobilization, Joint manipulation, Cryotherapy, and Moist heat  PLAN FOR NEXT SESSION: Review/ update HEP PRN. STW along the R gastroc/ soleus, consider DN if pt is up for it. Foot intrinsic strengthening, ankle extrinsic strengtheneing, calf stretching.  Knee ROM, STrengthening,  Schae Cando PT, DPT, LAT, ATC  07/22/23  3:16 PM

## 2023-07-24 ENCOUNTER — Other Ambulatory Visit: Payer: Self-pay | Admitting: Internal Medicine

## 2023-07-25 ENCOUNTER — Ambulatory Visit: Payer: 59 | Admitting: Physical Therapy

## 2023-07-25 ENCOUNTER — Encounter: Payer: Self-pay | Admitting: Physical Therapy

## 2023-07-25 DIAGNOSIS — M25571 Pain in right ankle and joints of right foot: Secondary | ICD-10-CM

## 2023-07-25 DIAGNOSIS — M25511 Pain in right shoulder: Secondary | ICD-10-CM

## 2023-07-25 DIAGNOSIS — M6281 Muscle weakness (generalized): Secondary | ICD-10-CM

## 2023-07-25 DIAGNOSIS — R2689 Other abnormalities of gait and mobility: Secondary | ICD-10-CM

## 2023-07-25 NOTE — Therapy (Signed)
OUTPATIENT PHYSICAL THERAPY TREATMENT   Patient Name: Mitchell Palmer MRN: [id number] DOB:1966-05-02, 57 y.o., male Today's Date: 07/25/2023  END OF SESSION:  PT End of Session - 07/25/23 1510     Visit Number 9    Number of Visits 17    Date for PT Re-Evaluation 09/04/23    Authorization Type [employer]    PT Start Time 1417    PT Stop Time 1510    PT Time Calculation (min) 53 min    Activity Tolerance Patient tolerated treatment well    Behavior During Therapy WFL for tasks assessed/performed                   Past Medical History:  Diagnosis Date   Allergy    allegic rhinitis   Diabetes mellitus    type II   Esophageal stricture    GERD (gastroesophageal reflux disease)    History of hernia repair    as a baby   Hyperlipidemia    Hyperplastic polyp of intestine 2010   Hypertension    OSA (obstructive sleep apnea) 04/18/2022   Proliferative diabetic retinopathy of left eye (HCC) 03/31/2020   This is active as a patient still has tufts of the anterior segment neovascularization, rubeosis in the left eye every region of capillary nonperfusion should be treated with PRP.   S/P ear surgery, follow-up exam    for drainage   Vitreous hemorrhage of right eye (HCC) 12/24/2019   Past Surgical History:  Procedure Laterality Date   BIOPSY  12/07/2021   Procedure: BIOPSY;  Surgeon: Meryl Dare, MD;  Location: [facility];  Service: Gastroenterology;;   CATARACT EXTRACTION     COLONOSCOPY  03/07/2016   COLONOSCOPY WITH PROPOFOL N/A 12/07/2021   Procedure: COLONOSCOPY WITH PROPOFOL;  Surgeon: Meryl Dare, MD;  Location: [facility];  Service: Gastroenterology;  Laterality: N/A;   ESOPHAGOGASTRODUODENOSCOPY  04/20/2009   stricture/GERD   ESOPHAGOGASTRODUODENOSCOPY (EGD) WITH PROPOFOL N/A 12/07/2021   Procedure: ESOPHAGOGASTRODUODENOSCOPY (EGD) WITH PROPOFOL;  Surgeon: Meryl Dare, MD;  Location: [facility];  Service: Gastroenterology;  Laterality: N/A;   EYE  SURGERY     HERNIA REPAIR     age 87-midline   INNER EAR SURGERY  08/20/1986   went in behind rt ear-blockage   KNEE ARTHROSCOPY Right 2023   NECK SURGERY     [facility]   POLYPECTOMY     SAVORY DILATION N/A 12/07/2021   Procedure: SAVORY DILATION;  Surgeon: Meryl Dare, MD;  Location: [facility];  Service: Gastroenterology;  Laterality: N/A;   SHOULDER ARTHROSCOPY WITH ROTATOR CUFF REPAIR AND SUBACROMIAL DECOMPRESSION Right 09/29/2012   Procedure: SHOULDER ARTHROSCOPY WITH ROTATOR CUFF REPAIR AND SUBACROMIAL DECOMPRESSION;  Surgeon: Mable Paris, MD;  Location: [facility];  Service: Orthopedics;  Laterality: Right;  Right shoulder arthroscopy with subacromial decompression and distal clavicle excision & Debridement of Labrial Tear   SHOULDER SURGERY Left    x2   UPPER GASTROINTESTINAL ENDOSCOPY     Patient Active Problem List   Diagnosis Date Noted   Peripheral neuropathy 02/08/2023   Heel pain, bilateral 02/08/2023   Dyspnea on exertion 07/30/2022   OSA (obstructive sleep apnea) 04/18/2022   Loud snoring 12/12/2021   Family history of colon cancer    Lump on finger 11/06/2021   Pseudophakia, left eye 10/26/2021   Pseudophakia, right eye 10/26/2021   Vitreomacular adhesion of left eye 09/21/2021   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/2021   Vitreous hemorrhage,  left eye (HCC) 06/07/2021   Proliferative diabetic retinopathy of left eye with macular edema associated with type 2 diabetes mellitus (HCC) 06/07/2021   Localized swelling, mass and lump, upper limb 05/31/2021   Low vitamin D level 09/14/2020   Prostate cancer screening 09/02/2020   Current use of proton pump inhibitor 09/02/2020   Family history of factor V Leiden mutation 05/30/2020   Left ankle pain 01/29/2020   Obesity (BMI 30-39.9) 12/24/2019   Proliferative diabetic retinopathy of left eye determined by examination (HCC) 12/24/2019   Nuclear sclerotic cataract of left eye  12/24/2019   Low testosterone 11/13/2019   Fatigue 10/20/2019   Neck pain 04/28/2018   Need for hepatitis B screening test 03/26/2018   Need for hepatitis C screening test 03/26/2018   Paresthesia of both feet 11/01/2017   Hemorrhoids 10/14/2017   Class 2 obesity due to excess calories with body mass index (BMI) of 35.0 to 35.9 in adult 01/24/2016   Hypothyroidism 01/24/2016   Type 2 diabetes mellitus with ophthalmic complication (HCC) 07/28/2015   Alcohol consumption heavy 12/06/2014   Routine general medical examination at a health care facility 09/30/2013   Low back pain 06/17/2012   PLANTAR FASCIITIS, TRAUMATIC 11/09/2009   NEOPLASM UNCERTAIN BEHAVIOR OTHER SPEC SITES 10/07/2009   ESOPHAGEAL STRICTURE 06/27/2009   GERD 03/31/2009   Hyperlipidemia associated with type 2 diabetes mellitus (HCC) 01/06/2009   Essential hypertension 01/06/2009   Allergic rhinitis 01/06/2009    PCP: Judy Pimple, MD  REFERRING PROVIDER: Louann Sjogren, DPM           Knee: Jodi Geralds  MD  REFERRING DIAG: Plantar fasciitis, right [M72.2]         S/p R knee Arthroscopy      THERAPY DIAG:  Pain in right ankle and joints of right foot  Acute pain of right shoulder  Muscle weakness (generalized)  Other abnormalities of gait and mobility  Rationale for Evaluation and Treatment: Rehabilitation  ONSET DATE: 6 months for R plantar fasciitis         06/26/2023 R Knee arthroscopy         SUBJECTIVE:   SUBJECTIVE STATEMENT: "Knee is doing pretty good, they gave me a cortisone injection on Tuesday. Steps seemed to be more of the challenge, and getting up first thing."  PERTINENT HISTORY: See PMHx  PAIN: R knee  Are you having pain? Yes: NPRS scale: 3/10 Pain location: Lateral aspect of the knee Pain description: sharp/ aching Aggravating factors: getting out of bed in the AM, getting out of chair initially Relieving factors:  N/A   R PF Are you having pain? Yes: NPRS scale:  0/10 Pain location: R heel  Pain description: sharpness Aggravating factors: getting out of bed in the morning Relieving factors: ankle brace, stretch    PRECAUTIONS: None  RED FLAGS: None   WEIGHT BEARING RESTRICTIONS: No  FALLS:  Has patient fallen in last 6 months? No  LIVING ENVIRONMENT: Lives with: lives with their family Lives in: House/apartment Stairs: Yes: External: 3 steps; none Has following equipment at home:  Brace  OCCUPATION: city of [place],  PLOF: Independent with basic ADLs  PATIENT GOALS: stretching, relieving pain.    OBJECTIVE:  Note: Objective measures were completed at Evaluation unless otherwise noted.  DIAGNOSTIC FINDINGS:at MD's office.   PATIENT SURVEYS:  FOTO 65%  predicted 70%  11/20/224 knee 54% predicted 70%  COGNITION: Overall cognitive status: Within functional limits for tasks assessed     SENSATION: [facility]  POSTURE: rounded shoulders and forward head  PALPATION: TTP along the  bil with R>L calf/ gastroc and posterior tibialis and at the R medial calcaneal tubercle.   R Knee 07/10/23 Pocket of swelling superolaterlal aspect of the lateral joint of the femur. And TTP along the anterior aspect of the knee.    R knee Edema joint line:39.5 mm  L knee edema joint line: 37 mm  LOWER EXTREMITY ROM:     Active  Right Re-eval Left Re-eval Right 07/15/2023 Right 07/22/2023  Hip flexion      Hip extension      Hip abduction      Hip adduction      Hip internal rotation      Hip external rotation      Knee flexion 112*  118 119  Knee extension 8 *  6 8  Ankle dorsiflexion      Ankle plantarflexion      Ankle inversion      Ankle eversion       (Blank rows = not tested)    Notes: Pain and Stiffness at end range flexion,    LOWER EXTREMITY MMT:  MMT Right eval Left eval 07/10/2023 Re-eval 07/10/2023 Re-eval  Hip flexion   4+ 4+  Hip extension   4+ 4+  Hip abduction   4+ 4+  Hip adduction      Hip internal  rotation      Hip external rotation      Knee flexion   4-* 4+  Knee extension   4-* 4+  Ankle dorsiflexion 4+ P! 5    Ankle plantarflexion 5 5    Ankle inversion 4+ P! 5    Ankle eversion 3+  5     (Blank rows = not tested)       Notes: Pain with testing with weakness with R knee extension/   GAIT: Distance walked: 120 from waiting room to tx area Assistive device utilized: None Level of assistance: Complete Independence Comments: antalgic gait pattern with limited heel strike on the R compared bil   TREATMENT:                                                                                                                               [facility] Adult PT Treatment:                                                DATE: 07/25/2023 Therapeutic Exercise: Step down RLE only 3 x 10 Leg press 2 x 12 40#, 1 x 12 60# Knee extension machine 1 x 12 10#, 2 x 10 con bil/ Ecc RLE only 10# Hamstring curl 2 x 15 20# bil  Therapeutic Activity: Stair training on 6 in steps x 10  up / down with verbal cues/ demonstration for  proper form Modalities: Ice pack x 10 min in supine   [facility] Adult PT Treatment:                                                DATE: 07/22/2023 Therapeutic Exercise: Recumbent bike L2 x 6 min - lowered seat at 2:30 to promote knee flexion Standing calf/ hamstring stretch via slant board 2 x 30 sec Standing TKE with ball against the wall 2 x 10 with weight shift to the R  Step up 2 x 10 6 inch RLE  Updated HEP for seated hamstring stretch and sit to stand exercise.  Manual Therapy: Patellar mobs superior/ inferior grade III MTRP along the lateral proximal gastroc, vastus lateralis   [facility] Adult PT Treatment:                                                DATE: 07/15/2023 Therapeutic Exercise: Recumbent bike L 1 x 5 min  Quad set 1 x 10 holding 5 seconds (verbal cues to keep up towards the ceiling) RL SLR 2 x 10 Bridge 2 x 10 with knees bent equally.  LAQ with ballsqueeze 2  x 10 for VMO activation Sit to stand 2 x 10  Seated hamstring curling 2 x 10 with GTB Sit to stand 1 x 10 Manual Therapy: MTPR the vastus lateralis x 3 Patellar mobs and how to perform at home Modalities: Ice pack in supine with elevation x 10 min   PATIENT EDUCATION: 07/10/2023 Education details: Evaluation findings, POC, goals, HEP with proper form/ rationale. Person educated: Patient Education method: Explanation, Demonstration, Verbal cues, and Handouts Education comprehension: verbalized understanding  HOME EXERCISE PROGRAM: Access Code: [email or web address] URL: [email or web address] Date: 06/17/2023 Prepared by: [name]  Plantar Fascia Exercises  - Gastroc Stretch with Foot at Wall  - 3 x daily - 7 x weekly - 1-2 sets - 1-2 reps - 30-60 seconds hold - Soleus Stretch with Foot at Wall  - 3 x daily - 7 x weekly - 1 -2 sets - 1 - 2 reps - 30- 60 seconds hold - Seated Ankle Eversion with Resistance  - 1 x daily - 7 x weekly - 2 sets - 10 reps - Seated Calf Stretch with Strap  - 1 x daily - 7 x weekly - 1 - 2 sets - 2 reps - 30 - 60 seconds hold - Seated Ankle Dorsiflexion with Resistance  - 1 x daily - 7 x weekly - 2 sets - 10 reps - Seated Ankle Inversion with Resistance  - 1 x daily - 7 x weekly - 2 sets - 10 reps - Seated Heel Raise  - 1 x daily - 7 x weekly - 2 sets - 15 reps - Gastroc Stretch on Wall  - 1 x daily - 7 x weekly - 1 sets - 3 reps - 20 hold - Standing Soleus Stretch  - 1 x daily - 7 x weekly - 1 sets - 3 reps - 20 hold - Seated Hip External Rotation with Resistance  - 1 x daily - 7 x weekly - 2 sets - 12 reps  Knee:  Access Code: [email or web address] URL: [email or web address] Date: 07/22/2023 Prepared by: [name]  Exercises -  Supine Heel Slides  - 1 x daily - 7 x weekly - 2 sets - 10 reps - Supine Quad Set  - 1 x daily - 7 x weekly - 2 sets - 10 reps - SLR (Mirrored)  - 1 x daily - 7 x weekly - 2 sets - 10 reps - 1 hold -  Supine Bridge  - 1 x daily - 7 x weekly - 2 sets - 10 reps - Modified Thomas Stretch  - 1 x daily - 7 x weekly - 2 sets - 2 reps - 30 seconds hold - Sit to Stand  - 1 x daily - 7 x weekly - 2 sets - 10 reps - Seated Hamstring Stretch  - 2 x daily - 7 x weekly - 2 sets - 2 reps - 30 hold ASSESSMENT:  CLINICAL IMPRESSION: 07/25/2023 Mr Benjamin arrives to PT today reporting overall doing better. 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 Continued strengthening with focus of eccentric control to facilitate strength and control with descending steps. Pt requested ice end of session, but otherwise reported doing well end of session.   Re-evaluation adding R Knee  Pt arrives to PT s/p R knee scope on 11/6. Since surgery he reports overall doing well, but does have pain and swelling which most noticeably is occurring along the anterolateral aspect of the R knee. He demonstrates limited knee ROM and weakness as a result of pain and stiffness. He is TTPD and the anterior aspect of the knee. He is going to the MD tomorrow to get fluid pulled of his knee, per the pt report. He would benefit from physical therapy to reduce  R knee pain, improve ROM and strength, and maximize his function by addressing the deficits listed.    R PF  EVALUATION: Patient is a 57 y.o. M who was seen today for physical therapy evaluation and treatment for dx of R Plantar fasciitis. Pt presents with R heel pain that started insidisously 6 months ago.  He has functional ankle ROM compared bil with weakness in R ankle DF/ inversion/ eversion with pain during assessment. TTP along bil gastroc/ soleus and posterior tib with R worse than left and specific point tenderness along the medial calcaneal tubercle. Patient responded well in session with MTPR along the R calf and posterior tib, followed with stretching calf strengthening. He noted relief in the calf and in the heel  end of the session. Pt would benefit from physical therapy to decrease R heel pain, reduce calf tightness, improve gross LE strength, and maximize gait efficiency and overall function by addressing the deficits listed.   OBJECTIVE IMPAIRMENTS: Abnormal gait, decreased activity tolerance, decreased endurance, decreased strength, increased fascial restrictions, increased muscle spasms, improper body mechanics, postural dysfunction, and pain.   ACTIVITY LIMITATIONS: lifting, standing, squatting, and locomotion level  PARTICIPATION LIMITATIONS: community activity, occupation, and yard work  PERSONAL FACTORS: Age, Past/current experiences, and 1-2 comorbidities: hx of L PF tear, R knee surgery  are also affecting patient's functional outcome.   REHAB POTENTIAL: Good  CLINICAL DECISION MAKING: Evolving/moderate complexity  EVALUATION COMPLEXITY: Moderate   GOALS: Goals reviewed with patient? Yes  SHORT TERM GOALS: Target date: 07/01/2023   PT to be IND with initial HEP for therapeutic progression Baseline: Goal status: MET 07/10/2023  2.  Pt to report pain to </= 7/10 max with getting out of bed in the AM for improvement in condition Baseline:  Goal  status: Met 07/10/2023  3.  Pt to verbalize/ demo efficient gait pattern utilizing heel strike/ toe off with </= 5/10 max pain noted during gait. Baseline:  Goal status: MET 07/10/2023   LONG TERM GOALS: Target date: updated to 09/04/2023  Increase R calf gross strength to >/= 4+/5 to promote ankle strength/ stability with walking/ standing Baseline:  Goal status: ongoing 07/10/2023  2.  Pt to report max pain in the morning to </= 3/10 and during the day to </=1/10 pain for improvement in condition.  Baseline:  Goal status: ongoing 07/10/2023  3.  Improve FOTO score to >/= 70% to demo improvement in function Baseline:  Goal status: updated 11/20/204  4.  Pt to be able to sit/ stand and walk  and perform work related activities  without limitation. Baseline:  Goal status: ongoing 07/10/2023  5.  Pt to be IND with all HEP and will is able to maintain and progress their current level of function IND. Baseline:  Goal status: ongoing 07/10/2023    6.  Increase knee total arc ROM to >/= 4 - 118 degrees for function ROM required for mobility for ADLS Baseline:  Goal status: INITIAL    7.  Increase R knee strength to >/= 4+/5 to promote stability with lifting and ADLS.  Baseline:  Goal status: INITIAL  8.  Pt be able to sit, stand and walk for >/= 1 hour with </= 2/10 max pain for functional endurance required for ADLS and work related activities  Baseline:  Goal status: INITIAL  PLAN:  PT FREQUENCY: 1-2x/week  PT DURATION: 8 weeks  PLANNED INTERVENTIONS: 97110-Therapeutic exercises, 97530- Therapeutic activity, [id number]- Neuromuscular re-education, 97535- Self Care, [id number]- Manual therapy, [id number]- Gait training, [id number]- Ultrasound, [id number]- Ionotophoresis 4mg /ml Dexamethasone, Taping, Dry Needling, Joint mobilization, Joint manipulation, Cryotherapy, and Moist heat  PLAN FOR NEXT SESSION: Review/ update HEP PRN. STW along the R gastroc/ soleus, consider DN if pt is up for it. Foot intrinsic strengthening, ankle extrinsic strengtheneing, calf stretching.  Knee ROM, STrengthening,  Cheetara Hoge PT, DPT, LAT, ATC  07/25/23  3:11 PM

## 2023-07-29 LAB — HM DIABETES EYE EXAM

## 2023-07-30 ENCOUNTER — Ambulatory Visit: Payer: 59 | Admitting: Physical Therapy

## 2023-07-30 ENCOUNTER — Encounter: Payer: Self-pay | Admitting: Physical Therapy

## 2023-07-30 DIAGNOSIS — M25511 Pain in right shoulder: Secondary | ICD-10-CM

## 2023-07-30 DIAGNOSIS — M25571 Pain in right ankle and joints of right foot: Secondary | ICD-10-CM
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
## 2023-07-31 ENCOUNTER — Encounter: Payer: Self-pay | Admitting: Family Medicine

## 2023-08-01 MED ORDER — GABAPENTIN 100 MG PO CAPS: 200.0000 mg | ORAL_CAPSULE | Freq: Two times a day (BID) | ORAL | 0 refills | Status: DC

## 2023-08-02 ENCOUNTER — Ambulatory Visit: Payer: 59 | Admitting: Physical Therapy

## 2023-08-03 ENCOUNTER — Other Ambulatory Visit: Payer: Self-pay | Admitting: Internal Medicine

## 2023-08-06 ENCOUNTER — Encounter: Payer: Self-pay | Admitting: Physical Therapy

## 2023-08-06 ENCOUNTER — Ambulatory Visit: Payer: 59 | Admitting: Physical Therapy

## 2023-08-06 DIAGNOSIS — M6281 Muscle weakness (generalized): Secondary | ICD-10-CM

## 2023-08-06 DIAGNOSIS — R2689 Other abnormalities of gait and mobility: Secondary | ICD-10-CM

## 2023-08-06 DIAGNOSIS — M25511 Pain in right shoulder: Secondary | ICD-10-CM

## 2023-08-06 DIAGNOSIS — M25571 Pain in right ankle and joints of right foot: Secondary | ICD-10-CM

## 2023-08-06 NOTE — Therapy (Signed)
OUTPATIENT PHYSICAL THERAPY TREATMENT   Patient Name: Mitchell Palmer MRN: [id number] DOB:Jan 18, 1966, 57 y.o., male Today's Date: 08/06/2023  END OF SESSION:  PT End of Session - 08/06/23 1104     Visit Number 11    Number of Visits 17    Date for PT Re-Evaluation 09/04/23    Authorization Type [employer]    PT Start Time 1102    PT Stop Time 1145    PT Time Calculation (min) 43 min                   Past Medical History:  Diagnosis Date   Allergy    allegic rhinitis   Diabetes mellitus    type II   Esophageal stricture    GERD (gastroesophageal reflux disease)    History of hernia repair    as a baby   Hyperlipidemia    Hyperplastic polyp of intestine 2010   Hypertension    OSA (obstructive sleep apnea) 04/18/2022   Proliferative diabetic retinopathy of left eye (HCC) 03/31/2020   This is active as a patient still has tufts of the anterior segment neovascularization, rubeosis in the left eye every region of capillary nonperfusion should be treated with PRP.   S/P ear surgery, follow-up exam    for drainage   Vitreous hemorrhage of right eye (HCC) 12/24/2019   Past Surgical History:  Procedure Laterality Date   BIOPSY  12/07/2021   Procedure: BIOPSY;  Surgeon: Meryl Dare, MD;  Location: [facility];  Service: Gastroenterology;;   CATARACT EXTRACTION     COLONOSCOPY  03/07/2016   COLONOSCOPY WITH PROPOFOL N/A 12/07/2021   Procedure: COLONOSCOPY WITH PROPOFOL;  Surgeon: Meryl Dare, MD;  Location: [facility];  Service: Gastroenterology;  Laterality: N/A;   ESOPHAGOGASTRODUODENOSCOPY  04/20/2009   stricture/GERD   ESOPHAGOGASTRODUODENOSCOPY (EGD) WITH PROPOFOL N/A 12/07/2021   Procedure: ESOPHAGOGASTRODUODENOSCOPY (EGD) WITH PROPOFOL;  Surgeon: Meryl Dare, MD;  Location: [facility];  Service: Gastroenterology;  Laterality: N/A;   EYE SURGERY     HERNIA REPAIR     age 35-midline   INNER EAR SURGERY  08/20/1986   went in behind rt ear-blockage    KNEE ARTHROSCOPY Right 2023   NECK SURGERY     [facility]   POLYPECTOMY     SAVORY DILATION N/A 12/07/2021   Procedure: SAVORY DILATION;  Surgeon: Meryl Dare, MD;  Location: [facility];  Service: Gastroenterology;  Laterality: N/A;   SHOULDER ARTHROSCOPY WITH ROTATOR CUFF REPAIR AND SUBACROMIAL DECOMPRESSION Right 09/29/2012   Procedure: SHOULDER ARTHROSCOPY WITH ROTATOR CUFF REPAIR AND SUBACROMIAL DECOMPRESSION;  Surgeon: Mable Paris, MD;  Location: [facility];  Service: Orthopedics;  Laterality: Right;  Right shoulder arthroscopy with subacromial decompression and distal clavicle excision & Debridement of Labrial Tear   SHOULDER SURGERY Left    x2   UPPER GASTROINTESTINAL ENDOSCOPY     Patient Active Problem List   Diagnosis Date Noted   Peripheral neuropathy 02/08/2023   Heel pain, bilateral 02/08/2023   Dyspnea on exertion 07/30/2022   OSA (obstructive sleep apnea) 04/18/2022   Loud snoring 12/12/2021   Family history of colon cancer    Lump on finger 11/06/2021   Pseudophakia, left eye 10/26/2021   Pseudophakia, right eye 10/26/2021   Vitreomacular adhesion of left eye 09/21/2021   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/2021   Vitreous hemorrhage, left eye (HCC) 06/07/2021   Proliferative diabetic retinopathy of left eye with macular edema associated with type 2  diabetes mellitus (HCC) 06/07/2021   Localized swelling, mass and lump, upper limb 05/31/2021   Low vitamin D level 09/14/2020   Prostate cancer screening 09/02/2020   Current use of proton pump inhibitor 09/02/2020   Family history of factor V Leiden mutation 05/30/2020   Left ankle pain 01/29/2020   Obesity (BMI 30-39.9) 12/24/2019   Proliferative diabetic retinopathy of left eye determined by examination (HCC) 12/24/2019   Nuclear sclerotic cataract of left eye 12/24/2019   Low testosterone 11/13/2019   Fatigue 10/20/2019   Neck pain 04/28/2018   Need for hepatitis B screening  test 03/26/2018   Need for hepatitis C screening test 03/26/2018   Paresthesia of both feet 11/01/2017   Hemorrhoids 10/14/2017   Class 2 obesity due to excess calories with body mass index (BMI) of 35.0 to 35.9 in adult 01/24/2016   Hypothyroidism 01/24/2016   Type 2 diabetes mellitus with ophthalmic complication (HCC) 07/28/2015   Alcohol consumption heavy 12/06/2014   Routine general medical examination at a health care facility 09/30/2013   Low back pain 06/17/2012   PLANTAR FASCIITIS, TRAUMATIC 11/09/2009   NEOPLASM UNCERTAIN BEHAVIOR OTHER SPEC SITES 10/07/2009   ESOPHAGEAL STRICTURE 06/27/2009   GERD 03/31/2009   Hyperlipidemia associated with type 2 diabetes mellitus (HCC) 01/06/2009   Essential hypertension 01/06/2009   Allergic rhinitis 01/06/2009    PCP: Judy Pimple, MD  REFERRING PROVIDER: Louann Sjogren, DPM           Knee: Jodi Geralds  MD  REFERRING DIAG: Plantar fasciitis, right [M72.2]         S/p R knee Arthroscopy      THERAPY DIAG:  Pain in right ankle and joints of right foot  Acute pain of right shoulder  Muscle weakness (generalized)  Other abnormalities of gait and mobility  Rationale for Evaluation and Treatment: Rehabilitation  ONSET DATE: 6 months for R plantar fasciitis         06/26/2023 R Knee arthroscopy         SUBJECTIVE:   SUBJECTIVE STATEMENT: 2-3/10 right knee. Calf is tender. Shoulder is fine.      PERTINENT HISTORY: See PMHx  PAIN: R knee  Are you having pain? Yes: NPRS scale: 2-3/10 Pain location: Lateral aspect of the knee Pain description: sharp/ aching Aggravating factors: getting out of bed in the AM, getting out of chair initially Relieving factors:  N/A   R PF Are you having pain? Yes: NPRS scale: 0/10 Pain location: R calf  Pain description: sharpness Aggravating factors: getting out of bed in the morning Relieving factors: ankle brace, stretch    PRECAUTIONS: None  RED FLAGS: None   WEIGHT  BEARING RESTRICTIONS: No  FALLS:  Has patient fallen in last 6 months? No  LIVING ENVIRONMENT: Lives with: lives with their family Lives in: House/apartment Stairs: Yes: External: 3 steps; none Has following equipment at home:  Brace  OCCUPATION: city of [place],  PLOF: Independent with basic ADLs  PATIENT GOALS: stretching, relieving pain.    OBJECTIVE:  Note: Objective measures were completed at Evaluation unless otherwise noted.  DIAGNOSTIC FINDINGS:at MD's office.   PATIENT SURVEYS:  FOTO 65%  predicted 70% 08/06/23: 67% (11 th visit)   11/20/224 knee 54% predicted 70% 08/06/23:  61% (6th visit)   COGNITION: Overall cognitive status: Within functional limits for tasks assessed     SENSATION: WFL  POSTURE: rounded shoulders and forward head  PALPATION: TTP along the  bil with R>L calf/ gastroc and posterior  tibialis and at the R medial calcaneal tubercle.   R Knee 07/10/23 Pocket of swelling superolaterlal aspect of the lateral joint of the femur. And TTP along the anterior aspect of the knee.    R knee Edema joint line:39.5 mm  L knee edema joint line: 37 mm  LOWER EXTREMITY ROM:     Active  Right Re-eval Left Re-eval Right 07/15/2023 Right 07/22/2023 Right  07/30/23  Hip flexion       Hip extension       Hip abduction       Hip adduction       Hip internal rotation       Hip external rotation       Knee flexion 112*  118 119   Knee extension 8 *  6 8 6  pulling posterior knee   Ankle dorsiflexion       Ankle plantarflexion       Ankle inversion       Ankle eversion        (Blank rows = not tested)    Notes: Pain and Stiffness at end range flexion,    LOWER EXTREMITY MMT:  MMT Right eval Left eval 07/10/2023 Re-eval 07/10/2023 Re-eval 08/06/23:  Right  Hip flexion   4+ 4+   Hip extension   4+ 4+   Hip abduction   4+ 4+   Hip adduction       Hip internal rotation       Hip external rotation       Knee flexion   4-* 4+   Knee  extension   4-* 4+   Ankle dorsiflexion 4+ P! 5   5  Ankle plantarflexion 5 5     Ankle inversion 4+ P! 5   5 p  Ankle eversion 3+  5   4+ P   (Blank rows = not tested)       Notes: Pain with testing with weakness with R knee extension/   GAIT: Distance walked: 120 from waiting room to tx area Assistive device utilized: None Level of assistance: Complete Independence Comments: antalgic gait pattern with limited heel strike on the R compared bil   TREATMENT:                                                                                                                               [facility] Adult PT Treatment:                                                DATE: 08/06/23 Therapeutic Exercise: Standing TKE blue band  Ankle inv, ev, DF with green band x 15 each Heel raise with Right eccentric lower Gastroc  stretch Soleus stretch Knee ext 10# bilat with eccentric Rt Knee flex 25# with bilat and eccentric right STS 10#  10 x 2  6 inch step up x 12 6 inch lateral step down x 12 Single leg bridge x 15  SAQ with Ball Squeeze 15 x 2  SLR with ER 2# x 15 x 10  Therapeutic Activity: FOTO updated for Knee and Foot     [facility] Adult PT Treatment:                                                DATE: 07/30/23 Therapeutic Exercise: Supine Qs- puling posterior knee and calf Prone knee hang x 2 minutes  Runners stretch right Standing TKE green band in doorway 10 RLE forward, x 10 back Knee extension machine 1 x 12 10# con bil/ Ecc RLE only 10#, 10 x 10 15# con bil/Ecc RLE only 15# Hamstring curl 2 x 15 25# bil Leg press 60# 15 x 2  Manual Therapy: Prone STW Gastroc  and distal hamstring   Modalities: Ice pack x 10 min right knee     [facility] Adult PT Treatment:                                                DATE: 07/25/2023 Therapeutic Exercise: Step down RLE only 3 x 10 Leg press 2 x 12 40#, 1 x 12 60# Knee extension machine 1 x 12 10#, 2 x 10 con bil/ Ecc RLE only 10# Hamstring curl  2 x 15 20# bil  Therapeutic Activity: Stair training on 6 in steps x 10  up / down with verbal cues/ demonstration for proper form Modalities: Ice pack x 10 min in supine   [facility] Adult PT Treatment:                                                DATE: 07/22/2023 Therapeutic Exercise: Recumbent bike L2 x 6 min - lowered seat at 2:30 to promote knee flexion Standing calf/ hamstring stretch via slant board 2 x 30 sec Standing TKE with ball against the wall 2 x 10 with weight shift to the R  Step up 2 x 10 6 inch RLE  Updated HEP for seated hamstring stretch and sit to stand exercise.  Manual Therapy: Patellar mobs superior/ inferior grade III MTRP along the lateral proximal gastroc, vastus lateralis   [facility] Adult PT Treatment:                                                DATE: 07/15/2023 Therapeutic Exercise: Recumbent bike L 1 x 5 min  Quad set 1 x 10 holding 5 seconds (verbal cues to keep up towards the ceiling) RL SLR 2 x 10 Bridge 2 x 10 with knees bent equally.  LAQ with ballsqueeze 2 x 10 for VMO activation Sit to stand 2 x 10  Seated hamstring curling 2 x 10 with GTB Sit to stand 1 x 10 Manual Therapy: MTPR the vastus lateralis x 3 Patellar mobs and how to perform at home Modalities:  Ice pack in supine with elevation x 10 min   PATIENT EDUCATION: 07/10/2023 Education details: Evaluation findings, POC, goals, HEP with proper form/ rationale. Person educated: Patient Education method: Explanation, Demonstration, Verbal cues, and Handouts Education comprehension: verbalized understanding  HOME EXERCISE PROGRAM: Access Code: [email or web address] URL: [email or web address] Date: 06/17/2023 Prepared by: [name]  Plantar Fascia Exercises  - Gastroc Stretch with Foot at Wall  - 3 x daily - 7 x weekly - 1-2 sets - 1-2 reps - 30-60 seconds hold - Soleus Stretch with Foot at Wall  - 3 x daily - 7 x weekly - 1 -2 sets - 1 - 2 reps - 30- 60 seconds hold -  Seated Ankle Eversion with Resistance  - 1 x daily - 7 x weekly - 2 sets - 10 reps - Seated Calf Stretch with Strap  - 1 x daily - 7 x weekly - 1 - 2 sets - 2 reps - 30 - 60 seconds hold - Seated Ankle Dorsiflexion with Resistance  - 1 x daily - 7 x weekly - 2 sets - 10 reps - Seated Ankle Inversion with Resistance  - 1 x daily - 7 x weekly - 2 sets - 10 reps - Seated Heel Raise  - 1 x daily - 7 x weekly - 2 sets - 15 reps - Gastroc Stretch on Wall  - 1 x daily - 7 x weekly - 1 sets - 3 reps - 20 hold - Standing Soleus Stretch  - 1 x daily - 7 x weekly - 1 sets - 3 reps - 20 hold - Seated Hip External Rotation with Resistance  - 1 x daily - 7 x weekly - 2 sets - 12 reps  Knee:  Access Code: [email or web address] URL: [email or web address] Date: 07/22/2023 Prepared by: [name]  Exercises - Supine Heel Slides  - 1 x daily - 7 x weekly - 2 sets - 10 reps - Supine Quad Set  - 1 x daily - 7 x weekly - 2 sets - 10 reps - SLR (Mirrored)  - 1 x daily - 7 x weekly - 2 sets - 10 reps - 1 hold - Supine Bridge  - 1 x daily - 7 x weekly - 2 sets - 10 reps - Modified Thomas Stretch  - 1 x daily - 7 x weekly - 2 sets - 2 reps - 30 seconds hold - Sit to Stand  - 1 x daily - 7 x weekly - 2 sets - 10 reps - Seated Hamstring Stretch  - 2 x daily - 7 x weekly - 2 sets - 2 reps - 30 hold ASSESSMENT:  CLINICAL IMPRESSION: 08/06/2023 Mr Graul arrives to PT today reporting overall doing better, mild medial knee pain today and tenderness in his calf. FOTO scores have improved for both knee and Foot. Continued with LE strengthening as tolerated. Pt reports no increased pain with therex.    Re-evaluation adding R Knee  Pt arrives to PT s/p R knee scope on 11/6. Since surgery he reports overall doing well, but does have pain and swelling which most noticeably is occurring along the anterolateral aspect of the R knee. He demonstrates limited knee ROM and weakness as a result of pain and stiffness. He  is TTPD and the anterior aspect of the knee. He is going to the MD tomorrow to get fluid pulled of his knee, per the pt report. He would benefit from physical therapy to reduce  R knee pain, improve ROM  and strength, and maximize his function by addressing the deficits listed.    R PF  EVALUATION: Patient is a 57 y.o. M who was seen today for physical therapy evaluation and treatment for dx of R Plantar fasciitis. Pt presents with R heel pain that started insidisously 6 months ago.  He has functional ankle ROM compared bil with weakness in R ankle DF/ inversion/ eversion with pain during assessment. TTP along bil gastroc/ soleus and posterior tib with R worse than left and specific point tenderness along the medial calcaneal tubercle. Patient responded well in session with MTPR along the R calf and posterior tib, followed with stretching calf strengthening. He noted relief in the calf and in the heel end of the session. Pt would benefit from physical therapy to decrease R heel pain, reduce calf tightness, improve gross LE strength, and maximize gait efficiency and overall function by addressing the deficits listed.   OBJECTIVE IMPAIRMENTS: Abnormal gait, decreased activity tolerance, decreased endurance, decreased strength, increased fascial restrictions, increased muscle spasms, improper body mechanics, postural dysfunction, and pain.   ACTIVITY LIMITATIONS: lifting, standing, squatting, and locomotion level  PARTICIPATION LIMITATIONS: community activity, occupation, and yard work  PERSONAL FACTORS: Age, Past/current experiences, and 1-2 comorbidities: hx of L PF tear, R knee surgery  are also affecting patient's functional outcome.   REHAB POTENTIAL: Good  CLINICAL DECISION MAKING: Evolving/moderate complexity  EVALUATION COMPLEXITY: Moderate   GOALS: Goals reviewed with patient? Yes  SHORT TERM GOALS: Target date: 07/01/2023   PT to be IND with initial HEP for therapeutic  progression Baseline: Goal status: MET 07/10/2023  2.  Pt to report pain to </= 7/10 max with getting out of bed in the AM for improvement in condition Baseline:  Goal status: Met 07/10/2023  3.  Pt to verbalize/ demo efficient gait pattern utilizing heel strike/ toe off with </= 5/10 max pain noted during gait. Baseline:  Goal status: MET 07/10/2023   LONG TERM GOALS: Target date: updated to 09/04/2023  Increase R calf gross strength to >/= 4+/5 to promote ankle strength/ stability with walking/ standing Baseline:  Goal status: ongoing 07/10/2023  2.  Pt to report max pain in the morning to </= 3/10 and during the day to </=1/10 pain for improvement in condition.  Baseline:  Goal status: ongoing 07/10/2023  3.  Improve FOTO (foot) score to >/= 70% to demo improvement in function Baseline:  08/06/23: 67% Foot Goal status: updated 11/20/204  4.  Pt to be able to sit/ stand and walk  and perform work related activities without limitation. Baseline:  Goal status: ongoing 07/10/2023  5.  Pt to be IND with all HEP and will is able to maintain and progress their current level of function IND. Baseline:  Goal status: ongoing 07/10/2023    6.  Increase knee total arc ROM to >/= 4 - 118 degrees for function ROM required for mobility for ADLS Baseline:  Goal status: INITIAL    7.  Increase R knee strength to >/= 4+/5 to promote stability with lifting and ADLS.  Baseline:  Goal status: INITIAL  8.  Pt be able to sit, stand and walk for >/= 1 hour with </= 2/10 max pain for functional endurance required for ADLS and work related activities  Baseline:  Goal status: INITIAL  PLAN:  PT FREQUENCY: 1-2x/week  PT DURATION: 8 weeks  PLANNED INTERVENTIONS: 97110-Therapeutic exercises, 97530- Therapeutic activity, [id number]- Neuromuscular re-education, 97535- Self Care, [id number]- Manual therapy, [id number]- Gait training, [id number]- Ultrasound,  16109- Ionotophoresis 4mg /ml Dexamethasone, Taping, Dry  Needling, Joint mobilization, Joint manipulation, Cryotherapy, and Moist heat  PLAN FOR NEXT SESSION: Review/ update HEP PRN. STW along the R gastroc/ soleus, consider DN if pt is up for it. Foot intrinsic strengthening, ankle extrinsic strengtheneing, calf stretching.  Knee ROM, STrengthening,  Jannette Spanner, PTA 08/06/23 12:50 PM Phone: [phone] Fax: [phone]

## 2023-08-07 ENCOUNTER — Other Ambulatory Visit: Payer: Self-pay | Admitting: Internal Medicine

## 2023-08-07 DIAGNOSIS — E1122 Type 2 diabetes mellitus with diabetic chronic kidney disease: Secondary | ICD-10-CM

## 2023-08-08 ENCOUNTER — Encounter: Payer: Self-pay | Admitting: Physical Therapy

## 2023-08-08 ENCOUNTER — Ambulatory Visit: Payer: 59 | Admitting: Physical Therapy

## 2023-08-08 DIAGNOSIS — M6281 Muscle weakness (generalized): Secondary | ICD-10-CM

## 2023-08-08 DIAGNOSIS — M25571 Pain in right ankle and joints of right foot: Secondary | ICD-10-CM | POA: Diagnosis not present

## 2023-08-08 DIAGNOSIS — R2689 Other abnormalities of gait and mobility: Secondary | ICD-10-CM

## 2023-08-08 NOTE — Therapy (Signed)
OUTPATIENT PHYSICAL THERAPY TREATMENT   Patient Name: Mitchell Palmer MRN: [id number] DOB:07/05/1966, 57 y.o., male Today's Date: 08/08/2023  END OF SESSION:  PT End of Session - 08/08/23 1100     Visit Number 12    Number of Visits 17    Date for PT Re-Evaluation 09/04/23    Authorization Type [employer]    PT Start Time 1100    PT Stop Time 1150    PT Time Calculation (min) 50 min                   Past Medical History:  Diagnosis Date   Allergy    allegic rhinitis   Diabetes mellitus    type II   Esophageal stricture    GERD (gastroesophageal reflux disease)    History of hernia repair    as a baby   Hyperlipidemia    Hyperplastic polyp of intestine 2010   Hypertension    OSA (obstructive sleep apnea) 04/18/2022   Proliferative diabetic retinopathy of left eye (HCC) 03/31/2020   This is active as a patient still has tufts of the anterior segment neovascularization, rubeosis in the left eye every region of capillary nonperfusion should be treated with PRP.   S/P ear surgery, follow-up exam    for drainage   Vitreous hemorrhage of right eye (HCC) 12/24/2019   Past Surgical History:  Procedure Laterality Date   BIOPSY  12/07/2021   Procedure: BIOPSY;  Surgeon: Meryl Dare, MD;  Location: [facility];  Service: Gastroenterology;;   CATARACT EXTRACTION     COLONOSCOPY  03/07/2016   COLONOSCOPY WITH PROPOFOL N/A 12/07/2021   Procedure: COLONOSCOPY WITH PROPOFOL;  Surgeon: Meryl Dare, MD;  Location: [facility];  Service: Gastroenterology;  Laterality: N/A;   ESOPHAGOGASTRODUODENOSCOPY  04/20/2009   stricture/GERD   ESOPHAGOGASTRODUODENOSCOPY (EGD) WITH PROPOFOL N/A 12/07/2021   Procedure: ESOPHAGOGASTRODUODENOSCOPY (EGD) WITH PROPOFOL;  Surgeon: Meryl Dare, MD;  Location: [facility];  Service: Gastroenterology;  Laterality: N/A;   EYE SURGERY     HERNIA REPAIR     age 7-midline   INNER EAR SURGERY  08/20/1986   went in behind rt ear-blockage    KNEE ARTHROSCOPY Right 2023   NECK SURGERY     [facility]   POLYPECTOMY     SAVORY DILATION N/A 12/07/2021   Procedure: SAVORY DILATION;  Surgeon: Meryl Dare, MD;  Location: [facility];  Service: Gastroenterology;  Laterality: N/A;   SHOULDER ARTHROSCOPY WITH ROTATOR CUFF REPAIR AND SUBACROMIAL DECOMPRESSION Right 09/29/2012   Procedure: SHOULDER ARTHROSCOPY WITH ROTATOR CUFF REPAIR AND SUBACROMIAL DECOMPRESSION;  Surgeon: Mable Paris, MD;  Location: [facility];  Service: Orthopedics;  Laterality: Right;  Right shoulder arthroscopy with subacromial decompression and distal clavicle excision & Debridement of Labrial Tear   SHOULDER SURGERY Left    x2   UPPER GASTROINTESTINAL ENDOSCOPY     Patient Active Problem List   Diagnosis Date Noted   Peripheral neuropathy 02/08/2023   Heel pain, bilateral 02/08/2023   Dyspnea on exertion 07/30/2022   OSA (obstructive sleep apnea) 04/18/2022   Loud snoring 12/12/2021   Family history of colon cancer    Lump on finger 11/06/2021   Pseudophakia, left eye 10/26/2021   Pseudophakia, right eye 10/26/2021   Vitreomacular adhesion of left eye 09/21/2021   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/2021   Vitreous hemorrhage, left eye (HCC) 06/07/2021   Proliferative diabetic retinopathy of left eye with macular edema associated with type 2  diabetes mellitus (HCC) 06/07/2021   Localized swelling, mass and lump, upper limb 05/31/2021   Low vitamin D level 09/14/2020   Prostate cancer screening 09/02/2020   Current use of proton pump inhibitor 09/02/2020   Family history of factor V Leiden mutation 05/30/2020   Left ankle pain 01/29/2020   Obesity (BMI 30-39.9) 12/24/2019   Proliferative diabetic retinopathy of left eye determined by examination (HCC) 12/24/2019   Nuclear sclerotic cataract of left eye 12/24/2019   Low testosterone 11/13/2019   Fatigue 10/20/2019   Neck pain 04/28/2018   Need for hepatitis B screening  test 03/26/2018   Need for hepatitis C screening test 03/26/2018   Paresthesia of both feet 11/01/2017   Hemorrhoids 10/14/2017   Class 2 obesity due to excess calories with body mass index (BMI) of 35.0 to 35.9 in adult 01/24/2016   Hypothyroidism 01/24/2016   Type 2 diabetes mellitus with ophthalmic complication (HCC) 07/28/2015   Alcohol consumption heavy 12/06/2014   Routine general medical examination at a health care facility 09/30/2013   Low back pain 06/17/2012   PLANTAR FASCIITIS, TRAUMATIC 11/09/2009   NEOPLASM UNCERTAIN BEHAVIOR OTHER SPEC SITES 10/07/2009   ESOPHAGEAL STRICTURE 06/27/2009   GERD 03/31/2009   Hyperlipidemia associated with type 2 diabetes mellitus (HCC) 01/06/2009   Essential hypertension 01/06/2009   Allergic rhinitis 01/06/2009    PCP: Judy Pimple, MD  REFERRING PROVIDER: Louann Sjogren, DPM           Knee: Jodi Geralds  MD  REFERRING DIAG: Plantar fasciitis, right [M72.2]         S/p R knee Arthroscopy      THERAPY DIAG:  Pain in right ankle and joints of right foot  Muscle weakness (generalized)  Other abnormalities of gait and mobility  Rationale for Evaluation and Treatment: Rehabilitation  ONSET DATE: 6 months for R plantar fasciitis         06/26/2023 R Knee arthroscopy         SUBJECTIVE:   SUBJECTIVE STATEMENT: 2/10 knee and the calf tenderness is still there.      PERTINENT HISTORY: See PMHx  PAIN: R knee  Are you having pain? Yes: NPRS scale: 2/10 Pain location: Lateral aspect of the knee Pain description: sharp/ aching Aggravating factors: getting out of bed in the AM, getting out of chair initially Relieving factors:  N/A   R PF Are you having pain? Yes: NPRS scale: 0/10 Pain location: R calf  Pain description: sharpness Aggravating factors: getting out of bed in the morning Relieving factors: ankle brace, stretch    PRECAUTIONS: None  RED FLAGS: None   WEIGHT BEARING RESTRICTIONS: No  FALLS:   Has patient fallen in last 6 months? No  LIVING ENVIRONMENT: Lives with: lives with their family Lives in: House/apartment Stairs: Yes: External: 3 steps; none Has following equipment at home:  Brace  OCCUPATION: [place] [employer] [place],  PLOF: Independent with basic ADLs  PATIENT GOALS: stretching, relieving pain.    OBJECTIVE:  Note: Objective measures were completed at Evaluation unless otherwise noted.  DIAGNOSTIC FINDINGS:at MD's office.   PATIENT SURVEYS:  FOTO 65%  predicted 70% 08/06/23: 67% (11 th visit)   11/20/224 knee 54% predicted 70% 08/06/23:  61% (6th visit)   COGNITION: Overall cognitive status: Within functional limits for tasks assessed     SENSATION: WFL  POSTURE: rounded shoulders and forward head  PALPATION: TTP along the  bil with R>L calf/ gastroc and posterior tibialis and at the R medial  calcaneal tubercle.   R Knee 07/10/23 Pocket of swelling superolaterlal aspect of the lateral joint of the femur. And TTP along the anterior aspect of the knee.    R knee Edema joint line:39.5 mm  L knee edema joint line: 37 mm  LOWER EXTREMITY ROM:     Active  Right Re-eval Left Re-eval Right 07/15/2023 Right 07/22/2023 Right  07/30/23 08/08/23 Right  Hip flexion        Hip extension        Hip abduction        Hip adduction        Hip internal rotation        Hip external rotation        Knee flexion 112*  118 119  130  Knee extension 8 *  6 8 6  pulling posterior knee  3  Ankle dorsiflexion        Ankle plantarflexion        Ankle inversion        Ankle eversion         (Blank rows = not tested)    Notes: Pain and Stiffness at end range flexion,    LOWER EXTREMITY MMT:  MMT Right eval Left eval 07/10/2023 Re-eval 07/10/2023 Re-eval 08/06/23:  Right  Hip flexion   4+ 4+   Hip extension   4+ 4+   Hip abduction   4+ 4+   Hip adduction       Hip internal rotation       Hip external rotation       Knee flexion   4-* 4+   Knee  extension   4-* 4+   Ankle dorsiflexion 4+ P! 5   5  Ankle plantarflexion 5 5     Ankle inversion 4+ P! 5   5 p  Ankle eversion 3+  5   4+ P   (Blank rows = not tested)       Notes: Pain with testing with weakness with R knee extension/   GAIT: Distance walked: 120 from waiting room to tx area Assistive device utilized: None Level of assistance: Complete Independence Comments: antalgic gait pattern with limited heel strike on the R compared bil   TREATMENT:                                                                                                                               [facility] Adult PT Treatment:                                                DATE: 08/08/23 Therapeutic Exercise: SLR 15 x 2  Supine GTB ankle inv, ev, Df x 20 each way  Knee ext 10# bilat with eccentric Rt x 15, x 10 Knee flex 25# with bilat and eccentric right,  x 15, x 10 Bilat heel raise with right eccentric lower x 15 Slant board SLS on Foam Tandem on foam  4 inch step down with retro step up x 10 4 inch step down heel strike X 10 Right QS 5 sec x 10   SL bridge x 15 each SAQ with ball squeeze STS 15# 10 x 2  Modalities: Ice pack right knee x 10 minutes per pt request    [facility] Adult PT Treatment:                                                DATE: 08/06/23 Therapeutic Exercise: Standing TKE blue band  Ankle inv, ev, DF with green band x 15 each Heel raise with Right eccentric lower Gastroc  stretch Soleus stretch Knee ext 10# bilat with eccentric Rt Knee flex 25# with bilat and eccentric right STS 10# 10 x 2  6 inch step up x 12 6 inch lateral step down x 12 Single leg bridge x 15  SAQ with Ball Squeeze 15 x 2  SLR with ER 2# x 15 x 10  Therapeutic Activity: FOTO updated for Knee and Foot     [facility] Adult PT Treatment:                                                DATE: 07/30/23 Therapeutic Exercise: Supine Qs- puling posterior knee and calf Prone knee hang x 2 minutes  Runners  stretch right Standing TKE green band in doorway 10 RLE forward, x 10 back Knee extension machine 1 x 12 10# con bil/ Ecc RLE only 10#, 10 x 10 15# con bil/Ecc RLE only 15# Hamstring curl 2 x 15 25# bil Leg press 60# 15 x 2  Manual Therapy: Prone STW Gastroc  and distal hamstring   Modalities: Ice pack x 10 min right knee     [facility] Adult PT Treatment:                                                DATE: 07/25/2023 Therapeutic Exercise: Step down RLE only 3 x 10 Leg press 2 x 12 40#, 1 x 12 60# Knee extension machine 1 x 12 10#, 2 x 10 con bil/ Ecc RLE only 10# Hamstring curl 2 x 15 20# bil  Therapeutic Activity: Stair training on 6 in steps x 10  up / down with verbal cues/ demonstration for proper form Modalities: Ice pack x 10 min in supine   [facility] Adult PT Treatment:                                                DATE: 07/22/2023 Therapeutic Exercise: Recumbent bike L2 x 6 min - lowered seat at 2:30 to promote knee flexion Standing calf/ hamstring stretch via slant board 2 x 30 sec Standing TKE with ball against the wall 2 x 10 with weight shift to the R  Step up 2 x  10 6 inch RLE  Updated HEP for seated hamstring stretch and sit to stand exercise.  Manual Therapy: Patellar mobs superior/ inferior grade III MTRP along the lateral proximal gastroc, vastus lateralis   [facility] Adult PT Treatment:                                                DATE: 07/15/2023 Therapeutic Exercise: Recumbent bike L 1 x 5 min  Quad set 1 x 10 holding 5 seconds (verbal cues to keep up towards the ceiling) RL SLR 2 x 10 Bridge 2 x 10 with knees bent equally.  LAQ with ballsqueeze 2 x 10 for VMO activation Sit to stand 2 x 10  Seated hamstring curling 2 x 10 with GTB Sit to stand 1 x 10 Manual Therapy: MTPR the vastus lateralis x 3 Patellar mobs and how to perform at home Modalities: Ice pack in supine with elevation x 10 min   PATIENT EDUCATION: 07/10/2023 Education details: Evaluation  findings, POC, goals, HEP with proper form/ rationale. Person educated: Patient Education method: Explanation, Demonstration, Verbal cues, and Handouts Education comprehension: verbalized understanding  HOME EXERCISE PROGRAM: Access Code: [email or web address] URL: [email or web address] Date: 06/17/2023 Prepared by: [name]  Plantar Fascia Exercises  - Gastroc Stretch with Foot at Wall  - 3 x daily - 7 x weekly - 1-2 sets - 1-2 reps - 30-60 seconds hold - Soleus Stretch with Foot at Wall  - 3 x daily - 7 x weekly - 1 -2 sets - 1 - 2 reps - 30- 60 seconds hold - Seated Ankle Eversion with Resistance  - 1 x daily - 7 x weekly - 2 sets - 10 reps - Seated Calf Stretch with Strap  - 1 x daily - 7 x weekly - 1 - 2 sets - 2 reps - 30 - 60 seconds hold - Seated Ankle Dorsiflexion with Resistance  - 1 x daily - 7 x weekly - 2 sets - 10 reps - Seated Ankle Inversion with Resistance  - 1 x daily - 7 x weekly - 2 sets - 10 reps - Seated Heel Raise  - 1 x daily - 7 x weekly - 2 sets - 15 reps - Gastroc Stretch on Wall  - 1 x daily - 7 x weekly - 1 sets - 3 reps - 20 hold - Standing Soleus Stretch  - 1 x daily - 7 x weekly - 1 sets - 3 reps - 20 hold - Seated Hip External Rotation with Resistance  - 1 x daily - 7 x weekly - 2 sets - 12 reps  Knee:  Access Code: [email or web address] URL: [email or web address] Date: 07/22/2023 Prepared by: [name]  Exercises - Supine Heel Slides  - 1 x daily - 7 x weekly - 2 sets - 10 reps - Supine Quad Set  - 1 x daily - 7 x weekly - 2 sets - 10 reps - SLR (Mirrored)  - 1 x daily - 7 x weekly - 2 sets - 10 reps - 1 hold - Supine Bridge  - 1 x daily - 7 x weekly - 2 sets - 10 reps - Modified Thomas Stretch  - 1 x daily - 7 x weekly - 2 sets - 2 reps - 30 seconds hold - Sit to Stand  - 1 x daily - 7 x weekly -  2 sets - 10 reps - Seated Hamstring Stretch  - 2 x daily - 7 x weekly - 2 sets - 2 reps - 30 hold ASSESSMENT:  CLINICAL  IMPRESSION: 08/08/2023 Pt reports he was on his feet for 2-3 hours yesterday and did not have increased knee pain but he was leaning on a tree to take weight off of his RLE. He does feel less secure when he is not wearing his knee brace. In clinic he demonstrates improved eccentric control with step downs. His AROM has improved to target, LTG# 6 Met. Continued with LE strengthening and ankle stability. He did not complain of increased pain but did request ice at end of session.    Re-evaluation adding R Knee  Pt arrives to PT s/p R knee scope on 11/6. Since surgery he reports overall doing well, but does have pain and swelling which most noticeably is occurring along the anterolateral aspect of the R knee. He demonstrates limited knee ROM and weakness as a result of pain and stiffness. He is TTPD and the anterior aspect of the knee. He is going to the MD tomorrow to get fluid pulled of his knee, per the pt report. He would benefit from physical therapy to reduce  R knee pain, improve ROM and strength, and maximize his function by addressing the deficits listed.    R PF  EVALUATION: Patient is a 58 y.o. M who was seen today for physical therapy evaluation and treatment for dx of R Plantar fasciitis. Pt presents with R heel pain that started insidisously 6 months ago.  He has functional ankle ROM compared bil with weakness in R ankle DF/ inversion/ eversion with pain during assessment. TTP along bil gastroc/ soleus and posterior tib with R worse than left and specific point tenderness along the medial calcaneal tubercle. Patient responded well in session with MTPR along the R calf and posterior tib, followed with stretching calf strengthening. He noted relief in the calf and in the heel end of the session. Pt would benefit from physical therapy to decrease R heel pain, reduce calf tightness, improve gross LE strength, and maximize gait efficiency and overall function by addressing the deficits listed.    OBJECTIVE IMPAIRMENTS: Abnormal gait, decreased activity tolerance, decreased endurance, decreased strength, increased fascial restrictions, increased muscle spasms, improper body mechanics, postural dysfunction, and pain.   ACTIVITY LIMITATIONS: lifting, standing, squatting, and locomotion level  PARTICIPATION LIMITATIONS: community activity, occupation, and yard work  PERSONAL FACTORS: Age, Past/current experiences, and 1-2 comorbidities: hx of L PF tear, R knee surgery  are also affecting patient's functional outcome.   REHAB POTENTIAL: Good  CLINICAL DECISION MAKING: Evolving/moderate complexity  EVALUATION COMPLEXITY: Moderate   GOALS: Goals reviewed with patient? Yes  SHORT TERM GOALS: Target date: 07/01/2023   PT to be IND with initial HEP for therapeutic progression Baseline: Goal status: MET 07/10/2023  2.  Pt to report pain to </= 7/10 max with getting out of bed in the AM for improvement in condition Baseline:  Goal status: Met 07/10/2023  3.  Pt to verbalize/ demo efficient gait pattern utilizing heel strike/ toe off with </= 5/10 max pain noted during gait. Baseline:  Goal status: MET 07/10/2023   LONG TERM GOALS: Target date: updated to 09/04/2023  Increase R calf gross strength to >/= 4+/5 to promote ankle strength/ stability with walking/ standing Baseline:  Goal status: ongoing 07/10/2023  2.  Pt to report max pain in the morning to </= 3/10 and during  the day to </=1/10 pain for improvement in condition.  Baseline:  Goal status: ongoing 07/10/2023  3.  Improve FOTO (foot) score to >/= 70% to demo improvement in function Baseline:  08/06/23: 67% Foot Goal status: updated 11/20/204  4.  Pt to be able to sit/ stand and walk  and perform work related activities without limitation. Baseline:  Goal status: ongoing 07/10/2023  5.  Pt to be IND with all HEP and will is able to maintain and progress their current level of function IND. Baseline:   Goal status: ongoing 07/10/2023    6.  Increase knee total arc ROM to >/= 4 - 118 degrees for function ROM required for mobility for ADLS Baseline:  08/08/23: 3-875 Goal status: MET    7.  Increase R knee strength to >/= 4+/5 to promote stability with lifting and ADLS.  Baseline:  Goal status: INITIAL  8.  Pt be able to sit, stand and walk for >/= 1 hour with </= 2/10 max pain for functional endurance required for ADLS and work related activities  Baseline:  Goal status: INITIAL  PLAN:  PT FREQUENCY: 1-2x/week  PT DURATION: 8 weeks  PLANNED INTERVENTIONS: 97110-Therapeutic exercises, 97530- Therapeutic activity, [id number]- Neuromuscular re-education, 97535- Self Care, [id number]- Manual therapy, [id number]- Gait training, [id number]- Ultrasound, [id number]- Ionotophoresis 4mg /ml Dexamethasone, Taping, Dry Needling, Joint mobilization, Joint manipulation, Cryotherapy, and Moist heat  PLAN FOR NEXT SESSION: Review/ update HEP PRN. STW along the R gastroc/ soleus, consider DN if pt is up for it. Foot intrinsic strengthening, ankle extrinsic strengtheneing, calf stretching.  Knee ROM, STrengthening,  Jannette Spanner, PTA 08/08/23 12:07 PM Phone: [phone] Fax: [phone]

## 2023-08-13 ENCOUNTER — Ambulatory Visit: Payer: 59 | Admitting: Physical Therapy

## 2023-08-13 ENCOUNTER — Encounter: Payer: Self-pay | Admitting: Physical Therapy

## 2023-08-13 DIAGNOSIS — M25571 Pain in right ankle and joints of right foot: Secondary | ICD-10-CM

## 2023-08-13 DIAGNOSIS — M25511 Pain in right shoulder: Secondary | ICD-10-CM

## 2023-08-13 DIAGNOSIS — M6281 Muscle weakness (generalized): Secondary | ICD-10-CM

## 2023-08-13 DIAGNOSIS — R2689 Other abnormalities of gait and mobility: Secondary | ICD-10-CM

## 2023-08-13 NOTE — Therapy (Signed)
OUTPATIENT PHYSICAL THERAPY TREATMENT   Patient Name: Mitchell Palmer MRN: [id number] DOB:Apr 07, 1966, 57 y.o., male Today's Date: 08/13/2023  END OF SESSION:  PT End of Session - 08/13/23 1106     Visit Number 13    Number of Visits 17    Date for PT Re-Evaluation 09/04/23    Authorization Type [employer]    PT Start Time 1106    PT Stop Time 1156    PT Time Calculation (min) 50 min    Activity Tolerance Patient tolerated treatment well    Behavior During Therapy WFL for tasks assessed/performed                    Past Medical History:  Diagnosis Date   Allergy    allegic rhinitis   Diabetes mellitus    type II   Esophageal stricture    GERD (gastroesophageal reflux disease)    History of hernia repair    as a baby   Hyperlipidemia    Hyperplastic polyp of intestine 2010   Hypertension    OSA (obstructive sleep apnea) 04/18/2022   Proliferative diabetic retinopathy of left eye (HCC) 03/31/2020   This is active as a patient still has tufts of the anterior segment neovascularization, rubeosis in the left eye every region of capillary nonperfusion should be treated with PRP.   S/P ear surgery, follow-up exam    for drainage   Vitreous hemorrhage of right eye (HCC) 12/24/2019   Past Surgical History:  Procedure Laterality Date   BIOPSY  12/07/2021   Procedure: BIOPSY;  Surgeon: Meryl Dare, MD;  Location: [facility];  Service: Gastroenterology;;   CATARACT EXTRACTION     COLONOSCOPY  03/07/2016   COLONOSCOPY WITH PROPOFOL N/A 12/07/2021   Procedure: COLONOSCOPY WITH PROPOFOL;  Surgeon: Meryl Dare, MD;  Location: [facility];  Service: Gastroenterology;  Laterality: N/A;   ESOPHAGOGASTRODUODENOSCOPY  04/20/2009   stricture/GERD   ESOPHAGOGASTRODUODENOSCOPY (EGD) WITH PROPOFOL N/A 12/07/2021   Procedure: ESOPHAGOGASTRODUODENOSCOPY (EGD) WITH PROPOFOL;  Surgeon: Meryl Dare, MD;  Location: [facility];  Service: Gastroenterology;  Laterality: N/A;    EYE SURGERY     HERNIA REPAIR     age 35-midline   INNER EAR SURGERY  08/20/1986   went in behind rt ear-blockage   KNEE ARTHROSCOPY Right 2023   NECK SURGERY     [facility]   POLYPECTOMY     SAVORY DILATION N/A 12/07/2021   Procedure: SAVORY DILATION;  Surgeon: Meryl Dare, MD;  Location: [facility];  Service: Gastroenterology;  Laterality: N/A;   SHOULDER ARTHROSCOPY WITH ROTATOR CUFF REPAIR AND SUBACROMIAL DECOMPRESSION Right 09/29/2012   Procedure: SHOULDER ARTHROSCOPY WITH ROTATOR CUFF REPAIR AND SUBACROMIAL DECOMPRESSION;  Surgeon: Mable Paris, MD;  Location: [facility];  Service: Orthopedics;  Laterality: Right;  Right shoulder arthroscopy with subacromial decompression and distal clavicle excision & Debridement of Labrial Tear   SHOULDER SURGERY Left    x2   UPPER GASTROINTESTINAL ENDOSCOPY     Patient Active Problem List   Diagnosis Date Noted   Peripheral neuropathy 02/08/2023   Heel pain, bilateral 02/08/2023   Dyspnea on exertion 07/30/2022   OSA (obstructive sleep apnea) 04/18/2022   Loud snoring 12/12/2021   Family history of colon cancer    Lump on finger 11/06/2021   Pseudophakia, left eye 10/26/2021   Pseudophakia, right eye 10/26/2021   Vitreomacular adhesion of left eye 09/21/2021   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/2021   Vitreous  hemorrhage, left eye (HCC) 06/07/2021   Proliferative diabetic retinopathy of left eye with macular edema associated with type 2 diabetes mellitus (HCC) 06/07/2021   Localized swelling, mass and lump, upper limb 05/31/2021   Low vitamin D level 09/14/2020   Prostate cancer screening 09/02/2020   Current use of proton pump inhibitor 09/02/2020   Family history of factor V Leiden mutation 05/30/2020   Left ankle pain 01/29/2020   Obesity (BMI 30-39.9) 12/24/2019   Proliferative diabetic retinopathy of left eye determined by examination (HCC) 12/24/2019   Nuclear sclerotic cataract of left eye  12/24/2019   Low testosterone 11/13/2019   Fatigue 10/20/2019   Neck pain 04/28/2018   Need for hepatitis B screening test 03/26/2018   Need for hepatitis C screening test 03/26/2018   Paresthesia of both feet 11/01/2017   Hemorrhoids 10/14/2017   Class 2 obesity due to excess calories with body mass index (BMI) of 35.0 to 35.9 in adult 01/24/2016   Hypothyroidism 01/24/2016   Type 2 diabetes mellitus with ophthalmic complication (HCC) 07/28/2015   Alcohol consumption heavy 12/06/2014   Routine general medical examination at a health care facility 09/30/2013   Low back pain 06/17/2012   PLANTAR FASCIITIS, TRAUMATIC 11/09/2009   NEOPLASM UNCERTAIN BEHAVIOR OTHER SPEC SITES 10/07/2009   ESOPHAGEAL STRICTURE 06/27/2009   GERD 03/31/2009   Hyperlipidemia associated with type 2 diabetes mellitus (HCC) 01/06/2009   Essential hypertension 01/06/2009   Allergic rhinitis 01/06/2009    PCP: Judy Pimple, MD  REFERRING PROVIDER: Louann Sjogren, DPM           Knee: Jodi Geralds  MD  REFERRING DIAG: Plantar fasciitis, right [M72.2]         S/p R knee Arthroscopy      THERAPY DIAG:  Pain in right ankle and joints of right foot  Muscle weakness (generalized)  Other abnormalities of gait and mobility  Acute pain of right shoulder  Rationale for Evaluation and Treatment: Rehabilitation  ONSET DATE: 6 months for R plantar fasciitis         06/26/2023 R Knee arthroscopy         SUBJECTIVE:   SUBJECTIVE STATEMENT: " I am feeling more tenderness in the bottom of the calf probably 2/10"     PERTINENT HISTORY: See PMHx  PAIN: R knee  Are you having pain? Yes: NPRS scale: 2/10 Pain location: Lateral aspect of the knee Pain description: sharp/ aching Aggravating factors: any time  Relieving factors:  N/A   R PF Are you having pain? Yes: NPRS scale: 0/10 Pain location: R calf  Pain description: sharpness Aggravating factors: getting out of bed in the morning Relieving  factors: ankle brace, stretch    PRECAUTIONS: None  RED FLAGS: None   WEIGHT BEARING RESTRICTIONS: No  FALLS:  Has patient fallen in last 6 months? No  LIVING ENVIRONMENT: Lives with: lives with their family Lives in: House/apartment Stairs: Yes: External: 3 steps; none Has following equipment at home:  Brace  OCCUPATION: city [employer] [place],  PLOF: Independent with basic ADLs  PATIENT GOALS: stretching, relieving pain.    OBJECTIVE:  Note: Objective measures were completed at Evaluation unless otherwise noted.  DIAGNOSTIC FINDINGS:at MD's office.   PATIENT SURVEYS:  FOTO 65%  predicted 70% 08/06/23: 67% (11 th visit)   11/20/224 knee 54% predicted 70% 08/06/23:  61% (6th visit)   COGNITION: Overall cognitive status: Within functional limits for tasks assessed     SENSATION: WFL  POSTURE: rounded shoulders and  forward head  PALPATION: TTP along the  bil with R>L calf/ gastroc and posterior tibialis and at the R medial calcaneal tubercle.   R Knee 07/10/23 Pocket of swelling superolaterlal aspect of the lateral joint of the femur. And TTP along the anterior aspect of the knee.    R knee Edema joint line:39.5 mm  L knee edema joint line: 37 mm  LOWER EXTREMITY ROM:     Active  Right Re-eval Left Re-eval Right 07/15/2023 Right 07/22/2023 Right  07/30/23 08/08/23 Right  Hip flexion        Hip extension        Hip abduction        Hip adduction        Hip internal rotation        Hip external rotation        Knee flexion 112*  118 119  130  Knee extension 8 *  6 8 6  pulling posterior knee  3  Ankle dorsiflexion        Ankle plantarflexion        Ankle inversion        Ankle eversion         (Blank rows = not tested)    Notes: Pain and Stiffness at end range flexion,    LOWER EXTREMITY MMT:  MMT Right eval Left eval 07/10/2023 Re-eval 07/10/2023 Re-eval 08/06/23:  Right  Hip flexion   4+ 4+   Hip extension   4+ 4+   Hip abduction    4+ 4+   Hip adduction       Hip internal rotation       Hip external rotation       Knee flexion   4-* 4+   Knee extension   4-* 4+   Ankle dorsiflexion 4+ P! 5   5  Ankle plantarflexion 5 5     Ankle inversion 4+ P! 5   5 p  Ankle eversion 3+  5   4+ P   (Blank rows = not tested)       Notes: Pain with testing with weakness with R knee extension/   GAIT: Distance walked: 120 from waiting room to tx area Assistive device utilized: None Level of assistance: Complete Independence Comments: antalgic gait pattern with limited heel strike on the R compared bil   TREATMENT:                                                                                                                               [facility] Adult PT Treatment:                                                DATE: 08/13/23 Therapeutic Exercise: Elliptical L1 x 5 min ramp L1 Slant board calf stretch knee bent/ straight  2 each hold both 30 sec Step up onto bosue with RLE weight shift to the LLE, and stepping back off with RLE x 2 Mini lunge with lead foot on bosu near leg press for UE support PRN 1 x 10 bil Manual Therapy: MTPR along the R soleus/ gastroc Neuromuscular re-ed: Top taps on bosu softly alternating L/R x 2 x 10, progressed to circling bosu CW/CCW 2 x ea with toe taps Modalities: Ice pack R knee in supine per pt request x 8 min   [facility] Adult PT Treatment:                                                DATE: 08/08/23 Therapeutic Exercise: SLR 15 x 2  Supine GTB ankle inv, ev, Df x 20 each way  Knee ext 10# bilat with eccentric Rt x 15, x 10 Knee flex 25# with bilat and eccentric right, x 15, x 10 Bilat heel raise with right eccentric lower x 15 Slant board SLS on Foam Tandem on foam  4 inch step down with retro step up x 10 4 inch step down heel strike X 10 Right QS 5 sec x 10   SL bridge x 15 each SAQ with ball squeeze STS 15# 10 x 2  Modalities: Ice pack right knee x 10 minutes per pt request  [facility]  Adult PT Treatment:                                                DATE: 08/06/23 Therapeutic Exercise: Standing TKE blue band  Ankle inv, ev, DF with green band x 15 each Heel raise with Right eccentric lower Gastroc  stretch Soleus stretch Knee ext 10# bilat with eccentric Rt Knee flex 25# with bilat and eccentric right STS 10# 10 x 2  6 inch step up x 12 6 inch lateral step down x 12 Single leg bridge x 15  SAQ with Ball Squeeze 15 x 2  SLR with ER 2# x 15 x 10  Therapeutic Activity: FOTO updated for Knee and Foot    PATIENT EDUCATION: 07/10/2023 Education details: Evaluation findings, POC, goals, HEP with proper form/ rationale. Person educated: Patient Education method: Explanation, Demonstration, Verbal cues, and Handouts Education comprehension: verbalized understanding  HOME EXERCISE PROGRAM: Access Code: [email or web address] URL: [email or web address] Date: 06/17/2023 Prepared by: [name]  Plantar Fascia Exercises  - Gastroc Stretch with Foot at Wall  - 3 x daily - 7 x weekly - 1-2 sets - 1-2 reps - 30-60 seconds hold - Soleus Stretch with Foot at Wall  - 3 x daily - 7 x weekly - 1 -2 sets - 1 - 2 reps - 30- 60 seconds hold - Seated Ankle Eversion with Resistance  - 1 x daily - 7 x weekly - 2 sets - 10 reps - Seated Calf Stretch with Strap  - 1 x daily - 7 x weekly - 1 - 2 sets - 2 reps - 30 - 60 seconds hold - Seated Ankle Dorsiflexion with Resistance  - 1 x daily - 7 x weekly - 2 sets - 10 reps - Seated Ankle Inversion with Resistance  - 1 x daily - 7 x weekly - 2 sets -  10 reps - Seated Heel Raise  - 1 x daily - 7 x weekly - 2 sets - 15 reps - Gastroc Stretch on Wall  - 1 x daily - 7 x weekly - 1 sets - 3 reps - 20 hold - Standing Soleus Stretch  - 1 x daily - 7 x weekly - 1 sets - 3 reps - 20 hold - Seated Hip External Rotation with Resistance  - 1 x daily - 7 x weekly - 2 sets - 12 reps  Knee:  Access Code: [email or web address] URL:  [email or web address] Date: 07/22/2023 Prepared by: [name]  Exercises - Supine Heel Slides  - 1 x daily - 7 x weekly - 2 sets - 10 reps - Supine Quad Set  - 1 x daily - 7 x weekly - 2 sets - 10 reps - SLR (Mirrored)  - 1 x daily - 7 x weekly - 2 sets - 10 reps - 1 hold - Supine Bridge  - 1 x daily - 7 x weekly - 2 sets - 10 reps - Modified Thomas Stretch  - 1 x daily - 7 x weekly - 2 sets - 2 reps - 30 seconds hold - Sit to Stand  - 1 x daily - 7 x weekly - 2 sets - 10 reps - Seated Hamstring Stretch  - 2 x daily - 7 x weekly - 2 sets - 2 reps - 30 hold ASSESSMENT:  CLINICAL IMPRESSION: 08/13/2023 Tamim arrives to session reporting 2/10 soreness mostly at the distal musculotendinous junction of the R calf. He responded well to MTPR of the R calf to relieve tension. Progressed to elliptical today which he did well with but also fatigued with noting it took it out of him. Continued remained of session working strengthening/ balance training utilizing bosu to promote stability which he did well with overall. End of session he he reported no increase in pain but did request an ice pack.     Re-evaluation adding R Knee  Pt arrives to PT s/p R knee scope on 11/6. Since surgery he reports overall doing well, but does have pain and swelling which most noticeably is occurring along the anterolateral aspect of the R knee. He demonstrates limited knee ROM and weakness as a result of pain and stiffness. He is TTPD and the anterior aspect of the knee. He is going to the MD tomorrow to get fluid pulled of his knee, per the pt report. He would benefit from physical therapy to reduce  R knee pain, improve ROM and strength, and maximize his function by addressing the deficits listed.    R PF  EVALUATION: Patient is a 57 y.o. M who was seen today for physical therapy evaluation and treatment for dx of R Plantar fasciitis. Pt presents with R heel pain that started insidisously 6 months  ago.  He has functional ankle ROM compared bil with weakness in R ankle DF/ inversion/ eversion with pain during assessment. TTP along bil gastroc/ soleus and posterior tib with R worse than left and specific point tenderness along the medial calcaneal tubercle. Patient responded well in session with MTPR along the R calf and posterior tib, followed with stretching calf strengthening. He noted relief in the calf and in the heel end of the session. Pt would benefit from physical therapy to decrease R heel pain, reduce calf tightness, improve gross LE strength, and maximize gait efficiency and overall function by addressing the deficits listed.   OBJECTIVE IMPAIRMENTS: Abnormal gait,  decreased activity tolerance, decreased endurance, decreased strength, increased fascial restrictions, increased muscle spasms, improper body mechanics, postural dysfunction, and pain.   ACTIVITY LIMITATIONS: lifting, standing, squatting, and locomotion level  PARTICIPATION LIMITATIONS: community activity, occupation, and yard work  PERSONAL FACTORS: Age, Past/current experiences, and 1-2 comorbidities: hx of L PF tear, R knee surgery  are also affecting patient's functional outcome.   REHAB POTENTIAL: Good  CLINICAL DECISION MAKING: Evolving/moderate complexity  EVALUATION COMPLEXITY: Moderate   GOALS: Goals reviewed with patient? Yes  SHORT TERM GOALS: Target date: 07/01/2023   PT to be IND with initial HEP for therapeutic progression Baseline: Goal status: MET 07/10/2023  2.  Pt to report pain to </= 7/10 max with getting out of bed in the AM for improvement in condition Baseline:  Goal status: Met 07/10/2023  3.  Pt to verbalize/ demo efficient gait pattern utilizing heel strike/ toe off with </= 5/10 max pain noted during gait. Baseline:  Goal status: MET 07/10/2023   LONG TERM GOALS: Target date: updated to 09/04/2023  Increase R calf gross strength to >/= 4+/5 to promote ankle strength/  stability with walking/ standing Baseline:  Goal status: ongoing 07/10/2023  2.  Pt to report max pain in the morning to </= 3/10 and during the day to </=1/10 pain for improvement in condition.  Baseline:  Goal status: ongoing 07/10/2023  3.  Improve FOTO (foot) score to >/= 70% to demo improvement in function Baseline:  08/06/23: 67% Foot Goal status: updated 11/20/204  4.  Pt to be able to sit/ stand and walk  and perform work related activities without limitation. Baseline:  Goal status: ongoing 07/10/2023  5.  Pt to be IND with all HEP and will is able to maintain and progress their current level of function IND. Baseline:  Goal status: ongoing 07/10/2023    6.  Increase knee total arc ROM to >/= 4 - 118 degrees for function ROM required for mobility for ADLS Baseline:  08/08/23: 9-485 Goal status: MET    7.  Increase R knee strength to >/= 4+/5 to promote stability with lifting and ADLS.  Baseline:  Goal status: INITIAL  8.  Pt be able to sit, stand and walk for >/= 1 hour with </= 2/10 max pain for functional endurance required for ADLS and work related activities  Baseline:  Goal status: INITIAL  PLAN:  PT FREQUENCY: 1-2x/week  PT DURATION: 8 weeks  PLANNED INTERVENTIONS: 97110-Therapeutic exercises, 97530- Therapeutic activity, [id number]- Neuromuscular re-education, 97535- Self Care, [id number]- Manual therapy, [id number]- Gait training, [id number]- Ultrasound, [id number]- Ionotophoresis 4mg /ml Dexamethasone, Taping, Dry Needling, Joint mobilization, Joint manipulation, Cryotherapy, and Moist heat  PLAN FOR NEXT SESSION: Review/ update HEP PRN. STW along the R gastroc/ soleus, consider DN if pt is up for it. Foot intrinsic strengthening, ankle extrinsic strengtheneing, calf stretching.  Knee ROM, STrengthening,  Jasmain Ahlberg PT, DPT, LAT, ATC  08/13/23  12:11 PM

## 2023-08-18 ENCOUNTER — Other Ambulatory Visit: Payer: Self-pay | Admitting: Internal Medicine

## 2023-08-19 ENCOUNTER — Ambulatory Visit: Payer: 59 | Admitting: Physical Therapy

## 2023-08-19 ENCOUNTER — Encounter: Payer: Self-pay | Admitting: Physical Therapy

## 2023-08-19 DIAGNOSIS — M6281 Muscle weakness (generalized): Secondary | ICD-10-CM

## 2023-08-19 DIAGNOSIS — M25571 Pain in right ankle and joints of right foot: Secondary | ICD-10-CM

## 2023-08-19 DIAGNOSIS — R2689 Other abnormalities of gait and mobility: Secondary | ICD-10-CM

## 2023-08-19 DIAGNOSIS — M25511 Pain in right shoulder: Secondary | ICD-10-CM

## 2023-08-19 NOTE — Therapy (Addendum)
OUTPATIENT PHYSICAL THERAPY TREATMENT    PHYSICAL THERAPY DISCHARGE SUMMARY  Visits from Start of Care: 14  Current functional level related to goals / functional outcomes: See goals, FOTO for knee 72%, foot 78%   Remaining deficits: See assessment   Education / Equipment: HEP, theraband, posture,   Patient agrees to discharge. Patient goals were partially met. Patient is being discharged due to being pleased with the current functional level.  [name] PT, DPT, LAT, ATC  08/19/23  2:12 PM        Patient Name: Mitchell Palmer MRN: [id number] DOB:May 04, 1966, 57 y.o., male Today's Date: 08/19/2023  END OF SESSION:  PT End of Session - 08/19/23 1147     Visit Number 14    Number of Visits 17    Date for PT Re-Evaluation 09/04/23    Authorization Type [employer]    PT Start Time 1146    PT Stop Time 1230    PT Time Calculation (min) 44 min                    Past Medical History:  Diagnosis Date   Allergy    allegic rhinitis   Diabetes mellitus    type II   Esophageal stricture    GERD (gastroesophageal reflux disease)    History of hernia repair    as a baby   Hyperlipidemia    Hyperplastic polyp of intestine 2010   Hypertension    OSA (obstructive sleep apnea) 04/18/2022   Proliferative diabetic retinopathy of left eye (HCC) 03/31/2020   This is active as a patient still has tufts of the anterior segment neovascularization, rubeosis in the left eye every region of capillary nonperfusion should be treated with PRP.   S/P ear surgery, follow-up exam    for drainage   Vitreous hemorrhage of right eye (HCC) 12/24/2019   Past Surgical History:  Procedure Laterality Date   BIOPSY  12/07/2021   Procedure: BIOPSY;  Surgeon: Meryl Dare, MD;  Location: [facility];  Service: Gastroenterology;;   CATARACT EXTRACTION     COLONOSCOPY  03/07/2016   COLONOSCOPY WITH PROPOFOL N/A 12/07/2021   Procedure: COLONOSCOPY WITH PROPOFOL;  Surgeon: Meryl Dare, MD;  Location: [facility];  Service: Gastroenterology;  Laterality: N/A;   ESOPHAGOGASTRODUODENOSCOPY  04/20/2009   stricture/GERD   ESOPHAGOGASTRODUODENOSCOPY (EGD) WITH PROPOFOL N/A 12/07/2021   Procedure: ESOPHAGOGASTRODUODENOSCOPY (EGD) WITH PROPOFOL;  Surgeon: Meryl Dare, MD;  Location: [facility];  Service: Gastroenterology;  Laterality: N/A;   EYE SURGERY     HERNIA REPAIR     age 64-midline   INNER EAR SURGERY  08/20/1986   went in behind rt ear-blockage   KNEE ARTHROSCOPY Right 2023   NECK SURGERY     [facility]   POLYPECTOMY     SAVORY DILATION N/A 12/07/2021   Procedure: SAVORY DILATION;  Surgeon: Meryl Dare, MD;  Location: [facility];  Service: Gastroenterology;  Laterality: N/A;   SHOULDER ARTHROSCOPY WITH ROTATOR CUFF REPAIR AND SUBACROMIAL DECOMPRESSION Right 09/29/2012   Procedure: SHOULDER ARTHROSCOPY WITH ROTATOR CUFF REPAIR AND SUBACROMIAL DECOMPRESSION;  Surgeon: Mable Paris, MD;  Location: [facility];  Service: Orthopedics;  Laterality: Right;  Right shoulder arthroscopy with subacromial decompression and distal clavicle excision & Debridement of Labrial Tear   SHOULDER SURGERY Left    x2   UPPER GASTROINTESTINAL ENDOSCOPY     Patient Active Problem List   Diagnosis Date Noted   Peripheral neuropathy 02/08/2023  Heel pain, bilateral 02/08/2023   Dyspnea on exertion 07/30/2022   OSA (obstructive sleep apnea) 04/18/2022   Loud snoring 12/12/2021   Family history of colon cancer    Lump on finger 11/06/2021   Pseudophakia, left eye 10/26/2021   Pseudophakia, right eye 10/26/2021   Vitreomacular adhesion of left eye 09/21/2021   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/2021   Vitreous hemorrhage, left eye (HCC) 06/07/2021   Proliferative diabetic retinopathy of left eye with macular edema associated with type 2 diabetes mellitus (HCC) 06/07/2021   Localized swelling, mass and lump, upper limb 05/31/2021   Low  vitamin D level 09/14/2020   Prostate cancer screening 09/02/2020   Current use of proton pump inhibitor 09/02/2020   Family history of factor V Leiden mutation 05/30/2020   Left ankle pain 01/29/2020   Obesity (BMI 30-39.9) 12/24/2019   Proliferative diabetic retinopathy of left eye determined by examination (HCC) 12/24/2019   Nuclear sclerotic cataract of left eye 12/24/2019   Low testosterone 11/13/2019   Fatigue 10/20/2019   Neck pain 04/28/2018   Need for hepatitis B screening test 03/26/2018   Need for hepatitis C screening test 03/26/2018   Paresthesia of both feet 11/01/2017   Hemorrhoids 10/14/2017   Class 2 obesity due to excess calories with body mass index (BMI) of 35.0 to 35.9 in adult 01/24/2016   Hypothyroidism 01/24/2016   Type 2 diabetes mellitus with ophthalmic complication (HCC) 07/28/2015   Alcohol consumption heavy 12/06/2014   Routine general medical examination at a health care facility 09/30/2013   Low back pain 06/17/2012   PLANTAR FASCIITIS, TRAUMATIC 11/09/2009   NEOPLASM UNCERTAIN BEHAVIOR OTHER SPEC SITES 10/07/2009   ESOPHAGEAL STRICTURE 06/27/2009   GERD 03/31/2009   Hyperlipidemia associated with type 2 diabetes mellitus (HCC) 01/06/2009   Essential hypertension 01/06/2009   Allergic rhinitis 01/06/2009    PCP: Mitchell Pimple, MD  REFERRING PROVIDER: Louann Palmer, DPM           Knee: Mitchell Geralds  MD  REFERRING DIAG: Plantar fasciitis, right [M72.2]         S/p R knee Arthroscopy      THERAPY DIAG:  Pain in right ankle and joints of right foot  Muscle weakness (generalized)  Other abnormalities of gait and mobility  Acute pain of right shoulder  Rationale for Evaluation and Treatment: Rehabilitation  ONSET DATE: 6 months for R plantar fasciitis         06/26/2023 R Knee arthroscopy         SUBJECTIVE:   SUBJECTIVE STATEMENT: " Calf tension and min knee pain.  "     PERTINENT HISTORY: See PMHx  PAIN: R knee  Are you  having pain? Yes: NPRS scale: 1/10 Pain location: medial aspect of the knee Pain description: sharp/ aching Aggravating factors: any time  Relieving factors:  N/A   R PF Are you having pain? Yes: NPRS scale: 0/10 Pain location: R calf  Pain description: sharpness Aggravating factors: getting out of bed in the morning Relieving factors: ankle brace, stretch    PRECAUTIONS: None  RED FLAGS: None   WEIGHT BEARING RESTRICTIONS: No  FALLS:  Has patient fallen in last 6 months? No  LIVING ENVIRONMENT: Lives with: lives with their family Lives in: House/apartment Stairs: Yes: External: 3 steps; none Has following equipment at home:  Brace  OCCUPATION: city of [place],  PLOF: Independent with basic ADLs  PATIENT GOALS: stretching, relieving pain.    OBJECTIVE:  Note: Objective measures  were completed at Evaluation unless otherwise noted.  DIAGNOSTIC FINDINGS:at MD's office.   PATIENT SURVEYS:  FOTO 65%  predicted 70% 08/06/23: 67% (11 th visit)  08/19/23: 78% (14 th visit)   11/20/224 knee 54% predicted 70% 08/06/23:  61% (6th visit)  08/19/23: 72% (8th visit)  COGNITION: Overall cognitive status: Within functional limits for tasks assessed     SENSATION: WFL  POSTURE: rounded shoulders and forward head  PALPATION: TTP along the  bil with R>L calf/ gastroc and posterior tibialis and at the R medial calcaneal tubercle.   R Knee 07/10/23 Pocket of swelling superolaterlal aspect of the lateral joint of the femur. And TTP along the anterior aspect of the knee.    R knee Edema joint line:39.5 mm  L knee edema joint line: 37 mm  LOWER EXTREMITY ROM:     Active  Right Re-eval Left Re-eval Right 07/15/2023 Right 07/22/2023 Right  07/30/23 08/08/23 Right  Hip flexion        Hip extension        Hip abduction        Hip adduction        Hip internal rotation        Hip external rotation        Knee flexion 112*  118 119  130  Knee extension 8 *  6  8 6  pulling posterior knee  3  Ankle dorsiflexion        Ankle plantarflexion        Ankle inversion        Ankle eversion         (Blank rows = not tested)    Notes: Pain and Stiffness at end range flexion,    LOWER EXTREMITY MMT:  MMT Right eval Left eval 07/10/2023 Re-eval 07/10/2023 Re-eval 08/06/23:  Right  Hip flexion   4+ 4+   Hip extension   4+ 4+   Hip abduction   4+ 4+   Hip adduction       Hip internal rotation       Hip external rotation       Knee flexion   4-* 4+   Knee extension   4-* 4+   Ankle dorsiflexion 4+ P! 5   5  Ankle plantarflexion 5 5     Ankle inversion 4+ P! 5   5 p  Ankle eversion 3+  5   4+ P   (Blank rows = not tested)       Notes: Pain with testing with weakness with R knee extension/   GAIT: Distance walked: 120 from waiting room to tx area Assistive device utilized: None Level of assistance: Complete Independence Comments: antalgic gait pattern with limited heel strike on the R compared bil   TREATMENT:  [facility] Adult PT Treatment:                                                DATE: 08/19/23 Therapeutic Exercise: Rec Bike L2 x 5 minutes  Heel raises x 20  Review of gastroc and soleus stretches Slant board  6 inch step up, over and back  6 inch lateral step up  SLS and tandem trials  with head turns and nods  Knee ext 10# bilat with eccentric Rt x 15, x 10 Knee flex 25# with bilat and eccentric right, x 15, x 10 STS 15# 10 x 2  SLR to fatigue Bridge 10 x 2  Manual STW to right gastroc and soleus    [facility] Adult PT Treatment:                                                DATE: 08/13/23 Therapeutic Exercise: Elliptical L1 x 5 min ramp L1 Slant board calf stretch knee bent/ straight 2 each hold both 30 sec Step up onto bosue with RLE weight shift to the LLE, and stepping back off with RLE x 2 Mini  lunge with lead foot on bosu near leg press for UE support PRN 1 x 10 bil Manual Therapy: MTPR along the R soleus/ gastroc Neuromuscular re-ed: Top taps on bosu softly alternating L/R x 2 x 10, progressed to circling bosu CW/CCW 2 x ea with toe taps Modalities: Ice pack R knee in supine per pt request x 8 min   [facility] Adult PT Treatment:                                                DATE: 08/08/23 Therapeutic Exercise: SLR 15 x 2  Supine GTB ankle inv, ev, Df x 20 each way  Knee ext 10# bilat with eccentric Rt x 15, x 10 Knee flex 25# with bilat and eccentric right, x 15, x 10 Bilat heel raise with right eccentric lower x 15 Slant board SLS on Foam Tandem on foam  4 inch step down with retro step up x 10 4 inch step down heel strike X 10 Right QS 5 sec x 10   SL bridge x 15 each SAQ with ball squeeze STS 15# 10 x 2  Modalities: Ice pack right knee x 10 minutes per pt request  [facility] Adult PT Treatment:                                                DATE: 08/06/23 Therapeutic Exercise: Standing TKE blue band  Ankle inv, ev, DF with green band x 15 each Heel raise with Right eccentric lower Gastroc  stretch Soleus stretch Knee ext 10# bilat with eccentric Rt Knee flex 25# with bilat and eccentric right STS 10# 10 x 2  6 inch step up x 12 6 inch lateral step down x 12 Single leg bridge x 15  SAQ with Ball Squeeze 15 x  2  SLR with ER 2# x 15 x 10  Therapeutic Activity: FOTO updated for Knee and Foot    PATIENT EDUCATION: 07/10/2023 Education details: Evaluation findings, POC, goals, HEP with proper form/ rationale. Person educated: Patient Education method: Explanation, Demonstration, Verbal cues, and Handouts Education comprehension: verbalized understanding  HOME EXERCISE PROGRAM: Access Code: [email or web address] URL: [email or web address] Date: 06/17/2023 Prepared by: [name]  Plantar Fascia Exercises  - Gastroc Stretch with Foot at Wall  - 3 x  daily - 7 x weekly - 1-2 sets - 1-2 reps - 30-60 seconds hold - Soleus Stretch with Foot at Wall  - 3 x daily - 7 x weekly - 1 -2 sets - 1 - 2 reps - 30- 60 seconds hold - Seated Ankle Eversion with Resistance  - 1 x daily - 7 x weekly - 2 sets - 10 reps - Seated Calf Stretch with Strap  - 1 x daily - 7 x weekly - 1 - 2 sets - 2 reps - 30 - 60 seconds hold - Seated Ankle Dorsiflexion with Resistance  - 1 x daily - 7 x weekly - 2 sets - 10 reps - Seated Ankle Inversion with Resistance  - 1 x daily - 7 x weekly - 2 sets - 10 reps - Seated Heel Raise  - 1 x daily - 7 x weekly - 2 sets - 15 reps - Gastroc Stretch on Wall  - 1 x daily - 7 x weekly - 1 sets - 3 reps - 20 hold - Standing Soleus Stretch  - 1 x daily - 7 x weekly - 1 sets - 3 reps - 20 hold - Seated Hip External Rotation with Resistance  - 1 x daily - 7 x weekly - 2 sets - 12 reps  Knee:  Access Code: [email or web address] URL: [email or web address] Date: 07/22/2023 Prepared by: [name]  Exercises - Supine Heel Slides  - 1 x daily - 7 x weekly - 2 sets - 10 reps - Supine Quad Set  - 1 x daily - 7 x weekly - 2 sets - 10 reps - SLR (Mirrored)  - 1 x daily - 7 x weekly - 2 sets - 10 reps - 1 hold - Supine Bridge  - 1 x daily - 7 x weekly - 2 sets - 10 reps - Modified Thomas Stretch  - 1 x daily - 7 x weekly - 2 sets - 2 reps - 30 seconds hold - Sit to Stand  - 1 x daily - 7 x weekly - 2 sets - 10 reps - Seated Hamstring Stretch  - 2 x daily - 7 x weekly - 2 sets - 2 reps - 30 hold ASSESSMENT:  CLINICAL IMPRESSION: 08/19/2023 / DISCHARGE: Eppie arrives to session reporting 1/10 medial knee pain and min calf tension. His FOTO scores have improved to target and he has met or partially met his LTGs. Most limitation reported with standing in one place more than 30 minutes, otherwise he does not feel limited with sitting or walking. He has not returned to walking at least one mile and was educated on walking program to increase  walking tolerance.  Continued remainder of session with review of HEP. Pt is pleased with current LOF and and would like to DC to HEP today.      Re-evaluation adding R Knee  Pt arrives to PT s/p R knee scope on 11/6. Since surgery he reports overall doing well, but does have pain and swelling which  most noticeably is occurring along the anterolateral aspect of the R knee. He demonstrates limited knee ROM and weakness as a result of pain and stiffness. He is TTPD and the anterior aspect of the knee. He is going to the MD tomorrow to get fluid pulled of his knee, per the pt report. He would benefit from physical therapy to reduce  R knee pain, improve ROM and strength, and maximize his function by addressing the deficits listed.    R PF  EVALUATION: Patient is a 57 y.o. M who was seen today for physical therapy evaluation and treatment for dx of R Plantar fasciitis. Pt presents with R heel pain that started insidisously 6 months ago.  He has functional ankle ROM compared bil with weakness in R ankle DF/ inversion/ eversion with pain during assessment. TTP along bil gastroc/ soleus and posterior tib with R worse than left and specific point tenderness along the medial calcaneal tubercle. Patient responded well in session with MTPR along the R calf and posterior tib, followed with stretching calf strengthening. He noted relief in the calf and in the heel end of the session. Pt would benefit from physical therapy to decrease R heel pain, reduce calf tightness, improve gross LE strength, and maximize gait efficiency and overall function by addressing the deficits listed.   OBJECTIVE IMPAIRMENTS: Abnormal gait, decreased activity tolerance, decreased endurance, decreased strength, increased fascial restrictions, increased muscle spasms, improper body mechanics, postural dysfunction, and pain.   ACTIVITY LIMITATIONS: lifting, standing, squatting, and locomotion level  PARTICIPATION LIMITATIONS: community  activity, occupation, and yard work  PERSONAL FACTORS: Age, Past/current experiences, and 1-2 comorbidities: hx of L PF tear, R knee surgery  are also affecting patient's functional outcome.   REHAB POTENTIAL: Good  CLINICAL DECISION MAKING: Evolving/moderate complexity  EVALUATION COMPLEXITY: Moderate   GOALS: Goals reviewed with patient? Yes  SHORT TERM GOALS: Target date: 07/01/2023   PT to be IND with initial HEP for therapeutic progression Baseline: Goal status: MET 07/10/2023  2.  Pt to report pain to </= 7/10 max with getting out of bed in the AM for improvement in condition Baseline:  Goal status: Met 07/10/2023  3.  Pt to verbalize/ demo efficient gait pattern utilizing heel strike/ toe off with </= 5/10 max pain noted during gait. Baseline:  Goal status: MET 07/10/2023   LONG TERM GOALS: Target date: updated to 09/04/2023  Increase R calf gross strength to >/= 4+/5 to promote ankle strength/ stability with walking/ standing Baseline:  Goal status: ongoing 07/10/2023  2.  Pt to report max pain in the morning to </= 3/10 and during the day to </=1/10 pain for improvement in condition.  Baseline:  Goal status: ongoing 07/10/2023  3.  Improve FOTO (foot) score to >/= 70% to demo improvement in function Baseline:  08/06/23: 67% Foot Goal status: MET for foot and knee  4.  Pt to be able to sit/ stand and walk  and perform work related activities without limitation. Baseline:  08/19/23: 30 minutes or more will be limited for standing.  Goal status: PARTIALLY MET   5.  Pt to be IND with all HEP and will is able to maintain and progress their current level of function IND. Baseline:  Goal status: MET    6.  Increase knee total arc ROM to >/= 4 - 118 degrees for function ROM required for mobility for ADLS Baseline:  08/08/23: 1-610 Goal status: MET    7.  Increase R knee strength to >/=  4+/5 to promote stability with lifting and ADLS.  Baseline:  Goal  status: MET  8.  Pt be able to sit, stand and walk for >/= 1 hour with </= 2/10 max pain for functional endurance required for ADLS and work related activities  Baseline:  08/19/23: limited to 30 minutes standing , no issues with sitting and walking  Goal status: PARTIALLY MET   PLAN:  PT FREQUENCY: 1-2x/week  PT DURATION: 8 weeks  PLANNED INTERVENTIONS: 97110-Therapeutic exercises, 97530- Therapeutic activity, 97112- Neuromuscular re-education, 97535- Self Care, 62952- Manual therapy, [id number]- Gait training, 97035- Ultrasound, [id number]- Ionotophoresis 4mg /ml Dexamethasone, Taping, Dry Needling, Joint mobilization, Joint manipulation, Cryotherapy, and Moist heat  PLAN FOR NEXT SESSION: N/A , DC to HEP  Jannette Spanner, PTA 08/19/23 12:34 PM Phone: [phone] Fax: [phone]

## 2023-08-20 ENCOUNTER — Telehealth: Payer: Self-pay

## 2023-08-20 NOTE — Telephone Encounter (Signed)
Looks like he is not scheduled for cpe do you just want to get labs day of ?

## 2023-08-20 NOTE — Telephone Encounter (Signed)
Copied from CRM [id number]. Topic: Clinical - Request for Lab/Test Order >> Aug 20, 2023 11:08 AM Desma Mcgregor wrote: Reason for CRM: Pt stated that he is to get lab work done before his appt on 08/28/23. Asking for a callback [phone]

## 2023-08-21 NOTE — Telephone Encounter (Signed)
 Not sure what for - his endocrinologist takes care of his diabetes

## 2023-08-22 ENCOUNTER — Encounter: Payer: Self-pay | Admitting: Family Medicine

## 2023-08-22 NOTE — Telephone Encounter (Signed)
 We will do labs that day depending on symptoms  Thanks

## 2023-08-22 NOTE — Telephone Encounter (Signed)
 His appt with PCP on 08/27/22 is for prostate issues. Pt was just asking if he needs labs before that appt

## 2023-08-22 NOTE — Telephone Encounter (Signed)
 Pt.notified

## 2023-08-28 ENCOUNTER — Ambulatory Visit: Payer: 59 | Admitting: Family Medicine

## 2023-08-28 ENCOUNTER — Encounter: Payer: Self-pay | Admitting: Family Medicine

## 2023-08-28 VITALS — BP 135/75 | HR 74 | Temp 98.1°F | Ht 68.0 in | Wt 219.0 lb

## 2023-08-28 DIAGNOSIS — Z8042 Family history of malignant neoplasm of prostate: Secondary | ICD-10-CM | POA: Diagnosis not present

## 2023-08-28 DIAGNOSIS — Z125 Encounter for screening for malignant neoplasm of prostate: Secondary | ICD-10-CM

## 2023-08-28 NOTE — Patient Instructions (Signed)
 Take care of yourself  Both psa and prostate exam are reassuring We will continue to watch you   Lab Results  Component Value Date   PSA 0.61 05/01/2023   PSA 0.66 10/27/2021   PSA 0.60 09/02/2020    Let us know if you have any urinary changes

## 2023-08-28 NOTE — Progress Notes (Signed)
 Subjective:    Patient ID: Mitchell Palmer, male    DOB: [birth year], 58 y.o.   MRN: [id number]  HPI  Wt Readings from Last 3 Encounters:  08/28/23 219 lb (99.3 kg)  05/08/23 198 lb (89.8 kg)  04/25/23 206 lb 9.6 oz (93.7 kg)   33.30 kg/m  Vitals:   08/28/23 1124 08/28/23 1144  BP: (!) 146/86 135/75  Pulse: 74   Temp: 98.1 F (36.7 C)   SpO2: 98%     Pt presents to discuss family history of prostate cancer and prostate screening    His brother was dx with stage 2 prostate cancer at 44 No other prostate cancer in family    Lab Results  Component Value Date   PSA 0.61 05/01/2023   PSA 0.66 10/27/2021   PSA 0.60 09/02/2020   No problems emptying bladder Stream has not slowed down with age   Nocturia - if he drinks a lot 1-2  If not -0-1    Patient Active Problem List   Diagnosis Date Noted   Family history of prostate cancer 08/28/2023   Peripheral neuropathy 02/08/2023   Heel pain, bilateral 02/08/2023   Dyspnea on exertion 07/30/2022   OSA (obstructive sleep apnea) 04/18/2022   Loud snoring 12/12/2021   Family history of colon cancer    Lump on finger 11/06/2021   Pseudophakia, left eye 10/26/2021   Pseudophakia, right eye 10/26/2021   Vitreomacular adhesion of left eye 09/21/2021   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/2021   Vitreous hemorrhage, left eye (HCC) 06/07/2021   Proliferative diabetic retinopathy of left eye with macular edema associated with type 2 diabetes mellitus (HCC) 06/07/2021   Localized swelling, mass and lump, upper limb 05/31/2021   Low vitamin D  level 09/14/2020   Prostate cancer screening 09/02/2020   Current use of proton pump inhibitor 09/02/2020   Family history of factor V Leiden mutation 05/30/2020   Left ankle pain 01/29/2020   Obesity (BMI 30-39.9) 12/24/2019   Proliferative diabetic retinopathy of left eye determined by examination (HCC) 12/24/2019   Nuclear sclerotic cataract of left eye 12/24/2019   Low testosterone   11/13/2019   Fatigue 10/20/2019   Neck pain 04/28/2018   Need for hepatitis B screening test 03/26/2018   Need for hepatitis C screening test 03/26/2018   Paresthesia of both feet 11/01/2017   Hemorrhoids 10/14/2017   Class 2 obesity due to excess calories with body mass index (BMI) of 35.0 to 35.9 in adult 01/24/2016   Hypothyroidism 01/24/2016   Type 2 diabetes mellitus with ophthalmic complication (HCC) 07/28/2015   Alcohol consumption heavy 12/06/2014   Routine general medical examination at a health care facility 09/30/2013   Low back pain 06/17/2012   PLANTAR FASCIITIS, TRAUMATIC 11/09/2009   NEOPLASM UNCERTAIN BEHAVIOR OTHER SPEC SITES 10/07/2009   ESOPHAGEAL STRICTURE 06/27/2009   GERD 03/31/2009   Hyperlipidemia associated with type 2 diabetes mellitus (HCC) 01/06/2009   Essential hypertension 01/06/2009   Allergic rhinitis 01/06/2009   Past Medical History:  Diagnosis Date   Allergy    allegic rhinitis   Diabetes mellitus    type II   Esophageal stricture    GERD (gastroesophageal reflux disease)    History of hernia repair    as a baby   Hyperlipidemia    Hyperplastic polyp of intestine 2010   Hypertension    OSA (obstructive sleep apnea) 04/18/2022   Proliferative diabetic retinopathy of left eye (HCC) 03/31/2020   This is active as a patient  still has tufts of the anterior segment neovascularization, rubeosis in the left eye every region of capillary nonperfusion should be treated with PRP.   S/P ear surgery, follow-up exam    for drainage   Vitreous hemorrhage of right eye (HCC) 12/24/2019   Past Surgical History:  Procedure Laterality Date   BIOPSY  12/07/2021   Procedure: BIOPSY;  Surgeon: Aneita Gwendlyn DASEN, MD;  Location: [facility];  Service: Gastroenterology;;   CATARACT EXTRACTION     COLONOSCOPY  03/07/2016   COLONOSCOPY WITH PROPOFOL  N/A 12/07/2021   Procedure: COLONOSCOPY WITH PROPOFOL ;  Surgeon: Aneita Gwendlyn DASEN, MD;  Location: [facility];   Service: Gastroenterology;  Laterality: N/A;   ESOPHAGOGASTRODUODENOSCOPY  04/20/2009   stricture/GERD   ESOPHAGOGASTRODUODENOSCOPY (EGD) WITH PROPOFOL  N/A 12/07/2021   Procedure: ESOPHAGOGASTRODUODENOSCOPY (EGD) WITH PROPOFOL ;  Surgeon: Aneita Gwendlyn DASEN, MD;  Location: [facility];  Service: Gastroenterology;  Laterality: N/A;   EYE SURGERY     HERNIA REPAIR     age 76-midline   INNER EAR SURGERY  08/20/1986   went in behind rt ear-blockage   KNEE ARTHROSCOPY Right 2023   NECK SURGERY     [facility]   POLYPECTOMY     SAVORY DILATION N/A 12/07/2021   Procedure: SAVORY DILATION;  Surgeon: Aneita Gwendlyn DASEN, MD;  Location: [facility];  Service: Gastroenterology;  Laterality: N/A;   SHOULDER ARTHROSCOPY WITH ROTATOR CUFF REPAIR AND SUBACROMIAL DECOMPRESSION Right 09/29/2012   Procedure: SHOULDER ARTHROSCOPY WITH ROTATOR CUFF REPAIR AND SUBACROMIAL DECOMPRESSION;  Surgeon: Eva Elsie Herring, MD;  Location: [facility];  Service: Orthopedics;  Laterality: Right;  Right shoulder arthroscopy with subacromial decompression and distal clavicle excision & Debridement of Labrial Tear   SHOULDER SURGERY Left    x2   UPPER GASTROINTESTINAL ENDOSCOPY     Social History   Tobacco Use   Smoking status: Never   Smokeless tobacco: Never  Vaping Use   Vaping status: Never Used  Substance Use Topics   Alcohol use: Yes    Alcohol/week: 12.0 standard drinks of alcohol    Types: 12 Cans of beer per week   Drug use: No   Family History  Problem Relation Age of Onset   Diabetes Mother    Colon cancer Father 67   Diabetes Other    Colon polyps Brother    Diabetes Maternal Aunt    Cancer Cousin        breast cancer   Allergies  Allergen Reactions   Codeine     Anxious   Lipitor [Atorvastatin ]     Muscle pain    Current Outpatient Medications on File Prior to Visit  Medication Sig Dispense Refill   B-D UF III MINI PEN NEEDLES 31G X 5 MM MISC USE AS DIRECTED 4 TIMES  A DAY 100 each 3   cetirizine (ZYRTEC) 10 MG tablet Take 10 mg by mouth daily.     Cholecalciferol (VITAMIN D ) 50 MCG (2000 UT) tablet Take 2,000 Units by mouth daily.     Continuous Glucose Sensor (FREESTYLE LIBRE 3 SENSOR) MISC APPLY 1 SENSOR EVERY 14 DAYS 6 each 3   cyclobenzaprine  (FLEXERIL ) 10 MG tablet Take 1 tablet (10 mg total) by mouth 3 (three) times daily as needed. 30 tablet 3   fluticasone  (FLONASE ) 50 MCG/ACT nasal spray PLACE 2 SPRAYS INTO BOTH NOSTRILS DAILY AS NEEDED FOR ALLERGIES OR RHINITIS. 16 mL 2   gabapentin  (NEURONTIN ) 100 MG capsule Take 2 capsules (200 mg total) by mouth 2 (two)  times daily. 360 capsule 0   insulin  glargine (LANTUS  SOLOSTAR) 100 UNIT/ML Solostar Pen INJECT 42 UNITS INTO THE SKIN EVERY MORNING AND 50 UNITS INTO THE SKIN EVERY EVENING 90 mL 1   insulin  lispro (HUMALOG ) 100 UNIT/ML KwikPen INJECT 25-30 UNITS UNDER THE SKIN THREE TIMES A DAY WITH MEALS AND 10 TO 12 UNITS UNDER THE SKIN WITH SNACKS 60 mL 3   JARDIANCE  25 MG TABS tablet TAKE 1 TABLET BY MOUTH EVERY DAY BEFORE BREAKFAST 90 tablet 3   levothyroxine  (SYNTHROID ) 75 MCG tablet TAKE 1 TABLET BY MOUTH DAILY BEFORE BREAKFAST 90 tablet 1   olmesartan  (BENICAR ) 5 MG tablet Take 1 tablet (5 mg total) by mouth daily. 90 tablet 3   omeprazole  (PRILOSEC) 40 MG capsule TAKE 1 CAPSULE BY MOUTH EVERY DAY 30 capsule 10   ONETOUCH ULTRA test strip USE TO TEST BLOOD SUGAR 4 TIMES DAILY AS DIRECTED 400 strip 11   rosuvastatin  (CRESTOR ) 10 MG tablet TAKE 1 TABLET BY MOUTH EVERY DAY 30 tablet 3   tadalafil  (CIALIS ) 20 MG tablet Take 1 tablet (20 mg total) by mouth daily as needed for erectile dysfunction. 4 tablet 5   tirzepatide  (MOUNJARO ) 15 MG/0.5ML Pen Inject 15 mg into the skin once a week. 6 mL 2   vitamin B-12 (CYANOCOBALAMIN ) 500 MCG tablet Take 500 mcg by mouth daily.     No current facility-administered medications on file prior to visit.    Review of Systems  Constitutional:  Negative for activity  change, appetite change, fatigue, fever and unexpected weight change.  HENT:  Negative for congestion, rhinorrhea, sore throat and trouble swallowing.   Eyes:  Negative for pain, redness, itching and visual disturbance.  Respiratory:  Negative for cough, chest tightness, shortness of breath and wheezing.   Cardiovascular:  Negative for chest pain and palpitations.  Gastrointestinal:  Negative for abdominal pain, blood in stool, constipation, diarrhea and nausea.  Endocrine: Negative for cold intolerance, heat intolerance, polydipsia and polyuria.  Genitourinary:  Negative for decreased urine volume, difficulty urinating, dysuria, frequency and urgency.  Musculoskeletal:  Negative for arthralgias, joint swelling and myalgias.       Recent knee surgery   Skin:  Negative for pallor and rash.  Neurological:  Negative for dizziness, tremors, weakness, numbness and headaches.  Hematological:  Negative for adenopathy. Does not bruise/bleed easily.  Psychiatric/Behavioral:  Negative for decreased concentration and dysphoric mood. The patient is not nervous/anxious.        Objective:   Physical Exam Exam conducted with a chaperone present.  Constitutional:      General: He is not in acute distress.    Appearance: Normal appearance. He is obese. He is not ill-appearing.  Eyes:     General: No scleral icterus.    Conjunctiva/sclera: Conjunctivae normal.     Pupils: Pupils are equal, round, and reactive to light.  Cardiovascular:     Rate and Rhythm: Normal rate and regular rhythm.  Abdominal:     General: Bowel sounds are normal. There is no distension.     Palpations: Abdomen is soft. There is no mass.     Tenderness: There is no abdominal tenderness.  Genitourinary:    Prostate: Normal. Not enlarged, not tender and no nodules present.     Rectum: No mass, tenderness or external hemorrhoid.     Comments: Prostate is normal size, smooth ,symmetric and firm Non tender    Musculoskeletal:      Cervical back: Neck supple.  Skin:  Findings: No rash.  Neurological:     Mental Status: He is alert.  Psychiatric:        Mood and Affect: Mood normal.           Assessment & Plan:   Problem List Items Addressed This Visit       Other   Prostate cancer screening - Primary   Brother has stage 2 prostate cancer at age 37 Pt is concerned and wants to discuss screening  Lab Results  Component Value Date   PSA 0.61 05/01/2023   PSA 0.66 10/27/2021   PSA 0.60 09/02/2020    Will continue psa annually  Normal exam today  No urinary changes or symptoms   Discussed plan for screening  Can get est with urology in future if desired  Discussed expectation for prostate growth over time   The natural history of prostate cancer and ongoing controversy regarding screening and potential treatment outcomes of prostate cancer has been discussed with the patient. The meaning of a false positive PSA and a false negative PSA has been discussed. He would like to continue checking psa blood tests annually         Family history of prostate cancer   Brother at age 37  Stage 2    Discussed screening  Lab Results  Component Value Date   PSA 0.61 05/01/2023   PSA 0.66 10/27/2021   PSA 0.60 09/02/2020    Normal prostate exam today  Will continue to monitor

## 2023-08-28 NOTE — Assessment & Plan Note (Addendum)
 Brother has stage 2 prostate cancer at age 57 Pt is concerned and wants to discuss screening  Lab Results  Component Value Date   PSA 0.61 05/01/2023   PSA 0.66 10/27/2021   PSA 0.60 09/02/2020    Will continue psa annually  Normal exam today  No urinary changes or symptoms   Discussed plan for screening  Can get est with urology in future if desired  Discussed expectation for prostate growth over time   The natural history of prostate cancer and ongoing controversy regarding screening and potential treatment outcomes of prostate cancer has been discussed with the patient. The meaning of a false positive PSA and a false negative PSA has been discussed. He would like to continue checking psa blood tests annually

## 2023-08-28 NOTE — Assessment & Plan Note (Addendum)
 Brother at age 57  Stage 2    Discussed screening  Lab Results  Component Value Date   PSA 0.61 05/01/2023   PSA 0.66 10/27/2021   PSA 0.60 09/02/2020    Normal prostate exam today  Will continue to monitor

## 2023-08-29 ENCOUNTER — Encounter: Payer: Self-pay | Admitting: Internal Medicine

## 2023-08-29 ENCOUNTER — Ambulatory Visit (INDEPENDENT_AMBULATORY_CARE_PROVIDER_SITE_OTHER): Payer: 59 | Admitting: Internal Medicine

## 2023-08-29 VITALS — BP 120/70 | HR 88 | Ht 68.0 in | Wt 219.4 lb

## 2023-08-29 DIAGNOSIS — E039 Hypothyroidism, unspecified: Secondary | ICD-10-CM

## 2023-08-29 DIAGNOSIS — E7800 Pure hypercholesterolemia, unspecified: Secondary | ICD-10-CM

## 2023-08-29 DIAGNOSIS — Z794 Long term (current) use of insulin: Secondary | ICD-10-CM

## 2023-08-29 DIAGNOSIS — E11319 Type 2 diabetes mellitus with unspecified diabetic retinopathy without macular edema: Secondary | ICD-10-CM | POA: Diagnosis not present

## 2023-08-29 LAB — POCT GLYCOSYLATED HEMOGLOBIN (HGB A1C): Hemoglobin A1C: 6.6 % — AB (ref 4.0–5.6)

## 2023-08-29 NOTE — Progress Notes (Signed)
 Continue subjective:     Patient ID: Mitchell Palmer, male   DOB: Jan 17, 1966, 58 y.o.   MRN: [id number]   HPI Mitchell Palmer is a 58 y.o. man, returning for followup for DM2, dx in 2000, uncontrolled, insulin -dependent, with complications (CKD stage 3, erectile dysfunction, mild DR). Last visit 4 months ago.  Interim history: He continues to have steroid injections for generalized joint pains.  He had knee surgery since last visit.  Last steroid inj 07/2023. Sugars are high after the injections. No increased urination, nausea, chest pain.  He has blurry vision and increased pressure in the right eye (gets IO injections).  Reviewed HbA1c levels: 04/25/2023: HbA1c 6.9% Lab Results  Component Value Date   HGBA1C 7.3 (A) 11/27/2022   HGBA1C 6.8 (A) 07/25/2022   HGBA1C 7.3 (A) 03/21/2022   HGBA1C 8.3 (A) 11/06/2021   HGBA1C 8.1 (A) 02/22/2021   HGBA1C 8.0 (A) 10/17/2020   HGBA1C 7.8 (A) 05/11/2020   HGBA1C 7.5 (A) 01/07/2020   HGBA1C 7.5 (A) 09/08/2019   HGBA1C 7.1 (A) 04/16/2019   HGBA1C 7.6 (A) 09/03/2018   HGBA1C 8.2 (A) 05/21/2018   HGBA1C 7.4 (A) 01/27/2018   HGBA1C 7.6 10/21/2017   HGBA1C 8.0 05/10/2017   HGBA1C 7.4 11/15/2016   HGBA1C 7.6 08/15/2016   HGBA1C 7.7 05/15/2016   HGBA1C 7.8 02/15/2016   HGBA1C 7.7 11/08/2015   He is on: - Jardiance  25 mg daily - Mounjaro  10 >> 12.5 >> 15 units weekly - Lantus  40-46 units 2x a day >> 30-40 units 2x a day (use the lower dose before starting the shift)  - Humalog   (15 min before each meal) 25-30 units for breakfast but 20-25 units before the rest of the meals Uses 10-15 units for correction.  He was on: - he stopped  Invokana  08/2016 as he had leg tingling - Cycloset  - started 04/22/2013 >> at 1.6 mg daily (tried 3 tabs >> dizziness >> backed off to 2) - Victoza  1.8 mg daily - Actos 45 mg daily - Januvia - glyburide/metformin  - Avandia. - Bydureon  2 mg weekly - he hated the thick needle  - Trulicity  - Basaglar  - Metformin  -  stopped 2023 2/2 fatigue  He is checking his CBG more than 4 times a day with his newly acquired freestyle libre 3 CGM:  Previously:  Previously:    Lowest: 59 (took too much Humalog  for pizza) >> 60s >> 58 >> 57;  He has hypoglycemia awareness in the 80s. Highest 400 (steroids) >> .SABRASABRA 300s >> 350s >> 300s.  He works nights: 6 PM to 7:30 AM 3-4 days a week.  Meals: - 5:30-6 pm (at work): subway sandwich >> 2 hot dogs + doritos >> chicken biscuit - snack at 9-10 pm: pistachios or 1/2 sandwich; pizza  >> 12 am: nabs +peanuts, apples - 11 am-12 pm: salad, meat + veggies + rice (leftovers from take out), pizza  + HL. Last lipid panel: Lab Results  Component Value Date   CHOL 129 05/01/2023   HDL 50.50 05/01/2023   LDLCALC 53 05/01/2023   LDLDIRECT 63.0 10/27/2021   TRIG 126.0 05/01/2023   CHOLHDL 3 05/01/2023  On Lipitor 40.  -+ Stage II CKD, last BUN/Cr:  Lab Results  Component Value Date   BUN 17 05/01/2023   CREATININE 1.18 05/01/2023   Lab Results  Component Value Date   MICRALBCREAT 1.2 04/25/2023   MICRALBCREAT 0.9 02/15/2016   MICRALBCREAT 1.0 12/07/2014   MICRALBCREAT 0.5 11/04/2012  On Benicar  40 >>  20 >> 5 mg daily.  - last eye exam was 07/29/2023: + DR.  Dr. Octavia >> now Dr. [name].  He had laser surgery [facility].  Also, In 07/2018 >> had EMG surgery in his R eye for bleeding. He had cataract sx (Dr. Octavia), another surgery (Dr. [name]) on R eye in 2021.  Getting intraocular injections.  - no numbness or tingling in his legs.  Foot exam - 04/08/2023 (Dr. Joya).  He also has a history of HTN, GERD, esophageal stricture, plantar fasciitis, eustachian tube dysfunction - status post ear surgery.  Hypothyroidism: -Started levothyroxine  01/2016 -He was skipping doses in the past and TFTs were fluctuating.  Pt is on levothyroxine  75 mcg daily, taken: - in am - fasting - at least 30 min from b'fast - no calcium  - no iron - no multivitamins - no PPIs - not on  Biotin  Reviewed his TFTs: Lab Results  Component Value Date   TSH 2.30 05/01/2023   TSH 2.66 02/11/2023   TSH 4.19 10/27/2021   TSH 2.47 09/02/2020   TSH 1.85 10/20/2019   TSH 2.95 10/21/2018   TSH 5.18 (H) 09/18/2018   TSH 4.25 10/31/2017   TSH 1.17 09/21/2016   TSH 3.05 04/24/2016   In 07/2018, he had C spine surgery.  Review of Systems + see HPI  I reviewed pt's medications, allergies, PMH, social hx, family hx, and changes were documented in the history of present illness. Otherwise, unchanged from my initial visit note.  Past Medical History:  Diagnosis Date   Allergy    allegic rhinitis   Diabetes mellitus    type II   Esophageal stricture    GERD (gastroesophageal reflux disease)    History of hernia repair    as a baby   Hyperlipidemia    Hyperplastic polyp of intestine 2010   Hypertension    OSA (obstructive sleep apnea) 04/18/2022   Proliferative diabetic retinopathy of left eye (HCC) 03/31/2020   This is active as a patient still has tufts of the anterior segment neovascularization, rubeosis in the left eye every region of capillary nonperfusion should be treated with PRP.   S/P ear surgery, follow-up exam    for drainage   Vitreous hemorrhage of right eye (HCC) 12/24/2019   Past Surgical History:  Procedure Laterality Date   BIOPSY  12/07/2021   Procedure: BIOPSY;  Surgeon: Aneita Gwendlyn DASEN, MD;  Location: [facility];  Service: Gastroenterology;;   CATARACT EXTRACTION     COLONOSCOPY  03/07/2016   COLONOSCOPY WITH PROPOFOL  N/A 12/07/2021   Procedure: COLONOSCOPY WITH PROPOFOL ;  Surgeon: Aneita Gwendlyn DASEN, MD;  Location: [facility];  Service: Gastroenterology;  Laterality: N/A;   ESOPHAGOGASTRODUODENOSCOPY  04/20/2009   stricture/GERD   ESOPHAGOGASTRODUODENOSCOPY (EGD) WITH PROPOFOL  N/A 12/07/2021   Procedure: ESOPHAGOGASTRODUODENOSCOPY (EGD) WITH PROPOFOL ;  Surgeon: Aneita Gwendlyn DASEN, MD;  Location: [facility];  Service: Gastroenterology;   Laterality: N/A;   EYE SURGERY     HERNIA REPAIR     age 35-midline   INNER EAR SURGERY  08/20/1986   went in behind rt ear-blockage   KNEE ARTHROSCOPY Right 2023   NECK SURGERY     [facility]   POLYPECTOMY     SAVORY DILATION N/A 12/07/2021   Procedure: SAVORY DILATION;  Surgeon: Aneita Gwendlyn DASEN, MD;  Location: [facility];  Service: Gastroenterology;  Laterality: N/A;   SHOULDER ARTHROSCOPY WITH ROTATOR CUFF REPAIR AND SUBACROMIAL DECOMPRESSION Right 09/29/2012   Procedure: SHOULDER ARTHROSCOPY WITH ROTATOR CUFF REPAIR AND  SUBACROMIAL DECOMPRESSION;  Surgeon: Eva Elsie Herring, MD;  Location: [facility];  Service: Orthopedics;  Laterality: Right;  Right shoulder arthroscopy with subacromial decompression and distal clavicle excision & Debridement of Labrial Tear   SHOULDER SURGERY Left    x2   UPPER GASTROINTESTINAL ENDOSCOPY     Social History   Socioeconomic History   Marital status: Single    Spouse name: Not on file   Number of children: Not on file   Years of education: Not on file   Highest education level: Not on file  Occupational History   Occupation: Designer, Television/film Set  Tobacco Use   Smoking status: Never   Smokeless tobacco: Never  Vaping Use   Vaping status: Never Used  Substance and Sexual Activity   Alcohol use: Yes    Alcohol/week: 12.0 standard drinks of alcohol    Types: 12 Cans of beer per week   Drug use: No   Sexual activity: Not on file  Other Topics Concern   Not on file  Social History Narrative   Not on file   Social Drivers of Health   Financial Resource Strain: Not on file  Food Insecurity: Not on file  Transportation Needs: Not on file  Physical Activity: Not on file  Stress: Not on file  Social Connections: Not on file  Intimate Partner Violence: Not on file   Current Outpatient Medications on File Prior to Visit  Medication Sig Dispense Refill   B-D UF III MINI PEN NEEDLES 31G X 5 MM MISC USE AS DIRECTED 4 TIMES A  DAY 100 each 3   cetirizine (ZYRTEC) 10 MG tablet Take 10 mg by mouth daily.     Cholecalciferol (VITAMIN D ) 50 MCG (2000 UT) tablet Take 2,000 Units by mouth daily.     Continuous Glucose Sensor (FREESTYLE LIBRE 3 SENSOR) MISC APPLY 1 SENSOR EVERY 14 DAYS 6 each 3   cyclobenzaprine  (FLEXERIL ) 10 MG tablet Take 1 tablet (10 mg total) by mouth 3 (three) times daily as needed. 30 tablet 3   fluticasone  (FLONASE ) 50 MCG/ACT nasal spray PLACE 2 SPRAYS INTO BOTH NOSTRILS DAILY AS NEEDED FOR ALLERGIES OR RHINITIS. 16 mL 2   gabapentin  (NEURONTIN ) 100 MG capsule Take 2 capsules (200 mg total) by mouth 2 (two) times daily. 360 capsule 0   insulin  glargine (LANTUS  SOLOSTAR) 100 UNIT/ML Solostar Pen INJECT 42 UNITS INTO THE SKIN EVERY MORNING AND 50 UNITS INTO THE SKIN EVERY EVENING 90 mL 1   insulin  lispro (HUMALOG ) 100 UNIT/ML KwikPen INJECT 25-30 UNITS UNDER THE SKIN THREE TIMES A DAY WITH MEALS AND 10 TO 12 UNITS UNDER THE SKIN WITH SNACKS 60 mL 3   JARDIANCE  25 MG TABS tablet TAKE 1 TABLET BY MOUTH EVERY DAY BEFORE BREAKFAST 90 tablet 3   levothyroxine  (SYNTHROID ) 75 MCG tablet TAKE 1 TABLET BY MOUTH DAILY BEFORE BREAKFAST 90 tablet 1   olmesartan  (BENICAR ) 5 MG tablet Take 1 tablet (5 mg total) by mouth daily. 90 tablet 3   omeprazole  (PRILOSEC) 40 MG capsule TAKE 1 CAPSULE BY MOUTH EVERY DAY 30 capsule 10   ONETOUCH ULTRA test strip USE TO TEST BLOOD SUGAR 4 TIMES DAILY AS DIRECTED 400 strip 11   rosuvastatin  (CRESTOR ) 10 MG tablet TAKE 1 TABLET BY MOUTH EVERY DAY 30 tablet 3   tadalafil  (CIALIS ) 20 MG tablet Take 1 tablet (20 mg total) by mouth daily as needed for erectile dysfunction. 4 tablet 5   tirzepatide  (MOUNJARO ) 15 MG/0.5ML Pen Inject 15  mg into the skin once a week. 6 mL 2   vitamin B-12 (CYANOCOBALAMIN ) 500 MCG tablet Take 500 mcg by mouth daily.     No current facility-administered medications on file prior to visit.   Allergies  Allergen Reactions   Codeine     Anxious   Lipitor  [Atorvastatin ]     Muscle pain    Family History  Problem Relation Age of Onset   Diabetes Mother    Colon cancer Father 86   Diabetes Other    Colon polyps Brother    Diabetes Maternal Aunt    Cancer Cousin        breast cancer     Objective:   Physical Exam There were no vitals taken for this visit.    Wt Readings from Last 3 Encounters:  08/28/23 219 lb (99.3 kg)  05/08/23 198 lb (89.8 kg)  04/25/23 206 lb 9.6 oz (93.7 kg)   Constitutional: overweight, in NAD Eyes: no exophthalmos ENT: no masses palpated in neck, no cervical lymphadenopathy Cardiovascular: RRR, No MRG Respiratory: CTA B Musculoskeletal: no deformities Skin: no rashes Neurological: no tremor with outstretched hands  Assessment:     1. 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 HL  3. Hypothyroidism    Plan:     1.  Patient with longstanding, uncontrolled, type 2 diabetes, on a complex antidiabetic regimen including SGLT2 inhibitor, weekly GLP-1/GIP receptor agonist and basal-bolus insulin , with the insulin  doses adjusted at last visit.  He is working nights and is grazing and also gets steroid injections, which are hindering our efforts in controlling his diabetes. -At last visit, HbA1c was better, at 6.9%.  Sugars were slightly improved, fluctuating mainly within the target range but with hyperglycemic spikes after meals.  In the 4 days prior to our last visit sugars were mostly at goal.  He mentions that he felt that he was dropping his blood sugars taking the full dose recommended so we discussed about taking lower doses in the recommended interval.  We did not change the rest of the regimen.  I also recommended to improve his diet. CGM interpretation: -At today's visit, we reviewed his CGM downloads from a month ago: It appears that 43% of values are in target  range (goal >70%), while 57% are higher than 180 (goal <25%), and 0% are lower than 70 (goal <4%).  The calculated average blood sugar is 193.  The projected HbA1c for the next 3 months (GMI) is 7.9%.   -Reviewing the CGM trends, sugars appear to be higher after every meal, particularly after the last meal of his day.  He does mention that he did improve his blood sugars recently after starting to improve his diet.  At today's visit, HbA1c reflects this improvement.  This is the lowest HbA1c that he has had in  years!  Therefore, for now, we discussed about continuing the same regimen. -At today's visit he was not logged on into his libre 3 app.  We dilated him and have to change his password at the clinic back to the app.  However, because he has not been logged on for months, we were not able to download the latest blood sugar levels.  He did confirm that the blood sugar trends are similar as 1 month ago. - I advised him to: Patient Instructions  Please continue: - Jardiance   25 mg before breakfast - Mounjaro  15 mg weekly - Humalog  (15 min before each meal) 25-30 units for breakfast but 20-25 units before the rest of the meals - Lantus  30-40 units 2x a day (use the lower dose before your shift)  Please continue levothyroxine  75 mcg daily.  Take the thyroid  hormone every day, with water, at least 30 minutes before breakfast, separated by at least 4 hours from: - acid reflux medications - calcium  - iron - multivitamins  Please return in 3-4 months.  - we checked his HbA1c: 6.6% (lower) - advised to check sugars at different times of the day - 4x a day, rotating check times - advised for yearly eye exams >> he is UTD - return to clinic in 3-4 months  2. HL  -Latest lipid panel was reviewed from 04/2023: All fractions at goal: Lab Results  Component Value Date   CHOL 129 05/01/2023   HDL 50.50 05/01/2023   LDLCALC 53 05/01/2023   LDLDIRECT 63.0 10/27/2021   TRIG 126.0 05/01/2023    CHOLHDL 3 05/01/2023  -He continues on Lipitor 40 mg daily without side effects  3. Hypothyroidism - latest thyroid  labs reviewed with pt. >> normal: Lab Results  Component Value Date   TSH 2.30 05/01/2023  - he continues on LT4 75 mcg daily - pt feels good on this dose. - we discussed about taking the thyroid  hormone every day, with water, >30 minutes before breakfast, separated by >4 hours from acid reflux medications, calcium , iron, multivitamins. Pt. is taking it correctly.  Lela Fendt, MD PhD [facility]

## 2023-08-29 NOTE — Patient Instructions (Signed)
 Please continue: - Jardiance  25 mg before breakfast - Mounjaro  15 mg weekly - Humalog  (15 min before each meal) 25-30 units for breakfast but 20-25 units before the rest of the meals - Lantus  30-40 units 2x a day (use the lower dose before your shift)  Please continue levothyroxine  75 mcg daily.  Take the thyroid  hormone every day, with water, at least 30 minutes before breakfast, separated by at least 4 hours from: - acid reflux medications - calcium  - iron - multivitamins  Please return in 3-4 months.

## 2023-09-17 ENCOUNTER — Encounter: Payer: Self-pay | Admitting: Internal Medicine

## 2023-09-17 ENCOUNTER — Encounter: Payer: Self-pay | Admitting: Family Medicine

## 2023-09-18 ENCOUNTER — Other Ambulatory Visit: Payer: Self-pay | Admitting: Internal Medicine

## 2023-09-18 MED ORDER — BASAGLAR KWIKPEN 100 UNIT/ML ~~LOC~~ SOPN: PEN_INJECTOR | SUBCUTANEOUS | 1 refills | Status: DC

## 2023-09-24 ENCOUNTER — Other Ambulatory Visit: Payer: Self-pay | Admitting: Internal Medicine

## 2023-09-24 MED ORDER — TOUJEO SOLOSTAR 300 UNIT/ML ~~LOC~~ SOPN: PEN_INJECTOR | SUBCUTANEOUS | 2 refills | Status: DC

## 2023-09-24 NOTE — Addendum Note (Signed)
Addended by: Pollie Meyer on: 09/24/2023 04:14 PM   Modules accepted: Orders

## 2023-09-25 ENCOUNTER — Other Ambulatory Visit: Payer: Self-pay | Admitting: Internal Medicine

## 2023-09-25 ENCOUNTER — Telehealth: Payer: Self-pay

## 2023-09-25 MED ORDER — BASAGLAR KWIKPEN 100 UNIT/ML ~~LOC~~ SOPN: PEN_INJECTOR | SUBCUTANEOUS | 2 refills | Status: DC

## 2023-09-25 NOTE — Addendum Note (Signed)
 Addended by: Vernon Goodpasture on: 09/25/2023 09:42 AM   Modules accepted: Orders

## 2023-09-25 NOTE — Telephone Encounter (Signed)
Pt needs PA for Lantus

## 2023-09-26 ENCOUNTER — Other Ambulatory Visit (HOSPITAL_COMMUNITY): Payer: Self-pay

## 2023-09-26 ENCOUNTER — Telehealth: Payer: Self-pay

## 2023-09-26 NOTE — Telephone Encounter (Signed)
 Pharmacy Patient Advocate Encounter   Received notification from Pt Calls Messages that prior authorization for Lantus  is required/requested.   Insurance verification completed.   The patient is insured through [facility] .   Per test claim: Refill too soon. PA is not needed at this time. Medication was filled 09/18/23. Next eligible fill date is 09/29/23.

## 2023-09-30 ENCOUNTER — Ambulatory Visit: Payer: 59 | Admitting: Diagnostic Neuroimaging

## 2023-10-01 ENCOUNTER — Encounter: Payer: Self-pay | Admitting: Family Medicine

## 2023-10-01 DIAGNOSIS — G6289 Other specified polyneuropathies: Secondary | ICD-10-CM

## 2023-10-02 ENCOUNTER — Other Ambulatory Visit: Payer: Self-pay | Admitting: Family Medicine

## 2023-10-02 ENCOUNTER — Other Ambulatory Visit: Payer: Self-pay | Admitting: Internal Medicine

## 2023-10-02 NOTE — Telephone Encounter (Signed)
The order is in  Please schedule lab  Please take a mvi daily if not already  Try to cut back alcohol intake if you can

## 2023-10-03 NOTE — Telephone Encounter (Signed)
Left VM requesting pt to call the office back to schedule lab appt and also sent a mychart message

## 2023-10-04 ENCOUNTER — Other Ambulatory Visit (INDEPENDENT_AMBULATORY_CARE_PROVIDER_SITE_OTHER): Payer: 59

## 2023-10-04 DIAGNOSIS — G6289 Other specified polyneuropathies: Secondary | ICD-10-CM | POA: Diagnosis not present

## 2023-10-06 ENCOUNTER — Other Ambulatory Visit: Payer: Self-pay | Admitting: Internal Medicine

## 2023-10-06 DIAGNOSIS — E11319 Type 2 diabetes mellitus with unspecified diabetic retinopathy without macular edema: Secondary | ICD-10-CM

## 2023-10-08 ENCOUNTER — Encounter: Payer: Self-pay | Admitting: Internal Medicine

## 2023-10-08 MED ORDER — TOUJEO SOLOSTAR 300 UNIT/ML ~~LOC~~ SOPN: PEN_INJECTOR | SUBCUTANEOUS | 2 refills | Status: AC

## 2023-10-09 ENCOUNTER — Encounter: Payer: Self-pay | Admitting: Family Medicine

## 2023-10-09 LAB — VITAMIN B6: Vitamin B6: 8.8 ng/mL (ref 2.1–21.7)

## 2023-10-10 ENCOUNTER — Encounter: Payer: Self-pay | Admitting: Family Medicine

## 2023-10-11 ENCOUNTER — Encounter: Payer: Self-pay | Admitting: Family Medicine

## 2023-10-17 ENCOUNTER — Other Ambulatory Visit: Payer: Self-pay | Admitting: Family Medicine

## 2023-10-21 ENCOUNTER — Ambulatory Visit: Payer: 59 | Admitting: Podiatry

## 2023-10-21 ENCOUNTER — Encounter: Payer: Self-pay | Admitting: Podiatry

## 2023-10-21 ENCOUNTER — Other Ambulatory Visit: Payer: Self-pay | Admitting: Family Medicine

## 2023-10-21 DIAGNOSIS — M722 Plantar fascial fibromatosis: Secondary | ICD-10-CM | POA: Diagnosis not present

## 2023-10-21 DIAGNOSIS — G6289 Other specified polyneuropathies: Secondary | ICD-10-CM

## 2023-10-21 MED ORDER — DEXAMETHASONE SODIUM PHOSPHATE 120 MG/30ML IJ SOLN
4.0000 mg | Freq: Once | INTRAMUSCULAR | Status: AC
  Administered 2023-10-21: 4 mg via INTRA_ARTICULAR

## 2023-10-21 MED ORDER — TRIAMCINOLONE ACETONIDE 10 MG/ML IJ SUSP
2.5000 mg | Freq: Once | INTRAMUSCULAR | Status: AC
  Administered 2023-10-21: 2.5 mg via INTRA_ARTICULAR

## 2023-10-21 NOTE — Progress Notes (Signed)
  Subjective:  Patient ID: Mitchell Palmer, male    DOB: 08/24/1965,   MRN: [id number]  No chief complaint on file.   58 y.o. male presents for follow-up of plantar fasciitis on the right and numbness in the feet. Relates injection did help last time and was about 90% better. He is awaiting his neurology appointment and has been discharged from PT.    Denies any other pedal complaints. Denies n/v/f/c.   Past Medical History:  Diagnosis Date   Allergy    allegic rhinitis   Diabetes mellitus    type II   Esophageal stricture    GERD (gastroesophageal reflux disease)    History of hernia repair    as a baby   Hyperlipidemia    Hyperplastic polyp of intestine 2010   Hypertension    OSA (obstructive sleep apnea) 04/18/2022   Proliferative diabetic retinopathy of left eye (HCC) 03/31/2020   This is active as a patient still has tufts of the anterior segment neovascularization, rubeosis in the left eye every region of capillary nonperfusion should be treated with PRP.   S/P ear surgery, follow-up exam    for drainage   Vitreous hemorrhage of right eye (HCC) 12/24/2019    Objective:  Physical Exam: Vascular: DP/PT pulses 2/4 bilateral. CFT <3 seconds. Normal hair growth on digits. No edema.  Skin. No lacerations or abrasions bilateral feet.  Musculoskeletal: MMT 5/5 bilateral lower extremities in DF, PF, Inversion and Eversion. Deceased ROM of the ankle. Tender to medial calcaneal tubercle mild.  Neurological: Sensation intact to light touch. Relates burning and stinlign pains.   Assessment:   1. Plantar fasciitis, right   2. Other polyneuropathy          Plan:  Patient was evaluated and treated and all questions answered. Discussed plantar fasciitis with patient. Discussed neruopathy and radiculopathy as well.  Reviewed notes from PCP.  Continue gabapentin.  X-rays reviewed and discussed with patient. No acute fractures or dislocations noted. Mild spurring noted at inferior  calcaneus.  Discussed treatment options including, ice, NSAIDS, supportive shoes, bracing, and stretching. Continue stretching and brace.  . Injection offered today. Procedure below.  -Discussed neuropathy and radicular pain. Referral to neurology placed awaiting possible NCV/EMG study.  Follow-up as needed.   Procedure: Injection Tendon/Ligament Discussed alternatives, risks, complications and verbal consent was obtained.  Location: Right plantar fascia . Skin Prep: Alcohol. Injectate: 1cc 0.5% marcaine plain, 1 cc dexamethasone 0.5 cc kenalog  Disposition: Patient tolerated procedure well. Injection site dressed with a band-aid.  Post-injection care was discussed and return precautions discussed.          Louann Sjogren, DPM

## 2023-10-29 ENCOUNTER — Ambulatory Visit: Payer: 59 | Admitting: Neurology

## 2023-10-29 ENCOUNTER — Encounter: Payer: Self-pay | Admitting: Neurology

## 2023-10-29 VITALS — BP 130/89 | HR 88 | Ht 69.0 in | Wt 216.6 lb

## 2023-10-29 DIAGNOSIS — M48061 Spinal stenosis, lumbar region without neurogenic claudication: Secondary | ICD-10-CM

## 2023-10-29 DIAGNOSIS — E1149 Type 2 diabetes mellitus with other diabetic neurological complication: Secondary | ICD-10-CM

## 2023-10-29 DIAGNOSIS — Z794 Long term (current) use of insulin: Secondary | ICD-10-CM | POA: Diagnosis not present

## 2023-10-29 DIAGNOSIS — E1140 Type 2 diabetes mellitus with diabetic neuropathy, unspecified: Secondary | ICD-10-CM | POA: Insufficient documentation

## 2023-10-29 MED ORDER — DULOXETINE HCL 60 MG PO CPEP
60.0000 mg | ORAL_CAPSULE | Freq: Every day | ORAL | 11 refills | Status: DC
Start: 2023-10-29 — End: 2024-04-23

## 2023-10-29 NOTE — Progress Notes (Signed)
 Chief Complaint  Patient presents with   Room 15    Pt is here Alone. Pt states that he started noticing stinging sensation as well as numbness a couple of years ago. Pt states that 1 year ago his pain had gotten worse. Pt is on Gabapentin and he states that it helps a lot. Pt states that we would like to know wether it is a pinched nerve in his back causing his discomfort or if it's diabetic neuropathy.       ASSESSMENT AND PLAN  Mitchell Palmer is a 58 y.o. male   Lumbar stenosis Long history of diabetes  His complains of bilateral foot paresthesia likely combination of diabetic peripheral neuropathy, lumbar radiculopathy, and plantar fasciitis  Laboratory evaluation to rule out other treatable etiology  Is under the care of orthopedic surgeon Dr. Yevette Edwards for his lumbar stenosis  Add on Cymbalta 60 mg daily for symptomatic control, Neurontin as needed  Only return to clinic for new issues,  DIAGNOSTIC DATA (LABS, IMAGING, TESTING) - I reviewed patient records, labs, notes, testing and imaging myself where available.   MEDICAL HISTORY:  Mitchell Palmer, is a 58 year old male seen in request by podiatrist Dr. Louann Sjogren, for evaluation of lower extremity paresthesia, his primary care physician is [facility], Audrie Gallus, initial evaluation was on October 29, 2023  History is obtained from the patient and review of electronic medical records. I personally reviewed pertinent available imaging films in PACS.   PMHx of  DM since 2000, insulin dependent x15 years. HLD HTN Hypothyrodism Drink Alcohol regularly. OSA-CPAP,  DM retinopathy Cervical decompression surgery C5-6, presented with neck pain, left cervical radiculopathy  He had long history of diabetes, with diabetic retinopathy, over the past couple years he developed gradual onset bilateral feet numbness sensation, as if both foot falling to sleep, sometimes numb tingling burning pain, given gabapentin 100 mg 2 tablets twice a  day, which has been helpful  He had a history of left foot tendon injury 30 years ago, with flatter left foot, chronic low back pain, under the care of orthopedic surgeon Dr. Yevette Edwards, personally reviewed MRI of lumbar spine October 2019, congenitally short pedicles with superimposed disc, facet degeneration resulting in severe spinal stenosis L2-3, L3-4, is getting conservative treatment, still have intermittent flareup of low back pain,  He recently treated for right foot bone spur, plantar fasciitis, received a few shots to his right foot, has helped his symptoms, but complains of right more than left foot paresthesia, muscle cramping  He denies gait abnormality, denies bowel and bladder incontinence,   In September 2024, A1c 6.9, normal B12 vitamin D, TSH, CBC hemoglobin of 17.1, CMP  PHYSICAL EXAM:   Vitals:   10/29/23 1125  BP: 130/89  Pulse: 88  Weight: 216 lb 9.6 oz (98.2 kg)  Height: 5\' 9"  (1.753 m)   Not recorded     Body mass index is 31.99 kg/m.  PHYSICAL EXAMNIATION:  Gen: NAD, conversant, well nourised, well groomed                     Cardiovascular: Regular rate rhythm, no peripheral edema, warm, nontender. Eyes: Conjunctivae clear without exudates or hemorrhage Neck: Supple, no carotid bruits. Pulmonary: Clear to auscultation bilaterally   NEUROLOGICAL EXAM:  MENTAL STATUS: Speech/cognition: Awake, alert, oriented to history taking and casual conversation CRANIAL NERVES: CN II: Visual fields are full to confrontation. Pupils are round equal and briskly reactive to light. CN III, IV, VI: extraocular  movement are normal. No ptosis. CN V: Facial sensation is intact to light touch CN VII: Face is symmetric with normal eye closure  CN VIII: Hearing is normal to causal conversation. CN IX, X: Phonation is normal. CN XI: Head turning and shoulder shrug are intact  MOTOR: Left hammer toe, no significant weakness,  REFLEXES: Reflexes are 1 and symmetric at  the biceps, triceps, absent at knees, and ankles. Plantar responses are flexor.  SENSORY: Hair light receding to midshin level, length-dependent sensory change to light touch, pinprick and vibratory sensation to midshin,  COORDINATION: There is no trunk or limb dysmetria noted.  GAIT/STANCE: push up to get up from seated position, steady, flat left foot, able to stand up on tiptoe and heels.  REVIEW OF SYSTEMS:  Full 14 system review of systems performed and notable only for as above All other review of systems were negative.   ALLERGIES: Allergies  Allergen Reactions   Codeine     Anxious   Lipitor [Atorvastatin]     Muscle pain     HOME MEDICATIONS: Current Outpatient Medications  Medication Sig Dispense Refill   B-D UF III MINI PEN NEEDLES 31G X 5 MM MISC USE AS DIRECTED 4 TIMES A DAY 100 each 3   cetirizine (ZYRTEC) 10 MG tablet Take 10 mg by mouth daily.     Cholecalciferol (VITAMIN D) 50 MCG (2000 UT) tablet Take 2,000 Units by mouth daily.     Continuous Glucose Sensor (FREESTYLE LIBRE 3 SENSOR) MISC APPLY 1 SENSOR EVERY 14 DAYS 6 each 3   cyclobenzaprine (FLEXERIL) 10 MG tablet Take 1 tablet (10 mg total) by mouth 3 (three) times daily as needed. 30 tablet 3   fluticasone (FLONASE) 50 MCG/ACT nasal spray PLACE 2 SPRAYS INTO BOTH NOSTRILS DAILY AS NEEDED FOR ALLERGIES OR RHINITIS. 16 mL 2   gabapentin (NEURONTIN) 100 MG capsule Take 2 capsules (200 mg total) by mouth 2 (two) times daily. 360 capsule 0   insulin glargine, 1 Unit Dial, (TOUJEO SOLOSTAR) 300 UNIT/ML Solostar Pen INJECT 42 UNITS INTO THE SKIN EVERY MORNING AND 50 UNITS INTO THE SKIN EVERY EVENING 30 mL 2   insulin lispro (HUMALOG) 100 UNIT/ML KwikPen INJECT 25-30 UNITS UNDER THE SKIN THREE TIMES A DAY WITH MEALS AND 10 TO 12 UNITS UNDER THE SKIN WITH SNACKS 60 mL 3   JARDIANCE 25 MG TABS tablet TAKE 1 TABLET BY MOUTH EVERY DAY BEFORE BREAKFAST 90 tablet 3   levothyroxine (SYNTHROID) 75 MCG tablet TAKE 1 TABLET  BY MOUTH DAILY BEFORE BREAKFAST 90 tablet 1   olmesartan (BENICAR) 5 MG tablet Take 1 tablet (5 mg total) by mouth daily. 90 tablet 3   omeprazole (PRILOSEC) 40 MG capsule TAKE 1 CAPSULE BY MOUTH EVERY DAY 30 capsule 10   ONETOUCH ULTRA test strip USE TO TEST BLOOD SUGAR 4 TIMES DAILY AS DIRECTED 400 strip 11   rosuvastatin (CRESTOR) 10 MG tablet TAKE 1 TABLET BY MOUTH EVERY DAY 90 tablet 1   tadalafil (CIALIS) 20 MG tablet TAKE 1 TABLET BY MOUTH EVERY DAY AS NEEDED FOR ERECTILE DYSFUNCTION 4 tablet 5   tirzepatide (MOUNJARO) 15 MG/0.5ML Pen INJECT 15 MG UNDER THE SKIN ONCE WEEKLY 6 mL 1   vitamin B-12 (CYANOCOBALAMIN) 500 MCG tablet Take 500 mcg by mouth daily.     insulin glargine (LANTUS SOLOSTAR) 100 UNIT/ML Solostar Pen INJECT 42 UNITS INTO THE SKIN EVERY MORNING AND 50 UNITS INTO THE SKIN EVERY EVENING 60 mL 3   No  current facility-administered medications for this visit.    PAST MEDICAL HISTORY: Past Medical History:  Diagnosis Date   Allergy    allegic rhinitis   Diabetes mellitus    type II   Esophageal stricture    GERD (gastroesophageal reflux disease)    History of hernia repair    as a baby   Hyperlipidemia    Hyperplastic polyp of intestine 2010   Hypertension    OSA (obstructive sleep apnea) 04/18/2022   Proliferative diabetic retinopathy of left eye (HCC) 03/31/2020   This is active as a patient still has tufts of the anterior segment neovascularization, rubeosis in the left eye every region of capillary nonperfusion should be treated with PRP.   S/P ear surgery, follow-up exam    for drainage   Vitreous hemorrhage of right eye (HCC) 12/24/2019    PAST SURGICAL HISTORY: Past Surgical History:  Procedure Laterality Date   BIOPSY  12/07/2021   Procedure: BIOPSY;  Surgeon: Meryl Dare, MD;  Location: [facility];  Service: Gastroenterology;;   CATARACT EXTRACTION     COLONOSCOPY  03/07/2016   COLONOSCOPY WITH PROPOFOL N/A 12/07/2021   Procedure: COLONOSCOPY  WITH PROPOFOL;  Surgeon: Meryl Dare, MD;  Location: [facility];  Service: Gastroenterology;  Laterality: N/A;   ESOPHAGOGASTRODUODENOSCOPY  04/20/2009   stricture/GERD   ESOPHAGOGASTRODUODENOSCOPY (EGD) WITH PROPOFOL N/A 12/07/2021   Procedure: ESOPHAGOGASTRODUODENOSCOPY (EGD) WITH PROPOFOL;  Surgeon: Meryl Dare, MD;  Location: [facility];  Service: Gastroenterology;  Laterality: N/A;   EYE SURGERY     HERNIA REPAIR     age 82-midline   INNER EAR SURGERY  08/20/1986   went in behind rt ear-blockage   KNEE ARTHROSCOPY Right 2023   NECK SURGERY     [facility]   POLYPECTOMY     SAVORY DILATION N/A 12/07/2021   Procedure: SAVORY DILATION;  Surgeon: Meryl Dare, MD;  Location: [facility];  Service: Gastroenterology;  Laterality: N/A;   SHOULDER ARTHROSCOPY WITH ROTATOR CUFF REPAIR AND SUBACROMIAL DECOMPRESSION Right 09/29/2012   Procedure: SHOULDER ARTHROSCOPY WITH ROTATOR CUFF REPAIR AND SUBACROMIAL DECOMPRESSION;  Surgeon: Mable Paris, MD;  Location: [facility];  Service: Orthopedics;  Laterality: Right;  Right shoulder arthroscopy with subacromial decompression and distal clavicle excision & Debridement of Labrial Tear   SHOULDER SURGERY Left    x2   UPPER GASTROINTESTINAL ENDOSCOPY      FAMILY HISTORY: Family History  Problem Relation Age of Onset   Diabetes Mother    Colon cancer Father 45   Diabetes Other    Colon polyps Brother    Diabetes Maternal Aunt    Cancer Cousin        breast cancer    SOCIAL HISTORY: Social History   Socioeconomic History   Marital status: Single    Spouse name: Not on file   Number of children: Not on file   Years of education: Not on file   Highest education level: Not on file  Occupational History   Occupation: Designer, television/film set  Tobacco Use   Smoking status: Never   Smokeless tobacco: Never  Vaping Use   Vaping status: Never Used  Substance and Sexual Activity   Alcohol use: Yes     Alcohol/week: 12.0 standard drinks of alcohol    Types: 12 Cans of beer per week   Drug use: No   Sexual activity: Not on file  Other Topics Concern   Not on file  Social History Narrative   Not  on file   Social Drivers of Health   Financial Resource Strain: Not on file  Food Insecurity: Not on file  Transportation Needs: Not on file  Physical Activity: Not on file  Stress: Not on file  Social Connections: Not on file  Intimate Partner Violence: Not on file      Levert Feinstein, M.D. Ph.D.  [facility] [address] [place], [place] [address] Ph: [phone] Fax: [phone]  CC:  Louann Sjogren, DPM [address] [place],  [place] [address]  Judy Pimple, MD

## 2023-10-31 LAB — SEDIMENTATION RATE: Sed Rate: 9 mm/h (ref 0–30)

## 2023-10-31 LAB — MULTIPLE MYELOMA PANEL, SERUM
Albumin SerPl Elph-Mcnc: 3.9 g/dL (ref 2.9–4.4)
Albumin/Glob SerPl: 1.4 (ref 0.7–1.7)
Alpha 1: 0.3 g/dL (ref 0.0–0.4)
Alpha2 Glob SerPl Elph-Mcnc: 1 g/dL (ref 0.4–1.0)
B-Globulin SerPl Elph-Mcnc: 1 g/dL (ref 0.7–1.3)
Gamma Glob SerPl Elph-Mcnc: 0.8 g/dL (ref 0.4–1.8)
Globulin, Total: 3 g/dL (ref 2.2–3.9)
IgA/Immunoglobulin A, Serum: 135 mg/dL (ref 90–386)
IgG (Immunoglobin G), Serum: 725 mg/dL (ref 603–1613)
IgM (Immunoglobulin M), Srm: 120 mg/dL (ref 20–172)
Total Protein: 6.9 g/dL (ref 6.0–8.5)

## 2023-10-31 LAB — ANA W/REFLEX IF POSITIVE: Anti Nuclear Antibody (ANA): NEGATIVE

## 2023-10-31 LAB — C-REACTIVE PROTEIN: CRP: 14 mg/L — ABNORMAL HIGH (ref 0–10)

## 2023-10-31 LAB — HGB A1C W/O EAG: Hgb A1c MFr Bld: 6.9 % — ABNORMAL HIGH (ref 4.8–5.6)

## 2023-11-01 ENCOUNTER — Ambulatory Visit: Payer: 59 | Admitting: Neurology

## 2023-11-03 ENCOUNTER — Other Ambulatory Visit: Payer: Self-pay | Admitting: Internal Medicine

## 2023-11-07 ENCOUNTER — Encounter: Payer: Self-pay | Admitting: Neurology

## 2023-11-07 LAB — HM DIABETES EYE EXAM

## 2023-11-08 ENCOUNTER — Encounter: Payer: Self-pay | Admitting: Family Medicine

## 2023-12-17 ENCOUNTER — Encounter: Payer: Self-pay | Admitting: Family Medicine

## 2023-12-17 ENCOUNTER — Ambulatory Visit: Admitting: Family Medicine

## 2023-12-17 VITALS — BP 132/84 | HR 77 | Temp 98.6°F | Ht 69.0 in | Wt 214.2 lb

## 2023-12-17 DIAGNOSIS — H6691 Otitis media, unspecified, right ear: Secondary | ICD-10-CM | POA: Insufficient documentation

## 2023-12-17 DIAGNOSIS — J029 Acute pharyngitis, unspecified: Secondary | ICD-10-CM | POA: Diagnosis not present

## 2023-12-17 DIAGNOSIS — H6501 Acute serous otitis media, right ear: Secondary | ICD-10-CM

## 2023-12-17 LAB — POCT RAPID STREP A (OFFICE): Rapid Strep A Screen: POSITIVE — AB

## 2023-12-17 MED ORDER — AMOXICILLIN 875 MG PO TABS: 875.0000 mg | ORAL_TABLET | Freq: Two times a day (BID) | ORAL | 0 refills | Status: AC

## 2023-12-17 NOTE — Progress Notes (Signed)
 Subjective:    Patient ID: Mitchell Palmer, male    DOB: November 02, 1965, 58 y.o.   MRN: [id number]  HPI  Wt Readings from Last 3 Encounters:  12/17/23 214 lb 4 oz (97.2 kg)  10/29/23 216 lb 9.6 oz (98.2 kg)  08/29/23 219 lb 6.4 oz (99.5 kg)   31.64 kg/m  Vitals:   12/17/23 1150  BP: 132/84  Pulse: 77  Temp: 98.6 F (37 C)  SpO2: 97%   Pt presents for ear and throat discomfort   Yesterday woke up with right ear pain  Then stuffy nose/blowing nose  Back of throat- white stuff/ ? Drainage  Raw feeling   Felt feverish last night   Little bit of cough-phlegm yellow   No wheeze or shortness of breath   Past history of ear surgery times 2 in right ear   Results for orders placed or performed in visit on 12/17/23  Rapid Strep A   Collection Time: 12/17/23 12:09 PM  Result Value Ref Range   Rapid Strep A Screen Positive (A) Negative   Lab Results  Component Value Date   WBC 6.8 05/01/2023   HGB 17.1 (H) 05/01/2023   HCT 50.3 05/01/2023   MCV 99.4 05/01/2023   PLT 236.0 05/01/2023      Patient Active Problem List   Diagnosis Date Noted   Otitis media, right 12/17/2023   Sore throat 12/17/2023   Spinal stenosis of lumbar region 10/29/2023   Diabetic neuropathy (HCC) 10/29/2023   Family history of prostate cancer 08/28/2023   Peripheral neuropathy 02/08/2023   Heel pain, bilateral 02/08/2023   Dyspnea on exertion 07/30/2022   OSA (obstructive sleep apnea) 04/18/2022   Loud snoring 12/12/2021   Family history of colon cancer    Lump on finger 11/06/2021   Pseudophakia, left eye 10/26/2021   Pseudophakia, right eye 10/26/2021   Vitreomacular adhesion of left eye 09/21/2021   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/2021   Vitreous hemorrhage, left eye (HCC) 06/07/2021   Proliferative diabetic retinopathy of left eye with macular edema associated with type 2 diabetes mellitus (HCC) 06/07/2021   Localized swelling, mass and lump, upper limb 05/31/2021   Low vitamin D   level 09/14/2020   Prostate cancer screening 09/02/2020   Current use of proton pump inhibitor 09/02/2020   Family history of factor V Leiden mutation 05/30/2020   Left ankle pain 01/29/2020   Obesity (BMI 30-39.9) 12/24/2019   Proliferative diabetic retinopathy of left eye determined by examination (HCC) 12/24/2019   Nuclear sclerotic cataract of left eye 12/24/2019   Low testosterone  11/13/2019   Fatigue 10/20/2019   Neck pain 04/28/2018   Need for hepatitis B screening test 03/26/2018   Need for hepatitis C screening test 03/26/2018   Paresthesia of both feet 11/01/2017   Hemorrhoids 10/14/2017   Class 2 obesity due to excess calories with body mass index (BMI) of 35.0 to 35.9 in adult 01/24/2016   Hypothyroidism 01/24/2016   Type 2 diabetes mellitus with ophthalmic complication (HCC) 07/28/2015   Alcohol consumption heavy 12/06/2014   Routine general medical examination at a health care facility 09/30/2013   Low back pain 06/17/2012   PLANTAR FASCIITIS, TRAUMATIC 11/09/2009   NEOPLASM UNCERTAIN BEHAVIOR OTHER SPEC SITES 10/07/2009   ESOPHAGEAL STRICTURE 06/27/2009   GERD 03/31/2009   Hyperlipidemia associated with type 2 diabetes mellitus (HCC) 01/06/2009   Essential hypertension 01/06/2009   Allergic rhinitis 01/06/2009   Past Medical History:  Diagnosis Date   Allergy  allegic rhinitis   Diabetes mellitus    type II   Esophageal stricture    GERD (gastroesophageal reflux disease)    History of hernia repair    as a baby   Hyperlipidemia    Hyperplastic polyp of intestine 2010   Hypertension    OSA (obstructive sleep apnea) 04/18/2022   Proliferative diabetic retinopathy of left eye (HCC) 03/31/2020   This is active as a patient still has tufts of the anterior segment neovascularization, rubeosis in the left eye every region of capillary nonperfusion should be treated with PRP.   S/P ear surgery, follow-up exam    for drainage   Vitreous hemorrhage of right eye  (HCC) 12/24/2019   Past Surgical History:  Procedure Laterality Date   BIOPSY  12/07/2021   Procedure: BIOPSY;  Surgeon: Asencion Blacksmith, MD;  Location: [facility];  Service: Gastroenterology;;   CATARACT EXTRACTION     COLONOSCOPY  03/07/2016   COLONOSCOPY WITH PROPOFOL  N/A 12/07/2021   Procedure: COLONOSCOPY WITH PROPOFOL ;  Surgeon: Asencion Blacksmith, MD;  Location: [facility];  Service: Gastroenterology;  Laterality: N/A;   ESOPHAGOGASTRODUODENOSCOPY  04/20/2009   stricture/GERD   ESOPHAGOGASTRODUODENOSCOPY (EGD) WITH PROPOFOL  N/A 12/07/2021   Procedure: ESOPHAGOGASTRODUODENOSCOPY (EGD) WITH PROPOFOL ;  Surgeon: Asencion Blacksmith, MD;  Location: [facility];  Service: Gastroenterology;  Laterality: N/A;   EYE SURGERY     HERNIA REPAIR     age 34-midline   INNER EAR SURGERY  08/20/1986   went in behind rt ear-blockage   KNEE ARTHROSCOPY Right 2023   NECK SURGERY     [facility]   POLYPECTOMY     SAVORY DILATION N/A 12/07/2021   Procedure: SAVORY DILATION;  Surgeon: Asencion Blacksmith, MD;  Location: [facility];  Service: Gastroenterology;  Laterality: N/A;   SHOULDER ARTHROSCOPY WITH ROTATOR CUFF REPAIR AND SUBACROMIAL DECOMPRESSION Right 09/29/2012   Procedure: SHOULDER ARTHROSCOPY WITH ROTATOR CUFF REPAIR AND SUBACROMIAL DECOMPRESSION;  Surgeon: Derald Flattery, MD;  Location: [facility];  Service: Orthopedics;  Laterality: Right;  Right shoulder arthroscopy with subacromial decompression and distal clavicle excision & Debridement of Labrial Tear   SHOULDER SURGERY Left    x2   UPPER GASTROINTESTINAL ENDOSCOPY     Social History   Tobacco Use   Smoking status: Never   Smokeless tobacco: Never  Vaping Use   Vaping status: Never Used  Substance Use Topics   Alcohol use: Yes    Alcohol/week: 12.0 standard drinks of alcohol    Types: 12 Cans of beer per week   Drug use: No   Family History  Problem Relation Age of Onset   Diabetes Mother     Colon cancer Father 49   Diabetes Other    Colon polyps Brother    Diabetes Maternal Aunt    Cancer Cousin        breast cancer   Allergies  Allergen Reactions   Codeine     Anxious   Lipitor [Atorvastatin ]     Muscle pain    Current Outpatient Medications on File Prior to Visit  Medication Sig Dispense Refill   B-D UF III MINI PEN NEEDLES 31G X 5 MM MISC USE AS DIRECTED 4 TIMES A DAY 100 each 3   cetirizine (ZYRTEC) 10 MG tablet Take 10 mg by mouth daily.     Cholecalciferol (VITAMIN D ) 50 MCG (2000 UT) tablet Take 2,000 Units by mouth daily.     Continuous Glucose Sensor (FREESTYLE LIBRE 3 SENSOR)  MISC APPLY 1 SENSOR EVERY 14 DAYS 6 each 3   cyclobenzaprine  (FLEXERIL ) 10 MG tablet Take 1 tablet (10 mg total) by mouth 3 (three) times daily as needed. 30 tablet 3   DULoxetine  (CYMBALTA ) 60 MG capsule Take 1 capsule (60 mg total) by mouth daily. 30 capsule 11   fluticasone  (FLONASE ) 50 MCG/ACT nasal spray PLACE 2 SPRAYS INTO BOTH NOSTRILS DAILY AS NEEDED FOR ALLERGIES OR RHINITIS. 16 mL 2   gabapentin  (NEURONTIN ) 100 MG capsule Take 2 capsules (200 mg total) by mouth 2 (two) times daily. 360 capsule 0   insulin  glargine, 1 Unit Dial , (TOUJEO  SOLOSTAR) 300 UNIT/ML Solostar Pen INJECT 42 UNITS INTO THE SKIN EVERY MORNING AND 50 UNITS INTO THE SKIN EVERY EVENING 30 mL 2   insulin  lispro (HUMALOG ) 100 UNIT/ML KwikPen INJECT 25-30 UNITS UNDER THE SKIN THREE TIMES A DAY WITH MEALS AND 10 TO 12 UNITS UNDER THE SKIN WITH SNACKS 60 mL 3   JARDIANCE  25 MG TABS tablet TAKE 1 TABLET BY MOUTH EVERY DAY BEFORE BREAKFAST 90 tablet 3   levothyroxine  (SYNTHROID ) 75 MCG tablet TAKE 1 TABLET BY MOUTH DAILY BEFORE BREAKFAST 30 tablet 2   olmesartan  (BENICAR ) 5 MG tablet Take 1 tablet (5 mg total) by mouth daily. 90 tablet 3   omeprazole  (PRILOSEC) 40 MG capsule TAKE 1 CAPSULE BY MOUTH EVERY DAY 30 capsule 10   ONETOUCH ULTRA test strip USE TO TEST BLOOD SUGAR 4 TIMES DAILY AS DIRECTED 400 strip 11    rosuvastatin  (CRESTOR ) 10 MG tablet TAKE 1 TABLET BY MOUTH EVERY DAY 90 tablet 1   tadalafil  (CIALIS ) 20 MG tablet TAKE 1 TABLET BY MOUTH EVERY DAY AS NEEDED FOR ERECTILE DYSFUNCTION 4 tablet 5   tirzepatide  (MOUNJARO ) 15 MG/0.5ML Pen INJECT 15 MG UNDER THE SKIN ONCE WEEKLY 6 mL 1   vitamin B-12 (CYANOCOBALAMIN ) 500 MCG tablet Take 500 mcg by mouth daily.     No current facility-administered medications on file prior to visit.    Review of Systems  Constitutional:  Positive for appetite change and fatigue. Negative for fever.  HENT:  Positive for congestion, ear pain, postnasal drip, rhinorrhea, sinus pressure, sneezing and sore throat. Negative for ear discharge and facial swelling.   Eyes:  Negative for pain and discharge.  Respiratory:  Positive for cough. Negative for shortness of breath, wheezing and stridor.   Cardiovascular:  Negative for chest pain.  Gastrointestinal:  Negative for diarrhea, nausea and vomiting.  Genitourinary:  Negative for frequency, hematuria and urgency.  Musculoskeletal:  Negative for arthralgias and myalgias.  Skin:  Negative for rash.  Neurological:  Positive for headaches. Negative for dizziness, weakness and light-headedness.  Psychiatric/Behavioral:  Negative for confusion and dysphoric mood.        Objective:   Physical Exam Constitutional:      General: He is not in acute distress.    Appearance: Normal appearance. He is well-developed. He is obese. He is not ill-appearing, toxic-appearing or diaphoretic.  HENT:     Head: Normocephalic and atraumatic.     Comments: Nares are injected and congested      Right Ear: Ear canal and external ear normal.     Left Ear: Tympanic membrane, ear canal and external ear normal.     Ears:     Comments: Right TM is erythematous with effusion and very slightly retracted     Nose: Congestion and rhinorrhea present.     Mouth/Throat:     Mouth: Mucous membranes are  moist.     Pharynx: Oropharynx is clear.  Posterior oropharyngeal erythema present. No oropharyngeal exudate.     Comments: Clear pnd   Mild posterior injection of throat  Eyes:     General:        Right eye: No discharge.        Left eye: No discharge.     Conjunctiva/sclera: Conjunctivae normal.     Pupils: Pupils are equal, round, and reactive to light.  Cardiovascular:     Rate and Rhythm: Normal rate.     Heart sounds: Normal heart sounds.  Pulmonary:     Effort: Pulmonary effort is normal. No respiratory distress.     Breath sounds: Normal breath sounds. No stridor. No wheezing, rhonchi or rales.  Chest:     Chest wall: No tenderness.  Musculoskeletal:     Cervical back: Normal range of motion and neck supple.  Lymphadenopathy:     Cervical: No cervical adenopathy.  Skin:    General: Skin is warm and dry.     Capillary Refill: Capillary refill takes less than 2 seconds.     Findings: No erythema or rash.  Neurological:     Mental Status: He is alert.     Cranial Nerves: No cranial nerve deficit.  Psychiatric:        Mood and Affect: Mood normal.           Assessment & Plan:   Problem List Items Addressed This Visit       Nervous and Auditory   Otitis media, right - Primary   In ear with past surgery  Right ear pain /some sore throat (rapid strep test also positive) Some congestion  Disc symptomatic care - see instructions on AVS Anoxicillin 875 mg bid for 7d  Isolate first 24 hours  Return to work when improved Update if not starting to improve in a week or if worsening  Call back and Er precautions noted in detail today        Relevant Medications   amoxicillin  (AMOXIL ) 875 MG tablet     Other   Sore throat   Rapid strep test mildly positive Reassuring exam   Will treat with amoxicillin       Relevant Orders   Rapid Strep A (Completed)

## 2023-12-17 NOTE — Assessment & Plan Note (Signed)
 Rapid strep test mildly positive Reassuring exam   Will treat with amoxicillin 

## 2023-12-17 NOTE — Assessment & Plan Note (Signed)
 In ear with past surgery  Right ear pain /some sore throat (rapid strep test also positive) Some congestion  Disc symptomatic care - see instructions on AVS Anoxicillin 875 mg bid for 7d  Isolate first 24 hours  Return to work when improved Update if not starting to improve in a week or if worsening  Call back and Er precautions noted in detail today

## 2023-12-17 NOTE — Patient Instructions (Signed)
 Drink fluids and rest  mucinex DM is good for cough and congestion if needed Take amoxicillin  for ear infection and strep  Nasal saline for congestion as needed  Tylenol  for fever or pain or headache  Please alert us  if symptoms worsen (if severe or short of breath please go to the ER)   Update if not starting to improve in a week or if worsening

## 2023-12-25 ENCOUNTER — Telehealth: Payer: Self-pay

## 2023-12-25 ENCOUNTER — Other Ambulatory Visit (INDEPENDENT_AMBULATORY_CARE_PROVIDER_SITE_OTHER)

## 2023-12-25 ENCOUNTER — Ambulatory Visit: Admitting: Orthopaedic Surgery

## 2023-12-25 VITALS — Ht 69.0 in | Wt 214.0 lb

## 2023-12-25 DIAGNOSIS — G8929 Other chronic pain: Secondary | ICD-10-CM

## 2023-12-25 DIAGNOSIS — M1711 Unilateral primary osteoarthritis, right knee: Secondary | ICD-10-CM

## 2023-12-25 DIAGNOSIS — M25561 Pain in right knee: Secondary | ICD-10-CM

## 2023-12-25 NOTE — Telephone Encounter (Signed)
 Right knee gel as soon as we can??

## 2023-12-25 NOTE — Telephone Encounter (Signed)
 VOB submitted for Durolane, right knee.

## 2023-12-25 NOTE — Progress Notes (Signed)
 The patient is a very pleasant and active 58 year old gentleman who comes in for evaluation treatment of known severe patellofemoral arthritis of his right knee.  He has had 2 arthroscopic interventions on the right knee by one of my colleagues in town.  He was able to show me the arthroscopy pictures and it does look like he has end-stage arthritis of the patellofemoral joint especially undersurface of the patella and he has had microfracture surgery as well.  He last had a steroid injection in his right knee 3 months ago.  He is interested at this point and hyaluronic acid injections and I agree with this as well.  He is not obese.  He is not a diabetic.  Both knees were assessed today and he actually has neutral alignment of both knees.  His right knee has significant patellofemoral grinding and crepitation but good range of motion and is ligamentously stable.  X-rays of the right knee shows severe patellofemoral arthritis.  The medial lateral compartments are well-maintained.  At this point he is definitely a candidate for hyaluronic acid for his right knee to treat the pain from osteoarthritis that he is experiencing.  Other conservative treatment efforts have failed.  Will work on hopefully getting this ordered and approved soon as possible.  This patient is diagnosed with osteoarthritis of the knee(s).    Radiographs show evidence of joint space narrowing, osteophytes, subchondral sclerosis and/or subchondral cysts.  This patient has knee pain which interferes with functional and activities of daily living.    This patient has experienced inadequate response, adverse effects and/or intolerance with conservative treatments such as acetaminophen , NSAIDS, topical creams, physical therapy or regular exercise, knee bracing and/or weight loss.   This patient has experienced inadequate response or has a contraindication to intra articular steroid injections for at least 3 months.   This patient is  not scheduled to have a total knee replacement within 6 months of starting treatment with viscosupplementation.

## 2023-12-27 ENCOUNTER — Ambulatory Visit: Payer: 59 | Admitting: Internal Medicine

## 2023-12-27 ENCOUNTER — Ambulatory Visit: Admitting: Family Medicine

## 2023-12-27 ENCOUNTER — Encounter: Payer: Self-pay | Admitting: Family Medicine

## 2023-12-27 VITALS — BP 124/86 | HR 75 | Temp 98.4°F | Ht 69.0 in | Wt 213.4 lb

## 2023-12-27 DIAGNOSIS — H9201 Otalgia, right ear: Secondary | ICD-10-CM

## 2023-12-27 MED ORDER — NEOMYCIN-POLYMYXIN-HC 3.5-10000-1 OT SOLN: 3.0000 [drp] | Freq: Three times a day (TID) | OTIC | 0 refills | Status: DC

## 2023-12-27 NOTE — Assessment & Plan Note (Signed)
 S/p OM- overall improved after course of amoxicillin  but still feels full/muffled and sometimes itchy  Has had surgery on this ear in the past  On exam- TM appears normal , some cerumen but not impacted   Possible etd (with env allergies)  Instructed to increase flonase  to bid for a week  Also sent cortisporin otic suspension for canal irritation   Update if not starting to improve in a week or if worsening  Call back and Er precautions noted in detail today

## 2023-12-27 NOTE — Progress Notes (Signed)
 Subjective:    Patient ID: Mitchell Palmer, male    DOB: 03/26/1966, 58 y.o.   MRN: [id number]  HPI  Wt Readings from Last 3 Encounters:  12/27/23 213 lb 6 oz (96.8 kg)  12/25/23 214 lb (97.1 kg)  12/17/23 214 lb 4 oz (97.2 kg)   31.51 kg/m  Vitals:   12/27/23 1407  BP: 124/86  Pulse: 75  Temp: 98.4 F (36.9 C)  SpO2: 97%    Pt presents for ear pain on the right    Seen on 4/29 for OM on right with positive strep test  Took amox 875 mg bid   Some improved   Ear ache - not as bad but still there  Feels like swimmer's ear  No water in it that he knows of   Hears popping once in a while  Numb feeling  Full feeling  No ringing  Some trouble hearing out of it   Procedure on this ear from ENT in past     Throat is better /some sinus drainage   Using flonase  once daily    Has seasonal allergies/runny nose/pnd    Patient Active Problem List   Diagnosis Date Noted   Otitis media, right 12/17/2023   Sore throat 12/17/2023   Spinal stenosis of lumbar region 10/29/2023   Diabetic neuropathy (HCC) 10/29/2023   Family history of prostate cancer 08/28/2023   Peripheral neuropathy 02/08/2023   Heel pain, bilateral 02/08/2023   Dyspnea on exertion 07/30/2022   OSA (obstructive sleep apnea) 04/18/2022   Loud snoring 12/12/2021   Family history of colon cancer    Lump on finger 11/06/2021   Pseudophakia, left eye 10/26/2021   Pseudophakia, right eye 10/26/2021   Vitreomacular adhesion of left eye 09/21/2021   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/2021   Vitreous hemorrhage, left eye (HCC) 06/07/2021   Proliferative diabetic retinopathy of left eye with macular edema associated with type 2 diabetes mellitus (HCC) 06/07/2021   Localized swelling, mass and lump, upper limb 05/31/2021   Right ear pain 04/12/2021   Low vitamin D  level 09/14/2020   Prostate cancer screening 09/02/2020   Current use of proton pump inhibitor 09/02/2020   Family history of factor V  Leiden mutation 05/30/2020   Left ankle pain 01/29/2020   Obesity (BMI 30-39.9) 12/24/2019   Proliferative diabetic retinopathy of left eye determined by examination (HCC) 12/24/2019   Nuclear sclerotic cataract of left eye 12/24/2019   Low testosterone  11/13/2019   Fatigue 10/20/2019   Neck pain 04/28/2018   Need for hepatitis B screening test 03/26/2018   Need for hepatitis C screening test 03/26/2018   Paresthesia of both feet 11/01/2017   Hemorrhoids 10/14/2017   Class 2 obesity due to excess calories with body mass index (BMI) of 35.0 to 35.9 in adult 01/24/2016   Hypothyroidism 01/24/2016   Type 2 diabetes mellitus with ophthalmic complication (HCC) 07/28/2015   Alcohol consumption heavy 12/06/2014   Routine general medical examination at a health care facility 09/30/2013   Low back pain 06/17/2012   PLANTAR FASCIITIS, TRAUMATIC 11/09/2009   NEOPLASM UNCERTAIN BEHAVIOR OTHER SPEC SITES 10/07/2009   ESOPHAGEAL STRICTURE 06/27/2009   GERD 03/31/2009   Hyperlipidemia associated with type 2 diabetes mellitus (HCC) 01/06/2009   Essential hypertension 01/06/2009   Allergic rhinitis 01/06/2009   Past Medical History:  Diagnosis Date   Allergy    allegic rhinitis   Diabetes mellitus    type II   Esophageal stricture  GERD (gastroesophageal reflux disease)    History of hernia repair    as a baby   Hyperlipidemia    Hyperplastic polyp of intestine 2010   Hypertension    OSA (obstructive sleep apnea) 04/18/2022   Proliferative diabetic retinopathy of left eye (HCC) 03/31/2020   This is active as a patient still has tufts of the anterior segment neovascularization, rubeosis in the left eye every region of capillary nonperfusion should be treated with PRP.   S/P ear surgery, follow-up exam    for drainage   Vitreous hemorrhage of right eye (HCC) 12/24/2019   Past Surgical History:  Procedure Laterality Date   BIOPSY  12/07/2021   Procedure: BIOPSY;  Surgeon: Asencion Blacksmith,  MD;  Location: [facility];  Service: Gastroenterology;;   CATARACT EXTRACTION     COLONOSCOPY  03/07/2016   COLONOSCOPY WITH PROPOFOL  N/A 12/07/2021   Procedure: COLONOSCOPY WITH PROPOFOL ;  Surgeon: Asencion Blacksmith, MD;  Location: [facility];  Service: Gastroenterology;  Laterality: N/A;   ESOPHAGOGASTRODUODENOSCOPY  04/20/2009   stricture/GERD   ESOPHAGOGASTRODUODENOSCOPY (EGD) WITH PROPOFOL  N/A 12/07/2021   Procedure: ESOPHAGOGASTRODUODENOSCOPY (EGD) WITH PROPOFOL ;  Surgeon: Asencion Blacksmith, MD;  Location: [facility];  Service: Gastroenterology;  Laterality: N/A;   EYE SURGERY     HERNIA REPAIR     age 62-midline   INNER EAR SURGERY  08/20/1986   went in behind rt ear-blockage   KNEE ARTHROSCOPY Right 2023   NECK SURGERY     [facility]   POLYPECTOMY     SAVORY DILATION N/A 12/07/2021   Procedure: SAVORY DILATION;  Surgeon: Asencion Blacksmith, MD;  Location: [facility];  Service: Gastroenterology;  Laterality: N/A;   SHOULDER ARTHROSCOPY WITH ROTATOR CUFF REPAIR AND SUBACROMIAL DECOMPRESSION Right 09/29/2012   Procedure: SHOULDER ARTHROSCOPY WITH ROTATOR CUFF REPAIR AND SUBACROMIAL DECOMPRESSION;  Surgeon: Derald Flattery, MD;  Location: [facility];  Service: Orthopedics;  Laterality: Right;  Right shoulder arthroscopy with subacromial decompression and distal clavicle excision & Debridement of Labrial Tear   SHOULDER SURGERY Left    x2   UPPER GASTROINTESTINAL ENDOSCOPY     Social History   Tobacco Use   Smoking status: Never   Smokeless tobacco: Never  Vaping Use   Vaping status: Never Used  Substance Use Topics   Alcohol use: Yes    Alcohol/week: 12.0 standard drinks of alcohol    Types: 12 Cans of beer per week   Drug use: No   Family History  Problem Relation Age of Onset   Diabetes Mother    Colon cancer Father 47   Diabetes Other    Colon polyps Brother    Diabetes Maternal Aunt    Cancer Cousin        breast cancer    Allergies  Allergen Reactions   Codeine     Anxious   Lipitor [Atorvastatin ]     Muscle pain    Current Outpatient Medications on File Prior to Visit  Medication Sig Dispense Refill   B-D UF III MINI PEN NEEDLES 31G X 5 MM MISC USE AS DIRECTED 4 TIMES A DAY 100 each 3   cetirizine (ZYRTEC) 10 MG tablet Take 10 mg by mouth daily.     Cholecalciferol (VITAMIN D ) 50 MCG (2000 UT) tablet Take 2,000 Units by mouth daily.     Continuous Glucose Sensor (FREESTYLE LIBRE 3 SENSOR) MISC APPLY 1 SENSOR EVERY 14 DAYS 6 each 3   cyclobenzaprine  (FLEXERIL ) 10 MG tablet Take  1 tablet (10 mg total) by mouth 3 (three) times daily as needed. 30 tablet 3   DULoxetine  (CYMBALTA ) 60 MG capsule Take 1 capsule (60 mg total) by mouth daily. 30 capsule 11   fluticasone  (FLONASE ) 50 MCG/ACT nasal spray PLACE 2 SPRAYS INTO BOTH NOSTRILS DAILY AS NEEDED FOR ALLERGIES OR RHINITIS. 16 mL 2   gabapentin  (NEURONTIN ) 100 MG capsule Take 2 capsules (200 mg total) by mouth 2 (two) times daily. 360 capsule 0   insulin  glargine, 1 Unit Dial , (TOUJEO  SOLOSTAR) 300 UNIT/ML Solostar Pen INJECT 42 UNITS INTO THE SKIN EVERY MORNING AND 50 UNITS INTO THE SKIN EVERY EVENING 30 mL 2   insulin  lispro (HUMALOG ) 100 UNIT/ML KwikPen INJECT 25-30 UNITS UNDER THE SKIN THREE TIMES A DAY WITH MEALS AND 10 TO 12 UNITS UNDER THE SKIN WITH SNACKS 60 mL 3   JARDIANCE  25 MG TABS tablet TAKE 1 TABLET BY MOUTH EVERY DAY BEFORE BREAKFAST 90 tablet 3   levothyroxine  (SYNTHROID ) 75 MCG tablet TAKE 1 TABLET BY MOUTH DAILY BEFORE BREAKFAST 30 tablet 2   olmesartan  (BENICAR ) 5 MG tablet Take 1 tablet (5 mg total) by mouth daily. 90 tablet 3   omeprazole  (PRILOSEC) 40 MG capsule TAKE 1 CAPSULE BY MOUTH EVERY DAY 30 capsule 10   ONETOUCH ULTRA test strip USE TO TEST BLOOD SUGAR 4 TIMES DAILY AS DIRECTED 400 strip 11   rosuvastatin  (CRESTOR ) 10 MG tablet TAKE 1 TABLET BY MOUTH EVERY DAY 90 tablet 1   tadalafil  (CIALIS ) 20 MG tablet TAKE 1 TABLET BY MOUTH  EVERY DAY AS NEEDED FOR ERECTILE DYSFUNCTION 4 tablet 5   tirzepatide  (MOUNJARO ) 15 MG/0.5ML Pen INJECT 15 MG UNDER THE SKIN ONCE WEEKLY 6 mL 1   vitamin B-12 (CYANOCOBALAMIN ) 500 MCG tablet Take 500 mcg by mouth daily.     No current facility-administered medications on file prior to visit.    Review of Systems  Constitutional:  Negative for activity change, appetite change, fatigue, fever and unexpected weight change.  HENT:  Positive for ear pain, postnasal drip and rhinorrhea. Negative for congestion, ear discharge, facial swelling, sore throat and trouble swallowing.   Eyes:  Negative for pain, redness, itching and visual disturbance.  Respiratory:  Negative for cough, chest tightness, shortness of breath and wheezing.   Cardiovascular:  Negative for chest pain and palpitations.  Gastrointestinal:  Negative for abdominal pain, blood in stool, constipation, diarrhea and nausea.  Endocrine: Negative for cold intolerance, heat intolerance, polydipsia and polyuria.  Genitourinary:  Negative for difficulty urinating, dysuria, frequency and urgency.  Musculoskeletal:  Negative for arthralgias, joint swelling and myalgias.  Skin:  Negative for pallor and rash.  Neurological:  Negative for dizziness, tremors, weakness, numbness and headaches.  Hematological:  Negative for adenopathy. Does not bruise/bleed easily.  Psychiatric/Behavioral:  Negative for decreased concentration and dysphoric mood. The patient is not nervous/anxious.        Objective:   Physical Exam Constitutional:      General: He is not in acute distress.    Appearance: Normal appearance. He is obese. He is not ill-appearing or diaphoretic.  HENT:     Head: Normocephalic and atraumatic.     Comments: No temporal tenderness       Right Ear: External ear normal.     Left Ear: Tympanic membrane and ear canal normal.     Ears:     Comments: Partial cerumen bilaterally   Right TM appears mildly dull  No erythema or  bulging  No ear drainage Some dry skin in ear canals  No rash  No mastoid tenderness     Nose:     Comments: Boggy nares     Mouth/Throat:     Mouth: Mucous membranes are moist.     Pharynx: Oropharynx is clear. No oropharyngeal exudate or posterior oropharyngeal erythema.  Eyes:     General:        Right eye: No discharge.        Left eye: No discharge.     Conjunctiva/sclera: Conjunctivae normal.     Pupils: Pupils are equal, round, and reactive to light.  Cardiovascular:     Rate and Rhythm: Normal rate and regular rhythm.  Pulmonary:     Effort: Pulmonary effort is normal. No respiratory distress.     Breath sounds: No rales.  Musculoskeletal:     Cervical back: Neck supple.  Lymphadenopathy:     Cervical: No cervical adenopathy.  Skin:    General: Skin is warm and dry.     Findings: No erythema or rash.  Neurological:     Mental Status: He is alert.     Cranial Nerves: No cranial nerve deficit.  Psychiatric:        Mood and Affect: Mood normal.           Assessment & Plan:   Problem List Items Addressed This Visit       Other   Right ear pain - Primary   S/p OM- overall improved after course of amoxicillin  but still feels full/muffled and sometimes itchy  Has had surgery on this ear in the past  On exam- TM appears normal , some cerumen but not impacted   Possible etd (with env allergies)  Instructed to increase flonase  to bid for a week  Also sent cortisporin otic suspension for canal irritation   Update if not starting to improve in a week or if worsening  Call back and Er precautions noted in detail today

## 2023-12-27 NOTE — Patient Instructions (Addendum)
 Go up on your flonase  to 2 sprays in each nostril TWICE daily for a week to help open your ear tubes and sinuses   Try the cortisporin ear drops also as directed -this would help any canal symptoms  The ear infection looks better  There is a little ear wax also   Update if not starting to improve in a week or if worsening   May need to see ear doctor if not improving

## 2023-12-30 ENCOUNTER — Encounter: Payer: Self-pay | Admitting: Internal Medicine

## 2023-12-30 ENCOUNTER — Ambulatory Visit (INDEPENDENT_AMBULATORY_CARE_PROVIDER_SITE_OTHER): Admitting: Internal Medicine

## 2023-12-30 VITALS — BP 120/70 | HR 75 | Ht 69.0 in | Wt 219.4 lb

## 2023-12-30 DIAGNOSIS — E7800 Pure hypercholesterolemia, unspecified: Secondary | ICD-10-CM

## 2023-12-30 DIAGNOSIS — E039 Hypothyroidism, unspecified: Secondary | ICD-10-CM | POA: Diagnosis not present

## 2023-12-30 DIAGNOSIS — E11319 Type 2 diabetes mellitus with unspecified diabetic retinopathy without macular edema: Secondary | ICD-10-CM | POA: Diagnosis not present

## 2023-12-30 DIAGNOSIS — Z794 Long term (current) use of insulin: Secondary | ICD-10-CM

## 2023-12-30 LAB — POCT GLYCOSYLATED HEMOGLOBIN (HGB A1C): Hemoglobin A1C: 6.7 % — AB (ref 4.0–5.6)

## 2023-12-30 NOTE — Patient Instructions (Signed)
 Please continue: - Jardiance  25 mg before breakfast - Mounjaro  15 mg weekly - Humalog  (15 min before each meal) 25-30 units for breakfast but 20-25 units before the rest of the meals - Lantus  30-40 units 2x a day (use the lower dose before your shift)  Please continue levothyroxine  75 mcg daily.  Take the thyroid  hormone every day, with water, at least 30 minutes before breakfast, separated by at least 4 hours from: - acid reflux medications - calcium  - iron - multivitamins  Please return in 3-4 months.

## 2023-12-30 NOTE — Progress Notes (Signed)
 Continue subjective:     Patient ID: Mitchell Palmer, male   DOB: 05/18/1966, 58 y.o.   MRN: [id number]   HPI Mitchell Palmer is a 58 y.o. man, returning for followup for DM2, dx in 2000, uncontrolled, insulin -dependent, with complications (CKD stage 3, erectile dysfunction, mild DR). Last visit 4 months ago.  Interim history: He continues to have steroid injections for generalized joint pains.  He had knee surgery since last visit.  Last steroid inj 07/2023. Sugars are high after the injections.  He has plantar fasciitis. No increased urination, nausea, chest pain.  He has blurry vision and increased pressure in the right eye (on IO injections). He was started on Cymbalta  by neurology (for peripheral neuropathy),- some dizziness. This improved. He relaxed his diet lately: Eating a lot of strawberries with shortcake.  Reviewed HbA1c levels: Lab Results  Component Value Date   HGBA1C 6.9 (H) 10/29/2023   HGBA1C 6.6 (A) 08/29/2023   HGBA1C 6.9 04/25/2023   HGBA1C 7.3 (A) 11/27/2022   HGBA1C 6.8 (A) 07/25/2022   HGBA1C 7.3 (A) 03/21/2022   HGBA1C 8.3 (A) 11/06/2021   HGBA1C 8.1 (A) 02/22/2021   HGBA1C 8.0 (A) 10/17/2020   HGBA1C 7.8 (A) 05/11/2020   HGBA1C 7.5 (A) 01/07/2020   HGBA1C 7.5 (A) 09/08/2019   HGBA1C 7.1 (A) 04/16/2019   HGBA1C 7.6 (A) 09/03/2018   HGBA1C 8.2 (A) 05/21/2018   HGBA1C 7.4 (A) 01/27/2018   HGBA1C 7.6 10/21/2017   HGBA1C 8.0 05/10/2017   HGBA1C 7.4 11/15/2016   HGBA1C 7.6 08/15/2016  04/25/2023: HbA1c 6.9%  He is on: - Jardiance  25 mg daily - Mounjaro  10 >> 12.5 >> 15 units weekly - Lantus  40-46 units 2x a day >> 30-40 units 2x a day (use the lower dose before starting the shift) >> will switch to Toujeo  - Humalog   (15 min before each meal) 25-30 units for breakfast but 20-25 units before the rest of the meals - sometimes taken after the meal.  Uses 10-15 units for correction.  He was on: - Invokana  - stopped 08/2016 - leg tingling - Cycloset  - started  04/22/2013 >> at 1.6 mg daily (tried 3 tabs >> dizziness >> backed off to 2) - Victoza  1.8 mg daily - Actos 45 mg daily - Januvia - glyburide/metformin  - Avandia. - Bydureon  2 mg weekly - he hated the thick needle  - Trulicity  - Basaglar  - Metformin  - stopped 2023 2/2 fatigue  He is checking his CBG more than 4 times a day with his newly acquired freestyle libre 3 CGM: Last 3 mo: GMI: 7.2% Last week: GMI: 7.2%     Previously:  Previously:   Lowest: 58 >> 57 >> 59;  He has hypoglycemia awareness in the 80s. Highest 400 (steroids) >> .Aaron AasAaron Aas  350s >> 300s >> 300s.  He works nights: 6 PM to 7:30 AM 3-4 days a week.  Meals: - 5:30-6 pm (at work): subway sandwich >> 2 hot dogs + doritos >> chicken biscuit - snack at 9-10 pm: pistachios or 1/2 sandwich; pizza  >> 12 am: nabs +peanuts, apples - 11 am-12 pm: salad, meat + veggies + rice (leftovers from take out), pizza  + HL. Last lipid panel: Lab Results  Component Value Date   CHOL 129 05/01/2023   HDL 50.50 05/01/2023   LDLCALC 53 05/01/2023   LDLDIRECT 63.0 10/27/2021   TRIG 126.0 05/01/2023   CHOLHDL 3 05/01/2023  On Lipitor 40.  -+ Stage II CKD, last BUN/Cr:  Lab Results  Component Value Date   BUN 17 05/01/2023   CREATININE 1.18 05/01/2023   Lab Results  Component Value Date   MICRALBCREAT 1.2 04/25/2023   MICRALBCREAT 0.9 02/15/2016   MICRALBCREAT 1.0 12/07/2014   MICRALBCREAT 0.5 11/04/2012  On Benicar  40 >> 20 >> 5 mg daily.  - last eye exam was 10/2023: + DR.  Dr. Candi Chafe >> now Dr. Seward Dao.  He had laser surgery [facility].  Also, In 07/2018 >> had EMG surgery in his R eye for bleeding. He had cataract sx (Dr. Candi Chafe), another surgery (Dr. Seward Dao) on R eye in 2021.  Getting intraocular injections.  - no numbness or tingling in his legs.  Foot exam -11/07/2023 (Dr. Alvah Auerbach).  He also has a history of HTN, GERD, esophageal stricture, plantar fasciitis, eustachian tube dysfunction - status post ear  surgery.  Hypothyroidism: -Started levothyroxine  01/2016 -He was skipping doses in the past and TFTs were fluctuating.  Pt is on levothyroxine  75 mcg daily, taken: - in am - fasting - at least 30 min from b'fast - no calcium  - no iron - no multivitamins - no PPIs - not on Biotin  Reviewed his TFTs: Lab Results  Component Value Date   TSH 2.30 05/01/2023   TSH 2.66 02/11/2023   TSH 4.19 10/27/2021   TSH 2.47 09/02/2020   TSH 1.85 10/20/2019   TSH 2.95 10/21/2018   TSH 5.18 (H) 09/18/2018   TSH 4.25 10/31/2017   TSH 1.17 09/21/2016   TSH 3.05 04/24/2016   In 07/2018, he had C spine surgery.  Review of Systems + see HPI  I reviewed pt's medications, allergies, PMH, social hx, family hx, and changes were documented in the history of present illness. Otherwise, unchanged from my initial visit note.  Past Medical History:  Diagnosis Date   Allergy    allegic rhinitis   Diabetes mellitus    type II   Esophageal stricture    GERD (gastroesophageal reflux disease)    History of hernia repair    as a baby   Hyperlipidemia    Hyperplastic polyp of intestine 2010   Hypertension    OSA (obstructive sleep apnea) 04/18/2022   Proliferative diabetic retinopathy of left eye (HCC) 03/31/2020   This is active as a patient still has tufts of the anterior segment neovascularization, rubeosis in the left eye every region of capillary nonperfusion should be treated with PRP.   S/P ear surgery, follow-up exam    for drainage   Vitreous hemorrhage of right eye (HCC) 12/24/2019   Past Surgical History:  Procedure Laterality Date   BIOPSY  12/07/2021   Procedure: BIOPSY;  Surgeon: Asencion Blacksmith, MD;  Location: [facility];  Service: Gastroenterology;;   CATARACT EXTRACTION     COLONOSCOPY  03/07/2016   COLONOSCOPY WITH PROPOFOL  N/A 12/07/2021   Procedure: COLONOSCOPY WITH PROPOFOL ;  Surgeon: Asencion Blacksmith, MD;  Location: [facility];  Service: Gastroenterology;  Laterality:  N/A;   ESOPHAGOGASTRODUODENOSCOPY  04/20/2009   stricture/GERD   ESOPHAGOGASTRODUODENOSCOPY (EGD) WITH PROPOFOL  N/A 12/07/2021   Procedure: ESOPHAGOGASTRODUODENOSCOPY (EGD) WITH PROPOFOL ;  Surgeon: Asencion Blacksmith, MD;  Location: [facility];  Service: Gastroenterology;  Laterality: N/A;   EYE SURGERY     HERNIA REPAIR     age 16-midline   INNER EAR SURGERY  08/20/1986   went in behind rt ear-blockage   KNEE ARTHROSCOPY Right 2023   NECK SURGERY     [facility]   POLYPECTOMY     SAVORY DILATION N/A  12/07/2021   Procedure: SAVORY DILATION;  Surgeon: Asencion Blacksmith, MD;  Location: [facility];  Service: Gastroenterology;  Laterality: N/A;   SHOULDER ARTHROSCOPY WITH ROTATOR CUFF REPAIR AND SUBACROMIAL DECOMPRESSION Right 09/29/2012   Procedure: SHOULDER ARTHROSCOPY WITH ROTATOR CUFF REPAIR AND SUBACROMIAL DECOMPRESSION;  Surgeon: Derald Flattery, MD;  Location: [facility];  Service: Orthopedics;  Laterality: Right;  Right shoulder arthroscopy with subacromial decompression and distal clavicle excision & Debridement of Labrial Tear   SHOULDER SURGERY Left    x2   UPPER GASTROINTESTINAL ENDOSCOPY     Social History   Socioeconomic History   Marital status: Single    Spouse name: Not on file   Number of children: Not on file   Years of education: Not on file   Highest education level: Not on file  Occupational History   Occupation: Designer, television/film set  Tobacco Use   Smoking status: Never   Smokeless tobacco: Never  Vaping Use   Vaping status: Never Used  Substance and Sexual Activity   Alcohol use: Yes    Alcohol/week: 12.0 standard drinks of alcohol    Types: 12 Cans of beer per week   Drug use: No   Sexual activity: Not on file  Other Topics Concern   Not on file  Social History Narrative   Not on file   Social Drivers of Health   Financial Resource Strain: Not on file  Food Insecurity: Not on file  Transportation Needs: Not on file  Physical  Activity: Not on file  Stress: Not on file  Social Connections: Not on file  Intimate Partner Violence: Not on file   Current Outpatient Medications on File Prior to Visit  Medication Sig Dispense Refill   B-D UF III MINI PEN NEEDLES 31G X 5 MM MISC USE AS DIRECTED 4 TIMES A DAY 100 each 3   cetirizine (ZYRTEC) 10 MG tablet Take 10 mg by mouth daily.     Cholecalciferol (VITAMIN D ) 50 MCG (2000 UT) tablet Take 2,000 Units by mouth daily.     Continuous Glucose Sensor (FREESTYLE LIBRE 3 SENSOR) MISC APPLY 1 SENSOR EVERY 14 DAYS 6 each 3   cyclobenzaprine  (FLEXERIL ) 10 MG tablet Take 1 tablet (10 mg total) by mouth 3 (three) times daily as needed. 30 tablet 3   DULoxetine  (CYMBALTA ) 60 MG capsule Take 1 capsule (60 mg total) by mouth daily. 30 capsule 11   fluticasone  (FLONASE ) 50 MCG/ACT nasal spray PLACE 2 SPRAYS INTO BOTH NOSTRILS DAILY AS NEEDED FOR ALLERGIES OR RHINITIS. 16 mL 2   gabapentin  (NEURONTIN ) 100 MG capsule Take 2 capsules (200 mg total) by mouth 2 (two) times daily. 360 capsule 0   insulin  glargine, 1 Unit Dial , (TOUJEO  SOLOSTAR) 300 UNIT/ML Solostar Pen INJECT 42 UNITS INTO THE SKIN EVERY MORNING AND 50 UNITS INTO THE SKIN EVERY EVENING 30 mL 2   insulin  lispro (HUMALOG ) 100 UNIT/ML KwikPen INJECT 25-30 UNITS UNDER THE SKIN THREE TIMES A DAY WITH MEALS AND 10 TO 12 UNITS UNDER THE SKIN WITH SNACKS 60 mL 3   JARDIANCE  25 MG TABS tablet TAKE 1 TABLET BY MOUTH EVERY DAY BEFORE BREAKFAST 90 tablet 3   levothyroxine  (SYNTHROID ) 75 MCG tablet TAKE 1 TABLET BY MOUTH DAILY BEFORE BREAKFAST 30 tablet 2   neomycin-polymyxin-hydrocortisone  (CORTISPORIN) OTIC solution Place 3 drops into the right ear in the morning, at noon, and at bedtime. 10 mL 0   olmesartan  (BENICAR ) 5 MG tablet Take 1 tablet (5 mg total) by  mouth daily. 90 tablet 3   omeprazole  (PRILOSEC) 40 MG capsule TAKE 1 CAPSULE BY MOUTH EVERY DAY 30 capsule 10   ONETOUCH ULTRA test strip USE TO TEST BLOOD SUGAR 4 TIMES DAILY AS  DIRECTED 400 strip 11   rosuvastatin  (CRESTOR ) 10 MG tablet TAKE 1 TABLET BY MOUTH EVERY DAY 90 tablet 1   tadalafil  (CIALIS ) 20 MG tablet TAKE 1 TABLET BY MOUTH EVERY DAY AS NEEDED FOR ERECTILE DYSFUNCTION 4 tablet 5   tirzepatide  (MOUNJARO ) 15 MG/0.5ML Pen INJECT 15 MG UNDER THE SKIN ONCE WEEKLY 6 mL 1   vitamin B-12 (CYANOCOBALAMIN ) 500 MCG tablet Take 500 mcg by mouth daily.     No current facility-administered medications on file prior to visit.   Allergies  Allergen Reactions   Codeine     Anxious   Lipitor [Atorvastatin ]     Muscle pain    Family History  Problem Relation Age of Onset   Diabetes Mother    Colon cancer Father 50   Diabetes Other    Colon polyps Brother    Diabetes Maternal Aunt    Cancer Cousin        breast cancer     Objective:   Physical Exam There were no vitals taken for this visit.    Wt Readings from Last 3 Encounters:  12/27/23 213 lb 6 oz (96.8 kg)  12/25/23 214 lb (97.1 kg)  12/17/23 214 lb 4 oz (97.2 kg)   Constitutional: overweight, in NAD Eyes: no exophthalmos ENT: no masses palpated in neck, no cervical lymphadenopathy Cardiovascular: RRR, No MRG Respiratory: CTA B Musculoskeletal: no deformities Skin: no rashes Neurological: no tremor with outstretched hands  Assessment:     1. 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 HL  3. Hypothyroidism    Plan:     1.  Patient with longstanding, uncontrolled, type 2 diabetes, on a complex medication regimen including SGLT2 inhibitor, weekly GLP-1/GIP receptor agonist and basal-bolus insulin , with slightly worse control at last check, 2 months ago, when an HbA1c returned 6.9%, increased from 6.6% at last visit.  At last visit, sugars appeared to be higher after every meal, especially after his last meal of the day.  He did start to improve diet and  sugars started to improve before the visit so we did not change his regimen at that time.  Of note, he works nights and he is grazing and also getting steroid injections, which are hindering our efforts in controlling his diabetes. CGM interpretation: -At today's visit, we reviewed his CGM downloads and for the last week it appears that 65% of values are in target range (goal >70%), while 34% are higher than 180 (goal <25%), and 1% are lower than 70 (goal <4%).  The projected HbA1c for the next 3 months (GMI) is 7.2%. -Reviewing the CGM trends, sugars are fluctuating mostly within the target range, but with a hyperglycemic spike after lunch and then again after dinner.  Sugars after dinner are more consistently elevated, and he mentions that he is taking Lantus , then goes to work and eats there, without taking Humalog  before the meal.  He takes it afterwards.  I advised him to always try to take the Humalog  15 minutes before meals.  In case the sugars are low, he can move this closer to the meal and if the sugars are higher, he  can take it even more in advance.  I did not change the doses at today's visit but we did discuss about stopping shortcake. - I advised him to: Patient Instructions  Please continue: - Jardiance  25 mg before breakfast - Mounjaro  15 mg weekly - Humalog  (15 min before each meal) 25-30 units for breakfast but 20-25 units before the rest of the meals - Lantus  30-40 units 2x a day (use the lower dose before your shift)  Please continue levothyroxine  75 mcg daily.  Take the thyroid  hormone every day, with water, at least 30 minutes before breakfast, separated by at least 4 hours from: - acid reflux medications - calcium  - iron - multivitamins  Please return in 3-4 months.  - we checked his HbA1c: 6.7%  (lower) - advised to check sugars at different times of the day - 4x a day, rotating check times - advised for yearly eye exams >> he is UTD - will check an ACR today -  return to clinic in 3-4 months  2. HL  - Latest lipid panel was reviewed from 04/2023: All fractions at goal: Lab Results  Component Value Date   CHOL 129 05/01/2023   HDL 50.50 05/01/2023   LDLCALC 53 05/01/2023   LDLDIRECT 63.0 10/27/2021   TRIG 126.0 05/01/2023   CHOLHDL 3 05/01/2023  -He continues on Lipitor 40 mg daily without side effects  3. Hypothyroidism - latest thyroid  labs reviewed with pt. >> normal: Lab Results  Component Value Date   TSH 2.30 05/01/2023  - he continues on LT4 75 mcg daily - pt feels good on this dose. - we discussed about taking the thyroid  hormone every day, with water, >30 minutes before breakfast, separated by >4 hours from acid reflux medications, calcium , iron, multivitamins. Pt. is taking it correctly.  Emilie Harden, MD PhD [facility]

## 2023-12-31 ENCOUNTER — Other Ambulatory Visit: Payer: Self-pay

## 2023-12-31 ENCOUNTER — Ambulatory Visit: Payer: Self-pay | Admitting: Internal Medicine

## 2023-12-31 DIAGNOSIS — M1711 Unilateral primary osteoarthritis, right knee: Secondary | ICD-10-CM

## 2023-12-31 LAB — MICROALBUMIN / CREATININE URINE RATIO
Creatinine, Urine: 44 mg/dL (ref 20–320)
Microalb, Ur: <0.2 mg/dL

## 2024-01-15 ENCOUNTER — Telehealth: Payer: Self-pay | Admitting: Radiology

## 2024-01-15 ENCOUNTER — Ambulatory Visit (INDEPENDENT_AMBULATORY_CARE_PROVIDER_SITE_OTHER): Admitting: Physician Assistant

## 2024-01-15 DIAGNOSIS — M1711 Unilateral primary osteoarthritis, right knee: Secondary | ICD-10-CM

## 2024-01-15 MED ORDER — SODIUM HYALURONATE 60 MG/3ML IX PRSY
60.0000 mg | PREFILLED_SYRINGE | INTRA_ARTICULAR | Status: AC | PRN
  Administered 2024-01-15: 60 mg via INTRA_ARTICULAR

## 2024-01-15 NOTE — Telephone Encounter (Signed)
 LT KNEE VISCO

## 2024-01-15 NOTE — Progress Notes (Signed)
   Procedure Note  Patient: Mitchell Palmer             Date of Birth: 10-Oct-1965           MRN: [id number]             Visit Date: 01/15/2024  HPI: Mitchell Palmer comes in today for scheduled Durolane injection right knee.  He has known severe right knee patellofemoral arthritis.  He has had no new injury to the knee.  Has had prior viscosupplementation injections which were helpful.  Cortisone injections given inadequate relief in the past.  Has no scheduled surgery on the right knee in the next 6 months. Patient is also asking about left knee injections.  He had viscosupplementation injections in the left knee at another office that were helpful.  He states left knee gives way with him.  He has had no new injury to the knee.  Has pain going up and down stairs.  States that he has been told that he has arthritis of the left knee.  He is diabetic.  He has no scheduled surgery on either knee and neck 6 months.  Review of systems: See HPI otherwise negative  Physical exam: Bilateral knees good range of motion of both knees.  No abnormal warmth erythema or effusion of either knee.  Significant patellofemoral crepitus both knees with range of motion.  No gross instability of either knee.  Impression: Bilateral knee osteoarthritis  Procedures: Visit Diagnoses:  1. Unilateral primary osteoarthritis, right knee     Large Joint Inj: R knee on 01/15/2024 11:35 AM Indications: pain Details: 22 G 1.5 in needle, anterolateral approach  Arthrogram: No  Medications: 60 mg Sodium Hyaluronate 60 MG/3ML Outcome: tolerated well, no immediate complications Procedure, treatment alternatives, risks and benefits explained, specific risks discussed. Consent was given by the patient. Immediately prior to procedure a time out was called to verify the correct patient, procedure, equipment, support staff and site/side marked as required. Patient was prepped and draped in the usual sterile fashion.     Plan: Will  work on getting him scheduled for viscosupplementation injection of the left knee.  He tolerated right knee junction well.  Will see him back in 8 weeks for the right knee to see what type of response he had to the injection.  Questions were encouraged and answered at length

## 2024-01-20 NOTE — Telephone Encounter (Signed)
 Submitted online [employer] for Monovisc thru [employer].

## 2024-01-21 NOTE — Telephone Encounter (Signed)
 VOB submitted for Durolane, left knee due to insurance.

## 2024-02-05 ENCOUNTER — Other Ambulatory Visit: Payer: Self-pay | Admitting: Internal Medicine

## 2024-02-05 ENCOUNTER — Encounter: Payer: Self-pay | Admitting: Orthopaedic Surgery

## 2024-02-06 ENCOUNTER — Telehealth: Payer: Self-pay

## 2024-02-06 DIAGNOSIS — M1712 Unilateral primary osteoarthritis, left knee: Secondary | ICD-10-CM

## 2024-02-06 NOTE — Telephone Encounter (Signed)
Called and left a VM for patient to CB to schedule for gel injection with Dr. Blackman or Gil. See referrals tab.  

## 2024-02-26 ENCOUNTER — Ambulatory Visit: Admitting: Orthopaedic Surgery

## 2024-02-26 ENCOUNTER — Encounter: Payer: Self-pay | Admitting: Orthopaedic Surgery

## 2024-02-26 DIAGNOSIS — M1712 Unilateral primary osteoarthritis, left knee: Secondary | ICD-10-CM

## 2024-02-26 MED ORDER — SODIUM HYALURONATE 60 MG/3ML IX PRSY
60.0000 mg | PREFILLED_SYRINGE | INTRA_ARTICULAR | Status: AC | PRN
  Administered 2024-02-26: 60 mg via INTRA_ARTICULAR

## 2024-02-26 NOTE — Progress Notes (Signed)
   Procedure Note  Patient: Mitchell Palmer             Date of Birth: [birth year]           MRN: [id number]             Visit Date: 02/26/2024  Procedures: Visit Diagnoses:  1. Unilateral primary osteoarthritis, left knee     Large Joint Inj: L knee on 02/26/2024 10:06 AM Indications: diagnostic evaluation and pain Details: 22 G 1.5 in needle, superolateral approach  Arthrogram: No  Medications: 60 mg Sodium Hyaluronate 60 MG/3ML Outcome: tolerated well, no immediate complications Procedure, treatment alternatives, risks and benefits explained, specific risks discussed. Consent was given by the patient. Immediately prior to procedure a time out was called to verify the correct patient, procedure, equipment, support staff and site/side marked as required. Patient was prepped and draped in the usual sterile fashion.    The patient is here today for scheduled hyaluronic acid injection in his left knee with Durolane to treat the pain from osteoarthritis.  He actually had a similar injection 4 to 5 weeks ago on the right.  He does have a history of a right knee arthroscopy with a lateral release.  That knee does bother him a little more.  He understands fully why we are trying injection such as this to treat the pain from osteoarthritis.  Examination of his left knee today shows no effusion.  There is pain throughout the arc of motion of the knee.  We did place an injection of the Durolane in his left knee that he tolerated well.  He knows to wait at least 6 months with taking these types of injections.  All questions and concerns were addressed and answered.  Lot #[id number]

## 2024-03-04 ENCOUNTER — Encounter: Payer: Self-pay | Admitting: Orthopaedic Surgery

## 2024-03-11 ENCOUNTER — Ambulatory Visit: Admitting: Orthopaedic Surgery

## 2024-03-17 ENCOUNTER — Other Ambulatory Visit: Payer: Self-pay | Admitting: Internal Medicine

## 2024-03-17 DIAGNOSIS — E11319 Type 2 diabetes mellitus with unspecified diabetic retinopathy without macular edema: Secondary | ICD-10-CM

## 2024-03-17 NOTE — Telephone Encounter (Signed)
 Refill request complete

## 2024-03-30 ENCOUNTER — Ambulatory Visit: Admitting: Orthopaedic Surgery

## 2024-03-30 ENCOUNTER — Encounter: Payer: Self-pay | Admitting: Orthopaedic Surgery

## 2024-03-30 DIAGNOSIS — M1712 Unilateral primary osteoarthritis, left knee: Secondary | ICD-10-CM

## 2024-03-30 DIAGNOSIS — M1711 Unilateral primary osteoarthritis, right knee: Secondary | ICD-10-CM | POA: Diagnosis not present

## 2024-03-30 NOTE — Progress Notes (Signed)
 The patient comes in for follow-up after having hyaluronic acid injection in his left knee most recently in early July and his right knee at the end of May.  He does have patellofemoral arthritis that is quite significant.  One of our colleagues in town did a right knee arthroscopy with a lateral release remotely on the right knee and that is done well.  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  He feels like he can resume full work duties without restrictions starting Monday, September 1 and did request a note for that which is appropriate.  He has been on light duty up until now.  I did give him a new note today to allow full work duty starting Monday, September 1.    On examination of both knees both knees have good alignment overall with some patellofemoral crepitation but both knees are cool with no effusion at all.  Both knees are ligamentously stable with no medial or lateral tenderness.  He knows to at least wait 6 months between gel injections if needed.  He will still work on quad strengthening exercises and avoid high impact aerobic activities.  All questions and concerns were answered addressed.  Follow-up is as needed.

## 2024-04-13 LAB — HM DIABETES EYE EXAM

## 2024-04-22 ENCOUNTER — Other Ambulatory Visit: Payer: Self-pay | Admitting: Cardiology

## 2024-04-22 DIAGNOSIS — I10 Essential (primary) hypertension: Secondary | ICD-10-CM

## 2024-04-23 ENCOUNTER — Ambulatory Visit: Admitting: Family Medicine

## 2024-04-23 ENCOUNTER — Encounter: Payer: Self-pay | Admitting: Family Medicine

## 2024-04-23 ENCOUNTER — Ambulatory Visit: Payer: Self-pay | Admitting: Family Medicine

## 2024-04-23 VITALS — BP 142/86 | HR 94 | Temp 99.0°F | Ht 69.0 in | Wt 212.2 lb

## 2024-04-23 DIAGNOSIS — Z23 Encounter for immunization: Secondary | ICD-10-CM

## 2024-04-23 DIAGNOSIS — M545 Low back pain, unspecified: Secondary | ICD-10-CM | POA: Diagnosis not present

## 2024-04-23 DIAGNOSIS — M48061 Spinal stenosis, lumbar region without neurogenic claudication: Secondary | ICD-10-CM | POA: Diagnosis not present

## 2024-04-23 DIAGNOSIS — S0993XA Unspecified injury of face, initial encounter: Secondary | ICD-10-CM

## 2024-04-23 DIAGNOSIS — E11319 Type 2 diabetes mellitus with unspecified diabetic retinopathy without macular edema: Secondary | ICD-10-CM

## 2024-04-23 DIAGNOSIS — Z794 Long term (current) use of insulin: Secondary | ICD-10-CM

## 2024-04-23 DIAGNOSIS — I10 Essential (primary) hypertension: Secondary | ICD-10-CM

## 2024-04-23 LAB — POC URINALSYSI DIPSTICK (AUTOMATED)
Bilirubin, UA: NEGATIVE
Blood, UA: NEGATIVE
Glucose, UA: POSITIVE — AB
Ketones, UA: NEGATIVE
Leukocytes, UA: NEGATIVE
Nitrite, UA: NEGATIVE
Protein, UA: NEGATIVE
Spec Grav, UA: 1.01 (ref 1.010–1.025)
Urobilinogen, UA: 0.2 U/dL
pH, UA: 6 (ref 5.0–8.0)

## 2024-04-23 LAB — MICROALBUMIN / CREATININE URINE RATIO
Creatinine,U: 28.5 mg/dL
Microalb Creat Ratio: UNDETERMINED mg/g (ref 0.0–30.0)
Microalb, Ur: <0.7 mg/dL

## 2024-04-23 NOTE — Assessment & Plan Note (Signed)
 Blood pressure is up today in setting of not getting all of his medication this am  Asked pt to check blood pressure at home when relaxed and back on medication and to message us  with result   Continues benicar  40 mg daily

## 2024-04-23 NOTE — Assessment & Plan Note (Signed)
 Chronic right sided low back pain with sciatica Waxes and wanes  Worse lately  Would like to see an othopedic provider at Dr Damian office is possible Is open to idea of injections if they could help  L4-L5 spinal stenosis on last MRI in 2024  Reassuring exam without neuro deficits   Referral made to orthopedics  Into re: sciatica rehab to try  May also benefit from orthopedics

## 2024-04-23 NOTE — Assessment & Plan Note (Signed)
 Sees endocrinology   U micro due-done today

## 2024-04-23 NOTE — Progress Notes (Signed)
 Subjective:    Patient ID: Mitchell Palmer, male    DOB: September 07, 1965, 58 y.o.   MRN: [id number]  HPI  Wt Readings from Last 3 Encounters:  04/23/24 212 lb 4 oz (96.3 kg)  12/30/23 219 lb 6.4 oz (99.5 kg)  12/27/23 213 lb 6 oz (96.8 kg)   31.34 kg/m  Vitals:   04/23/24 0905  BP: (!) 142/86  Pulse: 94  Temp: 99 F (37.2 C)  SpO2: 99%   Pt presents with  Low back pain  Also left face swelling   3 weeks  Hit his face /left side after he tripped with his dog  Face hit concrete- was outside  It did cut the skin / bled  Still some swelling and neck is a little stiff  Tetanus utd 2022   Back pain  (injured 24 years ago- getting out of a truck)  Has right sciatic nerve problem  Dr Onita gave him cymbalta  for neuropathy - and intol of it-gave him crazy thoughts  Gabapentin  prior to that did not help a lot   Right lower back and radiates to right leg Sharp pain     MRI LS in 2024 MPRESSION: 1. L2-L3 and L3-L4 moderate to severe spinal canal stenosis and mild bilateral neural foraminal narrowing, unchanged. 2. L4-L5 and L5-S1 severe right neural foraminal narrowing, which has progressed from the prior exam. Narrowing of the right lateral recess at L4-L5 could affect the descending right L5 nerve roots. 3. Clumping of the nerve roots at L4-L5 through L5-S1, which can be seen in the setting of arachnoiditis.   Saw Dr Beuford for this  Had talked about injections-he saw the specialist for injection they never called him to set it up      Patient Active Problem List   Diagnosis Date Noted   Facial injury 04/23/2024   Otitis media, right 12/17/2023   Sore throat 12/17/2023   Spinal stenosis of lumbar region 10/29/2023   Diabetic neuropathy (HCC) 10/29/2023   Family history of prostate cancer 08/28/2023   Peripheral neuropathy 02/08/2023   Heel pain, bilateral 02/08/2023   Dyspnea on exertion 07/30/2022   OSA (obstructive sleep apnea) 04/18/2022   Loud snoring  12/12/2021   Family history of colon cancer    Lump on finger 11/06/2021   Pseudophakia, left eye 10/26/2021   Pseudophakia, right eye 10/26/2021   Vitreomacular adhesion of left eye 09/21/2021   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/2021   Vitreous hemorrhage, left eye (HCC) 06/07/2021   Proliferative diabetic retinopathy of left eye with macular edema associated with type 2 diabetes mellitus (HCC) 06/07/2021   Localized swelling, mass and lump, upper limb 05/31/2021   Right ear pain 04/12/2021   Low vitamin D  level 09/14/2020   Prostate cancer screening 09/02/2020   Current use of proton pump inhibitor 09/02/2020   Family history of factor V Leiden mutation 05/30/2020   Left ankle pain 01/29/2020   Obesity (BMI 30-39.9) 12/24/2019   Proliferative diabetic retinopathy of left eye determined by examination (HCC) 12/24/2019   Nuclear sclerotic cataract of left eye 12/24/2019   Low testosterone  11/13/2019   Fatigue 10/20/2019   Neck pain 04/28/2018   Need for hepatitis B screening test 03/26/2018   Need for hepatitis C screening test 03/26/2018   Paresthesia of both feet 11/01/2017   Hemorrhoids 10/14/2017   Class 2 obesity due to excess calories with body mass index (BMI) of 35.0 to 35.9 in adult 01/24/2016   Hypothyroidism 01/24/2016  Type 2 diabetes mellitus with ophthalmic complication (HCC) 07/28/2015   Alcohol consumption heavy 12/06/2014   Routine general medical examination at a health care facility 09/30/2013   Low back pain 06/17/2012   PLANTAR FASCIITIS, TRAUMATIC 11/09/2009   NEOPLASM UNCERTAIN BEHAVIOR OTHER SPEC SITES 10/07/2009   ESOPHAGEAL STRICTURE 06/27/2009   GERD 03/31/2009   Hyperlipidemia associated with type 2 diabetes mellitus (HCC) 01/06/2009   Essential hypertension 01/06/2009   Allergic rhinitis 01/06/2009   Past Medical History:  Diagnosis Date   Allergy    allegic rhinitis   Diabetes mellitus    type II   Esophageal stricture    GERD  (gastroesophageal reflux disease)    History of hernia repair    as a baby   Hyperlipidemia    Hyperplastic polyp of intestine 2010   Hypertension    OSA (obstructive sleep apnea) 04/18/2022   Proliferative diabetic retinopathy of left eye (HCC) 03/31/2020   This is active as a patient still has tufts of the anterior segment neovascularization, rubeosis in the left eye every region of capillary nonperfusion should be treated with PRP.   S/P ear surgery, follow-up exam    for drainage   Vitreous hemorrhage of right eye (HCC) 12/24/2019   Past Surgical History:  Procedure Laterality Date   BIOPSY  12/07/2021   Procedure: BIOPSY;  Surgeon: Aneita Gwendlyn DASEN, MD;  Location: [facility];  Service: Gastroenterology;;   CATARACT EXTRACTION     COLONOSCOPY  03/07/2016   COLONOSCOPY WITH PROPOFOL  N/A 12/07/2021   Procedure: COLONOSCOPY WITH PROPOFOL ;  Surgeon: Aneita Gwendlyn DASEN, MD;  Location: [facility];  Service: Gastroenterology;  Laterality: N/A;   ESOPHAGOGASTRODUODENOSCOPY  04/20/2009   stricture/GERD   ESOPHAGOGASTRODUODENOSCOPY (EGD) WITH PROPOFOL  N/A 12/07/2021   Procedure: ESOPHAGOGASTRODUODENOSCOPY (EGD) WITH PROPOFOL ;  Surgeon: Aneita Gwendlyn DASEN, MD;  Location: [facility];  Service: Gastroenterology;  Laterality: N/A;   EYE SURGERY     HERNIA REPAIR     age 68-midline   INNER EAR SURGERY  08/20/1986   went in behind rt ear-blockage   KNEE ARTHROSCOPY Right 2023   NECK SURGERY     [facility]   POLYPECTOMY     SAVORY DILATION N/A 12/07/2021   Procedure: SAVORY DILATION;  Surgeon: Aneita Gwendlyn DASEN, MD;  Location: [facility];  Service: Gastroenterology;  Laterality: N/A;   SHOULDER ARTHROSCOPY WITH ROTATOR CUFF REPAIR AND SUBACROMIAL DECOMPRESSION Right 09/29/2012   Procedure: SHOULDER ARTHROSCOPY WITH ROTATOR CUFF REPAIR AND SUBACROMIAL DECOMPRESSION;  Surgeon: Eva Elsie Herring, MD;  Location: [facility];  Service: Orthopedics;  Laterality: Right;  Right  shoulder arthroscopy with subacromial decompression and distal clavicle excision & Debridement of Labrial Tear   SHOULDER SURGERY Left    x2   UPPER GASTROINTESTINAL ENDOSCOPY     Social History   Tobacco Use   Smoking status: Never   Smokeless tobacco: Never  Vaping Use   Vaping status: Never Used  Substance Use Topics   Alcohol use: Yes    Alcohol/week: 12.0 standard drinks of alcohol    Types: 12 Cans of beer per week   Drug use: No   Family History  Problem Relation Age of Onset   Diabetes Mother    Colon cancer Father 55   Diabetes Other    Colon polyps Brother    Diabetes Maternal Aunt    Cancer Cousin        breast cancer   Allergies  Allergen Reactions   Codeine  Anxious   Lipitor [Atorvastatin ]     Muscle pain    Current Outpatient Medications on File Prior to Visit  Medication Sig Dispense Refill   BD PEN NEEDLE MINI ULTRAFINE 31G X 5 MM MISC USE AS DIRECTED 4 TIMES A DAY 100 each 3   cetirizine (ZYRTEC) 10 MG tablet Take 10 mg by mouth daily.     Cholecalciferol (VITAMIN D ) 50 MCG (2000 UT) tablet Take 2,000 Units by mouth daily.     Continuous Glucose Sensor (FREESTYLE LIBRE 3 SENSOR) MISC APPLY 1 SENSOR EVERY 14 DAYS 6 each 3   cyclobenzaprine  (FLEXERIL ) 10 MG tablet Take 1 tablet (10 mg total) by mouth 3 (three) times daily as needed. 30 tablet 3   fluticasone  (FLONASE ) 50 MCG/ACT nasal spray PLACE 2 SPRAYS INTO BOTH NOSTRILS DAILY AS NEEDED FOR ALLERGIES OR RHINITIS. 16 mL 2   insulin  glargine, 1 Unit Dial , (TOUJEO  SOLOSTAR) 300 UNIT/ML Solostar Pen INJECT 42 UNITS INTO THE SKIN EVERY MORNING AND 50 UNITS INTO THE SKIN EVERY EVENING 30 mL 2   insulin  lispro (HUMALOG ) 100 UNIT/ML KwikPen INJECT 25-30 UNITS UNDER THE SKIN THREE TIMES A DAY WITH MEALS AND 10 TO 12 UNITS UNDER THE SKIN WITH SNACKS 60 mL 3   JARDIANCE  25 MG TABS tablet TAKE 1 TABLET BY MOUTH EVERY DAY BEFORE BREAKFAST 90 tablet 3   levothyroxine  (SYNTHROID ) 75 MCG tablet TAKE 1 TABLET BY  MOUTH DAILY BEFORE BREAKFAST 30 tablet 2   neomycin -polymyxin-hydrocortisone  (CORTISPORIN) OTIC solution Place 3 drops into the right ear in the morning, at noon, and at bedtime. 10 mL 0   olmesartan  (BENICAR ) 5 MG tablet TAKE 1 TABLET (5 MG TOTAL) BY MOUTH DAILY. 30 tablet 0   omeprazole  (PRILOSEC) 40 MG capsule TAKE 1 CAPSULE BY MOUTH EVERY DAY 30 capsule 10   ONETOUCH ULTRA test strip USE TO TEST BLOOD SUGAR 4 TIMES DAILY AS DIRECTED 400 strip 11   rosuvastatin  (CRESTOR ) 10 MG tablet TAKE 1 TABLET BY MOUTH EVERY DAY 90 tablet 1   tadalafil  (CIALIS ) 20 MG tablet TAKE 1 TABLET BY MOUTH EVERY DAY AS NEEDED FOR ERECTILE DYSFUNCTION 4 tablet 5   tirzepatide  (MOUNJARO ) 15 MG/0.5ML Pen INJECT 15 MG UNDER THE SKIN ONCE WEEKLY 2 mL 3   vitamin B-12 (CYANOCOBALAMIN ) 500 MCG tablet Take 500 mcg by mouth daily.     No current facility-administered medications on file prior to visit.    Review of Systems  Constitutional:  Negative for activity change, appetite change, fatigue, fever and unexpected weight change.  HENT:  Positive for facial swelling. Negative for congestion, rhinorrhea, sore throat and trouble swallowing.   Eyes:  Negative for pain, redness, itching and visual disturbance.  Respiratory:  Negative for cough, chest tightness, shortness of breath and wheezing.   Cardiovascular:  Negative for chest pain and palpitations.  Gastrointestinal:  Negative for abdominal pain, blood in stool, constipation, diarrhea and nausea.  Endocrine: Negative for cold intolerance, heat intolerance, polydipsia and polyuria.  Genitourinary:  Negative for difficulty urinating, dysuria, frequency and urgency.  Musculoskeletal:  Positive for back pain. Negative for arthralgias, joint swelling and myalgias.  Skin:  Negative for pallor and rash.  Neurological:  Negative for dizziness, tremors, weakness, numbness and headaches.  Hematological:  Negative for adenopathy. Does not bruise/bleed easily.   Psychiatric/Behavioral:  Negative for decreased concentration and dysphoric mood. The patient is not nervous/anxious.        Objective:   Physical Exam Constitutional:  General: He is not in acute distress.    Appearance: Normal appearance. He is well-developed. He is obese. He is not ill-appearing.  HENT:     Head: Normocephalic and atraumatic.     Comments: Very mild swelling of left face along jaw line  No bruising Slight tenderness over left brow No skin interruption     Right Ear: Tympanic membrane, ear canal and external ear normal.     Left Ear: Tympanic membrane, ear canal and external ear normal.     Nose: Nose normal.     Mouth/Throat:     Mouth: Mucous membranes are moist.     Pharynx: Oropharynx is clear.  Eyes:     General:        Right eye: No discharge.        Left eye: No discharge.     Conjunctiva/sclera: Conjunctivae normal.     Pupils: Pupils are equal, round, and reactive to light.  Neck:     Thyroid : No thyromegaly.     Vascular: No carotid bruit or JVD.  Cardiovascular:     Rate and Rhythm: Regular rhythm. Tachycardia present.     Heart sounds: Normal heart sounds.     No gallop.  Pulmonary:     Effort: Pulmonary effort is normal. No respiratory distress.     Breath sounds: Normal breath sounds. No stridor. No wheezing, rhonchi or rales.  Abdominal:     General: There is no distension or abdominal bruit.     Palpations: Abdomen is soft.  Musculoskeletal:     Cervical back: Normal range of motion and neck supple.     Lumbar back: Spasms and tenderness present. No swelling or deformity. Decreased range of motion. Positive right straight leg raise test. Negative left straight leg raise test.     Right lower leg: No edema.     Left lower leg: No edema.     Comments: LS flex 50 deg/ extension 10-15 deg with pain  Tender in r lumbar musculature  SLR positive for upper leg pain on right   No neuro changes   Lymphadenopathy:     Cervical: No  cervical adenopathy.  Skin:    General: Skin is warm and dry.     Coloration: Skin is not pale.     Findings: No bruising or rash.  Neurological:     Mental Status: He is alert.     Cranial Nerves: No cranial nerve deficit.     Motor: No weakness.     Coordination: Coordination normal.     Deep Tendon Reflexes: Reflexes are normal and symmetric. Reflexes normal.  Psychiatric:        Mood and Affect: Mood normal.           Assessment & Plan:   Problem List Items Addressed This Visit       Cardiovascular and Mediastinum   Essential hypertension   Blood pressure is up today in setting of not getting all of his medication this am  Asked pt to check blood pressure at home when relaxed and back on medication and to message us  with result   Continues benicar  40 mg daily        Endocrine   Type 2 diabetes mellitus with ophthalmic complication (HCC)   Sees endocrinology   U micro due-done today      Relevant Orders   Microalbumin / creatinine urine ratio (Completed)     Other   Spinal stenosis of lumbar region -  Primary   Chronic right sided low back pain with sciatica Waxes and wanes  Worse lately  Would like to see an othopedic provider at Dr Damian office is possible Is open to idea of injections if they could help  L4-L5 spinal stenosis on last MRI in 2024  Reassuring exam without neuro deficits   Referral made to orthopedics  Into re: sciatica rehab to try  May also benefit from orthopedics       Low back pain   Chronic right sided low back pain with sciatica Waxes and wanes  Worse lately  Would like to see an othopedic provider at Dr Damian office is possible Is open to idea of injections if they could help  L4-L5 spinal stenosis on last MRI in 2024  Reassuring exam with no focal neuro findings       Relevant Orders   POCT Urinalysis Dipstick (Automated) (Completed)   Facial injury   From fall 3 wk ago onto concrete Still mild swelling  under left mandible  Slight tenderness over left brow  Overall improved per pt-no bruising remains Reassuring exam  Encouraged use of cold compress if needed  Watch for signs and symptoms of concussion  Follow up if swelling and tenderness do not continue to improve        Other Visit Diagnoses       Need for influenza vaccination       Relevant Orders   Flu vaccine trivalent PF, 6mos and older(Flulaval,Afluria,Fluarix,Fluzone) (Completed)

## 2024-04-23 NOTE — Assessment & Plan Note (Signed)
 From fall 3 wk ago onto concrete Still mild swelling under left mandible  Slight tenderness over left brow  Overall improved per pt-no bruising remains Reassuring exam  Encouraged use of cold compress if needed  Watch for signs and symptoms of concussion  Follow up if swelling and tenderness do not continue to improve

## 2024-04-23 NOTE — Assessment & Plan Note (Signed)
 Chronic right sided low back pain with sciatica Waxes and wanes  Worse lately  Would like to see an othopedic provider at Dr Damian office is possible Is open to idea of injections if they could help  L4-L5 spinal stenosis on last MRI in 2024  Reassuring exam with no focal neuro findings

## 2024-04-23 NOTE — Patient Instructions (Addendum)
 I put the referral in for orthopedics  Please let us  know if you don't hear in 1-2 weeks to set that up (mychart message or call or letter)   For facial pain or swelling- use a cold compress If the swelling worsens or pain returns let us  know  If dizziness/ headache (signs of concussion) let us  know   Urine protein test today   Flu shot today   Check your blood pressure at home when you are relaxed and message us  with readings

## 2024-05-11 ENCOUNTER — Other Ambulatory Visit: Payer: Self-pay | Admitting: Cardiology

## 2024-05-11 ENCOUNTER — Encounter: Payer: Self-pay | Admitting: Internal Medicine

## 2024-05-11 ENCOUNTER — Ambulatory Visit (INDEPENDENT_AMBULATORY_CARE_PROVIDER_SITE_OTHER): Admitting: Internal Medicine

## 2024-05-11 VITALS — BP 120/70 | HR 70 | Ht 69.0 in | Wt 211.8 lb

## 2024-05-11 DIAGNOSIS — E11319 Type 2 diabetes mellitus with unspecified diabetic retinopathy without macular edema: Secondary | ICD-10-CM | POA: Diagnosis not present

## 2024-05-11 DIAGNOSIS — E039 Hypothyroidism, unspecified: Secondary | ICD-10-CM

## 2024-05-11 DIAGNOSIS — E7800 Pure hypercholesterolemia, unspecified: Secondary | ICD-10-CM

## 2024-05-11 DIAGNOSIS — Z794 Long term (current) use of insulin: Secondary | ICD-10-CM

## 2024-05-11 DIAGNOSIS — I10 Essential (primary) hypertension: Secondary | ICD-10-CM

## 2024-05-11 LAB — POCT GLYCOSYLATED HEMOGLOBIN (HGB A1C): Hemoglobin A1C: 7 % — AB (ref 4.0–5.6)

## 2024-05-11 MED ORDER — FREESTYLE LIBRE 3 PLUS SENSOR MISC: 1.0000 | 3 refills | Status: AC

## 2024-05-11 NOTE — Patient Instructions (Addendum)
 Please continue: - Jardiance  25 mg before breakfast - Mounjaro  15 mg weekly - Humalog  (15 min before each meal) 25-30 units for breakfast but 35-40 before dinner - Lantus  30-40 units 2x a day (use the lower dose before your shift)  Please continue levothyroxine  75 mcg daily.  Take the thyroid  hormone every day, with water, at least 30 minutes before breakfast, separated by at least 4 hours from: - acid reflux medications - calcium  - iron - multivitamins   Please get in touch with us  few days before next appt to see if the CGM traces are accesible.  Please return in 3-4 months.

## 2024-05-11 NOTE — Progress Notes (Signed)
 Continue subjective:     Patient ID: Mitchell Palmer, male   DOB: 17-May-1966, 58 y.o.   MRN: [id number]   HPI Mr. Arps is a 58 y.o. man, returning for followup for DM2, dx in 2000, uncontrolled, insulin -dependent, with complications (CKD stage 3, erectile dysfunction, mild DR). Last visit 4 months ago.  Interim history: He continues to have steroid injections for generalized joint pains.   No increased urination, nausea, chest pain.   He continues to work nights, 14 days a month.  He usually works 2 days on 2 days off.  Reviewed HbA1c levels: Lab Results  Component Value Date   HGBA1C 6.7 (A) 12/30/2023   HGBA1C 6.9 (H) 10/29/2023   HGBA1C 6.6 (A) 08/29/2023   HGBA1C 6.9 04/25/2023   HGBA1C 7.3 (A) 11/27/2022   HGBA1C 6.8 (A) 07/25/2022   HGBA1C 7.3 (A) 03/21/2022   HGBA1C 8.3 (A) 11/06/2021   HGBA1C 8.1 (A) 02/22/2021   HGBA1C 8.0 (A) 10/17/2020   HGBA1C 7.8 (A) 05/11/2020   HGBA1C 7.5 (A) 01/07/2020   HGBA1C 7.5 (A) 09/08/2019   HGBA1C 7.1 (A) 04/16/2019   HGBA1C 7.6 (A) 09/03/2018   HGBA1C 8.2 (A) 05/21/2018   HGBA1C 7.4 (A) 01/27/2018   HGBA1C 7.6 10/21/2017   HGBA1C 8.0 05/10/2017   HGBA1C 7.4 11/15/2016  04/25/2023: HbA1c 6.9%  He is on: - Jardiance  25 mg daily - Mounjaro  10 >> 12.5 >> 15 units weekly - Lantus  40-46 units 2x a day >> 30-40 units 2x a day (use the lower dose before starting the shift) >> will switch to Toujeo  - Humalog   (15 min before each meal) 25-30 units for breakfast but 20-25 units before the rest of the meals - sometimes taken after the meal.  Uses 10-15 units for correction.  He was on: - Invokana  - stopped 08/2016 - leg tingling - Cycloset  - started 04/22/2013 >> at 1.6 mg daily (tried 3 tabs >> dizziness >> backed off to 2) - Victoza  1.8 mg daily - Actos 45 mg daily - Januvia - glyburide/metformin  - Avandia. - Bydureon  2 mg weekly - he hated the thick needle  - Trulicity  - Basaglar  - Metformin  - stopped 2023 2/2 fatigue  He is  checking his CBG more than 4 times a day with his freestyle libre 3 CGM - we still could not download his reports today: - am: 90-170 - 2h after b'fast: ? - lunch: 90-170 - 2h after lunch: ? - dinner:100-160 - 2h after dinner:190-240 - bedtime: 150-200  Previously: Last 3 mo: GMI: 7.2% Last week: GMI: 7.2%     Previously:  Lowest: 57 >> 59 >> 70;  He has hypoglycemia awareness in the 80s. Highest 400 (steroids) >> .SABRASABRA  300s >> 300s >> 300.  He works nights: 6 PM to 7:30 AM 3-4 days a week.  Meals: - 5:30-6 pm (at work): subway sandwich >> 2 hot dogs + doritos >> chicken biscuit - snack at 9-10 pm: pistachios or 1/2 sandwich; pizza  >> 12 am: nabs +peanuts, apples - 11 am-12 pm: salad, meat + veggies + rice (leftovers from take out), pizza  + HL. Last lipid panel: Lab Results  Component Value Date   CHOL 129 05/01/2023   HDL 50.50 05/01/2023   LDLCALC 53 05/01/2023   LDLDIRECT 63.0 10/27/2021   TRIG 126.0 05/01/2023   CHOLHDL 3 05/01/2023  On Lipitor 40.  -+ Stage II CKD, last BUN/Cr:  Lab Results  Component Value Date   BUN 17 05/01/2023  CREATININE 1.18 05/01/2023   Lab Results  Component Value Date   MICRALBCREAT Unable to calculate 04/23/2024   MICRALBCREAT NOTE 12/30/2023  On Benicar  40 >> 20 >> 5 mg daily.  - last eye exam was 10/2023: + DR.  Dr. Octavia >> now Dr. [name].  He had laser surgery [facility].  Also, In 07/2018 >> had EMG surgery in his R eye for bleeding. He had cataract sx (Dr. Octavia), another surgery (Dr. [name]) on R eye in 2021.  Getting intraocular injections.  - no numbness or tingling in his legs.  Foot exam -11/07/2023 (Dr. Joya).  He also has a history of HTN, GERD, esophageal stricture, plantar fasciitis, eustachian tube dysfunction - status post ear surgery.  Hypothyroidism: -Started levothyroxine  01/2016 -He was skipping doses in the past and TFTs were fluctuating.  Pt is on levothyroxine  75 mcg daily, taken: - in am - fasting - at  least 30 min from b'fast - no calcium  - no iron - no multivitamins - no PPIs - not on Biotin  Reviewed his TFTs: Lab Results  Component Value Date   TSH 2.30 05/01/2023   TSH 2.66 02/11/2023   TSH 4.19 10/27/2021   TSH 2.47 09/02/2020   TSH 1.85 10/20/2019   TSH 2.95 10/21/2018   TSH 5.18 (H) 09/18/2018   TSH 4.25 10/31/2017   TSH 1.17 09/21/2016   TSH 3.05 04/24/2016   In 07/2018, he had C spine surgery.  Review of Systems + see HPI  I reviewed pt's medications, allergies, PMH, social hx, family hx, and changes were documented in the history of present illness. Otherwise, unchanged from my initial visit note.  Past Medical History:  Diagnosis Date   Allergy    allegic rhinitis   Diabetes mellitus    type II   Esophageal stricture    GERD (gastroesophageal reflux disease)    History of hernia repair    as a baby   Hyperlipidemia    Hyperplastic polyp of intestine 2010   Hypertension    OSA (obstructive sleep apnea) 04/18/2022   Proliferative diabetic retinopathy of left eye (HCC) 03/31/2020   This is active as a patient still has tufts of the anterior segment neovascularization, rubeosis in the left eye every region of capillary nonperfusion should be treated with PRP.   S/P ear surgery, follow-up exam    for drainage   Vitreous hemorrhage of right eye (HCC) 12/24/2019   Past Surgical History:  Procedure Laterality Date   BIOPSY  12/07/2021   Procedure: BIOPSY;  Surgeon: Aneita Gwendlyn DASEN, MD;  Location: [facility];  Service: Gastroenterology;;   CATARACT EXTRACTION     COLONOSCOPY  03/07/2016   COLONOSCOPY WITH PROPOFOL  N/A 12/07/2021   Procedure: COLONOSCOPY WITH PROPOFOL ;  Surgeon: Aneita Gwendlyn DASEN, MD;  Location: [facility];  Service: Gastroenterology;  Laterality: N/A;   ESOPHAGOGASTRODUODENOSCOPY  04/20/2009   stricture/GERD   ESOPHAGOGASTRODUODENOSCOPY (EGD) WITH PROPOFOL  N/A 12/07/2021   Procedure: ESOPHAGOGASTRODUODENOSCOPY (EGD) WITH PROPOFOL ;   Surgeon: Aneita Gwendlyn DASEN, MD;  Location: [facility];  Service: Gastroenterology;  Laterality: N/A;   EYE SURGERY     HERNIA REPAIR     age 41-midline   INNER EAR SURGERY  08/20/1986   went in behind rt ear-blockage   KNEE ARTHROSCOPY Right 2023   NECK SURGERY     [facility]   POLYPECTOMY     SAVORY DILATION N/A 12/07/2021   Procedure: SAVORY DILATION;  Surgeon: Aneita Gwendlyn DASEN, MD;  Location: [facility];  Service:  Gastroenterology;  Laterality: N/A;   SHOULDER ARTHROSCOPY WITH ROTATOR CUFF REPAIR AND SUBACROMIAL DECOMPRESSION Right 09/29/2012   Procedure: SHOULDER ARTHROSCOPY WITH ROTATOR CUFF REPAIR AND SUBACROMIAL DECOMPRESSION;  Surgeon: Eva Elsie Herring, MD;  Location: [facility];  Service: Orthopedics;  Laterality: Right;  Right shoulder arthroscopy with subacromial decompression and distal clavicle excision & Debridement of Labrial Tear   SHOULDER SURGERY Left    x2   UPPER GASTROINTESTINAL ENDOSCOPY     Social History   Socioeconomic History   Marital status: Single    Spouse name: Not on file   Number of children: Not on file   Years of education: Not on file   Highest education level: Not on file  Occupational History   Occupation: Designer, television/film set  Tobacco Use   Smoking status: Never   Smokeless tobacco: Never  Vaping Use   Vaping status: Never Used  Substance and Sexual Activity   Alcohol use: Yes    Alcohol/week: 12.0 standard drinks of alcohol    Types: 12 Cans of beer per week   Drug use: No   Sexual activity: Not on file  Other Topics Concern   Not on file  Social History Narrative   Not on file   Social Drivers of Health   Financial Resource Strain: Not on file  Food Insecurity: Not on file  Transportation Needs: Not on file  Physical Activity: Not on file  Stress: Not on file  Social Connections: Not on file  Intimate Partner Violence: Not on file   Current Outpatient Medications on File Prior to Visit  Medication Sig  Dispense Refill   BD PEN NEEDLE MINI ULTRAFINE 31G X 5 MM MISC USE AS DIRECTED 4 TIMES A DAY 100 each 3   cetirizine (ZYRTEC) 10 MG tablet Take 10 mg by mouth daily.     Cholecalciferol (VITAMIN D ) 50 MCG (2000 UT) tablet Take 2,000 Units by mouth daily.     Continuous Glucose Sensor (FREESTYLE LIBRE 3 SENSOR) MISC APPLY 1 SENSOR EVERY 14 DAYS 6 each 3   cyclobenzaprine  (FLEXERIL ) 10 MG tablet Take 1 tablet (10 mg total) by mouth 3 (three) times daily as needed. 30 tablet 3   fluticasone  (FLONASE ) 50 MCG/ACT nasal spray PLACE 2 SPRAYS INTO BOTH NOSTRILS DAILY AS NEEDED FOR ALLERGIES OR RHINITIS. 16 mL 2   insulin  glargine, 1 Unit Dial , (TOUJEO  SOLOSTAR) 300 UNIT/ML Solostar Pen INJECT 42 UNITS INTO THE SKIN EVERY MORNING AND 50 UNITS INTO THE SKIN EVERY EVENING 30 mL 2   insulin  lispro (HUMALOG ) 100 UNIT/ML KwikPen INJECT 25-30 UNITS UNDER THE SKIN THREE TIMES A DAY WITH MEALS AND 10 TO 12 UNITS UNDER THE SKIN WITH SNACKS 60 mL 3   JARDIANCE  25 MG TABS tablet TAKE 1 TABLET BY MOUTH EVERY DAY BEFORE BREAKFAST 90 tablet 3   levothyroxine  (SYNTHROID ) 75 MCG tablet TAKE 1 TABLET BY MOUTH DAILY BEFORE BREAKFAST 30 tablet 2   neomycin -polymyxin-hydrocortisone  (CORTISPORIN) OTIC solution Place 3 drops into the right ear in the morning, at noon, and at bedtime. 10 mL 0   olmesartan  (BENICAR ) 5 MG tablet TAKE 1 TABLET (5 MG TOTAL) BY MOUTH DAILY. 30 tablet 0   omeprazole  (PRILOSEC) 40 MG capsule TAKE 1 CAPSULE BY MOUTH EVERY DAY 30 capsule 10   ONETOUCH ULTRA test strip USE TO TEST BLOOD SUGAR 4 TIMES DAILY AS DIRECTED 400 strip 11   rosuvastatin  (CRESTOR ) 10 MG tablet TAKE 1 TABLET BY MOUTH EVERY DAY 90 tablet 1  tadalafil  (CIALIS ) 20 MG tablet TAKE 1 TABLET BY MOUTH EVERY DAY AS NEEDED FOR ERECTILE DYSFUNCTION 4 tablet 5   tirzepatide  (MOUNJARO ) 15 MG/0.5ML Pen INJECT 15 MG UNDER THE SKIN ONCE WEEKLY 2 mL 3   vitamin B-12 (CYANOCOBALAMIN ) 500 MCG tablet Take 500 mcg by mouth daily.     No current  facility-administered medications on file prior to visit.   Allergies  Allergen Reactions   Codeine     Anxious   Lipitor [Atorvastatin ]     Muscle pain    Family History  Problem Relation Age of Onset   Diabetes Mother    Colon cancer Father 71   Diabetes Other    Colon polyps Brother    Diabetes Maternal Aunt    Cancer Cousin        breast cancer     Objective:   Physical Exam BP 120/70   Pulse 70   Ht 5' 9 (1.753 m)   Wt 211 lb 12.8 oz (96.1 kg)   SpO2 97%   BMI 31.28 kg/m     Wt Readings from Last 3 Encounters:  05/11/24 211 lb 12.8 oz (96.1 kg)  04/23/24 212 lb 4 oz (96.3 kg)  12/30/23 219 lb 6.4 oz (99.5 kg)   Constitutional: overweight, in NAD Eyes: no exophthalmos ENT: no masses palpated in neck, no cervical lymphadenopathy Cardiovascular: RRR, No MRG Respiratory: CTA B Musculoskeletal: no deformities Skin: no rashes Neurological: no tremor with outstretched hands  Assessment:     1. 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 HL  3. Hypothyroidism    Plan:     1.  Patient with longstanding, previously uncontrolled type 2 diabetes on a complex medication regimen including SGLT2 inhibitor, weekly GLP-1/GIP receptor agonist and basal-bolus insulin , with improving control at last visit despite working nights and getting steroid injections.  At that time, HbA1c was 6.7%.  Reviewing the CGM trends, sugars were fluctuating mainly within the target range but with a hyperglycemic spike after lunch and then again after dinner.  Sugars after dinner were more consistently elevated as he was not taking Humalog  before his first meal at work.  He was taking it after the meal.  I advised him to always try to take Humalog  15 minutes before meals.  We discussed about the lag time between Humalog  injection and meals and how to change  this based on the [place] blood sugars.  We also discussed about improving diet and stopping shortcake. -At today's visit, we still could not download his CGM report and was not able to send the trends from his phone.  We sent a prescription for the freestyle libre 3+ CGM and help him download the latest [employer] app.  I am hoping that we can download this at next visit.  For now, reviewing the CGM report on his phone, it appears that the sugars are fairly well-controlled until his evening meal, when they increase abruptly.  He is taking 25 to 30 units of Humalog  before this meal and he waits 15 to 20 minutes before eating.  We discussed about increasing the dose of Humalog  before this meal, but I also advised him to try to work on improving the meal.  We can continue the rest of the regimen for now. - I advised him to: Patient Instructions  Please continue: - Jardiance  25 mg before breakfast -  Mounjaro  15 mg weekly - Humalog  (15 min before each meal) 25-30 units for breakfast but 35-40 before dinner - Lantus  30-40 units 2x a day (use the lower dose before your shift)  Please continue levothyroxine  75 mcg daily.  Take the thyroid  hormone every day, with water, at least 30 minutes before breakfast, separated by at least 4 hours from: - acid reflux medications - calcium  - iron - multivitamins   Please get in touch with us  few days before next appt to see if the CGM traces are accesible.  Please return in 3-4 months.  - we checked his HbA1c: 7.0% (higher) - advised to check sugars at different times of the day - 4x a day, rotating check times - advised for yearly eye exams >> he is UTD - return to clinic in 3-4 months  2. HL  - Latest lipid fraction was reviewed from 04/2023: All fractions at goal: Lab Results  Component Value Date   CHOL 129 05/01/2023   HDL 50.50 05/01/2023   LDLCALC 53 05/01/2023   LDLDIRECT 63.0 10/27/2021   TRIG 126.0 05/01/2023   CHOLHDL 3 05/01/2023  -He  continues on Lipitor 40 mg daily without side effects -will have an annual physical exam with PCP soon  3. Hypothyroidism - latest thyroid  labs reviewed with pt. >> normal: Lab Results  Component Value Date   TSH 2.30 05/01/2023  - he continues on LT4 75 mcg daily - pt feels good on this dose. - we discussed about taking the thyroid  hormone every day, with water, >30 minutes before breakfast, separated by >4 hours from acid reflux medications, calcium , iron, multivitamins. Pt. is taking it correctly. - he would want to wait until the annual physical exam with PCP to have a TSH checked  Lela Fendt, MD PhD [facility]

## 2024-05-12 ENCOUNTER — Encounter: Payer: Self-pay | Admitting: Internal Medicine

## 2024-05-13 ENCOUNTER — Ambulatory Visit: Admitting: Physical Medicine and Rehabilitation

## 2024-05-13 ENCOUNTER — Encounter: Payer: Self-pay | Admitting: Physical Medicine and Rehabilitation

## 2024-05-13 VITALS — BP 130/77 | HR 85

## 2024-05-13 DIAGNOSIS — G8929 Other chronic pain: Secondary | ICD-10-CM | POA: Diagnosis not present

## 2024-05-13 DIAGNOSIS — M5441 Lumbago with sciatica, right side: Secondary | ICD-10-CM | POA: Diagnosis not present

## 2024-05-13 DIAGNOSIS — M5416 Radiculopathy, lumbar region: Secondary | ICD-10-CM | POA: Diagnosis not present

## 2024-05-13 DIAGNOSIS — R202 Paresthesia of skin: Secondary | ICD-10-CM | POA: Diagnosis not present

## 2024-05-13 NOTE — Progress Notes (Unsigned)
 Pain Scale   Average Pain 2   MRI review from 2024. He has LBP that radiated to both legs, worse on the right side. Pain can jump to 8/10.    +Driver, -BT, -Dye Allergies.

## 2024-05-13 NOTE — Progress Notes (Unsigned)
 Mitchell Palmer - 58 y.o. male MRN [id number]  Date of birth: 1966-03-03  Office Visit Note: Visit Date: 05/13/2024 PCP: Randeen Laine LABOR, MD Referred by: Tower, Laine LABOR, MD  Subjective: Chief Complaint  Patient presents with   Lower Back - Pain   Right Leg - Pain   Left Leg - Pain   HPI: Mitchell Palmer is a 58 y.o. male who comes in today per the request of Dr. Laine Randeen for evaluation of chronic, worsening and severe right sided lower back pain radiating down right leg. Also reports numbness/tingling to right leg. His pain started in 1999 after unloading desk off of truck. He is previous patient of Dr. Oneil Priestly. His pain worsens with prolonged standing and walking. He describes pain as sore, tight and numb sensation, currently rates as 8 out of 10. Some relief of pain with home exercise regimen, rest and use of medications. History of chiropractic treatments with significant relief of pain. Lumbar MRI imaging from 2024 shows moderate to severe spinal canal stenosis at L2-L3 and L3-L4. Multi level facet arthropathy. There is right paracentral disc extrusion with 5 mm cranial migration at L5-S1. Clumping of the nerve roots at L4-L5 through L5-S1, which can be seen in the setting of arachnoiditis. No history of lumbar surgery/injections. He was planning to undergo lumbar epidural steroid injection with Dr. Cesario, however his office never called back to schedule procedure. Patient denies focal weakness. No recent trauma or falls.   He does have history of prior ACDF at [facility] by Dr. Priestly.   He was told that he has diabetic polyneuropathy. He was previously evaluated by Dr. Modena Callander with [facility]. He was previously taking Gabapentin , was unable to tolerate due to anger issues/suicidal thoughts.  Patients course is complicated by diabetes mellitus, neuropathy and alcohol abuse.      Review of Systems  Musculoskeletal:  Positive for back pain and myalgias.   Neurological:  Positive for tingling and sensory change. Negative for focal weakness and weakness.  All other systems reviewed and are negative.  Otherwise per HPI.  Assessment & Plan: Visit Diagnoses:    ICD-10-CM   1. Chronic right-sided low back pain with right-sided sciatica  M54.41 Ambulatory referral to Physical Medicine Rehab   G89.29     2. Lumbar radiculopathy  M54.16 Ambulatory referral to Physical Medicine Rehab    3. Paresthesia of skin  R20.2 Ambulatory referral to Physical Medicine Rehab       Plan: Findings:  Chronic, worsening and severe right sided lower back pain radiating down right leg. Patient continues to have severe pain despite good conservative therapies such as chiropractic treatments, home exercise regimen, rest and use of medications. His pain does radiate down right leg, more of L5 pattern, but does affect entire extremity. Does seem to be most consistent with neurogenic claudication as a result of spinal canal stenosis.  There is moderate to severe central canal stenosis noted at L2-L3 and L3-L4.  I do think the paresthesias to bilateral lower extremities are consistent with diabetic polyneuropathy.  We discussed treatment plan in detail today.  Next step is to perform diagnostic and hopefully therapeutic right L4-L5 interlaminar epidural steroid injection under fluoroscopic guidance.  If good relief of pain with injection we can repeat this procedure infrequently as needed.  I discussed injection procedure with him in detail today, he has no questions at this time.  Should his pain persist would consider surgical evaluation with Dr. Ozell Ada.  Would recommend patient follow-up with Dr.Yan for further management of neuropathy.  No red flag symptoms noted upon exam today.    Meds & Orders: No orders of the defined types were placed in this encounter.   Orders Placed This Encounter  Procedures   Ambulatory referral to Physical Medicine Rehab    Follow-up:  Return for Right L4-L5 interlaminar epidural steroid injection.   Procedures: No procedures performed      Clinical History: CLINICAL DATA:  Chronic low back pain for 20 years   EXAM: MRI LUMBAR SPINE WITHOUT CONTRAST   TECHNIQUE: Multiplanar, multisequence MR imaging of the lumbar spine was performed. No intravenous contrast was administered.   COMPARISON:  05/25/2018   FINDINGS: Segmentation: In keeping with the prior exam, there are 5 lumbar type vertebral bodies.   Alignment: Trace retrolisthesis of L1 on L2, L2 on L3, and L3 on L4, which have progressed slightly since the prior exam. There is now trace retrolisthesis of L4 on L5. unchanged trace anterolisthesis of L5 on S1. Dextrocurvature of the thoracolumbar junction and upper lumbar spine, with compensatory levocurvature lower lumbar spine.   Vertebrae: No acute fracture or suspicious osseous lesion. Endplate degenerative changes, eccentric to the left L2-L3 and eccentric to the right at L4-L5.   Conus medullaris and cauda equina: Conus extends to the L1 level. Possible mild clumping of the nerve roots at L4-L5 through L5-S1. Conus and cauda equina appear normal.   Paraspinal and other soft tissues: Negative.   Disc levels:   T11-T12: Seen only on the sagittal images. Small left subarticular disc protrusion. No spinal canal stenosis or neural foraminal narrowing.   T12-L1: Minimal disc bulge. No spinal canal stenosis or neural foraminal narrowing.   L1-L2: Trace retrolisthesis and mild disc bulge. Mild facet arthropathy. No spinal canal stenosis or neural foraminal narrowing.   L2-L3: Trace retrolisthesis with mild disc bulge and superimposed left paracentral disc protrusion. Mild facet arthropathy. Ligamentum flavum hypertrophy. Effacement of the left lateral recess and narrowing of the right lateral recess. Moderate to severe spinal canal stenosis and mild bilateral neural foraminal  narrowing, unchanged.   L3-L4: Trace retrolisthesis and moderate disc bulge with superimposed central disc protrusion. Effacement of the left greater than right lateral recess. Moderate facet arthropathy. Ligamentum flavum hypertrophy. Moderate to severe spinal canal stenosis and mild left greater than right neural foraminal narrowing, unchanged.   L4-L5: Redemonstrated dysplastic left-sided posterior elements, with largely conjoined left L4 and L5 neural foramina, which are widely patent. Mild disc bulge with central disc protrusion. Right greater than left facet arthropathy. Narrowing of the right lateral recess. No spinal canal stenosis. Severe right neural foraminal narrowing, which has progressed from the prior exam.   L5-S1: Trace anterolisthesis with disc unroofing and right paracentral disc extrusion with 5 mm cranial migration. Mild facet arthropathy. No spinal canal stenosis. Severe right neural foraminal narrowing, which has progressed from the prior exam   IMPRESSION: 1. L2-L3 and L3-L4 moderate to severe spinal canal stenosis and mild bilateral neural foraminal narrowing, unchanged. 2. L4-L5 and L5-S1 severe right neural foraminal narrowing, which has progressed from the prior exam. Narrowing of the right lateral recess at L4-L5 could affect the descending right L5 nerve roots. 3. Clumping of the nerve roots at L4-L5 through L5-S1, which can be seen in the setting of arachnoiditis.     Electronically Signed   By: Donald Campion M.D.   On: 11/15/2022 14:26   He reports that he has never smoked. He  has never used smokeless tobacco.  Recent Labs    10/29/23 1215 12/30/23 1011 05/11/24 0940  HGBA1C 6.9* 6.7* 7.0*    Objective:  VS:  HT:    WT:   BMI:     BP:130/77  HR:85bpm  TEMP: ( )  RESP:  Physical Exam Vitals and nursing note reviewed.  HENT:     Head: Normocephalic and atraumatic.     Right Ear: External ear normal.     Left Ear: External ear  normal.     Nose: Nose normal.     Mouth/Throat:     Mouth: Mucous membranes are moist.  Eyes:     Extraocular Movements: Extraocular movements intact.  Cardiovascular:     Rate and Rhythm: Normal rate.     Pulses: Normal pulses.  Pulmonary:     Effort: Pulmonary effort is normal.  Abdominal:     General: Abdomen is flat. There is no distension.  Musculoskeletal:        General: Tenderness present.     Cervical back: Normal range of motion.     Comments: Patient rises from seated position to standing without difficulty. Good lumbar range of motion. No pain noted with facet loading. 5/5 strength noted with bilateral hip flexion, knee flexion/extension, ankle dorsiflexion/plantarflexion and EHL. No clonus noted bilaterally. No pain upon palpation of greater trochanters. No pain with internal/external rotation of bilateral hips. Sensation intact bilaterally. Dysesthesias noted to right L5 dermatome. Positive slump test on the right. Ambulates without aid, gait steady.     Skin:    General: Skin is warm and dry.     Capillary Refill: Capillary refill takes less than 2 seconds.  Neurological:     General: No focal deficit present.     Mental Status: He is alert and oriented to person, place, and time.  Psychiatric:        Mood and Affect: Mood normal.        Behavior: Behavior normal.     Ortho Exam  Imaging: No results found.  Past Medical/Family/Surgical/Social History: Medications & Allergies reviewed per EMR, new medications updated. Patient Active Problem List   Diagnosis Date Noted   Facial injury 04/23/2024   Otitis media, right 12/17/2023   Sore throat 12/17/2023   Spinal stenosis of lumbar region 10/29/2023   Diabetic neuropathy (HCC) 10/29/2023   Family history of prostate cancer 08/28/2023   Peripheral neuropathy 02/08/2023   Heel pain, bilateral 02/08/2023   Dyspnea on exertion 07/30/2022   OSA (obstructive sleep apnea) 04/18/2022   Loud snoring 12/12/2021    Family history of colon cancer    Lump on finger 11/06/2021   Pseudophakia, left eye 10/26/2021   Pseudophakia, right eye 10/26/2021   Vitreomacular adhesion of left eye 09/21/2021   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/2021   Vitreous hemorrhage, left eye (HCC) 06/07/2021   Proliferative diabetic retinopathy of left eye with macular edema associated with type 2 diabetes mellitus (HCC) 06/07/2021   Localized swelling, mass and lump, upper limb 05/31/2021   Right ear pain 04/12/2021   Low vitamin D  level 09/14/2020   Prostate cancer screening 09/02/2020   Current use of proton pump inhibitor 09/02/2020   Family history of factor V Leiden mutation 05/30/2020   Left ankle pain 01/29/2020   Obesity (BMI 30-39.9) 12/24/2019   Proliferative diabetic retinopathy of left eye determined by examination (HCC) 12/24/2019   Nuclear sclerotic cataract of left eye 12/24/2019   Low testosterone  11/13/2019   Fatigue 10/20/2019  Neck pain 04/28/2018   Need for hepatitis B screening test 03/26/2018   Need for hepatitis C screening test 03/26/2018   Paresthesia of both feet 11/01/2017   Hemorrhoids 10/14/2017   Class 2 obesity due to excess calories with body mass index (BMI) of 35.0 to 35.9 in adult 01/24/2016   Hypothyroidism 01/24/2016   Type 2 diabetes mellitus with ophthalmic complication (HCC) 07/28/2015   Alcohol consumption heavy 12/06/2014   Routine general medical examination at a health care facility 09/30/2013   Low back pain 06/17/2012   PLANTAR FASCIITIS, TRAUMATIC 11/09/2009   NEOPLASM UNCERTAIN BEHAVIOR OTHER SPEC SITES 10/07/2009   ESOPHAGEAL STRICTURE 06/27/2009   GERD 03/31/2009   Hyperlipidemia associated with type 2 diabetes mellitus (HCC) 01/06/2009   Essential hypertension 01/06/2009   Allergic rhinitis 01/06/2009   Past Medical History:  Diagnosis Date   Allergy    allegic rhinitis   Diabetes mellitus    type II   Esophageal stricture    GERD (gastroesophageal reflux  disease)    History of hernia repair    as a baby   Hyperlipidemia    Hyperplastic polyp of intestine 2010   Hypertension    OSA (obstructive sleep apnea) 04/18/2022   Proliferative diabetic retinopathy of left eye (HCC) 03/31/2020   This is active as a patient still has tufts of the anterior segment neovascularization, rubeosis in the left eye every region of capillary nonperfusion should be treated with PRP.   S/P ear surgery, follow-up exam    for drainage   Vitreous hemorrhage of right eye (HCC) 12/24/2019   Family History  Problem Relation Age of Onset   Diabetes Mother    Colon cancer Father 27   Diabetes Other    Colon polyps Brother    Diabetes Maternal Aunt    Cancer Cousin        breast cancer   Past Surgical History:  Procedure Laterality Date   BIOPSY  12/07/2021   Procedure: BIOPSY;  Surgeon: Aneita Gwendlyn DASEN, MD;  Location: [facility];  Service: Gastroenterology;;   CATARACT EXTRACTION     COLONOSCOPY  03/07/2016   COLONOSCOPY WITH PROPOFOL  N/A 12/07/2021   Procedure: COLONOSCOPY WITH PROPOFOL ;  Surgeon: Aneita Gwendlyn DASEN, MD;  Location: [facility];  Service: Gastroenterology;  Laterality: N/A;   ESOPHAGOGASTRODUODENOSCOPY  04/20/2009   stricture/GERD   ESOPHAGOGASTRODUODENOSCOPY (EGD) WITH PROPOFOL  N/A 12/07/2021   Procedure: ESOPHAGOGASTRODUODENOSCOPY (EGD) WITH PROPOFOL ;  Surgeon: Aneita Gwendlyn DASEN, MD;  Location: [facility];  Service: Gastroenterology;  Laterality: N/A;   EYE SURGERY     HERNIA REPAIR     age 48-midline   INNER EAR SURGERY  08/20/1986   went in behind rt ear-blockage   KNEE ARTHROSCOPY Right 2023   NECK SURGERY     [facility]   POLYPECTOMY     SAVORY DILATION N/A 12/07/2021   Procedure: SAVORY DILATION;  Surgeon: Aneita Gwendlyn DASEN, MD;  Location: [facility];  Service: Gastroenterology;  Laterality: N/A;   SHOULDER ARTHROSCOPY WITH ROTATOR CUFF REPAIR AND SUBACROMIAL DECOMPRESSION Right 09/29/2012   Procedure: SHOULDER ARTHROSCOPY  WITH ROTATOR CUFF REPAIR AND SUBACROMIAL DECOMPRESSION;  Surgeon: Eva Elsie Herring, MD;  Location: [facility];  Service: Orthopedics;  Laterality: Right;  Right shoulder arthroscopy with subacromial decompression and distal clavicle excision & Debridement of Labrial Tear   SHOULDER SURGERY Left    x2   UPPER GASTROINTESTINAL ENDOSCOPY     Social History   Occupational History   Occupation: Designer, television/film set  Tobacco  Use   Smoking status: Never   Smokeless tobacco: Never  Vaping Use   Vaping status: Never Used  Substance and Sexual Activity   Alcohol use: Yes    Alcohol/week: 12.0 standard drinks of alcohol    Types: 12 Cans of beer per week   Drug use: No   Sexual activity: Not on file

## 2024-05-18 ENCOUNTER — Encounter: Payer: Self-pay | Admitting: Family Medicine

## 2024-05-18 ENCOUNTER — Ambulatory Visit: Admitting: Orthopedic Surgery

## 2024-05-18 ENCOUNTER — Telehealth: Payer: Self-pay | Admitting: Family Medicine

## 2024-05-18 DIAGNOSIS — M205X1 Other deformities of toe(s) (acquired), right foot: Secondary | ICD-10-CM

## 2024-05-18 DIAGNOSIS — Z79899 Other long term (current) drug therapy: Secondary | ICD-10-CM

## 2024-05-18 DIAGNOSIS — M6701 Short Achilles tendon (acquired), right ankle: Secondary | ICD-10-CM | POA: Diagnosis not present

## 2024-05-18 DIAGNOSIS — E039 Hypothyroidism, unspecified: Secondary | ICD-10-CM

## 2024-05-18 DIAGNOSIS — Z8042 Family history of malignant neoplasm of prostate: Secondary | ICD-10-CM

## 2024-05-18 DIAGNOSIS — E1169 Type 2 diabetes mellitus with other specified complication: Secondary | ICD-10-CM

## 2024-05-18 DIAGNOSIS — I10 Essential (primary) hypertension: Secondary | ICD-10-CM

## 2024-05-18 DIAGNOSIS — Z125 Encounter for screening for malignant neoplasm of prostate: Secondary | ICD-10-CM

## 2024-05-18 DIAGNOSIS — R7989 Other specified abnormal findings of blood chemistry: Secondary | ICD-10-CM

## 2024-05-18 NOTE — Telephone Encounter (Signed)
-----   Message from Harlene Du sent at 05/18/2024  3:27 PM EDT ----- Regarding: Lab Wed 05/20/24 Hello,  Patient is coming in for CPE labs on Wednesday 05/20/24. Can we get orders please.   Thanks

## 2024-05-18 NOTE — Telephone Encounter (Signed)
 Please schedule fasting labs asap so results may be back before CPE

## 2024-05-19 ENCOUNTER — Other Ambulatory Visit: Payer: Self-pay | Admitting: Family Medicine

## 2024-05-19 ENCOUNTER — Encounter: Payer: Self-pay | Admitting: Orthopedic Surgery

## 2024-05-19 NOTE — Progress Notes (Signed)
 Office Visit Note   Patient: Mitchell Palmer           Date of Birth: 30-Aug-1965           MRN: [id number] Visit Date: 05/18/2024              Requested by: Tower, Laine LABOR, MD [address] [place],  [place] [address] PCP: Randeen, Laine LABOR, MD  Chief Complaint  Patient presents with   Right Foot - Pain      HPI: Discussed the use of AI scribe software for clinical note transcription with the patient, who gave verbal consent to proceed.  History of Present Illness Mitchell Palmer is a 58 year old male with type 2 diabetes and controlled hypertension who presents with right foot pain.  He experiences pain in the right foot, specifically in the Achilles region, with a pain scale rating of 2-4. The pain can occur suddenly and is associated with tightness in the Achilles tendon, which decreases his range of motion and causes stress on the Achilles and plantar fascia, leading to pain. He performs stretching exercises when the pain is significant.  He had x-rays done about a year ago, which showed a small spur on the bottom of the foot. He believes the spur might be contributing to the pain.  He has a history of type 2 diabetes with a recent A1c of 7.0 and controlled hypertension managed with medication, with recent blood pressure readings around 127/27. His cholesterol is reportedly under control. He mentions a previous evaluation where one of the four arteries in his neck had a little resistance, but otherwise, his blood flow was reported as good.     Assessment & Plan: Visit Diagnoses: No diagnosis found.  Plan: Assessment and Plan Assessment & Plan Right Achilles tendon contracture Significant contracture with dorsiflexion twenty degrees short of neutral, likely due to diabetes-related collagen stiffening. Pain rated 2-4/10, intermittent. - Instruct on Achilles stretching exercises three times daily indefinitely. - Advise use of over-the-counter Voltaren  gel three times daily  for pain management. - Avoid steroid injections due to risk of tendon weakening. - Consider surgical procedure to lengthen calf muscle if stretching is ineffective. - Follow up as needed.  Claw toe deformity of right foot Clawing of the toes on the right foot.      Follow-Up Instructions: Return if symptoms worsen or fail to improve.   Ortho Exam  Patient is alert, oriented, no adenopathy, well-dressed, normal affect, normal respiratory effort. Physical Exam CARDIOVASCULAR: Dorsalis pedis and posterior tibial pulses palpable. MUSCULOSKELETAL: Clawing of the toes. Significant Achilles contraction with dorsiflexion 20 degrees short of neutral with knee extended. No palpable defects. SKIN: No redness, cellulitis, or clinical signs of gout.      Imaging: No results found. No images are attached to the encounter.  Labs: Lab Results  Component Value Date   HGBA1C 7.0 (A) 05/11/2024   HGBA1C 6.7 (A) 12/30/2023   HGBA1C 6.9 (H) 10/29/2023   ESRSEDRATE 9 10/29/2023   CRP 14 (H) 10/29/2023   GRAMSTAIN Rare 10/14/2012   GRAMSTAIN WBC present-predominately PMN 10/14/2012   GRAMSTAIN No Squamous Epithelial Cells Seen 10/14/2012   GRAMSTAIN Rare Gram Positive Cocci In Pairs 10/14/2012   LABORGA STAPHYLOCOCCUS AUREUS 10/14/2012     Lab Results  Component Value Date   ALBUMIN 4.0 05/01/2023   ALBUMIN 4.0 02/11/2023   ALBUMIN 4.6 07/23/2022    No results found for: MG Lab Results  Component Value Date  VD25OH 39.16 05/01/2023   VD25OH 29.81 (L) 10/27/2021   VD25OH 34.05 04/12/2021    No results found for: PREALBUMIN    Latest Ref Rng & Units 05/01/2023    9:03 AM 02/11/2023    9:43 AM 07/23/2022    5:17 PM  CBC EXTENDED  WBC 4.0 - 10.5 K/uL 6.8  6.3  7.4   RBC 4.22 - 5.81 Mil/uL 5.06  4.81  4.99   Hemoglobin 13.0 - 17.0 g/dL 82.8  83.8  82.7   HCT 39.0 - 52.0 % 50.3  47.8  47.5   Platelets 150.0 - 400.0 K/uL 236.0  213.0  215   NEUT# 1.4 - 7.7 K/uL 3.9  3.2   4.8   Lymph# 0.7 - 4.0 K/uL 1.8  1.9  1.5      There is no height or weight on file to calculate BMI.  Orders:  No orders of the defined types were placed in this encounter.  No orders of the defined types were placed in this encounter.    Procedures: No procedures performed  Clinical Data: No additional findings.  ROS:  All other systems negative, except as noted in the HPI. Review of Systems  Objective: Vital Signs: There were no vitals taken for this visit.  Specialty Comments:  CLINICAL DATA:  Chronic low back pain for 20 years   EXAM: MRI LUMBAR SPINE WITHOUT CONTRAST   TECHNIQUE: Multiplanar, multisequence MR imaging of the lumbar spine was performed. No intravenous contrast was administered.   COMPARISON:  05/25/2018   FINDINGS: Segmentation: In keeping with the prior exam, there are 5 lumbar type vertebral bodies.   Alignment: Trace retrolisthesis of L1 on L2, L2 on L3, and L3 on L4, which have progressed slightly since the prior exam. There is now trace retrolisthesis of L4 on L5. unchanged trace anterolisthesis of L5 on S1. Dextrocurvature of the thoracolumbar junction and upper lumbar spine, with compensatory levocurvature lower lumbar spine.   Vertebrae: No acute fracture or suspicious osseous lesion. Endplate degenerative changes, eccentric to the left L2-L3 and eccentric to the right at L4-L5.   Conus medullaris and cauda equina: Conus extends to the L1 level. Possible mild clumping of the nerve roots at L4-L5 through L5-S1. Conus and cauda equina appear normal.   Paraspinal and other soft tissues: Negative.   Disc levels:   T11-T12: Seen only on the sagittal images. Small left subarticular disc protrusion. No spinal canal stenosis or neural foraminal narrowing.   T12-L1: Minimal disc bulge. No spinal canal stenosis or neural foraminal narrowing.   L1-L2: Trace retrolisthesis and mild disc bulge. Mild facet arthropathy. No spinal  canal stenosis or neural foraminal narrowing.   L2-L3: Trace retrolisthesis with mild disc bulge and superimposed left paracentral disc protrusion. Mild facet arthropathy. Ligamentum flavum hypertrophy. Effacement of the left lateral recess and narrowing of the right lateral recess. Moderate to severe spinal canal stenosis and mild bilateral neural foraminal narrowing, unchanged.   L3-L4: Trace retrolisthesis and moderate disc bulge with superimposed central disc protrusion. Effacement of the left greater than right lateral recess. Moderate facet arthropathy. Ligamentum flavum hypertrophy. Moderate to severe spinal canal stenosis and mild left greater than right neural foraminal narrowing, unchanged.   L4-L5: Redemonstrated dysplastic left-sided posterior elements, with largely conjoined left L4 and L5 neural foramina, which are widely patent. Mild disc bulge with central disc protrusion. Right greater than left facet arthropathy. Narrowing of the right lateral recess. No spinal canal stenosis. Severe right neural foraminal narrowing,  which has progressed from the prior exam.   L5-S1: Trace anterolisthesis with disc unroofing and right paracentral disc extrusion with 5 mm cranial migration. Mild facet arthropathy. No spinal canal stenosis. Severe right neural foraminal narrowing, which has progressed from the prior exam   IMPRESSION: 1. L2-L3 and L3-L4 moderate to severe spinal canal stenosis and mild bilateral neural foraminal narrowing, unchanged. 2. L4-L5 and L5-S1 severe right neural foraminal narrowing, which has progressed from the prior exam. Narrowing of the right lateral recess at L4-L5 could affect the descending right L5 nerve roots. 3. Clumping of the nerve roots at L4-L5 through L5-S1, which can be seen in the setting of arachnoiditis.     Electronically Signed   By: Donald Campion M.D.   On: 11/15/2022 14:26  PMFS History: Patient Active Problem List    Diagnosis Date Noted   Facial injury 04/23/2024   Otitis media, right 12/17/2023   Sore throat 12/17/2023   Spinal stenosis of lumbar region 10/29/2023   Diabetic neuropathy (HCC) 10/29/2023   Family history of prostate cancer 08/28/2023   Peripheral neuropathy 02/08/2023   Heel pain, bilateral 02/08/2023   Dyspnea on exertion 07/30/2022   OSA (obstructive sleep apnea) 04/18/2022   Loud snoring 12/12/2021   Family history of colon cancer    Lump on finger 11/06/2021   Pseudophakia, left eye 10/26/2021   Pseudophakia, right eye 10/26/2021   Vitreomacular adhesion of left eye 09/21/2021   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/2021   Vitreous hemorrhage, left eye (HCC) 06/07/2021   Proliferative diabetic retinopathy of left eye with macular edema associated with type 2 diabetes mellitus (HCC) 06/07/2021   Localized swelling, mass and lump, upper limb 05/31/2021   Right ear pain 04/12/2021   Low vitamin D  level 09/14/2020   Prostate cancer screening 09/02/2020   Current use of proton pump inhibitor 09/02/2020   Family history of factor V Leiden mutation 05/30/2020   Left ankle pain 01/29/2020   Obesity (BMI 30-39.9) 12/24/2019   Proliferative diabetic retinopathy of left eye determined by examination (HCC) 12/24/2019   Nuclear sclerotic cataract of left eye 12/24/2019   Low testosterone  11/13/2019   Fatigue 10/20/2019   Neck pain 04/28/2018   Need for hepatitis B screening test 03/26/2018   Need for hepatitis C screening test 03/26/2018   Paresthesia of both feet 11/01/2017   Hemorrhoids 10/14/2017   Class 2 obesity due to excess calories with body mass index (BMI) of 35.0 to 35.9 in adult 01/24/2016   Hypothyroidism 01/24/2016   Type 2 diabetes mellitus with ophthalmic complication (HCC) 07/28/2015   Alcohol consumption heavy 12/06/2014   Routine general medical examination at a health care facility 09/30/2013   Low back pain 06/17/2012   PLANTAR FASCIITIS, TRAUMATIC 11/09/2009    NEOPLASM UNCERTAIN BEHAVIOR OTHER SPEC SITES 10/07/2009   ESOPHAGEAL STRICTURE 06/27/2009   GERD 03/31/2009   Hyperlipidemia associated with type 2 diabetes mellitus (HCC) 01/06/2009   Essential hypertension 01/06/2009   Allergic rhinitis 01/06/2009   Past Medical History:  Diagnosis Date   Allergy    allegic rhinitis   Diabetes mellitus    type II   Esophageal stricture    GERD (gastroesophageal reflux disease)    History of hernia repair    as a baby   Hyperlipidemia    Hyperplastic polyp of intestine 2010   Hypertension    OSA (obstructive sleep apnea) 04/18/2022   Proliferative diabetic retinopathy of left eye (HCC) 03/31/2020   This is active as  a patient still has tufts of the anterior segment neovascularization, rubeosis in the left eye every region of capillary nonperfusion should be treated with PRP.   S/P ear surgery, follow-up exam    for drainage   Vitreous hemorrhage of right eye (HCC) 12/24/2019    Family History  Problem Relation Age of Onset   Diabetes Mother    Colon cancer Father 31   Diabetes Other    Colon polyps Brother    Diabetes Maternal Aunt    Cancer Cousin        breast cancer    Past Surgical History:  Procedure Laterality Date   BIOPSY  12/07/2021   Procedure: BIOPSY;  Surgeon: Aneita Gwendlyn DASEN, MD;  Location: [facility];  Service: Gastroenterology;;   CATARACT EXTRACTION     COLONOSCOPY  03/07/2016   COLONOSCOPY WITH PROPOFOL  N/A 12/07/2021   Procedure: COLONOSCOPY WITH PROPOFOL ;  Surgeon: Aneita Gwendlyn DASEN, MD;  Location: [facility];  Service: Gastroenterology;  Laterality: N/A;   ESOPHAGOGASTRODUODENOSCOPY  04/20/2009   stricture/GERD   ESOPHAGOGASTRODUODENOSCOPY (EGD) WITH PROPOFOL  N/A 12/07/2021   Procedure: ESOPHAGOGASTRODUODENOSCOPY (EGD) WITH PROPOFOL ;  Surgeon: Aneita Gwendlyn DASEN, MD;  Location: [facility];  Service: Gastroenterology;  Laterality: N/A;   EYE SURGERY     HERNIA REPAIR     age 54-midline   INNER EAR SURGERY   08/20/1986   went in behind rt ear-blockage   KNEE ARTHROSCOPY Right 2023   NECK SURGERY     [facility]   POLYPECTOMY     SAVORY DILATION N/A 12/07/2021   Procedure: SAVORY DILATION;  Surgeon: Aneita Gwendlyn DASEN, MD;  Location: [facility];  Service: Gastroenterology;  Laterality: N/A;   SHOULDER ARTHROSCOPY WITH ROTATOR CUFF REPAIR AND SUBACROMIAL DECOMPRESSION Right 09/29/2012   Procedure: SHOULDER ARTHROSCOPY WITH ROTATOR CUFF REPAIR AND SUBACROMIAL DECOMPRESSION;  Surgeon: Eva Elsie Herring, MD;  Location: [facility];  Service: Orthopedics;  Laterality: Right;  Right shoulder arthroscopy with subacromial decompression and distal clavicle excision & Debridement of Labrial Tear   SHOULDER SURGERY Left    x2   UPPER GASTROINTESTINAL ENDOSCOPY     Social History   Occupational History   Occupation: Designer, television/film set  Tobacco Use   Smoking status: Never   Smokeless tobacco: Never  Vaping Use   Vaping status: Never Used  Substance and Sexual Activity   Alcohol use: Yes    Alcohol/week: 12.0 standard drinks of alcohol    Types: 12 Cans of beer per week   Drug use: No   Sexual activity: Not on file

## 2024-05-20 ENCOUNTER — Other Ambulatory Visit

## 2024-05-20 DIAGNOSIS — E039 Hypothyroidism, unspecified: Secondary | ICD-10-CM | POA: Diagnosis not present

## 2024-05-20 DIAGNOSIS — R7989 Other specified abnormal findings of blood chemistry: Secondary | ICD-10-CM

## 2024-05-20 DIAGNOSIS — E785 Hyperlipidemia, unspecified: Secondary | ICD-10-CM | POA: Diagnosis not present

## 2024-05-20 DIAGNOSIS — Z125 Encounter for screening for malignant neoplasm of prostate: Secondary | ICD-10-CM | POA: Diagnosis not present

## 2024-05-20 DIAGNOSIS — I10 Essential (primary) hypertension: Secondary | ICD-10-CM | POA: Diagnosis not present

## 2024-05-20 DIAGNOSIS — Z79899 Other long term (current) drug therapy: Secondary | ICD-10-CM | POA: Diagnosis not present

## 2024-05-20 DIAGNOSIS — E1169 Type 2 diabetes mellitus with other specified complication: Secondary | ICD-10-CM | POA: Diagnosis not present

## 2024-05-20 LAB — COMPREHENSIVE METABOLIC PANEL WITH GFR
ALT: 20 U/L (ref 0–53)
AST: 19 U/L (ref 0–37)
Albumin: 4.3 g/dL (ref 3.5–5.2)
Alkaline Phosphatase: 78 U/L (ref 39–117)
BUN: 15 mg/dL (ref 6–23)
CO2: 27 meq/L (ref 19–32)
Calcium: 9.4 mg/dL (ref 8.4–10.5)
Chloride: 103 meq/L (ref 96–112)
Creatinine, Ser: 1.09 mg/dL (ref 0.40–1.50)
GFR: 75.5 mL/min (ref >60.00)
Glucose, Bld: 253 mg/dL — ABNORMAL HIGH (ref 70–99)
Potassium: 3.8 meq/L (ref 3.5–5.1)
Sodium: 141 meq/L (ref 135–145)
Total Bilirubin: 0.5 mg/dL (ref 0.2–1.2)
Total Protein: 6.8 g/dL (ref 6.0–8.3)

## 2024-05-20 LAB — LIPID PANEL
Cholesterol: 165 mg/dL (ref 0–200)
HDL: 43.2 mg/dL (ref >39.00)
LDL Cholesterol: 43 mg/dL (ref 0–99)
NonHDL: 121.43
Total CHOL/HDL Ratio: 4
Triglycerides: 390 mg/dL — ABNORMAL HIGH (ref 0.0–149.0)
VLDL: 78 mg/dL — ABNORMAL HIGH (ref 0.0–40.0)

## 2024-05-20 LAB — CBC WITH DIFFERENTIAL/PLATELET
Basophils Absolute: 0 K/uL (ref 0.0–0.1)
Basophils Relative: 0.4 % (ref 0.0–3.0)
Eosinophils Absolute: 0.2 K/uL (ref 0.0–0.7)
Eosinophils Relative: 5.3 % — ABNORMAL HIGH (ref 0.0–5.0)
HCT: 45.9 % (ref 39.0–52.0)
Hemoglobin: 15.7 g/dL (ref 13.0–17.0)
Lymphocytes Relative: 35.5 % (ref 12.0–46.0)
Lymphs Abs: 1.6 K/uL (ref 0.7–4.0)
MCHC: 34.1 g/dL (ref 30.0–36.0)
MCV: 99 fl (ref 78.0–100.0)
Monocytes Absolute: 0.5 K/uL (ref 0.1–1.0)
Monocytes Relative: 11.3 % (ref 3.0–12.0)
Neutro Abs: 2.2 K/uL (ref 1.4–7.7)
Neutrophils Relative %: 47.5 % (ref 43.0–77.0)
Platelets: 252 K/uL (ref 150.0–400.0)
RBC: 4.64 Mil/uL (ref 4.22–5.81)
RDW: 12.8 % (ref 11.5–15.5)
WBC: 4.6 K/uL (ref 4.0–10.5)

## 2024-05-20 LAB — VITAMIN D 25 HYDROXY (VIT D DEFICIENCY, FRACTURES): VITD: 22.37 ng/mL — ABNORMAL LOW (ref 30.00–100.00)

## 2024-05-20 LAB — VITAMIN B12: Vitamin B-12: 405 pg/mL (ref 211–911)

## 2024-05-20 LAB — PSA: PSA: 0.58 ng/mL (ref 0.10–4.00)

## 2024-05-20 LAB — TSH: TSH: 3.04 u[IU]/mL (ref 0.35–5.50)

## 2024-05-21 ENCOUNTER — Ambulatory Visit: Payer: Self-pay | Admitting: Family Medicine

## 2024-05-22 ENCOUNTER — Ambulatory Visit (INDEPENDENT_AMBULATORY_CARE_PROVIDER_SITE_OTHER): Admitting: Family Medicine

## 2024-05-22 ENCOUNTER — Encounter: Payer: Self-pay | Admitting: Family Medicine

## 2024-05-22 VITALS — BP 125/60 | HR 63 | Temp 98.0°F | Ht 68.0 in | Wt 211.5 lb

## 2024-05-22 DIAGNOSIS — Z0000 Encounter for general adult medical examination without abnormal findings: Secondary | ICD-10-CM | POA: Diagnosis not present

## 2024-05-22 DIAGNOSIS — E1169 Type 2 diabetes mellitus with other specified complication: Secondary | ICD-10-CM

## 2024-05-22 DIAGNOSIS — R7989 Other specified abnormal findings of blood chemistry: Secondary | ICD-10-CM

## 2024-05-22 DIAGNOSIS — Z789 Other specified health status: Secondary | ICD-10-CM

## 2024-05-22 DIAGNOSIS — L570 Actinic keratosis: Secondary | ICD-10-CM | POA: Insufficient documentation

## 2024-05-22 DIAGNOSIS — Z125 Encounter for screening for malignant neoplasm of prostate: Secondary | ICD-10-CM

## 2024-05-22 DIAGNOSIS — E11319 Type 2 diabetes mellitus with unspecified diabetic retinopathy without macular edema: Secondary | ICD-10-CM

## 2024-05-22 DIAGNOSIS — E785 Hyperlipidemia, unspecified: Secondary | ICD-10-CM

## 2024-05-22 DIAGNOSIS — E039 Hypothyroidism, unspecified: Secondary | ICD-10-CM

## 2024-05-22 DIAGNOSIS — Z794 Long term (current) use of insulin: Secondary | ICD-10-CM

## 2024-05-22 DIAGNOSIS — Z79899 Other long term (current) drug therapy: Secondary | ICD-10-CM

## 2024-05-22 DIAGNOSIS — I10 Essential (primary) hypertension: Secondary | ICD-10-CM

## 2024-05-22 DIAGNOSIS — E669 Obesity, unspecified: Secondary | ICD-10-CM

## 2024-05-22 NOTE — Assessment & Plan Note (Signed)
 Hypothyroidism  Pt has no clinical changes No change in energy level/ hair or skin/ edema and no tremor Lab Results  Component Value Date   TSH 3.04 05/20/2024    Continues levothyroxine  75 mcg daily from endocrinology

## 2024-05-22 NOTE — Assessment & Plan Note (Signed)
 bp in fair control at this time  BP Readings from Last 1 Encounters:  05/22/24 125/60   No changes needed Most recent labs reviewed  Disc lifstyle change with low sodium diet and exercise  Lower blood pressure with weight loss Down to benicar  5 mg daily

## 2024-05-22 NOTE — Assessment & Plan Note (Signed)
 Reviewed health habits including diet and exercise and skin cancer prevention Reviewed appropriate screening tests for age  Also reviewed health mt list, fam hx and immunization status , as well as social and family history   See HPI Labs reviewed and ordered  Utd flu shot  Utd eye care  Psa stable  Colonoscopy 11/2021- suspect 5 y recall for fam history  Discussed fall prevention, supplements and exercise for bone density  (D level is normal now) Encouraged less etoh intake  PHQ 5 - due to fatigue and chronic back pain

## 2024-05-22 NOTE — Assessment & Plan Note (Signed)
 Several areas of scale on hands susp for AKs Counseled on sun protection  Ref to derm for eval and skin cancer screening

## 2024-05-22 NOTE — Assessment & Plan Note (Signed)
 Lab Results  Component Value Date   PSA 0.58 05/20/2024   PSA 0.61 05/01/2023   PSA 0.66 10/27/2021    Brother with prostate cancer No symptoms

## 2024-05-22 NOTE — Assessment & Plan Note (Signed)
 Lab Results  Component Value Date   VITAMINB12 405 05/20/2024   Last vitamin D  Lab Results  Component Value Date   VD25OH 22.37 (L) 05/20/2024

## 2024-05-22 NOTE — Assessment & Plan Note (Signed)
 Last vitamin D  Lab Results  Component Value Date   VD25OH 22.37 (L) 05/20/2024   Instructed to increase D3 to 4000 international units daily

## 2024-05-22 NOTE — Progress Notes (Signed)
 Subjective:    Patient ID: Mitchell Palmer, male    DOB: 12/27/1965, 58 y.o.   MRN: [id number]  HPI  Here for health maintenance exam and to review chronic medical problems   Wt Readings from Last 3 Encounters:  05/22/24 211 lb 8 oz (95.9 kg)  05/11/24 211 lb 12.8 oz (96.1 kg)  04/23/24 212 lb 4 oz (96.3 kg)   32.16 kg/m  Vitals:   05/22/24 1121 05/22/24 1153  BP: 134/70 125/60  Pulse: 63   Temp: 98 F (36.7 C)   SpO2: 99%     Immunization History  Administered Date(s) Administered   Hep A / Hep B 03/26/2018, 04/28/2018, 09/30/2018   Influenza Split 06/17/2012   Influenza Whole 06/20/2010   Influenza, Seasonal, Injecte, Preservative Fre 04/25/2023, 04/23/2024   Influenza,inj,Quad PF,6+ Mos 06/03/2013, 06/30/2014, 07/22/2015, 05/15/2016, 05/10/2017, 04/28/2018, 05/28/2019, 05/31/2020, 05/24/2021, 05/15/2022   PFIZER(Purple Top)SARS-COV-2 Vaccination 03/16/2020, 04/08/2020   Pneumococcal Polysaccharide-23 10/12/2011, 02/27/2017   Td 08/21/1999, 09/14/2020   Tdap 01/12/2011   Zoster Recombinant(Shingrix ) 05/23/2022, 01/12/2023    Health Maintenance Due  Topic Date Due   Pneumococcal Vaccine: 50+ Years (2 of 2 - PCV) 02/27/2018      Prostate health Lab Results  Component Value Date   PSA 0.58 05/20/2024   PSA 0.61 05/01/2023   PSA 0.66 10/27/2021   Brother with prostate cancer at 65   No problems passing urine     Colon cancer screening  Colonoscopy 11/2021 - 5 y recall  Father had colon cancer   Bone health    Falls- fell with dog (stiff neck)  Fractures-no fractures  Supplements 2000 international units D3 daily  Last vitamin D  Lab Results  Component Value Date   VD25OH 22.37 (L) 05/20/2024    Exercise  Very little  Remodeling house- some Walking more at work  Has gym membership but does not go -not convenient    Etoh intake  0-6 beers per day Trying to decrease that    Mood    05/22/2024   11:26 AM 04/23/2024    9:15 AM 12/27/2023     2:17 PM 12/17/2023   11:58 AM 08/28/2023   11:34 AM  Depression screen PHQ 2/9  Decreased Interest 1 1 0 1 0  Down, Depressed, Hopeless 0 2 0 0 0  PHQ - 2 Score 1 3 0 1 0  Altered sleeping 0 2 0 1 0  Tired, decreased energy 1 2 1 1 1   Change in appetite 3 0 0 0 2  Feeling bad or failure about yourself  0 2 0 0 0  Trouble concentrating 0 0 0 0 0  Moving slowly or fidgety/restless 0 0 0 0 0  Suicidal thoughts 0 0 0 0 0  PHQ-9 Score 5 9 1 3 3   Difficult doing work/chores Somewhat difficult Not difficult at all Not difficult at all Not difficult at all Not difficult at all   HTN bp is stable today  No cp or palpitations or headaches or edema  No side effects to medicines  BP Readings from Last 3 Encounters:  05/22/24 125/60  05/13/24 130/77  05/11/24 120/70    Benicar  5 mg daily  Cut down fro 40 - had hypotension    Lab Results  Component Value Date   NA 141 05/20/2024   K 3.8 05/20/2024   CO2 27 05/20/2024   GLUCOSE 253 (H) 05/20/2024   BUN 15 05/20/2024   CREATININE 1.09 05/20/2024   CALCIUM  9.4 05/20/2024  GFR 75.50 05/20/2024   GFRNONAA >60 07/23/2022   GERD Omeprazole  40 mg daily  Lab Results  Component Value Date   VITAMINB12 405 05/20/2024    Hypothyroidism  Pt has no clinical changes No change in energy level/ hair or skin/ edema and no tremor Lab Results  Component Value Date   TSH 3.04 05/20/2024      Levothyroxine  75 mcg daily from endocriology   DM with neuropathy and retinopathy  Sees endocrinology  Jardiance  Mounjaro   Insulin    Last A1c 7.0   Hyperlipidemia Lab Results  Component Value Date   CHOL 165 05/20/2024   CHOL 129 05/01/2023   CHOL 142 02/11/2023   Lab Results  Component Value Date   HDL 43.20 05/20/2024   HDL 50.50 05/01/2023   HDL 47.30 02/11/2023   Lab Results  Component Value Date   LDLCALC 43 05/20/2024   LDLCALC 53 05/01/2023   LDLCALC 68 02/11/2023   Lab Results  Component Value Date   TRIG 390.0 (H)  05/20/2024   TRIG 126.0 05/01/2023   TRIG 133.0 02/11/2023   Lab Results  Component Value Date   CHOLHDL 4 05/20/2024   CHOLHDL 3 05/01/2023   CHOLHDL 3 02/11/2023   Lab Results  Component Value Date   LDLDIRECT 63.0 10/27/2021   LDLDIRECT 67.0 05/24/2021   LDLDIRECT 65.0 09/02/2020   Crestor  10 mg daily      Patient Active Problem List   Diagnosis Date Noted   Actinic keratoses 05/22/2024   Facial injury 04/23/2024   Spinal stenosis of lumbar region 10/29/2023   Diabetic neuropathy (HCC) 10/29/2023   Family history of prostate cancer 08/28/2023   Peripheral neuropathy 02/08/2023   Heel pain, bilateral 02/08/2023   Dyspnea on exertion 07/30/2022   OSA (obstructive sleep apnea) 04/18/2022   Loud snoring 12/12/2021   Family history of colon cancer    Lump on finger 11/06/2021   Pseudophakia, left eye 10/26/2021   Pseudophakia, right eye 10/26/2021   Vitreomacular adhesion of left eye 09/21/2021   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/2021   Vitreous hemorrhage, left eye (HCC) 06/07/2021   Proliferative diabetic retinopathy of left eye with macular edema associated with type 2 diabetes mellitus (HCC) 06/07/2021   Localized swelling, mass and lump, upper limb 05/31/2021   Right ear pain 04/12/2021   Low vitamin D  level 09/14/2020   Prostate cancer screening 09/02/2020   Current use of proton pump inhibitor 09/02/2020   Family history of factor V Leiden mutation 05/30/2020   Left ankle pain 01/29/2020   Obesity (BMI 30-39.9) 12/24/2019   Proliferative diabetic retinopathy of left eye determined by examination (HCC) 12/24/2019   Nuclear sclerotic cataract of left eye 12/24/2019   Low testosterone  11/13/2019   Fatigue 10/20/2019   Neck pain 04/28/2018   Need for hepatitis B screening test 03/26/2018   Need for hepatitis C screening test 03/26/2018   Paresthesia of both feet 11/01/2017   Hemorrhoids 10/14/2017   Class 2 obesity due to excess calories with body mass index  (BMI) of 35.0 to 35.9 in adult 01/24/2016   Hypothyroidism 01/24/2016   Type 2 diabetes mellitus with ophthalmic complication (HCC) 07/28/2015   Alcohol consumption heavy 12/06/2014   Routine general medical examination at a health care facility 09/30/2013   Low back pain 06/17/2012   PLANTAR FASCIITIS, TRAUMATIC 11/09/2009   NEOPLASM UNCERTAIN BEHAVIOR OTHER SPEC SITES 10/07/2009   ESOPHAGEAL STRICTURE 06/27/2009   GERD 03/31/2009   Hyperlipidemia associated with type 2 diabetes  mellitus (HCC) 01/06/2009   Essential hypertension 01/06/2009   Allergic rhinitis 01/06/2009   Past Medical History:  Diagnosis Date   Allergy    allegic rhinitis   Diabetes mellitus    type II   Esophageal stricture    GERD (gastroesophageal reflux disease)    History of hernia repair    as a baby   Hyperlipidemia    Hyperplastic polyp of intestine 2010   Hypertension    OSA (obstructive sleep apnea) 04/18/2022   Proliferative diabetic retinopathy of left eye (HCC) 03/31/2020   This is active as a patient still has tufts of the anterior segment neovascularization, rubeosis in the left eye every region of capillary nonperfusion should be treated with PRP.   S/P ear surgery, follow-up exam    for drainage   Vitreous hemorrhage of right eye (HCC) 12/24/2019   Past Surgical History:  Procedure Laterality Date   BIOPSY  12/07/2021   Procedure: BIOPSY;  Surgeon: Aneita Gwendlyn DASEN, MD;  Location: [facility];  Service: Gastroenterology;;   CATARACT EXTRACTION     COLONOSCOPY  03/07/2016   COLONOSCOPY WITH PROPOFOL  N/A 12/07/2021   Procedure: COLONOSCOPY WITH PROPOFOL ;  Surgeon: Aneita Gwendlyn DASEN, MD;  Location: [facility];  Service: Gastroenterology;  Laterality: N/A;   ESOPHAGOGASTRODUODENOSCOPY  04/20/2009   stricture/GERD   ESOPHAGOGASTRODUODENOSCOPY (EGD) WITH PROPOFOL  N/A 12/07/2021   Procedure: ESOPHAGOGASTRODUODENOSCOPY (EGD) WITH PROPOFOL ;  Surgeon: Aneita Gwendlyn DASEN, MD;  Location: [facility];   Service: Gastroenterology;  Laterality: N/A;   EYE SURGERY     HERNIA REPAIR     age 37-midline   INNER EAR SURGERY  08/20/1986   went in behind rt ear-blockage   KNEE ARTHROSCOPY Right 2023   NECK SURGERY     [facility]   POLYPECTOMY     SAVORY DILATION N/A 12/07/2021   Procedure: SAVORY DILATION;  Surgeon: Aneita Gwendlyn DASEN, MD;  Location: [facility];  Service: Gastroenterology;  Laterality: N/A;   SHOULDER ARTHROSCOPY WITH ROTATOR CUFF REPAIR AND SUBACROMIAL DECOMPRESSION Right 09/29/2012   Procedure: SHOULDER ARTHROSCOPY WITH ROTATOR CUFF REPAIR AND SUBACROMIAL DECOMPRESSION;  Surgeon: Eva Elsie Herring, MD;  Location: [facility];  Service: Orthopedics;  Laterality: Right;  Right shoulder arthroscopy with subacromial decompression and distal clavicle excision & Debridement of Labrial Tear   SHOULDER SURGERY Left    x2   UPPER GASTROINTESTINAL ENDOSCOPY     Social History   Tobacco Use   Smoking status: Never   Smokeless tobacco: Never  Vaping Use   Vaping status: Never Used  Substance Use Topics   Alcohol use: Yes    Alcohol/week: 12.0 standard drinks of alcohol    Types: 12 Cans of beer per week   Drug use: No   Family History  Problem Relation Age of Onset   Diabetes Mother    Colon cancer Father 57   Diabetes Other    Colon polyps Brother    Diabetes Maternal Aunt    Cancer Cousin        breast cancer   Allergies  Allergen Reactions   Codeine     Anxious   Lipitor [Atorvastatin ]     Muscle pain    Current Outpatient Medications on File Prior to Visit  Medication Sig Dispense Refill   ALPHA LIPOIC ACID PO Take 1 capsule by mouth daily.     BD PEN NEEDLE MINI ULTRAFINE 31G X 5 MM MISC USE AS DIRECTED 4 TIMES A DAY 100 each 3   cetirizine (  ZYRTEC) 10 MG tablet Take 10 mg by mouth daily.     Cholecalciferol (VITAMIN D ) 50 MCG (2000 UT) tablet Take 2,000 Units by mouth daily.     Continuous Glucose Sensor (FREESTYLE LIBRE 3 PLUS  SENSOR) MISC Inject 1 Device into the skin continuous. Change every 15 days 6 each 3   cyclobenzaprine  (FLEXERIL ) 10 MG tablet Take 1 tablet (10 mg total) by mouth 3 (three) times daily as needed. 30 tablet 3   fluticasone  (FLONASE ) 50 MCG/ACT nasal spray PLACE 2 SPRAYS INTO BOTH NOSTRILS DAILY AS NEEDED FOR ALLERGIES OR RHINITIS. 16 mL 2   insulin  glargine, 1 Unit Dial , (TOUJEO  SOLOSTAR) 300 UNIT/ML Solostar Pen INJECT 42 UNITS INTO THE SKIN EVERY MORNING AND 50 UNITS INTO THE SKIN EVERY EVENING 30 mL 2   insulin  lispro (HUMALOG ) 100 UNIT/ML KwikPen INJECT 25-30 UNITS UNDER THE SKIN THREE TIMES A DAY WITH MEALS AND 10 TO 12 UNITS UNDER THE SKIN WITH SNACKS 60 mL 3   JARDIANCE  25 MG TABS tablet TAKE 1 TABLET BY MOUTH EVERY DAY BEFORE BREAKFAST 90 tablet 3   levothyroxine  (SYNTHROID ) 75 MCG tablet TAKE 1 TABLET BY MOUTH DAILY BEFORE BREAKFAST 30 tablet 2   Magnesium 250 MG CAPS Take 1 capsule by mouth daily.     neomycin -polymyxin-hydrocortisone  (CORTISPORIN) OTIC solution Place 3 drops into the right ear in the morning, at noon, and at bedtime. 10 mL 0   olmesartan  (BENICAR ) 5 MG tablet TAKE 1 TABLET (5 MG TOTAL) BY MOUTH DAILY. 15 tablet 0   omeprazole  (PRILOSEC) 40 MG capsule TAKE 1 CAPSULE BY MOUTH EVERY DAY 30 capsule 10   ONETOUCH ULTRA test strip USE TO TEST BLOOD SUGAR 4 TIMES DAILY AS DIRECTED 400 strip 11   rosuvastatin  (CRESTOR ) 10 MG tablet TAKE 1 TABLET BY MOUTH EVERY DAY 90 tablet 1   tadalafil  (CIALIS ) 20 MG tablet TAKE 1 TABLET BY MOUTH EVERY DAY AS NEEDED FOR ERECTILE DYSFUNCTION 4 tablet 5   tirzepatide  (MOUNJARO ) 15 MG/0.5ML Pen INJECT 15 MG UNDER THE SKIN ONCE WEEKLY 2 mL 3   vitamin B-12 (CYANOCOBALAMIN ) 500 MCG tablet Take 500 mcg by mouth daily.     No current facility-administered medications on file prior to visit.    Review of Systems  Constitutional:  Positive for fatigue. Negative for activity change, appetite change, fever and unexpected weight change.  HENT:   Negative for congestion, rhinorrhea, sore throat and trouble swallowing.   Eyes:  Negative for pain, redness, itching and visual disturbance.  Respiratory:  Negative for cough, chest tightness, shortness of breath and wheezing.   Cardiovascular:  Negative for chest pain and palpitations.  Gastrointestinal:  Negative for abdominal pain, blood in stool, constipation, diarrhea and nausea.  Endocrine: Negative for cold intolerance, heat intolerance, polydipsia and polyuria.  Genitourinary:  Negative for difficulty urinating, dysuria, frequency and urgency.  Musculoskeletal:  Positive for back pain. Negative for arthralgias, joint swelling and myalgias.  Skin:  Negative for pallor and rash.  Neurological:  Negative for dizziness, tremors, weakness, numbness and headaches.  Hematological:  Negative for adenopathy. Does not bruise/bleed easily.  Psychiatric/Behavioral:  Negative for decreased concentration and dysphoric mood. The patient is not nervous/anxious.        Objective:   Physical Exam Constitutional:      General: He is not in acute distress.    Appearance: Normal appearance. He is well-developed. He is obese. He is not ill-appearing or diaphoretic.  HENT:     Head: Normocephalic and  atraumatic.     Right Ear: Tympanic membrane, ear canal and external ear normal.     Left Ear: Tympanic membrane, ear canal and external ear normal.     Nose: Nose normal. No congestion.     Mouth/Throat:     Mouth: Mucous membranes are moist.     Pharynx: Oropharynx is clear. No posterior oropharyngeal erythema.  Eyes:     General: No scleral icterus.       Right eye: No discharge.        Left eye: No discharge.     Conjunctiva/sclera: Conjunctivae normal.     Pupils: Pupils are equal, round, and reactive to light.  Neck:     Thyroid : No thyromegaly.     Vascular: No carotid bruit or JVD.  Cardiovascular:     Rate and Rhythm: Normal rate and regular rhythm.     Pulses: Normal pulses.     Heart  sounds: Normal heart sounds.     No gallop.  Pulmonary:     Effort: Pulmonary effort is normal. No respiratory distress.     Breath sounds: Normal breath sounds. No wheezing or rales.     Comments: Good air exch Chest:     Chest wall: No tenderness.  Abdominal:     General: Bowel sounds are normal. There is no distension or abdominal bruit.     Palpations: Abdomen is soft. There is no mass.     Tenderness: There is no abdominal tenderness.     Hernia: No hernia is present.  Musculoskeletal:        General: No tenderness.     Cervical back: Normal range of motion and neck supple. No rigidity. No muscular tenderness.     Right lower leg: No edema.     Left lower leg: No edema.  Lymphadenopathy:     Cervical: No cervical adenopathy.  Skin:    General: Skin is warm and dry.     Coloration: Skin is not pale.     Findings: No erythema or rash.     Comments: Solar lentigines diffusely  Solar aging   Scale areas on dorsal hands/arms susp for AKS  Neurological:     Mental Status: He is alert.     Cranial Nerves: No cranial nerve deficit.     Motor: No abnormal muscle tone.     Coordination: Coordination normal.     Gait: Gait normal.     Deep Tendon Reflexes: Reflexes are normal and symmetric. Reflexes normal.  Psychiatric:        Mood and Affect: Mood normal.        Cognition and Memory: Cognition and memory normal.           Assessment & Plan:   Problem List Items Addressed This Visit       Cardiovascular and Mediastinum   Essential hypertension   bp in fair control at this time  BP Readings from Last 1 Encounters:  05/22/24 125/60   No changes needed Most recent labs reviewed  Disc lifstyle change with low sodium diet and exercise  Lower blood pressure with weight loss Down to benicar  5 mg daily         Endocrine   Type 2 diabetes mellitus with ophthalmic complication (HCC)   Lab Results  Component Value Date   HGBA1C 7.0 (A) 05/11/2024   HGBA1C 6.7 (A)  12/30/2023   HGBA1C 6.9 (H) 10/29/2023   Jardiance , mounjaro  and weight loss, insulin   Under endorinology  care      Hypothyroidism   Hypothyroidism  Pt has no clinical changes No change in energy level/ hair or skin/ edema and no tremor Lab Results  Component Value Date   TSH 3.04 05/20/2024    Continues levothyroxine  75 mcg daily from endocrinology      Hyperlipidemia associated with type 2 diabetes mellitus (HCC)   Disc goals for lipids and reasons to control them Rev last labs with pt Rev low sat fat diet in detail Encouraged exercise to raise HDL LDL controlled  Trig up / may be due to glucose   Continue crestor  10 mg daily         Musculoskeletal and Integument   Actinic keratoses   Several areas of scale on hands susp for AKs Counseled on sun protection  Ref to derm for eval and skin cancer screening       Relevant Orders   Ambulatory referral to Dermatology     Other   Routine general medical examination at a health care facility - Primary   Reviewed health habits including diet and exercise and skin cancer prevention Reviewed appropriate screening tests for age  Also reviewed health mt list, fam hx and immunization status , as well as social and family history   See HPI Labs reviewed and ordered  Utd flu shot  Utd eye care  Psa stable  Colonoscopy 11/2021- suspect 5 y recall for fam history  Discussed fall prevention, supplements and exercise for bone density  (D level is normal now) Encouraged less etoh intake  PHQ 5 - due to fatigue and chronic back pain         Prostate cancer screening   Lab Results  Component Value Date   PSA 0.58 05/20/2024   PSA 0.61 05/01/2023   PSA 0.66 10/27/2021    Brother with prostate cancer No symptoms       Obesity (BMI 30-39.9)   Discussed how this problem influences overall health and the risks it imposes  Reviewed plan for weight loss with lower calorie diet (via better food choices (lower glycemic and  portion control) along with exercise building up to or more than 30 minutes 5 days per week including some aerobic activity and strength training   Improved with glp-1 Encouraged more strength training to prevent muscle loss       Low vitamin D  level   Last vitamin D  Lab Results  Component Value Date   VD25OH 22.37 (L) 05/20/2024   Instructed to increase D3 to 4000 international units daily      Current use of proton pump inhibitor   Lab Results  Component Value Date   VITAMINB12 405 05/20/2024   Last vitamin D  Lab Results  Component Value Date   VD25OH 22.37 (L) 05/20/2024         Alcohol consumption heavy   Continues to work on this  0-6 beers per day  Encouraged to cut back to 2 or less drinks  Lab Results  Component Value Date   ALT 20 05/20/2024   AST 19 05/20/2024   ALKPHOS 78 05/20/2024   BILITOT 0.5 05/20/2024

## 2024-05-22 NOTE — Assessment & Plan Note (Signed)
 Disc goals for lipids and reasons to control them Rev last labs with pt Rev low sat fat diet in detail Encouraged exercise to raise HDL LDL controlled  Trig up / may be due to glucose   Continue crestor  10 mg daily

## 2024-05-22 NOTE — Assessment & Plan Note (Signed)
 Continues to work on this  0-6 beers per day  Encouraged to cut back to 2 or less drinks  Lab Results  Component Value Date   ALT 20 05/20/2024   AST 19 05/20/2024   ALKPHOS 78 05/20/2024   BILITOT 0.5 05/20/2024

## 2024-05-22 NOTE — Assessment & Plan Note (Signed)
 Discussed how this problem influences overall health and the risks it imposes  Reviewed plan for weight loss with lower calorie diet (via better food choices (lower glycemic and portion control) along with exercise building up to or more than 30 minutes 5 days per week including some aerobic activity and strength training   Improved with glp-1 Encouraged more strength training to prevent muscle loss

## 2024-05-22 NOTE — Assessment & Plan Note (Signed)
 Lab Results  Component Value Date   HGBA1C 7.0 (A) 05/11/2024   HGBA1C 6.7 (A) 12/30/2023   HGBA1C 6.9 (H) 10/29/2023   Jardiance , mounjaro  and weight loss, insulin   Under endorinology care

## 2024-05-22 NOTE — Patient Instructions (Addendum)
 Go up on your vitamin D  from 2000 to 4000 international units daily    Stay active  Add some strength training to your routine, this is important for bone and brain health and can reduce your risk of falls and help your body use insulin  properly and regulate weight  Light weights, exercise bands , and internet videos are a good way to start  Yoga (chair or regular), machines , floor exercises or a gym with machines are also good options    I put the referral in for dermatology for spots on your hands  Please let us  know if you don't hear in 1-2 weeks to set that up (mychart message or call or letter)   Keep trying to decrease your alcohol intake

## 2024-05-25 ENCOUNTER — Other Ambulatory Visit: Payer: Self-pay

## 2024-05-25 ENCOUNTER — Ambulatory Visit: Admitting: Physical Medicine and Rehabilitation

## 2024-05-25 VITALS — BP 149/87 | HR 87

## 2024-05-25 DIAGNOSIS — M5416 Radiculopathy, lumbar region: Secondary | ICD-10-CM | POA: Diagnosis not present

## 2024-05-25 MED ORDER — METHYLPREDNISOLONE ACETATE 80 MG/ML IJ SUSP
40.0000 mg | Freq: Once | INTRAMUSCULAR | Status: AC
  Administered 2024-05-25: 40 mg

## 2024-05-25 NOTE — Progress Notes (Signed)
 Pain Scale   Average Pain 4 Patietn advising he has chronic lower back pain increasing when standing ans decreasing when walking.        +Driver, -BT, -Dye Allergies.

## 2024-06-01 NOTE — Progress Notes (Signed)
 Mitchell Palmer - 58 y.o. male MRN [id number]  Date of birth: November 24, 1965  Office Visit Note: Visit Date: 05/25/2024 PCP: Randeen Laine LABOR, MD Referred by: Tower, Laine LABOR, MD  Subjective: No chief complaint on file.  HPI:  Mitchell Palmer is a 58 y.o. male who comes in today at the request of Duwaine Pouch, FNP for planned Right L4-5 Lumbar Interlaminar epidural steroid injection with fluoroscopic guidance.  The patient has failed conservative care including home exercise, medications, time and activity modification.  This injection will be diagnostic and hopefully therapeutic.  Please see requesting physician notes for further details and justification.   ROS Otherwise per HPI.  Assessment & Plan: Visit Diagnoses:    ICD-10-CM   1. Lumbar radiculopathy  M54.16 XR C-ARM NO REPORT    Epidural Steroid injection    methylPREDNISolone  acetate (DEPO-MEDROL ) injection 40 mg      Plan: No additional findings.   Meds & Orders:  Meds ordered this encounter  Medications   methylPREDNISolone  acetate (DEPO-MEDROL ) injection 40 mg    Orders Placed This Encounter  Procedures   XR C-ARM NO REPORT   Epidural Steroid injection    Follow-up: Return for visit to requesting provider as needed.   Procedures: No procedures performed  Lumbar Epidural Steroid Injection - Interlaminar Approach with Fluoroscopic Guidance  Patient: Mitchell Palmer      Date of Birth: [birth year] MRN: [id number] PCP: Randeen Laine LABOR, MD      Visit Date: 05/25/2024   Universal Protocol:     Consent Given By: the patient  Position: PRONE  Additional Comments: Vital signs were monitored before and after the procedure. Patient was prepped and draped in the usual sterile fashion. The correct patient, procedure, and site was verified.   Injection Procedure Details:   Procedure diagnoses: Lumbar radiculopathy [M54.16]   Meds Administered:  Meds ordered this encounter  Medications   methylPREDNISolone  acetate  (DEPO-MEDROL ) injection 40 mg     Laterality: Right  Location/Site:  L4-5  Needle: 3.5 in., 20 ga. Tuohy  Needle Placement: Paramedian epidural  Findings:   -Comments: Excellent flow of contrast into the epidural space.  Procedure Details: Using a paramedian approach from the side mentioned above, the region overlying the inferior lamina was localized under fluoroscopic visualization and the soft tissues overlying this structure were infiltrated with 4 ml. of 1% Lidocaine  without Epinephrine . The Tuohy needle was inserted into the epidural space using a paramedian approach.   The epidural space was localized using loss of resistance along with counter oblique bi-planar fluoroscopic views.  After negative aspirate for air, blood, and CSF, a 2 ml. volume of Isovue-250 was injected into the epidural space and the flow of contrast was observed. Radiographs were obtained for documentation purposes.    The injectate was administered into the level noted above.   Additional Comments:  The patient tolerated the procedure well Dressing: 2 x 2 sterile gauze and Band-Aid    Post-procedure details: Patient was observed during the procedure. Post-procedure instructions were reviewed.  Patient left the clinic in stable condition.   Clinical History: CLINICAL DATA:  Chronic low back pain for 20 years   EXAM: MRI LUMBAR SPINE WITHOUT CONTRAST   TECHNIQUE: Multiplanar, multisequence MR imaging of the lumbar spine was performed. No intravenous contrast was administered.   COMPARISON:  05/25/2018   FINDINGS: Segmentation: In keeping with the prior exam, there are 5 lumbar type vertebral bodies.   Alignment: Trace retrolisthesis of L1 on L2, L2 on  L3, and L3 on L4, which have progressed slightly since the prior exam. There is now trace retrolisthesis of L4 on L5. unchanged trace anterolisthesis of L5 on S1. Dextrocurvature of the thoracolumbar junction and upper lumbar spine, with  compensatory levocurvature lower lumbar spine.   Vertebrae: No acute fracture or suspicious osseous lesion. Endplate degenerative changes, eccentric to the left L2-L3 and eccentric to the right at L4-L5.   Conus medullaris and cauda equina: Conus extends to the L1 level. Possible mild clumping of the nerve roots at L4-L5 through L5-S1. Conus and cauda equina appear normal.   Paraspinal and other soft tissues: Negative.   Disc levels:   T11-T12: Seen only on the sagittal images. Small left subarticular disc protrusion. No spinal canal stenosis or neural foraminal narrowing.   T12-L1: Minimal disc bulge. No spinal canal stenosis or neural foraminal narrowing.   L1-L2: Trace retrolisthesis and mild disc bulge. Mild facet arthropathy. No spinal canal stenosis or neural foraminal narrowing.   L2-L3: Trace retrolisthesis with mild disc bulge and superimposed left paracentral disc protrusion. Mild facet arthropathy. Ligamentum flavum hypertrophy. Effacement of the left lateral recess and narrowing of the right lateral recess. Moderate to severe spinal canal stenosis and mild bilateral neural foraminal narrowing, unchanged.   L3-L4: Trace retrolisthesis and moderate disc bulge with superimposed central disc protrusion. Effacement of the left greater than right lateral recess. Moderate facet arthropathy. Ligamentum flavum hypertrophy. Moderate to severe spinal canal stenosis and mild left greater than right neural foraminal narrowing, unchanged.   L4-L5: Redemonstrated dysplastic left-sided posterior elements, with largely conjoined left L4 and L5 neural foramina, which are widely patent. Mild disc bulge with central disc protrusion. Right greater than left facet arthropathy. Narrowing of the right lateral recess. No spinal canal stenosis. Severe right neural foraminal narrowing, which has progressed from the prior exam.   L5-S1: Trace anterolisthesis with disc unroofing and  right paracentral disc extrusion with 5 mm cranial migration. Mild facet arthropathy. No spinal canal stenosis. Severe right neural foraminal narrowing, which has progressed from the prior exam   IMPRESSION: 1. L2-L3 and L3-L4 moderate to severe spinal canal stenosis and mild bilateral neural foraminal narrowing, unchanged. 2. L4-L5 and L5-S1 severe right neural foraminal narrowing, which has progressed from the prior exam. Narrowing of the right lateral recess at L4-L5 could affect the descending right L5 nerve roots. 3. Clumping of the nerve roots at L4-L5 through L5-S1, which can be seen in the setting of arachnoiditis.     Electronically Signed   By: Donald Campion M.D.   On: 11/15/2022 14:26     Objective:  VS:  HT:    WT:   BMI:     BP:(!) 149/87  HR:87bpm  TEMP: ( )  RESP:  Physical Exam Vitals and nursing note reviewed.  Constitutional:      General: He is not in acute distress.    Appearance: Normal appearance. He is not ill-appearing.  HENT:     Head: Normocephalic and atraumatic.     Right Ear: External ear normal.     Left Ear: External ear normal.     Nose: No congestion.  Eyes:     Extraocular Movements: Extraocular movements intact.  Cardiovascular:     Rate and Rhythm: Normal rate.     Pulses: Normal pulses.  Pulmonary:     Effort: Pulmonary effort is normal. No respiratory distress.  Abdominal:     General: There is no distension.     Palpations: Abdomen  is soft.  Musculoskeletal:        General: No tenderness or signs of injury.     Cervical back: Neck supple.     Right lower leg: No edema.     Left lower leg: No edema.     Comments: Patient has good distal strength without clonus.  Skin:    Findings: No erythema or rash.  Neurological:     General: No focal deficit present.     Mental Status: He is alert and oriented to person, place, and time.     Sensory: No sensory deficit.     Motor: No weakness or abnormal muscle tone.      Coordination: Coordination normal.  Psychiatric:        Mood and Affect: Mood normal.        Behavior: Behavior normal.      Imaging: No results found.

## 2024-06-01 NOTE — Procedures (Signed)
 Lumbar Epidural Steroid Injection - Interlaminar Approach with Fluoroscopic Guidance  Patient: Mitchell Palmer      Date of Birth: Oct 21, 1965 MRN: [id number] PCP: Randeen Laine LABOR, MD      Visit Date: 05/25/2024   Universal Protocol:     Consent Given By: the patient  Position: PRONE  Additional Comments: Vital signs were monitored before and after the procedure. Patient was prepped and draped in the usual sterile fashion. The correct patient, procedure, and site was verified.   Injection Procedure Details:   Procedure diagnoses: Lumbar radiculopathy [M54.16]   Meds Administered:  Meds ordered this encounter  Medications   methylPREDNISolone  acetate (DEPO-MEDROL ) injection 40 mg     Laterality: Right  Location/Site:  L4-5  Needle: 3.5 in., 20 ga. Tuohy  Needle Placement: Paramedian epidural  Findings:   -Comments: Excellent flow of contrast into the epidural space.  Procedure Details: Using a paramedian approach from the side mentioned above, the region overlying the inferior lamina was localized under fluoroscopic visualization and the soft tissues overlying this structure were infiltrated with 4 ml. of 1% Lidocaine  without Epinephrine . The Tuohy needle was inserted into the epidural space using a paramedian approach.   The epidural space was localized using loss of resistance along with counter oblique bi-planar fluoroscopic views.  After negative aspirate for air, blood, and CSF, a 2 ml. volume of Isovue-250 was injected into the epidural space and the flow of contrast was observed. Radiographs were obtained for documentation purposes.    The injectate was administered into the level noted above.   Additional Comments:  The patient tolerated the procedure well Dressing: 2 x 2 sterile gauze and Band-Aid    Post-procedure details: Patient was observed during the procedure. Post-procedure instructions were reviewed.  Patient left the clinic in stable condition.

## 2024-06-02 ENCOUNTER — Other Ambulatory Visit: Payer: Self-pay | Admitting: Internal Medicine

## 2024-06-04 ENCOUNTER — Encounter: Admitting: Physical Medicine and Rehabilitation

## 2024-06-08 LAB — OPHTHALMOLOGY REPORT-SCANNED

## 2024-06-09 ENCOUNTER — Encounter: Payer: Self-pay | Admitting: Family Medicine

## 2024-06-09 NOTE — Telephone Encounter (Signed)
 Will route to referral dpt to f/u on, referral was placed on 05/22/24

## 2024-06-11 ENCOUNTER — Other Ambulatory Visit: Payer: Self-pay | Admitting: Cardiology

## 2024-06-11 DIAGNOSIS — I10 Essential (primary) hypertension: Secondary | ICD-10-CM

## 2024-06-15 ENCOUNTER — Other Ambulatory Visit: Payer: Self-pay | Admitting: Medical Genetics

## 2024-06-15 DIAGNOSIS — Z006 Encounter for examination for normal comparison and control in clinical research program: Secondary | ICD-10-CM

## 2024-06-19 ENCOUNTER — Other Ambulatory Visit (HOSPITAL_COMMUNITY): Payer: Self-pay

## 2024-06-19 ENCOUNTER — Encounter: Payer: Self-pay | Admitting: Cardiology

## 2024-06-19 ENCOUNTER — Telehealth: Payer: Self-pay | Admitting: Pharmacy Technician

## 2024-06-19 NOTE — Telephone Encounter (Signed)
 Pharmacy Patient Advocate Encounter  Received notification from [employer] that Prior Authorization for Mounjaro  15MG /0.5ML auto-injectors  has been APPROVED from 06/19/24 to 06/19/25. Unable to obtain price due to refill too soon rejection, last fill date 06/13/24 next available fill date 07/04/24   PA #/Case ID/Reference #: [id number]

## 2024-06-19 NOTE — Telephone Encounter (Signed)
 Pharmacy Patient Advocate Encounter   Received notification from [employer] that prior authorization for Mounjaro  15MG /0.5ML auto-injectors  is due for renewal.   Insurance verification completed.   The patient is insured through [facility].  Action: PA required; PA submitted to above mentioned insurance via Latent Key/confirmation #/EOC [facility] Status is pending

## 2024-06-22 ENCOUNTER — Encounter: Payer: Self-pay | Admitting: Radiology

## 2024-06-29 ENCOUNTER — Other Ambulatory Visit: Payer: Self-pay | Admitting: Cardiology

## 2024-06-29 DIAGNOSIS — I10 Essential (primary) hypertension: Secondary | ICD-10-CM

## 2024-07-07 ENCOUNTER — Other Ambulatory Visit: Payer: Self-pay | Admitting: Internal Medicine

## 2024-07-07 DIAGNOSIS — E11319 Type 2 diabetes mellitus with unspecified diabetic retinopathy without macular edema: Secondary | ICD-10-CM

## 2024-07-09 NOTE — Progress Notes (Signed)
  Cardiology Office Note:  .   Date:  07/21/2024  ID:  [name], DOB 1965/10/19, MRN [id number] PCP: Mitchell Laine LABOR, MD  [facility] Providers Cardiologist:  Oneil Parchment, MD    History of Present Illness: .   Mitchell Palmer is a 58 y.o. male  with a h/o hypertension, hyperlipidemia, carotid artery stenosis, OSA, T2DM.  Patient here for f/u. He gets winded going up steps but unchanged. Has to climb stairs all day long, does yard work otherwise no regular exercise.  ROS:    Studies Reviewed: SABRA    EKG Interpretation Date/Time:  Tuesday July 21 2024 09:58:25 EST Ventricular Rate:  73 PR Interval:  222 QRS Duration:  92 QT Interval:  362 QTC Calculation: 398 R Axis:   -16  Text Interpretation: Sinus rhythm with 1st degree A-V block When compared with ECG of 23-Jul-2022 16:56, Confirmed by Mitchell Palmer [phone]) on 07/21/2024 10:07:10 AM    Prior CV Studies:   Nuclear stress test 08/07/2022-low risk no ischemia EF 53 Echocardiogram 09/03/2022-EF 55% grade 1 diastolic dysfunction  Carotid [id number]  Summary:  Right Carotid: Velocities in the right ICA are consistent with a 1-39%  stenosis.   Left Carotid: Velocities in the left ICA are consistent with a 1-39%  stenosis.   Vertebrals: Bilateral vertebral arteries demonstrate antegrade flow. Left               vertebral artery demonstrates high resistant flow.  Subclavians: Normal flow hemodynamics were seen in bilateral subclavian               arteries.   *See table(s) above for measurements and observations.      Electronically signed by Dorn Lesches MD on 03/19/2023 at 12:47:53 PM. Risk Assessment/Calculations:             Physical Exam:   VS:  BP 138/70 (BP Location: Left Arm, Patient Position: Sitting, Cuff Size: Normal)   Pulse 73   Ht 5' 8 (1.727 m)   Wt 210 lb (95.3 kg)   BMI 31.93 kg/m    Orhtostatics: No data found. Wt Readings from Last 3 Encounters:  07/21/24 210 lb (95.3 kg)  05/22/24  211 lb 8 oz (95.9 kg)  05/11/24 211 lb 12.8 oz (96.1 kg)    GEN: Obese, in no acute distress NECK: No JVD; No carotid bruits CARDIAC:  RRR, no murmurs, rubs, gallops RESPIRATORY:  Clear to auscultation without rales, wheezing or rhonchi  ABDOMEN: Soft, non-tender, non-distended EXTREMITIES:  No edema; No deformity   ASSESSMENT AND PLAN: .    HTN-controlled on benicar -labs good in Oct.  150 min exercise weekly discussed  HLD-LDL 43, trig 390 05/2024 on crestor   OSA-off CPAP since he lost 50 lbs, unable to tolerate the mask  Carotid artery stenosis 1-39%  T2DM-A1C 7 managed by endocrine        Dispo: f/u in 1 yr.  Signed, Palmer Parthenia, PA-C

## 2024-07-17 ENCOUNTER — Encounter: Payer: Self-pay | Admitting: Orthopaedic Surgery

## 2024-07-19 ENCOUNTER — Other Ambulatory Visit: Payer: Self-pay | Admitting: Family Medicine

## 2024-07-21 ENCOUNTER — Other Ambulatory Visit: Payer: Self-pay

## 2024-07-21 ENCOUNTER — Encounter: Payer: Self-pay | Admitting: Physician Assistant

## 2024-07-21 ENCOUNTER — Ambulatory Visit: Attending: Physician Assistant | Admitting: Physician Assistant

## 2024-07-21 VITALS — BP 138/70 | HR 73 | Ht 68.0 in | Wt 210.0 lb

## 2024-07-21 DIAGNOSIS — G4733 Obstructive sleep apnea (adult) (pediatric): Secondary | ICD-10-CM

## 2024-07-21 DIAGNOSIS — Z794 Long term (current) use of insulin: Secondary | ICD-10-CM

## 2024-07-21 DIAGNOSIS — E1169 Type 2 diabetes mellitus with other specified complication: Secondary | ICD-10-CM

## 2024-07-21 DIAGNOSIS — I10 Essential (primary) hypertension: Secondary | ICD-10-CM | POA: Diagnosis not present

## 2024-07-21 DIAGNOSIS — E785 Hyperlipidemia, unspecified: Secondary | ICD-10-CM

## 2024-07-21 DIAGNOSIS — M1711 Unilateral primary osteoarthritis, right knee: Secondary | ICD-10-CM

## 2024-07-21 DIAGNOSIS — I6523 Occlusion and stenosis of bilateral carotid arteries: Secondary | ICD-10-CM

## 2024-07-21 DIAGNOSIS — E11319 Type 2 diabetes mellitus with unspecified diabetic retinopathy without macular edema: Secondary | ICD-10-CM

## 2024-07-21 NOTE — Patient Instructions (Signed)
 Medication Instructions:  NO CHANGES  Lab Work: NONE TO BE DONE TODAY.  Testing/Procedures: NONE  Follow-Up: At [facility], you and your health needs are our priority.  As part of our continuing mission to provide you with exceptional heart care, our providers are all part of one team.  This team includes your primary Cardiologist (physician) and Advanced Practice Providers or APPs (Physician Assistants and Nurse Practitioners) who all work together to provide you with the care you need, when you need it.  Your next appointment:   1 YEAR  Provider:   Oneil Parchment, MD    Other Instructions: PLEASE BE TRY TO EXERCISE AT LEAST 150 MINUTES EACH WEEK.

## 2024-07-29 ENCOUNTER — Telehealth: Payer: Self-pay | Admitting: Orthopaedic Surgery

## 2024-07-29 NOTE — Telephone Encounter (Signed)
 Felecia from [employer] called and said she didn't see the authorization and needs you to send it again. CB#[phone]

## 2024-07-30 ENCOUNTER — Other Ambulatory Visit: Payer: Self-pay | Admitting: Family Medicine

## 2024-07-30 NOTE — Telephone Encounter (Signed)
Talked with patient and appt.has been scheduled for gel injection.  

## 2024-08-10 ENCOUNTER — Encounter: Payer: Self-pay | Admitting: Orthopaedic Surgery

## 2024-08-10 ENCOUNTER — Ambulatory Visit: Admitting: Orthopaedic Surgery

## 2024-08-10 DIAGNOSIS — M1711 Unilateral primary osteoarthritis, right knee: Secondary | ICD-10-CM | POA: Diagnosis not present

## 2024-08-10 LAB — OPHTHALMOLOGY REPORT-SCANNED

## 2024-08-10 MED ORDER — SODIUM HYALURONATE 60 MG/3ML IX PRSY
60.0000 mg | PREFILLED_SYRINGE | INTRA_ARTICULAR | Status: AC | PRN
  Administered 2024-08-10: 60 mg via INTRA_ARTICULAR

## 2024-08-10 NOTE — Progress Notes (Signed)
" ° °  Procedure Note  Patient: Mitchell Palmer             Date of Birth: [birth year]           MRN: [id number]             Visit Date: 08/10/2024  Procedures: Visit Diagnoses:  1. Unilateral primary osteoarthritis, right knee     Large Joint Inj: R knee on 08/10/2024 8:28 AM Indications: diagnostic evaluation and pain Details: 22 G 1.5 in needle, superolateral approach  Arthrogram: No  Medications: 60 mg Sodium Hyaluronate 60 MG/3ML Outcome: tolerated well, no immediate complications Procedure, treatment alternatives, risks and benefits explained, specific risks discussed. Consent was given by the patient. Immediately prior to procedure a time out was called to verify the correct patient, procedure, equipment, support staff and site/side marked as required. Patient was prepped and draped in the usual sterile fashion.    Patient comes in today for a hyaluronic acid injection in his right knee with Durolane to treat the pain from osteoarthritis mainly the patellofemoral joint.  He had an injection in his left knee back in July and that is done well.  The right knee was last injected in May and its only started to hurt recently.  He said no acute changes in medical status.  He does have some type of injection in his today seeing Dr. [name].  On exam his right knee shows no effusion.  I did place Durolane in his right knee that did cause him significant pain after the injection.  I recommended ibuprofen or Aleve  and Tylenol .  He knows to wait at least 6 months between these types of injections. Lot #[id number]    "

## 2024-08-17 ENCOUNTER — Other Ambulatory Visit: Payer: Self-pay | Admitting: Internal Medicine

## 2024-08-17 DIAGNOSIS — E1122 Type 2 diabetes mellitus with diabetic chronic kidney disease: Secondary | ICD-10-CM

## 2024-08-27 ENCOUNTER — Encounter: Payer: Self-pay | Admitting: Internal Medicine

## 2024-08-27 ENCOUNTER — Ambulatory Visit (INDEPENDENT_AMBULATORY_CARE_PROVIDER_SITE_OTHER): Admitting: Internal Medicine

## 2024-08-27 VITALS — BP 120/64 | HR 77 | Ht 68.0 in | Wt 213.6 lb

## 2024-08-27 DIAGNOSIS — Z794 Long term (current) use of insulin: Secondary | ICD-10-CM | POA: Diagnosis not present

## 2024-08-27 DIAGNOSIS — E7800 Pure hypercholesterolemia, unspecified: Secondary | ICD-10-CM

## 2024-08-27 DIAGNOSIS — E039 Hypothyroidism, unspecified: Secondary | ICD-10-CM

## 2024-08-27 DIAGNOSIS — E1122 Type 2 diabetes mellitus with diabetic chronic kidney disease: Secondary | ICD-10-CM

## 2024-08-27 LAB — POCT GLYCOSYLATED HEMOGLOBIN (HGB A1C): Hemoglobin A1C: 7.1 % — AB (ref 4.0–5.6)

## 2024-08-27 NOTE — Patient Instructions (Addendum)
 Please continue: - Jardiance  25 mg before breakfast - Mounjaro  15 mg weekly - Humalog  (15 min before each meal) 25-30 units for breakfast but 35-40 before dinner  Decrease: - Toujeo  20 units in am and 30 units in the evening  STOP SWEET DRINKS!  Please continue levothyroxine  75 mcg daily.  Take the thyroid  hormone every day, with water, at least 30 minutes before breakfast, separated by at least 4 hours from: - acid reflux medications - calcium  - iron - multivitamins  Please return in 3-4 months.

## 2024-08-27 NOTE — Progress Notes (Signed)
 Continue subjective:     Patient ID: Mitchell Palmer, male   DOB: [birth year], 59 y.o.   MRN: [id number]   HPI Mitchell Palmer is a 59 y.o. man, returning for followup for DM2, dx in 2000, uncontrolled, insulin -dependent, with complications (CKD stage 3, erectile dysfunction, mild DR). Last visit  3.5 months ago.  Interim history: He has steroid injections for generalized joint pains.   No increased urination, nausea, chest pain.   He continues to work nights. He continues to drink sodas.  Reviewed HbA1c levels: Lab Results  Component Value Date   HGBA1C 7.0 (A) 05/11/2024   HGBA1C 6.7 (A) 12/30/2023   HGBA1C 6.9 (H) 10/29/2023   HGBA1C 6.6 (A) 08/29/2023   HGBA1C 6.9 04/25/2023   HGBA1C 7.3 (A) 11/27/2022   HGBA1C 6.8 (A) 07/25/2022   HGBA1C 7.3 (A) 03/21/2022   HGBA1C 8.3 (A) 11/06/2021   HGBA1C 8.1 (A) 02/22/2021   HGBA1C 8.0 (A) 10/17/2020   HGBA1C 7.8 (A) 05/11/2020   HGBA1C 7.5 (A) 01/07/2020   HGBA1C 7.5 (A) 09/08/2019   HGBA1C 7.1 (A) 04/16/2019   HGBA1C 7.6 (A) 09/03/2018   HGBA1C 8.2 (A) 05/21/2018   HGBA1C 7.4 (A) 01/27/2018   HGBA1C 7.6 10/21/2017   HGBA1C 8.0 05/10/2017  04/25/2023: HbA1c 6.9%  He is on: - Jardiance  25 mg daily - Mounjaro  10 >> 12.5 >> 15 units weekly - Lantus  40-46 units 2x a day >> 30-40 units 2x a day (use the lower dose before starting the shift) >> Toujeo  now - Humalog   (15 min before each meal) 25-30 units for breakfast, 20-25 units before the rest of the meals >> 25-30 units for breakfast but 35-40 before dinner Uses 10-15 units for correction He was previously on: - Invokana  - stopped 08/2016 - leg tingling - Cycloset  - started 04/22/2013 >> at 1.6 mg daily (tried 3 tabs >> dizziness >> backed off to 2) - Victoza  1.8 mg daily - Actos 45 mg daily - Januvia - glyburide/metformin  - Avandia. - Bydureon  2 mg weekly - he hated the thick needle  - Trulicity  - Basaglar  - Metformin  - stopped 2023 2/2 fatigue  He is checking his CBG more than  4 times a day with his freestyle libre 3 CGM:  Prev.: - am: 90-170 - 2h after b'fast: ? - lunch: 90-170 - 2h after lunch: ? - dinner:100-160 - 2h after dinner:190-240 - bedtime: 150-200  Previously: Last 3 mo: GMI: 7.2% Last week: GMI: 7.2%     Lowest: 57 >> 59 >> 70 >> 60s;  He has hypoglycemia awareness in the 80s. Highest 400 (steroids) >> .SABRASABRA  300.  He works nights: 6 PM to 7:30 AM 3-4 days a week.  Meals: - 5:30-6 pm (at work): subway sandwich >> 2 hot dogs + doritos >> chicken biscuit - snack at 9-10 pm: pistachios or 1/2 sandwich; pizza  >> 12 am: nabs +peanuts, apples - 11 am-12 pm: salad, meat + veggies + rice (leftovers from take out), pizza  + HL. Last lipid panel: Lab Results  Component Value Date   CHOL 165 05/20/2024   HDL 43.20 05/20/2024   LDLCALC 43 05/20/2024   LDLDIRECT 63.0 10/27/2021   TRIG 390.0 (H) 05/20/2024   CHOLHDL 4 05/20/2024  On Lipitor 40.  -+ Stage II CKD, last BUN/Cr:  Lab Results  Component Value Date   BUN 15 05/20/2024   CREATININE 1.09 05/20/2024   Lab Results  Component Value Date   MICRALBCREAT Unable to calculate 04/23/2024  MICRALBCREAT NOTE 12/30/2023  On Benicar  40 >> 20 >> 5 mg daily.  - last eye exam was 10/2023: + DR.  Dr. Octavia >> now Dr. [name].  He had laser surgery [facility].  Also, In 07/2018 >> had EMG surgery in his R eye for bleeding. He had cataract sx (Dr. Octavia), another surgery (Dr. [name]) on R eye in 2021.  Getting intraocular injections.  - no numbness or tingling in his legs.  Foot exam -11/07/2023 (Dr. Joya).  He also has a history of HTN, GERD, esophageal stricture, plantar fasciitis, eustachian tube dysfunction - status post ear surgery.  Hypothyroidism: -Started levothyroxine  01/2016 -He was skipping doses in the past and TFTs were fluctuating.  Pt is on levothyroxine  75 mcg daily, taken: - in am - fasting - at least 30 min from b'fast - no calcium  - no iron - no multivitamins - no PPIs -  not on Biotin  Reviewed his TFTs: Lab Results  Component Value Date   TSH 3.04 05/20/2024   TSH 2.30 05/01/2023   TSH 2.66 02/11/2023   TSH 4.19 10/27/2021   TSH 2.47 09/02/2020   TSH 1.85 10/20/2019   TSH 2.95 10/21/2018   TSH 5.18 (H) 09/18/2018   TSH 4.25 10/31/2017   TSH 1.17 09/21/2016   In 07/2018, he had C spine surgery.  Review of Systems + see HPI  I reviewed pt's medications, allergies, PMH, social hx, family hx, and changes were documented in the history of present illness. Otherwise, unchanged from my initial visit note.  Past Medical History:  Diagnosis Date   Allergy    allegic rhinitis   Diabetes mellitus    type II   Esophageal stricture    GERD (gastroesophageal reflux disease)    History of hernia repair    as a baby   Hyperlipidemia    Hyperplastic polyp of intestine 2010   Hypertension    OSA (obstructive sleep apnea) 04/18/2022   Proliferative diabetic retinopathy of left eye (HCC) 03/31/2020   This is active as a patient still has tufts of the anterior segment neovascularization, rubeosis in the left eye every region of capillary nonperfusion should be treated with PRP.   S/P ear surgery, follow-up exam    for drainage   Vitreous hemorrhage of right eye (HCC) 12/24/2019   Past Surgical History:  Procedure Laterality Date   BIOPSY  12/07/2021   Procedure: BIOPSY;  Surgeon: Aneita Gwendlyn DASEN, MD;  Location: [facility];  Service: Gastroenterology;;   CATARACT EXTRACTION     COLONOSCOPY  03/07/2016   COLONOSCOPY WITH PROPOFOL  N/A 12/07/2021   Procedure: COLONOSCOPY WITH PROPOFOL ;  Surgeon: Aneita Gwendlyn DASEN, MD;  Location: [facility];  Service: Gastroenterology;  Laterality: N/A;   ESOPHAGOGASTRODUODENOSCOPY  04/20/2009   stricture/GERD   ESOPHAGOGASTRODUODENOSCOPY (EGD) WITH PROPOFOL  N/A 12/07/2021   Procedure: ESOPHAGOGASTRODUODENOSCOPY (EGD) WITH PROPOFOL ;  Surgeon: Aneita Gwendlyn DASEN, MD;  Location: [facility];  Service: Gastroenterology;   Laterality: N/A;   EYE SURGERY     HERNIA REPAIR     age 3-midline   INNER EAR SURGERY  08/20/1986   went in behind rt ear-blockage   KNEE ARTHROSCOPY Right 2023   NECK SURGERY     [facility]   POLYPECTOMY     SAVORY DILATION N/A 12/07/2021   Procedure: SAVORY DILATION;  Surgeon: Aneita Gwendlyn DASEN, MD;  Location: [facility];  Service: Gastroenterology;  Laterality: N/A;   SHOULDER ARTHROSCOPY WITH ROTATOR CUFF REPAIR AND SUBACROMIAL DECOMPRESSION Right 09/29/2012   Procedure:  SHOULDER ARTHROSCOPY WITH ROTATOR CUFF REPAIR AND SUBACROMIAL DECOMPRESSION;  Surgeon: Eva Elsie Herring, MD;  Location: [facility];  Service: Orthopedics;  Laterality: Right;  Right shoulder arthroscopy with subacromial decompression and distal clavicle excision & Debridement of Labrial Tear   SHOULDER SURGERY Left    x2   UPPER GASTROINTESTINAL ENDOSCOPY     Social History   Socioeconomic History   Marital status: Single    Spouse name: Not on file   Number of children: Not on file   Years of education: Not on file   Highest education level: Not on file  Occupational History   Occupation: Designer, Television/film Set  Tobacco Use   Smoking status: Never   Smokeless tobacco: Never  Vaping Use   Vaping status: Never Used  Substance and Sexual Activity   Alcohol use: Yes    Alcohol/week: 12.0 standard drinks of alcohol    Types: 12 Cans of beer per week   Drug use: No   Sexual activity: Not on file  Other Topics Concern   Not on file  Social History Narrative   Not on file   Social Drivers of Health   Tobacco Use: Low Risk (08/10/2024)   Patient History    Smoking Tobacco Use: Never    Smokeless Tobacco Use: Never    Passive Exposure: Not on file  Financial Resource Strain: Not on file  Food Insecurity: Not on file  Transportation Needs: Not on file  Physical Activity: Not on file  Stress: Not on file  Social Connections: Not on file  Intimate Partner Violence: Not on file   Depression (EYV7-0): Medium Risk (05/22/2024)   Depression (PHQ2-9)    PHQ-2 Score: 5  Alcohol Screen: Not on file  Housing: Not on file  Utilities: Not on file  Health Literacy: Not on file   Current Outpatient Medications on File Prior to Visit  Medication Sig Dispense Refill   ALPHA LIPOIC ACID PO Take 1 capsule by mouth daily.     cetirizine (ZYRTEC) 10 MG tablet Take 10 mg by mouth daily.     Cholecalciferol (VITAMIN D ) 50 MCG (2000 UT) tablet Take 2,000 Units by mouth daily.     Continuous Glucose Sensor (FREESTYLE LIBRE 3 PLUS SENSOR) MISC Inject 1 Device into the skin continuous. Change every 15 days 6 each 3   cyclobenzaprine  (FLEXERIL ) 10 MG tablet Take 1 tablet (10 mg total) by mouth 3 (three) times daily as needed. 30 tablet 3   fluticasone  (FLONASE ) 50 MCG/ACT nasal spray PLACE 2 SPRAYS INTO BOTH NOSTRILS DAILY AS NEEDED FOR ALLERGIES OR RHINITIS. 16 mL 2   insulin  glargine, 1 Unit Dial , (TOUJEO  SOLOSTAR) 300 UNIT/ML Solostar Pen INJECT 42 UNITS INTO THE SKIN EVERY MORNING AND 50 UNITS INTO THE SKIN EVERY EVENING 30 mL 2   insulin  lispro (HUMALOG ) 100 UNIT/ML KwikPen INJECT 25-30 UNITS UNDER THE SKIN THREE TIMES A DAY WITH MEALS AND 10 TO 12 UNITS UNDER THE SKIN WITH SNACKS 60 mL 3   Insulin  Pen Needle (EMBECTA PEN NEEDLE ULTRAFINE) 31G X 5 MM MISC USE AS DIRECTED 4 TIMES A DAY 400 each 3   JARDIANCE  25 MG TABS tablet TAKE 1 TABLET BY MOUTH EVERY DAY BEFORE BREAKFAST 30 tablet 11   levothyroxine  (SYNTHROID ) 75 MCG tablet TAKE 1 TABLET BY MOUTH DAILY BEFORE BREAKFAST 30 tablet 2   Magnesium 250 MG CAPS Take 1 capsule by mouth daily.     MOUNJARO  15 MG/0.5ML Pen INJECT 15 MG UNDER THE SKIN  ONCE WEEKLY 2 mL 3   neomycin -polymyxin-hydrocortisone  (CORTISPORIN) OTIC solution Place 3 drops into the right ear in the morning, at noon, and at bedtime. 10 mL 0   olmesartan  (BENICAR ) 5 MG tablet TAKE 1 TABLET (5 MG TOTAL) BY MOUTH DAILY. 30 tablet 1   omeprazole  (PRILOSEC) 40 MG capsule  TAKE 1 CAPSULE BY MOUTH EVERY DAY 90 capsule 1   ONETOUCH ULTRA test strip USE TO TEST BLOOD SUGAR 4 TIMES DAILY AS DIRECTED 400 strip 11   rosuvastatin  (CRESTOR ) 10 MG tablet TAKE 1 TABLET BY MOUTH EVERY DAY 90 tablet 2   tadalafil  (CIALIS ) 20 MG tablet TAKE 1 TABLET BY MOUTH EVERY DAY AS NEEDED FOR ERECTILE DYSFUNCTION 4 tablet 5   vitamin B-12 (CYANOCOBALAMIN ) 500 MCG tablet Take 500 mcg by mouth daily.     No current facility-administered medications on file prior to visit.   Allergies  Allergen Reactions   Codeine     Anxious   Lipitor [Atorvastatin ]     Muscle pain    Family History  Problem Relation Age of Onset   Diabetes Mother    Colon cancer Father 66   Diabetes Other    Colon polyps Brother    Diabetes Maternal Aunt    Cancer Cousin        breast cancer     Objective:   Physical Exam BP 120/64   Pulse 77   Ht 5' 8 (1.727 m)   Wt 213 lb 9.6 oz (96.9 kg)   SpO2 98%   BMI 32.48 kg/m     Wt Readings from Last 3 Encounters:  08/27/24 213 lb 9.6 oz (96.9 kg)  07/21/24 210 lb (95.3 kg)  05/22/24 211 lb 8 oz (95.9 kg)   Constitutional: overweight, in NAD Eyes: no exophthalmos ENT: no masses palpated in neck, no cervical lymphadenopathy Cardiovascular: RRR, No MRG Respiratory: CTA B Musculoskeletal: no deformities Skin: no rashes Neurological: no tremor with outstretched hands Diabetic Foot Exam - Simple   Simple Foot Form Diabetic Foot exam was performed with the following findings: Yes   Visual Inspection No deformities, no ulcerations, no other skin breakdown bilaterally: Yes Sensation Testing Intact to touch and monofilament testing bilaterally: Yes Pulse Check Posterior Tibialis and Dorsalis pulse intact bilaterally: Yes Comments    Assessment:     1. 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 HL  3.  Hypothyroidism    Plan:     1.  Patient with longstanding, previously uncontrolled type 2 diabetes on a complex medication regimen including SGLT2 inhibitor, weekly GLP-1/GIP receptor agonist and basal-bolus insulin , with fair control.  At last visit, HbA1c was higher, at 7.0%, increased from 6.7%.  We could still not download his CGM and I advised him to get in touch with the company to see if this can be fixed.  However, reviewing the CGM report on his phone, it appears that the sugars were fairly well-controlled until his evening meal, and then increasing abruptly.  He was taking 25 to 30 units of Humalog  before this meal and he was waiting 15 to 20 minutes before eating.  In that case, I advised him to increase the dose of Humalog  with a meal but I also advised him to try to work on improving the meal.  We did not change the rest of the regimen. CGM interpretation: -At today's visit,  we reviewed his CGM downloads: It appears that 72% of values are in target range (goal >70%), while 28% are higher than 180 (goal <25%), and 0% are lower than 70 (goal <4%).  The calculated average blood sugar is 157.  The projected HbA1c for the next 3 months (GMI) is 7.1%. -Reviewing the CGM trends, sugars are fluctuating in the upper half of the dropped target range, but decreasing during the time when he sleeps in the first half of the day.  He mentions occasionally waking up in the 60s.  He then corrects this by eating cookies.  I advised him to reduce the dose of Toujeo  that he takes in the morning and I also advised him to try to not overcorrect low blood sugars.  His sugars are increasing abruptly after eating the cookies that waking up so hopefully eliminating the need to eat them and bolusing appropriately after dinner will help.  Would not change the rest of the regimen for now. I did advise him to stop sodas, which he is still drinking. - I advised him to: Patient Instructions  Please continue: - Jardiance  25  mg before breakfast - Mounjaro  15 mg weekly - Humalog  (15 min before each meal) 25-30 units for breakfast but 35-40 before dinner  Decrease: - Toujeo  20 units in am and 30 units in the evening  STOP SWEET DRINKS!  Please continue levothyroxine  75 mcg daily.  Take the thyroid  hormone every day, with water, at least 30 minutes before breakfast, separated by at least 4 hours from: - acid reflux medications - calcium  - iron - multivitamins  Please return in 3-4 months.  - we checked his HbA1c: 7.1% (slightly higher) - advised to check sugars at different times of the day - 4x a day, rotating check times - advised for yearly eye exams >> he is UTD - return to clinic in 3-4 months  2. HL  - Latest lipid panel was reviewed from 05/2024: Fractions at goal except for high triglycerides: Lab Results  Component Value Date   CHOL 165 05/20/2024   HDL 43.20 05/20/2024   LDLCALC 43 05/20/2024   LDLDIRECT 63.0 10/27/2021   TRIG 390.0 (H) 05/20/2024   CHOLHDL 4 05/20/2024  -He continues on Lipitor 40 mg daily without side effects  3. Hypothyroidism - latest thyroid  labs reviewed with pt. >> normal: Lab Results  Component Value Date   TSH 3.04 05/20/2024  - he continues on LT4 75 mcg daily - pt feels good on this dose. - we discussed about taking the thyroid  hormone every day, with water, >30 minutes before breakfast, separated by >4 hours from acid reflux medications, calcium , iron, multivitamins. Pt. is taking it correctly.   Lela Fendt, MD PhD [facility]

## 2024-08-28 ENCOUNTER — Encounter: Payer: Self-pay | Admitting: Orthopaedic Surgery

## 2024-08-28 ENCOUNTER — Other Ambulatory Visit: Payer: Self-pay

## 2024-08-28 DIAGNOSIS — M1712 Unilateral primary osteoarthritis, left knee: Secondary | ICD-10-CM

## 2024-09-01 ENCOUNTER — Other Ambulatory Visit: Payer: Self-pay | Admitting: Internal Medicine

## 2024-09-02 ENCOUNTER — Other Ambulatory Visit: Payer: Self-pay | Admitting: Cardiology

## 2024-09-02 DIAGNOSIS — I10 Essential (primary) hypertension: Secondary | ICD-10-CM

## 2024-09-07 ENCOUNTER — Ambulatory Visit: Admitting: Internal Medicine

## 2024-09-08 ENCOUNTER — Ambulatory Visit: Admitting: Internal Medicine

## 2024-09-09 LAB — OPHTHALMOLOGY REPORT-SCANNED

## 2024-09-11 ENCOUNTER — Ambulatory Visit: Admitting: Family Medicine

## 2024-09-11 ENCOUNTER — Encounter: Payer: Self-pay | Admitting: Family Medicine

## 2024-09-11 VITALS — BP 128/80 | HR 78 | Temp 98.6°F | Ht 68.0 in | Wt 214.1 lb

## 2024-09-11 DIAGNOSIS — E13311 Other specified diabetes mellitus with unspecified diabetic retinopathy with macular edema: Secondary | ICD-10-CM | POA: Diagnosis not present

## 2024-09-11 DIAGNOSIS — I10 Essential (primary) hypertension: Secondary | ICD-10-CM | POA: Diagnosis not present

## 2024-09-11 DIAGNOSIS — R600 Localized edema: Secondary | ICD-10-CM | POA: Diagnosis not present

## 2024-09-11 DIAGNOSIS — E039 Hypothyroidism, unspecified: Secondary | ICD-10-CM

## 2024-09-11 DIAGNOSIS — G4733 Obstructive sleep apnea (adult) (pediatric): Secondary | ICD-10-CM

## 2024-09-11 DIAGNOSIS — E669 Obesity, unspecified: Secondary | ICD-10-CM

## 2024-09-11 DIAGNOSIS — E11319 Type 2 diabetes mellitus with unspecified diabetic retinopathy without macular edema: Secondary | ICD-10-CM | POA: Insufficient documentation

## 2024-09-11 LAB — BASIC METABOLIC PANEL WITH GFR
BUN: 16 mg/dL (ref 6–23)
CO2: 31 meq/L (ref 19–32)
Calcium: 9.3 mg/dL (ref 8.4–10.5)
Chloride: 99 meq/L (ref 96–112)
Creatinine, Ser: 1.01 mg/dL (ref 0.40–1.50)
GFR: 82.55 mL/min
Glucose, Bld: 166 mg/dL — ABNORMAL HIGH (ref 70–99)
Potassium: 4.3 meq/L (ref 3.5–5.1)
Sodium: 138 meq/L (ref 135–145)

## 2024-09-11 LAB — HEPATIC FUNCTION PANEL
ALT: 25 U/L (ref 3–53)
AST: 26 U/L (ref 5–37)
Albumin: 4.2 g/dL (ref 3.5–5.2)
Alkaline Phosphatase: 98 U/L (ref 39–117)
Bilirubin, Direct: 0.1 mg/dL (ref 0.1–0.3)
Total Bilirubin: 0.5 mg/dL (ref 0.2–1.2)
Total Protein: 6.7 g/dL (ref 6.0–8.3)

## 2024-09-11 LAB — BRAIN NATRIURETIC PEPTIDE: Pro B Natriuretic peptide (BNP): 9 pg/mL (ref 1.0–100.0)

## 2024-09-11 LAB — TSH: TSH: 2.33 u[IU]/mL (ref 0.35–5.50)

## 2024-09-11 NOTE — Assessment & Plan Note (Addendum)
 Mild  Today trace/ per pt 1 plus at oph office Some concern about fluid retention worsening macular edema  Otherwise not bothered by it  No nsaids  Reviewed most recent labs Normal microalb in fall  Alcohol is moderate-less than in the past   Differential incl for causes in include, sodium intake,obesity, renal or hepatic cause, diastolic dysf (noted echo 2023) No cardiac symptoms  Discussed effort to cut sodium  Lab today  Reassuring exam

## 2024-09-11 NOTE — Progress Notes (Signed)
 "  Subjective:    Patient ID: Mitchell Palmer, male    DOB: 04/24/1966, 59 y.o.   MRN: [id number]  HPI  Wt Readings from Last 3 Encounters:  09/11/24 214 lb 2 oz (97.1 kg)  08/27/24 213 lb 9.6 oz (96.9 kg)  07/21/24 210 lb (95.3 kg)   32.56 kg/m  Vitals:   09/11/24 1156  BP: 128/80  Pulse: 78  Temp: 98.6 F (37 C)  SpO2: 99%    Pt presents for c/o  Leg swelling    Had injections in eyes  Dr [name]  Recently  He noted that he has fluid in legs  Wondered if ? Kidney   Diabetic retinopathy   Did eat recent salty chips  Is working on cutting sodium in diet   Last A1c 7.1   No nsaids Tylenol  occational   Not enough swelling to cause pain or discomfort    Etoh intake    2-3 drinks on days he does not work  (less than prior)  Other days none   Shortness of breath only on stairs  No trouble lying flat   Lab Results  Component Value Date   ALT 20 05/20/2024   AST 19 05/20/2024   ALKPHOS 78 05/20/2024   BILITOT 0.5 05/20/2024   No results found for: LABPROT  Lab Results  Component Value Date   MICROALBUR <0.7 04/23/2024   MICROALBUR <0.2 12/30/2023    Normal ABIs 02/2023   Cardiology Nuclear stress test 08/07/2022-low risk no ischemia EF 53 Echocardiogram 09/03/2022-EF 55% grade 1 diastolic dysfunction   OSA off cpap after losing weight   HTN bp is stable today  No cp or palpitations or headaches or edema  No side effects to medicines  BP Readings from Last 3 Encounters:  09/11/24 128/80  08/27/24 120/64  07/21/24 138/70     Lab Results  Component Value Date   NA 141 05/20/2024   K 3.8 05/20/2024   CO2 27 05/20/2024   GLUCOSE 253 (H) 05/20/2024   BUN 15 05/20/2024   CREATININE 1.09 05/20/2024   CALCIUM  9.4 05/20/2024   GFR 75.50 05/20/2024   GFRNONAA >60 07/23/2022   Benicar  5 mg daily  Hypothyroid Lab Results  Component Value Date   TSH 3.04 05/20/2024   Levothyroxine  75 mcg daily    Patient Active Problem List    Diagnosis Date Noted   Pedal edema 09/11/2024   Diabetic retinopathy (HCC) 09/11/2024   Actinic keratoses 05/22/2024   Facial injury 04/23/2024   Spinal stenosis of lumbar region 10/29/2023   Diabetic neuropathy (HCC) 10/29/2023   Family history of prostate cancer 08/28/2023   Peripheral neuropathy 02/08/2023   Heel pain, bilateral 02/08/2023   Dyspnea on exertion 07/30/2022   OSA (obstructive sleep apnea) 04/18/2022   Loud snoring 12/12/2021   Family history of colon cancer    Lump on finger 11/06/2021   Pseudophakia, left eye 10/26/2021   Pseudophakia, right eye 10/26/2021   Vitreomacular adhesion of left eye 09/21/2021   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/2021   Vitreous hemorrhage, left eye (HCC) 06/07/2021   Proliferative diabetic retinopathy of left eye with macular edema associated with type 2 diabetes mellitus (HCC) 06/07/2021   Localized swelling, mass and lump, upper limb 05/31/2021   Right ear pain 04/12/2021   Low vitamin D  level 09/14/2020   Prostate cancer screening 09/02/2020   Current use of proton pump inhibitor 09/02/2020   Family history of factor V Leiden mutation 05/30/2020   Left  ankle pain 01/29/2020   Obesity (BMI 30-39.9) 12/24/2019   Proliferative diabetic retinopathy of left eye determined by examination (HCC) 12/24/2019   Nuclear sclerotic cataract of left eye 12/24/2019   Low testosterone  11/13/2019   Fatigue 10/20/2019   Neck pain 04/28/2018   Need for hepatitis B screening test 03/26/2018   Need for hepatitis C screening test 03/26/2018   Paresthesia of both feet 11/01/2017   Hemorrhoids 10/14/2017   Class 2 obesity due to excess calories with body mass index (BMI) of 35.0 to 35.9 in adult 01/24/2016   Hypothyroidism 01/24/2016   Type 2 diabetes mellitus with ophthalmic complication (HCC) 07/28/2015   Alcohol consumption heavy 12/06/2014   Routine general medical examination at a health care facility 09/30/2013   Low back pain 06/17/2012    PLANTAR FASCIITIS, TRAUMATIC 11/09/2009   NEOPLASM UNCERTAIN BEHAVIOR OTHER SPEC SITES 10/07/2009   ESOPHAGEAL STRICTURE 06/27/2009   GERD 03/31/2009   Hyperlipidemia associated with type 2 diabetes mellitus (HCC) 01/06/2009   Essential hypertension 01/06/2009   Allergic rhinitis 01/06/2009   Past Medical History:  Diagnosis Date   Allergy    allegic rhinitis   Diabetes mellitus    type II   Esophageal stricture    GERD (gastroesophageal reflux disease)    History of hernia repair    as a baby   Hyperlipidemia    Hyperplastic polyp of intestine 2010   Hypertension    OSA (obstructive sleep apnea) 04/18/2022   Proliferative diabetic retinopathy of left eye (HCC) 03/31/2020   This is active as a patient still has tufts of the anterior segment neovascularization, rubeosis in the left eye every region of capillary nonperfusion should be treated with PRP.   S/P ear surgery, follow-up exam    for drainage   Vitreous hemorrhage of right eye (HCC) 12/24/2019   Past Surgical History:  Procedure Laterality Date   BIOPSY  12/07/2021   Procedure: BIOPSY;  Surgeon: Aneita Gwendlyn DASEN, MD;  Location: [facility];  Service: Gastroenterology;;   CATARACT EXTRACTION     COLONOSCOPY  03/07/2016   COLONOSCOPY WITH PROPOFOL  N/A 12/07/2021   Procedure: COLONOSCOPY WITH PROPOFOL ;  Surgeon: Aneita Gwendlyn DASEN, MD;  Location: [facility];  Service: Gastroenterology;  Laterality: N/A;   ESOPHAGOGASTRODUODENOSCOPY  04/20/2009   stricture/GERD   ESOPHAGOGASTRODUODENOSCOPY (EGD) WITH PROPOFOL  N/A 12/07/2021   Procedure: ESOPHAGOGASTRODUODENOSCOPY (EGD) WITH PROPOFOL ;  Surgeon: Aneita Gwendlyn DASEN, MD;  Location: [facility];  Service: Gastroenterology;  Laterality: N/A;   EYE SURGERY     HERNIA REPAIR     age 80-midline   INNER EAR SURGERY  08/20/1986   went in behind rt ear-blockage   KNEE ARTHROSCOPY Right 2023   NECK SURGERY     [facility]   POLYPECTOMY     SAVORY DILATION N/A 12/07/2021    Procedure: SAVORY DILATION;  Surgeon: Aneita Gwendlyn DASEN, MD;  Location: [facility];  Service: Gastroenterology;  Laterality: N/A;   SHOULDER ARTHROSCOPY WITH ROTATOR CUFF REPAIR AND SUBACROMIAL DECOMPRESSION Right 09/29/2012   Procedure: SHOULDER ARTHROSCOPY WITH ROTATOR CUFF REPAIR AND SUBACROMIAL DECOMPRESSION;  Surgeon: Eva Elsie Herring, MD;  Location: [facility];  Service: Orthopedics;  Laterality: Right;  Right shoulder arthroscopy with subacromial decompression and distal clavicle excision & Debridement of Labrial Tear   SHOULDER SURGERY Left    x2   UPPER GASTROINTESTINAL ENDOSCOPY     Social History[1] Family History  Problem Relation Age of Onset   Diabetes Mother    Colon cancer Father 26  Diabetes Other    Colon polyps Brother    Diabetes Maternal Aunt    Cancer Cousin        breast cancer   Allergies[2] Medications Ordered Prior to Encounter[3]  Review of Systems  Constitutional:  Negative for activity change, appetite change, fatigue, fever and unexpected weight change.  HENT:  Negative for congestion, rhinorrhea, sore throat and trouble swallowing.   Eyes:  Positive for visual disturbance. Negative for pain, redness and itching.  Respiratory:  Negative for cough, chest tightness, shortness of breath and wheezing.        Only shortness of breath with stairs/from being out of shape  Cardiovascular:  Positive for leg swelling. Negative for chest pain and palpitations.  Gastrointestinal:  Negative for abdominal pain, blood in stool, constipation, diarrhea and nausea.  Endocrine: Negative for cold intolerance, heat intolerance, polydipsia and polyuria.  Genitourinary:  Negative for difficulty urinating, dysuria, frequency and urgency.  Musculoskeletal:  Negative for arthralgias, joint swelling and myalgias.  Skin:  Negative for pallor and rash.  Neurological:  Negative for dizziness, tremors, weakness, numbness and headaches.  Hematological:   Negative for adenopathy. Does not bruise/bleed easily.  Psychiatric/Behavioral:  Negative for decreased concentration and dysphoric mood. The patient is not nervous/anxious.        Objective:   Physical Exam Constitutional:      General: He is not in acute distress.    Appearance: Normal appearance. He is well-developed. He is not ill-appearing or diaphoretic.  HENT:     Head: Normocephalic and atraumatic.  Eyes:     Conjunctiva/sclera: Conjunctivae normal.     Pupils: Pupils are equal, round, and reactive to light.  Neck:     Thyroid : No thyromegaly.     Vascular: No carotid bruit or JVD.  Cardiovascular:     Rate and Rhythm: Normal rate and regular rhythm.     Pulses: Normal pulses.     Heart sounds: Normal heart sounds.     No gallop.  Pulmonary:     Effort: Pulmonary effort is normal. No respiratory distress.     Breath sounds: Normal breath sounds. No wheezing or rales.  Abdominal:     General: There is no distension or abdominal bruit.     Palpations: Abdomen is soft.  Musculoskeletal:     Cervical back: Normal range of motion and neck supple.     Right lower leg: Edema present.     Left lower leg: Edema present.     Comments: Trace edema to mid shin  Lymphadenopathy:     Cervical: No cervical adenopathy.  Skin:    General: Skin is warm and dry.     Coloration: Skin is not pale.     Findings: No rash.  Neurological:     Mental Status: He is alert.     Coordination: Coordination normal.     Deep Tendon Reflexes: Reflexes are normal and symmetric. Reflexes normal.  Psychiatric:        Mood and Affect: Mood normal.           Assessment & Plan:   Problem List Items Addressed This Visit       Cardiovascular and Mediastinum   Essential hypertension   bp in fair control at this time  BP Readings from Last 1 Encounters:  09/11/24 128/80   No changes needed Most recent labs reviewed  Disc lifstyle change with low sodium diet and exercise  Lower blood  pressure with weight loss Down to benicar   5 mg daily       Relevant Orders   Basic metabolic panel with GFR   Hepatic function panel   TSH     Respiratory   OSA (obstructive sleep apnea)   Has not needed cpap since weight loss Sleep is more restful        Endocrine   Hypothyroidism   Hypothyroidism  Pt has no clinical changes, but notes more pedal edema  No change in energy level/ hair or skin/ edema and no tremor Lab Results  Component Value Date   TSH 3.04 05/20/2024    Continues levothyroxine  75 mcg daily from endocrinology  Re check TSH today      Relevant Orders   TSH   Diabetic retinopathy (HCC)   With macular edema Glaucoma suspect          Other   Pedal edema - Primary   Mild  Today trace/ per pt 1 plus at oph office Some concern about fluid retention worsening macular edema  Otherwise not bothered by it  No nsaids  Reviewed most recent labs Normal microalb in fall  Alcohol is moderate-less than in the past   Differential incl for causes in include, sodium intake,obesity, renal or hepatic cause, diastolic dysf (noted echo 2023) No cardiac symptoms  Discussed effort to cut sodium  Lab today  Reassuring exam         Relevant Orders   Basic metabolic panel with GFR   Hepatic function panel   TSH   Brain natriuretic peptide   Obesity (BMI 30-39.9)   Given handout re:  DASH Mediterranean diets   Working on reducing sodium/ processed foods         [1]  Social History Tobacco Use   Smoking status: Never   Smokeless tobacco: Never  Vaping Use   Vaping status: Never Used  Substance Use Topics   Alcohol use: Yes    Alcohol/week: 12.0 standard drinks of alcohol    Types: 12 Cans of beer per week   Drug use: No  [2]  Allergies Allergen Reactions   Codeine     Anxious   Lipitor [Atorvastatin ]     Muscle pain   [3]  Current Outpatient Medications on File Prior to Visit  Medication Sig Dispense Refill   cetirizine (ZYRTEC) 10  MG tablet Take 10 mg by mouth daily.     Cholecalciferol (VITAMIN D ) 50 MCG (2000 UT) tablet Take 2,000 Units by mouth daily.     Continuous Glucose Sensor (FREESTYLE LIBRE 3 PLUS SENSOR) MISC Inject 1 Device into the skin continuous. Change every 15 days 6 each 3   cyclobenzaprine  (FLEXERIL ) 10 MG tablet Take 1 tablet (10 mg total) by mouth 3 (three) times daily as needed. 30 tablet 3   fluticasone  (FLONASE ) 50 MCG/ACT nasal spray PLACE 2 SPRAYS INTO BOTH NOSTRILS DAILY AS NEEDED FOR ALLERGIES OR RHINITIS. 16 mL 2   insulin  glargine, 1 Unit Dial , (TOUJEO  SOLOSTAR) 300 UNIT/ML Solostar Pen INJECT 42 UNITS INTO THE SKIN EVERY MORNING AND 50 UNITS INTO THE SKIN EVERY EVENING 30 mL 2   insulin  lispro (HUMALOG ) 100 UNIT/ML KwikPen INJECT 25-30 UNITS UNDER THE SKIN THREE TIMES A DAY WITH MEALS AND 10 TO 12 UNITS UNDER THE SKIN WITH SNACKS 60 mL 3   Insulin  Pen Needle (EMBECTA PEN NEEDLE ULTRAFINE) 31G X 5 MM MISC USE AS DIRECTED 4 TIMES A DAY 400 each 3   JARDIANCE  25 MG TABS tablet TAKE 1 TABLET BY MOUTH  EVERY DAY BEFORE BREAKFAST 30 tablet 11   levothyroxine  (SYNTHROID ) 75 MCG tablet TAKE 1 TABLET BY MOUTH DAILY BEFORE BREAKFAST 30 tablet 2   Magnesium 250 MG CAPS Take 1 capsule by mouth daily.     MOUNJARO  15 MG/0.5ML Pen INJECT 15 MG UNDER THE SKIN ONCE WEEKLY 2 mL 3   olmesartan  (BENICAR ) 5 MG tablet TAKE 1 TABLET (5 MG TOTAL) BY MOUTH DAILY. 90 tablet 3   omeprazole  (PRILOSEC) 40 MG capsule TAKE 1 CAPSULE BY MOUTH EVERY DAY 90 capsule 1   ONETOUCH ULTRA test strip USE TO TEST BLOOD SUGAR 4 TIMES DAILY AS DIRECTED 400 strip 11   rosuvastatin  (CRESTOR ) 10 MG tablet TAKE 1 TABLET BY MOUTH EVERY DAY 90 tablet 2   tadalafil  (CIALIS ) 20 MG tablet TAKE 1 TABLET BY MOUTH EVERY DAY AS NEEDED FOR ERECTILE DYSFUNCTION 4 tablet 5   vitamin B-12 (CYANOCOBALAMIN ) 500 MCG tablet Take 500 mcg by mouth daily.     No current facility-administered medications on file prior to visit.   "

## 2024-09-11 NOTE — Assessment & Plan Note (Signed)
 bp in fair control at this time  BP Readings from Last 1 Encounters:  09/11/24 128/80   No changes needed Most recent labs reviewed  Disc lifstyle change with low sodium diet and exercise  Lower blood pressure with weight loss Down to benicar  5 mg daily

## 2024-09-11 NOTE — Patient Instructions (Signed)
 Try to avoid sodium as much as you can   Make effort to get more produce when you can   Labs today (kidney, liver)   Keep up a good fluid intake

## 2024-09-11 NOTE — Assessment & Plan Note (Signed)
 Hypothyroidism  Pt has no clinical changes, but notes more pedal edema  No change in energy level/ hair or skin/ edema and no tremor Lab Results  Component Value Date   TSH 3.04 05/20/2024    Continues levothyroxine  75 mcg daily from endocrinology  Re check TSH today

## 2024-09-11 NOTE — Assessment & Plan Note (Signed)
 Has not needed cpap since weight loss Sleep is more restful

## 2024-09-11 NOTE — Assessment & Plan Note (Signed)
 With macular edema Glaucoma suspect

## 2024-09-11 NOTE — Assessment & Plan Note (Signed)
 Given handout re:  DASH Mediterranean diets   Working on reducing sodium/ processed foods

## 2024-09-13 ENCOUNTER — Ambulatory Visit: Payer: Self-pay | Admitting: Family Medicine

## 2024-09-15 ENCOUNTER — Ambulatory Visit: Admitting: Family Medicine

## 2024-09-23 LAB — GENECONNECT MOLECULAR SCREEN: Genetic Analysis Overall Interpretation: NEGATIVE

## 2024-12-07 ENCOUNTER — Ambulatory Visit: Admitting: Internal Medicine
